# Patient Record
Sex: Female | Born: 1955
Health system: Southern US, Community
[De-identification: ages and names within clinical notes are randomized; demographics above are authoritative.]

## PROBLEM LIST (undated history)

## (undated) DIAGNOSIS — E785 Hyperlipidemia, unspecified: Secondary | ICD-10-CM

## (undated) DIAGNOSIS — F329 Major depressive disorder, single episode, unspecified: Secondary | ICD-10-CM

## (undated) DIAGNOSIS — E119 Type 2 diabetes mellitus without complications: Secondary | ICD-10-CM

## (undated) DIAGNOSIS — F32A Depression, unspecified: Secondary | ICD-10-CM

## (undated) DIAGNOSIS — C50911 Malignant neoplasm of unspecified site of right female breast: Secondary | ICD-10-CM

## (undated) DIAGNOSIS — I10 Essential (primary) hypertension: Secondary | ICD-10-CM

## (undated) DIAGNOSIS — E669 Obesity, unspecified: Secondary | ICD-10-CM

## (undated) DIAGNOSIS — T8859XA Other complications of anesthesia, initial encounter: Secondary | ICD-10-CM

## (undated) DIAGNOSIS — R569 Unspecified convulsions: Secondary | ICD-10-CM

## (undated) DIAGNOSIS — G43909 Migraine, unspecified, not intractable, without status migrainosus: Secondary | ICD-10-CM

## (undated) DIAGNOSIS — C50919 Malignant neoplasm of unspecified site of unspecified female breast: Secondary | ICD-10-CM

## (undated) DIAGNOSIS — K219 Gastro-esophageal reflux disease without esophagitis: Secondary | ICD-10-CM

## (undated) DIAGNOSIS — N189 Chronic kidney disease, unspecified: Secondary | ICD-10-CM

## (undated) HISTORY — DX: Essential (primary) hypertension: I10

## (undated) HISTORY — PX: ABDOMINAL HYSTERECTOMY: SHX81

## (undated) HISTORY — DX: Type 2 diabetes mellitus without complications: E11.9

## (undated) HISTORY — PX: BREAST LUMPECTOMY: SHX2

## (undated) HISTORY — DX: Obesity, unspecified: E66.9

## (undated) HISTORY — PX: FOOT SURGERY: SHX648

## (undated) HISTORY — PX: MASTECTOMY: SHX3

## (undated) HISTORY — DX: Malignant neoplasm of unspecified site of right female breast: C50.911

## (undated) HISTORY — DX: Hyperlipidemia, unspecified: E78.5

## (undated) HISTORY — PX: OTHER SURGICAL HISTORY: SHX169

## (undated) HISTORY — PX: CYST EXCISION: SHX5701

## (undated) HISTORY — DX: Chronic kidney disease, unspecified: N18.9

---

## 2007-11-30 ENCOUNTER — Encounter: Admission: RE | Admit: 2007-11-30 | Discharge: 2007-11-30 | Payer: Self-pay | Admitting: Internal Medicine

## 2007-12-11 ENCOUNTER — Encounter (INDEPENDENT_AMBULATORY_CARE_PROVIDER_SITE_OTHER): Payer: Self-pay | Admitting: Diagnostic Radiology

## 2007-12-11 ENCOUNTER — Encounter: Admission: RE | Admit: 2007-12-11 | Discharge: 2007-12-11 | Payer: Self-pay | Admitting: Internal Medicine

## 2007-12-18 DIAGNOSIS — Z853 Personal history of malignant neoplasm of breast: Secondary | ICD-10-CM | POA: Insufficient documentation

## 2007-12-18 DIAGNOSIS — Z170 Estrogen receptor positive status [ER+]: Secondary | ICD-10-CM | POA: Insufficient documentation

## 2007-12-18 DIAGNOSIS — C50511 Malignant neoplasm of lower-outer quadrant of right female breast: Secondary | ICD-10-CM | POA: Insufficient documentation

## 2007-12-18 DIAGNOSIS — C50911 Malignant neoplasm of unspecified site of right female breast: Secondary | ICD-10-CM

## 2007-12-18 HISTORY — DX: Malignant neoplasm of unspecified site of right female breast: C50.911

## 2007-12-21 ENCOUNTER — Encounter: Admission: RE | Admit: 2007-12-21 | Discharge: 2007-12-21 | Payer: Self-pay | Admitting: Internal Medicine

## 2008-01-08 ENCOUNTER — Encounter: Admission: RE | Admit: 2008-01-08 | Discharge: 2008-01-08 | Payer: Self-pay | Admitting: Surgery

## 2008-01-08 ENCOUNTER — Ambulatory Visit (HOSPITAL_COMMUNITY): Admission: RE | Admit: 2008-01-08 | Discharge: 2008-01-08 | Payer: Self-pay | Admitting: Surgery

## 2008-01-08 ENCOUNTER — Encounter (INDEPENDENT_AMBULATORY_CARE_PROVIDER_SITE_OTHER): Payer: Self-pay | Admitting: Surgery

## 2008-01-14 ENCOUNTER — Ambulatory Visit: Payer: Self-pay | Admitting: Oncology

## 2008-01-23 LAB — CBC WITH DIFFERENTIAL/PLATELET
BASO%: 0.8 % (ref 0.0–2.0)
Basophils Absolute: 0 10*3/uL (ref 0.0–0.1)
EOS%: 2.4 % (ref 0.0–7.0)
Eosinophils Absolute: 0.1 10*3/uL (ref 0.0–0.5)
HCT: 36.9 % (ref 34.8–46.6)
HGB: 13 g/dL (ref 11.6–15.9)
LYMPH%: 29.8 % (ref 14.0–48.0)
MCH: 29.9 pg (ref 26.0–34.0)
MCHC: 35.3 g/dL (ref 32.0–36.0)
MCV: 84.8 fL (ref 81.0–101.0)
MONO#: 0.2 10*3/uL (ref 0.1–0.9)
MONO%: 3.9 % (ref 0.0–13.0)
NEUT#: 2.7 10*3/uL (ref 1.5–6.5)
NEUT%: 63.1 % (ref 39.6–76.8)
Platelets: 266 10*3/uL (ref 145–400)
RBC: 4.36 10*6/uL (ref 3.70–5.32)
RDW: 12.9 % (ref 11.3–14.5)
WBC: 4.2 10*3/uL (ref 3.9–10.0)
lymph#: 1.3 10*3/uL (ref 0.9–3.3)

## 2008-01-24 LAB — COMPREHENSIVE METABOLIC PANEL
ALT: 13 U/L (ref 0–35)
AST: 11 U/L (ref 0–37)
Albumin: 4 g/dL (ref 3.5–5.2)
Alkaline Phosphatase: 67 U/L (ref 39–117)
BUN: 14 mg/dL (ref 6–23)
CO2: 23 mEq/L (ref 19–32)
Calcium: 8.9 mg/dL (ref 8.4–10.5)
Chloride: 108 mEq/L (ref 96–112)
Creatinine, Ser: 1 mg/dL (ref 0.40–1.20)
Glucose, Bld: 142 mg/dL — ABNORMAL HIGH (ref 70–99)
Potassium: 3.9 mEq/L (ref 3.5–5.3)
Sodium: 141 mEq/L (ref 135–145)
Total Bilirubin: 0.7 mg/dL (ref 0.3–1.2)
Total Protein: 7 g/dL (ref 6.0–8.3)

## 2008-01-24 LAB — CANCER ANTIGEN 27.29: CA 27.29: 30 U/mL (ref 0–39)

## 2008-01-24 LAB — LACTATE DEHYDROGENASE: LDH: 129 U/L (ref 94–250)

## 2008-01-24 LAB — VITAMIN D 25 HYDROXY (VIT D DEFICIENCY, FRACTURES): Vit D, 25-Hydroxy: 22 ng/mL — ABNORMAL LOW (ref 30–89)

## 2008-01-30 ENCOUNTER — Encounter: Admission: RE | Admit: 2008-01-30 | Discharge: 2008-01-30 | Payer: Self-pay | Admitting: Oncology

## 2008-02-05 ENCOUNTER — Ambulatory Visit (HOSPITAL_COMMUNITY): Admission: RE | Admit: 2008-02-05 | Discharge: 2008-02-05 | Payer: Self-pay | Admitting: Oncology

## 2008-02-12 ENCOUNTER — Ambulatory Visit: Admission: RE | Admit: 2008-02-12 | Discharge: 2008-04-29 | Payer: Self-pay | Admitting: Radiation Oncology

## 2008-04-15 ENCOUNTER — Ambulatory Visit: Payer: Self-pay | Admitting: Oncology

## 2008-04-18 LAB — CBC WITH DIFFERENTIAL/PLATELET
BASO%: 2.5 % — ABNORMAL HIGH (ref 0.0–2.0)
Basophils Absolute: 0.1 10*3/uL (ref 0.0–0.1)
EOS%: 3.9 % (ref 0.0–7.0)
Eosinophils Absolute: 0.1 10*3/uL (ref 0.0–0.5)
HCT: 38.7 % (ref 34.8–46.6)
HGB: 13.4 g/dL (ref 11.6–15.9)
LYMPH%: 33.3 % (ref 14.0–49.7)
MCH: 29.8 pg (ref 25.1–34.0)
MCHC: 34.5 g/dL (ref 31.5–36.0)
MCV: 86.1 fL (ref 79.5–101.0)
MONO#: 0.3 10*3/uL (ref 0.1–0.9)
MONO%: 8.2 % (ref 0.0–14.0)
NEUT#: 1.7 10*3/uL (ref 1.5–6.5)
NEUT%: 52.1 % (ref 38.4–76.8)
Platelets: 226 10*3/uL (ref 145–400)
RBC: 4.49 10*6/uL (ref 3.70–5.45)
RDW: 13.1 % (ref 11.2–14.5)
WBC: 3.2 10*3/uL — ABNORMAL LOW (ref 3.9–10.3)
lymph#: 1.1 10*3/uL (ref 0.9–3.3)

## 2008-04-18 LAB — COMPREHENSIVE METABOLIC PANEL
ALT: 16 U/L (ref 0–35)
AST: 15 U/L (ref 0–37)
Albumin: 4.1 g/dL (ref 3.5–5.2)
Alkaline Phosphatase: 67 U/L (ref 39–117)
BUN: 13 mg/dL (ref 6–23)
CO2: 27 mEq/L (ref 19–32)
Calcium: 9.4 mg/dL (ref 8.4–10.5)
Chloride: 104 mEq/L (ref 96–112)
Creatinine, Ser: 0.98 mg/dL (ref 0.40–1.20)
Glucose, Bld: 157 mg/dL — ABNORMAL HIGH (ref 70–99)
Potassium: 3.9 mEq/L (ref 3.5–5.3)
Sodium: 142 mEq/L (ref 135–145)
Total Bilirubin: 0.8 mg/dL (ref 0.3–1.2)
Total Protein: 7.2 g/dL (ref 6.0–8.3)

## 2008-05-22 ENCOUNTER — Encounter: Admission: RE | Admit: 2008-05-22 | Discharge: 2008-05-22 | Payer: Self-pay | Admitting: Radiation Oncology

## 2008-06-06 ENCOUNTER — Ambulatory Visit: Payer: Self-pay | Admitting: Oncology

## 2008-08-11 ENCOUNTER — Ambulatory Visit: Payer: Self-pay | Admitting: Oncology

## 2008-08-14 LAB — CBC WITH DIFFERENTIAL/PLATELET
BASO%: 0.5 % (ref 0.0–2.0)
Basophils Absolute: 0 10*3/uL (ref 0.0–0.1)
EOS%: 1.7 % (ref 0.0–7.0)
Eosinophils Absolute: 0 10*3/uL (ref 0.0–0.5)
HCT: 37.1 % (ref 34.8–46.6)
HGB: 13.1 g/dL (ref 11.6–15.9)
LYMPH%: 31.8 % (ref 14.0–49.7)
MCH: 30.1 pg (ref 25.1–34.0)
MCHC: 35.4 g/dL (ref 31.5–36.0)
MCV: 85 fL (ref 79.5–101.0)
MONO#: 0.2 10*3/uL (ref 0.1–0.9)
MONO%: 5.7 % (ref 0.0–14.0)
NEUT#: 1.6 10*3/uL (ref 1.5–6.5)
NEUT%: 60.3 % (ref 38.4–76.8)
Platelets: 236 10*3/uL (ref 145–400)
RBC: 4.36 10*6/uL (ref 3.70–5.45)
RDW: 12.5 % (ref 11.2–14.5)
WBC: 2.7 10*3/uL — ABNORMAL LOW (ref 3.9–10.3)
lymph#: 0.9 10*3/uL (ref 0.9–3.3)

## 2008-08-15 LAB — COMPREHENSIVE METABOLIC PANEL
ALT: 17 U/L (ref 0–35)
AST: 15 U/L (ref 0–37)
Albumin: 4.2 g/dL (ref 3.5–5.2)
Alkaline Phosphatase: 70 U/L (ref 39–117)
BUN: 10 mg/dL (ref 6–23)
CO2: 25 mEq/L (ref 19–32)
Calcium: 9.6 mg/dL (ref 8.4–10.5)
Chloride: 101 mEq/L (ref 96–112)
Creatinine, Ser: 1.02 mg/dL (ref 0.40–1.20)
Glucose, Bld: 197 mg/dL — ABNORMAL HIGH (ref 70–99)
Potassium: 3.8 mEq/L (ref 3.5–5.3)
Sodium: 140 mEq/L (ref 135–145)
Total Bilirubin: 1 mg/dL (ref 0.3–1.2)
Total Protein: 7 g/dL (ref 6.0–8.3)

## 2008-08-15 LAB — LACTATE DEHYDROGENASE: LDH: 160 U/L (ref 94–250)

## 2008-08-15 LAB — CANCER ANTIGEN 27.29: CA 27.29: 51 U/mL — ABNORMAL HIGH (ref 0–39)

## 2008-08-15 LAB — VITAMIN D 25 HYDROXY (VIT D DEFICIENCY, FRACTURES): Vit D, 25-Hydroxy: 30 ng/mL (ref 30–89)

## 2008-10-29 ENCOUNTER — Encounter: Admission: RE | Admit: 2008-10-29 | Discharge: 2008-10-29 | Payer: Self-pay | Admitting: Internal Medicine

## 2008-12-03 ENCOUNTER — Encounter: Admission: RE | Admit: 2008-12-03 | Discharge: 2008-12-03 | Payer: Self-pay | Admitting: Oncology

## 2008-12-16 ENCOUNTER — Ambulatory Visit: Payer: Self-pay | Admitting: Oncology

## 2008-12-17 ENCOUNTER — Emergency Department (HOSPITAL_COMMUNITY): Admission: EM | Admit: 2008-12-17 | Discharge: 2008-12-17 | Payer: Self-pay | Admitting: Family Medicine

## 2008-12-19 LAB — CBC WITH DIFFERENTIAL/PLATELET
BASO%: 0.6 % (ref 0.0–2.0)
Basophils Absolute: 0 10*3/uL (ref 0.0–0.1)
EOS%: 2.2 % (ref 0.0–7.0)
Eosinophils Absolute: 0.1 10*3/uL (ref 0.0–0.5)
HCT: 38.9 % (ref 34.8–46.6)
HGB: 13 g/dL (ref 11.6–15.9)
LYMPH%: 24.1 % (ref 14.0–49.7)
MCH: 29.2 pg (ref 25.1–34.0)
MCHC: 33.4 g/dL (ref 31.5–36.0)
MCV: 87.4 fL (ref 79.5–101.0)
MONO#: 0.3 10*3/uL (ref 0.1–0.9)
MONO%: 4.8 % (ref 0.0–14.0)
NEUT#: 3.7 10*3/uL (ref 1.5–6.5)
NEUT%: 68.3 % (ref 38.4–76.8)
Platelets: 216 10*3/uL (ref 145–400)
RBC: 4.45 10*6/uL (ref 3.70–5.45)
RDW: 13.2 % (ref 11.2–14.5)
WBC: 5.4 10*3/uL (ref 3.9–10.3)
lymph#: 1.3 10*3/uL (ref 0.9–3.3)

## 2008-12-20 LAB — LACTATE DEHYDROGENASE: LDH: 215 U/L (ref 94–250)

## 2008-12-20 LAB — COMPREHENSIVE METABOLIC PANEL
ALT: 16 U/L (ref 0–35)
AST: 19 U/L (ref 0–37)
Albumin: 4.4 g/dL (ref 3.5–5.2)
Alkaline Phosphatase: 69 U/L (ref 39–117)
BUN: 19 mg/dL (ref 6–23)
CO2: 28 mEq/L (ref 19–32)
Calcium: 9.7 mg/dL (ref 8.4–10.5)
Chloride: 101 mEq/L (ref 96–112)
Creatinine, Ser: 1.09 mg/dL (ref 0.40–1.20)
Glucose, Bld: 125 mg/dL — ABNORMAL HIGH (ref 70–99)
Potassium: 3.4 mEq/L — ABNORMAL LOW (ref 3.5–5.3)
Sodium: 140 mEq/L (ref 135–145)
Total Bilirubin: 0.8 mg/dL (ref 0.3–1.2)
Total Protein: 7.4 g/dL (ref 6.0–8.3)

## 2008-12-20 LAB — VITAMIN D 25 HYDROXY (VIT D DEFICIENCY, FRACTURES): Vit D, 25-Hydroxy: 31 ng/mL (ref 30–89)

## 2008-12-20 LAB — CANCER ANTIGEN 27.29: CA 27.29: 39 U/mL (ref 0–39)

## 2009-05-22 ENCOUNTER — Ambulatory Visit: Payer: Self-pay | Admitting: Oncology

## 2009-06-22 ENCOUNTER — Ambulatory Visit: Payer: Self-pay | Admitting: Oncology

## 2009-07-03 ENCOUNTER — Encounter: Admission: RE | Admit: 2009-07-03 | Discharge: 2009-07-03 | Payer: Self-pay | Admitting: Internal Medicine

## 2009-08-10 ENCOUNTER — Encounter: Admission: RE | Admit: 2009-08-10 | Discharge: 2009-08-10 | Payer: Self-pay | Admitting: Internal Medicine

## 2009-08-11 ENCOUNTER — Ambulatory Visit: Payer: Self-pay | Admitting: Oncology

## 2009-08-13 LAB — COMPREHENSIVE METABOLIC PANEL
ALT: 11 U/L (ref 0–35)
AST: 12 U/L (ref 0–37)
Albumin: 4.2 g/dL (ref 3.5–5.2)
Alkaline Phosphatase: 67 U/L (ref 39–117)
BUN: 20 mg/dL (ref 6–23)
CO2: 29 mEq/L (ref 19–32)
Calcium: 10.2 mg/dL (ref 8.4–10.5)
Chloride: 101 mEq/L (ref 96–112)
Creatinine, Ser: 1.11 mg/dL (ref 0.40–1.20)
Glucose, Bld: 108 mg/dL — ABNORMAL HIGH (ref 70–99)
Potassium: 4.1 mEq/L (ref 3.5–5.3)
Sodium: 141 mEq/L (ref 135–145)
Total Bilirubin: 0.8 mg/dL (ref 0.3–1.2)
Total Protein: 7.3 g/dL (ref 6.0–8.3)

## 2009-08-13 LAB — CBC WITH DIFFERENTIAL/PLATELET
BASO%: 0.9 % (ref 0.0–2.0)
Basophils Absolute: 0 10*3/uL (ref 0.0–0.1)
EOS%: 2.3 % (ref 0.0–7.0)
Eosinophils Absolute: 0.1 10*3/uL (ref 0.0–0.5)
HCT: 36.8 % (ref 34.8–46.6)
HGB: 12.8 g/dL (ref 11.6–15.9)
LYMPH%: 35.2 % (ref 14.0–49.7)
MCH: 30.2 pg (ref 25.1–34.0)
MCHC: 34.7 g/dL (ref 31.5–36.0)
MCV: 87 fL (ref 79.5–101.0)
MONO#: 0.2 10*3/uL (ref 0.1–0.9)
MONO%: 6 % (ref 0.0–14.0)
NEUT#: 2.3 10*3/uL (ref 1.5–6.5)
NEUT%: 55.6 % (ref 38.4–76.8)
Platelets: 271 10*3/uL (ref 145–400)
RBC: 4.24 10*6/uL (ref 3.70–5.45)
RDW: 13.8 % (ref 11.2–14.5)
WBC: 4.2 10*3/uL (ref 3.9–10.3)
lymph#: 1.5 10*3/uL (ref 0.9–3.3)

## 2009-08-13 LAB — LACTATE DEHYDROGENASE: LDH: 151 U/L (ref 94–250)

## 2009-08-13 LAB — CANCER ANTIGEN 27.29: CA 27.29: 38 U/mL (ref 0–39)

## 2009-08-13 LAB — VITAMIN D 25 HYDROXY (VIT D DEFICIENCY, FRACTURES): Vit D, 25-Hydroxy: 43 ng/mL (ref 30–89)

## 2009-08-18 ENCOUNTER — Ambulatory Visit (HOSPITAL_COMMUNITY): Admission: RE | Admit: 2009-08-18 | Discharge: 2009-08-18 | Payer: Self-pay | Admitting: Oncology

## 2009-08-18 ENCOUNTER — Encounter: Admission: RE | Admit: 2009-08-18 | Discharge: 2009-08-18 | Payer: Self-pay | Admitting: Oncology

## 2010-04-27 ENCOUNTER — Other Ambulatory Visit: Payer: Self-pay | Admitting: Internal Medicine

## 2010-04-27 DIAGNOSIS — Z853 Personal history of malignant neoplasm of breast: Secondary | ICD-10-CM

## 2010-04-27 DIAGNOSIS — N644 Mastodynia: Secondary | ICD-10-CM

## 2010-04-27 DIAGNOSIS — Z1231 Encounter for screening mammogram for malignant neoplasm of breast: Secondary | ICD-10-CM

## 2010-04-29 ENCOUNTER — Other Ambulatory Visit: Payer: Self-pay | Admitting: Internal Medicine

## 2010-04-29 DIAGNOSIS — N644 Mastodynia: Secondary | ICD-10-CM

## 2010-04-29 DIAGNOSIS — Z853 Personal history of malignant neoplasm of breast: Secondary | ICD-10-CM

## 2010-05-05 ENCOUNTER — Ambulatory Visit
Admission: RE | Admit: 2010-05-05 | Discharge: 2010-05-05 | Disposition: A | Discharge status: Home / Self Care | Payer: BC Managed Care – PPO | Source: Ambulatory Visit | Attending: Internal Medicine | Admitting: Internal Medicine

## 2010-05-05 ENCOUNTER — Other Ambulatory Visit: Payer: Self-pay | Admitting: Internal Medicine

## 2010-05-05 DIAGNOSIS — N644 Mastodynia: Secondary | ICD-10-CM

## 2010-05-05 DIAGNOSIS — Z853 Personal history of malignant neoplasm of breast: Secondary | ICD-10-CM

## 2010-06-01 NOTE — Op Note (Signed)
[name], [name]            ACCOUNT NO.:  [id number]   MEDICAL RECORD NO.:  [id number]          PATIENT TYPE:  AMB   LOCATION:  SDS                          FACILITY:  [facility]   PHYSICIAN:  Sandria Bales. Ezzard Standing, M.D.  DATE OF BIRTH:  07-30-1955   DATE OF PROCEDURE:  01/08/2008  DATE OF DISCHARGE:  01/08/2008                               OPERATIVE REPORT   Date of Surgery ??   PREOPERATIVE DIAGNOSIS:  Right breast carcinoma approximately 8-9  o'clock position at the edge of the areola.   POSTOPERATIVE DIAGNOSIS:  Right breast carcinoma, 8-9 o'clock position  at edge of the areola, negative sentinel lymph node.   PROCEDURE:  Injection of methylene blue, right axillary sentinel lymph  node biopsy, needle localization, right breast lumpectomy.   SURGEON:  Sandria Bales. Ezzard Standing, MD   FIRST ASSISTANT:  None.   ANESTHESIA:  General endotracheal.   ESTIMATED BLOOD LOSS:  100 mL.   DRAINS:  Drains left in were none.   INDICATIONS FOR PROCEDURE:  Carrie Singh is a 55 year old white female,  patient of Dr. Dorothyann Singh, who had a large core biopsy of a mass on  the right breast, which proved to show an infiltrating ductal carcinoma.  This was done by Dr. Cain Singh.   I discussed with the patient the options for treatment, which include  lumpectomy versus mastectomy.  I think she is a candidate for a  lumpectomy.  I discussed with her the indications and potential  complications.  Potential complications include, but not limited to,  bleeding, infection, recurrence of the tumor, and the need for wider  excision.   OPERATIVE NOTE:  The patient had a wire placed in her right breast for  wire localization.  This was done at the breast center.  Her wire came  out at about 6 o'clock position.   She underwent an injection of 1 mCi of technetium sulfur colloid in the  periareolar area.  She then presented to the operating room where breast  was prepped with chlorhexidine and sterilely  draped and then I injected  about 1 mL of 40% methylene blue in a subareolar fashion.   A time-out was held to identifying the patient and procedure.  I had  marked her right breast already.  Lower on the right axilla.  I made an  incision lower in the right axilla.  I got down, I found  a warm node,  counts were about 5 to 8 with a background of 0, and the node was not  blue.  I sent this off for a touch prep of the lymph node was negative.  She had no other palpable adenopathy or abnormality within her axilla.  I did take out a second piece of axillary fat that may have a lymph node  I sent it out as a separate specimen.   Then turned my attention to the right breast as particularly.  Again,  the wire came out at the right breast, but the mass was palpable off the  edge of the areola at about the 8-9 o'clock position.  So, I cut  down  and tried to excise a block of breast tissue around this mass.  I went  down to the chest wall.  I did mark the specimen with the ink kit to  orient the anterior and posterior and lateral wall margins.  The wire  emanated from the inferior aspect of this mass.  Dr. Anselmo Singh  called me and said that the specimen appeared to be within the breast  tissue mass for an adequate lumpectomy.   I then irrigated the wound with saline, closed the subcutaneous tissues  with 3-0 Vicryl suture, the skin with a 5-0 Monocryl suture.  I did the  same with the right axilla.  I did use 30 mL of 0.25% Marcaine as a  local anesthetic.   The patient will be discharged home today, return to see me in 2 weeks  for followup of her pathology.      Sandria Bales. Ezzard Standing, M.D.  Electronically Signed     DHN/MEDQ  D:  01/08/2008  T:  01/09/2008  Job:  [id number]   cc:   Carrie Singh, M.D.  Carrie Singh, M.D.

## 2010-10-22 LAB — COMPREHENSIVE METABOLIC PANEL
ALT: 17 U/L (ref 0–35)
AST: 20 U/L (ref 0–37)
Albumin: 3.9 g/dL (ref 3.5–5.2)
Alkaline Phosphatase: 73 U/L (ref 39–117)
BUN: 17 mg/dL (ref 6–23)
CO2: 34 mEq/L — ABNORMAL HIGH (ref 19–32)
Calcium: 9.5 mg/dL (ref 8.4–10.5)
Chloride: 100 mEq/L (ref 96–112)
Creatinine, Ser: 0.97 mg/dL (ref 0.4–1.2)
GFR calc Af Amer: >60 mL/min (ref >60)
GFR calc non Af Amer: 60 mL/min — ABNORMAL LOW (ref >60)
Glucose, Bld: 165 mg/dL — ABNORMAL HIGH (ref 70–99)
Potassium: 3.8 mEq/L (ref 3.5–5.1)
Sodium: 137 mEq/L (ref 135–145)
Total Bilirubin: 1.2 mg/dL (ref 0.3–1.2)
Total Protein: 7.4 g/dL (ref 6.0–8.3)

## 2010-10-22 LAB — DIFFERENTIAL
Basophils Absolute: 0 10*3/uL (ref 0.0–0.1)
Basophils Relative: 0 % (ref 0–1)
Eosinophils Absolute: 0.1 10*3/uL (ref 0.0–0.7)
Eosinophils Relative: 2 % (ref 0–5)
Lymphocytes Relative: 31 % (ref 12–46)
Lymphs Abs: 1.6 10*3/uL (ref 0.7–4.0)
Monocytes Absolute: 0.3 10*3/uL (ref 0.1–1.0)
Monocytes Relative: 5 % (ref 3–12)
Neutro Abs: 3.2 10*3/uL (ref 1.7–7.7)
Neutrophils Relative %: 62 % (ref 43–77)

## 2010-10-22 LAB — CBC
HCT: 39.5 % (ref 36.0–46.0)
Hemoglobin: 13.4 g/dL (ref 12.0–15.0)
MCHC: 33.9 g/dL (ref 30.0–36.0)
MCV: 86.8 fL (ref 78.0–100.0)
Platelets: 282 10*3/uL (ref 150–400)
RBC: 4.55 MIL/uL (ref 3.87–5.11)
RDW: 12.9 % (ref 11.5–15.5)
WBC: 5.2 10*3/uL (ref 4.0–10.5)

## 2010-10-22 LAB — LACTATE DEHYDROGENASE: LDH: 165 U/L (ref 94–250)

## 2010-11-01 ENCOUNTER — Other Ambulatory Visit: Payer: Self-pay | Admitting: Internal Medicine

## 2010-11-01 DIAGNOSIS — Z9889 Other specified postprocedural states: Secondary | ICD-10-CM

## 2010-11-01 DIAGNOSIS — C50919 Malignant neoplasm of unspecified site of unspecified female breast: Secondary | ICD-10-CM

## 2010-12-13 ENCOUNTER — Ambulatory Visit
Admission: RE | Admit: 2010-12-13 | Discharge: 2010-12-13 | Disposition: A | Discharge status: Home / Self Care | Payer: BC Managed Care – PPO | Source: Ambulatory Visit | Attending: Internal Medicine | Admitting: Internal Medicine

## 2010-12-13 DIAGNOSIS — C50919 Malignant neoplasm of unspecified site of unspecified female breast: Secondary | ICD-10-CM

## 2010-12-13 DIAGNOSIS — Z9889 Other specified postprocedural states: Secondary | ICD-10-CM

## 2010-12-23 ENCOUNTER — Other Ambulatory Visit: Payer: Self-pay | Admitting: Oncology

## 2010-12-23 DIAGNOSIS — C50419 Malignant neoplasm of upper-outer quadrant of unspecified female breast: Secondary | ICD-10-CM

## 2010-12-24 ENCOUNTER — Telehealth: Payer: Self-pay | Admitting: *Deleted

## 2010-12-24 ENCOUNTER — Other Ambulatory Visit: Payer: Self-pay | Admitting: Physician Assistant

## 2010-12-24 DIAGNOSIS — C50919 Malignant neoplasm of unspecified site of unspecified female breast: Secondary | ICD-10-CM

## 2010-12-24 NOTE — Telephone Encounter (Signed)
left voice message to inform the patient of the new date and time on 12-29-2010 starting at 3:00pm

## 2010-12-24 NOTE — Telephone Encounter (Signed)
patient called back and stated she called in about a month ago and requested a physician change patient does not want to come back and see dr.rubin

## 2010-12-29 ENCOUNTER — Other Ambulatory Visit: Payer: BC Managed Care – PPO | Admitting: Lab

## 2010-12-29 ENCOUNTER — Ambulatory Visit: Payer: BC Managed Care – PPO | Admitting: Oncology

## 2011-02-09 LAB — HM COLONOSCOPY

## 2011-03-17 ENCOUNTER — Other Ambulatory Visit: Payer: Self-pay | Admitting: Physician Assistant

## 2011-09-02 ENCOUNTER — Other Ambulatory Visit: Payer: Self-pay | Admitting: Internal Medicine

## 2011-09-12 ENCOUNTER — Ambulatory Visit
Admission: RE | Admit: 2011-09-12 | Discharge: 2011-09-12 | Disposition: A | Discharge status: Home / Self Care | Payer: BC Managed Care – PPO | Source: Ambulatory Visit | Attending: Internal Medicine | Admitting: Internal Medicine

## 2011-09-12 ENCOUNTER — Other Ambulatory Visit: Payer: Self-pay | Admitting: Internal Medicine

## 2011-09-12 DIAGNOSIS — N6310 Unspecified lump in the right breast, unspecified quadrant: Secondary | ICD-10-CM

## 2011-09-12 DIAGNOSIS — N644 Mastodynia: Secondary | ICD-10-CM

## 2011-09-14 ENCOUNTER — Other Ambulatory Visit: Payer: BC Managed Care – PPO

## 2011-12-09 ENCOUNTER — Other Ambulatory Visit: Payer: Self-pay | Admitting: Internal Medicine

## 2011-12-09 DIAGNOSIS — Z853 Personal history of malignant neoplasm of breast: Secondary | ICD-10-CM

## 2011-12-20 ENCOUNTER — Ambulatory Visit
Admission: RE | Admit: 2011-12-20 | Discharge: 2011-12-20 | Disposition: A | Discharge status: Home / Self Care | Payer: BC Managed Care – PPO | Source: Ambulatory Visit | Attending: Internal Medicine | Admitting: Internal Medicine

## 2011-12-20 DIAGNOSIS — Z853 Personal history of malignant neoplasm of breast: Secondary | ICD-10-CM

## 2012-06-27 ENCOUNTER — Other Ambulatory Visit: Payer: Self-pay | Admitting: Nurse Practitioner

## 2012-06-27 DIAGNOSIS — N2889 Other specified disorders of kidney and ureter: Secondary | ICD-10-CM

## 2012-07-06 ENCOUNTER — Ambulatory Visit
Admission: RE | Admit: 2012-07-06 | Discharge: 2012-07-06 | Disposition: A | Discharge status: Home / Self Care | Payer: BC Managed Care – PPO | Source: Ambulatory Visit | Attending: Nurse Practitioner | Admitting: Nurse Practitioner

## 2012-07-06 DIAGNOSIS — N2889 Other specified disorders of kidney and ureter: Secondary | ICD-10-CM

## 2012-07-06 MED ORDER — GADOBENATE DIMEGLUMINE 529 MG/ML IV SOLN
20.0000 mL | Freq: Once | INTRAVENOUS | Status: AC | PRN
  Administered 2012-07-06: 20 mL via INTRAVENOUS

## 2012-11-16 ENCOUNTER — Other Ambulatory Visit: Payer: Self-pay

## 2012-11-16 ENCOUNTER — Other Ambulatory Visit: Payer: Self-pay | Admitting: Internal Medicine

## 2012-11-16 DIAGNOSIS — Z853 Personal history of malignant neoplasm of breast: Secondary | ICD-10-CM

## 2012-12-06 ENCOUNTER — Ambulatory Visit (INDEPENDENT_AMBULATORY_CARE_PROVIDER_SITE_OTHER): Payer: BC Managed Care – PPO | Admitting: Podiatry

## 2012-12-06 ENCOUNTER — Encounter: Payer: Self-pay | Admitting: Podiatry

## 2012-12-06 ENCOUNTER — Ambulatory Visit (INDEPENDENT_AMBULATORY_CARE_PROVIDER_SITE_OTHER): Payer: BC Managed Care – PPO

## 2012-12-06 VITALS — BP 162/95 | HR 86 | Resp 16 | Ht 65.0 in

## 2012-12-06 DIAGNOSIS — M25579 Pain in unspecified ankle and joints of unspecified foot: Secondary | ICD-10-CM

## 2012-12-06 DIAGNOSIS — M79609 Pain in unspecified limb: Secondary | ICD-10-CM

## 2012-12-06 DIAGNOSIS — M79671 Pain in right foot: Secondary | ICD-10-CM

## 2012-12-06 DIAGNOSIS — M25572 Pain in left ankle and joints of left foot: Secondary | ICD-10-CM

## 2012-12-06 DIAGNOSIS — M79672 Pain in left foot: Secondary | ICD-10-CM

## 2012-12-06 DIAGNOSIS — M779 Enthesopathy, unspecified: Secondary | ICD-10-CM

## 2012-12-06 DIAGNOSIS — S93409A Sprain of unspecified ligament of unspecified ankle, initial encounter: Secondary | ICD-10-CM

## 2012-12-06 MED ORDER — TRIAMCINOLONE ACETONIDE 10 MG/ML IJ SUSP
5.0000 mg | Freq: Once | INTRAMUSCULAR | Status: AC
  Administered 2012-12-06: 5 mg via INTRA_ARTICULAR

## 2012-12-06 NOTE — Patient Instructions (Signed)
Diabetes and Foot Care Diabetes may cause you to have problems because of poor blood supply (circulation) to your feet and legs. This may cause the skin on your feet to become thinner, break easier, and heal more slowly. Your skin may become dry, and the skin may peel and crack. You may also have nerve damage in your legs and feet causing decreased feeling in them. You may not notice minor injuries to your feet that could lead to infections or more serious problems. Taking care of your feet is one of the most important things you can do for yourself.  HOME CARE INSTRUCTIONS  Wear shoes at all times, even in the house. Do not go barefoot. Bare feet are easily injured.  Check your feet daily for blisters, cuts, and redness. If you cannot see the bottom of your feet, use a mirror or ask someone for help.  Wash your feet with warm water (do not use hot water) and mild soap. Then pat your feet and the areas between your toes until they are completely dry. Do not soak your feet as this can dry your skin.  Apply a moisturizing lotion or petroleum jelly (that does not contain alcohol and is unscented) to the skin on your feet and to dry, brittle toenails. Do not apply lotion between your toes.  Trim your toenails straight across. Do not dig under them or around the cuticle. File the edges of your nails with an emery board or nail file.  Do not cut corns or calluses or try to remove them with medicine.  Wear clean socks or stockings every day. Make sure they are not too tight. Do not wear knee-high stockings since they may decrease blood flow to your legs.  Wear shoes that fit properly and have enough cushioning. To break in new shoes, wear them for just a few hours a day. This prevents you from injuring your feet. Always look in your shoes before you put them on to be sure there are no objects inside.  Do not cross your legs. This may decrease the blood flow to your feet.  If you find a minor scrape,  cut, or break in the skin on your feet, keep it and the skin around it clean and dry. These areas may be cleansed with mild soap and water. Do not cleanse the area with peroxide, alcohol, or iodine.  When you remove an adhesive bandage, be sure not to damage the skin around it.  If you have a wound, look at it several times a day to make sure it is healing.  Do not use heating pads or hot water bottles. They may burn your skin. If you have lost feeling in your feet or legs, you may not know it is happening until it is too late.  Make sure your health care provider performs a complete foot exam at least annually or more often if you have foot problems. Report any cuts, sores, or bruises to your health care provider immediately. SEEK MEDICAL CARE IF:   You have an injury that is not healing.  You have cuts or breaks in the skin.  You have an ingrown nail.  You notice redness on your legs or feet.  You feel burning or tingling in your legs or feet.  You have pain or cramps in your legs and feet.  Your legs or feet are numb.  Your feet always feel cold. SEEK IMMEDIATE MEDICAL CARE IF:   There is increasing redness,   swelling, or pain in or around a wound.  There is a red line that goes up your leg.  Pus is coming from a wound.  You develop a fever or as directed by your health care provider.  You notice a bad smell coming from an ulcer or wound. Document Released: 01/01/2000 Document Revised: 09/05/2012 Document Reviewed: 06/12/2012 [employer] Patient Information 2014 [employer].  

## 2012-12-06 NOTE — Progress Notes (Signed)
Subjective:     Patient ID: Carrie Singh, female   DOB: [birth year], 57 y.o.   MRN: [id number]  Foot Pain  Toe Pain    patient presents stating him having a lot of pain in my left ankle and my right foot has been somewhat sore not like the left at this time. Patient states she's tried exercise to keep her diabetes under good control and complaint her right big toe has been somewhat not   Review of Systems  All other systems reviewed and are negative.       Objective:   Physical Exam  Nursing note and vitals reviewed. Constitutional: She is oriented to person, place, and time.  Cardiovascular: Intact distal pulses.   Musculoskeletal: Normal range of motion.  Neurological: She is oriented to person, place, and time.  Skin: Skin is warm.   patient has pain in the sinus tarsi left and into the lateral ankle gutter. Neurovascular status intact with muscle strength adequate and no equinus condition noted did not note any pathology with the right big toe     Assessment:     Sinus tarsitis left with inflammation of the ankle and probable ankle sprain. Mild numbness right big toe with no organic cause    Plan:     H&P and x-rays reviewed of foot ankle left foot right. Injected the left sinus tarsi 3 minus Kenalog 5 mg Xylocaine Marcaine mixture and dispensed tri-lock ankle brace to support the ankle and reappoint as needed

## 2012-12-10 ENCOUNTER — Ambulatory Visit: Payer: Self-pay | Admitting: Podiatry

## 2012-12-20 ENCOUNTER — Encounter: Payer: Self-pay | Admitting: Podiatry

## 2012-12-20 ENCOUNTER — Ambulatory Visit (INDEPENDENT_AMBULATORY_CARE_PROVIDER_SITE_OTHER): Payer: BC Managed Care – PPO | Admitting: Podiatry

## 2012-12-20 ENCOUNTER — Ambulatory Visit
Admission: RE | Admit: 2012-12-20 | Discharge: 2012-12-20 | Disposition: A | Discharge status: Home / Self Care | Payer: BC Managed Care – PPO | Source: Ambulatory Visit | Attending: Internal Medicine | Admitting: Internal Medicine

## 2012-12-20 ENCOUNTER — Ambulatory Visit: Payer: Self-pay | Admitting: Podiatry

## 2012-12-20 VITALS — BP 157/99 | HR 85 | Resp 16

## 2012-12-20 DIAGNOSIS — Z853 Personal history of malignant neoplasm of breast: Secondary | ICD-10-CM

## 2012-12-20 DIAGNOSIS — M779 Enthesopathy, unspecified: Secondary | ICD-10-CM

## 2012-12-20 NOTE — Progress Notes (Signed)
Subjective:     Patient ID: Carrie Singh, female   DOB: September 05, 1955, 57 y.o.   MRN: [id number]  HPI patient states is doing much better my left foot and able to walk with the brace   Review of Systems     Objective:   Physical Exam    neurovascular status intact with no changes noted and discomfort of the left sinus tarsi which has improved quite a bit from previous visit Assessment:    has improved quite a bit from previous visit    improve sinus tarsitis left with brace being helpful Plan:     Advised him reduced brace and gradually increasing the strength of the ankle and continue ambulation as she has been doing. Reappoint as needed

## 2013-04-04 ENCOUNTER — Ambulatory Visit: Payer: BC Managed Care – PPO | Admitting: Family Medicine

## 2013-04-24 ENCOUNTER — Ambulatory Visit (INDEPENDENT_AMBULATORY_CARE_PROVIDER_SITE_OTHER): Payer: BC Managed Care – PPO | Admitting: Internal Medicine

## 2013-04-24 ENCOUNTER — Telehealth: Payer: Self-pay | Admitting: Internal Medicine

## 2013-04-24 ENCOUNTER — Encounter: Payer: Self-pay | Admitting: Internal Medicine

## 2013-04-24 VITALS — BP 136/88 | HR 72 | Temp 98.1°F | Ht 65.25 in | Wt 217.2 lb

## 2013-04-24 DIAGNOSIS — E049 Nontoxic goiter, unspecified: Secondary | ICD-10-CM

## 2013-04-24 DIAGNOSIS — E119 Type 2 diabetes mellitus without complications: Secondary | ICD-10-CM | POA: Insufficient documentation

## 2013-04-24 DIAGNOSIS — I10 Essential (primary) hypertension: Secondary | ICD-10-CM

## 2013-04-24 DIAGNOSIS — F329 Major depressive disorder, single episode, unspecified: Secondary | ICD-10-CM

## 2013-04-24 DIAGNOSIS — E669 Obesity, unspecified: Secondary | ICD-10-CM | POA: Insufficient documentation

## 2013-04-24 DIAGNOSIS — F3289 Other specified depressive episodes: Secondary | ICD-10-CM

## 2013-04-24 DIAGNOSIS — E010 Iodine-deficiency related diffuse (endemic) goiter: Secondary | ICD-10-CM

## 2013-04-24 DIAGNOSIS — E785 Hyperlipidemia, unspecified: Secondary | ICD-10-CM

## 2013-04-24 DIAGNOSIS — E1169 Type 2 diabetes mellitus with other specified complication: Secondary | ICD-10-CM | POA: Insufficient documentation

## 2013-04-24 DIAGNOSIS — F32A Depression, unspecified: Secondary | ICD-10-CM

## 2013-04-24 LAB — MICROALBUMIN / CREATININE URINE RATIO
Creatinine,U: 39.5 mg/dL
Microalb Creat Ratio: 0.5 mg/g (ref 0.0–30.0)
Microalb, Ur: 0.2 mg/dL (ref 0.0–1.9)

## 2013-04-24 LAB — COMPREHENSIVE METABOLIC PANEL
ALT: 19 U/L (ref 0–35)
AST: 16 U/L (ref 0–37)
Albumin: 3.9 g/dL (ref 3.5–5.2)
Alkaline Phosphatase: 70 U/L (ref 39–117)
BUN: 16 mg/dL (ref 6–23)
CO2: 30 mEq/L (ref 19–32)
Calcium: 9.2 mg/dL (ref 8.4–10.5)
Chloride: 100 mEq/L (ref 96–112)
Creatinine, Ser: 1 mg/dL (ref 0.4–1.2)
GFR: 76.25 mL/min (ref >60.00)
Glucose, Bld: 120 mg/dL — ABNORMAL HIGH (ref 70–99)
Potassium: 3.9 mEq/L (ref 3.5–5.1)
Sodium: 136 mEq/L (ref 135–145)
Total Bilirubin: 0.9 mg/dL (ref 0.3–1.2)
Total Protein: 7.2 g/dL (ref 6.0–8.3)

## 2013-04-24 LAB — LIPID PANEL
Cholesterol: 239 mg/dL — ABNORMAL HIGH (ref 0–200)
HDL: 52.5 mg/dL (ref >39.00)
LDL Cholesterol: 148 mg/dL — ABNORMAL HIGH (ref 0–99)
Total CHOL/HDL Ratio: 5
Triglycerides: 195 mg/dL — ABNORMAL HIGH (ref 0.0–149.0)
VLDL: 39 mg/dL (ref 0.0–40.0)

## 2013-04-24 LAB — CBC
HCT: 38.8 % (ref 36.0–46.0)
Hemoglobin: 13.2 g/dL (ref 12.0–15.0)
MCHC: 34 g/dL (ref 30.0–36.0)
MCV: 85.4 fl (ref 78.0–100.0)
Platelets: 273 10*3/uL (ref 150.0–400.0)
RBC: 4.54 Mil/uL (ref 3.87–5.11)
RDW: 13.8 % (ref 11.5–14.6)
WBC: 4.7 10*3/uL (ref 4.5–10.5)

## 2013-04-24 LAB — HEMOGLOBIN A1C: Hgb A1c MFr Bld: 7.8 % — ABNORMAL HIGH (ref 4.6–6.5)

## 2013-04-24 LAB — TSH: TSH: 1.67 u[IU]/mL (ref 0.35–5.50)

## 2013-04-24 NOTE — Assessment & Plan Note (Signed)
She has been working on weight loss Will recheck A1C and microalbumin today Foot exam today She would like to switch the [place] to another drug- will check A1C first

## 2013-04-24 NOTE — Telephone Encounter (Signed)
Relevant patient education assigned to patient using Emmi. ° °

## 2013-04-24 NOTE — Telephone Encounter (Signed)
Pt is calling back and wanted to tell you her other medications with dosage. Januvia 100 mg and Pravastatin 20 mg.

## 2013-04-24 NOTE — Assessment & Plan Note (Signed)
She has been walking and watching her diet She is losing about 0.5-1 lb per week

## 2013-04-24 NOTE — Assessment & Plan Note (Signed)
Situation Support offered She does not want treatment at this time.

## 2013-04-24 NOTE — Progress Notes (Signed)
Pre visit review using our clinic review tool, if applicable. No additional management support is needed unless otherwise documented below in the visit note. 

## 2013-04-24 NOTE — Assessment & Plan Note (Signed)
Will recheck lipid profile and CMET today

## 2013-04-24 NOTE — Assessment & Plan Note (Signed)
Will check CMET and CBC today Will refill medications once she calls back to let me know the doses

## 2013-04-24 NOTE — Patient Instructions (Addendum)

## 2013-04-24 NOTE — Progress Notes (Signed)
HPI  Carrie Singh presents to the clinic today to establish care. She is transferring care from [facility], Dr. Baird Cancer. She has not had a physical in the last year. She has no concern today.  Flu: never Tetanus: 2010 Pneumovax: never Prevnar: never Pap Smear: 2013 Mammogram: 2014 Colonoscopy: 2013 Bone Density: 2009 Eye Doctor: yearly Dentist: 2013  Past Medical History  Diagnosis Date  . Diabetes mellitus without complication   . Hypertension   . Hyperlipidemia   . Cancer     Breast    Current Outpatient Prescriptions  Medication Sig Dispense Refill  . hydrochlorothiazide (MICROZIDE) 12.5 MG capsule Take 12.5 mg by mouth daily.      Marland Kitchen LISINOPRIL PO Take 1 capsule by mouth daily.      Marland Kitchen NIFEdipine (NIFEDICAL XL PO) Take by mouth.      Marland Kitchen PRAVASTATIN SODIUM PO Take by mouth.      . SitaGLIPtin Phosphate (JANUVIA PO) Take by mouth.       No current facility-administered medications for this visit.    No Known Allergies  Family History  Problem Relation Age of Onset  . Stroke Mother   . Heart disease Father   . Asthma Father     History   Social History  . Marital Status: Married    Spouse Name: N/A    Number of Children: N/A  . Years of Education: N/A   Occupational History  . Not on file.   Social History Main Topics  . Smoking status: Never Smoker   . Smokeless tobacco: Not on file  . Alcohol Use: No  . Drug Use: Not on file  . Sexual Activity: Not on file   Other Topics Concern  . Not on file   Social History Narrative  . No narrative on file    ROS:  Constitutional: Denies fever, malaise, fatigue, headache or abrupt weight changes.  HEENT: Denies eye pain, eye redness, ear pain, ringing in the ears, wax buildup, runny nose, nasal congestion, bloody nose, or sore throat. Respiratory: Denies difficulty breathing, shortness of breath, cough or sputum production.   Cardiovascular: Denies chest pain, chest tightness, palpitations or swelling  in the hands or feet.  Skin: Denies redness, rashes, lesions or ulcercations.  Neurological: Denies dizziness, difficulty with memory, difficulty with speech or problems with balance and coordination.  Psych: Carrie Singh reports depression. Denies anxiety, SI/HI.  No other specific complaints in a complete review of systems (except as listed in HPI above).  PE:  BP 136/88  Pulse 72  Temp(Src) 98.1 F (36.7 C) (Oral)  Ht 5' 5.25" (1.657 m)  Wt 217 lb 4 oz (98.544 kg)  BMI 35.89 kg/m2  SpO2 98% Wt Readings from Last 3 Encounters:  04/24/13 217 lb 4 oz (98.544 kg)    General: Appears her stated age, obese but well developed, well nourished in NAD. HEENT: Head: normal shape and size; Eyes: sclera white, no icterus, conjunctiva pink, PERRLA and EOMs intact; Ears: Tm's gray and intact, normal light reflex; Nose: mucosa pink and moist, septum midline; Throat/Mouth: Teeth present, mucosa pink and moist, no lesions or ulcerations noted.  Neck: Normal range of motion. Neck supple, trachea midline. No massses, lumps or thyromegaly present.  Cardiovascular: Normal rate and rhythm. S1,S2 noted.  No murmur, rubs or gallops noted. No JVD or BLE edema. No carotid bruits noted. Pulmonary/Chest: Normal effort and positive vesicular breath sounds. No respiratory distress. No wheezes, rales or ronchi noted.  Neurological: Alert and oriented. Sensation  intact to 10 gm monofilament. Coordination normal. +DTRs bilaterally. Psychiatric: Mood and affect normal. Behavior is normal. Judgment and thought content normal.     BMET    Component Value Date/Time   NA 141 08/13/2009 0946   K 4.1 08/13/2009 0946   CL 101 08/13/2009 0946   CO2 29 08/13/2009 0946   GLUCOSE 108* 08/13/2009 0946   BUN 20 08/13/2009 0946   CREATININE 1.11 08/13/2009 0946   CALCIUM 10.2 08/13/2009 0946   GFRNONAA 60* 01/04/2008 1001   GFRAA  Value: >60        The eGFR has been calculated using the MDRD equation. This calculation has not been  validated in all clinical 01/04/2008 1001    Lipid Panel  No results found for this basename: chol, trig, hdl, cholhdl, vldl, ldlcalc    CBC    Component Value Date/Time   WBC 4.2 08/13/2009 0946   WBC 5.2 01/04/2008 1001   RBC 4.24 08/13/2009 0946   RBC 4.55 01/04/2008 1001   HGB 12.8 08/13/2009 0946   HGB 13.4 01/04/2008 1001   HCT 36.8 08/13/2009 0946   HCT 39.5 01/04/2008 1001   PLT 271 08/13/2009 0946   PLT 282 01/04/2008 1001   MCV 87.0 08/13/2009 0946   MCV 86.8 01/04/2008 1001   MCH 30.2 08/13/2009 0946   MCHC 34.7 08/13/2009 0946   MCHC 33.9 01/04/2008 1001   RDW 13.8 08/13/2009 0946   RDW 12.9 01/04/2008 1001   LYMPHSABS 1.5 08/13/2009 0946   LYMPHSABS 1.6 01/04/2008 1001   MONOABS 0.2 08/13/2009 0946   MONOABS 0.3 01/04/2008 1001   EOSABS 0.1 08/13/2009 0946   EOSABS 0.1 01/04/2008 1001   BASOSABS 0.0 08/13/2009 0946   BASOSABS 0.0 01/04/2008 1001    Hgb A1C No results found for this basename: HGBA1C     Assessment and Plan:

## 2013-05-02 ENCOUNTER — Telehealth: Payer: Self-pay

## 2013-05-02 NOTE — Telephone Encounter (Signed)
Left detailed msg on VM for pt to return call

## 2013-05-02 NOTE — Telephone Encounter (Signed)
A1C was elevated at 7.8. I know she wants to get off [place]. Does she know what other meds she has tried in the past? Metformin, glipizide?

## 2013-05-02 NOTE — Telephone Encounter (Signed)
Mr Bruster left v/m requesting recent lab results and wants to know about obtaining med for diabetes; Mr Oka request cb.

## 2013-05-03 MED ORDER — METFORMIN HCL 1000 MG PO TABS: 1000.0000 mg | ORAL_TABLET | Freq: Two times a day (BID) | ORAL | Status: DC

## 2013-05-03 MED ORDER — PRAVASTATIN SODIUM 20 MG PO TABS: 20.0000 mg | ORAL_TABLET | Freq: Every day | ORAL | Status: DC

## 2013-05-03 MED ORDER — LISINOPRIL 10 MG PO TABS: 10.0000 mg | ORAL_TABLET | Freq: Every day | ORAL | Status: DC

## 2013-05-03 NOTE — Telephone Encounter (Signed)
Pt has been tried Actoplus--there was one more but she is unsure

## 2013-05-06 NOTE — Telephone Encounter (Signed)
Metformin sent to pharmacy and pt is aware 

## 2013-05-07 ENCOUNTER — Telehealth: Payer: Self-pay

## 2013-05-07 NOTE — Telephone Encounter (Signed)
Relevant patient education assigned to patient using Emmi. ° °

## 2013-06-11 ENCOUNTER — Ambulatory Visit (INDEPENDENT_AMBULATORY_CARE_PROVIDER_SITE_OTHER): Payer: BC Managed Care – PPO | Admitting: Internal Medicine

## 2013-06-11 ENCOUNTER — Encounter: Payer: Self-pay | Admitting: Internal Medicine

## 2013-06-11 VITALS — BP 124/76 | HR 73 | Temp 98.3°F | Wt 218.0 lb

## 2013-06-11 DIAGNOSIS — IMO0002 Reserved for concepts with insufficient information to code with codable children: Secondary | ICD-10-CM

## 2013-06-11 DIAGNOSIS — L0201 Cutaneous abscess of face: Secondary | ICD-10-CM

## 2013-06-11 DIAGNOSIS — L03211 Cellulitis of face: Secondary | ICD-10-CM

## 2013-06-11 MED ORDER — SULFAMETHOXAZOLE-TMP DS 800-160 MG PO TABS: 1.0000 | ORAL_TABLET | Freq: Two times a day (BID) | ORAL | Status: DC

## 2013-06-11 NOTE — Patient Instructions (Addendum)
Abscess An abscess is an infected area that contains a collection of pus and debris.It can occur in almost any part of the body. An abscess is also known as a furuncle or boil. CAUSES  An abscess occurs when tissue gets infected. This can occur from blockage of oil or sweat glands, infection of hair follicles, or a minor injury to the skin. As the body tries to fight the infection, pus collects in the area and creates pressure under the skin. This pressure causes pain. People with weakened immune systems have difficulty fighting infections and get certain abscesses more often.  SYMPTOMS Usually an abscess develops on the skin and becomes a painful mass that is red, warm, and tender. If the abscess forms under the skin, you may feel a moveable soft area under the skin. Some abscesses break open (rupture) on their own, but most will continue to get worse without care. The infection can spread deeper into the body and eventually into the bloodstream, causing you to feel ill.  DIAGNOSIS  Your caregiver will take your medical history and perform a physical exam. A sample of fluid may also be taken from the abscess to determine what is causing your infection. TREATMENT  Your caregiver may prescribe antibiotic medicines to fight the infection. However, taking antibiotics alone usually does not cure an abscess. Your caregiver may need to make a small cut (incision) in the abscess to drain the pus. In some cases, gauze is packed into the abscess to reduce pain and to continue draining the area. HOME CARE INSTRUCTIONS   Only take over-the-counter or prescription medicines for pain, discomfort, or fever as directed by your caregiver.  If you were prescribed antibiotics, take them as directed. Finish them even if you start to feel better.  If gauze is used, follow your caregiver's directions for changing the gauze.  To avoid spreading the infection:  Keep your draining abscess covered with a  bandage.  Wash your hands well.  Do not share personal care items, towels, or whirlpools with others.  Avoid skin contact with others.  Keep your skin and clothes clean around the abscess.  Keep all follow-up appointments as directed by your caregiver. SEEK MEDICAL CARE IF:   You have increased pain, swelling, redness, fluid drainage, or bleeding.  You have muscle aches, chills, or a general ill feeling.  You have a fever. MAKE SURE YOU:   Understand these instructions.  Will watch your condition.  Will get help right away if you are not doing well or get worse. Document Released: 10/13/2004 Document Revised: 07/05/2011 Document Reviewed: 03/18/2011 [employer] Patient Information 2014 [employer].  

## 2013-06-11 NOTE — Progress Notes (Signed)
Subjective:    Patient ID: Carrie Singh, female    DOB: 1955-05-11, 58 y.o.   MRN: [id number]  HPI  Pt presents to the clinic today with c/o redness and swelling of her chin. She noticed this 1 week ago. She reports the area did drain some pus yesterday but she feels like it is swelling back up today. She denies fever, chills or body aches. She reports she has been cleaning her husbands abscess and did get some pus on her earlier this week.  Review of Systems      Past Medical History  Diagnosis Date  . Diabetes mellitus without complication   . Hypertension   . Hyperlipidemia   . Cancer     Breast    Current Outpatient Prescriptions  Medication Sig Dispense Refill  . hydrochlorothiazide (MICROZIDE) 12.5 MG capsule Take 12.5 mg by mouth daily.      Marland Kitchen lisinopril (PRINIVIL,ZESTRIL) 10 MG tablet Take 1 tablet (10 mg total) by mouth daily.  90 tablet  1  . metFORMIN (GLUCOPHAGE) 1000 MG tablet Take 1 tablet (1,000 mg total) by mouth 2 (two) times daily with a meal.  60 tablet  0  . NIFEdipine (NIFEDICAL XL PO) Take by mouth.      . pravastatin (PRAVACHOL) 20 MG tablet Take 1 tablet (20 mg total) by mouth daily.  90 tablet  1   No current facility-administered medications for this visit.    No Known Allergies  Family History  Problem Relation Age of Onset  . Stroke Mother   . Heart disease Father   . Asthma Father   . Cancer Father     colon  . Cancer Sister     lung  . Cancer Brother     colon  . Diabetes Neg Hx     History   Social History  . Marital Status: Married    Spouse Name: N/A    Number of Children: N/A  . Years of Education: N/A   Occupational History  . Not on file.   Social History Main Topics  . Smoking status: Never Smoker   . Smokeless tobacco: Not on file  . Alcohol Use: No  . Drug Use: No  . Sexual Activity: Yes   Other Topics Concern  . Not on file   Social History Narrative  . No narrative on file      Constitutional: Denies fever, malaise, fatigue, headache or abrupt weight changes.  Skin: Pt reports redness and swelling of chin. Denies rashes, lesions or ulcercations.     No other specific complaints in a complete review of systems (except as listed in HPI above).  Objective:   Physical Exam  BP 124/76  Pulse 73  Temp(Src) 98.3 F (36.8 C) (Oral)  Wt 218 lb (98.884 kg)  SpO2 99% Wt Readings from Last 3 Encounters:  06/11/13 218 lb (98.884 kg)  04/24/13 217 lb 4 oz (98.544 kg)    General: Appears her stated age, well developed, well nourished in NAD. Skin: Warm, dry and intact. Small nickel size abscess noted of lower chin, with surrounding cellulitis. Cardiovascular: Normal rate and rhythm. S1,S2 noted.  No murmur, rubs or gallops noted. No JVD or BLE edema. No carotid bruits noted. Pulmonary/Chest: Normal effort and positive vesicular breath sounds. No respiratory distress. No wheezes, rales or ronchi noted.    BMET    Component Value Date/Time   NA 136 04/24/2013 1410   K 3.9 04/24/2013 1410   CL  100 04/24/2013 1410   CO2 30 04/24/2013 1410   GLUCOSE 120* 04/24/2013 1410   BUN 16 04/24/2013 1410   CREATININE 1.0 04/24/2013 1410   CALCIUM 9.2 04/24/2013 1410   GFRNONAA 60* 01/04/2008 1001   GFRAA  Value: >60        The eGFR has been calculated using the MDRD equation. This calculation has not been validated in all clinical 01/04/2008 1001    Lipid Panel     Component Value Date/Time   CHOL 239* 04/24/2013 1410   TRIG 195.0* 04/24/2013 1410   HDL 52.50 04/24/2013 1410   CHOLHDL 5 04/24/2013 1410   VLDL 39.0 04/24/2013 1410   LDLCALC 148* 04/24/2013 1410    CBC    Component Value Date/Time   WBC 4.7 04/24/2013 1410   WBC 4.2 08/13/2009 0946   RBC 4.54 04/24/2013 1410   RBC 4.24 08/13/2009 0946   HGB 13.2 04/24/2013 1410   HGB 12.8 08/13/2009 0946   HCT 38.8 04/24/2013 1410   HCT 36.8 08/13/2009 0946   PLT 273.0 04/24/2013 1410   PLT 271 08/13/2009 0946   MCV 85.4 04/24/2013 1410   MCV  87.0 08/13/2009 0946   MCH 30.2 08/13/2009 0946   MCHC 34.0 04/24/2013 1410   MCHC 34.7 08/13/2009 0946   RDW 13.8 04/24/2013 1410   RDW 13.8 08/13/2009 0946   LYMPHSABS 1.5 08/13/2009 0946   LYMPHSABS 1.6 01/04/2008 1001   MONOABS 0.2 08/13/2009 0946   MONOABS 0.3 01/04/2008 1001   EOSABS 0.1 08/13/2009 0946   EOSABS 0.1 01/04/2008 1001   BASOSABS 0.0 08/13/2009 0946   BASOSABS 0.0 01/04/2008 1001    Hgb A1C Lab Results  Component Value Date   HGBA1C 7.8* 04/24/2013         Assessment & Plan:   Cellulitis and abscess of chin:  Unable to be drained today eRx for septra BID x 7 days Encouraged her to use warm compresses in order to allow the area to drain  RTC as needed or if symptoms persist or worsen

## 2013-06-11 NOTE — Progress Notes (Signed)
Pre visit review using our clinic review tool, if applicable. No additional management support is needed unless otherwise documented below in the visit note. 

## 2013-06-27 ENCOUNTER — Encounter: Payer: Self-pay | Admitting: Internal Medicine

## 2013-06-29 ENCOUNTER — Other Ambulatory Visit: Payer: Self-pay | Admitting: Internal Medicine

## 2013-07-07 ENCOUNTER — Other Ambulatory Visit: Payer: Self-pay | Admitting: Internal Medicine

## 2013-08-07 ENCOUNTER — Encounter: Payer: Self-pay | Admitting: Internal Medicine

## 2013-08-12 ENCOUNTER — Encounter: Payer: Self-pay | Admitting: Unknown Physician Specialty

## 2013-08-17 ENCOUNTER — Encounter: Payer: Self-pay | Admitting: Unknown Physician Specialty

## 2013-08-26 ENCOUNTER — Other Ambulatory Visit: Payer: Self-pay

## 2013-08-26 ENCOUNTER — Encounter: Payer: Self-pay | Admitting: Internal Medicine

## 2013-08-26 MED ORDER — NIFEDIPINE ER OSMOTIC RELEASE 60 MG PO TB24: 60.0000 mg | ORAL_TABLET | Freq: Every day | ORAL | Status: DC

## 2013-08-26 MED ORDER — HYDROCHLOROTHIAZIDE 25 MG PO TABS: 25.0000 mg | ORAL_TABLET | Freq: Every day | ORAL | Status: DC

## 2013-09-05 ENCOUNTER — Encounter: Payer: Self-pay | Admitting: Internal Medicine

## 2013-09-05 ENCOUNTER — Ambulatory Visit (INDEPENDENT_AMBULATORY_CARE_PROVIDER_SITE_OTHER): Payer: BC Managed Care – PPO | Admitting: Internal Medicine

## 2013-09-05 ENCOUNTER — Ambulatory Visit (INDEPENDENT_AMBULATORY_CARE_PROVIDER_SITE_OTHER)
Admission: RE | Admit: 2013-09-05 | Discharge: 2013-09-05 | Disposition: A | Discharge status: Home / Self Care | Payer: BC Managed Care – PPO | Source: Ambulatory Visit | Attending: Internal Medicine | Admitting: Internal Medicine

## 2013-09-05 VITALS — BP 104/64 | HR 93 | Temp 98.2°F | Wt 211.0 lb

## 2013-09-05 DIAGNOSIS — R7989 Other specified abnormal findings of blood chemistry: Secondary | ICD-10-CM

## 2013-09-05 DIAGNOSIS — R531 Weakness: Secondary | ICD-10-CM

## 2013-09-05 DIAGNOSIS — K121 Other forms of stomatitis: Secondary | ICD-10-CM

## 2013-09-05 DIAGNOSIS — R52 Pain, unspecified: Secondary | ICD-10-CM

## 2013-09-05 DIAGNOSIS — R5381 Other malaise: Secondary | ICD-10-CM

## 2013-09-05 DIAGNOSIS — R5383 Other fatigue: Secondary | ICD-10-CM

## 2013-09-05 DIAGNOSIS — K1370 Unspecified lesions of oral mucosa: Secondary | ICD-10-CM

## 2013-09-05 LAB — COMPREHENSIVE METABOLIC PANEL
ALT: 22 U/L (ref 0–35)
AST: 22 U/L (ref 0–37)
Albumin: 4.1 g/dL (ref 3.5–5.2)
Alkaline Phosphatase: 57 U/L (ref 39–117)
BUN: 18 mg/dL (ref 6–23)
CO2: 28 mEq/L (ref 19–32)
Calcium: 9.8 mg/dL (ref 8.4–10.5)
Chloride: 102 mEq/L (ref 96–112)
Creatinine, Ser: 1.6 mg/dL — ABNORMAL HIGH (ref 0.4–1.2)
GFR: 41.93 mL/min — ABNORMAL LOW (ref >60.00)
Glucose, Bld: 101 mg/dL — ABNORMAL HIGH (ref 70–99)
Potassium: 3.7 mEq/L (ref 3.5–5.1)
Sodium: 142 mEq/L (ref 135–145)
Total Bilirubin: 1.1 mg/dL (ref 0.2–1.2)
Total Protein: 7.3 g/dL (ref 6.0–8.3)

## 2013-09-05 LAB — LDL CHOLESTEROL, DIRECT: Direct LDL: 169.6 mg/dL

## 2013-09-05 LAB — CBC
HCT: 41 % (ref 36.0–46.0)
Hemoglobin: 13.7 g/dL (ref 12.0–15.0)
MCHC: 33.4 g/dL (ref 30.0–36.0)
MCV: 87.9 fl (ref 78.0–100.0)
Platelets: 280 10*3/uL (ref 150.0–400.0)
RBC: 4.67 Mil/uL (ref 3.87–5.11)
RDW: 13.8 % (ref 11.5–15.5)
WBC: 5.1 10*3/uL (ref 4.0–10.5)

## 2013-09-05 LAB — LIPID PANEL
Cholesterol: 247 mg/dL — ABNORMAL HIGH (ref 0–200)
HDL: 61.4 mg/dL (ref >39.00)
NonHDL: 185.6
Total CHOL/HDL Ratio: 4
Triglycerides: 204 mg/dL — ABNORMAL HIGH (ref 0.0–149.0)
VLDL: 40.8 mg/dL — ABNORMAL HIGH (ref 0.0–40.0)

## 2013-09-05 LAB — HEMOGLOBIN A1C: Hgb A1c MFr Bld: 7.8 % — ABNORMAL HIGH (ref 4.6–6.5)

## 2013-09-05 NOTE — Patient Instructions (Addendum)

## 2013-09-05 NOTE — Progress Notes (Signed)
Subjective:    Patient ID: Carrie Singh, female    DOB: [birth year], 58 y.o.   MRN: [id number]  HPI  Pt presents to the clinic today with c/o fatigue, chills and body aches. She reports this started 2-3 weeks ago. She has had some diarrhea and vomiting. This has resolved. She denies fevers. She denies headache, runny nose, sore throat. She has not been checking her sugars. Her blood pressure is low today. She is on lisinopril, HCTZ and Nifedapine. She has not had sick contacts that she is aware of.   Review of Systems      Past Medical History  Diagnosis Date  . Diabetes mellitus without complication   . Hypertension   . Hyperlipidemia   . Cancer     Breast    Current Outpatient Prescriptions  Medication Sig Dispense Refill  . hydrochlorothiazide (HYDRODIURIL) 25 MG tablet Take 1 tablet (25 mg total) by mouth daily.  30 tablet  2  . lisinopril (PRINIVIL,ZESTRIL) 10 MG tablet TAKE 1 TABLET (10 MG TOTAL) BY MOUTH DAILY.  90 tablet  0  . metFORMIN (GLUCOPHAGE) 1000 MG tablet Take 1 tablet (1,000 mg total) by mouth 2 (two) times daily with a meal.  60 tablet  3  . NIFEdipine (PROCARDIA XL/ADALAT-CC) 60 MG 24 hr tablet Take 1 tablet (60 mg total) by mouth daily.  30 tablet  2  . pravastatin (PRAVACHOL) 20 MG tablet TAKE 1 TABLET (20 MG TOTAL) BY MOUTH DAILY.  90 tablet  0   No current facility-administered medications for this visit.    No Known Allergies  Family History  Problem Relation Age of Onset  . Stroke Mother   . Heart disease Father   . Asthma Father   . Cancer Father     colon  . Cancer Sister     lung  . Cancer Brother     colon  . Diabetes Neg Hx     History   Social History  . Marital Status: Married    Spouse Name: N/A    Number of Children: N/A  . Years of Education: N/A   Occupational History  . Not on file.   Social History Main Topics  . Smoking status: Never Smoker   . Smokeless tobacco: Not on file  . Alcohol Use: No  . Drug Use: No  .  Sexual Activity: Yes   Other Topics Concern  . Not on file   Social History Narrative  . No narrative on file     Constitutional: Pt reports fatigue and chills. Denies fever, malaise,headache or abrupt weight changes.  HEENT: Denies eye pain, eye redness, ear pain, ringing in the ears, wax buildup, runny nose, nasal congestion, bloody nose, or sore throat. Respiratory: Denies difficulty breathing, shortness of breath, cough or sputum production.   Cardiovascular: Denies chest pain, chest tightness, palpitations or swelling in the hands or feet.  Gastrointestinal: Denies abdominal pain, bloating, constipation, diarrhea or blood in the stool.  GU: Denies urgency, frequency, pain with urination, burning sensation, blood in urine, odor or discharge. Musculoskeletal: Pt reports body aches. Denies decrease in range of motion, difficulty with gait,  or joint swelling.    No other specific complaints in a complete review of systems (except as listed in HPI above).  Objective:   Physical Exam  BP 104/64  Pulse 93  Temp(Src) 98.2 F (36.8 C) (Oral)  Wt 211 lb (95.709 kg)  SpO2 98% Wt Readings from Last 3 Encounters:  09/05/13  211 lb (95.709 kg)  06/11/13 218 lb (98.884 kg)  04/24/13 217 lb 4 oz (98.544 kg)    General: Appears her stated age, fatigued but in NAD. Skin: Warm, dry and intact. No rashes, lesions or ulcerations noted. HEENT: Head: normal shape and size;  Ears: Tm's gray and intact, normal light reflex; Throat/Mouth: Teeth present, mucosa pink and moist, no exudate, lesions noted. Ulcers noted on soft palate. Cardiovascular: Normal rate and rhythm. S1,S2 noted.  No murmur, rubs or gallops noted. No JVD or BLE edema. No carotid bruits noted. Pulmonary/Chest: Normal effort and diminished vesicular breath sounds. No respiratory distress. No wheezes, rales or ronchi noted.   BMET    Component Value Date/Time   NA 136 04/24/2013 1410   K 3.9 04/24/2013 1410   CL 100 04/24/2013  1410   CO2 30 04/24/2013 1410   GLUCOSE 120* 04/24/2013 1410   BUN 16 04/24/2013 1410   CREATININE 1.0 04/24/2013 1410   CALCIUM 9.2 04/24/2013 1410   GFRNONAA 60* 01/04/2008 1001   GFRAA  Value: >60        The eGFR has been calculated using the MDRD equation. This calculation has not been validated in all clinical 01/04/2008 1001    Lipid Panel     Component Value Date/Time   CHOL 239* 04/24/2013 1410   TRIG 195.0* 04/24/2013 1410   HDL 52.50 04/24/2013 1410   CHOLHDL 5 04/24/2013 1410   VLDL 39.0 04/24/2013 1410   LDLCALC 148* 04/24/2013 1410    CBC    Component Value Date/Time   WBC 4.7 04/24/2013 1410   WBC 4.2 08/13/2009 0946   RBC 4.54 04/24/2013 1410   RBC 4.24 08/13/2009 0946   HGB 13.2 04/24/2013 1410   HGB 12.8 08/13/2009 0946   HCT 38.8 04/24/2013 1410   HCT 36.8 08/13/2009 0946   PLT 273.0 04/24/2013 1410   PLT 271 08/13/2009 0946   MCV 85.4 04/24/2013 1410   MCV 87.0 08/13/2009 0946   MCH 30.2 08/13/2009 0946   MCHC 34.0 04/24/2013 1410   MCHC 34.7 08/13/2009 0946   RDW 13.8 04/24/2013 1410   RDW 13.8 08/13/2009 0946   LYMPHSABS 1.5 08/13/2009 0946   LYMPHSABS 1.6 01/04/2008 1001   MONOABS 0.2 08/13/2009 0946   MONOABS 0.3 01/04/2008 1001   EOSABS 0.1 08/13/2009 0946   EOSABS 0.1 01/04/2008 1001   BASOSABS 0.0 08/13/2009 0946   BASOSABS 0.0 01/04/2008 1001    Hgb A1C Lab Results  Component Value Date   HGBA1C 7.8* 04/24/2013         Assessment & Plan:  Fatigue,    Fatigue, weakness, body aches, chills, oral ulcers:  Will check labs today cbc, cmet, lipid, A1C, RPR, HIV and chest xray Encouraged her to drink plenty of fluids to avoid dehydration BP is low today, will stop procardia  Will call you with results, to ER if worse

## 2013-09-05 NOTE — Progress Notes (Signed)
Pre visit review using our clinic review tool, if applicable. No additional management support is needed unless otherwise documented below in the visit note. 

## 2013-09-06 LAB — RPR

## 2013-09-06 LAB — HIV ANTIBODY (ROUTINE TESTING W REFLEX): HIV 1&2 Ab, 4th Generation: NONREACTIVE

## 2013-09-17 ENCOUNTER — Encounter: Payer: Self-pay | Admitting: Unknown Physician Specialty

## 2013-10-07 ENCOUNTER — Ambulatory Visit (INDEPENDENT_AMBULATORY_CARE_PROVIDER_SITE_OTHER): Payer: BC Managed Care – PPO | Admitting: Internal Medicine

## 2013-10-07 ENCOUNTER — Encounter: Payer: Self-pay | Admitting: Internal Medicine

## 2013-10-07 VITALS — BP 156/104 | HR 72 | Temp 98.2°F | Wt 213.0 lb

## 2013-10-07 DIAGNOSIS — N289 Disorder of kidney and ureter, unspecified: Secondary | ICD-10-CM

## 2013-10-07 DIAGNOSIS — I10 Essential (primary) hypertension: Secondary | ICD-10-CM

## 2013-10-07 LAB — BASIC METABOLIC PANEL
BUN: 13 mg/dL (ref 6–23)
CO2: 28 mEq/L (ref 19–32)
Calcium: 9.6 mg/dL (ref 8.4–10.5)
Chloride: 107 mEq/L (ref 96–112)
Creatinine, Ser: 1 mg/dL (ref 0.4–1.2)
GFR: 72.63 mL/min (ref >60.00)
Glucose, Bld: 99 mg/dL (ref 70–99)
Potassium: 4.2 mEq/L (ref 3.5–5.1)
Sodium: 142 mEq/L (ref 135–145)

## 2013-10-07 MED ORDER — LISINOPRIL 20 MG PO TABS: 20.0000 mg | ORAL_TABLET | Freq: Every day | ORAL | Status: DC

## 2013-10-07 MED ORDER — PRAVASTATIN SODIUM 40 MG PO TABS: 40.0000 mg | ORAL_TABLET | Freq: Every day | ORAL | Status: DC

## 2013-10-07 NOTE — Patient Instructions (Addendum)

## 2013-10-07 NOTE — Progress Notes (Signed)
Subjective:    Patient ID: Carrie Singh, female    DOB: Jan 29, 1955, 58 y.o.   MRN: [id number]  HPI  Pt presents to the clinic today to follow up low blood pressure and kidney dysfunction. At her last visit, her BP was 104/64. At that time, she was on procardia, hctz, and lisinopril. I advised her to stop her procardia and we would recheck her BP in 1 month. Subsequently, upon doing labs at the same visit, her creatinine had take a signicant bump up to 1.6 from 1.0 5 months earlier. She was advised to stop her hctz at that time and we would reasses her kidney function when she came back to check her BP. She was to continue on the metformin at that time but advised if kidney function had not improved, that she would have to stop the metformin as well.  Review of Systems      Past Medical History  Diagnosis Date  . Diabetes mellitus without complication   . Hypertension   . Hyperlipidemia   . Cancer     Breast    Current Outpatient Prescriptions  Medication Sig Dispense Refill  . hydrochlorothiazide (HYDRODIURIL) 25 MG tablet Take 1 tablet (25 mg total) by mouth daily.  30 tablet  2  . lisinopril (PRINIVIL,ZESTRIL) 10 MG tablet TAKE 1 TABLET (10 MG TOTAL) BY MOUTH DAILY.  90 tablet  0  . metFORMIN (GLUCOPHAGE) 1000 MG tablet Take 1 tablet (1,000 mg total) by mouth 2 (two) times daily with a meal.  60 tablet  3  . NIFEdipine (PROCARDIA XL/ADALAT-CC) 60 MG 24 hr tablet Take 1 tablet (60 mg total) by mouth daily.  30 tablet  2  . pravastatin (PRAVACHOL) 20 MG tablet TAKE 1 TABLET (20 MG TOTAL) BY MOUTH DAILY.  90 tablet  0   No current facility-administered medications for this visit.    No Known Allergies  Family History  Problem Relation Age of Onset  . Stroke Mother   . Heart disease Father   . Asthma Father   . Cancer Father     colon  . Cancer Sister     lung  . Cancer Brother     colon  . Diabetes Neg Hx     History   Social History  . Marital Status: Married   Spouse Name: N/A    Number of Children: N/A  . Years of Education: N/A   Occupational History  . Not on file.   Social History Main Topics  . Smoking status: Never Smoker   . Smokeless tobacco: Not on file  . Alcohol Use: No  . Drug Use: No  . Sexual Activity: Yes   Other Topics Concern  . Not on file   Social History Narrative  . No narrative on file     Constitutional: Denies fever, malaise, fatigue, headache or abrupt weight changes.  Respiratory: Denies difficulty breathing, shortness of breath, cough or sputum production.   Cardiovascular: Denies chest pain, chest tightness, palpitations or swelling in the hands or feet.    No other specific complaints in a complete review of systems (except as listed in HPI above).  Objective:   Physical Exam   BP 156/104  Pulse 72  Temp(Src) 98.2 F (36.8 C) (Oral)  Wt 213 lb (96.616 kg)  SpO2 99% Wt Readings from Last 3 Encounters:  10/07/13 213 lb (96.616 kg)  09/05/13 211 lb (95.709 kg)  06/11/13 218 lb (98.884 kg)    General: Appears  her stated age, well developed, well nourished in NAD. Cardiovascular: Normal rate and rhythm. S1,S2 noted.  No murmur, rubs or gallops noted. No JVD or BLE edema. No carotid bruits noted. Pulmonary/Chest: Normal effort and positive vesicular breath sounds. No respiratory distress. No wheezes, rales or ronchi noted.      BMET    Component Value Date/Time   NA 142 09/05/2013 1337   K 3.7 09/05/2013 1337   CL 102 09/05/2013 1337   CO2 28 09/05/2013 1337   GLUCOSE 101* 09/05/2013 1337   BUN 18 09/05/2013 1337   CREATININE 1.6* 09/05/2013 1337   CALCIUM 9.8 09/05/2013 1337   GFRNONAA 60* 01/04/2008 1001   GFRAA  Value: >60        The eGFR has been calculated using the MDRD equation. This calculation has not been validated in all clinical 01/04/2008 1001    Lipid Panel     Component Value Date/Time   CHOL 247* 09/05/2013 1337   TRIG 204.0* 09/05/2013 1337   HDL 61.40 09/05/2013 1337    CHOLHDL 4 09/05/2013 1337   VLDL 40.8* 09/05/2013 1337   LDLCALC 148* 04/24/2013 1410    CBC    Component Value Date/Time   WBC 5.1 09/05/2013 1337   WBC 4.2 08/13/2009 0946   RBC 4.67 09/05/2013 1337   RBC 4.24 08/13/2009 0946   HGB 13.7 09/05/2013 1337   HGB 12.8 08/13/2009 0946   HCT 41.0 09/05/2013 1337   HCT 36.8 08/13/2009 0946   PLT 280.0 09/05/2013 1337   PLT 271 08/13/2009 0946   MCV 87.9 09/05/2013 1337   MCV 87.0 08/13/2009 0946   MCH 30.2 08/13/2009 0946   MCHC 33.4 09/05/2013 1337   MCHC 34.7 08/13/2009 0946   RDW 13.8 09/05/2013 1337   RDW 13.8 08/13/2009 0946   LYMPHSABS 1.5 08/13/2009 0946   LYMPHSABS 1.6 01/04/2008 1001   MONOABS 0.2 08/13/2009 0946   MONOABS 0.3 01/04/2008 1001   EOSABS 0.1 08/13/2009 0946   EOSABS 0.1 01/04/2008 1001   BASOSABS 0.0 08/13/2009 0946   BASOSABS 0.0 01/04/2008 1001    Hgb A1C Lab Results  Component Value Date   HGBA1C 7.8* 09/05/2013        Assessment & Plan:   Kidney dysfunction:  Will repeat BMET today If creatinine is increased, will stop metformin

## 2013-10-07 NOTE — Progress Notes (Signed)
Pre visit review using our clinic review tool, if applicable. No additional management support is needed unless otherwise documented below in the visit note. 

## 2013-10-07 NOTE — Assessment & Plan Note (Signed)
Elevated today d/t stopping 2 medications She does not want to restart the nifedapine due to cost Will increase lisinopril to 20 mg daily Consume a low sodium diet  RTC in 1 month to recheck BP

## 2013-10-11 ENCOUNTER — Encounter: Payer: Self-pay | Admitting: Internal Medicine

## 2013-10-12 ENCOUNTER — Emergency Department: Payer: Self-pay | Admitting: Emergency Medicine

## 2013-10-12 LAB — COMPREHENSIVE METABOLIC PANEL
Albumin: 3.9 g/dL (ref 3.4–5.0)
Alkaline Phosphatase: 67 U/L
Anion Gap: 5 — ABNORMAL LOW (ref 7–16)
BUN: 16 mg/dL (ref 7–18)
Bilirubin,Total: 0.9 mg/dL (ref 0.2–1.0)
Calcium, Total: 9.4 mg/dL (ref 8.5–10.1)
Chloride: 109 mmol/L — ABNORMAL HIGH (ref 98–107)
Co2: 29 mmol/L (ref 21–32)
Creatinine: 1.07 mg/dL (ref 0.60–1.30)
EGFR (African American): >60
EGFR (Non-African Amer.): 56 — ABNORMAL LOW
Glucose: 92 mg/dL (ref 65–99)
Osmolality: 286 (ref 275–301)
Potassium: 3.9 mmol/L (ref 3.5–5.1)
SGOT(AST): 22 U/L (ref 15–37)
SGPT (ALT): 22 U/L
Sodium: 143 mmol/L (ref 136–145)
Total Protein: 7.6 g/dL (ref 6.4–8.2)

## 2013-10-12 LAB — CBC
HCT: 40.5 % (ref 35.0–47.0)
HGB: 13.5 g/dL (ref 12.0–16.0)
MCH: 29.1 pg (ref 26.0–34.0)
MCHC: 33.3 g/dL (ref 32.0–36.0)
MCV: 87 fL (ref 80–100)
Platelet: 278 10*3/uL (ref 150–440)
RBC: 4.64 10*6/uL (ref 3.80–5.20)
RDW: 13.6 % (ref 11.5–14.5)
WBC: 4.9 10*3/uL (ref 3.6–11.0)

## 2013-10-12 LAB — SEDIMENTATION RATE: Erythrocyte Sed Rate: 15 mm/hr (ref 0–30)

## 2013-11-11 ENCOUNTER — Encounter: Payer: Self-pay | Admitting: Internal Medicine

## 2013-11-11 ENCOUNTER — Ambulatory Visit (INDEPENDENT_AMBULATORY_CARE_PROVIDER_SITE_OTHER): Payer: BC Managed Care – PPO | Admitting: Internal Medicine

## 2013-11-11 VITALS — BP 142/102 | HR 86 | Temp 98.8°F | Wt 206.0 lb

## 2013-11-11 DIAGNOSIS — F32A Depression, unspecified: Secondary | ICD-10-CM

## 2013-11-11 DIAGNOSIS — Z853 Personal history of malignant neoplasm of breast: Secondary | ICD-10-CM

## 2013-11-11 DIAGNOSIS — E1129 Type 2 diabetes mellitus with other diabetic kidney complication: Secondary | ICD-10-CM

## 2013-11-11 DIAGNOSIS — E669 Obesity, unspecified: Secondary | ICD-10-CM

## 2013-11-11 DIAGNOSIS — E785 Hyperlipidemia, unspecified: Secondary | ICD-10-CM

## 2013-11-11 DIAGNOSIS — F329 Major depressive disorder, single episode, unspecified: Secondary | ICD-10-CM

## 2013-11-11 DIAGNOSIS — I10 Essential (primary) hypertension: Secondary | ICD-10-CM

## 2013-11-11 LAB — COMPREHENSIVE METABOLIC PANEL
ALT: 16 U/L (ref 0–35)
AST: 20 U/L (ref 0–37)
Albumin: 3.8 g/dL (ref 3.5–5.2)
Alkaline Phosphatase: 58 U/L (ref 39–117)
BUN: 9 mg/dL (ref 6–23)
CO2: 31 mEq/L (ref 19–32)
Calcium: 9.5 mg/dL (ref 8.4–10.5)
Chloride: 101 mEq/L (ref 96–112)
Creatinine, Ser: 1.3 mg/dL — ABNORMAL HIGH (ref 0.4–1.2)
GFR: 56.12 mL/min — ABNORMAL LOW (ref >60.00)
Glucose, Bld: 105 mg/dL — ABNORMAL HIGH (ref 70–99)
Potassium: 4.4 mEq/L (ref 3.5–5.1)
Sodium: 140 mEq/L (ref 135–145)
Total Bilirubin: 1.1 mg/dL (ref 0.2–1.2)
Total Protein: 7.6 g/dL (ref 6.0–8.3)

## 2013-11-11 LAB — LIPID PANEL
Cholesterol: 176 mg/dL (ref 0–200)
HDL: 54.7 mg/dL (ref >39.00)
LDL Cholesterol: 89 mg/dL (ref 0–99)
NonHDL: 121.3
Total CHOL/HDL Ratio: 3
Triglycerides: 161 mg/dL — ABNORMAL HIGH (ref 0.0–149.0)
VLDL: 32.2 mg/dL (ref 0.0–40.0)

## 2013-11-11 LAB — HEMOGLOBIN A1C: Hgb A1c MFr Bld: 6.2 % (ref 4.6–6.5)

## 2013-11-11 MED ORDER — AMLODIPINE BESYLATE 5 MG PO TABS: 5.0000 mg | ORAL_TABLET | Freq: Every day | ORAL | Status: DC

## 2013-11-11 NOTE — Progress Notes (Signed)
Pre visit review using our clinic review tool, if applicable. No additional management support is needed unless otherwise documented below in the visit note. 

## 2013-11-11 NOTE — Patient Instructions (Addendum)
Fat and Cholesterol Control Diet Fat and cholesterol levels in your blood and organs are influenced by your diet. High levels of fat and cholesterol may lead to diseases of the heart, small and large blood vessels, gallbladder, liver, and pancreas. CONTROLLING FAT AND CHOLESTEROL WITH DIET Although exercise and lifestyle factors are important, your diet is key. That is because certain foods are known to raise cholesterol and others to lower it. The goal is to balance foods for their effect on cholesterol and more importantly, to replace saturated and trans fat with other types of fat, such as monounsaturated fat, polyunsaturated fat, and omega-3 fatty acids. On average, a person should consume no more than 15 to 17 g of saturated fat daily. Saturated and trans fats are considered "bad" fats, and they will raise LDL cholesterol. Saturated fats are primarily found in animal products such as meats, butter, and cream. However, that does not mean you need to give up all your favorite foods. Today, there are good tasting, low-fat, low-cholesterol substitutes for most of the things you like to eat. Choose low-fat or nonfat alternatives. Choose round or loin cuts of red meat. These types of cuts are lowest in fat and cholesterol. Chicken (without the skin), fish, veal, and ground turkey breast are great choices. Eliminate fatty meats, such as hot dogs and salami. Even shellfish have little or no saturated fat. Have a 3 oz (85 g) portion when you eat lean meat, poultry, or fish. Trans fats are also called "partially hydrogenated oils." They are oils that have been scientifically manipulated so that they are solid at room temperature resulting in a longer shelf life and improved taste and texture of foods in which they are added. Trans fats are found in stick margarine, some tub margarines, cookies, crackers, and baked goods.  When baking and cooking, oils are a great substitute for butter. The monounsaturated oils are  especially beneficial since it is believed they lower LDL and raise HDL. The oils you should avoid entirely are saturated tropical oils, such as coconut and palm.  Remember to eat a lot from food groups that are naturally free of saturated and trans fat, including fish, fruit, vegetables, beans, grains (barley, rice, couscous, bulgur wheat), and pasta (without cream sauces).  IDENTIFYING FOODS THAT LOWER FAT AND CHOLESTEROL  Soluble fiber may lower your cholesterol. This type of fiber is found in fruits such as apples, vegetables such as broccoli, potatoes, and carrots, legumes such as beans, peas, and lentils, and grains such as barley. Foods fortified with plant sterols (phytosterol) may also lower cholesterol. You should eat at least 2 g per day of these foods for a cholesterol lowering effect.  Read package labels to identify low-saturated fats, trans fat free, and low-fat foods at the supermarket. Select cheeses that have only 2 to 3 g saturated fat per ounce. Use a heart-healthy tub margarine that is free of trans fats or partially hydrogenated oil. When buying baked goods (cookies, crackers), avoid partially hydrogenated oils. Breads and muffins should be made from whole grains (whole-wheat or whole oat flour, instead of "flour" or "enriched flour"). Buy non-creamy canned soups with reduced salt and no added fats.  FOOD PREPARATION TECHNIQUES  Never deep-fry. If you must fry, either stir-fry, which uses very little fat, or use non-stick cooking sprays. When possible, broil, bake, or roast meats, and steam vegetables. Instead of putting butter or margarine on vegetables, use lemon and herbs, applesauce, and cinnamon (for squash and sweet potatoes). Use nonfat   yogurt, salsa, and low-fat dressings for salads.  LOW-SATURATED FAT / LOW-FAT FOOD SUBSTITUTES Meats / Saturated Fat (g)  Avoid: Steak, marbled (3 oz/85 g) / 11 g  Choose: Steak, lean (3 oz/85 g) / 4 g  Avoid: Hamburger (3 oz/85 g) / 7  g  Choose: Hamburger, lean (3 oz/85 g) / 5 g  Avoid: Ham (3 oz/85 g) / 6 g  Choose: Ham, lean cut (3 oz/85 g) / 2.4 g  Avoid: Chicken, with skin, dark meat (3 oz/85 g) / 4 g  Choose: Chicken, skin removed, dark meat (3 oz/85 g) / 2 g  Avoid: Chicken, with skin, light meat (3 oz/85 g) / 2.5 g  Choose: Chicken, skin removed, light meat (3 oz/85 g) / 1 g Dairy / Saturated Fat (g)  Avoid: Whole milk (1 cup) / 5 g  Choose: Low-fat milk, 2% (1 cup) / 3 g  Choose: Low-fat milk, 1% (1 cup) / 1.5 g  Choose: Skim milk (1 cup) / 0.3 g  Avoid: Hard cheese (1 oz/28 g) / 6 g  Choose: Skim milk cheese (1 oz/28 g) / 2 to 3 g  Avoid: Cottage cheese, 4% fat (1 cup) / 6.5 g  Choose: Low-fat cottage cheese, 1% fat (1 cup) / 1.5 g  Avoid: Ice cream (1 cup) / 9 g  Choose: Sherbet (1 cup) / 2.5 g  Choose: Nonfat frozen yogurt (1 cup) / 0.3 g  Choose: Frozen fruit bar / trace  Avoid: Whipped cream (1 tbs) / 3.5 g  Choose: Nondairy whipped topping (1 tbs) / 1 g Condiments / Saturated Fat (g)  Avoid: Mayonnaise (1 tbs) / 2 g  Choose: Low-fat mayonnaise (1 tbs) / 1 g  Avoid: Butter (1 tbs) / 7 g  Choose: Extra light margarine (1 tbs) / 1 g  Avoid: Coconut oil (1 tbs) / 11.8 g  Choose: Olive oil (1 tbs) / 1.8 g  Choose: Corn oil (1 tbs) / 1.7 g  Choose: Safflower oil (1 tbs) / 1.2 g  Choose: Sunflower oil (1 tbs) / 1.4 g  Choose: Soybean oil (1 tbs) / 2.4 g  Choose: Canola oil (1 tbs) / 1 g Document Released: 01/03/2005 Document Revised: 04/30/2012 Document Reviewed: 04/03/2013 [employer] Patient Information 2015 [employer]. This information is not intended to replace advice given to you by your health care provider. Make sure you discuss any questions you have with your health care provider.  

## 2013-11-12 ENCOUNTER — Encounter: Payer: Self-pay | Admitting: Internal Medicine

## 2013-11-12 NOTE — Assessment & Plan Note (Signed)
Recent 7 lb weight loss- congratulated her on this Advised her to continue to work on diet and exercise Discussed how obesity correlates with HTN and DM2

## 2013-11-12 NOTE — Progress Notes (Signed)
Subjective:    Patient ID: Carrie Singh, female    DOB: [birth year], 58 y.o.   MRN: [id number]  HPI  Pt presents to the clinic today for 6 month follow up of chronic conditions.   DM 2 with kidney complication: She reports that her sugars have been elevated more than normal. She has been taking her Metformin as prescribed. She does not adhere to a diabetic diet nor does she exercise. She has lost 7 lbs over the last month. She declines flu and pneumonia vaccine. She did have her eye exam 09/2013.  HTN: . At her last visit, HCTZ was stopped secondary to elevation in creatinine. Her lisinopril was increased and she asked to stop her nifedapine (she could not afford it). BP today 146/102. She does not monitor it at home.  HLD: Denies myalgias on pravastatin.  History of breast cancer: s/p lumpectomy. Mammogram 12/2012 reviewed- normal.  Review of Systems      Past Medical History  Diagnosis Date  . Diabetes mellitus without complication   . Hypertension   . Hyperlipidemia   . Cancer     Breast    Current Outpatient Prescriptions  Medication Sig Dispense Refill  . lisinopril (PRINIVIL,ZESTRIL) 20 MG tablet Take 1 tablet (20 mg total) by mouth daily.  30 tablet  2  . metFORMIN (GLUCOPHAGE) 1000 MG tablet Take 1 tablet (1,000 mg total) by mouth 2 (two) times daily with a meal.  60 tablet  3  . pravastatin (PRAVACHOL) 40 MG tablet Take 1 tablet (40 mg total) by mouth daily.  30 tablet  2  . amLODipine (NORVASC) 5 MG tablet Take 1 tablet (5 mg total) by mouth daily.  30 tablet  2   No current facility-administered medications for this visit.    No Known Allergies  Family History  Problem Relation Age of Onset  . Stroke Mother   . Heart disease Father   . Asthma Father   . Cancer Father     colon  . Cancer Sister     lung  . Cancer Brother     colon  . Diabetes Neg Hx     History   Social History  . Marital Status: Married    Spouse Name: N/A    Number of  Children: N/A  . Years of Education: N/A   Occupational History  . Not on file.   Social History Main Topics  . Smoking status: Never Smoker   . Smokeless tobacco: Not on file  . Alcohol Use: No  . Drug Use: No  . Sexual Activity: Yes   Other Topics Concern  . Not on file   Social History Narrative  . No narrative on file     Constitutional: Denies fever, malaise, fatigue, headache or abrupt weight changes.  Respiratory: Denies difficulty breathing, shortness of breath, cough or sputum production.   Cardiovascular: Denies chest pain, chest tightness, palpitations or swelling in the hands or feet.  Gastrointestinal: Denies abdominal pain, bloating, constipation, diarrhea or blood in the stool.  Skin: Denies redness, rashes, lesions or ulcercations.  Neurological: Denies dizziness, difficulty with memory, difficulty with speech or problems with balance and coordination.   No other specific complaints in a complete review of systems (except as listed in HPI above).  Objective:   Physical Exam   BP 142/102  Pulse 86  Temp(Src) 98.8 F (37.1 C) (Oral)  Wt 206 lb (93.441 kg)  SpO2 98% Wt Readings from Last 3 Encounters:  11/11/13  206 lb (93.441 kg)  10/07/13 213 lb (96.616 kg)  09/05/13 211 lb (95.709 kg)    General: Appears her stated age, obese but well developed, well nourished in NAD. Skin: Warm, dry and intact. No rashes, lesions or ulcerations noted. Cardiovascular: Normal rate and rhythm. S1,S2 noted.  No murmur, rubs or gallops noted. No JVD or BLE edema. No carotid bruits noted. Pulmonary/Chest: Normal effort and positive vesicular breath sounds. No respiratory distress. No wheezes, rales or ronchi noted.  Abdomen: Soft and nontender. Normal bowel sounds, no bruits noted. No distention or masses noted. Liver, spleen and kidneys non palpable. Neurological: Alert and oriented. Cranial nerves II-XII grossly intact.    BMET    Component Value Date/Time   NA 140  11/11/2013 1434   K 4.4 11/11/2013 1434   CL 101 11/11/2013 1434   CO2 31 11/11/2013 1434   GLUCOSE 105* 11/11/2013 1434   BUN 9 11/11/2013 1434   CREATININE 1.3* 11/11/2013 1434   CALCIUM 9.5 11/11/2013 1434   GFRNONAA 60* 01/04/2008 1001   GFRAA  Value: >60        The eGFR has been calculated using the MDRD equation. This calculation has not been validated in all clinical 01/04/2008 1001    Lipid Panel     Component Value Date/Time   CHOL 176 11/11/2013 1434   TRIG 161.0* 11/11/2013 1434   HDL 54.70 11/11/2013 1434   CHOLHDL 3 11/11/2013 1434   VLDL 32.2 11/11/2013 1434   LDLCALC 89 11/11/2013 1434    CBC    Component Value Date/Time   WBC 5.1 09/05/2013 1337   WBC 4.2 08/13/2009 0946   RBC 4.67 09/05/2013 1337   RBC 4.24 08/13/2009 0946   HGB 13.7 09/05/2013 1337   HGB 12.8 08/13/2009 0946   HCT 41.0 09/05/2013 1337   HCT 36.8 08/13/2009 0946   PLT 280.0 09/05/2013 1337   PLT 271 08/13/2009 0946   MCV 87.9 09/05/2013 1337   MCV 87.0 08/13/2009 0946   MCH 30.2 08/13/2009 0946   MCHC 33.4 09/05/2013 1337   MCHC 34.7 08/13/2009 0946   RDW 13.8 09/05/2013 1337   RDW 13.8 08/13/2009 0946   LYMPHSABS 1.5 08/13/2009 0946   LYMPHSABS 1.6 01/04/2008 1001   MONOABS 0.2 08/13/2009 0946   MONOABS 0.3 01/04/2008 1001   EOSABS 0.1 08/13/2009 0946   EOSABS 0.1 01/04/2008 1001   BASOSABS 0.0 08/13/2009 0946   BASOSABS 0.0 01/04/2008 1001    Hgb A1C Lab Results  Component Value Date   HGBA1C 6.2 11/11/2013         Assessment & Plan:

## 2013-11-12 NOTE — Assessment & Plan Note (Signed)
Mammogram reviewed Will get yearly mammograms

## 2013-11-12 NOTE — Assessment & Plan Note (Signed)
Last A1C 6.2% Will repeat A1C today If lower, can cut back on the Metformin Congratulated her on weight loss Advised her to continue to work on diet and exercise

## 2013-11-12 NOTE — Assessment & Plan Note (Signed)
No issues on pravastatin Will repeat lipid profile and CMET today Encouraged low fat diet

## 2013-11-12 NOTE — Assessment & Plan Note (Signed)
In remission No need for medication at this time

## 2013-11-12 NOTE — Assessment & Plan Note (Signed)
Elevated today Continue Lisinopril 20 mg daily Will add Norvasc 5 mg daily (replacing Nifedapine) Will check CBC and CMET today  RTC in 1 month to recheck BP

## 2013-11-28 ENCOUNTER — Other Ambulatory Visit: Payer: Self-pay

## 2013-11-28 ENCOUNTER — Other Ambulatory Visit: Payer: Self-pay | Admitting: Internal Medicine

## 2013-11-28 DIAGNOSIS — Z1231 Encounter for screening mammogram for malignant neoplasm of breast: Secondary | ICD-10-CM

## 2013-12-23 ENCOUNTER — Ambulatory Visit
Admission: RE | Admit: 2013-12-23 | Discharge: 2013-12-23 | Disposition: A | Discharge status: Home / Self Care | Payer: BC Managed Care – PPO | Source: Ambulatory Visit | Attending: Internal Medicine | Admitting: Internal Medicine

## 2013-12-23 DIAGNOSIS — Z1231 Encounter for screening mammogram for malignant neoplasm of breast: Secondary | ICD-10-CM

## 2014-01-30 ENCOUNTER — Other Ambulatory Visit: Payer: Self-pay | Admitting: Internal Medicine

## 2014-02-06 ENCOUNTER — Other Ambulatory Visit: Payer: Self-pay | Admitting: Internal Medicine

## 2014-02-09 ENCOUNTER — Other Ambulatory Visit: Payer: Self-pay | Admitting: Internal Medicine

## 2014-02-19 ENCOUNTER — Encounter: Payer: Self-pay | Admitting: Internal Medicine

## 2014-02-28 ENCOUNTER — Other Ambulatory Visit: Payer: Self-pay | Admitting: Internal Medicine

## 2014-04-01 ENCOUNTER — Encounter: Payer: Self-pay | Admitting: Internal Medicine

## 2014-04-01 MED ORDER — LISINOPRIL 20 MG PO TABS: 20.0000 mg | ORAL_TABLET | Freq: Every day | ORAL | Status: DC

## 2014-04-01 MED ORDER — PRAVASTATIN SODIUM 40 MG PO TABS: 40.0000 mg | ORAL_TABLET | Freq: Every day | ORAL | Status: DC

## 2014-04-01 MED ORDER — METFORMIN HCL 1000 MG PO TABS: 1000.0000 mg | ORAL_TABLET | Freq: Two times a day (BID) | ORAL | Status: DC

## 2014-04-01 NOTE — Telephone Encounter (Signed)
Will send in 1 more 30 day supply, pt will need to make a 6 mth f/u appt for anymore refills

## 2014-04-02 MED ORDER — METFORMIN HCL 1000 MG PO TABS: 1000.0000 mg | ORAL_TABLET | Freq: Two times a day (BID) | ORAL | Status: DC

## 2014-04-02 NOTE — Telephone Encounter (Signed)
Pt has made a 6 mth f/u appt

## 2014-04-03 ENCOUNTER — Ambulatory Visit (INDEPENDENT_AMBULATORY_CARE_PROVIDER_SITE_OTHER): Payer: BLUE CROSS/BLUE SHIELD

## 2014-04-03 ENCOUNTER — Ambulatory Visit (INDEPENDENT_AMBULATORY_CARE_PROVIDER_SITE_OTHER): Payer: BLUE CROSS/BLUE SHIELD | Admitting: Podiatry

## 2014-04-03 ENCOUNTER — Ambulatory Visit (INDEPENDENT_AMBULATORY_CARE_PROVIDER_SITE_OTHER): Payer: BLUE CROSS/BLUE SHIELD | Admitting: Internal Medicine

## 2014-04-03 ENCOUNTER — Encounter: Payer: Self-pay | Admitting: Podiatry

## 2014-04-03 ENCOUNTER — Encounter: Payer: Self-pay | Admitting: Internal Medicine

## 2014-04-03 VITALS — BP 132/84 | HR 76 | Temp 98.1°F | Wt 204.0 lb

## 2014-04-03 VITALS — BP 132/61 | HR 89 | Resp 16

## 2014-04-03 DIAGNOSIS — M205X1 Other deformities of toe(s) (acquired), right foot: Secondary | ICD-10-CM

## 2014-04-03 DIAGNOSIS — E119 Type 2 diabetes mellitus without complications: Secondary | ICD-10-CM

## 2014-04-03 DIAGNOSIS — F329 Major depressive disorder, single episode, unspecified: Secondary | ICD-10-CM

## 2014-04-03 DIAGNOSIS — E785 Hyperlipidemia, unspecified: Secondary | ICD-10-CM

## 2014-04-03 DIAGNOSIS — I10 Essential (primary) hypertension: Secondary | ICD-10-CM

## 2014-04-03 DIAGNOSIS — Z853 Personal history of malignant neoplasm of breast: Secondary | ICD-10-CM

## 2014-04-03 DIAGNOSIS — M779 Enthesopathy, unspecified: Secondary | ICD-10-CM | POA: Diagnosis not present

## 2014-04-03 DIAGNOSIS — E669 Obesity, unspecified: Secondary | ICD-10-CM

## 2014-04-03 DIAGNOSIS — M79671 Pain in right foot: Secondary | ICD-10-CM | POA: Diagnosis not present

## 2014-04-03 DIAGNOSIS — F32A Depression, unspecified: Secondary | ICD-10-CM

## 2014-04-03 LAB — COMPREHENSIVE METABOLIC PANEL
ALT: 16 U/L (ref 0–35)
AST: 16 U/L (ref 0–37)
Albumin: 4.3 g/dL (ref 3.5–5.2)
Alkaline Phosphatase: 76 U/L (ref 39–117)
BUN: 14 mg/dL (ref 6–23)
CO2: 31 mEq/L (ref 19–32)
Calcium: 9.4 mg/dL (ref 8.4–10.5)
Chloride: 105 mEq/L (ref 96–112)
Creatinine, Ser: 0.94 mg/dL (ref 0.40–1.20)
GFR: 77.88 mL/min (ref >60.00)
Glucose, Bld: 119 mg/dL — ABNORMAL HIGH (ref 70–99)
Potassium: 3.9 mEq/L (ref 3.5–5.1)
Sodium: 139 mEq/L (ref 135–145)
Total Bilirubin: 1.1 mg/dL (ref 0.2–1.2)
Total Protein: 7.1 g/dL (ref 6.0–8.3)

## 2014-04-03 LAB — CBC
HCT: 39.3 % (ref 36.0–46.0)
Hemoglobin: 13.4 g/dL (ref 12.0–15.0)
MCHC: 34 g/dL (ref 30.0–36.0)
MCV: 84.8 fl (ref 78.0–100.0)
Platelets: 272 10*3/uL (ref 150.0–400.0)
RBC: 4.63 Mil/uL (ref 3.87–5.11)
RDW: 13.5 % (ref 11.5–15.5)
WBC: 3.8 10*3/uL — ABNORMAL LOW (ref 4.0–10.5)

## 2014-04-03 LAB — LIPID PANEL
Cholesterol: 186 mg/dL (ref 0–200)
HDL: 59.4 mg/dL (ref >39.00)
LDL Cholesterol: 99 mg/dL (ref 0–99)
NonHDL: 126.6
Total CHOL/HDL Ratio: 3
Triglycerides: 140 mg/dL (ref 0.0–149.0)
VLDL: 28 mg/dL (ref 0.0–40.0)

## 2014-04-03 LAB — HEMOGLOBIN A1C: Hgb A1c MFr Bld: 6.4 % (ref 4.6–6.5)

## 2014-04-03 MED ORDER — TRIAMCINOLONE ACETONIDE 10 MG/ML IJ SUSP
10.0000 mg | Freq: Once | INTRAMUSCULAR | Status: AC
  Administered 2014-04-03: 10 mg

## 2014-04-03 NOTE — Progress Notes (Signed)
Pre visit review using our clinic review tool, if applicable. No additional management support is needed unless otherwise documented below in the visit note. 

## 2014-04-03 NOTE — Assessment & Plan Note (Signed)
Well controlled on Lisinopril and Amlodipine Mild swelling but HCTZ caused elevated creatinine Advised her to keep her legs elevated Will repeat CBC and CMET today

## 2014-04-03 NOTE — Progress Notes (Signed)
   Subjective:    Patient ID: Carrie Singh, female    DOB: 06-Mar-1955, 59 y.o.   MRN: [id number]  HPI Pt presents with right foot pain, achey and throbbing after prolonged walking. Tried NSAIDS and rest   Review of Systems  All other systems reviewed and are negative.      Objective:   Physical Exam        Assessment & Plan:

## 2014-04-03 NOTE — Assessment & Plan Note (Signed)
Not apparent No medications for now

## 2014-04-03 NOTE — Progress Notes (Signed)
Subjective:    Patient ID: Carrie Singh, female    DOB: [birth year], 59 y.o.   MRN: [id number]  HPI Carrie Singh is a 59 year old female who presents today for follow up of her chronic medical conditions.  She has a past medical history of hypertension, diabetes, hyperlipidemia and depression.    Diabetes: Has lost 2 pounds in last 6 months.  Tries to follow a low carb diet.  Reports eating more fruit lately.  Last eye exam was in December 2015. Does not take blood sugars at home.   HTN: Blood pressure is 132/84 today.  Reports mild swelling in her legs intermittently.  Amlodipine was added 6 months ago for better control.   HLD: Has been walking 2-3 days per week for exercise.  Denies myalgias. Increased pravastatin 6 months ago due to LDL being elevated.    Depression: denies symptoms.    She also complains of right foot pain.  Has had a bunionectomy on the right foot 10 years ago.  She has noticed swelling in the foot. She says the swelling worse when she walks and is painful.  She has an appointment with foot doctor today [facility].    Review of Systems  Constitutional: Negative for fever, chills and fatigue.  HENT: Negative.   Respiratory: Negative for cough, shortness of breath and wheezing.   Cardiovascular: Negative for chest pain, palpitations and leg swelling.  Gastrointestinal: Negative for nausea, diarrhea and constipation.  Genitourinary: Negative for dysuria, frequency and difficulty urinating.  Musculoskeletal: Negative for myalgias, back pain and gait problem.  Skin: Negative for color change, pallor, rash and wound.  Neurological: Negative for dizziness, light-headedness and headaches.  Psychiatric/Behavioral: Negative for behavioral problems and agitation. The patient is not nervous/anxious.    Past Medical History  Diagnosis Date  . Diabetes mellitus without complication   . Hypertension   . Hyperlipidemia   . Cancer     Breast   Family History  Problem  Relation Age of Onset  . Stroke Mother   . Heart disease Father   . Asthma Father   . Cancer Father     colon  . Cancer Sister     lung  . Cancer Brother     colon  . Diabetes Neg Hx    Current Outpatient Prescriptions on File Prior to Visit  Medication Sig Dispense Refill  . amLODipine (NORVASC) 5 MG tablet TAKE 1 TABLET BY MOUTH EVERY DAY 30 tablet 1  . lisinopril (PRINIVIL,ZESTRIL) 20 MG tablet Take 1 tablet (20 mg total) by mouth daily. NEEDS OFFICE VISIT FOR MORE REFILLS 30 tablet 0  . metFORMIN (GLUCOPHAGE) 1000 MG tablet Take 1 tablet (1,000 mg total) by mouth 2 (two) times daily with a meal. NEEDS OFFICE VISIT FOR MORE REFILLS 60 tablet 0  . pravastatin (PRAVACHOL) 40 MG tablet Take 1 tablet (40 mg total) by mouth daily. NEEDS OFFICE VISIT FOR MORE REFILLS 30 tablet 0   No current facility-administered medications on file prior to visit.       Objective:   Physical Exam  Constitutional: She is oriented to person, place, and time. She appears well-developed and well-nourished.  HENT:  Head: Normocephalic and atraumatic.  Neck: Normal range of motion. Neck supple.  Cardiovascular: Normal rate, regular rhythm and normal heart sounds.   Pulmonary/Chest: Effort normal and breath sounds normal.  Abdominal: Soft. Bowel sounds are normal. There is no tenderness. There is no rebound.  Musculoskeletal: Normal range of motion.  Neurological: She is alert and oriented to person, place, and time.  Skin: Skin is warm and dry.  Psychiatric: She has a normal mood and affect.          Assessment & Plan:  1. Hypertension  Currently maintained on lisinopril and amlodipine. Blood pressure today is within desirable range.  Will draw CMP today.   2. Hyperlipidemia  Currently maintained on pravastatin. Will draw lipid panel today.   3. Diabetes type 2 Currently maintained on metformin. Will draw A1C today and urine microalbumin.

## 2014-04-03 NOTE — Patient Instructions (Signed)
Diabetes and Foot Care Diabetes may cause you to have problems because of poor blood supply (circulation) to your feet and legs. This may cause the skin on your feet to become thinner, break easier, and heal more slowly. Your skin may become dry, and the skin may peel and crack. You may also have nerve damage in your legs and feet causing decreased feeling in them. You may not notice minor injuries to your feet that could lead to infections or more serious problems. Taking care of your feet is one of the most important things you can do for yourself.  HOME CARE INSTRUCTIONS  Wear shoes at all times, even in the house. Do not go barefoot. Bare feet are easily injured.  Check your feet daily for blisters, cuts, and redness. If you cannot see the bottom of your feet, use a mirror or ask someone for help.  Wash your feet with warm water (do not use hot water) and mild soap. Then pat your feet and the areas between your toes until they are completely dry. Do not soak your feet as this can dry your skin.  Apply a moisturizing lotion or petroleum jelly (that does not contain alcohol and is unscented) to the skin on your feet and to dry, brittle toenails. Do not apply lotion between your toes.  Trim your toenails straight across. Do not dig under them or around the cuticle. File the edges of your nails with an emery board or nail file.  Do not cut corns or calluses or try to remove them with medicine.  Wear clean socks or stockings every day. Make sure they are not too tight. Do not wear knee-high stockings since they may decrease blood flow to your legs.  Wear shoes that fit properly and have enough cushioning. To break in new shoes, wear them for just a few hours a day. This prevents you from injuring your feet. Always look in your shoes before you put them on to be sure there are no objects inside.  Do not cross your legs. This may decrease the blood flow to your feet.  If you find a minor scrape,  cut, or break in the skin on your feet, keep it and the skin around it clean and dry. These areas may be cleansed with mild soap and water. Do not cleanse the area with peroxide, alcohol, or iodine.  When you remove an adhesive bandage, be sure not to damage the skin around it.  If you have a wound, look at it several times a day to make sure it is healing.  Do not use heating pads or hot water bottles. They may burn your skin. If you have lost feeling in your feet or legs, you may not know it is happening until it is too late.  Make sure your health care provider performs a complete foot exam at least annually or more often if you have foot problems. Report any cuts, sores, or bruises to your health care provider immediately. SEEK MEDICAL CARE IF:   You have an injury that is not healing.  You have cuts or breaks in the skin.  You have an ingrown nail.  You notice redness on your legs or feet.  You feel burning or tingling in your legs or feet.  You have pain or cramps in your legs and feet.  Your legs or feet are numb.  Your feet always feel cold. SEEK IMMEDIATE MEDICAL CARE IF:   There is increasing redness,   swelling, or pain in or around a wound.  There is a red line that goes up your leg.  Pus is coming from a wound.  You develop a fever or as directed by your health care provider.  You notice a bad smell coming from an ulcer or wound. Document Released: 01/01/2000 Document Revised: 09/05/2012 Document Reviewed: 06/12/2012 Exit[employer] Patient Information 2015 [employer]. This information is not intended to replace advice given to you by your health care provider. Make sure you discuss any questions you have with your health care provider.  

## 2014-04-03 NOTE — Assessment & Plan Note (Signed)
Congratulated her on a 2 lb weight loss Encouraged her to continue to work on diet and exercise

## 2014-04-03 NOTE — Assessment & Plan Note (Signed)
Following yearly mammgorams

## 2014-04-03 NOTE — Assessment & Plan Note (Signed)
LDL not at goal Will repeat Lipid Profile and CMET Will adjust medication if needed Handout given on low fat, low cholesterol diet

## 2014-04-03 NOTE — Progress Notes (Signed)
Subjective:    Patient ID: Carrie Singh, female    DOB: 1955-04-21, 59 y.o.   MRN: [id number]  HPI  Pt presents to the clinic today for 6 month follow up of chronic medical conditions.  Depression: No reoccurrence at this time. Not currently medicated.  DM 2: She does not check her sugars at home. Her last A1C was 6.2%. Her microalbumin showed no proteinuria. Her last eye exam was 12/2013. She does not take flu shots. She does not take a pneumovax. She has an appt with [facility] today.  HLD: She denies myalgias on Pravastatin. Her last LDL was 167. Her Pravastatin was increased to 40 mg daily at her last visit. She does try to consume a low fat, low cholesterol diet. She is walking 2-3 days per week.  HTN: Her blood pressure was slightly elevated at her last visit. She was started on Amlodipine in addition to her Lisinopril. She never came back for her 1 month followup. She has been taking the medication as prescribed. She denies adverse effects. Her BP today is 132/84.  Obesity: She is down 2 lbs since her last visit. She is walking 2-3 days per week.  History of Breast Cancer: She had her last mammogram 12/2013. It was normal.  Review of Systems      Past Medical History  Diagnosis Date  . Diabetes mellitus without complication   . Hypertension   . Hyperlipidemia   . Cancer     Breast    Current Outpatient Prescriptions  Medication Sig Dispense Refill  . amLODipine (NORVASC) 5 MG tablet TAKE 1 TABLET BY MOUTH EVERY DAY 30 tablet 1  . lisinopril (PRINIVIL,ZESTRIL) 20 MG tablet Take 1 tablet (20 mg total) by mouth daily. NEEDS OFFICE VISIT FOR MORE REFILLS 30 tablet 0  . metFORMIN (GLUCOPHAGE) 1000 MG tablet Take 1 tablet (1,000 mg total) by mouth 2 (two) times daily with a meal. NEEDS OFFICE VISIT FOR MORE REFILLS 60 tablet 0  . pravastatin (PRAVACHOL) 40 MG tablet Take 1 tablet (40 mg total) by mouth daily. NEEDS OFFICE VISIT FOR MORE REFILLS 30 tablet 0   No  current facility-administered medications for this visit.    No Known Allergies  Family History  Problem Relation Age of Onset  . Stroke Mother   . Heart disease Father   . Asthma Father   . Cancer Father     colon  . Cancer Sister     lung  . Cancer Brother     colon  . Diabetes Neg Hx     History   Social History  . Marital Status: Married    Spouse Name: N/A  . Number of Children: N/A  . Years of Education: N/A   Occupational History  . Not on file.   Social History Main Topics  . Smoking status: Never Smoker   . Smokeless tobacco: Not on file  . Alcohol Use: No  . Drug Use: No  . Sexual Activity: Yes   Other Topics Concern  . Not on file   Social History Narrative     Constitutional: Denies fever, malaise, fatigue, headache or abrupt weight changes. Marland Kitchen Respiratory: Denies difficulty breathing, shortness of breath, cough or sputum production.   Cardiovascular: Pt reports swelling in her feet. Denies chest pain, chest tightness, palpitations or swelling in the hands.  Gastrointestinal: Denies abdominal pain, bloating, constipation, diarrhea or blood in the stool.  Skin: Denies redness, rashes, lesions or ulcercations.  Neurological: Denies  dizziness, difficulty with memory, difficulty with speech or problems with balance and coordination.  Psych: Denies anxiety, depression, SI/HI.  No other specific complaints in a complete review of systems (except as listed in HPI above).  Objective:   Physical Exam   BP 132/84 mmHg  Pulse 76  Temp(Src) 98.1 F (36.7 C) (Oral)  Wt 204 lb (92.534 kg)  SpO2 99% Wt Readings from Last 3 Encounters:  04/03/14 204 lb (92.534 kg)  11/11/13 206 lb (93.441 kg)  10/07/13 213 lb (96.616 kg)    General: Appears her  stated age, obese in NAD. Skin: Warm, dry and intact. No rashes, lesions or ulcerations noted. HEENT: Head: normal shape and size; Eyes: sclera white, no icterus, conjunctiva pink, PERRLA and EOMs intact;    Cardiovascular: Normal rate and rhythm. S1,S2 noted.  No murmur, rubs or gallops noted. Trace BLE edema. No carotid bruits noted. Pulmonary/Chest: Normal effort and positive vesicular breath sounds. No respiratory distress. No wheezes, rales or ronchi noted.  Abdomen: Soft and nontender. Normal bowel sounds, no bruits noted. No distention or masses noted.  Neurological: Alert and oriented. Sensation intact to bilateral feet. Psychiatric: Mood and affect normal.   BMET    Component Value Date/Time   NA 140 11/11/2013 1434   K 4.4 11/11/2013 1434   CL 101 11/11/2013 1434   CO2 31 11/11/2013 1434   GLUCOSE 105* 11/11/2013 1434   BUN 9 11/11/2013 1434   CREATININE 1.3* 11/11/2013 1434   CALCIUM 9.5 11/11/2013 1434   GFRNONAA 60* 01/04/2008 1001   GFRAA  01/04/2008 1001    >60        The eGFR has been calculated using the MDRD equation. This calculation has not been validated in all clinical    Lipid Panel     Component Value Date/Time   CHOL 176 11/11/2013 1434   TRIG 161.0* 11/11/2013 1434   HDL 54.70 11/11/2013 1434   CHOLHDL 3 11/11/2013 1434   VLDL 32.2 11/11/2013 1434   LDLCALC 89 11/11/2013 1434    CBC    Component Value Date/Time   WBC 5.1 09/05/2013 1337   WBC 4.2 08/13/2009 0946   RBC 4.67 09/05/2013 1337   RBC 4.24 08/13/2009 0946   HGB 13.7 09/05/2013 1337   HGB 12.8 08/13/2009 0946   HCT 41.0 09/05/2013 1337   HCT 36.8 08/13/2009 0946   PLT 280.0 09/05/2013 1337   PLT 271 08/13/2009 0946   MCV 87.9 09/05/2013 1337   MCV 87.0 08/13/2009 0946   MCH 30.2 08/13/2009 0946   MCHC 33.4 09/05/2013 1337   MCHC 34.7 08/13/2009 0946   RDW 13.8 09/05/2013 1337   RDW 13.8 08/13/2009 0946   LYMPHSABS 1.5 08/13/2009 0946   LYMPHSABS 1.6 01/04/2008 1001   MONOABS 0.2 08/13/2009 0946   MONOABS 0.3 01/04/2008 1001   EOSABS 0.1 08/13/2009 0946   EOSABS 0.1 01/04/2008 1001   BASOSABS 0.0 08/13/2009 0946   BASOSABS 0.0 01/04/2008 1001    Hgb A1C Lab Results   Component Value Date   HGBA1C 6.2 11/11/2013        Assessment & Plan:

## 2014-04-03 NOTE — Assessment & Plan Note (Signed)
Advised her to check her sugars a few times per week Will repeat A1C and Microalbumin Continue Metformin for now Continue to work on diet and exercise She declines flu and pneumonia vaccine

## 2014-04-04 LAB — MICROALBUMIN / CREATININE URINE RATIO
Creatinine,U: 447 mg/dL
Microalb Creat Ratio: 1.3 mg/g (ref 0.0–30.0)
Microalb, Ur: 6 mg/dL — ABNORMAL HIGH (ref 0.0–1.9)

## 2014-04-04 NOTE — Progress Notes (Signed)
Subjective:     Patient ID: Carrie Singh, female   DOB: Jun 16, 1955, 59 y.o.   MRN: [id number]  HPI patient presents stating she's having discomfort in her right big toe joint and she's concerned about long-term arthritis area states that it's been inflamed and hard to walk with   Review of Systems     Objective:   Physical Exam Neurovascular status found to be intact with muscle strength adequate range of motion within normal limits. Patient's right first MPJ is moderately fluid buildup with pain when the joint is moved but no indications of crepitus    Assessment:     Inflammatory capsulitis right first MPJ with discomfort and mild structural hallux limitus deformity    Plan:     H&P and x-ray reviewed. Today I did a careful injection around the first MPJ and then educated her on hallux limitus and different treatment options associated with it. Reappoint to recheck depending on symptoms

## 2014-04-17 ENCOUNTER — Encounter: Payer: Self-pay | Admitting: Podiatry

## 2014-04-17 ENCOUNTER — Ambulatory Visit (INDEPENDENT_AMBULATORY_CARE_PROVIDER_SITE_OTHER): Payer: BLUE CROSS/BLUE SHIELD | Admitting: Podiatry

## 2014-04-17 VITALS — BP 176/90 | HR 88 | Resp 13

## 2014-04-17 DIAGNOSIS — M79671 Pain in right foot: Secondary | ICD-10-CM | POA: Diagnosis not present

## 2014-04-17 DIAGNOSIS — M779 Enthesopathy, unspecified: Secondary | ICD-10-CM

## 2014-04-17 DIAGNOSIS — M205X1 Other deformities of toe(s) (acquired), right foot: Secondary | ICD-10-CM

## 2014-04-17 MED ORDER — DICLOFENAC SODIUM 75 MG PO TBEC: 75.0000 mg | DELAYED_RELEASE_TABLET | Freq: Two times a day (BID) | ORAL | Status: DC

## 2014-04-17 NOTE — Progress Notes (Signed)
Subjective:     Patient ID: Carrie Singh, female   DOB: 1955-03-30, 59 y.o.   MRN: [id number]  HPI patient states this right foot is giving me if it and making it hard for me to walk. Points to the first MPJ into the midfoot and now into the dorsal ankle area with no significant increased edema but pain upon palpation   Review of Systems     Objective:   Physical Exam Neurovascular status intact with no restriction of motion first MPJ right but pain around the joint pain in the mid foot with no specific area and into the ankle joint    Assessment:     Appears to be an inflammatory complex with the foot that's been overused and causing discomfort    Plan:     H&P and I explained condition. I applied an air fracture walker to take all pressure off the foot and instructed on ice therapy and patient will be seen back for [place] to recheck again in 4 weeks and also placed on diclofenac 75 mg twice a day

## 2014-04-27 ENCOUNTER — Other Ambulatory Visit: Payer: Self-pay | Admitting: Internal Medicine

## 2014-04-29 ENCOUNTER — Other Ambulatory Visit: Payer: Self-pay | Admitting: Internal Medicine

## 2014-05-12 ENCOUNTER — Other Ambulatory Visit: Payer: Self-pay | Admitting: Internal Medicine

## 2014-05-15 ENCOUNTER — Ambulatory Visit: Payer: BLUE CROSS/BLUE SHIELD | Admitting: Podiatry

## 2014-05-15 ENCOUNTER — Other Ambulatory Visit: Payer: BLUE CROSS/BLUE SHIELD

## 2014-05-19 ENCOUNTER — Encounter: Payer: Self-pay | Admitting: Internal Medicine

## 2014-05-20 ENCOUNTER — Other Ambulatory Visit: Payer: Self-pay | Admitting: Internal Medicine

## 2014-05-21 ENCOUNTER — Encounter: Payer: Self-pay | Admitting: Internal Medicine

## 2014-05-21 ENCOUNTER — Ambulatory Visit (INDEPENDENT_AMBULATORY_CARE_PROVIDER_SITE_OTHER): Payer: BLUE CROSS/BLUE SHIELD | Admitting: Internal Medicine

## 2014-05-21 VITALS — BP 150/100 | HR 83 | Temp 98.5°F | Ht 65.0 in | Wt 205.0 lb

## 2014-05-21 DIAGNOSIS — I10 Essential (primary) hypertension: Secondary | ICD-10-CM

## 2014-05-21 DIAGNOSIS — Z0000 Encounter for general adult medical examination without abnormal findings: Secondary | ICD-10-CM | POA: Diagnosis not present

## 2014-05-21 MED ORDER — LISINOPRIL 40 MG PO TABS: 40.0000 mg | ORAL_TABLET | Freq: Every day | ORAL | Status: DC

## 2014-05-21 NOTE — Progress Notes (Signed)
Subjective:    Patient ID: Carrie Singh, female    DOB: 03-05-1955, 59 y.o.   MRN: [id number]  HPI  Pt presents to the clinic today for her annual exam.  Flu: declines Tetanus: 2010 Pneumovax: declines Prevnar: declines Zostovax: not sure that she had chicken pox. Pap Smear: Total Hysterectomy Mammogram: 12/2013 Colon Screening: 2013 at [facility]: yearly at [facility] Dentist: Annually  Of note, her blood pressure is elevated today at 142/100. She reports it was also elevated when she went to the podiatrist in march. She is taking Norvasc and Lisinopril daily as prescribed. She denies chest pain, chest tightness or shortness of breath.  Review of Systems      Past Medical History  Diagnosis Date  . Diabetes mellitus without complication   . Hypertension   . Hyperlipidemia   . Cancer     Breast    Current Outpatient Prescriptions  Medication Sig Dispense Refill  . amLODipine (NORVASC) 5 MG tablet TAKE 1 TABLET BY MOUTH EVERY DAY 30 tablet 1  . diclofenac (VOLTAREN) 75 MG EC tablet Take 1 tablet (75 mg total) by mouth 2 (two) times daily. 50 tablet 2  . lisinopril (PRINIVIL,ZESTRIL) 20 MG tablet TAKE 1 TABLET BY MOUTH DAILY. 30 tablet 5  . metFORMIN (GLUCOPHAGE) 1000 MG tablet TAKE 1 TABLET BY MOUTH TWICE A DAY WITH A MEAL 60 tablet 5  . pravastatin (PRAVACHOL) 40 MG tablet TAKE 1 TABLET (40 MG TOTAL) BY MOUTH DAILY. 30 tablet 5   No current facility-administered medications for this visit.    No Known Allergies  Family History  Problem Relation Age of Onset  . Stroke Mother   . Heart disease Father   . Asthma Father   . Cancer Father     colon  . Cancer Sister     lung  . Cancer Brother     colon  . Diabetes Neg Hx     History   Social History  . Marital Status: Married    Spouse Name: N/A  . Number of Children: N/A  . Years of Education: N/A   Occupational History  . Not on file.   Social History Main Topics    . Smoking status: Never Smoker   . Smokeless tobacco: Not on file  . Alcohol Use: No  . Drug Use: No  . Sexual Activity: Yes   Other Topics Concern  . Not on file   Social History Narrative     Constitutional: Denies fever, malaise, fatigue, headache or abrupt weight changes.  HEENT: Denies eye pain, eye redness, ear pain, ringing in the ears, wax buildup, runny nose, nasal congestion, bloody nose, or sore throat. Respiratory: Denies difficulty breathing, shortness of breath, cough or sputum production.   Cardiovascular: Pt reports swelling in the feet. Denies chest pain, chest tightness, palpitations or swelling in the hands.  Gastrointestinal: Denies abdominal pain, bloating, constipation, diarrhea or blood in the stool.  GU: Denies urgency, frequency, pain with urination, burning sensation, blood in urine, odor or discharge. Musculoskeletal: Denies decrease in range of motion, difficulty with gait, muscle pain or joint pain and swelling.  Skin: Denies redness, rashes, lesions or ulcercations.  Neurological: Denies dizziness, difficulty with memory, difficulty with speech or problems with balance and coordination.  Psych: Denies anxiety, depression, SI/HI.  No other specific complaints in a complete review of systems (except as listed in HPI above).  Objective:   Physical Exam   BP 142/100 mmHg  Pulse 83  Temp(Src) 98.5 F (36.9 C) (Oral)  Ht _0  (1.651 m)  Wt 205 lb (92.987 kg)  BMI 34.11 kg/m2  SpO2 98% Wt Readings from Last 3 Encounters:  05/21/14 205 lb (92.987 kg)  04/03/14 204 lb (92.534 kg)  11/11/13 206 lb (93.441 kg)    General: Appears her stated age, obese in NAD. Skin: Warm, dry and intact. No rashes, lesions or ulcerations noted. HEENT: Head: normal shape and size; Eyes: sclera white, no icterus, conjunctiva pink, PERRLA and EOMs intact; Ears: Tm's gray and intact, normal light reflex; Nose: mucosa pink and moist, septum midline; Throat/Mouth: Teeth  present, mucosa pink and moist, no exudate, lesions or ulcerations noted.  Neck: Neck supple, trachea midline. No masses, lumps or thyromegaly present.  Cardiovascular: Normal rate and rhythm. S1,S2 noted.  No murmur, rubs or gallops noted. Trace BLE edema. No carotid bruits noted. Pulmonary/Chest: Normal effort and positive vesicular breath sounds. No respiratory distress. No wheezes, rales or ronchi noted.  Abdomen: Soft and nontender. Normal bowel sounds, no bruits noted. No distention or masses noted. Liver, spleen and kidneys non palpable. Musculoskeletal: Normal range of motion. Strength 5/5 BUE/BLE. No difficulty with gait.  Neurological: Alert and oriented. Cranial nerves II-XII grossly intact. Coordination normal.  Psychiatric: Mood and affect normal. Behavior is normal. Judgment and thought content normal.     BMET    Component Value Date/Time   NA 139 04/03/2014 1101   NA 143 10/12/2013 1510   K 3.9 04/03/2014 1101   K 3.9 10/12/2013 1510   CL 105 04/03/2014 1101   CL 109* 10/12/2013 1510   CO2 31 04/03/2014 1101   CO2 29 10/12/2013 1510   GLUCOSE 119* 04/03/2014 1101   GLUCOSE 92 10/12/2013 1510   BUN 14 04/03/2014 1101   BUN 16 10/12/2013 1510   CREATININE 0.94 04/03/2014 1101   CREATININE 1.07 10/12/2013 1510   CALCIUM 9.4 04/03/2014 1101   CALCIUM 9.4 10/12/2013 1510   GFRNONAA 60* 01/04/2008 1001   GFRAA  01/04/2008 1001    >60        The eGFR has been calculated using the MDRD equation. This calculation has not been validated in all clinical    Lipid Panel     Component Value Date/Time   CHOL 186 04/03/2014 1101   TRIG 140.0 04/03/2014 1101   HDL 59.40 04/03/2014 1101   CHOLHDL 3 04/03/2014 1101   VLDL 28.0 04/03/2014 1101   LDLCALC 99 04/03/2014 1101    CBC    Component Value Date/Time   WBC 3.8* 04/03/2014 1101   WBC 4.9 10/12/2013 1510   WBC 4.2 08/13/2009 0946   RBC 4.63 04/03/2014 1101   RBC 4.64 10/12/2013 1510   RBC 4.24 08/13/2009  0946   HGB 13.4 04/03/2014 1101   HGB 13.5 10/12/2013 1510   HGB 12.8 08/13/2009 0946   HCT 39.3 04/03/2014 1101   HCT 40.5 10/12/2013 1510   HCT 36.8 08/13/2009 0946   PLT 272.0 04/03/2014 1101   PLT 278 10/12/2013 1510   PLT 271 08/13/2009 0946   MCV 84.8 04/03/2014 1101   MCV 87 10/12/2013 1510   MCV 87.0 08/13/2009 0946   MCH 29.1 10/12/2013 1510   MCH 30.2 08/13/2009 0946   MCHC 34.0 04/03/2014 1101   MCHC 33.3 10/12/2013 1510   MCHC 34.7 08/13/2009 0946   RDW 13.5 04/03/2014 1101   RDW 13.6 10/12/2013 1510   RDW 13.8 08/13/2009 0946   LYMPHSABS 1.5 08/13/2009 0946  LYMPHSABS 1.6 01/04/2008 1001   MONOABS 0.2 08/13/2009 0946   MONOABS 0.3 01/04/2008 1001   EOSABS 0.1 08/13/2009 0946   EOSABS 0.1 01/04/2008 1001   BASOSABS 0.0 08/13/2009 0946   BASOSABS 0.0 01/04/2008 1001    Hgb A1C Lab Results  Component Value Date   HGBA1C 6.4 04/03/2014        Assessment & Plan:   Preventative Health Maintenance:  She declines flu, pneumovax and prevnar Labs from 03/2014 reviewed Encouraged her to work on diet and exercise Will hold off on Zostovax as she is not sure that she had chicken pox All other HM UTD  RTC in 6 months to follow up chronic condtions

## 2014-05-21 NOTE — Progress Notes (Signed)
Pre visit review using our clinic review tool, if applicable. No additional management support is needed unless otherwise documented below in the visit note. 

## 2014-05-21 NOTE — Patient Instructions (Signed)

## 2014-05-21 NOTE — Assessment & Plan Note (Signed)
Elevated today at 142/100. She was taken off HCTZ due to impaired kidney function and placed on Norvasc Mild swelling of the feet Blood pressure rechecked-150/100 Will stop Norvasc and Increase Lisinopril to 40 mg  RTC in 1 month to recheck BP

## 2014-06-19 ENCOUNTER — Telehealth: Payer: Self-pay | Admitting: *Deleted

## 2014-06-19 NOTE — Telephone Encounter (Addendum)
Pt states the area where she was injected has turned white, and asked if this was normal?  Dr. Paulla Dolly states this is a temporary reaction to the anesthetic spray and will resolve as the area is bathed and lotioned.  Left message with this infomration on pt's cellphone.

## 2014-06-23 ENCOUNTER — Ambulatory Visit (INDEPENDENT_AMBULATORY_CARE_PROVIDER_SITE_OTHER): Payer: BLUE CROSS/BLUE SHIELD | Admitting: Internal Medicine

## 2014-06-23 ENCOUNTER — Encounter: Payer: Self-pay | Admitting: Internal Medicine

## 2014-06-23 ENCOUNTER — Ambulatory Visit: Payer: BLUE CROSS/BLUE SHIELD | Admitting: Internal Medicine

## 2014-06-23 VITALS — BP 144/96 | HR 76 | Temp 98.3°F | Wt 204.0 lb

## 2014-06-23 DIAGNOSIS — I10 Essential (primary) hypertension: Secondary | ICD-10-CM

## 2014-06-23 MED ORDER — CLONIDINE HCL 0.1 MG PO TABS: 0.1000 mg | ORAL_TABLET | Freq: Two times a day (BID) | ORAL | Status: DC

## 2014-06-23 NOTE — Assessment & Plan Note (Signed)
BP still not at goal Continue Lisinopril 40 mg daily, medication refilled today Add Clonidine 0.1 mg BID  RTC in 3 weeks, if BP still not at goal, will refer to cardiology

## 2014-06-23 NOTE — Patient Instructions (Addendum)
Continue your Lisinopril 40 mg daily We will add Clonidine 0.1 mg twice a day Follow up in 3 weeks If we can not get your BP under control, you may have to go see an cardiologist Hypertension Hypertension, commonly called high blood pressure, is when the force of blood pumping through your arteries is too strong. Your arteries are the blood vessels that carry blood from your heart throughout your body. A blood pressure reading consists of a higher number over a lower number, such as 110/72. The higher number (systolic) is the pressure inside your arteries when your heart pumps. The lower number (diastolic) is the pressure inside your arteries when your heart relaxes. Ideally you want your blood pressure below 120/80. Hypertension forces your heart to work harder to pump blood. Your arteries may become narrow or stiff. Having hypertension puts you at risk for heart disease, stroke, and other problems.  RISK FACTORS Some risk factors for high blood pressure are controllable. Others are not.  Risk factors you cannot control include:   Race. You may be at higher risk if you are African American.  Age. Risk increases with age.  Gender. Men are at higher risk than women before age 4 years. After age 64, women are at higher risk than men. Risk factors you can control include:  Not getting enough exercise or physical activity.  Being overweight.  Getting too much fat, sugar, calories, or salt in your diet.  Drinking too much alcohol. SIGNS AND SYMPTOMS Hypertension does not usually cause signs or symptoms. Extremely high blood pressure (hypertensive crisis) may cause headache, anxiety, shortness of breath, and nosebleed. DIAGNOSIS  To check if you have hypertension, your health care provider will measure your blood pressure while you are seated, with your arm held at the level of your heart. It should be measured at least twice using the same arm. Certain conditions can cause a difference in  blood pressure between your right and left arms. A blood pressure reading that is higher than normal on one occasion does not mean that you need treatment. If one blood pressure reading is high, ask your health care provider about having it checked again. TREATMENT  Treating high blood pressure includes making lifestyle changes and possibly taking medicine. Living a healthy lifestyle can help lower high blood pressure. You may need to change some of your habits. Lifestyle changes may include:  Following the DASH diet. This diet is high in fruits, vegetables, and whole grains. It is low in salt, red meat, and added sugars.  Getting at least 2 hours of brisk physical activity every week.  Losing weight if necessary.  Not smoking.  Limiting alcoholic beverages.  Learning ways to reduce stress. If lifestyle changes are not enough to get your blood pressure under control, your health care provider may prescribe medicine. You may need to take more than one. Work closely with your health care provider to understand the risks and benefits. HOME CARE INSTRUCTIONS  Have your blood pressure rechecked as directed by your health care provider.   Take medicines only as directed by your health care provider. Follow the directions carefully. Blood pressure medicines must be taken as prescribed. The medicine does not work as well when you skip doses. Skipping doses also puts you at risk for problems.   Do not smoke.   Monitor your blood pressure at home as directed by your health care provider. SEEK MEDICAL CARE IF:   You think you are having a reaction to  medicines taken.  You have recurrent headaches or feel dizzy.  You have swelling in your ankles.  You have trouble with your vision. SEEK IMMEDIATE MEDICAL CARE IF:  You develop a severe headache or confusion.  You have unusual weakness, numbness, or feel faint.  You have severe chest or abdominal pain.  You vomit repeatedly.  You  have trouble breathing. MAKE SURE YOU:   Understand these instructions.  Will watch your condition.  Will get help right away if you are not doing well or get worse. Document Released: 01/03/2005 Document Revised: 05/20/2013 Document Reviewed: 10/26/2012 [facility] Patient Information 2015 [place], [place]. This information is not intended to replace advice given to you by your health care provider. Make sure you discuss any questions you have with your health care provider.

## 2014-06-23 NOTE — Progress Notes (Signed)
Pre visit review using our clinic review tool, if applicable. No additional management support is needed unless otherwise documented below in the visit note. 

## 2014-06-23 NOTE — Progress Notes (Signed)
Subjective:    Patient ID: Carrie Singh, female    DOB: Jan 31, 1955, 59 y.o.   MRN: [id number]  HPI  Pt presents to the clinic today for 1 month follow up of HTN. Her BP at her last visit was 150/100. Her Lisinopril was increased to 40 mg daily. She was taken off her Norvasc secondary to lower extremity edema. She was on HCTZ in the past but was taken off secondary to decreased kidney function. She has been taking her medications as directed. She denies chest pain or shortness of breath. Her BP today is 144/96.  Review of Systems      Past Medical History  Diagnosis Date  . Diabetes mellitus without complication   . Hypertension   . Hyperlipidemia   . Cancer     Breast    Current Outpatient Prescriptions  Medication Sig Dispense Refill  . diclofenac (VOLTAREN) 75 MG EC tablet Take 1 tablet (75 mg total) by mouth 2 (two) times daily. 50 tablet 2  . lisinopril (PRINIVIL,ZESTRIL) 40 MG tablet Take 1 tablet (40 mg total) by mouth daily. 30 tablet 1  . metFORMIN (GLUCOPHAGE) 1000 MG tablet TAKE 1 TABLET BY MOUTH TWICE A DAY WITH A MEAL 60 tablet 5  . pravastatin (PRAVACHOL) 40 MG tablet TAKE 1 TABLET (40 MG TOTAL) BY MOUTH DAILY. 30 tablet 5   No current facility-administered medications for this visit.    No Known Allergies  Family History  Problem Relation Age of Onset  . Stroke Mother   . Heart disease Father   . Asthma Father   . Cancer Father     colon  . Cancer Sister     lung  . Cancer Brother     colon  . Diabetes Neg Hx     History   Social History  . Marital Status: Married    Spouse Name: N/A  . Number of Children: N/A  . Years of Education: N/A   Occupational History  . Not on file.   Social History Main Topics  . Smoking status: Never Smoker   . Smokeless tobacco: Not on file  . Alcohol Use: No  . Drug Use: No  . Sexual Activity: Yes   Other Topics Concern  . Not on file   Social History Narrative     Constitutional: Denies fever,  malaise, fatigue, headache or abrupt weight changes.  Respiratory: Denies difficulty breathing, shortness of breath, cough or sputum production.   Cardiovascular: Denies chest pain, chest tightness, palpitations or swelling in the hands or feet.  Neurological: Denies dizziness, difficulty with memory, difficulty with speech or problems with balance and coordination.   No other specific complaints in a complete review of systems (except as listed in HPI above).  Objective:   Physical Exam   BP 144/96 mmHg  Pulse 76  Temp(Src) 98.3 F (36.8 C) (Oral)  Wt 204 lb (92.534 kg)  SpO2 98% Wt Readings from Last 3 Encounters:  06/23/14 204 lb (92.534 kg)  05/21/14 205 lb (92.987 kg)  04/03/14 204 lb (92.534 kg)    General: Appears her stated age, obese in NAD. HEENT: Head: normal shape and size; Eyes: sclera white, no icterus, conjunctiva pink, PERRLA and EOMs intact;  Cardiovascular: Normal rate and rhythm. S1,S2 noted.  No murmur, rubs or gallops noted. Trace BLE edema. Pulmonary/Chest: Normal effort and positive vesicular breath sounds. No respiratory distress. No wheezes, rales or ronchi noted.  Neurological: Alert and oriented.   BMET  Component Value Date/Time   NA 139 04/03/2014 1101   NA 143 10/12/2013 1510   K 3.9 04/03/2014 1101   K 3.9 10/12/2013 1510   CL 105 04/03/2014 1101   CL 109* 10/12/2013 1510   CO2 31 04/03/2014 1101   CO2 29 10/12/2013 1510   GLUCOSE 119* 04/03/2014 1101   GLUCOSE 92 10/12/2013 1510   BUN 14 04/03/2014 1101   BUN 16 10/12/2013 1510   CREATININE 0.94 04/03/2014 1101   CREATININE 1.07 10/12/2013 1510   CALCIUM 9.4 04/03/2014 1101   CALCIUM 9.4 10/12/2013 1510   GFRNONAA 60* 01/04/2008 1001   GFRAA  01/04/2008 1001    >60        The eGFR has been calculated using the MDRD equation. This calculation has not been validated in all clinical    Lipid Panel     Component Value Date/Time   CHOL 186 04/03/2014 1101   TRIG 140.0  04/03/2014 1101   HDL 59.40 04/03/2014 1101   CHOLHDL 3 04/03/2014 1101   VLDL 28.0 04/03/2014 1101   LDLCALC 99 04/03/2014 1101    CBC    Component Value Date/Time   WBC 3.8* 04/03/2014 1101   WBC 4.9 10/12/2013 1510   WBC 4.2 08/13/2009 0946   RBC 4.63 04/03/2014 1101   RBC 4.64 10/12/2013 1510   RBC 4.24 08/13/2009 0946   HGB 13.4 04/03/2014 1101   HGB 13.5 10/12/2013 1510   HGB 12.8 08/13/2009 0946   HCT 39.3 04/03/2014 1101   HCT 40.5 10/12/2013 1510   HCT 36.8 08/13/2009 0946   PLT 272.0 04/03/2014 1101   PLT 278 10/12/2013 1510   PLT 271 08/13/2009 0946   MCV 84.8 04/03/2014 1101   MCV 87 10/12/2013 1510   MCV 87.0 08/13/2009 0946   MCH 29.1 10/12/2013 1510   MCH 30.2 08/13/2009 0946   MCHC 34.0 04/03/2014 1101   MCHC 33.3 10/12/2013 1510   MCHC 34.7 08/13/2009 0946   RDW 13.5 04/03/2014 1101   RDW 13.6 10/12/2013 1510   RDW 13.8 08/13/2009 0946   LYMPHSABS 1.5 08/13/2009 0946   LYMPHSABS 1.6 01/04/2008 1001   MONOABS 0.2 08/13/2009 0946   MONOABS 0.3 01/04/2008 1001   EOSABS 0.1 08/13/2009 0946   EOSABS 0.1 01/04/2008 1001   BASOSABS 0.0 08/13/2009 0946   BASOSABS 0.0 01/04/2008 1001    Hgb A1C Lab Results  Component Value Date   HGBA1C 6.4 04/03/2014        Assessment & Plan:   

## 2014-07-11 ENCOUNTER — Encounter: Payer: Self-pay | Admitting: Internal Medicine

## 2014-07-29 ENCOUNTER — Other Ambulatory Visit: Payer: Self-pay | Admitting: Internal Medicine

## 2014-07-30 ENCOUNTER — Encounter: Payer: Self-pay | Admitting: Internal Medicine

## 2014-07-30 ENCOUNTER — Ambulatory Visit (INDEPENDENT_AMBULATORY_CARE_PROVIDER_SITE_OTHER): Payer: BLUE CROSS/BLUE SHIELD | Admitting: Internal Medicine

## 2014-07-30 VITALS — BP 142/96 | HR 80 | Temp 98.1°F | Wt 204.0 lb

## 2014-07-30 DIAGNOSIS — I10 Essential (primary) hypertension: Secondary | ICD-10-CM

## 2014-07-30 NOTE — Assessment & Plan Note (Signed)
Still mildly elevated She has failed HCTZ, Norvasc and Clonidine Will continue with Lisinopril at this time Will monitor BP for now If be gets higher, will refer to cardiology for further evaluation

## 2014-07-30 NOTE — Progress Notes (Signed)
Subjective:    Patient ID: Carrie Singh, female    DOB: 03-Aug-1955, 59 y.o.   MRN: [id number]  HPI  Pt presents to the clinic today for 3 week follow up of HTN. She had been taking Lisinopril and continued to have elevated blood pressures. Her last reading was 144/94. She has been asymptomatic. She was taken off HCTZ secondary to impaired renal function. She was taken off Norvasc in the past secondary to lower extremity edema. At her last visit, we added Clonidine 0.1 mg BID. She has been taking it as prescribed. She reports the Clonidine causes her to cough, feel woozy and have dry mouth. Her BP today is 142/96.  Review of Systems  Past Medical History  Diagnosis Date  . Diabetes mellitus without complication   . Hypertension   . Hyperlipidemia   . Cancer     Breast    Current Outpatient Prescriptions  Medication Sig Dispense Refill  . cloNIDine (CATAPRES) 0.1 MG tablet Take 1 tablet (0.1 mg total) by mouth 2 (two) times daily. 60 tablet 0  . diclofenac (VOLTAREN) 75 MG EC tablet Take 1 tablet (75 mg total) by mouth 2 (two) times daily. 50 tablet 2  . lisinopril (PRINIVIL,ZESTRIL) 40 MG tablet TAKE 1 TABLET BY MOUTH EVERY DAY 30 tablet 1  . metFORMIN (GLUCOPHAGE) 1000 MG tablet TAKE 1 TABLET BY MOUTH TWICE A DAY WITH A MEAL 60 tablet 5  . pravastatin (PRAVACHOL) 40 MG tablet TAKE 1 TABLET (40 MG TOTAL) BY MOUTH DAILY. 30 tablet 5   No current facility-administered medications for this visit.    No Known Allergies  Family History  Problem Relation Age of Onset  . Stroke Mother   . Heart disease Father   . Asthma Father   . Cancer Father     colon  . Cancer Sister     lung  . Cancer Brother     colon  . Diabetes Neg Hx     History   Social History  . Marital Status: Married    Spouse Name: N/A  . Number of Children: N/A  . Years of Education: N/A   Occupational History  . Not on file.   Social History Main Topics  . Smoking status: Never Smoker   .  Smokeless tobacco: Not on file  . Alcohol Use: No  . Drug Use: No  . Sexual Activity: Yes   Other Topics Concern  . Not on file   Social History Narrative     Constitutional: Denies fever, malaise, fatigue, headache or abrupt weight changes.  HEENT: Denies eye pain, eye redness, ear pain, ringing in the ears, wax buildup, runny nose, nasal congestion, bloody nose, or sore throat. Respiratory: Denies difficulty breathing, shortness of breath, cough or sputum production.   Cardiovascular: Denies chest pain, chest tightness, palpitations or swelling in the hands or feet.  Neurological: Denies dizziness, difficulty with memory, difficulty with speech or problems with balance and coordination.   No other specific complaints in a complete review of systems (except as listed in HPI above).     Objective:   Physical Exam  BP 142/96 mmHg  Pulse 80  Temp(Src) 98.1 F (36.7 C) (Oral)  Wt 204 lb (92.534 kg)  SpO2 98% Wt Readings from Last 3 Encounters:  07/30/14 204 lb (92.534 kg)  06/23/14 204 lb (92.534 kg)  05/21/14 205 lb (92.987 kg)    General: Appears her stated age, well developed, well nourished in NAD. HEENT: Head: normal  shape and size; Eyes: sclera white, no icterus, conjunctiva pink, PERRLA and EOMs intact;  Cardiovascular: Normal rate and rhythm. S1,S2 noted.  No murmur, rubs or gallops noted. No JVD or BLE edema.  Pulmonary/Chest: Normal effort and positive vesicular breath sounds. No respiratory distress. No wheezes, rales or ronchi noted.  Neurological: Alert and oriented.  BMET    Component Value Date/Time   NA 139 04/03/2014 1101   NA 143 10/12/2013 1510   K 3.9 04/03/2014 1101   K 3.9 10/12/2013 1510   CL 105 04/03/2014 1101   CL 109* 10/12/2013 1510   CO2 31 04/03/2014 1101   CO2 29 10/12/2013 1510   GLUCOSE 119* 04/03/2014 1101   GLUCOSE 92 10/12/2013 1510   BUN 14 04/03/2014 1101   BUN 16 10/12/2013 1510   CREATININE 0.94 04/03/2014 1101    CREATININE 1.07 10/12/2013 1510   CALCIUM 9.4 04/03/2014 1101   CALCIUM 9.4 10/12/2013 1510   GFRNONAA 60* 01/04/2008 1001   GFRAA  01/04/2008 1001    >60        The eGFR has been calculated using the MDRD equation. This calculation has not been validated in all clinical    Lipid Panel     Component Value Date/Time   CHOL 186 04/03/2014 1101   TRIG 140.0 04/03/2014 1101   HDL 59.40 04/03/2014 1101   CHOLHDL 3 04/03/2014 1101   VLDL 28.0 04/03/2014 1101   LDLCALC 99 04/03/2014 1101    CBC    Component Value Date/Time   WBC 3.8* 04/03/2014 1101   WBC 4.9 10/12/2013 1510   WBC 4.2 08/13/2009 0946   RBC 4.63 04/03/2014 1101   RBC 4.64 10/12/2013 1510   RBC 4.24 08/13/2009 0946   HGB 13.4 04/03/2014 1101   HGB 13.5 10/12/2013 1510   HGB 12.8 08/13/2009 0946   HCT 39.3 04/03/2014 1101   HCT 40.5 10/12/2013 1510   HCT 36.8 08/13/2009 0946   PLT 272.0 04/03/2014 1101   PLT 278 10/12/2013 1510   PLT 271 08/13/2009 0946   MCV 84.8 04/03/2014 1101   MCV 87 10/12/2013 1510   MCV 87.0 08/13/2009 0946   MCH 29.1 10/12/2013 1510   MCH 30.2 08/13/2009 0946   MCHC 34.0 04/03/2014 1101   MCHC 33.3 10/12/2013 1510   MCHC 34.7 08/13/2009 0946   RDW 13.5 04/03/2014 1101   RDW 13.6 10/12/2013 1510   RDW 13.8 08/13/2009 0946   LYMPHSABS 1.5 08/13/2009 0946   LYMPHSABS 1.6 01/04/2008 1001   MONOABS 0.2 08/13/2009 0946   MONOABS 0.3 01/04/2008 1001   EOSABS 0.1 08/13/2009 0946   EOSABS 0.1 01/04/2008 1001   BASOSABS 0.0 08/13/2009 0946   BASOSABS 0.0 01/04/2008 1001    Hgb A1C Lab Results  Component Value Date   HGBA1C 6.4 04/03/2014         Assessment & Plan:

## 2014-07-30 NOTE — Patient Instructions (Signed)

## 2014-07-30 NOTE — Progress Notes (Signed)
Pre visit review using our clinic review tool, if applicable. No additional management support is needed unless otherwise documented below in the visit note. 

## 2014-10-03 ENCOUNTER — Other Ambulatory Visit: Payer: Self-pay | Admitting: Internal Medicine

## 2014-10-11 ENCOUNTER — Encounter (HOSPITAL_BASED_OUTPATIENT_CLINIC_OR_DEPARTMENT_OTHER): Payer: Self-pay | Admitting: Emergency Medicine

## 2014-10-11 ENCOUNTER — Emergency Department (HOSPITAL_BASED_OUTPATIENT_CLINIC_OR_DEPARTMENT_OTHER)
Admission: EM | Admit: 2014-10-11 | Discharge: 2014-10-12 | Disposition: A | Discharge status: Home / Self Care | Payer: BLUE CROSS/BLUE SHIELD | Attending: Emergency Medicine | Admitting: Emergency Medicine

## 2014-10-11 ENCOUNTER — Emergency Department (HOSPITAL_BASED_OUTPATIENT_CLINIC_OR_DEPARTMENT_OTHER): Payer: BLUE CROSS/BLUE SHIELD

## 2014-10-11 DIAGNOSIS — I10 Essential (primary) hypertension: Secondary | ICD-10-CM | POA: Insufficient documentation

## 2014-10-11 DIAGNOSIS — E785 Hyperlipidemia, unspecified: Secondary | ICD-10-CM | POA: Insufficient documentation

## 2014-10-11 DIAGNOSIS — E119 Type 2 diabetes mellitus without complications: Secondary | ICD-10-CM | POA: Insufficient documentation

## 2014-10-11 DIAGNOSIS — Z791 Long term (current) use of non-steroidal anti-inflammatories (NSAID): Secondary | ICD-10-CM | POA: Diagnosis not present

## 2014-10-11 DIAGNOSIS — R200 Anesthesia of skin: Secondary | ICD-10-CM | POA: Diagnosis present

## 2014-10-11 DIAGNOSIS — Z853 Personal history of malignant neoplasm of breast: Secondary | ICD-10-CM | POA: Diagnosis not present

## 2014-10-11 DIAGNOSIS — Z79899 Other long term (current) drug therapy: Secondary | ICD-10-CM | POA: Insufficient documentation

## 2014-10-11 DIAGNOSIS — I160 Hypertensive urgency: Secondary | ICD-10-CM

## 2014-10-11 LAB — CBC WITH DIFFERENTIAL/PLATELET
Basophils Absolute: 0 10*3/uL (ref 0.0–0.1)
Basophils Relative: 1 %
Eosinophils Absolute: 0.1 10*3/uL (ref 0.0–0.7)
Eosinophils Relative: 2 %
HCT: 37.4 % (ref 36.0–46.0)
Hemoglobin: 12.7 g/dL (ref 12.0–15.0)
Lymphocytes Relative: 44 %
Lymphs Abs: 2.1 10*3/uL (ref 0.7–4.0)
MCH: 29 pg (ref 26.0–34.0)
MCHC: 34 g/dL (ref 30.0–36.0)
MCV: 85.4 fL (ref 78.0–100.0)
Monocytes Absolute: 0.3 10*3/uL (ref 0.1–1.0)
Monocytes Relative: 7 %
Neutro Abs: 2.2 10*3/uL (ref 1.7–7.7)
Neutrophils Relative %: 46 %
Platelets: 225 10*3/uL (ref 150–400)
RBC: 4.38 MIL/uL (ref 3.87–5.11)
RDW: 12.9 % (ref 11.5–15.5)
WBC: 4.8 10*3/uL (ref 4.0–10.5)

## 2014-10-11 LAB — CBG MONITORING, ED: Glucose-Capillary: 149 mg/dL — ABNORMAL HIGH (ref 65–99)

## 2014-10-11 NOTE — ED Notes (Addendum)
Dr. Florina Ou at [facility], family present at [facility]. Pt alert, NAD, calm, interactive.

## 2014-10-11 NOTE — ED Notes (Signed)
Pt smiling, alert, NAD, calm, speech clear, to CT via stretcher.

## 2014-10-11 NOTE — ED Provider Notes (Signed)
CSN: [id number]     Arrival date & time 10/11/14  2241 History  This chart was scribed for Carrie Rosser, MD by Meriel Pica, ED Scribe. This patient was seen in room MH10/MH10 and the patient's care was started 11:08 PM.   Chief Complaint  Patient presents with  . Numbness   The history is provided by the patient. No language interpreter was used.   HPI Comments: Carrie Singh is a 59 y.o. female, with a PMhx of DM, HTN, and HLD, who presents to the Emergency Department complaining of poorly controlled hypertension and associated symptoms onset for the past week, worse today about 3 PM. Her blood pressure on arrival was noted to be 204/115. She reports associated symptoms of an intermittent tingling sensation of the left face, fatigue, and an intermittent feeling of imbalance. Symptoms are vaguely described and not severe. She is followed by Webb Silversmith, NP at [facility]. Denies headache, vertigo, chest pain, SOB, nausea, vomiting or diarrhea.   Past Medical History  Diagnosis Date  . Diabetes mellitus without complication   . Hypertension   . Hyperlipidemia   . Cancer     Breast   Past Surgical History  Procedure Laterality Date  . Abdominal hysterectomy    . Vocal cord nodules    . Cyst excision    . Ovary tumor    . Breast lumpectomy    . Foot surgery     Family History  Problem Relation Age of Onset  . Stroke Mother   . Heart disease Father   . Asthma Father   . Cancer Father     colon  . Cancer Sister     lung  . Cancer Brother     colon  . Diabetes Neg Hx    Social History  Substance Use Topics  . Smoking status: Never Smoker   . Smokeless tobacco: None  . Alcohol Use: No   OB History    No data available     Review of Systems 10 Systems reviewed and all are negative for acute change except as noted in the HPI.  Allergies  Review of patient's allergies indicates no known allergies.  Home Medications   Prior to Admission medications    Medication Sig Start Date End Date Taking? Authorizing Provider  cloNIDine (CATAPRES) 0.1 MG tablet Take 1 tablet (0.1 mg total) by mouth 2 (two) times daily. 06/23/14   Jearld Fenton, NP  diclofenac (VOLTAREN) 75 MG EC tablet Take 1 tablet (75 mg total) by mouth 2 (two) times daily. 04/17/14   Wallene Huh, DPM  lisinopril (PRINIVIL,ZESTRIL) 40 MG tablet Take 1 tablet (40 mg total) by mouth daily. SCHEDULE FOLLOW UP OFFICE VISIT FOR December 10/03/14   Jearld Fenton, NP  metFORMIN (GLUCOPHAGE) 1000 MG tablet TAKE 1 TABLET BY MOUTH TWICE A DAY WITH A MEAL 07/30/14   Jearld Fenton, NP  pravastatin (PRAVACHOL) 40 MG tablet TAKE 1 TABLET (40 MG TOTAL) BY MOUTH DAILY. 04/28/14   Jearld Fenton, NP   BP 204/115 mmHg  Pulse 89  Temp(Src) 98.5 F (36.9 C) (Oral)  Resp 18  Ht 5\' 5"  (1.651 m)  Wt 205 lb (92.987 kg)  BMI 34.11 kg/m2  SpO2 100% Physical Exam General: Well-developed, well-nourished female in no acute distress; appearance consistent with age of record HENT: normocephalic; atraumatic Eyes: pupils equal, round and reactive to light; extraocular muscles intact; arcus senilis bilaterally  Neck: supple Heart: regular rate and rhythm; no murmurs,  rubs or gallops Lungs: clear to auscultation bilaterally Chest: tender spot on upper sternum  Abdomen: soft; nondistended; nontender; no masses or hepatosplenomegaly; bowel sounds present Extremities: No deformity; full range of motion; pulses normal Neurologic: Awake, alert and oriented; motor function intact in all extremities and symmetric; no facial droop; negative Romberg; normal finger-nose bilaterally; normal gait Skin: Warm and dry Psychiatric: Normal mood and affect   ED Course  Procedures  DIAGNOSTIC STUDIES: Oxygen Saturation is 100% on RA, normal by my interpretation.    COORDINATION OF CARE: 11:17 PM Discussed treatment plan with pt at bedside and pt agreed to plan.    MDM   Nursing notes and vitals signs, including  pulse oximetry, reviewed.  Summary of this visit's results, reviewed by myself:  Labs:  Results for orders placed or performed during the hospital encounter of 10/11/14 (from the past 24 hour(s))  CBG monitoring, ED     Status: Abnormal   Collection Time: 10/11/14 10:59 PM  Result Value Ref Range   Glucose-Capillary 149 (H) 65 - 99 mg/dL  CBC with Differential/Platelet     Status: None   Collection Time: 10/11/14 11:43 PM  Result Value Ref Range   WBC 4.8 4.0 - 10.5 K/uL   RBC 4.38 3.87 - 5.11 MIL/uL   Hemoglobin 12.7 12.0 - 15.0 g/dL   HCT 37.4 36.0 - 46.0 %   MCV 85.4 78.0 - 100.0 fL   MCH 29.0 26.0 - 34.0 pg   MCHC 34.0 30.0 - 36.0 g/dL   RDW 12.9 11.5 - 15.5 %   Platelets 225 150 - 400 K/uL   Neutrophils Relative % 46 %   Neutro Abs 2.2 1.7 - 7.7 K/uL   Lymphocytes Relative 44 %   Lymphs Abs 2.1 0.7 - 4.0 K/uL   Monocytes Relative 7 %   Monocytes Absolute 0.3 0.1 - 1.0 K/uL   Eosinophils Relative 2 %   Eosinophils Absolute 0.1 0.0 - 0.7 K/uL   Basophils Relative 1 %   Basophils Absolute 0.0 0.0 - 0.1 K/uL  Basic metabolic panel     Status: Abnormal   Collection Time: 10/11/14 11:43 PM  Result Value Ref Range   Sodium 141 135 - 145 mmol/L   Potassium 3.7 3.5 - 5.1 mmol/L   Chloride 106 101 - 111 mmol/L   CO2 29 22 - 32 mmol/L   Glucose, Bld 127 (H) 65 - 99 mg/dL   BUN 15 6 - 20 mg/dL   Creatinine, Ser 1.19 (H) 0.44 - 1.00 mg/dL   Calcium 9.3 8.9 - 10.3 mg/dL   GFR calc non Af Amer 48 (L) >60 mL/min   GFR calc Af Amer 56 (L) >60 mL/min   Anion gap 6 5 - 15  Troponin I     Status: None   Collection Time: 10/11/14 11:43 PM  Result Value Ref Range   Troponin I <0.03 <0.031 ng/mL    Imaging Studies: Ct Head Wo Contrast  10/12/2014   CLINICAL DATA:  Hypertension onset 8 hours ago. Tingling radiating down the left arm, fatigue, loss of balance, and left-sided facial numbness.  EXAM: CT HEAD WITHOUT CONTRAST  TECHNIQUE: Contiguous axial images were obtained from the  base of the skull through the vertex without intravenous contrast.  COMPARISON:  10/12/2013  FINDINGS: Ventricles and sulci are symmetrical. No mass effect or midline shift. No abnormal extra-axial fluid collections. Gray-white matter junctions are distinct. Basal cisterns are not effaced. No evidence of acute intracranial hemorrhage. No depressed  skull fractures. Visualized paranasal sinuses and mastoid air cells are not opacified.  IMPRESSION: No acute intracranial abnormalities.   Electronically Signed   By: Lucienne Capers M.D.   On: 10/12/2014 00:15   1:17 AM Patient's blood pressure significantly improved after IV labetalol. Patient denies any symptoms at this time. She was able to ambulate around the department without any sense of being off balance. She would like to go home and will contact her PCP tomorrow morning for further evaluation.  I personally performed the services described in this documentation, which was scribed in my presence. The recorded information has been reviewed and is accurate.   Carrie Rosser, MD 10/12/14 [phone]

## 2014-10-11 NOTE — ED Notes (Signed)
Patient reports that she is having a mild headache today, reports that she started to have left facial numbness starting at approximately at 3 pm

## 2014-10-12 ENCOUNTER — Encounter: Payer: Self-pay | Admitting: Internal Medicine

## 2014-10-12 LAB — BASIC METABOLIC PANEL
Anion gap: 6 (ref 5–15)
BUN: 15 mg/dL (ref 6–20)
CO2: 29 mmol/L (ref 22–32)
Calcium: 9.3 mg/dL (ref 8.9–10.3)
Chloride: 106 mmol/L (ref 101–111)
Creatinine, Ser: 1.19 mg/dL — ABNORMAL HIGH (ref 0.44–1.00)
GFR calc Af Amer: 56 mL/min — ABNORMAL LOW (ref >60)
GFR calc non Af Amer: 48 mL/min — ABNORMAL LOW (ref >60)
Glucose, Bld: 127 mg/dL — ABNORMAL HIGH (ref 65–99)
Potassium: 3.7 mmol/L (ref 3.5–5.1)
Sodium: 141 mmol/L (ref 135–145)

## 2014-10-12 LAB — TROPONIN I: Troponin I: <0.03 ng/mL (ref <0.031)

## 2014-10-12 MED ORDER — LABETALOL HCL 5 MG/ML IV SOLN
20.0000 mg | Freq: Once | INTRAVENOUS | Status: AC
  Administered 2014-10-12: 20 mg via INTRAVENOUS
  Filled 2014-10-12: qty 4

## 2014-10-12 NOTE — ED Notes (Signed)
Pt tolerated ambulating in h/w, steady gait, denies sob or dizziness, HA improved, "just feel tired". VSS, BP improved. Husband at [facility].

## 2014-10-13 ENCOUNTER — Telehealth: Payer: Self-pay | Admitting: *Deleted

## 2014-10-13 NOTE — Telephone Encounter (Signed)
I would like her to have a ER followup by Wednesday. Please schedule.

## 2014-10-13 NOTE — Telephone Encounter (Signed)
Message left on voicemail that patient was seen in the ED and needs to schedule a follow-up appointment with you. Mr. Carrie Singh left a voicemail that patient is dizzy today and wants to know how soon should she follow-up with you?

## 2014-10-13 NOTE — Telephone Encounter (Signed)
Patient scheduled on 10/14/14 at 1:30.

## 2014-10-14 ENCOUNTER — Ambulatory Visit (INDEPENDENT_AMBULATORY_CARE_PROVIDER_SITE_OTHER): Payer: BLUE CROSS/BLUE SHIELD | Admitting: Internal Medicine

## 2014-10-14 ENCOUNTER — Encounter: Payer: Self-pay | Admitting: Internal Medicine

## 2014-10-14 VITALS — BP 158/100 | HR 81 | Temp 98.6°F | Wt 204.0 lb

## 2014-10-14 DIAGNOSIS — I10 Essential (primary) hypertension: Secondary | ICD-10-CM | POA: Diagnosis not present

## 2014-10-14 MED ORDER — CLONIDINE HCL 0.1 MG PO TABS: 0.1000 mg | ORAL_TABLET | Freq: Three times a day (TID) | ORAL | Status: DC

## 2014-10-14 NOTE — Progress Notes (Signed)
Pre visit review using our clinic review tool, if applicable. No additional management support is needed unless otherwise documented below in the visit note. 

## 2014-10-14 NOTE — Progress Notes (Signed)
Subjective:    Patient ID: Carrie Singh, female    DOB: 04/03/1955, 59 y.o.   MRN: [id number]  HPI  Pt presents to the clinic today for ER follow up for HTN. She went to the ER with headache, dizziness, fatigue, left side facial numbness, chest tightness, and difficulty walking.  Her BP at the ER was 204/115. She had a head CT which was negative for stroke. ECG was not concerning for a STEMI. She was given IV Labetalol and advised to follow up with her PCP. She is taking Lisinopril 40 mg daily. She has Atenolol, Amlodipine, HCTZ, and Nifedapine in the past but reports she had adverse effects from each on of them. Norvasc caused increased LE edema. HCTZ caused elevated creatinine. Clonidine causes dry mouth and fatigue. Lisinopril is giving her a cough. She continues to have a headache and been intermittently dizzy. She denies chest pain or shortness of breath.  Review of Systems      Past Medical History  Diagnosis Date  . Diabetes mellitus without complication   . Hypertension   . Hyperlipidemia   . Cancer     Breast    Current Outpatient Prescriptions  Medication Sig Dispense Refill  . cloNIDine (CATAPRES) 0.1 MG tablet Take 1 tablet (0.1 mg total) by mouth 2 (two) times daily. 60 tablet 0  . diclofenac (VOLTAREN) 75 MG EC tablet Take 1 tablet (75 mg total) by mouth 2 (two) times daily. 50 tablet 2  . lisinopril (PRINIVIL,ZESTRIL) 40 MG tablet Take 1 tablet (40 mg total) by mouth daily. SCHEDULE FOLLOW UP OFFICE VISIT FOR December 30 tablet 3  . metFORMIN (GLUCOPHAGE) 1000 MG tablet TAKE 1 TABLET BY MOUTH TWICE A DAY WITH A MEAL 60 tablet 5  . pravastatin (PRAVACHOL) 40 MG tablet TAKE 1 TABLET (40 MG TOTAL) BY MOUTH DAILY. 30 tablet 5   No current facility-administered medications for this visit.    No Known Allergies  Family History  Problem Relation Age of Onset  . Stroke Mother   . Heart disease Father   . Asthma Father   . Cancer Father     colon  . Cancer Sister     lung  . Cancer Brother     colon  . Diabetes Neg Hx     Social History   Social History  . Marital Status: Married    Spouse Name: N/A  . Number of Children: N/A  . Years of Education: N/A   Occupational History  . Not on file.   Social History Main Topics  . Smoking status: Never Smoker   . Smokeless tobacco: Not on file  . Alcohol Use: No  . Drug Use: No  . Sexual Activity: Yes   Other Topics Concern  . Not on file   Social History Narrative     Constitutional: Pt reports fatigue, headache, Denies fever, malaise, or abrupt weight changes.  HEENT: Denies eye pain, eye redness, ear pain, ringing in the ears, wax buildup, runny nose, nasal congestion, bloody nose, or sore throat. Respiratory: Denies difficulty breathing, shortness of breath, cough or sputum production.   Cardiovascular: Pt reports LE edema. Denies chest pain, chest tightness, palpitations or swelling in the hands.  Neurological: Pt reports dizziness. Denies difficulty with memory, difficulty with speech or problems with balance and coordination.    No other specific complaints in a complete review of systems (except as listed in HPI above).  Objective:   Physical Exam  BP 158/100 mmHg  Pulse 81  Temp(Src) 98.6 F (37 C) (Oral)  Wt 204 lb (92.534 kg)  SpO2 98% Wt Readings from Last 3 Encounters:  10/14/14 204 lb (92.534 kg)  10/11/14 205 lb (92.987 kg)  07/30/14 204 lb (92.534 kg)    General: Appears her stated age, well developed, well nourished in NAD. HEENT: Head: normal shape and size; Eyes: sclera white, no icterus, conjunctiva pink, PERRLA and EOMs intact;  Cardiovascular: Normal rate and rhythm. S1,S2 noted.  No murmur, rubs or gallops noted.  Pulmonary/Chest: Normal effort and positive vesicular breath sounds. No respiratory distress. No wheezes, rales or ronchi noted.  Neurological: Alert and oriented. Coordination normal.    BMET    Component Value Date/Time   NA 141  10/11/2014 2343   NA 143 10/12/2013 1510   K 3.7 10/11/2014 2343   K 3.9 10/12/2013 1510   CL 106 10/11/2014 2343   CL 109* 10/12/2013 1510   CO2 29 10/11/2014 2343   CO2 29 10/12/2013 1510   GLUCOSE 127* 10/11/2014 2343   GLUCOSE 92 10/12/2013 1510   BUN 15 10/11/2014 2343   BUN 16 10/12/2013 1510   CREATININE 1.19* 10/11/2014 2343   CREATININE 1.07 10/12/2013 1510   CALCIUM 9.3 10/11/2014 2343   CALCIUM 9.4 10/12/2013 1510   GFRNONAA 48* 10/11/2014 2343   GFRNONAA 56* 10/12/2013 1510   GFRAA 56* 10/11/2014 2343   GFRAA >60 10/12/2013 1510    Lipid Panel     Component Value Date/Time   CHOL 186 04/03/2014 1101   TRIG 140.0 04/03/2014 1101   HDL 59.40 04/03/2014 1101   CHOLHDL 3 04/03/2014 1101   VLDL 28.0 04/03/2014 1101   LDLCALC 99 04/03/2014 1101    CBC    Component Value Date/Time   WBC 4.8 10/11/2014 2343   WBC 4.9 10/12/2013 1510   WBC 4.2 08/13/2009 0946   RBC 4.38 10/11/2014 2343   RBC 4.64 10/12/2013 1510   RBC 4.24 08/13/2009 0946   HGB 12.7 10/11/2014 2343   HGB 13.5 10/12/2013 1510   HGB 12.8 08/13/2009 0946   HCT 37.4 10/11/2014 2343   HCT 40.5 10/12/2013 1510   HCT 36.8 08/13/2009 0946   PLT 225 10/11/2014 2343   PLT 278 10/12/2013 1510   PLT 271 08/13/2009 0946   MCV 85.4 10/11/2014 2343   MCV 87 10/12/2013 1510   MCV 87.0 08/13/2009 0946   MCH 29.0 10/11/2014 2343   MCH 29.1 10/12/2013 1510   MCH 30.2 08/13/2009 0946   MCHC 34.0 10/11/2014 2343   MCHC 33.3 10/12/2013 1510   MCHC 34.7 08/13/2009 0946   RDW 12.9 10/11/2014 2343   RDW 13.6 10/12/2013 1510   RDW 13.8 08/13/2009 0946   LYMPHSABS 2.1 10/11/2014 2343   LYMPHSABS 1.5 08/13/2009 0946   MONOABS 0.3 10/11/2014 2343   MONOABS 0.2 08/13/2009 0946   EOSABS 0.1 10/11/2014 2343   EOSABS 0.1 08/13/2009 0946   BASOSABS 0.0 10/11/2014 2343   BASOSABS 0.0 08/13/2009 0946    Hgb A1C Lab Results  Component Value Date   HGBA1C 6.4 04/03/2014         Assessment & Plan:   ER  followup for Malignant HTN:  ER notes, imaging and labs reviewed Will continue Lisinopril Increase Clonidine to TID Will place referral to cardiology for further evaluation and treatment Start baby ASA daily  Will follow up after you see cardiology- see marion on the way out to schedule

## 2014-10-14 NOTE — Patient Instructions (Signed)

## 2014-10-17 ENCOUNTER — Encounter: Payer: Self-pay | Admitting: Physician Assistant

## 2014-10-17 ENCOUNTER — Ambulatory Visit (INDEPENDENT_AMBULATORY_CARE_PROVIDER_SITE_OTHER): Payer: BLUE CROSS/BLUE SHIELD | Admitting: Physician Assistant

## 2014-10-17 VITALS — BP 154/108 | HR 79 | Ht 66.5 in | Wt 208.8 lb

## 2014-10-17 DIAGNOSIS — C50911 Malignant neoplasm of unspecified site of right female breast: Secondary | ICD-10-CM

## 2014-10-17 DIAGNOSIS — R079 Chest pain, unspecified: Secondary | ICD-10-CM

## 2014-10-17 DIAGNOSIS — R0602 Shortness of breath: Secondary | ICD-10-CM

## 2014-10-17 DIAGNOSIS — I10 Essential (primary) hypertension: Secondary | ICD-10-CM

## 2014-10-17 DIAGNOSIS — R5382 Chronic fatigue, unspecified: Secondary | ICD-10-CM

## 2014-10-17 DIAGNOSIS — E785 Hyperlipidemia, unspecified: Secondary | ICD-10-CM

## 2014-10-17 MED ORDER — HYDRALAZINE HCL 50 MG PO TABS: 50.0000 mg | ORAL_TABLET | Freq: Three times a day (TID) | ORAL | Status: DC

## 2014-10-17 MED ORDER — ISOSORBIDE MONONITRATE ER 30 MG PO TB24: 30.0000 mg | ORAL_TABLET | Freq: Every day | ORAL | Status: DC

## 2014-10-17 NOTE — Patient Instructions (Addendum)
Medication Instructions:  Your physician has recommended you make the following change in your medication:  TAPER clonidine . Take twice a day for 2 days then discontinue on Sunday. STOP taking lisinopril START taking hydralazine 50mg  three times a day START taking Imdur 30mg  once per day    Labwork: Your physician recommends that you have labs today: BMET, lipid, A1C, cortisol, A&A   Testing/Procedures: Your physician has requested that you have an echocardiogram. Echocardiography is a painless test that uses sound waves to create images of your heart. It provides your doctor with information about the size and shape of your heart and how well your heart's chambers and valves are working. This procedure takes approximately one hour. There are no restrictions for this procedure.  Your physician has requested that you have a renal artery duplex. During this test, an ultrasound is used to evaluate blood flow to the kidneys. Allow one hour for this exam. Do not eat after midnight the day before and avoid carbonated beverages. Take your medications as you usually do.    Your physician has requested that you have a lexiscan myoview. For further information please visit [email or web address]. Please follow instruction sheet, as given.  [facility]  Your caregiver has ordered a Stress Test with nuclear imaging. The purpose of this test is to evaluate the blood supply to your heart muscle. This procedure is referred to as a "Non-Invasive Stress Test." This is because other than having an IV started in your vein, nothing is inserted or "invades" your body. Cardiac stress tests are done to find areas of poor blood flow to the heart by determining the extent of coronary artery disease (CAD). Some patients exercise on a treadmill, which naturally increases the blood flow to your heart, while others who are  unable to walk on a treadmill due to physical limitations have a pharmacologic/chemical stress  agent called Lexiscan . This medicine will mimic walking on a treadmill by temporarily increasing your coronary blood flow.   Please note: these test may take anywhere between 2-4 hours to complete  PLEASE REPORT TO [place] TO GO  Date of Procedure: Wed, Oct 5, 7:30am Arrival Time for Procedure: 7:00am  Instructions regarding medication:   __xx : Hold metformin 24 hours before and 48 hours after  procedure   PLEASE NOTIFY THE OFFICE AT LEAST 24 HOURS IN ADVANCE IF YOU ARE UNABLE TO KEEP YOUR APPOINTMENT.  [phone] AND  PLEASE NOTIFY NUCLEAR MEDICINE AT [facility] AT LEAST 24 HOURS IN ADVANCE IF YOU ARE UNABLE TO KEEP YOUR APPOINTMENT. [phone]  How to prepare for your Myoview test:   Do not eat or drink after midnight  No caffeine for 24 hours prior to test  No smoking 24 hours prior to test.  Your medication may be taken with water.  If your doctor stopped a medication because of this test, do not take that medication.  Ladies, please do not wear dresses.  Skirts or pants are appropriate. Please wear a short sleeve shirt.  No perfume, cologne or lotion.  Wear comfortable walking shoes. No heels!            Follow-Up: Your physician recommends that you schedule a follow-up appointment with Christell Faith, PA-C after all testing completed (approx one month)   Any Other Special Instructions Will Be Listed Below (If Applicable).   Echocardiogram An echocardiogram, or echocardiography, uses sound waves (ultrasound) to produce  an image of your heart. The echocardiogram is simple, painless, obtained within a short period of time, and offers valuable information to your health care provider. The images from an echocardiogram can provide information such as:  Evidence of coronary artery disease (CAD).  Heart size.  Heart muscle function.  Heart valve function.  Aneurysm detection.  Evidence  of a past heart attack.  Fluid buildup around the heart.  Heart muscle thickening.  Assess heart valve function. LET [facility] CARE PROVIDER KNOW ABOUT:  Any allergies you have.  All medicines you are taking, including vitamins, herbs, eye drops, creams, and over-the-counter medicines.  Previous problems you or members of your family have had with the use of anesthetics.  Any blood disorders you have.  Previous surgeries you have had.  Medical conditions you have.  Possibility of pregnancy, if this applies. BEFORE THE PROCEDURE  No special preparation is needed. Eat and drink normally.  PROCEDURE   In order to produce an image of your heart, gel will be applied to your chest and a wand-like tool (transducer) will be moved over your chest. The gel will help transmit the sound waves from the transducer. The sound waves will harmlessly bounce off your heart to allow the heart images to be captured in real-time motion. These images will then be recorded.  You may need an IV to receive a medicine that improves the quality of the pictures. AFTER THE PROCEDURE You may return to your normal schedule including diet, activities, and medicines, unless your health care provider tells you otherwise. Document Released: 01/01/2000 Document Revised: 05/20/2013 Document Reviewed: 09/10/2012 [facility] Patient Information 2015 [place], [place]. This information is not intended to replace advice given to you by your health care provider. Make sure you discuss any questions you have with your health care provider. Nuclear Medicine Exam A nuclear medicine exam is a safe and painless imaging test. It helps to detect and diagnose disease in the body as well as provide information about organ function and structure.  Nuclear scans are most often done of the:  Lungs.  Heart.  Thyroid gland.  Bones.  Abdomen. HOW A NUCLEAR MEDICINE EXAM WORKS A nuclear medicine exam works by using a radioactive  tracer. The material is given either by an IV (intravenous) injection or it may be swallowed. After the tracer is in the body, it is absorbed by your body's organs. A large scanning machine that uses a special camera detects the radioactivity in your body. A computerized image is then formed regarding the area of concern. The small amounts of radioactive material used in a nuclear medicine exam are found to be medically safe. However, because radioactive material is used, this test is not done if you are pregnant or nursing.  BEFORE THE PROCEDURE  If available, bring previous imaging studies such as x-rays, etc. with you to the exam.  Arrive early for your exam. PROCEDURE  An IV may be started before the exam begins.  Depending on the type of examination, will lie on a table or sit in a chair during the exam.  The nuclear medicine exam will take about 30 to 60 minutes to complete. AFTER THE PROCEDURE  After your scan is completed, the image(s) will be evaluated by a specialist. It is important that you follow up with your caregiver to find out your test results.  You may return to your regular activity as instructed by your caregiver. SEEK IMMEDIATE MEDICAL CARE IF: You have shortness of breath  or difficulty breathing. MAKE SURE YOU:   Understand these instructions.  Will watch your condition.  Will get help right away if you are not doing well or get worse. Document Released: 02/11/2004 Document Revised: 03/28/2011 Document Reviewed: 03/27/2008 [facility] Patient Information 2015 [place], [place]. This information is not intended to replace advice given to you by your health care provider. Make sure you discuss any questions you have with your health care provider.

## 2014-10-17 NOTE — Progress Notes (Signed)
Cardiology Office Note:  Date of Encounter: 10/17/2014  ID: Carrie Singh, DOB 07-01-1955, MRN [id number]  PCP:  Webb Silversmith, NP Primary Cardiologist:  Dr. Fletcher Anon, MD  Chief Complaint  Patient presents with  . other    Ref by Webb Silversmith, NP for elevated BP. Pt. c/o chest pain at times and can't seem to get BP under control. Meds reviewed by the patient verbally.     HPI:  59 year old female with history of DM2, HTN dating back to her 48's previously on atenolol, amlodipine, HCTZ, clonidine, and nifedapine though had to stop 2/2 intolerances described below, HLD, right breast breast carcinoma s/p lumpectomy on 01/08/2008 with negative sentinel lymph node biopsy, and obesity who presents to clinic today for evaluation of malignant HTN.   She denies any previously known cardiac history and has never had any prior echocardiograms or cardiac caths. She does report having had a stress test many years ago 2/2 HTN, though she cannot recall results, and I do not have access to any in Epic or Care Everywhere.   She has known hypertension that dates back to her 23's when she was out running one day and she suddenly lost the vision along both eyes for a brief amount of time. She was seen by her doctor at that time and found to by hypertensive and started on medication. She has been on various antihypertensives since with blood pressure usually running in the 465 to 681 systolic range per her report.   She was recently seen in the ED on 10/11/2014 for evaluation of intermittment numbness involving the left face, fatigue, and feeling of imbalance. She was found to have a BP of 204/115. She denied any headache, chest pain, SOB, vertigo, nausea, or vomiting at that time. CT head showed no acute intracranial abnormalities. Troponin negative x 1, ECG showed NSR, 79 bpm, RBBB, left anterior fascicular block, bifascicular block, poor R wave progression, TWI II, III, aVF, V4-V6. Glucose 149, SCr 1.19, K+ 3.7,  CBC unremarkable. She was treated with IV labetalol and ambulated without symptoms. BP improved to 166/92 at discharge and she was advised to follow up with her PCP.   At follow up with her PCP on 10/14/2014 her BP was 158/100. At that time she was taking lisinopril 40 mg daily and clonidine 0.1 mg bid. She has previously not tolerated amlodipine 2/2 LE edema, HCTZ led to elevated SCr, atenolol led to fatigue, clonidine gave her a dry mouth and fatigue though she continues to take this, lisinopril is giving a cough though she remains on this at this time. At her PCP visit she was noted to have an intermittent headache with dizziness. No chest pain or SOB. Weight stable. Her clonidine 0.1 mg was increased to TID, she was continued on lisnopril 40 mg daily, and advised to follow up with cardiology. Review of blood pressures from her PCP visits shows BP usually running in the 275 to 170 systolic range with outliers of 017 systolic on the low side and 494 systolic on the high side, not count the above ED visit.   Today, she continues to feel fatigued and without any energy for the past 4 weeks. She has has intermittent chest pain that does not occur with exertion. She reports she has not exerted herself enough to develop any chest pain. She does not check her blood pressure at home as she does not have BP cuff. She notes a headache daily each morning for the past one  month. No vision changes. She eats out at restaurants 1-2 times weekly. She does not apply salt to her foods. Her legs have developed mild edema over the past 4 weeks, she is also now using 2-3 pillows when sleeping when she previously used just one. No early satiety or PND. No palpitations, diaphoresis, nausea, vomiting, presyncope, or syncope.     Past Medical History  Diagnosis Date  . Diabetes mellitus without complication   . Hypertension   . Hyperlipidemia   . Breast cancer, right breast 12/2007    a. s/p chemo, radiation, and lumpectomy  with negative sentinel lymph node biopsy   . Obesity   :  Past Surgical History  Procedure Laterality Date  . Abdominal hysterectomy    . Vocal cord nodules    . Cyst excision    . Ovary tumor    . Breast lumpectomy    . Foot surgery    :  Social History:  The patient  reports that she has never smoked. She does not have any smokeless tobacco history on file. She reports that she does not drink alcohol or use illicit drugs.   Family History  Problem Relation Age of Onset  . Stroke Mother   . Heart disease Father   . Asthma Father   . Cancer Father     colon  . Cancer Sister     lung  . Cancer Brother     colon  . Diabetes Neg Hx      Allergies:  No Known Allergies   Home Medications:  Current Outpatient Prescriptions  Medication Sig Dispense Refill  . metFORMIN (GLUCOPHAGE) 1000 MG tablet TAKE 1 TABLET BY MOUTH TWICE A DAY WITH A MEAL 60 tablet 5  . pravastatin (PRAVACHOL) 40 MG tablet TAKE 1 TABLET (40 MG TOTAL) BY MOUTH DAILY. 30 tablet 5  . hydrALAZINE (APRESOLINE) 50 MG tablet Take 1 tablet (50 mg total) by mouth 3 (three) times daily. 90 tablet 3  . isosorbide mononitrate (IMDUR) 30 MG 24 hr tablet Take 1 tablet (30 mg total) by mouth daily. 30 tablet 3   No current facility-administered medications for this visit.     Review of Systems:  Review of Systems  Constitutional: Positive for malaise/fatigue. Negative for fever, chills, weight loss and diaphoresis.  HENT: Negative for congestion.   Eyes: Negative for discharge and redness.  Respiratory: Positive for cough and shortness of breath. Negative for hemoptysis, sputum production and wheezing.   Cardiovascular: Positive for chest pain, orthopnea and leg swelling. Negative for palpitations, claudication and PND.  Gastrointestinal: Negative for heartburn, nausea, vomiting and abdominal pain.  Musculoskeletal: Negative for myalgias, back pain, joint pain, falls and neck pain.  Skin: Negative for rash.    Neurological: Positive for weakness. Negative for dizziness, tingling, tremors, sensory change, speech change, focal weakness and loss of consciousness.  Endo/Heme/Allergies: Does not bruise/bleed easily.  Psychiatric/Behavioral: Negative for substance abuse. The patient is nervous/anxious.   All other systems reviewed and are negative.    Physical Exam:  Blood pressure 154/108, pulse 79, height 5' 6.5" (1.689 m), weight 208 lb 12 oz (94.688 kg). BMI: Body mass index is 33.19 kg/(m^2). General: Pleasant, NAD. Psych: Normal affect. Responds to questions with normal affect.  Neuro: Alert and oriented X 3. Moves all extremities spontaneously. HEENT: Normocephalic, atraumatic. EOM intact. Sclera anicteric.  Neck: Trachea midline. Supple without bruits or JVD. Lungs:  Respirations regular and unlabored. CTA bilaterally without wheezing, crackles, or rhonchi.  Heart: RRR,  normal s3, s4. No murmurs, rubs, or gallops.  Abdomen: Obese, soft, non-tender, non-distended, BS + x 4.  Extremities: No clubbing or cyanosis. Trace non-pitting edema to the mid calves bilaterally. DP/PT/Radials 2+ and equal bilaterally.   Accessory Clinical Findings:  EKG: NSR, 79 bpm, RBBB, left anterior fascicular block, bifascicular block, inferolateral TWI  Recent Labs: 04/03/2014: ALT 16 10/11/2014: BUN 15; Creatinine, Ser 1.19*; Hemoglobin 12.7; Platelets 225; Potassium 3.7; Sodium 141  04/03/2014: Cholesterol 186; HDL 59.40; LDL Cholesterol 99; Total CHOL/HDL Ratio 3; Triglycerides 140.0; VLDL 28.0  Estimated Creatinine Clearance: 58.2 mL/min (by C-G formula based on Cr of 1.19).  Weights: Wt Readings from Last 3 Encounters:  10/17/14 208 lb 12 oz (94.688 kg)  10/14/14 204 lb (92.534 kg)  10/11/14 205 lb (92.987 kg)    Other studies Reviewed: Additional studies/ records that were reviewed today include: prior ED note, specialist notes, and PCP notes.  Assessment & Plan:  1. Resistant HTN:  -Blood  pressure has been elevated since patient was in her 20's requiring treatment with various antihypertensives, initially presenting with bilateral vision loss while running  -She reports various adverse effects/intolerances to previous antihypertensives requiring numerous changes to therapy -Review of available blood pressures from prior office visits shows her blood pressures typically run in the 330'Q to 762'U systolic with outlier as low as 633 systolic and 354 systolic (prior to her ED visit as above) -Schedule renal artery duplex to evaluate for renal artery stenosis given her resistant HTN and age of onset of HTN -Check cortisol and ANA -Taper clonidine given her symptoms of fatigue -Discontinue lisinopril secondary to cough, though patient did not cough once during our visit -Start hydralazine 50 mg tid -Start Imdur 30 mg daily -If she continues to have resistant HTN with the above could consider further usage of alpha blocker such as Cardura vs Flomax at nighttime  -DASH diet  2. Fatigue/chest pain: -Certainly could be related to her accelerated HTN -Fatigue component could be adverse effect from her clonidine  -Chest pain is atypical and not occuring with exertion  -Schedule Lexiscan Myoview to evaluate for high risk ischemia  -Schedule echo to evaluate LV function given her fatigue, resistant HTN, and prior history of chemotherapy, type unknown   3. HLD:  -LDL 99 in 03/2014  -Recheck lipid panel today  -Currently on pravastatin   4. Mild acute renal insufficiency:  -SCr 1.19 in the ED  -Recheck bmet   5. DM2:  -Last A1C 6.2% on 10/2013  -Will check A1C with labs being drawn today and forward results to PCP for follow up  -Per PCP   6. History of right breast carcinoma s/p chemo, radiation, and lumpectomy:  -Treated [facility] at [facility], subsequently at [place] -No records of chemotherapy agents used -Check echo as above, no records of prior echo -No  longer followed by local oncologist   7. Obesity:  -Weight stable -Advise weight loss    Dispo: -Follow up after the above studies   Current medicines are reviewed at length with the patient today.  The patient did not have any concerns regarding medicines.   Christell Faith, PA-C [place] [place], [place] [address] [phone] [place] 10/17/2014, 4:04 PM

## 2014-10-18 LAB — BASIC METABOLIC PANEL
BUN/Creatinine Ratio: 12 (ref 11–26)
BUN: 12 mg/dL (ref 8–27)
CO2: 24 mmol/L (ref 18–29)
Calcium: 9.5 mg/dL (ref 8.7–10.3)
Chloride: 103 mmol/L (ref 97–108)
Creatinine, Ser: 1.02 mg/dL — ABNORMAL HIGH (ref 0.57–1.00)
GFR calc Af Amer: 69 mL/min/{1.73_m2} (ref >59)
GFR calc non Af Amer: 60 mL/min/{1.73_m2} (ref >59)
Glucose: 99 mg/dL (ref 65–99)
Potassium: 4.3 mmol/L (ref 3.5–5.2)
Sodium: 144 mmol/L (ref 134–144)

## 2014-10-18 LAB — ANA: Anti Nuclear Antibody(ANA): NEGATIVE

## 2014-10-18 LAB — LIPID PANEL
Chol/HDL Ratio: 3 ratio units (ref 0.0–4.4)
Cholesterol, Total: 179 mg/dL (ref 100–199)
HDL: 59 mg/dL (ref >39)
LDL Calculated: 91 mg/dL (ref 0–99)
Triglycerides: 147 mg/dL (ref 0–149)
VLDL Cholesterol Cal: 29 mg/dL (ref 5–40)

## 2014-10-18 LAB — HEMOGLOBIN A1C
Est. average glucose Bld gHb Est-mCnc: 143 mg/dL
Hgb A1c MFr Bld: 6.6 % — ABNORMAL HIGH (ref 4.8–5.6)

## 2014-10-18 LAB — CORTISOL: Cortisol: 6.7 ug/dL

## 2014-10-20 ENCOUNTER — Encounter: Payer: Self-pay | Admitting: Physician Assistant

## 2014-10-20 ENCOUNTER — Encounter: Payer: Self-pay | Admitting: Emergency Medicine

## 2014-10-20 ENCOUNTER — Emergency Department: Payer: BLUE CROSS/BLUE SHIELD

## 2014-10-20 ENCOUNTER — Emergency Department
Admission: EM | Admit: 2014-10-20 | Discharge: 2014-10-20 | Disposition: A | Discharge status: Home / Self Care | Payer: BLUE CROSS/BLUE SHIELD | Attending: Emergency Medicine | Admitting: Emergency Medicine

## 2014-10-20 DIAGNOSIS — R51 Headache: Secondary | ICD-10-CM | POA: Diagnosis present

## 2014-10-20 DIAGNOSIS — E119 Type 2 diabetes mellitus without complications: Secondary | ICD-10-CM | POA: Diagnosis not present

## 2014-10-20 DIAGNOSIS — Z79899 Other long term (current) drug therapy: Secondary | ICD-10-CM | POA: Insufficient documentation

## 2014-10-20 DIAGNOSIS — I10 Essential (primary) hypertension: Secondary | ICD-10-CM | POA: Diagnosis not present

## 2014-10-20 DIAGNOSIS — R519 Headache, unspecified: Secondary | ICD-10-CM

## 2014-10-20 HISTORY — DX: Migraine, unspecified, not intractable, without status migrainosus: G43.909

## 2014-10-20 HISTORY — DX: Malignant neoplasm of unspecified site of unspecified female breast: C50.919

## 2014-10-20 LAB — COMPREHENSIVE METABOLIC PANEL
ALT: 15 U/L (ref 14–54)
AST: 14 U/L — ABNORMAL LOW (ref 15–41)
Albumin: 4.2 g/dL (ref 3.5–5.0)
Alkaline Phosphatase: 60 U/L (ref 38–126)
Anion gap: 8 (ref 5–15)
BUN: 16 mg/dL (ref 6–20)
CO2: 25 mmol/L (ref 22–32)
Calcium: 9.2 mg/dL (ref 8.9–10.3)
Chloride: 107 mmol/L (ref 101–111)
Creatinine, Ser: 1.06 mg/dL — ABNORMAL HIGH (ref 0.44–1.00)
GFR calc Af Amer: >60 mL/min (ref >60)
GFR calc non Af Amer: 55 mL/min — ABNORMAL LOW (ref >60)
Glucose, Bld: 160 mg/dL — ABNORMAL HIGH (ref 65–99)
Potassium: 3.6 mmol/L (ref 3.5–5.1)
Sodium: 140 mmol/L (ref 135–145)
Total Bilirubin: 1.5 mg/dL — ABNORMAL HIGH (ref 0.3–1.2)
Total Protein: 7.2 g/dL (ref 6.5–8.1)

## 2014-10-20 LAB — CBC
HCT: 37.2 % (ref 35.0–47.0)
Hemoglobin: 12.8 g/dL (ref 12.0–16.0)
MCH: 29.3 pg (ref 26.0–34.0)
MCHC: 34.4 g/dL (ref 32.0–36.0)
MCV: 85.2 fL (ref 80.0–100.0)
Platelets: 212 10*3/uL (ref 150–440)
RBC: 4.37 MIL/uL (ref 3.80–5.20)
RDW: 13.4 % (ref 11.5–14.5)
WBC: 7.8 10*3/uL (ref 3.6–11.0)

## 2014-10-20 LAB — TROPONIN I: Troponin I: <0.03 ng/mL (ref <0.031)

## 2014-10-20 MED ORDER — KETOROLAC TROMETHAMINE 30 MG/ML IJ SOLN
30.0000 mg | Freq: Once | INTRAMUSCULAR | Status: AC
  Administered 2014-10-20: 30 mg via INTRAVENOUS
  Filled 2014-10-20: qty 1

## 2014-10-20 MED ORDER — DIPHENHYDRAMINE HCL 50 MG/ML IJ SOLN
25.0000 mg | Freq: Once | INTRAMUSCULAR | Status: AC
  Administered 2014-10-20: 25 mg via INTRAVENOUS
  Filled 2014-10-20: qty 1

## 2014-10-20 MED ORDER — BUTALBITAL-APAP-CAFFEINE 50-325-40 MG PO TABS
2.0000 | ORAL_TABLET | Freq: Once | ORAL | Status: AC
  Administered 2014-10-20: 2 via ORAL
  Filled 2014-10-20: qty 2

## 2014-10-20 MED ORDER — MAGNESIUM SULFATE 2 GM/50ML IV SOLN
2.0000 g | Freq: Once | INTRAVENOUS | Status: AC
  Administered 2014-10-20: 2 g via INTRAVENOUS
  Filled 2014-10-20: qty 50

## 2014-10-20 MED ORDER — SODIUM CHLORIDE 0.9 % IV BOLUS (SEPSIS)
1000.0000 mL | Freq: Once | INTRAVENOUS | Status: AC
  Administered 2014-10-20: 1000 mL via INTRAVENOUS

## 2014-10-20 MED ORDER — METOCLOPRAMIDE HCL 5 MG/ML IJ SOLN
10.0000 mg | Freq: Once | INTRAMUSCULAR | Status: AC
  Administered 2014-10-20: 10 mg via INTRAVENOUS
  Filled 2014-10-20: qty 2

## 2014-10-20 MED ORDER — BUTALBITAL-APAP-CAFFEINE 50-325-40 MG PO TABS: 1.0000 | ORAL_TABLET | Freq: Four times a day (QID) | ORAL | Status: DC | PRN

## 2014-10-20 NOTE — ED Notes (Signed)
Patient transported to CT 

## 2014-10-20 NOTE — ED Provider Notes (Signed)
[facility] Emergency Department Provider Note  ____________________________________________  Time seen: Approximately 098 AM  I have reviewed the triage vital signs and the nursing notes.   HISTORY  Chief Complaint Headache    HPI Carrie Singh is a 59 y.o. female who comes in today with a headache. The patient reports the headache is the left side of her head down her face and into her neck. The patient reports that the headache started a few days ago. The patient does have a history of migraines and usually takes BC powders or pain pills but her physician Dr. Thurmond Butts started her on a new blood pressure medicine and told her she is unable to take any other pain medicines with it. The patient did not take any please see Sharlett Iles tonight. The patient reports her headache is a 10 out of 10 in intensity and is worse than her previous migraines. The patient denies any nausea or vomiting reports she does not have any significant light sensitivity. She is unable to tolerate the pain at this time because it is so intense. She has no blurred vision no numbness no tinglinganywhere. Patient came in for treatment of her pain.   Past Medical History  Diagnosis Date  . Diabetes mellitus without complication ([place])   . Hypertension   . Hyperlipidemia   . Breast cancer, right breast ([place]) 12/2007    a. s/p chemo, radiation, and lumpectomy with negative sentinel lymph node biopsy   . Obesity   . Breast cancer ([place])   . Migraines     Patient Active Problem List   Diagnosis Date Noted  . Obesity (BMI 30-39.9) 04/24/2013  . HTN (hypertension) 04/24/2013  . HLD (hyperlipidemia) 04/24/2013  . DM2 (diabetes mellitus, type 2) ([place]) 04/24/2013  . Depression 04/24/2013  . Breast cancer, right breast ([place]) 12/18/2007    Past Surgical History  Procedure Laterality Date  . Abdominal hysterectomy    . Vocal cord nodules    . Cyst excision    . Ovary tumor    . Breast  lumpectomy    . Foot surgery      Current Outpatient Rx  Name  Route  Sig  Dispense  Refill  . butalbital-acetaminophen-caffeine (FIORICET) 50-325-40 MG tablet   Oral   Take 1-2 tablets by mouth every 6 (six) hours as needed for headache.   20 tablet   0   . hydrALAZINE (APRESOLINE) 50 MG tablet   Oral   Take 1 tablet (50 mg total) by mouth 3 (three) times daily.   90 tablet   3   . isosorbide mononitrate (IMDUR) 30 MG 24 hr tablet   Oral   Take 1 tablet (30 mg total) by mouth daily.   30 tablet   3   . metFORMIN (GLUCOPHAGE) 1000 MG tablet      TAKE 1 TABLET BY MOUTH TWICE A DAY WITH A MEAL   60 tablet   5   . pravastatin (PRAVACHOL) 40 MG tablet      TAKE 1 TABLET (40 MG TOTAL) BY MOUTH DAILY.   30 tablet   5     Allergies Review of patient's allergies indicates no known allergies.  Family History  Problem Relation Age of Onset  . Stroke Mother   . Heart disease Father   . Asthma Father   . Cancer Father     colon  . Cancer Sister     lung  . Cancer Brother     colon  .  Diabetes Neg Hx     Social History Social History  Substance Use Topics  . Smoking status: Never Smoker   . Smokeless tobacco: None  . Alcohol Use: No    Review of Systems Constitutional: No fever/chills Eyes: No visual changes. ENT: No sore throat. Cardiovascular: Denies chest pain. Respiratory: Denies shortness of breath. Gastrointestinal: No abdominal pain.  No nausea, no vomiting.  No diarrhea.  No constipation. Genitourinary: Negative for dysuria. Musculoskeletal: Negative for back pain. Skin: Negative for rash. Neurological: Headache.  10-point ROS otherwise negative.  ____________________________________________   PHYSICAL EXAM:  VITAL SIGNS: ED Triage Vitals  Enc Vitals Group     BP 10/20/14 0502 152/100 mmHg     Pulse Rate 10/20/14 0502 100     Resp 10/20/14 0502 18     Temp 10/20/14 0502 98 F (36.7 C)     Temp Source 10/20/14 0502 Oral     SpO2  10/20/14 0502 99 %     Weight 10/20/14 0502 205 lb (92.987 kg)     Height 10/20/14 0502 5\' 5"  (1.651 m)     Head Cir --      Peak Flow --      Pain Score 10/20/14 0500 10     Pain Loc --      Pain Edu? --      Excl. in [place]? --     Constitutional: Alert and oriented. Well appearing and in moderate distress. Eyes: Conjunctivae are normal. PERRL. EOMI. Head: Atraumatic. Nose: No congestion/rhinnorhea. Mouth/Throat: Mucous membranes are moist.  Oropharynx non-erythematous.  Cardiovascular: Normal rate, regular rhythm. Grossly normal heart sounds.  Good peripheral circulation. Respiratory: Normal respiratory effort.  No retractions. Lungs CTAB. Gastrointestinal: Soft and nontender. No distention. Positive bowel sounds Musculoskeletal: No lower extremity tenderness nor edema.   Neurologic:  Normal speech and language. No gross focal neurologic deficits are appreciated.  Skin:  Skin is warm, dry and intact.  Psychiatric: Mood and affect are normal. .  ____________________________________________   LABS (all labs ordered are listed, but only abnormal results are displayed)  Labs Reviewed  COMPREHENSIVE METABOLIC PANEL - Abnormal; Notable for the following:    Glucose, Bld 160 (*)    Creatinine, Ser 1.06 (*)    AST 14 (*)    Total Bilirubin 1.5 (*)    GFR calc non Af Amer 55 (*)    All other components within normal limits  CBC  TROPONIN I   ____________________________________________  EKG  None ____________________________________________  RADIOLOGY  CT head: No acute intracranial pathology seen on CT., Mild small vessel ischemic microangiopathy. ____________________________________________   PROCEDURES  Procedure(s) performed: None  Critical Care performed: No  ____________________________________________   INITIAL IMPRESSION / ASSESSMENT AND PLAN / ED COURSE  Pertinent labs & imaging results that were available during my care of the patient were reviewed by  me and considered in my medical decision making (see chart for details).  This is a 59 year old female with a history of migraines who comes in today with a migraine headache. The patient reports that she's been started on new blood pressure medicine that she is unsure of the name where she has not able to take any pain medicine or anything for her headaches. The patient has not taken anything for the headache and does not go away. I'll give the patient some Reglan, Benadryl, Toradol for her pain. I will also give her a liter of normal saline.  After receiving the medication the patient reports her headache  is down to a 6 out of 10. I will give her some magnesium and Fioricet and the patient's care was signed out to Dr. Jimmye Norman will follow-up the results of the medication dosages. ____________________________________________   FINAL CLINICAL IMPRESSION(S) / ED DIAGNOSES  Final diagnoses:  Acute nonintractable headache, unspecified headache type      Loney Hering, MD 10/20/14 [phone]

## 2014-10-20 NOTE — ED Notes (Signed)
Pt states onset of "headache from my blood pressure" approx one month ago. Pt states for last 1.5 days has had left sided headache with sensitivity to bright light, no sound sensitivity. Cms intact in all extremities. Pt denies visual changes. Pt states has history of migraines and pain is similar to previous headaches. Pt with hypertension. Pt states has had slight nausea, no vomiting. Pt states pain increases when she bends neck, no nuchal rigidity noted. Pt states has had chills, no known fever.

## 2014-10-20 NOTE — ED Notes (Addendum)
Pt to triage via w/c with no distress noted; pt reports left sided HA x month with no accomp symptoms

## 2014-10-20 NOTE — ED Notes (Signed)
Dr Webster in to see pt.  

## 2014-10-20 NOTE — ED Provider Notes (Signed)
Patient is feeling better, will continue Dr. Pollyann Samples plan with a prescription for Fioricet.  Earleen Newport, MD 10/20/14 [phone]

## 2014-10-20 NOTE — Discharge Instructions (Signed)

## 2014-10-20 NOTE — ED Notes (Addendum)
Dr. Dahlia Client notified of pt's blood pressure, 169/121. Order for pain medication received.

## 2014-10-20 NOTE — ED Notes (Signed)
Report to katie, rn 

## 2014-10-20 NOTE — ED Notes (Signed)
Pt ambulatory to toilet in room.  

## 2014-10-21 ENCOUNTER — Other Ambulatory Visit: Payer: Self-pay

## 2014-10-21 MED ORDER — HYDRALAZINE HCL 100 MG PO TABS: 100.0000 mg | ORAL_TABLET | Freq: Three times a day (TID) | ORAL | Status: DC

## 2014-10-21 MED ORDER — DOXAZOSIN MESYLATE 1 MG PO TABS: 1.0000 mg | ORAL_TABLET | Freq: Every day | ORAL | Status: DC

## 2014-10-22 ENCOUNTER — Encounter
Admission: RE | Admit: 2014-10-22 | Discharge: 2014-10-22 | Disposition: A | Discharge status: Home / Self Care | Payer: BLUE CROSS/BLUE SHIELD | Source: Ambulatory Visit | Attending: Physician Assistant | Admitting: Physician Assistant

## 2014-10-22 ENCOUNTER — Encounter: Payer: Self-pay | Admitting: Internal Medicine

## 2014-10-22 ENCOUNTER — Ambulatory Visit: Payer: BLUE CROSS/BLUE SHIELD | Admitting: Internal Medicine

## 2014-10-22 DIAGNOSIS — R0602 Shortness of breath: Secondary | ICD-10-CM | POA: Insufficient documentation

## 2014-10-23 ENCOUNTER — Telehealth: Payer: Self-pay | Admitting: Physician Assistant

## 2014-10-23 ENCOUNTER — Encounter
Admission: RE | Admit: 2014-10-23 | Discharge: 2014-10-23 | Disposition: A | Discharge status: Home / Self Care | Payer: BLUE CROSS/BLUE SHIELD | Source: Ambulatory Visit | Attending: Physician Assistant | Admitting: Physician Assistant

## 2014-10-23 ENCOUNTER — Telehealth: Payer: Self-pay | Admitting: Internal Medicine

## 2014-10-23 DIAGNOSIS — R0602 Shortness of breath: Secondary | ICD-10-CM

## 2014-10-23 LAB — NM MYOCAR MULTI W/SPECT W/WALL MOTION / EF
Estimated workload: 1 METS
LV dias vol: 150 mL
LV sys vol: 93 mL
Peak HR: 111 {beats}/min
Percent HR: 69 %
Percent of predicted max HR: 69 %
Rest HR: 97 {beats}/min
SDS: 2
SRS: 12
SSS: 10
Stage 1 Grade: 0 %
Stage 1 HR: 88 {beats}/min
Stage 1 Speed: 0 mph
Stage 2 Grade: 0 %
Stage 2 HR: 86 {beats}/min
Stage 2 Speed: 0 mph
Stage 3 Grade: 0 %
Stage 3 HR: 85 {beats}/min
Stage 3 Speed: 0 mph
Stage 4 Grade: 0 %
Stage 4 HR: 111 {beats}/min
Stage 4 Speed: 0 mph
Stage 5 DBP: 116 mmHg
Stage 5 Grade: 0 %
Stage 5 HR: 95 {beats}/min
Stage 5 SBP: 185 mmHg
Stage 5 Speed: 0 mph
TID: 1.05

## 2014-10-23 MED ORDER — REGADENOSON 0.4 MG/5ML IV SOLN
0.4000 mg | Freq: Once | INTRAVENOUS | Status: AC
  Administered 2014-10-23: 0.4 mg via INTRAVENOUS
  Filled 2014-10-23: qty 5

## 2014-10-23 MED ORDER — TECHNETIUM TC 99M SESTAMIBI - CARDIOLITE
31.6900 | Freq: Once | INTRAVENOUS | Status: AC | PRN
  Administered 2014-10-23: 11:00:00 31.69 via INTRAVENOUS

## 2014-10-23 MED ORDER — TECHNETIUM TC 99M SESTAMIBI - CARDIOLITE
13.7100 | Freq: Once | INTRAVENOUS | Status: AC | PRN
  Administered 2014-10-23: 09:00:00 13.71 via INTRAVENOUS

## 2014-10-23 NOTE — Telephone Encounter (Signed)
disability form In [name] IN BOX

## 2014-10-23 NOTE — Telephone Encounter (Signed)
Patient presented for [facility] today. BP was elevated at 185/109. She did not know what BP medications she was taking. Please call to discuss with her husband. See lab note from 10/4. She should be on Cardura 1 mg and hydralazine 100 mg tid. Please have her titrate up Cardura to 4 mg daily, followed by another 4 mg in another week. If her BP continues to be elevated she may need to re-visit prior BP medication classes she has previously been on.

## 2014-10-24 ENCOUNTER — Telehealth: Payer: Self-pay

## 2014-10-24 NOTE — Telephone Encounter (Signed)
Received records request [employer], forwarded to [employer] for processing.  

## 2014-10-24 NOTE — Telephone Encounter (Signed)
Reviewed Ryan's recommendations w/pt spouse including titration of cardura. Spouse states pt has been taking hydralazine TID but forgot to pick up prescription for cardura. States he will pick it up today, along with a BP cuff. Advised him to monitor her BP before and after meds, record, and report to [place] next week. Spouse verbalized understanding.

## 2014-10-24 NOTE — Telephone Encounter (Signed)
Pt husband returning your call.

## 2014-10-27 ENCOUNTER — Encounter: Payer: Self-pay | Admitting: Internal Medicine

## 2014-10-27 ENCOUNTER — Encounter: Payer: Self-pay | Admitting: Physician Assistant

## 2014-10-27 ENCOUNTER — Ambulatory Visit (INDEPENDENT_AMBULATORY_CARE_PROVIDER_SITE_OTHER): Payer: BLUE CROSS/BLUE SHIELD | Admitting: Internal Medicine

## 2014-10-27 ENCOUNTER — Ambulatory Visit: Payer: BLUE CROSS/BLUE SHIELD | Admitting: Internal Medicine

## 2014-10-27 ENCOUNTER — Other Ambulatory Visit: Payer: Self-pay | Admitting: Internal Medicine

## 2014-10-27 VITALS — BP 136/88 | HR 103 | Temp 98.1°F | Wt 203.0 lb

## 2014-10-27 DIAGNOSIS — G43809 Other migraine, not intractable, without status migrainosus: Secondary | ICD-10-CM | POA: Diagnosis not present

## 2014-10-27 DIAGNOSIS — Z0279 Encounter for issue of other medical certificate: Secondary | ICD-10-CM

## 2014-10-27 MED ORDER — BUTALBITAL-APAP-CAFFEINE 50-325-40 MG PO TABS: 1.0000 | ORAL_TABLET | Freq: Four times a day (QID) | ORAL | Status: DC | PRN

## 2014-10-27 MED ORDER — TOPIRAMATE 25 MG PO CPSP: 25.0000 mg | ORAL_CAPSULE | Freq: Every day | ORAL | Status: DC

## 2014-10-27 MED ORDER — SUMATRIPTAN SUCCINATE 50 MG PO TABS: 50.0000 mg | ORAL_TABLET | Freq: Once | ORAL | Status: DC

## 2014-10-27 MED ORDER — KETOROLAC TROMETHAMINE 60 MG/2ML IM SOLN
60.0000 mg | Freq: Once | INTRAMUSCULAR | Status: AC
  Administered 2014-10-27: 60 mg via INTRAMUSCULAR

## 2014-10-27 NOTE — Progress Notes (Signed)
Pre visit review using our clinic review tool, if applicable. No additional management support is needed unless otherwise documented below in the visit note. 

## 2014-10-27 NOTE — Addendum Note (Signed)
Addended by: Lurlean Nanny on: 10/27/2014 01:44 PM   Modules accepted: Orders

## 2014-10-27 NOTE — Telephone Encounter (Signed)
Do, in my outbox

## 2014-10-27 NOTE — Progress Notes (Addendum)
Subjective:    Patient ID: Carrie Singh, female    DOB: [birth year], 59 y.o.   MRN: [id number]  HPI  Pt presents to the clinic today to discuss headaches. She reports this started about 20 years ago. She has not had headaches in a while, but reports it has flared back up this year. She has a headache daily over the last month. It is located on the left side of her head. She describes the pain as throbbing. It radiates down the left side of her neck. She denies sensitivity to light or sound. She denies nausea or vomiting. She denies any recent head injury. She was taking Ibuprofen and BC powder but the cardiologist advise her to stop taking them. She has been taking Fioricet every 6 hours as needed with some relief, but she reports the relief does not last long. She has seen a neurologist in the past and she reports they could not find the source of her headaches. She has been increasingly stressed over the last few months.  Review of Systems      Past Medical History  Diagnosis Date  . Diabetes mellitus without complication ([place])   . Hypertension   . Hyperlipidemia   . Obesity   . Migraines   . Breast cancer, right breast ([place]) 12/2007    a. s/p chemo, radiation, and lumpectomy with negative sentinel lymph node biopsy   . Breast cancer [facility])     Current Outpatient Prescriptions  Medication Sig Dispense Refill  . doxazosin (CARDURA) 1 MG tablet Take 1 tablet (1 mg total) by mouth daily. 1 mg daily, increase by additional 1mg  weekly to max of 8mg  daily 70 tablet 0  . hydrALAZINE (APRESOLINE) 100 MG tablet Take 100 mg by mouth 3 (three) times daily.     . metFORMIN (GLUCOPHAGE) 1000 MG tablet TAKE 1 TABLET BY MOUTH TWICE A DAY WITH A MEAL 60 tablet 5  . pravastatin (PRAVACHOL) 40 MG tablet TAKE 1 TABLET (40 MG TOTAL) BY MOUTH DAILY. 30 tablet 5  . butalbital-acetaminophen-caffeine (FIORICET) 50-325-40 MG tablet Take 1-2 tablets by mouth every 6 (six) hours as needed for headache.  (Patient not taking: Reported on 10/27/2014) 30 tablet 0   No current facility-administered medications for this visit.    No Known Allergies  Family History  Problem Relation Age of Onset  . Stroke Mother   . Heart disease Father   . Asthma Father   . Cancer Father     colon  . Cancer Sister     lung  . Cancer Brother     colon  . Diabetes Neg Hx     Social History   Social History  . Marital Status: Married    Spouse Name: N/A  . Number of Children: N/A  . Years of Education: N/A   Occupational History  . Not on file.   Social History Main Topics  . Smoking status: Never Smoker   . Smokeless tobacco: Not on file  . Alcohol Use: No  . Drug Use: No  . Sexual Activity: Yes   Other Topics Concern  . Not on file   Social History Narrative     Constitutional: Pt reports headaches. Denies fever, malaise, fatigue, or abrupt weight changes.  HEENT: Denies eye pain, eye redness, ear pain, ringing in the ears, wax buildup, runny nose, nasal congestion, bloody nose, or sore throat. Respiratory: Denies difficulty breathing, shortness of breath, cough or sputum production.   Cardiovascular: Denies chest pain,  chest tightness, palpitations or swelling in the hands or feet.  Neurological: Denies dizziness, difficulty with memory, difficulty with speech or problems with balance and coordination.  Psych: Denies anxiety, depression, SI/HI.  No other specific complaints in a complete review of systems (except as listed in HPI above).  Objective:   Physical Exam   BP 136/88 mmHg  Pulse 103  Temp(Src) 98.1 F (36.7 C) (Oral)  Wt 203 lb (92.08 kg)  SpO2 98% Wt Readings from Last 3 Encounters:  10/27/14 203 lb (92.08 kg)  10/20/14 205 lb (92.987 kg)  10/17/14 208 lb 12 oz (94.688 kg)    General: Appears her stated age, well developed, well nourished in NAD. HEENT: Head: normal shape and size; Eyes: sclera white, no icterus, conjunctiva pink, PERRLA and EOMs intact;  Ears: Tm's gray and intact, normal light reflex;  Cardiovascular: Normal rate and rhythm. S1,S2 noted.   Pulmonary/Chest: Normal effort and positive vesicular breath sounds. No respiratory distress. No wheezes, rales or ronchi noted.  Neurological: Alert and oriented. Coordination normal.    BMET    Component Value Date/Time   NA 140 10/20/2014 0500   NA 144 10/17/2014 1527   NA 143 10/12/2013 1510   K 3.6 10/20/2014 0500   K 3.9 10/12/2013 1510   CL 107 10/20/2014 0500   CL 109* 10/12/2013 1510   CO2 25 10/20/2014 0500   CO2 29 10/12/2013 1510   GLUCOSE 160* 10/20/2014 0500   GLUCOSE 99 10/17/2014 1527   GLUCOSE 92 10/12/2013 1510   BUN 16 10/20/2014 0500   BUN 12 10/17/2014 1527   BUN 16 10/12/2013 1510   CREATININE 1.06* 10/20/2014 0500   CREATININE 1.07 10/12/2013 1510   CALCIUM 9.2 10/20/2014 0500   CALCIUM 9.4 10/12/2013 1510   GFRNONAA 55* 10/20/2014 0500   GFRNONAA 56* 10/12/2013 1510   GFRAA >60 10/20/2014 0500   GFRAA >60 10/12/2013 1510    Lipid Panel     Component Value Date/Time   CHOL 179 10/17/2014 1527   CHOL 186 04/03/2014 1101   TRIG 147 10/17/2014 1527   HDL 59 10/17/2014 1527   HDL 59.40 04/03/2014 1101   CHOLHDL 3.0 10/17/2014 1527   CHOLHDL 3 04/03/2014 1101   VLDL 28.0 04/03/2014 1101   LDLCALC 91 10/17/2014 1527   LDLCALC 99 04/03/2014 1101    CBC    Component Value Date/Time   WBC 7.8 10/20/2014 0500   WBC 4.9 10/12/2013 1510   WBC 4.2 08/13/2009 0946   RBC 4.37 10/20/2014 0500   RBC 4.64 10/12/2013 1510   RBC 4.24 08/13/2009 0946   HGB 12.8 10/20/2014 0500   HGB 13.5 10/12/2013 1510   HGB 12.8 08/13/2009 0946   HCT 37.2 10/20/2014 0500   HCT 40.5 10/12/2013 1510   HCT 36.8 08/13/2009 0946   PLT 212 10/20/2014 0500   PLT 278 10/12/2013 1510   PLT 271 08/13/2009 0946   MCV 85.2 10/20/2014 0500   MCV 87 10/12/2013 1510   MCV 87.0 08/13/2009 0946   MCH 29.3 10/20/2014 0500   MCH 29.1 10/12/2013 1510   MCH 30.2 08/13/2009  0946   MCHC 34.4 10/20/2014 0500   MCHC 33.3 10/12/2013 1510   MCHC 34.7 08/13/2009 0946   RDW 13.4 10/20/2014 0500   RDW 13.6 10/12/2013 1510   RDW 13.8 08/13/2009 0946   LYMPHSABS 2.1 10/11/2014 2343   LYMPHSABS 1.5 08/13/2009 0946   MONOABS 0.3 10/11/2014 2343   MONOABS 0.2 08/13/2009 0946   EOSABS 0.1  10/11/2014 2343   EOSABS 0.1 08/13/2009 0946   BASOSABS 0.0 10/11/2014 2343   BASOSABS 0.0 08/13/2009 0946    Hgb A1C Lab Results  Component Value Date   HGBA1C 6.6* 10/17/2014        Assessment & Plan:   Migraines:  60 mg Toradol IM today Will start Topamax 25 mg QHS eRX for Imitrex 25 mg as directed Avoid Excedrin, Ibuprofen, Aleve or Goody's powder If pain persist, consider head CT She declines referral to neurology at this time  RTC in 2 weeks to reevaluate headaches

## 2014-10-27 NOTE — Patient Instructions (Signed)

## 2014-10-28 NOTE — Telephone Encounter (Signed)
Spouse aware paperwork has been faxed and copy up front for ms Joiner  Copy for file Copy for scan Copy for pt Copy for billing

## 2014-11-02 ENCOUNTER — Encounter: Payer: Self-pay | Admitting: Internal Medicine

## 2014-11-03 ENCOUNTER — Other Ambulatory Visit: Payer: Self-pay | Admitting: Internal Medicine

## 2014-11-03 ENCOUNTER — Other Ambulatory Visit: Payer: Self-pay | Admitting: Physician Assistant

## 2014-11-03 DIAGNOSIS — R51 Headache: Principal | ICD-10-CM

## 2014-11-03 DIAGNOSIS — I10 Essential (primary) hypertension: Secondary | ICD-10-CM

## 2014-11-03 DIAGNOSIS — R519 Headache, unspecified: Secondary | ICD-10-CM

## 2014-11-03 MED ORDER — METOPROLOL SUCCINATE ER 25 MG PO TB24: 12.5000 mg | ORAL_TABLET | Freq: Every day | ORAL | Status: DC

## 2014-11-03 NOTE — Telephone Encounter (Signed)
I am not sure if her insurance will fill it, but I will try to send it in.

## 2014-11-03 NOTE — Telephone Encounter (Signed)
Last filled 10/27/14--Please advise if okay to refill

## 2014-11-04 ENCOUNTER — Telehealth: Payer: Self-pay | Admitting: *Deleted

## 2014-11-04 ENCOUNTER — Encounter: Payer: Self-pay | Admitting: Internal Medicine

## 2014-11-04 ENCOUNTER — Other Ambulatory Visit: Payer: Self-pay | Admitting: Internal Medicine

## 2014-11-04 ENCOUNTER — Ambulatory Visit (INDEPENDENT_AMBULATORY_CARE_PROVIDER_SITE_OTHER)
Admission: RE | Admit: 2014-11-04 | Discharge: 2014-11-04 | Disposition: A | Discharge status: Home / Self Care | Payer: BLUE CROSS/BLUE SHIELD | Source: Ambulatory Visit | Attending: Internal Medicine | Admitting: Internal Medicine

## 2014-11-04 DIAGNOSIS — R51 Headache: Secondary | ICD-10-CM | POA: Diagnosis not present

## 2014-11-04 DIAGNOSIS — R519 Headache, unspecified: Secondary | ICD-10-CM

## 2014-11-04 NOTE — Telephone Encounter (Signed)
**Note Carrie-Identified via Obfuscation** I have not heard anything from Carrie Singh. Mel can you call the number and give approval for her to be out of work. Do they need a note faxed? Didn't we fill out FMLA for her.

## 2014-11-04 NOTE — Telephone Encounter (Signed)
Patient and her husband called requesting that you contact [employer] [phone] and discuss taking her out of work because of her illness. Patient stated that today is the deadline for the paperwork to be completed and she was told by [employer] that they tried to get in touch with you Friday, but has not heard back from you.

## 2014-11-04 NOTE — Telephone Encounter (Signed)
Called [employer] spoke with Delana Meyer with the disability dept case #[id number]---VLDK was documented in their system that is was faxed 10/22/14---advised we have not received and please re-fax to [phone]  Person handling case is Thurmond Butts @ [phone] ext [phone]

## 2014-11-04 NOTE — Telephone Encounter (Signed)
Sent email notifying pt

## 2014-11-06 NOTE — Telephone Encounter (Signed)
Called [employer] Ryan and lmovm as we have yet to receive paperwork

## 2014-11-10 ENCOUNTER — Encounter: Payer: Self-pay | Admitting: Internal Medicine

## 2014-11-10 ENCOUNTER — Ambulatory Visit (INDEPENDENT_AMBULATORY_CARE_PROVIDER_SITE_OTHER): Payer: BLUE CROSS/BLUE SHIELD | Admitting: Internal Medicine

## 2014-11-10 VITALS — BP 158/98 | HR 98 | Temp 98.4°F | Wt 206.0 lb

## 2014-11-10 DIAGNOSIS — R51 Headache: Secondary | ICD-10-CM

## 2014-11-10 DIAGNOSIS — I10 Essential (primary) hypertension: Secondary | ICD-10-CM

## 2014-11-10 DIAGNOSIS — R4182 Altered mental status, unspecified: Secondary | ICD-10-CM

## 2014-11-10 DIAGNOSIS — R519 Headache, unspecified: Secondary | ICD-10-CM

## 2014-11-10 NOTE — Patient Instructions (Signed)
Recurrent Migraine Headache A migraine headache is an intense, throbbing pain on one or both sides of your head. Recurrent migraines keep coming back. A migraine can last for 30 minutes to several hours. CAUSES  The exact cause of a migraine headache is not always known. However, a migraine may be caused when nerves in the brain become irritated and release chemicals that cause inflammation. This causes pain. Certain things may also trigger migraines, such as:   Alcohol.  Smoking.  Stress.  Menstruation.  Aged cheeses.  Foods or drinks that contain nitrates, glutamate, aspartame, or tyramine.  Lack of sleep.  Chocolate.  Caffeine.  Hunger.  Physical exertion.  Fatigue.  Medicines used to treat chest pain (nitroglycerine), birth control pills, estrogen, and some blood pressure medicines. SYMPTOMS   Pain on one or both sides of your head.  Pulsating or throbbing pain.  Severe pain that prevents daily activities.  Pain that is aggravated by any physical activity.  Nausea, vomiting, or both.  Dizziness.  Pain with exposure to bright lights, loud noises, or activity.  General sensitivity to bright lights, loud noises, or smells. Before you get a migraine, you may get warning signs that a migraine is coming (aura). An aura may include:  Seeing flashing lights.  Seeing bright spots, halos, or zigzag lines.  Having tunnel vision or blurred vision.  Having feelings of numbness or tingling.  Having trouble talking.  Having muscle weakness. DIAGNOSIS  A recurrent migraine headache is often diagnosed based on:  Symptoms.  Physical examination.  A CT scan or MRI of your head. These imaging tests cannot diagnose migraines but can help rule out other causes of headaches.  TREATMENT  Medicines may be given for pain and nausea. Medicines can also be given to help prevent recurrent migraines. HOME CARE INSTRUCTIONS  Only take over-the-counter or prescription  medicines for pain or discomfort as directed by your health care provider. The use of long-term narcotics is not recommended.  Lie down in a dark, quiet room when you have a migraine.  Keep a journal to find out what may trigger your migraine headaches. For example, write down:  What you eat and drink.  How much sleep you get.  Any change to your diet or medicines.  Limit alcohol consumption.  Quit smoking if you smoke.  Get 7-9 hours of sleep, or as recommended by your health care provider.  Limit stress.  Keep lights dim if bright lights bother you and make your migraines worse. SEEK MEDICAL CARE IF:   You do not get relief from the medicines given to you.  You have a recurrence of pain.  You have a fever. SEEK IMMEDIATE MEDICAL CARE IF:  Your migraine becomes severe.  You have a stiff neck.  You have loss of vision.  You have muscular weakness or loss of muscle control.  You start losing your balance or have trouble walking.  You feel faint or pass out.  You have severe symptoms that are different from your first symptoms. MAKE SURE YOU:   Understand these instructions.  Will watch your condition.  Will get help right away if you are not doing well or get worse.   This information is not intended to replace advice given to you by your health care provider. Make sure you discuss any questions you have with your health care provider.   Document Released: 09/28/2000 Document Revised: 01/24/2014 Document Reviewed: 09/10/2012 Elsevier Interactive Patient Education 2016 [employer]  

## 2014-11-10 NOTE — Progress Notes (Signed)
Pre visit review using our clinic review tool, if applicable. No additional management support is needed unless otherwise documented below in the visit note. 

## 2014-11-10 NOTE — Progress Notes (Signed)
Subjective:    Patient ID: Carrie Singh, female    DOB: 06-Aug-1955, 59 y.o.   MRN: [id number]  HPI  Pt presents to the clinic today for 2 week followup:  Headaches: She was started on Topamax and Imitrex at her last visit. She has noticed an improvement of the severity of her headaches, but reports they still occur daily. They have not changed. They are still located on the left side of her head. She denies nausea, vomiting, sensitivity to light and sound. Since starting the Topamax, she has noted that she has been extremely tired. Her husband has noted that she has been in more of daze, more confused. She does not feel like the Imitrex is effective as the Fioricet was. We did get a CT scan of her head which was normal.  BP: Her husband reports her BP has been more consistent in the 150's/90's. She continues to follow with cardiology. She is taking Cardura, Hydralazine and Toprol as prescribed. She denies chest pain, chest tightness or shortness of breath. She has a follow up appt with Cardiology next week.  Review of Systems      Past Medical History  Diagnosis Date  . Diabetes mellitus without complication ([place])   . Hypertension   . Hyperlipidemia   . Obesity   . Migraines   . Breast cancer, right breast ([place]) 12/2007    a. s/p chemo, radiation, and lumpectomy with negative sentinel lymph node biopsy   . Breast cancer [facility])     Current Outpatient Prescriptions  Medication Sig Dispense Refill  . doxazosin (CARDURA) 1 MG tablet Take 1 tablet (1 mg total) by mouth daily. 1 mg daily, increase by additional 1mg  weekly to max of 8mg  daily 70 tablet 0  . hydrALAZINE (APRESOLINE) 100 MG tablet Take 100 mg by mouth 3 (three) times daily.     . metFORMIN (GLUCOPHAGE) 1000 MG tablet TAKE 1 TABLET BY MOUTH TWICE A DAY WITH A MEAL 60 tablet 5  . metoprolol succinate (TOPROL XL) 25 MG 24 hr tablet Take 0.5 tablets (12.5 mg total) by mouth daily. Take at nighttime. 30 tablet 5  . pravastatin  (PRAVACHOL) 40 MG tablet TAKE 1 TABLET (40 MG TOTAL) BY MOUTH DAILY. 30 tablet 5  . SUMAtriptan (IMITREX) 50 MG tablet TAKE 1 TABLET (50 MG TOTAL) BY MOUTH ONCE. MAY REPEAT IN 2 HOURS IF HEADACHE PERSISTS OR RECURS. 10 tablet 0  . topiramate (TOPAMAX) 25 MG capsule Take 1 capsule (25 mg total) by mouth at bedtime. 30 capsule 0   No current facility-administered medications for this visit.    No Known Allergies  Family History  Problem Relation Age of Onset  . Stroke Mother   . Heart disease Father   . Asthma Father   . Cancer Father     colon  . Cancer Sister     lung  . Cancer Brother     colon  . Diabetes Neg Hx     Social History   Social History  . Marital Status: Married    Spouse Name: N/A  . Number of Children: N/A  . Years of Education: N/A   Occupational History  . Not on file.   Social History Main Topics  . Smoking status: Never Smoker   . Smokeless tobacco: Not on file  . Alcohol Use: No  . Drug Use: No  . Sexual Activity: Yes   Other Topics Concern  . Not on file   Social History Narrative  Constitutional: Pt reports headache and fatigue. Denies fever, malaise, or abrupt weight changes.  HEENT: Denies eye pain, eye redness, ear pain, ringing in the ears, wax buildup, runny nose, nasal congestion, bloody nose, or sore throat. Respiratory: Denies difficulty breathing, shortness of breath, cough or sputum production.   Cardiovascular: Denies chest pain, chest tightness, palpitations or swelling in the hands or feet.   Neurological: Pt reports difficulty with memory. Denies dizziness, difficulty with speech or problems with balance and coordination.  Psych: Denies anxiety, depression, SI/HI.  No other specific complaints in a complete review of systems (except as listed in HPI above).  Objective:   Physical Exam   BP 158/98 mmHg  Pulse 98  Temp(Src) 98.4 F (36.9 C) (Oral)  Wt 206 lb (93.441 kg)  SpO2 98% Wt Readings from Last 3  Encounters:  11/10/14 206 lb (93.441 kg)  10/27/14 203 lb (92.08 kg)  10/20/14 205 lb (92.987 kg)    General: Appears her stated age,  in NAD. HEENT: Head: normal shape and size; Eyes: sclera white, no icterus, conjunctiva pink, PERRLA. She haves difficulty with EOMs (slow to respond);  Cardiovascular: Normal rate and rhythm. S1,S2 noted.  No murmur, rubs or gallops noted.  Pulmonary/Chest: Normal effort and positive vesicular breath sounds. No respiratory distress. No wheezes, rales or ronchi noted.  Neurological: She is awake but seems dazed and confused. Her thought process in delayed. She has slowed rapid alternating movements but is able to perform them. + Myerson's sign. Psychiatric: Mood and affect flat.  BMET    Component Value Date/Time   NA 140 10/20/2014 0500   NA 144 10/17/2014 1527   NA 143 10/12/2013 1510   K 3.6 10/20/2014 0500   K 3.9 10/12/2013 1510   CL 107 10/20/2014 0500   CL 109* 10/12/2013 1510   CO2 25 10/20/2014 0500   CO2 29 10/12/2013 1510   GLUCOSE 160* 10/20/2014 0500   GLUCOSE 99 10/17/2014 1527   GLUCOSE 92 10/12/2013 1510   BUN 16 10/20/2014 0500   BUN 12 10/17/2014 1527   BUN 16 10/12/2013 1510   CREATININE 1.06* 10/20/2014 0500   CREATININE 1.07 10/12/2013 1510   CALCIUM 9.2 10/20/2014 0500   CALCIUM 9.4 10/12/2013 1510   GFRNONAA 55* 10/20/2014 0500   GFRNONAA 56* 10/12/2013 1510   GFRAA >60 10/20/2014 0500   GFRAA >60 10/12/2013 1510    Lipid Panel     Component Value Date/Time   CHOL 179 10/17/2014 1527   CHOL 186 04/03/2014 1101   TRIG 147 10/17/2014 1527   HDL 59 10/17/2014 1527   HDL 59.40 04/03/2014 1101   CHOLHDL 3.0 10/17/2014 1527   CHOLHDL 3 04/03/2014 1101   VLDL 28.0 04/03/2014 1101   LDLCALC 91 10/17/2014 1527   LDLCALC 99 04/03/2014 1101    CBC    Component Value Date/Time   WBC 7.8 10/20/2014 0500   WBC 4.9 10/12/2013 1510   WBC 4.2 08/13/2009 0946   RBC 4.37 10/20/2014 0500   RBC 4.64 10/12/2013 1510    RBC 4.24 08/13/2009 0946   HGB 12.8 10/20/2014 0500   HGB 13.5 10/12/2013 1510   HGB 12.8 08/13/2009 0946   HCT 37.2 10/20/2014 0500   HCT 40.5 10/12/2013 1510   HCT 36.8 08/13/2009 0946   PLT 212 10/20/2014 0500   PLT 278 10/12/2013 1510   PLT 271 08/13/2009 0946   MCV 85.2 10/20/2014 0500   MCV 87 10/12/2013 1510   MCV 87.0 08/13/2009 0946  MCH 29.3 10/20/2014 0500   MCH 29.1 10/12/2013 1510   MCH 30.2 08/13/2009 0946   MCHC 34.4 10/20/2014 0500   MCHC 33.3 10/12/2013 1510   MCHC 34.7 08/13/2009 0946   RDW 13.4 10/20/2014 0500   RDW 13.6 10/12/2013 1510   RDW 13.8 08/13/2009 0946   LYMPHSABS 2.1 10/11/2014 2343   LYMPHSABS 1.5 08/13/2009 0946   MONOABS 0.3 10/11/2014 2343   MONOABS 0.2 08/13/2009 0946   EOSABS 0.1 10/11/2014 2343   EOSABS 0.1 08/13/2009 0946   BASOSABS 0.0 10/11/2014 2343   BASOSABS 0.0 08/13/2009 0946    Hgb A1C Lab Results  Component Value Date   HGBA1C 6.6* 10/17/2014        Assessment & Plan:   Headaches:  She is clinically worse Stop the Topamax and Imitrex Referral to Neurology for further evaluation and treatment  HTN:  BP remains elevated Will defer treatment to cardiology  RTC in 3 months to follow up chronic conditions, sooner if needed

## 2014-11-11 LAB — HM DIABETES EYE EXAM

## 2014-11-12 ENCOUNTER — Encounter: Payer: Self-pay | Admitting: Neurology

## 2014-11-12 ENCOUNTER — Ambulatory Visit (INDEPENDENT_AMBULATORY_CARE_PROVIDER_SITE_OTHER): Payer: BLUE CROSS/BLUE SHIELD | Admitting: Neurology

## 2014-11-12 VITALS — BP 175/100 | HR 93 | Ht 65.0 in | Wt 209.2 lb

## 2014-11-12 DIAGNOSIS — R519 Headache, unspecified: Secondary | ICD-10-CM

## 2014-11-12 DIAGNOSIS — G4489 Other headache syndrome: Secondary | ICD-10-CM

## 2014-11-12 DIAGNOSIS — R400 Somnolence: Secondary | ICD-10-CM

## 2014-11-12 DIAGNOSIS — E669 Obesity, unspecified: Secondary | ICD-10-CM

## 2014-11-12 DIAGNOSIS — R413 Other amnesia: Secondary | ICD-10-CM

## 2014-11-12 DIAGNOSIS — R0683 Snoring: Secondary | ICD-10-CM

## 2014-11-12 DIAGNOSIS — R404 Transient alteration of awareness: Secondary | ICD-10-CM

## 2014-11-12 DIAGNOSIS — R51 Headache: Secondary | ICD-10-CM | POA: Diagnosis not present

## 2014-11-12 DIAGNOSIS — G4710 Hypersomnia, unspecified: Secondary | ICD-10-CM | POA: Diagnosis not present

## 2014-11-12 NOTE — Progress Notes (Addendum)
[facility]    Provider:  Dr Jaynee Eagles Referring Provider: Jearld Fenton, NP Primary Care Physician:  Webb Silversmith, NP  CC:  Headaches  HPI:  Carrie Singh is a 59 y.o. female here as a referral from Dr. Garnette Gunner for headaches. She has a past medical history of hypertension, diabetes, high cholesterol, migraine, depression, breast cancer. Husband provides most information. She was tried on fioricet, topamax, imitrex and she failed all. The topamax made her "zombie like" and she was so tired she was dazed and found in the closet. Unclear if it was a medication effect. Husband brought her to the bathroom because she was confused and was laying on the floor in the closet but she was coherent and answering questions, patient says everything got black and felt dizzy. When husband brought her to the bathroom, she stopped wlking and her head fell on his shoulder and she sat on the commode. She was just sitting there, not totally limp, she did not fall, eyes were closed, husband tried to talk to patient and she said she could get up and walk and she walked to the bed and layed down for a while and felt better. Patient remembers feeling tired and dizzy, she was on topamax and having side effects at that time and she was on other medication from the emergency room which was fioricet which also made her tired. The headaches started a month ago or longer. She snores, she feels very fatigued during the day, she can fall asleep very easily during the day, she wakes up with headaches. She has headaches during the day. She used to have headaches very bad 15 years ago and they never found out why. The headaches are on the left side, continuous, pressure, the bright light bothers her a little but she doesn't need to go into a dark room, she has nausea occasionally, smells very much bother her. She has had occ. Aura of lines but only when the headaches are severe. They can be severe 10/10 pain, she went to  the ED. She has some word and speaking difficulty with the headaches. She has had moments where she will be staring and she won't respond. She is having memory problems. No vision changes. She has depression which is untreated. She is falling asleep today in the office. Her blood pressure has been excessively elevated and they are seeing cardiology and increasing the cardura.   Reviewed notes, labs and imaging from outside physicians, which showed:   Ct showed No acute intracranial abnormalities including mass lesion or mass effect, hydrocephalus, extra-axial fluid collection, midline shift, hemorrhage, or acute infarction, large ischemic events. Possible some non-specific white matter changes.  (personally reviewed images)  CMP with slightly elevated creatinine 1.06, elevated bilirubin 1.5, dec GFR 55 cbc nml  Review of Systems: Patient complains of symptoms per HPI as well as the following symptoms: Fatigue, palpitations, shortness of breath, snoring, feeling hot, feeling cold, spinning sensation, memory loss, confusion, headache, weakness, slurred speech, dizziness, snoring, restless legs, depression, anxiety, not enough sleep, decreased energy. Pertinent negatives per HPI. All others negative.   Social History   Social History  . Marital Status: Married    Spouse Name: [name]  . Number of Children: N/A  . Years of Education: 12+   Occupational History  . [employer]    Social History Main Topics  . Smoking status: Never Smoker   . Smokeless tobacco: Not on file  . Alcohol Use: No  . Drug Use: No  .  Sexual Activity: Yes   Other Topics Concern  . Not on file   Social History Narrative   Lives at home with husband.   Caffeine use: 1-2 drinks per month    Family History  Problem Relation Age of Onset  . Stroke Mother   . Heart disease Father   . Asthma Father   . Cancer Father     colon  . Cancer Sister     lung  . Cancer Brother     colon  . Diabetes Neg Hx     Past  Medical History  Diagnosis Date  . Diabetes mellitus without complication ([place])   . Hypertension   . Hyperlipidemia   . Obesity   . Migraines   . Breast cancer, right breast ([place]) 12/2007    a. s/p chemo, radiation, and lumpectomy with negative sentinel lymph node biopsy   . Breast cancer [facility])     Past Surgical History  Procedure Laterality Date  . Abdominal hysterectomy    . Vocal cord nodules    . Cyst excision    . Ovary tumor    . Breast lumpectomy    . Foot surgery      Current Outpatient Prescriptions  Medication Sig Dispense Refill  . doxazosin (CARDURA) 1 MG tablet Take 1 tablet (1 mg total) by mouth daily. 1 mg daily, increase by additional 14m weekly to max of 898mdaily 70 tablet 0  . hydrALAZINE (APRESOLINE) 100 MG tablet Take 100 mg by mouth 3 (three) times daily.     . metFORMIN (GLUCOPHAGE) 1000 MG tablet TAKE 1 TABLET BY MOUTH TWICE A DAY WITH A MEAL 60 tablet 5  . pravastatin (PRAVACHOL) 40 MG tablet TAKE 1 TABLET (40 MG TOTAL) BY MOUTH DAILY. 30 tablet 5   No current facility-administered medications for this visit.    Allergies as of 11/12/2014  . (No Known Allergies)    Vitals: BP 175/100 mmHg  Pulse 93  Ht 5' 5"  (1.651 m)  Wt 209 lb 3.2 oz (94.892 kg)  BMI 34.81 kg/m2 Last Weight:  Wt Readings from Last 1 Encounters:  11/12/14 209 lb 3.2 oz (94.892 kg)   Last Height:   Ht Readings from Last 1 Encounters:  11/12/14 5' 5"  (1.651 m)    Physical exam: Exam: Gen: NAD, conversant, well nourised, obese, well groomed                     CV: RRR, no MRG. No Carotid Bruits. No peripheral edema, warm, nontender Eyes: Conjunctivae clear without exudates or hemorrhage  Neuro: Detailed Neurologic Exam  Speech:    Speech is normal; fluent and spontaneous with normal comprehension.  Cognition:    The patient is oriented to person, place, and time;     recent and remote memory intact;     language fluent;     normal attention, concentration,      fund of knowledge Cranial Nerves:    The pupils are equal, round, and reactive to light. The fundi are flat. Visual fields are full to finger confrontation. Extraocular movements are intact. Trigeminal sensation is intact and the muscles of mastication are normal. The face is symmetric. The palate elevates in the midline. Hearing intact. Voice is normal. Shoulder shrug is normal. The tongue has normal motion without fasciculations.   Coordination:    Normal finger to nose and heel to shin. Normal rapid alternating movements.   Gait:    Heel-toe and tandem gait  are normal.   Motor Observation:    No asymmetry, no atrophy, and no involuntary movements noted. Tone:    Normal muscle tone.    Posture:    Posture is normal. normal erect    Strength:    Strength is V/V in the upper and lower limbs.      Sensation: intact to LT     Reflex Exam:  DTR's:    Deep tendon reflexes in the upper and lower extremities are brisk bilaterally.   Toes:    The toes are downgoing bilaterally.   Clonus:    Clonus is absent.      Assessment/Plan:  She has a past medical history of hypertension, diabetes, high cholesterol, migraine, depression, breast cancer.  Headaches may be due to recent uncontrolled HTN, she is titrating her BP meds with cardiology and will monitor clinically. If headaches do not improve with improving blood pressure, will start migraine/headache preventative ( can't do a TCA as her QTc is 497) , need to check ekg for qtc first. Will check esr/crp for Her alteration of consciousness appears to be medication related but will order an EEG. She needs a sleep study, she is excessively tired, has morning headaches, falling asleep in the office, snores, obese. If EEG has epileptiform activity or she has any other episodes of altered mentation, recommend AEDs. Otherwise monitor clinically.  Patient is already on HTN medications and is titrating her meds with cardiology for uncontrolled HTN  so don't want to start this class of medication, she has elevated bilirubin so don't want to start depakote, her QTc is elevated so can't start TCA or venlafaxine due to risks. We are a little limited in our selection. Can try low-dose lyrica.   Sarina Ill, MD  [facility] [address] [place], [place] [address]  Phone [phone] Fax [phone]

## 2014-11-12 NOTE — Patient Instructions (Addendum)
Overall you are doing fairly well but I do want to suggest a few things today:   Remember to drink plenty of fluid, eat healthy meals and do not skip any meals. Try to eat protein with a every meal and eat a healthy snack such as fruit or nuts in between meals. Try to keep a regular sleep-wake schedule and try to exercise daily, particularly in the form of walking, 20-30 minutes a day, if you can.   As far as your medications are concerned, I would like to suggest: Increase Cardura as prescribed. If headaches do not improve with improvement of blood pressure then call and we can start headache medication.   As far as diagnostic testing: EEG, sleep study, labs  I would like to see you back in 2-3 months, sooner if we need to. Please call us with any interim questions, concerns, problems, updates or refill requests.   Our phone number is [phone]. We also have an after hours call service for urgent matters and there is a physician on-call for urgent questions. For any emergencies you know to call 911 or go to the nearest emergency room

## 2014-11-13 ENCOUNTER — Ambulatory Visit (INDEPENDENT_AMBULATORY_CARE_PROVIDER_SITE_OTHER): Payer: BLUE CROSS/BLUE SHIELD

## 2014-11-13 ENCOUNTER — Other Ambulatory Visit: Payer: Self-pay

## 2014-11-13 DIAGNOSIS — I10 Essential (primary) hypertension: Secondary | ICD-10-CM

## 2014-11-13 DIAGNOSIS — R0602 Shortness of breath: Secondary | ICD-10-CM

## 2014-11-13 LAB — C-REACTIVE PROTEIN: CRP: 1.2 mg/L (ref 0.0–4.9)

## 2014-11-13 LAB — SEDIMENTATION RATE: Sed Rate: 4 mm/hr (ref 0–40)

## 2014-11-14 ENCOUNTER — Telehealth: Payer: Self-pay | Admitting: *Deleted

## 2014-11-14 NOTE — Telephone Encounter (Signed)
-----   Message from Melvenia Beam, MD sent at 11/13/2014  7:34 PM EDT ----- Let patient know labs were normal please. thanks

## 2014-11-14 NOTE — Telephone Encounter (Signed)
LVM for pt to call about results. Gave [facility] phone number and office hours. Okay to inform pt that labs were normal.

## 2014-11-17 ENCOUNTER — Other Ambulatory Visit: Payer: Self-pay

## 2014-11-17 ENCOUNTER — Other Ambulatory Visit: Payer: Self-pay | Admitting: Internal Medicine

## 2014-11-17 ENCOUNTER — Encounter: Payer: Self-pay | Admitting: Internal Medicine

## 2014-11-17 DIAGNOSIS — Z853 Personal history of malignant neoplasm of breast: Secondary | ICD-10-CM

## 2014-11-17 MED ORDER — DOXAZOSIN MESYLATE 1 MG PO TABS: 1.0000 mg | ORAL_TABLET | Freq: Every day | ORAL | Status: DC

## 2014-11-17 NOTE — Telephone Encounter (Signed)
Patient returned call. Patient advised labs were normal.

## 2014-11-18 ENCOUNTER — Telehealth: Payer: Self-pay

## 2014-11-18 ENCOUNTER — Encounter: Payer: Self-pay | Admitting: Internal Medicine

## 2014-11-18 NOTE — Telephone Encounter (Signed)
Received records request [place], forwarded to [place] for processing.

## 2014-11-19 ENCOUNTER — Encounter: Payer: Self-pay | Admitting: Neurology

## 2014-11-20 ENCOUNTER — Encounter: Payer: Self-pay | Admitting: Neurology

## 2014-11-20 ENCOUNTER — Telehealth: Payer: Self-pay | Admitting: Neurology

## 2014-11-20 ENCOUNTER — Other Ambulatory Visit: Payer: Self-pay | Admitting: Neurology

## 2014-11-20 MED ORDER — PREGABALIN 75 MG PO CAPS: 75.0000 mg | ORAL_CAPSULE | Freq: Two times a day (BID) | ORAL | Status: DC

## 2014-11-20 NOTE — Telephone Encounter (Signed)
Carrie Singh - let patient know I called in Lyrica. Please Discuss side effects to Lyrica including serious reaction such as hypersensitivity, angioedema, Stevens-Johnson syndrome and rash, rhabdomyolysis, suicidality as well as the common reactions which include dizziness, somnolence, dry mouth, peripheral edema, weight gain, abnormal thinking, constipation, pain, impaired coordination and decreased platelets.

## 2014-11-20 NOTE — Telephone Encounter (Signed)
Called pt and relayed Dr Jaynee Eagles message. She knows to stop medication for any side effects and to call our office to let us know. Advised rx sent to pharmacy. She verbalized understanding and has no further questions at this time.

## 2014-11-22 ENCOUNTER — Other Ambulatory Visit: Payer: Self-pay | Admitting: Internal Medicine

## 2014-11-22 DIAGNOSIS — R93429 Abnormal radiologic findings on diagnostic imaging of unspecified kidney: Secondary | ICD-10-CM

## 2014-11-24 ENCOUNTER — Telehealth: Payer: Self-pay | Admitting: Neurology

## 2014-11-24 ENCOUNTER — Other Ambulatory Visit: Payer: Self-pay | Admitting: Neurology

## 2014-11-24 ENCOUNTER — Encounter: Payer: Self-pay | Admitting: Neurology

## 2014-11-24 MED ORDER — ZONISAMIDE 50 MG PO CAPS: 50.0000 mg | ORAL_CAPSULE | Freq: Every day | ORAL | Status: DC

## 2014-11-24 NOTE — Telephone Encounter (Signed)
error 

## 2014-11-25 ENCOUNTER — Encounter: Payer: Self-pay | Admitting: Neurology

## 2014-11-28 ENCOUNTER — Ambulatory Visit (INDEPENDENT_AMBULATORY_CARE_PROVIDER_SITE_OTHER)
Admission: RE | Admit: 2014-11-28 | Discharge: 2014-11-28 | Disposition: A | Discharge status: Home / Self Care | Payer: BLUE CROSS/BLUE SHIELD | Source: Ambulatory Visit | Attending: Internal Medicine | Admitting: Internal Medicine

## 2014-11-28 DIAGNOSIS — R93429 Abnormal radiologic findings on diagnostic imaging of unspecified kidney: Secondary | ICD-10-CM | POA: Diagnosis not present

## 2014-11-28 MED ORDER — IOHEXOL 300 MG/ML  SOLN
100.0000 mL | Freq: Once | INTRAMUSCULAR | Status: AC | PRN
  Administered 2014-11-28: 100 mL via INTRAVENOUS

## 2014-12-02 ENCOUNTER — Encounter: Payer: Self-pay | Admitting: Cardiovascular Disease

## 2014-12-02 ENCOUNTER — Telehealth: Payer: Self-pay | Admitting: *Deleted

## 2014-12-02 ENCOUNTER — Ambulatory Visit (INDEPENDENT_AMBULATORY_CARE_PROVIDER_SITE_OTHER): Payer: BLUE CROSS/BLUE SHIELD | Admitting: Cardiovascular Disease

## 2014-12-02 ENCOUNTER — Encounter: Payer: Self-pay | Admitting: Internal Medicine

## 2014-12-02 VITALS — BP 158/104 | HR 89 | Ht 65.0 in | Wt 211.0 lb

## 2014-12-02 DIAGNOSIS — I10 Essential (primary) hypertension: Secondary | ICD-10-CM | POA: Diagnosis not present

## 2014-12-02 DIAGNOSIS — R002 Palpitations: Secondary | ICD-10-CM

## 2014-12-02 DIAGNOSIS — E785 Hyperlipidemia, unspecified: Secondary | ICD-10-CM

## 2014-12-02 DIAGNOSIS — R413 Other amnesia: Secondary | ICD-10-CM | POA: Diagnosis not present

## 2014-12-02 DIAGNOSIS — R0683 Snoring: Secondary | ICD-10-CM

## 2014-12-02 DIAGNOSIS — E669 Obesity, unspecified: Secondary | ICD-10-CM

## 2014-12-02 DIAGNOSIS — E1159 Type 2 diabetes mellitus with other circulatory complications: Secondary | ICD-10-CM

## 2014-12-02 MED ORDER — LOSARTAN POTASSIUM 100 MG PO TABS: 100.0000 mg | ORAL_TABLET | Freq: Every day | ORAL | Status: DC

## 2014-12-02 MED ORDER — DOXAZOSIN MESYLATE 8 MG PO TABS: 8.0000 mg | ORAL_TABLET | Freq: Every day | ORAL | Status: DC

## 2014-12-02 NOTE — Telephone Encounter (Signed)
lmov for we forgot to schedule a 59m fu with Dr Rockey Situ.

## 2014-12-02 NOTE — Progress Notes (Signed)
Patient ID: Carrie Singh, female    DOB: [birth year], 59 y.o.   MRN: [id number]  HPI Comments: 59 year old female with history of DM2, HTN dating back to her 20's previously on atenolol, amlodipine, HCTZ, clonidine, and nifedapine though had to stop 2/2 intolerances,  HLD, right breast breast carcinoma s/p lumpectomy on 01/08/2008 with negative sentinel lymph node biopsy, and obesity who presents to clinic today for evaluation of malignant HTN.  CT scan of the abdomen showing mild diffuse atherosclerosis of the descending aorta no significant CAD noted of the distal RCA and circumflex  Husband presents with her. He reports systolic pressure typically 150 or higher on a regular basis She has been having more headaches, migraines. She is scheduled to see neurology She has been taking Cardura 6 mg daily with hydralazine 3 times per day  Her biggest complaint is problems with memory, migraines, balance Disrupted sleep  EKG on today's visit shows normal sinus rhythm with rate 89 bpm, right bundle branch block, left anterior fascicular block  Other past medical history Seen in the  ED on 10/11/2014 for evaluation of intermittment numbness involving the left face, fatigue, and feeling of imbalance.  She was found to have a BP of 204/115.   CT head showed no acute intracranial abnormalities.    PCP on 10/14/2014 her BP was 158/100. At that time she was taking lisinopril 40 mg daily and clonidine 0.1 mg bid. She has previously not tolerated amlodipine 2/2 LE edema, HCTZ led to elevated SCr, atenolol led to fatigue, clonidine gave her a dry mouth and fatigue   No Known Allergies  Current Outpatient Prescriptions on File Prior to Visit  Medication Sig Dispense Refill  . hydrALAZINE (APRESOLINE) 100 MG tablet Take 100 mg by mouth 3 (three) times daily.     . metFORMIN (GLUCOPHAGE) 1000 MG tablet TAKE 1 TABLET BY MOUTH TWICE A DAY WITH A MEAL 60 tablet 5  . pravastatin (PRAVACHOL) 40 MG tablet TAKE 1  TABLET (40 MG TOTAL) BY MOUTH DAILY. 30 tablet 5  . zonisamide (ZONEGRAN) 50 MG capsule Take 1 capsule (50 mg total) by mouth daily. 30 capsule 6   No current facility-administered medications on file prior to visit.    Past Medical History  Diagnosis Date  . Diabetes mellitus without complication ([place])   . Hypertension   . Hyperlipidemia   . Obesity   . Migraines   . Breast cancer, right breast ([place]) 12/2007    a. s/p chemo, radiation, and lumpectomy with negative sentinel lymph node biopsy   . Breast cancer [facility])     Past Surgical History  Procedure Laterality Date  . Abdominal hysterectomy    . Vocal cord nodules    . Cyst excision    . Ovary tumor    . Breast lumpectomy    . Foot surgery      Social History  reports that she has never smoked. She does not have any smokeless tobacco history on file. She reports that she does not drink alcohol or use illicit drugs.  Family History family history includes Asthma in her father; Cancer in her brother, father, and sister; Heart disease in her father; Stroke in her mother. There is no history of Diabetes.   Review of Systems  Constitutional: Positive for fatigue.  Respiratory: Negative.   Cardiovascular: Negative.   Gastrointestinal: Negative.   Musculoskeletal: Negative.   Allergic/Immunologic: Negative.   Neurological: Positive for headaches.  Hematological: Negative.   Psychiatric/Behavioral: Positive  for dysphoric mood.  All other systems reviewed and are negative.   BP 158/104 mmHg  Pulse 89  Ht 5\' 5"  (1.651 m)  Wt 211 lb (95.709 kg)  BMI 35.11 kg/m2  Physical Exam  Constitutional: She is oriented to person, place, and time. She appears well-developed and well-nourished.  HENT:  Head: Normocephalic.  Nose: Nose normal.  Mouth/Throat: Oropharynx is clear and moist.  Eyes: Conjunctivae are normal. Pupils are equal, round, and reactive to light.  Neck: Normal range of motion. Neck supple. No JVD present.   Cardiovascular: Normal rate, regular rhythm, normal heart sounds and intact distal pulses.  Exam reveals no gallop and no friction rub.   No murmur heard. Pulmonary/Chest: Effort normal and breath sounds normal. No respiratory distress. She has no wheezes. She has no rales. She exhibits no tenderness.  Abdominal: Soft. Bowel sounds are normal. She exhibits no distension. There is no tenderness.  Musculoskeletal: Normal range of motion. She exhibits no edema or tenderness.  Lymphadenopathy:    She has no cervical adenopathy.  Neurological: She is alert and oriented to person, place, and time. Coordination normal.  Skin: Skin is warm and dry. No rash noted. No erythema.  Psychiatric: She has a normal mood and affect. Her behavior is normal. Judgment and thought content normal.

## 2014-12-02 NOTE — Assessment & Plan Note (Addendum)
Etiology of her memory issues is unclear, she has follow-up with  neurology

## 2014-12-02 NOTE — Patient Instructions (Addendum)
You are doing well.  Please take doxazosin 8 mg once a day at night  Please start losartan one a day in the morning  Please restart hydralazine 1/2 pill 2 to 3 times a day as needed for blood pressure >150  Please call with blood pressure numbers in the next few weeks  Please call us if you have new issues that need to be addressed before your next appt.  Your physician wants you to follow-up in: 3 months.  You will receive a reminder letter in the mail two months in advance. If you don't receive a letter, please call our office to schedule the follow-up appointment.

## 2014-12-02 NOTE — Assessment & Plan Note (Signed)
Encouraged her to stay on the pravastatin

## 2014-12-02 NOTE — Assessment & Plan Note (Signed)
Discussed symptoms with her husband, does not seem to have apnea

## 2014-12-02 NOTE — Assessment & Plan Note (Signed)
Dietary guide provided Recommended weight loss, low carbohydrate foods

## 2014-12-02 NOTE — Assessment & Plan Note (Signed)
We have encouraged continued exercise, careful diet management in an effort to lose weight. 

## 2014-12-02 NOTE — Assessment & Plan Note (Signed)
She reports having worsening headaches. Unclear if this is exacerbated by hydralazine Recommended that she take losartan 100 mg in the morning, increase her Cardura up to 8 mg in the evening She is only taking 6 mg of Cardura If blood pressure continues to run high, for now would use hydralazine 50 mg pills (100 mg cut in half)

## 2014-12-04 ENCOUNTER — Ambulatory Visit (INDEPENDENT_AMBULATORY_CARE_PROVIDER_SITE_OTHER): Payer: BLUE CROSS/BLUE SHIELD | Admitting: Neurology

## 2014-12-04 ENCOUNTER — Encounter: Payer: Self-pay | Admitting: Neurology

## 2014-12-04 VITALS — BP 160/120 | HR 82 | Resp 20 | Ht 65.5 in | Wt 211.0 lb

## 2014-12-04 DIAGNOSIS — G4730 Sleep apnea, unspecified: Secondary | ICD-10-CM

## 2014-12-04 DIAGNOSIS — G4710 Hypersomnia, unspecified: Secondary | ICD-10-CM | POA: Diagnosis not present

## 2014-12-04 DIAGNOSIS — R0683 Snoring: Secondary | ICD-10-CM

## 2014-12-04 DIAGNOSIS — G43111 Migraine with aura, intractable, with status migrainosus: Secondary | ICD-10-CM | POA: Diagnosis not present

## 2014-12-04 DIAGNOSIS — F329 Major depressive disorder, single episode, unspecified: Secondary | ICD-10-CM

## 2014-12-04 DIAGNOSIS — F450 Somatization disorder: Secondary | ICD-10-CM

## 2014-12-04 DIAGNOSIS — G44011 Episodic cluster headache, intractable: Secondary | ICD-10-CM

## 2014-12-04 DIAGNOSIS — G8929 Other chronic pain: Secondary | ICD-10-CM | POA: Diagnosis not present

## 2014-12-04 DIAGNOSIS — G4701 Insomnia due to medical condition: Secondary | ICD-10-CM

## 2014-12-04 DIAGNOSIS — F32A Depression, unspecified: Secondary | ICD-10-CM

## 2014-12-04 NOTE — Patient Instructions (Signed)
Cluster Headache Cluster headaches are deeply painful. They normally occur on one side of your head, but they may switch sides. Often, cluster headaches:  Are severe.  Happen often for a few weeks or months and then go away for a while.  Last from 15 minutes to 3 hours.  Happen at the same time each day.  Happen at night.  Happen many times a day. HOME CARE  During times when you have cluster headaches:  Get the same amount of sleep every night, at the same time each night.  Avoid alcohol.  Stop smoking if you smoke. GET HELP IF:  There are changes in how bad or how often your headaches happen.  Your medicines are not helping. GET HELP RIGHT AWAY IF:  You pass out (faint).  You become weak or lose feeling (have numbness) on one side of your body or face.  You see two of everything (double vision).  You feel sick to your stomach (nauseous) or throw up (vomit) and do not stop after several hours.  You are off balance or have trouble talking or walking.  You have neck pain or stiffness.  You have a fever. MAKE SURE YOU:  Understand these instructions.  Will watch your condition.  Will get help right away if you are not doing well or get worse.   This information is not intended to replace advice given to you by your health care provider. Make sure you discuss any questions you have with your health care provider.   Document Released: 02/11/2004 Document Revised: 01/24/2014 Document Reviewed: 07/26/2012 Elsevier Interactive Patient Education 2016 Elsevier Inc. Hypersomnia Hypersomnia is when you feel extremely tired during the day even though you're getting plenty of sleep at night. You may need to take naps during the day, and you may also be extremely difficult to wake up when you are sleeping.  CAUSES  The cause of your hypersomnia may not be known. Hypersomnia may be caused by:   Medicines.  Sleep disorders, such as narcolepsy.  Trauma or injury to your  head or nervous system.  Using drugs or alcohol.  Tumors.  Medical conditions, such as depression or hypothyroidism.  Genetics. SIGNS AND SYMPTOMS  The main symptoms of hypersomnia include:   Feeling extremely tired throughout the day.  Being very difficult to wake up.  Sleeping for longer and longer periods.  Taking naps throughout the day. Other symptoms may include:   Feeling:  Restless.  Annoyed.  Anxious.  Low energy.  Having difficulty:  Remembering.  Speaking.  Thinking.  Losing your appetite.  Experiencing hallucinations. DIAGNOSIS  Hypersomnia may be diagnosed by:  Medical history and physical exam. This will include a sleep history.  Completing sleep logs.  Tests may also be done, such as:  Polysomnography.  Multiple sleep latency test (MSLT). TREATMENT  There is no cure for hypersomnia, but treatment can be very effective in helping manage the condition. Treatment may include:  Lifestyle and sleeping strategies to help cope with the condition.  Stimulant medicines.  Treating any underlying causes of hypersomnia. HOME CARE INSTRUCTIONS  Take medicines only as directed by your health care provider.  Schedule short naps for when you feel sleepiest during the day. Tell your employer or teachers that you have hypersomnia. You may be able to adjust your schedule to include time for naps.  Avoid drinking alcohol or caffeinated beverages.  Do not eat a heavy meal before bedtime. Eat at about the same times every day.  Do not drive or operate heavy machinery if you are sleepy.  Do not swim or go out on the water without a life jacket.  If possible, adjust your schedule so that you do not have to work or be active at night.  Keep all follow-up visits as directed by your health care provider. This is important. SEEK MEDICAL CARE IF:   You have new symptoms.  Your symptoms get worse. SEEK IMMEDIATE MEDICAL CARE IF:  You have serious  thoughts of hurting yourself or someone else.   This information is not intended to replace advice given to you by your health care provider. Make sure you discuss any questions you have with your health care provider.   Document Released: 12/24/2001 Document Revised: 01/24/2014 Document Reviewed: 08/08/2013 Elsevier Interactive Patient Education 2016 Elsevier Inc. Major Depressive Disorder Major depressive disorder is a mental illness. It also may be called clinical depression or unipolar depression. Major depressive disorder usually causes feelings of sadness, hopelessness, or helplessness. Some people with this disorder do not feel particularly sad but lose interest in doing things they used to enjoy (anhedonia). Major depressive disorder also can cause physical symptoms. It can interfere with work, school, relationships, and other normal everyday activities. The disorder varies in severity but is longer lasting and more serious than the sadness we all feel from time to time in our lives. Major depressive disorder often is triggered by stressful life events or major life changes. Examples of these triggers include divorce, loss of your job or home, a move, and the death of a family member or close friend. Sometimes this disorder occurs for no obvious reason at all. People who have family members with major depressive disorder or bipolar disorder are at higher risk for developing this disorder, with or without life stressors. Major depressive disorder can occur at any age. It may occur just once in your life (single episode major depressive disorder). It may occur multiple times (recurrent major depressive disorder). SYMPTOMS People with major depressive disorder have either anhedonia or depressed mood on nearly a daily basis for at least 2 weeks or longer. Symptoms of depressed mood include:  Feelings of sadness (blue or down in the dumps) or emptiness.  Feelings of hopelessness or  helplessness.  Tearfulness or episodes of crying (may be observed by others).  Irritability (children and adolescents). In addition to depressed mood or anhedonia or both, people with this disorder have at least four of the following symptoms:  Difficulty sleeping or sleeping too much.   Significant change (increase or decrease) in appetite or weight.   Lack of energy or motivation.  Feelings of guilt and worthlessness.   Difficulty concentrating, remembering, or making decisions.  Unusually slow movement (psychomotor retardation) or restlessness (as observed by others).   Recurrent wishes for death, recurrent thoughts of self-harm (suicide), or a suicide attempt. People with major depressive disorder commonly have persistent negative thoughts about themselves, other people, and the world. People with severe major depressive disorder may experiencedistorted beliefs or perceptions about the world (psychotic delusions). They also may see or hear things that are not real (psychotic hallucinations). DIAGNOSIS Major depressive disorder is diagnosed through an assessment by your health care provider. Your health care provider will ask aboutaspects of your daily life, such as mood,sleep, and appetite, to see if you have the diagnostic symptoms of major depressive disorder. Your health care provider may ask about your medical history and use of alcohol or drugs, including prescription medicines. Your health  care provider also may do a physical exam and blood work. This is because certain medical conditions and the use of certain substances can cause major depressive disorder-like symptoms (secondary depression). Your health care provider also may refer you to a mental health specialist for further evaluation and treatment. TREATMENT It is important to recognize the symptoms of major depressive disorder and seek treatment. The following treatments can be prescribed for this disorder:    Medicine. Antidepressant medicines usually are prescribed. Antidepressant medicines are thought to correct chemical imbalances in the brain that are commonly associated with major depressive disorder. Other types of medicine may be added if the symptoms do not respond to antidepressant medicines alone or if psychotic delusions or hallucinations occur.  Talk therapy. Talk therapy can be helpful in treating major depressive disorder by providing support, education, and guidance. Certain types of talk therapy also can help with negative thinking (cognitive behavioral therapy) and with relationship issues that trigger this disorder (interpersonal therapy). A mental health specialist can help determine which treatment is best for you. Most people with major depressive disorder do well with a combination of medicine and talk therapy. Treatments involving electrical stimulation of the brain can be used in situations with extremely severe symptoms or when medicine and talk therapy do not work over time. These treatments include electroconvulsive therapy, transcranial magnetic stimulation, and vagal nerve stimulation.   This information is not intended to replace advice given to you by your health care provider. Make sure you discuss any questions you have with your health care provider.   Document Released: 04/30/2012 Document Revised: 01/24/2014 Document Reviewed: 04/30/2012 Elsevier Interactive Patient Education [employer].

## 2014-12-04 NOTE — Progress Notes (Signed)
SLEEP MEDICINE CLINIC   Provider:  Larey Seat, M D  Referring Provider: Jearld Fenton, NP Primary Care Physician:  Webb Silversmith, NP  Chief Complaint  Patient presents with  . New Patient (Initial Visit)    snores, tired during day, rm 10, with husband    HPI:  Carrie Singh is a 59 y.o. afro -[place] female , right handed and seen here upon request of Dr. Jaynee Eagles for a sleep consultation.    Chief complaint according to patient : ' I don't know if I have a sleep problem or not "   The patient was originally referred by her NP [facility]  for headache evaluation. She has a medical history of depression, is a breast cancer survivor, has migraines and sleep related headaches, diabetes, high cholesterol and hypertension. She was tried for headache treatment on Fioricet, topiramate, Imitrex which she all failed. On topiramate she felt like a zombie she stated she was dazed confused disoriented. The Topamax was discontinued. Originally her headaches were supposed to have started by late September or early October. She has reported that she snores and her husband has witnessed this. She feels excessively daytime sleepy and fatigued and during the day can easily fall asleep when not physically active or stimulated. Often she wakes up with a headache from sleep. The morning headache is described as dull, throbbing more of an achiness or pressure sensation. The headache affects the left temple and eye, are  throbbing and continue as  a pressure sensation. She does report photophobia.  She often gets nauseated with headaches and smells bother her. She had had visual auras of blurring lines and lights flashing. She had some word finding difficulties when she suffered a migrainous headache.  Independent , she has reported moments when she stares off and won't respond. Dr. Jaynee Eagles has reported that the patient is not treated for depression although she appeared depressed. The patient reported another type  of headache a sudden sharp and stabbing sensation that wakes her out of sleep at times. These headaches can occur several nights in a row they have occurred sometimes more than once per night. They also occur occasionally in daytime when she is at rest for example sitting in a recliner watching TV. They do not last very long but are perceived as severe 10-10 .    Sleep habits are as follows: The patient is currently not working outside the home. She has not worked in September 30 due to her headaches. She reports that she nods off during the day when she's not physically active or stimulated. Mostly in front of the TV in a recliner. Between 10 and 11 PM she usually retreats to her bedroom. It will take between 15 and 45 minutes to fall asleep depending on the day. She describes that her sleep is very fragmented she will wake up between 1 and 2 in the morning and then again about 4 AM and at 6 AM. She reports 3-4 bathroom breaks at night. If she has a headache at the time she wakes up from sleep she has more trouble going back to sleep. Some nights she has vivid dreams,but  less since being on "so many medications" . She wakes up between 7 and 8:00, spontaneously. This used to be the time she goes to work. She estimates her nocturnal sleep time in total treatment-6 hours nightly. But she naps frequently in daytime and some of her naps are as long as 60 minutes. She may even  fall asleep in the morning hours before known but sitting in a chair and she frequently falls asleep watching TV in the afternoon and evening. Her headaches are present 3-4 times per week. The intensity is lower now, 2 out of 10.    Sleep medical history and family sleep history: Insomnia, the patient's older brother has been tested for sleep apnea and uses CPAP, he also has a seizure disorder. The patient has no history of sleepwalking or nightmares.  Social history: Married, since late September she has not been able to return to  work.  Review of Systems: Out of a complete 14 system review, the patient complains of only the following symptoms, and all other reviewed systems are negative. Headaches,  episodic cluster and hypercapnia type. Migraines.  Legs giving out, arms giving out, sore, no spasms. Epworth score 14 , Fatigue severity score 57  , depression score 12 out of 15 points- significant clinical depression.  She has trouble to raise her arms over shoulder level. Trouble climbing stairs. Achiness in thighs. ?? Statin myopathy?    Social History   Social History  . Marital Status: Married    Spouse Name: [name]  . Number of Children: N/A  . Years of Education: 12+   Occupational History  . [employer]    Social History Main Topics  . Smoking status: Never Smoker   . Smokeless tobacco: Not on file  . Alcohol Use: No  . Drug Use: No  . Sexual Activity: Yes   Other Topics Concern  . Not on file   Social History Narrative   Lives at home with husband.   Caffeine use: 1-2 drinks per month    Family History  Problem Relation Age of Onset  . Stroke Mother   . Heart disease Father   . Asthma Father   . Cancer Father     colon  . Cancer Sister     lung  . Cancer Brother     colon  . Diabetes Neg Hx     Past Medical History  Diagnosis Date  . Diabetes mellitus without complication ([place])   . Hypertension   . Hyperlipidemia   . Obesity   . Migraines   . Breast cancer, right breast ([place]) 12/2007    a. s/p chemo, radiation, and lumpectomy with negative sentinel lymph node biopsy   . Breast cancer [facility])     Past Surgical History  Procedure Laterality Date  . Abdominal hysterectomy    . Vocal cord nodules    . Cyst excision    . Ovary tumor    . Breast lumpectomy    . Foot surgery      Current Outpatient Prescriptions  Medication Sig Dispense Refill  . doxazosin (CARDURA) 8 MG tablet Take 1 tablet (8 mg total) by mouth daily. 30 tablet 11  . hydrALAZINE (APRESOLINE) 100 MG tablet  Take 100 mg by mouth 3 (three) times daily.     Marland Kitchen losartan (COZAAR) 100 MG tablet Take 1 tablet (100 mg total) by mouth daily. 30 tablet 11  . metFORMIN (GLUCOPHAGE) 1000 MG tablet TAKE 1 TABLET BY MOUTH TWICE A DAY WITH A MEAL 60 tablet 5  . pravastatin (PRAVACHOL) 40 MG tablet TAKE 1 TABLET (40 MG TOTAL) BY MOUTH DAILY. 30 tablet 5  . zonisamide (ZONEGRAN) 50 MG capsule Take 1 capsule (50 mg total) by mouth daily. 30 capsule 6   No current facility-administered medications for this visit.    Allergies as of  12/04/2014  . (No Known Allergies)    Vitals: BP 160/120 mmHg  Pulse 82  Resp 20  Ht 5' 5.5" (1.664 m)  Wt 211 lb (95.709 kg)  BMI 34.57 kg/m2 Last Weight:  Wt Readings from Last 1 Encounters:  12/04/14 211 lb (95.709 kg)   [id number] mass index is 34.57 kg/(m^2).     Last Height:   Ht Readings from Last 1 Encounters:  12/04/14 5' 5.5" (1.664 m)    Physical exam:  General: The patient is awake, alert and appears not in acute distress. The patient is well groomed. Head: Normocephalic, atraumatic. Neck is supple. Mallampati 3-4  neck circumference:15. Nasal airflow  Restricted  TMJ is not evident . Retrognathia grade 3. Neck pain, tension.  Cardiovascular:  Regular rate and rhythm , without  murmurs or carotid bruit, and without distended neck veins. Respiratory: Lungs : no wheezing with  auscultation. Skin:  Ankle edema, or rash Trunk: BMI is elevated . The patient's posture is hunched.    Neurologic exam : The patient is awake and alert, oriented to place and time.   Memory subjective described as Impaired, she speaks slow, almost deliberately ,    Attention span & concentration ability appears impaired, tangential thought process. Speech is delayed, slowed, without prosody- without dysarthria, but dysphonia .  Mood and affect are appropriate.  Cranial nerves: Pupils are equal and briskly reactive to light. Funduscopic exam without evidence of pallor or edema.  Extraocular movements in vertical and horizontal planes with nystagmus. Bilateral ptosis,  exagerrated blink reflex, constant  eye lid flutter during the eye exam. Visual fields by finger perimetry are intact. Hearing to finger rub intact.  Facial sensation intact to fine touch.Facial motor strength is symmetric and tongue and uvula move midline. Shoulder shrug was symmetrical.  Motor exam:  Normal tone, muscle bulk and symmetric strength in all extremities. Sensory:  Fine touch, pinprick and vibration were tested in all extremities. Proprioception tested in the upper extremities was normal. Coordination: Rapid alternating movements in the fingers/hands was normal. Finger-to-nose maneuver  normal without evidence of ataxia, dysmetria or tremor. Gait and station: Patient walks without assistive device and is able unassisted to climb up to the exam table. Strength within normal limits.  Stance is stable and normal. .Romberg testing is negative.  Deep tendon reflexes: in the  upper and lower extremities are symmetric and intact. Babinski maneuver response is downgoing.  The patient was advised of the nature of the diagnosed sleep related headache disorder , the treatment options and risks for general a health and wellness arising from not treating the condition.  I spent more than 50 minutes of face to face time with the patient. Greater than 50% of time was spent in counseling and coordination of care. We have discussed the diagnosis and differential and I answered the patient's questions.  I strongly urge the patient to undergo depression treatment.   Review of laboratory results from nurse practitioner Garnette Gunner shows a comprehensive metabolic panel with elevated bilirubin at 1.5, glomerular filtration rate 55 and elevated creatinine at 1.06. The patient is status post radiation and had 5 years of oral chemotherapy in the treatment of breast cancer.  Assessment:  After physical and neurologic examination,  review of laboratory studies,  Personal review of imaging studies, reports of other /same  Imaging studies ,  Results of polysomnography/ neurophysiology testing and pre-existing records as far as provided in visit., my assessment is ;  0) hypersomnia in a patient with  interrupted, fragmented sleep difficulties to initiate sleep before midnight and also taking naps during the daytime. I would like to obtain a sleep study for this patient because she reports excessive daytime sleepiness with an Epworth score 14 as well as very high degrees of fatigue. Depression may play a underlying role in her fatigue and sleepiness but I do think there are organic reasons as well. Her husband has noted her to snore. She has gained weight, she falls asleep very easily when the waiting room etc. We will evaluate her for obstructive sleep apnea in a split-night polysomnography. 1) depression; the patient has a prolonged QT time on her last EKG so a tricyclic antidepressant  would pose some risk of making her bradycardic. I would recommend Zoloft or Pristiq at a low dose to treat depression and hopefully also reduce the frequency of headaches at the same time. This may help the patient to return to work. Start melatonin. Start to keep busy in daytime and leave the house.    2) 2 types of headache - both sleep related. One in the form of morning headaches with migrainous character as the day progresses, the other one as episodic cluster headache. To evaluate for sleep related headache capnography will be added to the patient's polysomnography and we will she attention to her oxygen saturation.   3)She is treated for hypertension and hypotension likely has exacerbated because of underlying pain. Antihypertensive medication also has a potential to cause drowsiness and daytime fatigue and in some patients exacerbates underlying depression.  4) cognitive dysfunction to be evaluated by primary neurologist. Pseudodementia of major  depression? Sleep deprivation .   5) lack of sleep hygiene. Her sleep is fragmented sometimes by headaches but sometimes due to spontaneous arousals. I would like for her to take melatonin 30 minutes before she intends to go to bed. I explained sleep hygiene basics by reducing nap times keeping nap times 2:30 minutes or less eliminating screen light from the bedroom and keeping the bedroom cool, quiet and dark. I would recommend not to use any caffeine-containing beverages after lunchtime. The intended bedtime would be before midnight I would like for Mrs. Lashley to watch her TV programs in a separate room and retreat to the bedroom to sleep but not to have any other distractions there. She may find deeper and more restorative sleep if she takes a hot shower or bath before going to bed.   Dear Vivien Rota,  Here is my Assessment and Plan in short:   Plan:  Treatment plan and additional workup : A polysomnography was capnography, start over-the-counter melatonin in a dose between 1 and 5 mg and take it about half an hour before intended bedtime. Advanced your bedtime to before midnight, reduce or eliminate daytime naps. She is to Expose herself to daytime light at a higher degree , improving her alertness Eliminate screen lights and artificial lights from the bedroom.     Asencion Partridge Venkat Ankney MD  12/04/2014   CC: Jearld Fenton, [address] [place], [place] [address]

## 2014-12-05 ENCOUNTER — Encounter: Payer: Self-pay | Admitting: Cardiovascular Disease

## 2014-12-08 ENCOUNTER — Other Ambulatory Visit: Payer: Self-pay

## 2014-12-08 ENCOUNTER — Encounter: Payer: Self-pay | Admitting: Internal Medicine

## 2014-12-08 MED ORDER — DOXAZOSIN MESYLATE 8 MG PO TABS: 8.0000 mg | ORAL_TABLET | Freq: Two times a day (BID) | ORAL | Status: DC

## 2014-12-09 ENCOUNTER — Telehealth: Payer: Self-pay

## 2014-12-09 NOTE — Telephone Encounter (Signed)
Called Sena Slate at [facility] at [phone] ext [phone]---lmovm to verify if they need a formal letter with Dx and limitations or need a new form

## 2014-12-09 NOTE — Telephone Encounter (Signed)
Received records request [employer], forwarded to [employer] for processing.  

## 2014-12-10 ENCOUNTER — Encounter: Payer: Self-pay | Admitting: Internal Medicine

## 2014-12-17 ENCOUNTER — Telehealth: Payer: Self-pay | Admitting: *Deleted

## 2014-12-17 ENCOUNTER — Other Ambulatory Visit: Payer: Self-pay | Admitting: Internal Medicine

## 2014-12-17 ENCOUNTER — Encounter: Payer: Self-pay | Admitting: Internal Medicine

## 2014-12-17 DIAGNOSIS — I10 Essential (primary) hypertension: Secondary | ICD-10-CM

## 2014-12-17 NOTE — Telephone Encounter (Signed)
Spoke w/ pt.  She states that her BP has been elevated, 168/97 last night, reports that "[place]". She states that her PCP would like to proceed w/ renal angiogram, but is waiting for Dr. Rockey Situ "to suggest it". Pt states this is very important to her and she would like to proceed. Apologized for delay, advised her to call w/ urgent messages instead of sending MyChart, that the message had limited information, and that PCP has not requested Dr. Donivan Scull assistance. She would like for Dr. Rockey Situ to send Webb Silversmith, NP his suggestion on whether or not she can order a renal angiogram and if he has any more suggestions regarding her BP. She states that she would like a call back by the end of the day.

## 2014-12-17 NOTE — Telephone Encounter (Signed)
Would proceed with CT scan of the kidney, no problem  Need to clarify her medications for blood pressure From the notes, it would appear she is taking losartan 100 mg daily, Cardura 8 mg twice a day Hydralazine 50 mg 3 times a day? If blood pressure continues to run high, could increase hydralazine up to 100 mg 3 times a day  Kidney issue is unrelated to her blood pressure

## 2014-12-17 NOTE — Telephone Encounter (Signed)
Pt calling asking about a mychart message she sent and is asking if we can call her about this.  She said if she did not hear from [place] in 30 min she would call back.  Stated to her not sure if we will have answer we are waiting on what doctor to respond.  She understood but still wanted to get the answer by him.  Please advise

## 2014-12-18 NOTE — Telephone Encounter (Signed)
Spoke w/ pt.  Advised her of Dr. Donivan Scull recommendation and that I am forwarding to Webb Silversmith, NP so her test can be set up. She reports that she is currently taking losartan 100 mg daily, Cardura 8 mg BID and has already increased her hydralazine to 100 mg TID. She would like to know if she has any wiggle room on the hydralazine and can increase this further? Please advise.  Thank you.

## 2014-12-19 MED ORDER — ISOSORBIDE MONONITRATE ER 30 MG PO TB24: 30.0000 mg | ORAL_TABLET | Freq: Every day | ORAL | Status: DC

## 2014-12-19 NOTE — Addendum Note (Signed)
Addended by: Dede Query R on: 12/19/2014 02:39 PM   Modules accepted: Orders

## 2014-12-19 NOTE — Telephone Encounter (Signed)
If blood pressure continues to run high, would start isosorbide mononitrate 30 mg daily Possibility for a mild headache for a few days then will resolve  continue to monitor blood pressure

## 2014-12-19 NOTE — Telephone Encounter (Signed)
Spoke w/ pt. Advised her of Dr. Donivan Scull recommendation.  She is hesitant, as she states that she does not want any additional meds until the cause of her elevated BP is found. She has not heard back from PCP regarding date & time for renal angiogram, assured that they are aware and working on it.  She is agreeable to trying isosorbide, but requests a 30 day supply, as she "doesn't plan on being on it long." She is appreciative of the call and will let us know if we can be of further assistance.

## 2014-12-22 ENCOUNTER — Other Ambulatory Visit: Payer: Self-pay | Admitting: Physician Assistant

## 2014-12-23 ENCOUNTER — Encounter: Payer: Self-pay | Admitting: Internal Medicine

## 2014-12-23 ENCOUNTER — Ambulatory Visit (INDEPENDENT_AMBULATORY_CARE_PROVIDER_SITE_OTHER): Payer: BLUE CROSS/BLUE SHIELD

## 2014-12-23 ENCOUNTER — Other Ambulatory Visit: Payer: Self-pay | Admitting: Internal Medicine

## 2014-12-23 DIAGNOSIS — R404 Transient alteration of awareness: Secondary | ICD-10-CM

## 2014-12-23 DIAGNOSIS — I10 Essential (primary) hypertension: Secondary | ICD-10-CM

## 2014-12-23 DIAGNOSIS — R4182 Altered mental status, unspecified: Secondary | ICD-10-CM | POA: Diagnosis not present

## 2014-12-23 NOTE — Procedures (Signed)
    History:  Carrie Singh is a 59 year old patient with a history of headaches. The patient had a recent episode of loss of consciousness, going limp. The patient is being evaluated for this episode.  This is a routine EEG. No skull defects are noted. Medications include Cardura, hydralazine, Imdur, Cozaar, metformin, Pravachol, and Zonegran.   EEG classification: Normal awake and drowsy  Description of the recording: The background rhythms of this recording consists of a fairly well modulated medium amplitude alpha rhythm of 9 Hz that is reactive to eye opening and closure. As the record progresses, the patient appears to remain in the waking state throughout the recording. Photic stimulation was not performed. Hyperventilation was performed, resulting in a minimal buildup of the background rhythm activities without significant slowing seen. Toward the end of the recording, the patient enters the drowsy state with slight symmetric slowing seen. The patient never enters stage II sleep. At no time during the recording does there appear to be evidence of spike or spike wave discharges or evidence of focal slowing. EKG monitor shows no evidence of cardiac rhythm abnormalities with a heart rate of 60.  Impression: This is a normal EEG recording in the waking and drowsy state. No evidence of ictal or interictal discharges are seen.

## 2014-12-24 ENCOUNTER — Telehealth: Payer: Self-pay | Admitting: *Deleted

## 2014-12-24 NOTE — Telephone Encounter (Signed)
-----   Message from Melvenia Beam, MD sent at 12/23/2014  7:38 PM EST ----- Let patient know eeg was normal thanks

## 2014-12-24 NOTE — Telephone Encounter (Signed)
Spoke to pt about normal EEG per Dr Jaynee Eagles. Pt verbalized understanding.

## 2014-12-25 ENCOUNTER — Ambulatory Visit
Admission: RE | Admit: 2014-12-25 | Discharge: 2014-12-25 | Disposition: A | Discharge status: Home / Self Care | Payer: BLUE CROSS/BLUE SHIELD | Source: Ambulatory Visit | Attending: Internal Medicine | Admitting: Internal Medicine

## 2014-12-25 ENCOUNTER — Encounter: Payer: Self-pay | Admitting: Neurology

## 2014-12-25 DIAGNOSIS — Z853 Personal history of malignant neoplasm of breast: Secondary | ICD-10-CM

## 2014-12-26 ENCOUNTER — Telehealth: Payer: Self-pay | Admitting: Neurology

## 2014-12-26 ENCOUNTER — Other Ambulatory Visit: Payer: Self-pay | Admitting: Neurology

## 2014-12-26 ENCOUNTER — Encounter: Payer: Self-pay | Admitting: Cardiovascular Disease

## 2014-12-26 DIAGNOSIS — G43019 Migraine without aura, intractable, without status migrainosus: Secondary | ICD-10-CM

## 2014-12-26 MED ORDER — ZONISAMIDE 100 MG PO CAPS: 100.0000 mg | ORAL_CAPSULE | Freq: Every day | ORAL | Status: DC

## 2014-12-26 NOTE — Telephone Encounter (Signed)
Carrie Singh, This patient has not been called to schedule her sleep study yet. She was seen by Dr. Brett Fairy almost a month ago. Can you please look into it? She has very bad headaches and keeps contacting me but I think it is posisbky due to OSA and if so I can't help her, she needs a sleep syudy. Can you follo wup please asap?

## 2014-12-26 NOTE — Telephone Encounter (Signed)
Lossie Faes, Vaughan Basta; is this lady scheduled for a study? CD

## 2014-12-29 NOTE — Telephone Encounter (Signed)
I have called this patient and left message to get her scheduled

## 2014-12-29 NOTE — Telephone Encounter (Signed)
Noted, thank you for calling to get her scheduled.

## 2014-12-30 ENCOUNTER — Encounter: Payer: Self-pay | Admitting: Neurology

## 2014-12-30 ENCOUNTER — Telehealth: Payer: Self-pay | Admitting: Neurology

## 2014-12-30 NOTE — Telephone Encounter (Signed)
Called and spoke to both pt and husband. Advised per Dr Jaynee Eagles that she cannot fill out disability paperwork. They verified this is what they needed. They verbalized understanding. Advised they can request records and get her office note if they need that. They can come into office and sign release form to get these.

## 2014-12-30 NOTE — Telephone Encounter (Signed)
Carrie Singh, can you look into the following? And call patient? I am not filling out disability for her so not sure what this is. thanks   Sent: 12/30/2014  9:27 AM       To: [facility] Clinical Pool    Subject: Non-Urgent Medical Question                 Good morning Carrie Singh: I have been out of work since October 3rd battling with high blood pressure & migraines. My body & mind is very unstable to work. I have seen several doctors in your practice as well as the Cardiologist ?& my physical doctor who referred me to you. The reason I am sendinding this notice this morning is that I received a call from [employer] from my job that they sent your office paperwork to find out the stays of my conditions & was informed your fax was down so [employer] has suspended my claim & only giving your office till thurs 12/15 to report my medical history to continue my disability claim. I informed them I have a sleep study -12/23 as well as a renal artery test 12/15. I would appreciate your help with getting information to [employer]. My caseworker is Thurmond Butts & his fax [phone] thanks!

## 2014-12-30 NOTE — Telephone Encounter (Signed)
Thanks, I do not feel she is disabled by her headaches. Thanks

## 2014-12-31 ENCOUNTER — Telehealth: Payer: Self-pay | Admitting: Neurology

## 2014-12-31 NOTE — Telephone Encounter (Signed)
error 

## 2015-01-01 ENCOUNTER — Encounter: Payer: Self-pay | Admitting: Cardiovascular Disease

## 2015-01-01 ENCOUNTER — Ambulatory Visit (INDEPENDENT_AMBULATORY_CARE_PROVIDER_SITE_OTHER): Payer: BLUE CROSS/BLUE SHIELD | Admitting: Cardiovascular Disease

## 2015-01-01 ENCOUNTER — Encounter: Payer: Self-pay | Admitting: Internal Medicine

## 2015-01-01 ENCOUNTER — Ambulatory Visit (HOSPITAL_COMMUNITY): Admission: RE | Admit: 2015-01-01 | Payer: BLUE CROSS/BLUE SHIELD | Source: Ambulatory Visit

## 2015-01-01 ENCOUNTER — Other Ambulatory Visit: Payer: Self-pay | Admitting: Internal Medicine

## 2015-01-01 VITALS — BP 172/106 | HR 90 | Ht 65.0 in | Wt 212.2 lb

## 2015-01-01 DIAGNOSIS — I10 Essential (primary) hypertension: Secondary | ICD-10-CM | POA: Diagnosis not present

## 2015-01-01 MED ORDER — NEBIVOLOL HCL 5 MG PO TABS: 5.0000 mg | ORAL_TABLET | Freq: Every day | ORAL | Status: DC

## 2015-01-01 NOTE — Assessment & Plan Note (Signed)
Carrie Singh  was referred to me for evaluation of persistent hypertension. She is a patient of Dr. Donivan Scull. She says that the blood pressure began to be more difficult to control 4-6 months ago. She has had renal Dopplers performed 11/13/14 which were normal and a CT angiogram 11/28/14 which was normal as well. She is on doxazosin, hydralazine and losartan. I'm going to begin her on diastolic 5 mg a day and have her see Carrie Singh back in 4 weeks for further evaluation. This can be titrated up to 10 mg a day and a calcium channel blocker such as amlodipine can be added as well.Marland Kitchen

## 2015-01-01 NOTE — Progress Notes (Signed)
01/01/2015 Carrie Singh   [birth year]  [id number]  Primary Physician Carrie Silversmith, NP Primary Cardiologist: Carrie Harp MD Carrie Singh   HPI:  Carrie Singh is a 59 year old moderately overweight married African-American female no children is accompanied today by her husband Carrie Singh. She is a patient of Carrie Singh in [place]. She has a history of treated hypertension, diabetes and hyperlipidemia. She has never had a heart attack or stroke. She has occasional chest pain but has had a negative Myoview stress test in the last several months. She is has had difficult to control hypertension for the last 4-6 months and isn't on multiple antihypertensive medications. Renal Dopplers performed 11/13/14 were unrevealing as was a CT angiogram performed 11/28/14.   Current Outpatient Prescriptions  Medication Sig Dispense Refill  . doxazosin (CARDURA) 8 MG tablet Take 1 tablet (8 mg total) by mouth 2 (two) times daily. 60 tablet 11  . hydrALAZINE (APRESOLINE) 100 MG tablet Take 100 mg by mouth 3 (three) times daily.     . isosorbide mononitrate (IMDUR) 30 MG 24 hr tablet Take 1 tablet (30 mg total) by mouth daily. 30 tablet 6  . losartan (COZAAR) 100 MG tablet Take 1 tablet (100 mg total) by mouth daily. 30 tablet 11  . metFORMIN (GLUCOPHAGE) 1000 MG tablet TAKE 1 TABLET BY MOUTH TWICE A DAY WITH A MEAL 60 tablet 5  . pravastatin (PRAVACHOL) 40 MG tablet TAKE 1 TABLET (40 MG TOTAL) BY MOUTH DAILY. 30 tablet 5  . zonisamide (ZONEGRAN) 100 MG capsule Take 1 capsule (100 mg total) by mouth daily. 30 capsule 11  . nebivolol (BYSTOLIC) 5 MG tablet Take 1 tablet (5 mg total) by mouth daily. 30 tablet 11   No current facility-administered medications for this visit.    No Known Allergies  Social History   Social History  . Marital Status: Married    Spouse Name: Carrie Singh  . Number of Children: N/A  . Years of Education: 12+   Occupational History  . [employer]    Social  History Main Topics  . Smoking status: Never Smoker   . Smokeless tobacco: Never Used  . Alcohol Use: No  . Drug Use: No  . Sexual Activity: Yes   Other Topics Concern  . Not on file   Social History Narrative   Lives at home with husband.   Caffeine use: 1-2 drinks per month     Review of Systems: General: negative for chills, fever, night sweats or weight changes.  Cardiovascular: negative for chest pain, dyspnea on exertion, edema, orthopnea, palpitations, paroxysmal nocturnal dyspnea or shortness of breath Dermatological: negative for rash Respiratory: negative for cough or wheezing Urologic: negative for hematuria Abdominal: negative for nausea, vomiting, diarrhea, bright red blood per rectum, melena, or hematemesis Neurologic: negative for visual changes, syncope, or dizziness All other systems reviewed and are otherwise negative except as noted above.    Blood pressure 172/106, pulse 90, height 5\' 5"  (1.651 m), weight 212 lb 3.2 oz (96.253 kg).  General appearance: alert and no distress Neck: no adenopathy, no carotid bruit, no JVD, supple, symmetrical, trachea midline and thyroid not enlarged, symmetric, no tenderness/mass/nodules Lungs: clear to auscultation bilaterally Heart: regular rate and rhythm, S1, S2 normal, no murmur, click, rub or gallop Extremities: extremities normal, atraumatic, no cyanosis or edema  EKG normal sinus rhythm at 90 with right bundle-branch block and left axis deviation. I personally reviewed this EKG  ASSESSMENT AND PLAN:   HTN (  hypertension) Ms Singh  was referred to me for evaluation of persistent hypertension. She is a patient of Carrie Singh. She says that the blood pressure began to be more difficult to control 4-6 months ago. She has had renal Dopplers performed 11/13/14 which were normal and a CT angiogram 11/28/14 which was normal as well. She is on doxazosin, hydralazine and losartan. I'm going to begin her on diastolic 5 mg a day  and have her see Carrie Singh back in 4 weeks for further evaluation. This can be titrated up to 10 mg a day and a calcium channel blocker such as amlodipine can be added as well.Carrie Harp MD FACP,FACC,[facility], [facility] 01/01/2015 4:13 PM

## 2015-01-01 NOTE — Patient Instructions (Signed)
Medication Instructions:  START Bystolic 5 mg - take 1 tablet by mouth daily  >>A new prescription has been sent to the pharmacy electronically.  Labwork: NONE  Testing/Procedures: NONE  Follow-Up: Dr Gwenlyn Found recommends that you schedule a follow-up appointment in 1 month with Tommy Medal, PharmD, for a blood pressure check. Dr Gwenlyn Found recommends that you schedule a follow-up appointment in 3 months. You will receive a reminder letter in the mail two months in advance. If you don't receive a letter, please call our office to schedule the follow-up appointment.  If you need a refill on your cardiac medications before your next appointment, please call your pharmacy.   **If you are able to check your blood pressure on a daily basis, please keep a record of these readings and bring them with you to your blood pressure check with [name].

## 2015-01-02 ENCOUNTER — Telehealth: Payer: Self-pay | Admitting: Internal Medicine

## 2015-01-02 ENCOUNTER — Ambulatory Visit: Payer: BLUE CROSS/BLUE SHIELD | Admitting: Cardiovascular Disease

## 2015-01-02 ENCOUNTER — Encounter: Payer: Self-pay | Admitting: Cardiovascular Disease

## 2015-01-02 NOTE — Telephone Encounter (Signed)
Left message asking pt to call office Received disability paperwork for [employer] Need pt to come in and sign medical release form so i can send records to [facility]

## 2015-01-06 NOTE — Telephone Encounter (Signed)
Pt came in 12/19 to sign medical release form Paper work faxed 12/20

## 2015-01-07 ENCOUNTER — Telehealth: Payer: Self-pay | Admitting: *Deleted

## 2015-01-07 NOTE — Telephone Encounter (Signed)
Records faxed to [facility] on 01/07/15.

## 2015-01-09 ENCOUNTER — Ambulatory Visit (INDEPENDENT_AMBULATORY_CARE_PROVIDER_SITE_OTHER): Payer: BLUE CROSS/BLUE SHIELD | Admitting: Neurology

## 2015-01-09 DIAGNOSIS — F32A Depression, unspecified: Secondary | ICD-10-CM

## 2015-01-09 DIAGNOSIS — G4710 Hypersomnia, unspecified: Secondary | ICD-10-CM

## 2015-01-09 DIAGNOSIS — G44011 Episodic cluster headache, intractable: Secondary | ICD-10-CM

## 2015-01-09 DIAGNOSIS — F450 Somatization disorder: Secondary | ICD-10-CM

## 2015-01-09 DIAGNOSIS — G4730 Sleep apnea, unspecified: Secondary | ICD-10-CM

## 2015-01-09 DIAGNOSIS — G43111 Migraine with aura, intractable, with status migrainosus: Secondary | ICD-10-CM

## 2015-01-09 DIAGNOSIS — G8929 Other chronic pain: Secondary | ICD-10-CM

## 2015-01-09 DIAGNOSIS — R0683 Snoring: Secondary | ICD-10-CM

## 2015-01-09 DIAGNOSIS — F329 Major depressive disorder, single episode, unspecified: Secondary | ICD-10-CM

## 2015-01-09 DIAGNOSIS — G4701 Insomnia due to medical condition: Secondary | ICD-10-CM

## 2015-01-10 NOTE — Sleep Study (Signed)
Please see the scanned sleep study interpretation located in the procedure tab within the chart review section.   

## 2015-01-14 ENCOUNTER — Telehealth: Payer: Self-pay

## 2015-01-14 NOTE — Telephone Encounter (Signed)
Spoke to pt and advised her that her sleep study revealed no significant sleep apnea or significant PLMs results in significant sleep disruption. I advised her that if snoring is a concern, an ENT examination or a dental device can be used. Pt refused both of these at this time. I advised pt to sleep on her side if possible, and pt stated that she does sleep on her side. I advised her to lose weight, diet, and exercise if not contraindicated by her other physicians. I advised her to avoid driving or operating hazardous machinery while sleepy and to avoid caffeine containing beverages and chocolate. Pt verbalized understanding. Pt declined a follow up appt with Dr. Brett Fairy at this time. She knows to continue following up with Dr. Jaynee Eagles.

## 2015-01-16 ENCOUNTER — Ambulatory Visit (INDEPENDENT_AMBULATORY_CARE_PROVIDER_SITE_OTHER): Payer: BLUE CROSS/BLUE SHIELD | Admitting: Podiatry

## 2015-01-16 ENCOUNTER — Encounter: Payer: Self-pay | Admitting: Podiatry

## 2015-01-16 VITALS — BP 187/115 | HR 74 | Resp 12

## 2015-01-16 DIAGNOSIS — M779 Enthesopathy, unspecified: Secondary | ICD-10-CM

## 2015-01-16 MED ORDER — TRIAMCINOLONE ACETONIDE 10 MG/ML IJ SUSP
10.0000 mg | Freq: Once | INTRAMUSCULAR | Status: AC
  Administered 2015-01-16: 10 mg

## 2015-01-16 NOTE — Progress Notes (Signed)
Subjective:     Patient ID: Carrie Singh, female   DOB: October 24, 1955, 59 y.o.   MRN: [id number]  HPI patient presents with pain in the first metatarsal phalangeal joint lateral side that she states is been present for several months   Review of Systems     Objective:   Physical Exam  neurovascular status intact muscle strength adequate and inflammation on the lateral side of the first MPJ right with fluid buildup noted of short-term duration    Assessment:      inflammatory capsulitis right foot lateral side first MPJ    Plan:      carefully injected the joint surface 3 mg Kenalog 5 mg Xylocaine and instructed on physical therapy

## 2015-01-20 ENCOUNTER — Telehealth: Payer: Self-pay | Admitting: Cardiovascular Disease

## 2015-01-20 ENCOUNTER — Ambulatory Visit (INDEPENDENT_AMBULATORY_CARE_PROVIDER_SITE_OTHER): Payer: BLUE CROSS/BLUE SHIELD | Admitting: Neurology

## 2015-01-20 ENCOUNTER — Encounter: Payer: Self-pay | Admitting: Neurology

## 2015-01-20 VITALS — BP 138/88 | HR 69 | Ht 65.0 in | Wt 210.0 lb

## 2015-01-20 DIAGNOSIS — R413 Other amnesia: Secondary | ICD-10-CM

## 2015-01-20 DIAGNOSIS — G43009 Migraine without aura, not intractable, without status migrainosus: Secondary | ICD-10-CM

## 2015-01-20 MED ORDER — CITALOPRAM HYDROBROMIDE 20 MG PO TABS: 20.0000 mg | ORAL_TABLET | Freq: Every day | ORAL | Status: DC

## 2015-01-20 NOTE — Telephone Encounter (Signed)
[facility] is calling to find out what her symptoms were and the severity of the symptoms on her last visit w/ Dr. Gwenlyn Found . Please call . (Anyone who answer the phone can take the information )   Thanks

## 2015-01-20 NOTE — Patient Instructions (Signed)
Remember to drink plenty of fluid, eat healthy meals and do not skip any meals. Try to eat protein with a every meal and eat a healthy snack such as fruit or nuts in between meals. Try to keep a regular sleep-wake schedule and try to exercise daily, particularly in the form of walking, 20-30 minutes a day, if you can.   As far as your medications are concerned, I would like to suggest: citalopram 20mg  in the morning. Continue Zonisamide.  I would like to see you back in 6 months, sooner if we need to. Please call us with any interim questions, concerns, problems, updates or refill requests.   Our phone number is [phone]. We also have an after hours call service for urgent matters and there is a physician on-call for urgent questions. For any emergencies you know to call 911 or go to the nearest emergency room

## 2015-01-20 NOTE — Telephone Encounter (Signed)
Attempted to call, wait time is 5 min or greater.

## 2015-01-20 NOTE — Progress Notes (Signed)
[facility]    Provider:  Dr Jaynee Eagles Referring Provider: Jearld Fenton, NP Primary Care Physician:  Webb Silversmith, NP  CC: Headaches  Interval History: Blood pressure is under control. She feels very depressed. She is sad most days. She has thoughts she is going to die. She denies thoughts of hurting herself. Zonegran has helped. She can't get her thoughts and can't get her words together. That has been going on a while. Concentration has been poor. She has loss of memory. She cries in the office. She endorses significant mood problems. Husband here as well> she is not working, lot of stress in her life, the job is stressful. She is not performing well in her job. She can't her bills. Needs to follow up with primary care and psychiatry. Headaches are improved. Not as frequent. They can still be severe. She has them a few times a week.   HPI: Carrie Singh is a 60 y.o. female here as a referral from Dr. Garnette Gunner for headaches. She has a past medical history of hypertension, diabetes, high cholesterol, migraine, depression, breast cancer. Husband provides most information. She was tried on fioricet, topamax, imitrex and she failed all. The topamax made her "zombie like" and she was so tired she was dazed and found in the closet. Unclear if it was a medication effect. Husband brought her to the bathroom because she was confused and was laying on the floor in the closet but she was coherent and answering questions, patient says everything got black and felt dizzy. When husband brought her to the bathroom, she stopped wlking and her head fell on his shoulder and she sat on the commode. She was just sitting there, not totally limp, she did not fall, eyes were closed, husband tried to talk to patient and she said she could get up and walk and she walked to the bed and layed down for a while and felt better. Patient remembers feeling tired and dizzy, she was on topamax and having side effects  at that time and she was on other medication from the emergency room which was fioricet which also made her tired. The headaches started a month ago or longer. She snores, she feels very fatigued during the day, she can fall asleep very easily during the day, she wakes up with headaches. She has headaches during the day. She used to have headaches very bad 15 years ago and they never found out why. The headaches are on the left side, continuous, pressure, the bright light bothers her a little but she doesn't need to go into a dark room, she has nausea occasionally, smells very much bother her. She has had occ. Aura of lines but only when the headaches are severe. They can be severe 10/10 pain, she went to the ED. She has some word and speaking difficulty with the headaches. She has had moments where she will be staring and she won't respond. She is having memory problems. No vision changes. She has depression which is untreated. She is falling asleep today in the office. Her blood pressure has been excessively elevated and they are seeing cardiology and increasing the cardura.   Reviewed notes, labs and imaging from outside physicians, which showed:   Ct showed No acute intracranial abnormalities including mass lesion or mass effect, hydrocephalus, extra-axial fluid collection, midline shift, hemorrhage, or acute infarction, large ischemic events. Possible some non-specific white matter changes. (personally reviewed images)  CMP with slightly elevated creatinine 1.06, elevated bilirubin 1.5,  dec GFR 55 cbc nml  Review of Systems: Patient complains of symptoms per HPI as well as the following symptoms: Fatigue, palpitations, shortness of breath, snoring, feeling hot, feeling cold, spinning sensation, memory loss, confusion, headache, weakness, slurred speech, dizziness, snoring, restless legs, depression, anxiety, not enough sleep, decreased energy. Pertinent negatives per HPI. All others  negative.   Social History   Social History  . Marital Status: Married    Spouse Name: [name]  . Number of Children: N/A  . Years of Education: 12+   Occupational History  . [employer]    Social History Main Topics  . Smoking status: Never Smoker   . Smokeless tobacco: Never Used  . Alcohol Use: No  . Drug Use: No  . Sexual Activity: Yes   Other Topics Concern  . Not on file   Social History Narrative   Lives at home with husband.   Caffeine use: 1-2 drinks per month         Epworth Sleepiness Scale = 15 (as of 01/01/15)    Family History  Problem Relation Age of Onset  . Stroke Mother   . Heart disease Father   . Asthma Father   . Cancer Father     colon  . Cancer Sister     lung  . Cancer Brother     colon  . Diabetes Neg Hx     Past Medical History  Diagnosis Date  . Diabetes mellitus without complication ([place])   . Hypertension     resistant, negative renal Dopplers and negative CT angiogram  . Hyperlipidemia   . Obesity   . Migraines   . Breast cancer, right breast ([place]) 12/2007    a. s/p chemo, radiation, and lumpectomy with negative sentinel lymph node biopsy   . Breast cancer [facility])     Past Surgical History  Procedure Laterality Date  . Abdominal hysterectomy    . Vocal cord nodules    . Cyst excision    . Ovary tumor    . Breast lumpectomy    . Foot surgery      Current Outpatient Prescriptions  Medication Sig Dispense Refill  . doxazosin (CARDURA) 8 MG tablet Take 1 tablet (8 mg total) by mouth 2 (two) times daily. 60 tablet 11  . hydrALAZINE (APRESOLINE) 100 MG tablet Take 100 mg by mouth 3 (three) times daily.     . isosorbide mononitrate (IMDUR) 30 MG 24 hr tablet Take 1 tablet (30 mg total) by mouth daily. 30 tablet 6  . losartan (COZAAR) 100 MG tablet Take 1 tablet (100 mg total) by mouth daily. 30 tablet 11  . metFORMIN (GLUCOPHAGE) 1000 MG tablet TAKE 1 TABLET BY MOUTH TWICE A DAY WITH A MEAL 60 tablet 5  . nebivolol (BYSTOLIC) 5  MG tablet Take 1 tablet (5 mg total) by mouth daily. 30 tablet 11  . pravastatin (PRAVACHOL) 40 MG tablet TAKE 1 TABLET (40 MG TOTAL) BY MOUTH DAILY. 30 tablet 5  . zonisamide (ZONEGRAN) 100 MG capsule Take 1 capsule (100 mg total) by mouth daily. 30 capsule 11   No current facility-administered medications for this visit.    Allergies as of 01/20/2015  . (No Known Allergies)    Vitals: BP 131/83 mmHg  Pulse 78  Ht 5\' 5"  (1.651 m)  Wt 210 lb (95.255 kg)  BMI 34.95 kg/m2 Last Weight:  Wt Readings from Last 1 Encounters:  01/20/15 210 lb (95.255 kg)   Last Height:   Ht  Readings from Last 1 Encounters:  01/20/15 5\' 5"  (1.651 m)   Focused neurologic exam:  Gen: Teary, crying in the office  Cranial Nerves:  The pupils are equal, round, and reactive to light. The fundi are flat. Visual fields are full to finger confrontation. Extraocular movements are intact. Trigeminal sensation is intact and the muscles of mastication are normal. The face is symmetric. The palate elevates in the midline. Hearing intact. Voice is normal. Shoulder shrug is normal. The tongue has normal motion without fasciculations.   Assessment/Plan: She has a past medical history of hypertension, diabetes, high cholesterol, migraine, depression, breast cancer.headaches have improved with blood pressure control and with Zonisamide. She complains of concentrations problems, sadness, stress. Mood disorder is also likely exacerbating her headaches and causing her concentration and memory problems complaints.   Continue Zonisamide (Patient is already on HTN medications and is titrating her meds with cardiology for uncontrolled HTN so don't want to start this class of medication, she has elevated bilirubin so don't want to start depakote, her QTc is elevated so can't start TCA or venlafaxine due to risks. We are a little limited in our selection). Check zonisamide level.   Will start Citalopram for depression and  headaches. She is to follow up with primary care for titration. Discussed common side effects, stop for any suicidal ideation. Will check TSH.  Memory changes: Check b12, TSH, MMA  Sarina Ill, MD  [facility] [address] [place], [place] [address]  Phone [phone] Fax [phone]  A total of 40 minutes was spent face-to-face with this patient. Over half this time was spent on counseling patient on the migraine and depresson diagnosis and different diagnostic and therapeutic options available.

## 2015-01-21 ENCOUNTER — Telehealth: Payer: Self-pay | Admitting: *Deleted

## 2015-01-21 NOTE — Telephone Encounter (Signed)
LVM for pt to call about results. Gave [facility] phone number.  

## 2015-01-21 NOTE — Telephone Encounter (Signed)
Pt returned Emma's call °

## 2015-01-21 NOTE — Telephone Encounter (Signed)
Pt called and said Thurmond Butts from Disability need the information today. If not,they will case her case tomorrow.Please call Ryan at [phone] number is [phone]. Her claim number is [id number].

## 2015-01-21 NOTE — Telephone Encounter (Signed)
Called pt and relayed results per Dr Jaynee Eagles note below. Pt verbalized understanding.

## 2015-01-21 NOTE — Telephone Encounter (Addendum)
**Note De-Identified Mannix Kroeker Obfuscation** Jobe Gibbon at [facility] is asking questions regarding the pts disability due to memory changes and confusion. She is advised that Dr Gwenlyn Found did see the pt on 01/01/15 for HTN only as he is a cardiologist. Jobe Gibbon checked the pts information and then stated that she was going to send all of the pts paperwork back to Thurmond Butts (case worker) as cardiology cannot answer the questions needed. She thanked me for calling them back.

## 2015-01-21 NOTE — Telephone Encounter (Signed)
-----   Message from Melvenia Beam, MD sent at 01/21/2015 12:27 PM EST ----- Her CMP shows elevated creatinine, decreased GFR which is chronic and stable. Her potassium is slightly elevated but not concerning, she should just regularly follow with primary care. b12 looks good.

## 2015-01-22 LAB — THYROID PANEL WITH TSH
Free Thyroxine Index: 1.7 (ref 1.2–4.9)
T3 Uptake Ratio: 28 % (ref 24–39)
T4, Total: 6.2 ug/dL (ref 4.5–12.0)
TSH: 3.85 u[IU]/mL (ref 0.450–4.500)

## 2015-01-22 LAB — COMPREHENSIVE METABOLIC PANEL
ALT: 15 IU/L (ref 0–32)
AST: 16 IU/L (ref 0–40)
Albumin/Globulin Ratio: 1.5 (ref 1.1–2.5)
Albumin: 4.1 g/dL (ref 3.6–4.8)
Alkaline Phosphatase: 70 IU/L (ref 39–117)
BUN/Creatinine Ratio: 15 (ref 11–26)
BUN: 17 mg/dL (ref 8–27)
Bilirubin Total: 0.7 mg/dL (ref 0.0–1.2)
CO2: 24 mmol/L (ref 18–29)
Calcium: 9.9 mg/dL (ref 8.7–10.3)
Chloride: 109 mmol/L — ABNORMAL HIGH (ref 96–106)
Creatinine, Ser: 1.1 mg/dL — ABNORMAL HIGH (ref 0.57–1.00)
GFR calc Af Amer: 63 mL/min/{1.73_m2} (ref >59)
GFR calc non Af Amer: 54 mL/min/{1.73_m2} — ABNORMAL LOW (ref >59)
Globulin, Total: 2.7 g/dL (ref 1.5–4.5)
Glucose: 98 mg/dL (ref 65–99)
Potassium: 5.5 mmol/L — ABNORMAL HIGH (ref 3.5–5.2)
Sodium: 146 mmol/L — ABNORMAL HIGH (ref 134–144)
Total Protein: 6.8 g/dL (ref 6.0–8.5)

## 2015-01-22 LAB — ZONISAMIDE LEVEL: Zonisamide: 3 ug/mL — ABNORMAL LOW (ref 10.0–40.0)

## 2015-01-22 LAB — B12 AND FOLATE PANEL
Folate: >20 ng/mL (ref >3.0)
Vitamin B-12: 401 pg/mL (ref 211–946)

## 2015-01-22 LAB — METHYLMALONIC ACID, SERUM: Methylmalonic Acid: 127 nmol/L (ref 0–378)

## 2015-01-26 ENCOUNTER — Encounter: Payer: Self-pay | Admitting: Internal Medicine

## 2015-01-26 ENCOUNTER — Ambulatory Visit: Payer: BLUE CROSS/BLUE SHIELD | Admitting: Internal Medicine

## 2015-01-26 ENCOUNTER — Ambulatory Visit (INDEPENDENT_AMBULATORY_CARE_PROVIDER_SITE_OTHER): Payer: BLUE CROSS/BLUE SHIELD | Admitting: Internal Medicine

## 2015-01-26 VITALS — BP 160/90 | HR 72 | Temp 98.2°F | Wt 211.0 lb

## 2015-01-26 DIAGNOSIS — F450 Somatization disorder: Secondary | ICD-10-CM

## 2015-01-26 DIAGNOSIS — I10 Essential (primary) hypertension: Secondary | ICD-10-CM

## 2015-01-26 DIAGNOSIS — R404 Transient alteration of awareness: Secondary | ICD-10-CM | POA: Diagnosis not present

## 2015-01-26 DIAGNOSIS — F329 Major depressive disorder, single episode, unspecified: Secondary | ICD-10-CM

## 2015-01-26 DIAGNOSIS — R413 Other amnesia: Secondary | ICD-10-CM

## 2015-01-26 DIAGNOSIS — E1159 Type 2 diabetes mellitus with other circulatory complications: Secondary | ICD-10-CM

## 2015-01-26 DIAGNOSIS — G44011 Episodic cluster headache, intractable: Secondary | ICD-10-CM

## 2015-01-26 DIAGNOSIS — E785 Hyperlipidemia, unspecified: Secondary | ICD-10-CM

## 2015-01-26 NOTE — Progress Notes (Signed)
Pre visit review using our clinic review tool, if applicable. No additional management support is needed unless otherwise documented below in the visit note. 

## 2015-01-26 NOTE — Assessment & Plan Note (Signed)
A1C at goal No microalbumin secondary to ARB therapy She declines vaccines Foot exam today Continue Metformin Encouraged her to continue yearly eye exams

## 2015-01-26 NOTE — Assessment & Plan Note (Signed)
At goal on Pravastatin Encouraged her to consume a low fat diet

## 2015-01-26 NOTE — Assessment & Plan Note (Signed)
?   R/t Depression Dr. Jaynee Eagles recently started pt on Celexa Will continue to monitor

## 2015-01-26 NOTE — Patient Instructions (Signed)
Cluster Headache  Cluster headaches are recognized by their pattern of deep, intense head pain. They normally occur on one side of your head, but they may "switch sides" in subsequent episodes. Typically, cluster headaches:   · Are severe in nature.    · Occur repeatedly over weeks to months and are followed by periods of no headaches.    · Can last from 15 minutes to 3 hours.    · Occur at the same time each day, often at night.    · Occur several times a day.  CAUSES  The exact cause of cluster headaches is not known. Alcohol use may be associated with cluster headaches.  SIGNS AND SYMPTOMS   · Severe pain that begins in or around your eye or temple.    · One-sided head pain.    · Feeling sick to your stomach (nauseous).    · Sensitivity to light.    · Runny nose.    · Eye redness, tearing, and nasal stuffiness on the side of your head where you are experiencing pain.    · Sweaty, pale skin of the face.    · Droopy or swollen eyelid.    · Restlessness.  DIAGNOSIS   Cluster headaches are diagnosed based on symptoms and a physical exam. Your health care provider may order a CT scan or an MRI of your head or lab tests to see if your headaches are caused by other medical conditions.   TREATMENT   · Medicines for pain relief and to prevent recurrent attacks. Some people may need a combination of medicines.  · Oxygen for pain relief.    · Biofeedback programs to help reduce headache pain.    It may be helpful to keep a headache diary. This may help you find a trend for what is triggering your headaches. Your health care provider can develop a treatment plan.   HOME CARE INSTRUCTIONS   During cluster periods:   · Follow a regular sleep schedule. Do not vary the amount and time that you sleep from day to day. It is important to stay on the same schedule during a cluster period to help prevent headaches.    · Avoid alcohol.    · Stop smoking if you smoke.    SEEK MEDICAL CARE IF:  · You have any changes from your previous  cluster headaches either in intensity or frequency.    · You are not getting relief from medicines you are taking.    SEEK IMMEDIATE MEDICAL CARE IF:   · You faint.    · You have weakness or numbness, especially on one side of your body or face.    · You have double vision.    · You have nausea or vomiting that is not relieved within several hours.    · You cannot keep your balance or have difficulty talking or walking.    · You have neck pain or stiffness.    · You have a fever.  MAKE SURE YOU:  · Understand these instructions.    · Will watch your condition.    · Will get help right away if you are not doing well or get worse.     This information is not intended to replace advice given to you by your health care provider. Make sure you discuss any questions you have with your health care provider.     Document Released: 01/03/2005 Document Revised: 10/24/2012 Document Reviewed: 07/26/2012  Elsevier Interactive Patient Education ©2016 Elsevier Inc.

## 2015-01-26 NOTE — Progress Notes (Signed)
Subjective:    Patient ID: Carrie Singh, female    DOB: October 06, 1955, 60 y.o.   MRN: [id number]  HPI  Pt presents to the clinic today followup ongoing medical condtions:  HTN: She is following with Dr. Rockey Situ for management of her resistant HTN. Last note from 01/01/15 reviewed. She has had a normal renal artery ultrasound, normal CT angiogram and normal stress test. She is on Losartan, Hydralazine and Doxazosin. Dr. Gwenlyn Found added Bystolic to her regimen at the last visit. Her BP today is 160/90. She follows up with Dr. Rockey Situ,  Frequent Headaches and Altered Consciousness: She reports she is still having headaches 3-4 days per week. They are located on the left side of her head. She denies blurred vision or double vision. She does have mild dizziness. She has senstivity to light but not sound. She denies nausea or vomiting. She does get some relief with BC. She is on Zonegran but has not noticed that it has made a difference. She has seen Dr. Jaynee Eagles at [facility] for further evaluation. Note from 01/20/15 reviewed.  Depression: She was recently started on Celexa by Dr. Jaynee Eagles at [facility]. She has not noticed any side effects. She reports her depression is triggered by her health status, she is out of work and having trouble paying her bills. She denies SI/HI.  DM 2: Her last A1C was 6.6% 10/17/14. She is taking Metformin as prescribed. She does not check her sugars. She denies hypoglycemic events. She does not take flu or pneumonia vaccines.   HLD: Her last LDL was 91. She is taking Pravastatin as prescribed. She denies muscle aches. She does try to consume a low fat diet. She is not taking a baby ASA daily.  Review of Systems      Past Medical History  Diagnosis Date  . Diabetes mellitus without complication ([place])   . Hypertension     resistant, negative renal Dopplers and negative CT angiogram  . Hyperlipidemia   . Obesity   . Migraines   . Breast cancer, right breast ([place]) 12/2007    a. s/p chemo,  radiation, and lumpectomy with negative sentinel lymph node biopsy   . Breast cancer [facility])     Current Outpatient Prescriptions  Medication Sig Dispense Refill  . citalopram (CELEXA) 20 MG tablet Take 1 tablet (20 mg total) by mouth daily. 30 tablet 6  . doxazosin (CARDURA) 8 MG tablet Take 1 tablet (8 mg total) by mouth 2 (two) times daily. 60 tablet 11  . hydrALAZINE (APRESOLINE) 100 MG tablet Take 100 mg by mouth 3 (three) times daily.     . isosorbide mononitrate (IMDUR) 30 MG 24 hr tablet Take 1 tablet (30 mg total) by mouth daily. 30 tablet 6  . losartan (COZAAR) 100 MG tablet Take 1 tablet (100 mg total) by mouth daily. 30 tablet 11  . metFORMIN (GLUCOPHAGE) 1000 MG tablet TAKE 1 TABLET BY MOUTH TWICE A DAY WITH A MEAL 60 tablet 5  . nebivolol (BYSTOLIC) 5 MG tablet Take 1 tablet (5 mg total) by mouth daily. 30 tablet 11  . pravastatin (PRAVACHOL) 40 MG tablet TAKE 1 TABLET (40 MG TOTAL) BY MOUTH DAILY. 30 tablet 5  . zonisamide (ZONEGRAN) 100 MG capsule Take 1 capsule (100 mg total) by mouth daily. 30 capsule 11   No current facility-administered medications for this visit.    No Known Allergies  Family History  Problem Relation Age of Onset  . Stroke Mother   . Heart disease  Father   . Asthma Father   . Cancer Father     colon  . Cancer Sister     lung  . Cancer Brother     colon  . Diabetes Neg Hx     Social History   Social History  . Marital Status: Married    Spouse Name: [name]  . Number of Children: N/A  . Years of Education: 12+   Occupational History  . [employer]    Social History Main Topics  . Smoking status: Never Smoker   . Smokeless tobacco: Never Used  . Alcohol Use: No  . Drug Use: No  . Sexual Activity: Yes   Other Topics Concern  . Not on file   Social History Narrative   Lives at home with husband.   Caffeine use: 1-2 drinks per month         Epworth Sleepiness Scale = 15 (as of 01/01/15)     Constitutional: Pt reports fatigue and  headache. Denies fever, malaise, or abrupt weight changes.  HEENT: Denies eye pain, eye redness, ear pain, ringing in the ears, wax buildup, runny nose, nasal congestion, bloody nose, or sore throat. Respiratory: Denies difficulty breathing, shortness of breath, cough or sputum production.   Cardiovascular: Denies chest pain, chest tightness, palpitations or swelling in the hands or feet.  Skin: Denies redness, rashes, lesions or ulcercations.  Neurological: Pt reports difficulty with memory and difficulty with speech. Denies dizziness, or problems with balance and coordination.  Psych: Pt reports anxiety and depression. Denies SI/HI.  No other specific complaints in a complete review of systems (except as listed in HPI above).  Objective:   Physical Exam   BP 160/90 mmHg  Pulse 72  Temp(Src) 98.2 F (36.8 C) (Oral)  Wt 211 lb (95.709 kg)  SpO2 98% Wt Readings from Last 3 Encounters:  01/26/15 211 lb (95.709 kg)  01/20/15 210 lb (95.255 kg)  01/01/15 212 lb 3.2 oz (96.253 kg)    General: Appears her stated age, well developed, well nourished in NAD. HEENT: Head: normal shape and size; Eyes: sclera white, no icterus, conjunctiva pink, PERRLA and EOMs intact;  Cardiovascular: Normal rate and rhythm. S1,S2 noted.  No murmur, rubs or gallops noted. No JVD or BLE edema. No carotid bruits noted. Pulmonary/Chest: Normal effort and positive vesicular breath sounds. No respiratory distress. No wheezes, rales or ronchi noted.  Neurological: Alert and oriented. Speech is slowed and jittery. Psychiatric: Mood and affect flat.  BMET    Component Value Date/Time   NA 146* 01/20/2015 1457   NA 140 10/20/2014 0500   NA 143 10/12/2013 1510   K 5.5* 01/20/2015 1457   K 3.9 10/12/2013 1510   CL 109* 01/20/2015 1457   CL 109* 10/12/2013 1510   CO2 24 01/20/2015 1457   CO2 29 10/12/2013 1510   GLUCOSE 98 01/20/2015 1457   GLUCOSE 160* 10/20/2014 0500   GLUCOSE 92 10/12/2013 1510   BUN 17  01/20/2015 1457   BUN 16 10/20/2014 0500   BUN 16 10/12/2013 1510   CREATININE 1.10* 01/20/2015 1457   CREATININE 1.07 10/12/2013 1510   CALCIUM 9.9 01/20/2015 1457   CALCIUM 9.4 10/12/2013 1510   GFRNONAA 54* 01/20/2015 1457   GFRNONAA 56* 10/12/2013 1510   GFRAA 63 01/20/2015 1457   GFRAA >60 10/12/2013 1510    Lipid Panel     Component Value Date/Time   CHOL 179 10/17/2014 1527   CHOL 186 04/03/2014 1101   TRIG 147  10/17/2014 1527   HDL 59 10/17/2014 1527   HDL 59.40 04/03/2014 1101   CHOLHDL 3.0 10/17/2014 1527   CHOLHDL 3 04/03/2014 1101   VLDL 28.0 04/03/2014 1101   LDLCALC 91 10/17/2014 1527   LDLCALC 99 04/03/2014 1101    CBC    Component Value Date/Time   WBC 7.8 10/20/2014 0500   WBC 4.9 10/12/2013 1510   WBC 4.2 08/13/2009 0946   RBC 4.37 10/20/2014 0500   RBC 4.64 10/12/2013 1510   RBC 4.24 08/13/2009 0946   HGB 12.8 10/20/2014 0500   HGB 13.5 10/12/2013 1510   HGB 12.8 08/13/2009 0946   HCT 37.2 10/20/2014 0500   HCT 40.5 10/12/2013 1510   HCT 36.8 08/13/2009 0946   PLT 212 10/20/2014 0500   PLT 278 10/12/2013 1510   PLT 271 08/13/2009 0946   MCV 85.2 10/20/2014 0500   MCV 87 10/12/2013 1510   MCV 87.0 08/13/2009 0946   MCH 29.3 10/20/2014 0500   MCH 29.1 10/12/2013 1510   MCH 30.2 08/13/2009 0946   MCHC 34.4 10/20/2014 0500   MCHC 33.3 10/12/2013 1510   MCHC 34.7 08/13/2009 0946   RDW 13.4 10/20/2014 0500   RDW 13.6 10/12/2013 1510   RDW 13.8 08/13/2009 0946   LYMPHSABS 2.1 10/11/2014 2343   LYMPHSABS 1.5 08/13/2009 0946   MONOABS 0.3 10/11/2014 2343   MONOABS 0.2 08/13/2009 0946   EOSABS 0.1 10/11/2014 2343   EOSABS 0.1 08/13/2009 0946   BASOSABS 0.0 10/11/2014 2343   BASOSABS 0.0 08/13/2009 0946    Hgb A1C Lab Results  Component Value Date   HGBA1C 6.6* 10/17/2014        Assessment & Plan:

## 2015-01-26 NOTE — Assessment & Plan Note (Signed)
Recently started on Celexa by Dr. Jaynee Eagles They have not noticed any effect from the medication yet Advised them to give it 4-5 more weeks then call me with an update

## 2015-01-26 NOTE — Assessment & Plan Note (Signed)
?   R/t depression Recently started on Celexa by Dr. Jaynee Eagles

## 2015-01-26 NOTE — Assessment & Plan Note (Signed)
Chronic She will continue to follow with neurology She will continue Zonegran

## 2015-01-26 NOTE — Assessment & Plan Note (Signed)
Still uncontrolled Labs and imaging studies reviewed Cardiology notes reviewed Continue current medications She follows up with Cardiology in January 2017

## 2015-01-28 ENCOUNTER — Telehealth: Payer: Self-pay | Admitting: Internal Medicine

## 2015-01-28 NOTE — Telephone Encounter (Signed)
The letter was faxed this morning

## 2015-01-28 NOTE — Telephone Encounter (Signed)
Pt called checking to see if the paperwork was faxed [employer] She stated they said they have not received any It needs to have case number on it #[id number] Please advise  Fax# [phone]

## 2015-02-04 ENCOUNTER — Encounter: Payer: Self-pay | Admitting: Internal Medicine

## 2015-02-04 ENCOUNTER — Telehealth: Payer: Self-pay | Admitting: Neurology

## 2015-02-04 NOTE — Telephone Encounter (Signed)
LFt vm for patient to call back disability form for patient at [facility] disability.

## 2015-02-04 NOTE — Telephone Encounter (Signed)
Irene/[employer] [phone] called regarding disability claim, requests to speak with nurse.

## 2015-02-04 NOTE — Telephone Encounter (Signed)
[name] with [employer] called back to speak with RN

## 2015-02-05 ENCOUNTER — Ambulatory Visit (INDEPENDENT_AMBULATORY_CARE_PROVIDER_SITE_OTHER): Payer: BLUE CROSS/BLUE SHIELD | Admitting: Pharmacist Clinician (PhC)/ Clinical Pharmacy Specialist

## 2015-02-05 VITALS — BP 170/110 | HR 72 | Ht 65.0 in | Wt 210.5 lb

## 2015-02-05 DIAGNOSIS — I10 Essential (primary) hypertension: Secondary | ICD-10-CM | POA: Diagnosis not present

## 2015-02-05 MED ORDER — CHLORTHALIDONE 25 MG PO TABS: 25.0000 mg | ORAL_TABLET | Freq: Every day | ORAL | Status: DC

## 2015-02-05 NOTE — Patient Instructions (Addendum)
Return for a a follow up appointment in 3 weeks  Your blood pressure today is 170/110  Check your blood pressure at home daily and keep record of the readings.  Take your BP meds as follows: start chlorthalidone 25 mg once daily in the mornings; switch Bystolic 5 mg dose to evenings; leave all other medications the same  Bring all of your meds, your BP cuff and your record of home blood pressures to your next appointment.  Exercise as you're able, try to walk approximately 30 minutes per day.  Keep salt intake to a minimum, especially watch canned and prepared boxed foods.  Eat more fresh fruits and vegetables and fewer canned items.  Avoid eating in fast food restaurants.    HOW TO TAKE YOUR BLOOD PRESSURE: . Rest 5 minutes before taking your blood pressure. .  Don't smoke or drink caffeinated beverages for at least 30 minutes before. . Take your blood pressure before (not after) you eat. . Sit comfortably with your back supported and both feet on the floor (don't cross your legs). . Elevate your arm to heart level on a table or a desk. . Use the proper sized cuff. It should fit smoothly and snugly around your bare upper arm. There should be enough room to slip a fingertip under the cuff. The bottom edge of the cuff should be 1 inch above the crease of the elbow. . Ideally, take 3 measurements at one sitting and record the average.

## 2015-02-05 NOTE — Telephone Encounter (Signed)
I have spoken with Carrie Singh this morning, and advised that we have been contacted by [facility] with a request for information.   I am not sure that I can speak with [employer]--Dr. Jaynee Singh has not completed disability paperwork for her.  Pt. sts. her pcp has taken her out of work due to memory loss, migraines.  She sts. [employer] is contacting us to confirm her dx. She has given verbal permission for me to share all med. info with [employer].  I have asked that she email me giving [place] permission to share med. info. with [employer] and she sts. she will do this now./fim

## 2015-02-05 NOTE — Telephone Encounter (Signed)
LMOM letting Carrie Singh know I did receive her email allowing me to speak with [employer].  I have spoken with [name] and confirmed that pt. was seen on 01-20-15, that she has had a sleep study that showed no sig. osa, and that she has a f/u appt. with Dr. Jaynee Eagles on 07-27-15.  [name] asked for a copy of Dr. Cathren Laine ov notes, and sleep study results.  I have advised that I will provide these once a signed med. rec. release is obtained, and asked that this be faxed to [phone]

## 2015-02-05 NOTE — Telephone Encounter (Signed)
I received faxed med. rec. release from [employer], and have faxed them a copy of pt's sleep study and Dr. Cathren Laine last ov note, fax # [phone]

## 2015-02-05 NOTE — Telephone Encounter (Signed)
The patient is calling back. She states she did email our office giving permission for [place] to share medical info with [place]. She wants to know if the fax was received. Please call and advise.

## 2015-02-06 ENCOUNTER — Ambulatory Visit: Payer: BLUE CROSS/BLUE SHIELD | Admitting: Cardiovascular Disease

## 2015-02-06 ENCOUNTER — Encounter: Payer: Self-pay | Admitting: Pharmacist Clinician (PhC)/ Clinical Pharmacy Specialist

## 2015-02-06 NOTE — Assessment & Plan Note (Addendum)
Much to her dismay, I am going to add another medication to her today.  I explained that with her pressure at 170/100, I can't safely take any medication away, but if I can get it down with a better combination of medications, we can probably eliminate one or two in the coming months.  Today she will start chlorthalidone 25 mg qam and switch the Bystolic to evenings.  She will need to get a BMET in about 7-10 days, as her last potassium was elevated at 5.5.  Based on her next appointment, I may cancel the one with Dr. Rockey Situ, as it was also to manage her BP.  I would like to get her off the isosorbide mono at the next visit, because with her history of migranes, it could be making things worse.  Would like to maybe re-challenge her with amlodipine, but will ask her about the edema before making that decision.  I will see her back in 3 weeks

## 2015-02-06 NOTE — Progress Notes (Signed)
02/06/2015 Carrie Singh March 30, 1955 [id number]   HPI:  Carrie Singh is a 60 y.o. female patient of Dr Carrie Singh, with a PMH below who presents today for hypertension clinic evaluation.  She has been working with several providers in the past year to get her blood pressure under better control.   She states she has been on several different medications, yet her BP still remains "all over the place".   She has dealt with hypertension since she was in her 50's.  After seeing patient I went back and reviewed her office visits for 2016, trying to piece together a history of what medications she has tried and any reported reactions.  She was initially on hydrochlorothiazide but then discontinued due to poor kidney function.  Later amlodipine was stopped stating lower extremity edema, then lisinopril for cough.  She was started on isosorbide mono in September, but it appears and disappears from her medication profile at different OV and I can't determine how much of it she took.  But she did develop worsening headaches shortly after starting and is currently being followed by neurology.  Today she states she takes it once daily.  She is quite frustrated by the lack of BP control and all the different medications.    Cardiac [id number] CP, negative Myoview  Family Hx: father died from HF at 92,   Social Hx: no tobacco or alcohol, does not drink caffeine  Diet: mostly eats at home, chicken and [place], frozen not canned vegetables; does not add salt to foods at the table and only occasionally when cooking  Exercise: walks 20-30 minutes around mall in [place] daily  Home BP readings: per patient range is 134/84 to 184/120, did not bring her cuff with her  Current antihypertensive medications: doxazosin 8 mg bid, hydralazine 100 mg tid, losartan 100 mg qam, nebivolol 5 mg qam,   Intolerances: amlodipine (edema); clonidine (dry mouth), lisinopril (cough)  Wt Readings from Last 3 Encounters:    02/06/15 210 lb 8 oz (95.482 kg)  01/26/15 211 lb (95.709 kg)  01/20/15 210 lb (95.255 kg)   BP Readings from Last 3 Encounters:  02/06/15 170/110  01/26/15 160/90  01/20/15 138/88   Pulse Readings from Last 3 Encounters:  02/06/15 72  01/26/15 72  01/20/15 69    Current Outpatient Prescriptions  Medication Sig Dispense Refill  . chlorthalidone (HYGROTON) 25 MG tablet Take 1 tablet (25 mg total) by mouth daily. 30 tablet 1  . citalopram (CELEXA) 20 MG tablet Take 1 tablet (20 mg total) by mouth daily. 30 tablet 6  . doxazosin (CARDURA) 8 MG tablet Take 1 tablet (8 mg total) by mouth 2 (two) times daily. 60 tablet 11  . hydrALAZINE (APRESOLINE) 100 MG tablet Take 100 mg by mouth 3 (three) times daily.     . isosorbide mononitrate (IMDUR) 30 MG 24 hr tablet Take 1 tablet (30 mg total) by mouth daily. 30 tablet 6  . losartan (COZAAR) 100 MG tablet Take 1 tablet (100 mg total) by mouth daily. 30 tablet 11  . metFORMIN (GLUCOPHAGE) 1000 MG tablet TAKE 1 TABLET BY MOUTH TWICE A DAY WITH A MEAL 60 tablet 5  . nebivolol (BYSTOLIC) 5 MG tablet Take 1 tablet (5 mg total) by mouth daily. 30 tablet 11  . pravastatin (PRAVACHOL) 40 MG tablet TAKE 1 TABLET (40 MG TOTAL) BY MOUTH DAILY. 30 tablet 5  . zonisamide (ZONEGRAN) 100 MG capsule Take 1 capsule (100 mg total) by mouth daily.  30 capsule 11   No current facility-administered medications for this visit.    No Known Allergies  Past Medical History  Diagnosis Date  . Diabetes mellitus without complication ([place])   . Hypertension     resistant, negative renal Dopplers and negative CT angiogram  . Hyperlipidemia   . Obesity   . Migraines   . Breast cancer, right breast ([place]) 12/2007    a. s/p chemo, radiation, and lumpectomy with negative sentinel lymph node biopsy   . Breast cancer ([facility])     Blood pressure 170/110, pulse 72, height 5\' 5"  (1.651 m), weight 210 lb 8 oz (95.482 kg).    Tommy Medal PharmD CPP [facility]

## 2015-02-10 ENCOUNTER — Encounter: Payer: Self-pay | Admitting: Internal Medicine

## 2015-02-11 ENCOUNTER — Encounter: Payer: Self-pay | Admitting: Internal Medicine

## 2015-02-18 ENCOUNTER — Encounter: Payer: Self-pay | Admitting: Internal Medicine

## 2015-02-19 ENCOUNTER — Other Ambulatory Visit: Payer: Self-pay | Admitting: Internal Medicine

## 2015-02-19 DIAGNOSIS — E875 Hyperkalemia: Secondary | ICD-10-CM

## 2015-02-20 ENCOUNTER — Other Ambulatory Visit (INDEPENDENT_AMBULATORY_CARE_PROVIDER_SITE_OTHER): Payer: BLUE CROSS/BLUE SHIELD

## 2015-02-20 DIAGNOSIS — I10 Essential (primary) hypertension: Secondary | ICD-10-CM | POA: Diagnosis not present

## 2015-02-20 LAB — COMPREHENSIVE METABOLIC PANEL
ALT: 14 U/L (ref 0–35)
AST: 14 U/L (ref 0–37)
Albumin: 4 g/dL (ref 3.5–5.2)
Alkaline Phosphatase: 60 U/L (ref 39–117)
BUN: 24 mg/dL — ABNORMAL HIGH (ref 6–23)
CO2: 31 mEq/L (ref 19–32)
Calcium: 9.2 mg/dL (ref 8.4–10.5)
Chloride: 101 mEq/L (ref 96–112)
Creatinine, Ser: 1.13 mg/dL (ref 0.40–1.20)
GFR: 62.79 mL/min (ref >60.00)
Glucose, Bld: 138 mg/dL — ABNORMAL HIGH (ref 70–99)
Potassium: 4 mEq/L (ref 3.5–5.1)
Sodium: 138 mEq/L (ref 135–145)
Total Bilirubin: 1 mg/dL (ref 0.2–1.2)
Total Protein: 7 g/dL (ref 6.0–8.3)

## 2015-02-26 ENCOUNTER — Ambulatory Visit (INDEPENDENT_AMBULATORY_CARE_PROVIDER_SITE_OTHER): Payer: BLUE CROSS/BLUE SHIELD | Admitting: Pharmacist Clinician (PhC)/ Clinical Pharmacy Specialist

## 2015-02-26 VITALS — BP 142/82 | HR 76 | Ht 65.0 in | Wt 207.7 lb

## 2015-02-26 DIAGNOSIS — I10 Essential (primary) hypertension: Secondary | ICD-10-CM | POA: Diagnosis not present

## 2015-02-26 MED ORDER — IRBESARTAN 300 MG PO TABS: 300.0000 mg | ORAL_TABLET | Freq: Every day | ORAL | Status: DC

## 2015-02-26 NOTE — Patient Instructions (Signed)
Call me if the irbesartan is too expensive and we'll try valsartan instead.  [name] at [phone]   Your blood pressure today is 142/82  (goal is to be <150/90 and closer to 140/90)  Check your blood pressure at home 3-5 times per week and keep record of the readings.  Take your BP meds as follows:   1.  Stop Bystolic when your current supply runs out  2.  Stop losartan when your current supply runs out.  Then start the irbesartan 300 mg once daily in place of it.    3.  Stop isosorbide now.  Put the bottle away for now.  4.  Continue with all of your other medications.  Bring all of your meds, your BP cuff and your record of home blood pressures to your next appointment.  Exercise as you're able, try to walk approximately 30 minutes per day.  Keep salt intake to a minimum, especially watch canned and prepared boxed foods.  Eat more fresh fruits and vegetables and fewer canned items.  Avoid eating in fast food restaurants.    HOW TO TAKE YOUR BLOOD PRESSURE: . Rest 5 minutes before taking your blood pressure. .  Don't smoke or drink caffeinated beverages for at least 30 minutes before. . Take your blood pressure before (not after) you eat. . Sit comfortably with your back supported and both feet on the floor (don't cross your legs). . Elevate your arm to heart level on a table or a desk. . Use the proper sized cuff. It should fit smoothly and snugly around your bare upper arm. There should be enough room to slip a fingertip under the cuff. The bottom edge of the cuff should be 1 inch above the crease of the elbow. . Ideally, take 3 measurements at one sitting and record the average.

## 2015-02-27 ENCOUNTER — Encounter: Payer: Self-pay | Admitting: Pharmacist Clinician (PhC)/ Clinical Pharmacy Specialist

## 2015-02-27 NOTE — Progress Notes (Signed)
02/27/2015 Carrie Singh December 24, 1955 [id number]   HPI:  Carrie Singh is a 60 y.o. female patient of Dr Gwenlyn Found, with a PMH below who presents today for hypertension clinic follow up.  When I saw her last month, she had worked with multiple providers trying to get her blood pressure under control.  Unfortunately many of these appointments were overlapping, thus different people making changes to what others had started.  Looking thru those reports it was hard to determine how long she had been on any given medication, and some meds would appear in one person's note, but not the next, then reappear later.   After seeing her initially, I went back and reviewed her office visits for 2016, trying to piece together a history of what medications she has tried and any reported reactions.  She was initially on hydrochlorothiazide but then discontinued due to poor kidney function.  Later amlodipine was stopped stating lower extremity edema, then lisinopril for cough.  She was started on isosorbide mono in September, but it appears and disappears from her medication profile at different OV and I can't determine how much of it she took.  But she did develop worsening headaches shortly after starting and is currently being followed by neurology.  Today she states she takes it once daily.  She is quite frustrated by the lack of BP control and all the different medications.  She has dealt with hypertension since she was in her 50's.  Today she complains of feeling tired when doing household chores, stating after sweeping one day she felt like she would "pass out" and had to sit for awhile.  She also notes occasional positional orthostatic changes.  Cardiac [id number] CP, negative Myoview  Family Hx: father died from HF at 74,   Social Hx: no tobacco or alcohol, does not drink caffeine  Diet: mostly eats at home, chicken and [place], frozen not canned vegetables; does not add salt to foods at the table and only  occasionally when cooking.  Has been working to decrease portion sizes and her blood sugar levels are improving  Exercise: walks 20-30 minutes around mall in [place] or at home, most days  Home BP readings: today she brings in list of twice daily readings.  With systolic readings, in the mornings she had 7/21 > 150 and in the evenings 3/19 > 150.  This is a big improvement for her.  Morning average was 139/91, evening 131/78  Current antihypertensive medications: doxazosin 8 mg bid, hydralazine 100 mg tid, losartan 100 mg qam, nebivolol 5 mg qpm, chlorthalidone 25 mg qam, isosorbide mono 30 mg qd  Intolerances: amlodipine (edema); clonidine (dry mouth), lisinopril (cough)  Wt Readings from Last 3 Encounters:  02/27/15 207 lb 11.2 oz (94.212 kg)  02/06/15 210 lb 8 oz (95.482 kg)  01/26/15 211 lb (95.709 kg)   BP Readings from Last 3 Encounters:  02/27/15 142/82  02/06/15 170/110  01/26/15 160/90   Pulse Readings from Last 3 Encounters:  02/27/15 76  02/06/15 72  01/26/15 72    Current Outpatient Prescriptions  Medication Sig Dispense Refill  . chlorthalidone (HYGROTON) 25 MG tablet Take 1 tablet (25 mg total) by mouth daily. 30 tablet 1  . citalopram (CELEXA) 20 MG tablet Take 1 tablet (20 mg total) by mouth daily. 30 tablet 6  . doxazosin (CARDURA) 8 MG tablet Take 1 tablet (8 mg total) by mouth 2 (two) times daily. 60 tablet 11  . hydrALAZINE (APRESOLINE) 100 MG tablet  Take 100 mg by mouth 3 (three) times daily.     . irbesartan (AVAPRO) 300 MG tablet Take 1 tablet (300 mg total) by mouth daily. 30 tablet 3  . isosorbide mononitrate (IMDUR) 30 MG 24 hr tablet Take 1 tablet (30 mg total) by mouth daily. 30 tablet 6  . losartan (COZAAR) 100 MG tablet Take 1 tablet (100 mg total) by mouth daily. 30 tablet 11  . metFORMIN (GLUCOPHAGE) 1000 MG tablet TAKE 1 TABLET BY MOUTH TWICE A DAY WITH A MEAL 60 tablet 5  . nebivolol (BYSTOLIC) 5 MG tablet Take 1 tablet (5 mg total) by mouth  daily. 30 tablet 11  . pravastatin (PRAVACHOL) 40 MG tablet TAKE 1 TABLET (40 MG TOTAL) BY MOUTH DAILY. 30 tablet 5  . zonisamide (ZONEGRAN) 100 MG capsule Take 1 capsule (100 mg total) by mouth daily. 30 capsule 11   No current facility-administered medications for this visit.    No Known Allergies  Past Medical History  Diagnosis Date  . Diabetes mellitus without complication ([place])   . Hypertension     resistant, negative renal Dopplers and negative CT angiogram  . Hyperlipidemia   . Obesity   . Migraines   . Breast cancer, right breast ([place]) 12/2007    a. s/p chemo, radiation, and lumpectomy with negative sentinel lymph node biopsy   . Breast cancer ([place])     Blood pressure 142/82, pulse 76, height 5\' 5"  (1.651 m), weight 207 lb 11.2 oz (94.212 kg).    Tommy Medal PharmD CPP [facility]

## 2015-02-28 NOTE — Assessment & Plan Note (Addendum)
Today her BP is much improved, from 170/110 at the last visit to 142/82 today.  She is also down about 3 pounds.  She is a little weepy in the office, frustrated with feeling poorly and regular headaches.  She has been working with neurology and trying different therapies to get them controlled.  Praised the improvement in her BP.  She is concerned about medication costs, as the Bystolic is [id number] and chlorthalidone $25.  This eats significantly into her budget.  I have asked her to stop the Bystolic when her current supply runs out.  At such a low dose, I don't think it has much impact on her BP.  However, to compensate, I am going to switch her losartan 100 mg for irbesartan 300 mg.  She is also to stop the isosorbide.  I believe it was prescribed with hydralazine as a generic BiDil, but with her continuing headaches, I would prefer that we avoid nitrates.  She is to call if the irbesartan is cost prohibitive and we can try another ARB. I think some of the positional orthostatic problems could be accentuated by her rather large dose of doxazosin (8 mg bid), so that is something I may adjust downward in the future as well. She is due to see Dr. Gwenlyn Found for follow up in March, and I will talk to her at that time.

## 2015-03-02 ENCOUNTER — Other Ambulatory Visit: Payer: Self-pay | Admitting: Cardiovascular Disease

## 2015-03-03 NOTE — Telephone Encounter (Signed)
Rx(s) sent to pharmacy electronically.  

## 2015-03-03 NOTE — Telephone Encounter (Signed)
Please review for refill, Thank you. 

## 2015-03-05 ENCOUNTER — Ambulatory Visit: Payer: BLUE CROSS/BLUE SHIELD | Admitting: Cardiovascular Disease

## 2015-03-18 ENCOUNTER — Encounter: Payer: Self-pay | Admitting: Internal Medicine

## 2015-03-18 ENCOUNTER — Ambulatory Visit (INDEPENDENT_AMBULATORY_CARE_PROVIDER_SITE_OTHER): Payer: BLUE CROSS/BLUE SHIELD | Admitting: Internal Medicine

## 2015-03-18 VITALS — BP 144/66 | HR 83 | Temp 98.5°F | Wt 207.0 lb

## 2015-03-18 DIAGNOSIS — R404 Transient alteration of awareness: Secondary | ICD-10-CM | POA: Diagnosis not present

## 2015-03-18 DIAGNOSIS — I10 Essential (primary) hypertension: Secondary | ICD-10-CM

## 2015-03-18 DIAGNOSIS — E785 Hyperlipidemia, unspecified: Secondary | ICD-10-CM

## 2015-03-18 DIAGNOSIS — G44011 Episodic cluster headache, intractable: Secondary | ICD-10-CM

## 2015-03-18 DIAGNOSIS — F450 Somatization disorder: Secondary | ICD-10-CM

## 2015-03-18 DIAGNOSIS — F329 Major depressive disorder, single episode, unspecified: Secondary | ICD-10-CM

## 2015-03-18 DIAGNOSIS — E1159 Type 2 diabetes mellitus with other circulatory complications: Secondary | ICD-10-CM

## 2015-03-18 DIAGNOSIS — R413 Other amnesia: Secondary | ICD-10-CM

## 2015-03-18 NOTE — Assessment & Plan Note (Signed)
LDL at goal on Pravachol Consume a low fat diet Baby ASA daily

## 2015-03-18 NOTE — Assessment & Plan Note (Signed)
No clear etiology Continue to follow with neurology

## 2015-03-18 NOTE — Progress Notes (Signed)
Pre visit review using our clinic review tool, if applicable. No additional management support is needed unless otherwise documented below in the visit note. 

## 2015-03-18 NOTE — Progress Notes (Signed)
Subjective:    Patient ID: Carrie Singh, female    DOB: 04-Sep-1955, 60 y.o.   MRN: [id number]  HPI  Pt presents to the clinic today followup ongoing medical condtions:  HTN: She is following with Dr. Rockey Situ for management of her resistant HTN. Last note from 01/01/15 reviewed. She has had a normal renal artery ultrasound, normal CT angiogram and normal stress test. She is on Chlorthalidone, Doxazosin, Hydralazine and Irbesartan. Her BP today is 144/66. She follows up with Dr. Rockey Situ, later this month.  Frequent Headaches and Altered Consciousness: She reports she is still having headaches 3-4 days per week. They are located on the left side of her head. She denies blurred vision or double vision. She does have mild dizziness. She has senstivity to light but not sound. She denies nausea or vomiting. She does get some relief with BC. She is on Zonegran but has not noticed that it has made a difference. She has seen Dr. Jaynee Eagles at [facility] for further evaluation. Note from 01/20/15 reviewed. Because things are not getting better and she is not able to perform her job duties, she will be filing for SSD.  Depression: She was recently started on Celexa by Dr. Jaynee Eagles at [facility]. She has not noticed any side effects, nor has she noticed any improvement in her depression. She reports her depression is triggered by her health status, she is out of work and having trouble paying her bills. She denies SI/HI.  DM 2: Her last A1C was 6.6% 10/17/14. She is taking Metformin as prescribed. She does not check her sugars. She denies hypoglycemic events. She does not take flu or pneumonia vaccines.   HLD: Her last LDL was 91. She is taking Pravastatin as prescribed. She denies muscle aches. She does try to consume a low fat diet. She is not taking a baby ASA daily.  Review of Systems      Past Medical History  Diagnosis Date  . Diabetes mellitus without complication ([place])   . Hypertension     resistant, negative renal  Dopplers and negative CT angiogram  . Hyperlipidemia   . Obesity   . Migraines   . Breast cancer, right breast ([place]) 12/2007    a. s/p chemo, radiation, and lumpectomy with negative sentinel lymph node biopsy   . Breast cancer [facility])     Current Outpatient Prescriptions  Medication Sig Dispense Refill  . chlorthalidone (HYGROTON) 25 MG tablet Take 1 tablet (25 mg total) by mouth daily. 30 tablet 1  . citalopram (CELEXA) 20 MG tablet Take 1 tablet (20 mg total) by mouth daily. 30 tablet 6  . doxazosin (CARDURA) 8 MG tablet Take 1 tablet (8 mg total) by mouth 2 (two) times daily. 60 tablet 11  . hydrALAZINE (APRESOLINE) 100 MG tablet Take 1 tablet (100 mg total) by mouth 3 (three) times daily. 270 tablet 2  . irbesartan (AVAPRO) 300 MG tablet Take 1 tablet (300 mg total) by mouth daily. 30 tablet 3  . metFORMIN (GLUCOPHAGE) 1000 MG tablet TAKE 1 TABLET BY MOUTH TWICE A DAY WITH A MEAL 60 tablet 5  . pravastatin (PRAVACHOL) 40 MG tablet TAKE 1 TABLET (40 MG TOTAL) BY MOUTH DAILY. 30 tablet 5  . zonisamide (ZONEGRAN) 100 MG capsule Take 1 capsule (100 mg total) by mouth daily. 30 capsule 11   No current facility-administered medications for this visit.    No Known Allergies  Family History  Problem Relation Age of Onset  . Stroke Mother   .  Heart disease Father   . Asthma Father   . Cancer Father     colon  . Cancer Sister     lung  . Cancer Brother     colon  . Diabetes Neg Hx     Social History   Social History  . Marital Status: Married    Spouse Name: [name]  . Number of Children: N/A  . Years of Education: 12+   Occupational History  . [employer]    Social History Main Topics  . Smoking status: Never Smoker   . Smokeless tobacco: Never Used  . Alcohol Use: No  . Drug Use: No  . Sexual Activity: Yes   Other Topics Concern  . Not on file   Social History Narrative   Lives at home with husband.   Caffeine use: 1-2 drinks per month         Epworth Sleepiness  Scale = 15 (as of 01/01/15)     Constitutional: Pt reports fatigue and headache. Denies fever, malaise, or abrupt weight changes.  HEENT: Denies eye pain, eye redness, ear pain, ringing in the ears, wax buildup, runny nose, nasal congestion, bloody nose, or sore throat. Respiratory: Denies difficulty breathing, shortness of breath, cough or sputum production.   Cardiovascular: Denies chest pain, chest tightness, palpitations or swelling in the hands or feet.  Skin: Denies redness, rashes, lesions or ulcercations.  Neurological: Pt reports difficulty with memory and difficulty with speech. Denies dizziness, or problems with balance and coordination.  Psych: Pt reports anxiety and depression. Denies SI/HI.  No other specific complaints in a complete review of systems (except as listed in HPI above).  Objective:   Physical Exam   BP 144/66 mmHg  Pulse 83  Temp(Src) 98.5 F (36.9 C) (Oral)  Wt 207 lb (93.895 kg)  SpO2 98% Wt Readings from Last 3 Encounters:  03/18/15 207 lb (93.895 kg)  02/27/15 207 lb 11.2 oz (94.212 kg)  02/06/15 210 lb 8 oz (95.482 kg)    General: Appears her stated age, well developed, well nourished in NAD. HEENT: Head: normal shape and size; Eyes: sclera white, no icterus, conjunctiva pink, PERRLA and EOMs intact;  Cardiovascular: Normal rate and rhythm. S1,S2 noted.  No murmur, rubs or gallops noted. No JVD or BLE edema. No carotid bruits noted. Pulmonary/Chest: Normal effort and positive vesicular breath sounds. No respiratory distress. No wheezes, rales or ronchi noted.  Neurological: Alert and oriented. Speech is slowed and jittery. Psychiatric: Mood and affect flat.  BMET    Component Value Date/Time   NA 138 02/20/2015 1105   NA 146* 01/20/2015 1457   NA 143 10/12/2013 1510   K 4.0 02/20/2015 1105   K 3.9 10/12/2013 1510   CL 101 02/20/2015 1105   CL 109* 10/12/2013 1510   CO2 31 02/20/2015 1105   CO2 29 10/12/2013 1510   GLUCOSE 138*  02/20/2015 1105   GLUCOSE 98 01/20/2015 1457   GLUCOSE 92 10/12/2013 1510   BUN 24* 02/20/2015 1105   BUN 17 01/20/2015 1457   BUN 16 10/12/2013 1510   CREATININE 1.13 02/20/2015 1105   CREATININE 1.07 10/12/2013 1510   CALCIUM 9.2 02/20/2015 1105   CALCIUM 9.4 10/12/2013 1510   GFRNONAA 54* 01/20/2015 1457   GFRNONAA 56* 10/12/2013 1510   GFRAA 63 01/20/2015 1457   GFRAA >60 10/12/2013 1510    Lipid Panel     Component Value Date/Time   CHOL 179 10/17/2014 1527   CHOL 186 04/03/2014 1101  TRIG 147 10/17/2014 1527   HDL 59 10/17/2014 1527   HDL 59.40 04/03/2014 1101   CHOLHDL 3.0 10/17/2014 1527   CHOLHDL 3 04/03/2014 1101   VLDL 28.0 04/03/2014 1101   LDLCALC 91 10/17/2014 1527   LDLCALC 99 04/03/2014 1101    CBC    Component Value Date/Time   WBC 7.8 10/20/2014 0500   WBC 4.9 10/12/2013 1510   WBC 4.2 08/13/2009 0946   RBC 4.37 10/20/2014 0500   RBC 4.64 10/12/2013 1510   RBC 4.24 08/13/2009 0946   HGB 12.8 10/20/2014 0500   HGB 13.5 10/12/2013 1510   HGB 12.8 08/13/2009 0946   HCT 37.2 10/20/2014 0500   HCT 40.5 10/12/2013 1510   HCT 36.8 08/13/2009 0946   PLT 212 10/20/2014 0500   PLT 278 10/12/2013 1510   PLT 271 08/13/2009 0946   MCV 85.2 10/20/2014 0500   MCV 87 10/12/2013 1510   MCV 87.0 08/13/2009 0946   MCH 29.3 10/20/2014 0500   MCH 29.1 10/12/2013 1510   MCH 30.2 08/13/2009 0946   MCHC 34.4 10/20/2014 0500   MCHC 33.3 10/12/2013 1510   MCHC 34.7 08/13/2009 0946   RDW 13.4 10/20/2014 0500   RDW 13.6 10/12/2013 1510   RDW 13.8 08/13/2009 0946   LYMPHSABS 2.1 10/11/2014 2343   LYMPHSABS 1.5 08/13/2009 0946   MONOABS 0.3 10/11/2014 2343   MONOABS 0.2 08/13/2009 0946   EOSABS 0.1 10/11/2014 2343   EOSABS 0.1 08/13/2009 0946   BASOSABS 0.0 10/11/2014 2343   BASOSABS 0.0 08/13/2009 0946    Hgb A1C Lab Results  Component Value Date   HGBA1C 6.6* 10/17/2014        Assessment & Plan:

## 2015-03-18 NOTE — Patient Instructions (Signed)
Mild Neurocognitive Disorder Mild neurocognitive disorder (formerly known as mild cognitive impairment) is a mental disorder. It is a slight abnormal decrease in mental function. The areas of mental function affected may include memory, thought, communication, behavior, and completion of tasks. The decrease is noticeable and measurable but for the most part does not interfere with your daily activities. Mild neurocognitive disorder typically occurs in people older than 60 years but can occur earlier. It is not as serious as major neurocognitive disorder (formerly known as dementia) but may lead to a more serious neurocognitive disorder. However, in some cases the condition does not get worse. A few people with this disorder even improve. CAUSES  There are a number of different causes of mild neurocognitive disorder:   Brain disorders associated with abnormal protein deposits, such as Alzheimer's disease, Pick's disease, and Lewy body disease.  Brain disorders associated with abnormal movement, such as Parkinson's disease and Huntington's disease.  Diseases affecting blood vessels in the brain and resulting in mini-strokes.  Certain infections, such as human immunodeficiency virus (HIV) infection.  Traumatic brain injury.  Other medical conditions such as brain tumors, underactive thyroid (hypothyroidism), and vitamin B12 deficiency.  Use of certain prescription medicine and "recreational" drugs. SYMPTOMS  Symptoms of mild neurocognitive disorder include:  Difficulty remembering. You may forget details of recent events, names, or phone numbers. You may forget important social events and appointments or repeatedly forget where you put your car keys.  Difficulty thinking and solving problems. You may have trouble with complex tasks such as paying bills or driving in unfamiliar locations.  Difficulty communicating. You may have trouble finding the right word, naming an object, forming a  sentence that makes sense, or understanding what you read or hear.  Changes in your behavior or personality. You may lose interest in the things that you used to enjoy or withdraw from social situations. You may get angry more easily than usual. You may act before thinking. You may do things in public that you would not usually do. You may hear or see things that are not real (hallucinations). You may believe falsely that others are trying to hurt you (paranoia). DIAGNOSIS Mild neurocognitive disorder is diagnosed through an assessment by your health care provider. Your health care provider will ask you and your family, friends, or coworkers questions about your symptoms. He or she will ask how often the symptoms occur, how long they have been occurring, whether they are getting worse, and the effect they are having on your life. Your health care provider may refer you to a neurologist or mental health specialist for a detailed evaluation of your mental functions (neuropsychological testing).  To identify the cause of your mild neurocognitive disorder, your health care provider may:  Obtain a detailed medical history.  Ask about alcohol and drug use, including prescription medicine.  Perform a physical exam.  Order blood tests and brain imaging exams. TREATMENT  Mild neurocognitive disorder caused by infections, use of certain medicines or "recreational" drugs, and certain medical conditions may improve with treatment of the condition that is causing the disorder. Mild neurocognitive disorder resulting from other causes generally does not improve and may worsen. In these cases, the goal of treatment is to slow progression of the disorder and help you cope with the loss of mental function. Treatments in these cases include:   Medicine. Medicine helps mainly with memory loss and behavioral symptoms.   Talk therapy. Talk therapy provides education, emotional support, memory aids, and other   ways of  making up for decreases in mental function.   Lifestyle changes. These include regular exercise, a healthy diet (including essential omega-3 fatty acids), intellectual stimulation, and increased social interaction.   This information is not intended to replace advice given to you by your health care provider. Make sure you discuss any questions you have with your health care provider.   Document Released: 09/05/2012 Document Revised: 01/24/2014 Document Reviewed: 09/05/2012 Elsevier Interactive Patient Education 2016 [employer]  

## 2015-03-18 NOTE — Assessment & Plan Note (Signed)
Continue Metformin Encouraged low fat, low carb diet

## 2015-03-18 NOTE — Assessment & Plan Note (Signed)
Will continue with Celexa for now She is not interested in going up on the dose at this time

## 2015-03-18 NOTE — Assessment & Plan Note (Signed)
Better controlled now Continue current regimen She will follow up with Dr. Rockey Situ this month

## 2015-03-18 NOTE — Assessment & Plan Note (Signed)
No real improvement She will continue to follow with neurology

## 2015-03-25 ENCOUNTER — Encounter: Payer: Self-pay | Admitting: Cardiovascular Disease

## 2015-03-28 ENCOUNTER — Emergency Department (HOSPITAL_COMMUNITY)
Admission: EM | Admit: 2015-03-28 | Discharge: 2015-03-28 | Disposition: A | Discharge status: Home / Self Care | Payer: BLUE CROSS/BLUE SHIELD | Attending: Emergency Medicine | Admitting: Emergency Medicine

## 2015-03-28 ENCOUNTER — Emergency Department (HOSPITAL_COMMUNITY): Payer: BLUE CROSS/BLUE SHIELD

## 2015-03-28 ENCOUNTER — Encounter (HOSPITAL_COMMUNITY): Payer: Self-pay

## 2015-03-28 DIAGNOSIS — Z7982 Long term (current) use of aspirin: Secondary | ICD-10-CM | POA: Diagnosis not present

## 2015-03-28 DIAGNOSIS — R5383 Other fatigue: Secondary | ICD-10-CM

## 2015-03-28 DIAGNOSIS — R55 Syncope and collapse: Secondary | ICD-10-CM | POA: Diagnosis not present

## 2015-03-28 DIAGNOSIS — E785 Hyperlipidemia, unspecified: Secondary | ICD-10-CM | POA: Diagnosis not present

## 2015-03-28 DIAGNOSIS — I10 Essential (primary) hypertension: Secondary | ICD-10-CM | POA: Insufficient documentation

## 2015-03-28 DIAGNOSIS — Z7984 Long term (current) use of oral hypoglycemic drugs: Secondary | ICD-10-CM | POA: Insufficient documentation

## 2015-03-28 DIAGNOSIS — R531 Weakness: Secondary | ICD-10-CM | POA: Insufficient documentation

## 2015-03-28 DIAGNOSIS — Z79899 Other long term (current) drug therapy: Secondary | ICD-10-CM | POA: Diagnosis not present

## 2015-03-28 DIAGNOSIS — G43909 Migraine, unspecified, not intractable, without status migrainosus: Secondary | ICD-10-CM | POA: Insufficient documentation

## 2015-03-28 DIAGNOSIS — Z853 Personal history of malignant neoplasm of breast: Secondary | ICD-10-CM | POA: Diagnosis not present

## 2015-03-28 DIAGNOSIS — E669 Obesity, unspecified: Secondary | ICD-10-CM | POA: Insufficient documentation

## 2015-03-28 DIAGNOSIS — E119 Type 2 diabetes mellitus without complications: Secondary | ICD-10-CM | POA: Diagnosis not present

## 2015-03-28 DIAGNOSIS — R42 Dizziness and giddiness: Secondary | ICD-10-CM | POA: Diagnosis not present

## 2015-03-28 LAB — BASIC METABOLIC PANEL
Anion gap: 16 — ABNORMAL HIGH (ref 5–15)
BUN: 18 mg/dL (ref 6–20)
CO2: 23 mmol/L (ref 22–32)
Calcium: 9 mg/dL (ref 8.9–10.3)
Chloride: 99 mmol/L — ABNORMAL LOW (ref 101–111)
Creatinine, Ser: 1.36 mg/dL — ABNORMAL HIGH (ref 0.44–1.00)
GFR calc Af Amer: 48 mL/min — ABNORMAL LOW (ref >60)
GFR calc non Af Amer: 41 mL/min — ABNORMAL LOW (ref >60)
Glucose, Bld: 135 mg/dL — ABNORMAL HIGH (ref 65–99)
Potassium: 3.2 mmol/L — ABNORMAL LOW (ref 3.5–5.1)
Sodium: 138 mmol/L (ref 135–145)

## 2015-03-28 LAB — CBC
HCT: 35 % — ABNORMAL LOW (ref 36.0–46.0)
Hemoglobin: 11.7 g/dL — ABNORMAL LOW (ref 12.0–15.0)
MCH: 28.6 pg (ref 26.0–34.0)
MCHC: 33.4 g/dL (ref 30.0–36.0)
MCV: 85.6 fL (ref 78.0–100.0)
Platelets: 213 10*3/uL (ref 150–400)
RBC: 4.09 MIL/uL (ref 3.87–5.11)
RDW: 13.3 % (ref 11.5–15.5)
WBC: 4.5 10*3/uL (ref 4.0–10.5)

## 2015-03-28 LAB — DIFFERENTIAL
Basophils Absolute: 0 10*3/uL (ref 0.0–0.1)
Basophils Relative: 1 %
Eosinophils Absolute: 0.1 10*3/uL (ref 0.0–0.7)
Eosinophils Relative: 1 %
Lymphocytes Relative: 28 %
Lymphs Abs: 1.6 10*3/uL (ref 0.7–4.0)
Monocytes Absolute: 0.4 10*3/uL (ref 0.1–1.0)
Monocytes Relative: 7 %
Neutro Abs: 3.8 10*3/uL (ref 1.7–7.7)
Neutrophils Relative %: 63 %

## 2015-03-28 LAB — URINALYSIS, ROUTINE W REFLEX MICROSCOPIC
Bilirubin Urine: NEGATIVE
Glucose, UA: NEGATIVE mg/dL
Hgb urine dipstick: NEGATIVE
Ketones, ur: NEGATIVE mg/dL
Leukocytes, UA: NEGATIVE
Nitrite: NEGATIVE
Protein, ur: NEGATIVE mg/dL
Specific Gravity, Urine: 1.006 (ref 1.005–1.030)
pH: 6 (ref 5.0–8.0)

## 2015-03-28 LAB — HEPATIC FUNCTION PANEL
ALT: 18 U/L (ref 14–54)
AST: 21 U/L (ref 15–41)
Albumin: 4 g/dL (ref 3.5–5.0)
Alkaline Phosphatase: 59 U/L (ref 38–126)
Bilirubin, Direct: 0.2 mg/dL (ref 0.1–0.5)
Indirect Bilirubin: 0.6 mg/dL (ref 0.3–0.9)
Total Bilirubin: 0.8 mg/dL (ref 0.3–1.2)
Total Protein: 6.7 g/dL (ref 6.5–8.1)

## 2015-03-28 LAB — I-STAT TROPONIN, ED: Troponin i, poc: 0.03 ng/mL (ref 0.00–0.08)

## 2015-03-28 LAB — TSH: TSH: 2.065 u[IU]/mL (ref 0.350–4.500)

## 2015-03-28 LAB — T4, FREE: Free T4: 0.89 ng/dL (ref 0.61–1.12)

## 2015-03-28 MED ORDER — SODIUM CHLORIDE 0.9 % IV BOLUS (SEPSIS)
1000.0000 mL | Freq: Once | INTRAVENOUS | Status: AC
  Administered 2015-03-28: 1000 mL via INTRAVENOUS

## 2015-03-28 MED ORDER — POTASSIUM CHLORIDE CRYS ER 20 MEQ PO TBCR
30.0000 meq | EXTENDED_RELEASE_TABLET | Freq: Two times a day (BID) | ORAL | Status: DC
  Administered 2015-03-28: 30 meq via ORAL
  Filled 2015-03-28: qty 2

## 2015-03-28 NOTE — ED Provider Notes (Signed)
CSN: [id number]     Arrival date & time 03/28/15  1501 History   First MD Initiated Contact with Patient 03/28/15 1504     Chief Complaint  Patient presents with  . Near Syncope     (Consider location/radiation/quality/duration/timing/severity/associated sxs/prior Treatment) HPI 60 year old Female who presents with generalized weakness and episode of near-syncope today. History of hypertension, hyperlipidemia, and diabetes. States that she has had a 5 month history of progressive generalized weakness and fatigue. This has been in the setting of elevated blood pressures, which she states her doctors have finally had control of recently. States that she has been worked up by cardiology including having a negative stress test within this period of time. States that her doctor has continued to be working her up for this, but they have not find a cause of this. Denies any significant weight loss or weight gain, decreased appetite, melena or hematochezia, abdominal pains, nausea vomiting or diarrhea, recent infections, fevers, night sweats, chest pain or difficulty breathing. Today while showering states that she felt very weak and lightheaded. States that she leaned against the wall of her bathroom until the feeling passed. Did not have syncope, palpitations, N/V, chest pain, SOB. No edema, orthopnea, PND.   Past Medical History  Diagnosis Date  . Diabetes mellitus without complication ([place])   . Hypertension     resistant, negative renal Dopplers and negative CT angiogram  . Hyperlipidemia   . Obesity   . Migraines   . Breast cancer, right breast ([place]) 12/2007    a. s/p chemo, radiation, and lumpectomy with negative sentinel lymph node biopsy   . Breast cancer [facility])    Past Surgical History  Procedure Laterality Date  . Abdominal hysterectomy    . Vocal cord nodules    . Cyst excision    . Ovary tumor    . Breast lumpectomy    . Foot surgery     Family History  Problem Relation Age of  Onset  . Stroke Mother   . Heart disease Father   . Asthma Father   . Cancer Father     colon  . Cancer Sister     lung  . Cancer Brother     colon  . Diabetes Neg Hx    Social History  Substance Use Topics  . Smoking status: Never Smoker   . Smokeless tobacco: Never Used  . Alcohol Use: No   OB History    No data available     Review of Systems 10/14 systems reviewed and are negative other than those stated in the HPI    Allergies  Review of patient's allergies indicates no known allergies.  Home Medications   Prior to Admission medications   Medication Sig Start Date End Date Taking? Authorizing Provider  Aspirin-Salicylamide-Caffeine (BC HEADACHE POWDER PO) Take 1 packet by mouth daily as needed (for headache).   Yes Historical Provider, MD  chlorthalidone (HYGROTON) 25 MG tablet Take 1 tablet (25 mg total) by mouth daily. 02/05/15  Yes Lorretta Harp, MD  citalopram (CELEXA) 20 MG tablet Take 1 tablet (20 mg total) by mouth daily. 01/20/15  Yes Melvenia Beam, MD  doxazosin (CARDURA) 8 MG tablet Take 1 tablet (8 mg total) by mouth 2 (two) times daily. 12/08/14  Yes Minna Merritts, MD  hydrALAZINE (APRESOLINE) 100 MG tablet Take 1 tablet (100 mg total) by mouth 3 (three) times daily. 03/03/15  Yes Lorretta Harp, MD  irbesartan (AVAPRO) 300  MG tablet Take 1 tablet (300 mg total) by mouth daily. 02/26/15  Yes Lorretta Harp, MD  metFORMIN (GLUCOPHAGE) 1000 MG tablet TAKE 1 TABLET BY MOUTH TWICE A DAY WITH A MEAL 07/30/14  Yes Jearld Fenton, NP  pravastatin (PRAVACHOL) 40 MG tablet TAKE 1 TABLET (40 MG TOTAL) BY MOUTH DAILY. 11/04/14  Yes Jearld Fenton, NP  zonisamide (ZONEGRAN) 100 MG capsule Take 1 capsule (100 mg total) by mouth daily. 12/26/14  Yes Melvenia Beam, MD   BP 164/100 mmHg  Pulse 64  Temp(Src) 98.7 F (37.1 C) (Oral)  Resp 20  Ht 5\' 5"  (1.651 m)  Wt 190 lb (86.183 kg)  BMI 31.62 kg/m2  SpO2 100% Physical Exam Physical Exam  Nursing note and  vitals reviewed. Constitutional: Well developed, well nourished, non-toxic, and in no acute distress Head: Normocephalic and atraumatic.  Mouth/Throat: Oropharynx is clear and moist.  Neck: Normal range of motion. Neck supple.  Cardiovascular: Normal rate and regular rhythm.   Pulmonary/Chest: Effort normal and breath sounds normal.  Abdominal: Soft. There is no tenderness. There is no rebound and no guarding.  Musculoskeletal: Normal range of motion.  Neurological: Alert, no facial droop, fluent speech, moves all extremities symmetrically Skin: Skin is warm and dry.  Psychiatric: Cooperative  ED Course  Procedures (including critical care time) Labs Review Labs Reviewed  BASIC METABOLIC PANEL - Abnormal; Notable for the following:    Potassium 3.2 (*)    Chloride 99 (*)    Glucose, Bld 135 (*)    Creatinine, Ser 1.36 (*)    GFR calc non Af Amer 41 (*)    GFR calc Af Amer 48 (*)    Anion gap 16 (*)    All other components within normal limits  CBC - Abnormal; Notable for the following:    Hemoglobin 11.7 (*)    HCT 35.0 (*)    All other components within normal limits  URINALYSIS, ROUTINE W REFLEX MICROSCOPIC (NOT AT [facility]) - Abnormal; Notable for the following:    Color, Urine AMBER (*)    All other components within normal limits  DIFFERENTIAL  TSH  T4, FREE  HEPATIC FUNCTION PANEL  I-STAT TROPOININ, ED    Imaging Review Dg Chest 2 View  03/28/2015  CLINICAL DATA:  60 year old female with sudden onset of weakness and fatigue this morning. EXAM: CHEST  2 VIEW COMPARISON:  Chest x-ray 09/05/2013. FINDINGS: Lung volumes are normal. No consolidative airspace disease. No pleural effusions. No pneumothorax. No pulmonary nodule or mass noted. Pulmonary vasculature and the cardiomediastinal silhouette are within normal limits. Atherosclerosis in the thoracic aorta. IMPRESSION: No radiographic evidence of acute cardiopulmonary disease. Electronically Signed   By: Vinnie Langton  M.D.   On: 03/28/2015 16:34   I have personally reviewed and evaluated these images and lab results as part of my medical decision-making.   EKG Interpretation   Date/Time:  Saturday March 28 2015 15:24:36 EST Ventricular Rate:  69 PR Interval:  179 QRS Duration: 160 QT Interval:  453 QTC Calculation: 485 R Axis:   -41 Text Interpretation:  Sinus rhythm RBBB and LAFB Probable left ventricular  hypertrophy No significant change since last tracing Confirmed by Vrishank Moster MD,  Melody Cirrincione [id number]) on 03/28/2015 5:23:38 PM      MDM   Final diagnoses:  Lightheadedness  Weakness  Other fatigue   60 year old female who presents with weakness and lightheadedness today. She is nontoxic and in no acute distress with normal  vital signs. Her EKG shows no acute ischemic changes and no stigmata of arrhythmia. Her exam is overall unremarkable. On chart review, she has had ongoing extensive work-up with her PCP for weakness and orthostatic lightheadedness. She has also had cardiac work-up with ECHO and stress test during this time. No evidence of emergent or life-threatening process identified today. Blood work overall unremarkable and no evidence of underlying infection. Discussed with patient results, and at this time I feel that she is safe for discharge home with continued outpatient workup of her symptoms. Strict return and follow-up instructions are reviewed. She expressed understanding of all discharge instructions, and felt comfortable with the plan of care.   Forde Dandy, MD 03/29/15 0111

## 2015-03-28 NOTE — Discharge Instructions (Signed)
Please continue to have your symptoms worked up by your primary care physician. We did not find anything serious today. Return for worsening symptoms, including fevers, chest pain, difficulty breathing, passing out, or any other symptoms concerning to you.  Fatigue Fatigue is feeling tired all of the time, a lack of energy, or a lack of motivation. Occasional or mild fatigue is often a normal response to activity or life in general. However, long-lasting (chronic) or extreme fatigue may indicate an underlying medical condition. HOME CARE INSTRUCTIONS  Watch your fatigue for any changes. The following actions may help to lessen any discomfort you are feeling:  Talk to your health care provider about how much sleep you need each night. Try to get the required amount every night.  Take medicines only as directed by your health care provider.  Eat a healthy and nutritious diet. Ask your health care provider if you need help changing your diet.  Drink enough fluid to keep your urine clear or pale yellow.  Practice ways of relaxing, such as yoga, meditation, massage therapy, or acupuncture.  Exercise regularly.   Change situations that cause you stress. Try to keep your work and personal routine reasonable.  Do not abuse illegal drugs.  Limit alcohol intake to no more than 1 drink per day for nonpregnant women and 2 drinks per day for men. One drink equals 12 ounces of beer, 5 ounces of wine, or 1 ounces of hard liquor.  Take a multivitamin, if directed by your health care provider. SEEK MEDICAL CARE IF:   Your fatigue does not get better.  You have a fever.   You have unintentional weight loss or gain.  You have headaches.   You have difficulty:   Falling asleep.  Sleeping throughout the night.  You feel angry, guilty, anxious, or sad.   You are unable to have a bowel movement (constipation).   You skin is dry.   Your legs or another part of your body is swollen.   SEEK IMMEDIATE MEDICAL CARE IF:   You feel confused.   Your vision is blurry.  You feel faint or pass out.   You have a severe headache.   You have severe abdominal, pelvic, or back pain.   You have chest pain, shortness of breath, or an irregular or fast heartbeat.   You are unable to urinate or you urinate less than normal.   You develop abnormal bleeding, such as bleeding from the rectum, vagina, nose, lungs, or nipples.  You vomit blood.   You have thoughts about harming yourself or committing suicide.   You are worried that you might harm someone else.    This information is not intended to replace advice given to you by your health care provider. Make sure you discuss any questions you have with your health care provider.   Document Released: 10/31/2006 Document Revised: 01/24/2014 Document Reviewed: 05/07/2013 Elsevier Interactive Patient Education [employer].

## 2015-03-28 NOTE — ED Notes (Signed)
Pt was at home in the shower when she had a sudden onset of fatigue and weakness.  Pt reports she felt as if she was going to pass out so she called EMS due to being home alone.  Pt reports she has had multiple episodes of the same over the past five months and has been seen for it multiple times.  No diagnosis has been made.

## 2015-04-07 ENCOUNTER — Encounter: Payer: Self-pay | Admitting: Internal Medicine

## 2015-04-07 ENCOUNTER — Ambulatory Visit: Payer: BLUE CROSS/BLUE SHIELD | Admitting: Cardiovascular Disease

## 2015-04-20 ENCOUNTER — Other Ambulatory Visit: Payer: Self-pay | Admitting: Cardiovascular Disease

## 2015-04-22 ENCOUNTER — Ambulatory Visit: Payer: BLUE CROSS/BLUE SHIELD | Admitting: Cardiovascular Disease

## 2015-04-22 NOTE — Telephone Encounter (Signed)
Rx(s) sent to pharmacy electronically.  

## 2015-05-06 ENCOUNTER — Ambulatory Visit (INDEPENDENT_AMBULATORY_CARE_PROVIDER_SITE_OTHER): Payer: BLUE CROSS/BLUE SHIELD | Admitting: Cardiovascular Disease

## 2015-05-06 ENCOUNTER — Encounter: Payer: Self-pay | Admitting: Cardiovascular Disease

## 2015-05-06 VITALS — BP 133/84 | HR 87 | Ht 65.0 in | Wt 214.2 lb

## 2015-05-06 DIAGNOSIS — I10 Essential (primary) hypertension: Secondary | ICD-10-CM

## 2015-05-06 MED ORDER — IRBESARTAN 150 MG PO TABS: 150.0000 mg | ORAL_TABLET | Freq: Every day | ORAL | Status: DC

## 2015-05-06 NOTE — Assessment & Plan Note (Signed)
Carrie Singh returns today for follow-up of her blood pressure. It's much better compared to what it was several months ago after titration of multiple medications although she does complain of some dizziness and excessive fatigue.her blood pressure today is 144/66. I am going to decrease her Avapro from 300 150 mg a day. Asked her to keep a blood pressure log over the next 30 days after which time she will see Carrie Singh in the office for follow-up.

## 2015-05-06 NOTE — Progress Notes (Signed)
05/06/2015 Carrie Singh   December 29, 1955  [id number]  Primary Physician Carrie Silversmith, NP Primary Cardiologist: Carrie Harp MD Carrie Singh   HPI:  Carrie Singh is a 60 year old moderately overweight married African-American female no children is accompanied today by her husband Carrie Singh. Unless her in the office 01/01/15. She has a history of treated hypertension, diabetes and hyperlipidemia. She has never had a heart attack or stroke. She has occasional chest pain but has had a negative Myoview stress test in the last several months. She is has had difficult to control hypertension for the last 4-6 months and isn't on multiple antihypertensive medications. Renal Dopplers performed 11/13/14 were unrevealing as was a CT angiogram performed 11/28/14.over the last several months we have been titrating her blood pressure medicines she is seen Carrie Singh as an outpatient. Her blood pressure is much better controlled recently and she does feel clinically improved although on occasion she complains of presyncope.   Current Outpatient Prescriptions  Medication Sig Dispense Refill  . Aspirin-Salicylamide-Caffeine (BC HEADACHE POWDER PO) Take 1 packet by mouth daily as needed (for headache). Reported on 05/06/2015    . chlorthalidone (HYGROTON) 25 MG tablet TAKE 1 TABLET (25 MG TOTAL) BY MOUTH DAILY. 30 tablet 8  . citalopram (CELEXA) 20 MG tablet Take 1 tablet (20 mg total) by mouth daily. 30 tablet 6  . doxazosin (CARDURA) 8 MG tablet Take 1 tablet (8 mg total) by mouth 2 (two) times daily. 60 tablet 11  . hydrALAZINE (APRESOLINE) 100 MG tablet Take 1 tablet (100 mg total) by mouth 3 (three) times daily. 270 tablet 2  . irbesartan (AVAPRO) 150 MG tablet Take 1 tablet (150 mg total) by mouth daily. 90 tablet 3  . metFORMIN (GLUCOPHAGE) 1000 MG tablet TAKE 1 TABLET BY MOUTH TWICE A DAY WITH A MEAL 60 tablet 5  . pravastatin (PRAVACHOL) 40 MG tablet TAKE 1 TABLET (40 MG TOTAL) BY MOUTH DAILY. 30  tablet 5  . zonisamide (ZONEGRAN) 100 MG capsule Take 1 capsule (100 mg total) by mouth daily. 30 capsule 11   No current facility-administered medications for this visit.    No Known Allergies  Social History   Social History  . Marital Status: Married    Spouse Name: Carrie Singh  . Number of Children: N/A  . Years of Education: 12+   Occupational History  . [employer]    Social History Main Topics  . Smoking status: Never Smoker   . Smokeless tobacco: Never Used  . Alcohol Use: No  . Drug Use: No  . Sexual Activity: Yes   Other Topics Concern  . Not on file   Social History Narrative   Lives at home with husband.   Caffeine use: 1-2 drinks per month         Epworth Sleepiness Scale = 15 (as of 01/01/15)     Review of Systems: General: negative for chills, fever, night sweats or weight changes.  Cardiovascular: negative for chest pain, dyspnea on exertion, edema, orthopnea, palpitations, paroxysmal nocturnal dyspnea or shortness of breath Dermatological: negative for rash Respiratory: negative for cough or wheezing Urologic: negative for hematuria Abdominal: negative for nausea, vomiting, diarrhea, bright red blood per rectum, melena, or hematemesis Neurologic: negative for visual changes, syncope, or dizziness All other systems reviewed and are otherwise negative except as noted above.    Blood pressure 133/84, pulse 87, height 5\' 5"  (1.651 m), weight 214 lb 3.2 oz (97.16 kg).  General appearance: alert and no distress  Neck: no adenopathy, no carotid bruit, no JVD, supple, symmetrical, trachea midline and thyroid not enlarged, symmetric, no tenderness/mass/nodules Lungs: clear to auscultation bilaterally Heart: regular rate and rhythm, S1, S2 normal, no murmur, click, rub or gallop Extremities: extremities normal, atraumatic, no cyanosis or edema  EKG not performed today  ASSESSMENT AND PLAN:   HTN (hypertension) Carrie Singh returns today for follow-up of her blood  pressure. It's much better compared to what it was several months ago after titration of multiple medications although she does complain of some dizziness and excessive fatigue.her blood pressure today is 144/66. I am going to decrease her Avapro from 300 150 mg a day. Asked her to keep a blood pressure log over the next 30 days after which time she will see Carrie Singh in the office for follow-up.      Carrie Harp MD FACP,FACC,FAHA, [facility] 05/06/2015 10:44 AM

## 2015-05-06 NOTE — Patient Instructions (Addendum)
Medication Instructions:  Your physician has recommended you make the following change in your medication:  1) DECREASE Avapro to 150 mg by mouth ONCE daily   Labwork: none  Testing/Procedures: none  Follow-Up: Your physician recommends that you schedule a follow-up appointment in: 4 weeks with [name]  We request that you follow-up in: 6 months with an extender and in 12 months with Dr Andria Rhein will receive a reminder letter in the mail two months in advance. If you don't receive a letter, please call our office to schedule the follow-up appointment.    Any Other Special Instructions Will Be Listed Below (If Applicable).  Dr. Gwenlyn Found would like you to check your blood pressure DAILY for the next 4 weeks.  Keep a journal of these daily BP and heart rate reading and call our office with the results.     If you need a refill on your cardiac medications before your next appointment, please call your pharmacy.

## 2015-05-29 ENCOUNTER — Telehealth: Payer: Self-pay | Admitting: Internal Medicine

## 2015-05-29 NOTE — Telephone Encounter (Signed)
Done, given back to robin 

## 2015-05-29 NOTE — Telephone Encounter (Signed)
[employer] forms  In [name] IN box for review and signature

## 2015-06-01 ENCOUNTER — Telehealth: Payer: Self-pay | Admitting: Cardiovascular Disease

## 2015-06-01 NOTE — Telephone Encounter (Signed)
Received [employer] Attending Physician Statement -Disability from [facility] office for Dr Gwenlyn Found to review, complete and sign.  No AUTH/Pmt to [employer].  Sent to [employer] @ [facility] to sent letter packet.  Sent via [facility] on 06/01/15. lp

## 2015-06-01 NOTE — Telephone Encounter (Signed)
Paperwork faxed Pt aware Per pt copy mailed to her  Copy for pt Copy for scan Copy for file

## 2015-06-02 ENCOUNTER — Encounter: Payer: Self-pay | Admitting: Internal Medicine

## 2015-06-03 ENCOUNTER — Encounter: Payer: Self-pay | Admitting: Internal Medicine

## 2015-06-04 ENCOUNTER — Ambulatory Visit (INDEPENDENT_AMBULATORY_CARE_PROVIDER_SITE_OTHER): Payer: BLUE CROSS/BLUE SHIELD | Admitting: Pharmacist

## 2015-06-04 ENCOUNTER — Encounter: Payer: Self-pay | Admitting: Pharmacist

## 2015-06-04 ENCOUNTER — Telehealth: Payer: Self-pay

## 2015-06-04 VITALS — BP 124/88 | Wt 213.6 lb

## 2015-06-04 DIAGNOSIS — I10 Essential (primary) hypertension: Secondary | ICD-10-CM

## 2015-06-04 NOTE — Patient Instructions (Signed)
Return for a a follow up appointment in 1 month. Please call [phone] in 2 weeks to report your blood pressure readings.   Your blood pressure today is 124/88  Check your blood pressure at home daily (if able) and keep record of the readings.  Take your BP meds as follows: Start taking doxazosin 8mg  just once a day in the evening Start taking the hydralazine 100mg  twice daily consistently  Bring all of your meds, your BP cuff and your record of home blood pressures to your next appointment.  Exercise as you're able, try to walk approximately 30 minutes per day.  Keep salt intake to a minimum, especially watch canned and prepared boxed foods.  Eat more fresh fruits and vegetables and fewer canned items.  Avoid eating in fast food restaurants.    HOW TO TAKE YOUR BLOOD PRESSURE: . Rest 5 minutes before taking your blood pressure. .  Don't smoke or drink caffeinated beverages for at least 30 minutes before. . Take your blood pressure before (not after) you eat. . Sit comfortably with your back supported and both feet on the floor (don't cross your legs). . Elevate your arm to heart level on a table or a desk. . Use the proper sized cuff. It should fit smoothly and snugly around your bare upper arm. There should be enough room to slip a fingertip under the cuff. The bottom edge of the cuff should be 1 inch above the crease of the elbow. . Ideally, take 3 measurements at one sitting and record the average.

## 2015-06-04 NOTE — Assessment & Plan Note (Signed)
BP is at goal today in office, but home readings reveal periods of elevation likely secondary to skipped doses of blood pressure medications though she does not report any association. Since she has been feeling so poorly will decrease doxazosin to 8mg  once a day in the evening and have her take hydralazine 100mg  twice daily consistently. Call to report BP measurements in 2 weeks. Follow-up in BP clinic in 1 month.

## 2015-06-04 NOTE — Progress Notes (Signed)
Patient ID: Carrie Singh                 DOB: [birth year]                      MRN: [id number]     HPI: Carrie Singh is a 60 y.o. female referred by Dr. Gwenlyn Found to HTN clinic for follow-up. PMH includes hypertension, DM, hyperlipidemia, occasional chest pain with negative myoview. Renal dopplers in 2016 were unrevealing.   She states she feels extremely fatigued over the last few months. She also reports "shaking" and she rocked her legs continuously throughout our visit.   She reports delaying her medications if she has to go anywhere. On these days she frequently only takes one dose of the hydralazine and doxazosin. This occurs approximately 3-4 times a week. She does this because when she takes her medications she is too weak and tired to do anything except sleep. She has also done a trial without chlorthalidone and irbesartan and says she still feels the same.   Her BP today is 124/88 and she reports not taking her medications yet this morning.   Current HTN meds:  chlorthalidone 25mg  daily - morning Irbesartan 150mg  daily - morning Doxazosin 8mg  BID - reports that she frequently only takes one dose a day (about 3-4x/week) Hydralazine 100mg  TID - also frequently taking only one dose Previously tried:  Irbesartan 300mg  bystlic - stopped due to cost Losartan 100mg   BP goal: <140/90  Family History: father died from HF at 51.  Social History: no tobacco or alcohol, denies caffeine  Home BP readings:  This week have mostly been in 120s/80s with 1 [id number] systolic Last week readings were mostly elevated with 4 >150/100    Wt Readings from Last 3 Encounters:  05/06/15 214 lb 3.2 oz (97.16 kg)  03/28/15 190 lb (86.183 kg)  03/18/15 207 lb (93.895 kg)   BP Readings from Last 3 Encounters:  05/06/15 133/84  03/28/15 164/100  03/18/15 144/66   Pulse Readings from Last 3 Encounters:  05/06/15 87  03/28/15 64  03/18/15 83    Renal function: CrCl cannot be calculated (Unknown  ideal weight.).  Past Medical History  Diagnosis Date  . Diabetes mellitus without complication ([place])   . Hypertension     resistant, negative renal Dopplers and negative CT angiogram  . Hyperlipidemia   . Obesity   . Migraines   . Breast cancer, right breast ([place]) 12/2007    a. s/p chemo, radiation, and lumpectomy with negative sentinel lymph node biopsy   . Breast cancer [facility])     Current Outpatient Prescriptions on File Prior to Visit  Medication Sig Dispense Refill  . Aspirin-Salicylamide-Caffeine (BC HEADACHE POWDER PO) Take 1 packet by mouth daily as needed (for headache). Reported on 05/06/2015    . chlorthalidone (HYGROTON) 25 MG tablet TAKE 1 TABLET (25 MG TOTAL) BY MOUTH DAILY. 30 tablet 8  . citalopram (CELEXA) 20 MG tablet Take 1 tablet (20 mg total) by mouth daily. 30 tablet 6  . doxazosin (CARDURA) 8 MG tablet Take 1 tablet (8 mg total) by mouth 2 (two) times daily. 60 tablet 11  . hydrALAZINE (APRESOLINE) 100 MG tablet Take 1 tablet (100 mg total) by mouth 3 (three) times daily. 270 tablet 2  . irbesartan (AVAPRO) 150 MG tablet Take 1 tablet (150 mg total) by mouth daily. 90 tablet 3  . metFORMIN (GLUCOPHAGE) 1000 MG tablet TAKE 1 TABLET BY MOUTH TWICE A  DAY WITH A MEAL 60 tablet 5  . pravastatin (PRAVACHOL) 40 MG tablet TAKE 1 TABLET (40 MG TOTAL) BY MOUTH DAILY. 30 tablet 5  . zonisamide (ZONEGRAN) 100 MG capsule Take 1 capsule (100 mg total) by mouth daily. 30 capsule 11   No current facility-administered medications on file prior to visit.    No Known Allergies   Assessment/Plan: Hypertension: BP is at goal today in office, but home readings reveal periods of elevation likely secondary to skipped doses of blood pressure medications though she does not report any association. Since she has been feeling so poorly will decrease doxazosin to 8mg  once a day in the evening and have her take hydralazine 100mg  twice daily consistently. Call to report BP measurements in 2  weeks. Follow-up in BP clinic in 1 month.    Thank you, Lelan Pons. Patterson Hammersmith, [facility]  06/04/2015 9:40 AM

## 2015-06-04 NOTE — Telephone Encounter (Signed)
Pt in today seeing PharmD and mentioned she was waiting to hear about her disability paperwork from Dr. Gwenlyn Found.  File reviewed, seems as if paperwork was originally sent to [facility] office and was forwarded to Dr. Gwenlyn Found and is now at [facility].  Contacted pt aware that we have received it as of 06/01/2015 and we will contact her once it is all completed or if we have any qustions.  Pt verbalized understanding, no additional questions at this time.

## 2015-06-17 ENCOUNTER — Encounter: Payer: Self-pay | Admitting: Internal Medicine

## 2015-06-17 ENCOUNTER — Telehealth: Payer: Self-pay | Admitting: Cardiovascular Disease

## 2015-06-17 NOTE — Telephone Encounter (Signed)
Received [employer] Attending Physician Statement back from [facility] for Dr Gwenlyn Found to review, complete and sign.  Forms given to [facility] for Dr Gwenlyn Found to sign. lp

## 2015-06-18 ENCOUNTER — Encounter: Payer: Self-pay | Admitting: Internal Medicine

## 2015-06-18 ENCOUNTER — Telehealth: Payer: Self-pay | Admitting: Pharmacist

## 2015-06-18 NOTE — Telephone Encounter (Signed)
Returned call to patient about blood pressure.  Readings are as follows: 5/19 144/101  71 131/76  83 5/20 138/87  82 5/21 133/77 75 5/31 166/98  73  6/1 140/92  75  She states she has not been feeling any better since cutting back on doxazosin. Her pressures are essentially unchanged since my visit a few weeks ago despite decrease in dose. She does not necessarily think the hydralazine is what is causing her to feel so poorly, but she does state that she feels this way even if she stops all the other medications and remains on hydralazine. Given that her pressures have not increased with change in medication will do a trial with lower dose of hydralazine. Will have her take 50mg  twice a day and keep follow up with [place] in 2 weeks to see if this improves her symptoms.

## 2015-06-19 ENCOUNTER — Telehealth: Payer: Self-pay | Admitting: Internal Medicine

## 2015-06-19 NOTE — Telephone Encounter (Signed)
Paperwork faxed Pt aware Copy to file Copy to scan Copy mailed to pt

## 2015-06-19 NOTE — Telephone Encounter (Signed)
Done, given to [name] 

## 2015-06-19 NOTE — Telephone Encounter (Signed)
[employer] refaxed paperwork In [facility] in box for review and signature

## 2015-06-24 ENCOUNTER — Encounter: Payer: Self-pay | Admitting: Internal Medicine

## 2015-06-25 ENCOUNTER — Telehealth: Payer: Self-pay | Admitting: Cardiovascular Disease

## 2015-06-25 NOTE — Telephone Encounter (Signed)
Received signed FMLA/Disability Forms back from Dr Gwenlyn Found.  Patient asked forms faxed to [facility] and copy for her to pickup.  Faxed to [facility] 06/25/15 and prepared copy for pick up

## 2015-06-25 NOTE — Telephone Encounter (Signed)
Called patient to let her know the status of her Group Disability income claim forms from [place]. She said her PCP Dr Garnette Gunner had already "written" her out of work but she needed Dr Kennon Holter medical diagnosis. This is reflected in the paperwork. I let her know Dr Gwenlyn Found had said she had "no restrictions" from a cardiac standpoint. She was fine with that. She asked [place] to fax the paperwork to [facility] as well as to leave her a copy at the front desk for her to pick up. Papers returned to [place] in medical records.

## 2015-07-02 ENCOUNTER — Ambulatory Visit: Payer: BLUE CROSS/BLUE SHIELD

## 2015-07-07 ENCOUNTER — Encounter: Payer: Self-pay | Admitting: Pharmacist

## 2015-07-07 ENCOUNTER — Ambulatory Visit (INDEPENDENT_AMBULATORY_CARE_PROVIDER_SITE_OTHER): Payer: BLUE CROSS/BLUE SHIELD | Admitting: Pharmacist

## 2015-07-07 VITALS — BP 98/62 | Wt 212.2 lb

## 2015-07-07 DIAGNOSIS — I10 Essential (primary) hypertension: Secondary | ICD-10-CM | POA: Diagnosis not present

## 2015-07-07 MED ORDER — IRBESARTAN 300 MG PO TABS: 300.0000 mg | ORAL_TABLET | Freq: Every day | ORAL | Status: DC

## 2015-07-07 NOTE — Patient Instructions (Addendum)
Return for a a follow up appointment in   Your blood pressure today is 98/62   Check your blood pressure at home daily (if able) and keep record of the readings.  Take your BP meds as follows: Stop Doxazosin completely Increase Irbesartan to 150mg  daily in the evening   Bring all of your meds, your BP cuff and your record of home blood pressures to your next appointment.  Exercise as you're able, try to walk approximately 30 minutes per day.  Keep salt intake to a minimum, especially watch canned and prepared boxed foods.  Eat more fresh fruits and vegetables and fewer canned items.  Avoid eating in fast food restaurants.    HOW TO TAKE YOUR BLOOD PRESSURE: . Rest 5 minutes before taking your blood pressure. .  Don't smoke or drink caffeinated beverages for at least 30 minutes before. . Take your blood pressure before (not after) you eat. . Sit comfortably with your back supported and both feet on the floor (don't cross your legs). . Elevate your arm to heart level on a table or a desk. . Use the proper sized cuff. It should fit smoothly and snugly around your bare upper arm. There should be enough room to slip a fingertip under the cuff. The bottom edge of the cuff should be 1 inch above the crease of the elbow. . Ideally, take 3 measurements at one sitting and record the average.   DASH Eating Plan DASH stands for "Dietary Approaches to Stop Hypertension." The DASH eating plan is a healthy eating plan that has been shown to reduce high blood pressure (hypertension). Additional health benefits may include reducing the risk of type 2 diabetes mellitus, heart disease, and stroke. The DASH eating plan may also help with weight loss. WHAT DO I NEED TO KNOW ABOUT THE DASH EATING PLAN? For the DASH eating plan, you will follow these general guidelines:  Choose foods with a percent daily value for sodium of less than 5% (as listed on the food label).  Use salt-free seasonings or herbs instead  of table salt or sea salt.  Check with your health care provider or pharmacist before using salt substitutes.  Eat lower-sodium products, often labeled as "lower sodium" or "no salt added."  Eat fresh foods.  Eat more vegetables, fruits, and low-fat dairy products.  Choose whole grains. Look for the word "whole" as the first word in the ingredient list.  Choose fish and skinless chicken or [place] more often than red meat. Limit fish, poultry, and meat to 6 oz (170 g) each day.  Limit sweets, desserts, sugars, and sugary drinks.  Choose heart-healthy fats.  Limit cheese to 1 oz (28 g) per day.  Eat more home-cooked food and less restaurant, buffet, and fast food.  Limit fried foods.  Cook foods using methods other than frying.  Limit canned vegetables. If you do use them, rinse them well to decrease the sodium.  When eating at a restaurant, ask that your food be prepared with less salt, or no salt if possible. WHAT FOODS CAN I EAT? Seek help from a dietitian for individual calorie needs. Grains Whole grain or whole wheat bread. Brown rice. Whole grain or whole wheat pasta. Quinoa, bulgur, and whole grain cereals. Low-sodium cereals. Corn or whole wheat flour tortillas. Whole grain cornbread. Whole grain crackers. Low-sodium crackers. Vegetables Fresh or frozen vegetables (raw, steamed, roasted, or grilled). Low-sodium or reduced-sodium tomato and vegetable juices. Low-sodium or reduced-sodium tomato sauce and paste. Low-sodium  or reduced-sodium canned vegetables.  Fruits All fresh, canned (in natural juice), or frozen fruits. Meat and Other Protein Products Ground beef (85% or leaner), grass-fed beef, or beef trimmed of fat. Skinless chicken or [place]. Ground chicken or [place]. Pork trimmed of fat. All fish and seafood. Eggs. Dried beans, peas, or lentils. Unsalted nuts and seeds. Unsalted canned beans. Dairy Low-fat dairy products, such as skim or 1% milk, 2% or reduced-fat  cheeses, low-fat ricotta or cottage cheese, or plain low-fat yogurt. Low-sodium or reduced-sodium cheeses. Fats and Oils Tub margarines without trans fats. Light or reduced-fat mayonnaise and salad dressings (reduced sodium). Avocado. Safflower, olive, or canola oils. Natural peanut or almond butter. Other Unsalted popcorn and pretzels. The items listed above may not be a complete list of recommended foods or beverages. Contact your dietitian for more options. WHAT FOODS ARE NOT RECOMMENDED? Grains White bread. White pasta. White rice. Refined cornbread. Bagels and croissants. Crackers that contain trans fat. Vegetables Creamed or fried vegetables. Vegetables in a cheese sauce. Regular canned vegetables. Regular canned tomato sauce and paste. Regular tomato and vegetable juices. Fruits Dried fruits. Canned fruit in light or heavy syrup. Fruit juice. Meat and Other Protein Products Fatty cuts of meat. Ribs, chicken wings, bacon, sausage, bologna, salami, chitterlings, fatback, hot dogs, bratwurst, and packaged luncheon meats. Salted nuts and seeds. Canned beans with salt. Dairy Whole or 2% milk, cream, half-and-half, and cream cheese. Whole-fat or sweetened yogurt. Full-fat cheeses or blue cheese. Nondairy creamers and whipped toppings. Processed cheese, cheese spreads, or cheese curds. Condiments Onion and garlic salt, seasoned salt, table salt, and sea salt. Canned and packaged gravies. Worcestershire sauce. Tartar sauce. Barbecue sauce. Teriyaki sauce. Soy sauce, including reduced sodium. Steak sauce. Fish sauce. Oyster sauce. Cocktail sauce. Horseradish. Ketchup and mustard. Meat flavorings and tenderizers. Bouillon cubes. Hot sauce. Tabasco sauce. Marinades. Taco seasonings. Relishes. Fats and Oils Butter, stick margarine, lard, shortening, ghee, and bacon fat. Coconut, palm kernel, or palm oils. Regular salad dressings. Other Pickles and olives. Salted popcorn and pretzels. The items  listed above may not be a complete list of foods and beverages to avoid. Contact your dietitian for more information. WHERE CAN I FIND MORE INFORMATION? National Heart, Lung, and Blood Institute: travelstabloid.com   This information is not intended to replace advice given to you by your health care provider. Make sure you discuss any questions you have with your health care provider.   Document Released: 12/23/2010 Document Revised: 01/24/2014 Document Reviewed: 11/07/2012 Elsevier Interactive Patient Education [employer].

## 2015-07-07 NOTE — Progress Notes (Signed)
Patient ID: Carrie Singh                 DOB: [birth year]                      MRN: [id number]     Tamarra Mallett is a 60 y.o. female referred by Dr. Gwenlyn Found to HTN clinic for follow-up. PMH includes hypertension, DM, hyperlipidemia, occasional chest pain with negative myoview. Renal dopplers in 2016 were unrevealing.   She states she feels extremely fatigued over the last few months. She also reports "shaking" and she rocked her legs continuously throughout our visit.   She reports delaying her medications if she has to go anywhere. On these days she frequently only takes one dose of the hydralazine and doxazosin. This occurs approximately 3-4 times a week. She does this because when she takes her medications she is too weak and tired to do anything except sleep. She has also done a trial without chlorthalidone and irbesartan and says she still feels the same.   Today she reports that she has not seen much improvement in her fatigue. Her blood pressures remain essentially unchanged even after discontinuing medications. She reports that 90% of the readings from home are prior to taking her blood pressure medications. She does not usually check again after taking the medications.   Current HTN meds:  chlorthalidone 25mg  daily - morning Irbesartan 150mg  daily - morning Doxazosin 8mg  in the morning  Hydralazine 50mg  BID  Previously tried:  Irbesartan 300mg  - stopped due to low BP bystlic - stopped due to cost Losartan 100mg   BP goal: <140/90  Family History: father died from HF at 14.  Social History: no tobacco or alcohol, denies caffeine  Home BP readings:  5/16 were [id number] systolic Most of her readings were 140s/80-90s  Wt Readings from Last 3 Encounters:  07/07/15 212 lb 3.2 oz (96.253 kg)  06/04/15 213 lb 9.6 oz (96.888 kg)  05/06/15 214 lb 3.2 oz (97.16 kg)   BP Readings from Last 3 Encounters:  07/07/15 98/62  06/04/15 124/88  05/06/15 133/84   Pulse Readings from Last  3 Encounters:  05/06/15 87  03/28/15 64  03/18/15 83    Renal function: CrCl cannot be calculated (Patient has no serum creatinine result on file.).  Past Medical History  Diagnosis Date  . Diabetes mellitus without complication ([place])   . Hypertension     resistant, negative renal Dopplers and negative CT angiogram  . Hyperlipidemia   . Obesity   . Migraines   . Breast cancer, right breast ([place]) 12/2007    a. s/p chemo, radiation, and lumpectomy with negative sentinel lymph node biopsy   . Breast cancer [facility])     Current Outpatient Prescriptions on File Prior to Visit  Medication Sig Dispense Refill  . Aspirin-Salicylamide-Caffeine (BC HEADACHE POWDER PO) Take 1 packet by mouth daily as needed (for headache). Reported on 05/06/2015    . chlorthalidone (HYGROTON) 25 MG tablet TAKE 1 TABLET (25 MG TOTAL) BY MOUTH DAILY. 30 tablet 8  . citalopram (CELEXA) 20 MG tablet Take 1 tablet (20 mg total) by mouth daily. 30 tablet 6  . doxazosin (CARDURA) 8 MG tablet Take 1 tablet (8 mg total) by mouth 2 (two) times daily. (Patient taking differently: Take 8 mg by mouth daily. ) 60 tablet 11  . hydrALAZINE (APRESOLINE) 100 MG tablet Take 1 tablet (100 mg total) by mouth 3 (three) times daily. (Patient taking differently: Take 50  mg by mouth 2 (two) times daily. ) 270 tablet 2  . metFORMIN (GLUCOPHAGE) 1000 MG tablet TAKE 1 TABLET BY MOUTH TWICE A DAY WITH A MEAL 60 tablet 5  . pravastatin (PRAVACHOL) 40 MG tablet TAKE 1 TABLET (40 MG TOTAL) BY MOUTH DAILY. 30 tablet 5  . zonisamide (ZONEGRAN) 100 MG capsule Take 1 capsule (100 mg total) by mouth daily. (Patient not taking: Reported on 06/04/2015) 30 capsule 11   No current facility-administered medications on file prior to visit.    No Known Allergies   Assessment/Plan: Hypertension: BP is low today. She remains fatigued and feeling poorly. At this point I am uncertain if this is from the blood pressure medications or there may need to be  another cause that needs to be investigated. Doxazosin can be associated with lethargy so will do a trial off this medication to see if there is any improvement. Given her BP is low today and home readings were prior to medication will not increase or add another agent at this time. Have instructed her to check her blood pressure about 69minutes to an hour after take medications. Follow-up in blood pressure clinic in 1 month.  Thank you, Lelan Pons. [place], [place]

## 2015-07-14 ENCOUNTER — Telehealth: Payer: Self-pay | Admitting: *Deleted

## 2015-07-14 NOTE — Telephone Encounter (Signed)
Pt [employer] form on [employer].

## 2015-07-15 NOTE — Telephone Encounter (Signed)
LVM for pt to call office. Please inform pt that Dr Jaynee Eagles did receive disability paperwork but she cannot fill it out for her. She will have to go to her PCP or psychiatry for this. We are happy to provide office notes or results if needed. Gave [employer] phone number.

## 2015-07-17 NOTE — Telephone Encounter (Signed)
Rn call patient about [employer] disability only needed last office notes. PT was last seen 01/2015 with Dr.Ahern. Pt stated her PCP put her out of work and on disability. Rn stated per last note her PCP would have to fill out the disability form. Rn stated the release form attach and last office notes will be fax to [facility]. Pt verbalized understanding.

## 2015-07-17 NOTE — Telephone Encounter (Signed)
Pt called and says that disability just wants updated notes.

## 2015-07-17 NOTE — Telephone Encounter (Signed)
Last office notes from January 2017 fax to [employer] per patients request. Fax to [phone]. Fax receive and confirm.

## 2015-07-27 ENCOUNTER — Ambulatory Visit (INDEPENDENT_AMBULATORY_CARE_PROVIDER_SITE_OTHER): Payer: BLUE CROSS/BLUE SHIELD | Admitting: Neurology

## 2015-07-27 ENCOUNTER — Encounter: Payer: Self-pay | Admitting: Neurology

## 2015-07-27 VITALS — BP 149/89 | HR 72 | Ht 65.0 in | Wt 211.8 lb

## 2015-07-27 DIAGNOSIS — R413 Other amnesia: Secondary | ICD-10-CM

## 2015-07-27 DIAGNOSIS — R251 Tremor, unspecified: Secondary | ICD-10-CM

## 2015-07-27 DIAGNOSIS — F05 Delirium due to known physiological condition: Secondary | ICD-10-CM | POA: Diagnosis not present

## 2015-07-27 DIAGNOSIS — R410 Disorientation, unspecified: Secondary | ICD-10-CM

## 2015-07-27 NOTE — Progress Notes (Signed)
[facility]    Provider:  Dr Jaynee Eagles Referring Provider: Jearld Fenton, NP Primary Care Physician:  Webb Silversmith, NP   Interval history 07/27/2015: Here for follow up on headaches.  Headaches not as severe. Blood pressure is under control. Headaches are much better. She gets 3-4 headaches a month that's it and uses BC powder. Only taking it 3-4x a month the [facility] powder and it seems to help. I would prefer if she used advil or tylenol instead. The headaches are well controlled per patient and husband. Sheis having memory problems. Headaches improved, but the memory problems are not worsening but still significant. She gets the "shakes" . Mother with dementia, Alzheimers. Patient with a very strange affect, rubbing hher arm, rocking back and forth, but they deny any psychiatric problems. She sees a psychiatrist tomorrow but they are unsure why. Sleep evaluation has been negative. Patient blankly stares at me. She says she can;t remember names, numbers, phone number. She can;t remember even older memories as well. She recently had alteration of consciousness. She was taking a shower and she called 911 and she had confusion. Alteration of consciousness and went to the ED. Felt confused, going faint, fatigued. No falls. Discussed MRi of the brain and formal neurocognitive testing. Patient forgets the words to songs. Mother was about 81 when she started having memory problems.    Thyroid normal, B12 401.   CT head : 11/05/2014  FINDINGS: No evidence for acute infarction, hemorrhage, mass lesion, hydrocephalus, or extra-axial fluid. Normal cerebral volume. Mild cerebellar atrophy is suspected. No cortical atrophy. Slight hypoattenuation of white matter, suspected small vessel disease.  Calvarium intact. No sinus or mastoid disease. Negative visualized orbits.  IMPRESSION: Stable exam. No acute intracranial findings.  Interval History: Blood pressure is under control. She  feels very depressed. She is sad most days. She has thoughts she is going to die. She denies thoughts of hurting herself. Zonegran has helped. She can't get her thoughts and can't get her words together. That has been going on a while. Concentration has been poor. She has loss of memory. She cries in the office. She endorses significant mood problems. Husband here as well> she is not working, lot of stress in her life, the job is stressful. She is not performing well in her job. She can't her bills. Needs to follow up with primary care and psychiatry. Headaches are improved. Not as frequent. They can still be severe. She has them a few times a week.     HPI: Carrie Singh is a 60 y.o. female here as a referral from Dr. Garnette Gunner for headaches. She has a past medical history of hypertension, diabetes, high cholesterol, migraine, depression, breast cancer. Husband provides most information. She was tried on fioricet, topamax, imitrex and she failed all. The topamax made her "zombie like" and she was so tired she was dazed and found in the closet. Unclear if it was a medication effect. Husband brought her to the bathroom because she was confused and was laying on the floor in the closet but she was coherent and answering questions, patient says everything got black and felt dizzy. When husband brought her to the bathroom, she stopped wlking and her head fell on his shoulder and she sat on the commode. She was just sitting there, not totally limp, she did not fall, eyes were closed, husband tried to talk to patient and she said she could get up and walk and she walked to the bed and  layed down for a while and felt better. Patient remembers feeling tired and dizzy, she was on topamax and having side effects at that time and she was on other medication from the emergency room which was fioricet which also made her tired. The headaches started a month ago or longer. She snores, she feels very fatigued during the day, she  can fall asleep very easily during the day, she wakes up with headaches. She has headaches during the day. She used to have headaches very bad 15 years ago and they never found out why. The headaches are on the left side, continuous, pressure, the bright light bothers her a little but she doesn't need to go into a dark room, she has nausea occasionally, smells very much bother her. She has had occ. Aura of lines but only when the headaches are severe. They can be severe 10/10 pain, she went to the ED. She has some word and speaking difficulty with the headaches. She has had moments where she will be staring and she won't respond. She is having memory problems. No vision changes. She has depression which is untreated. She is falling asleep today in the office. Her blood pressure has been excessively elevated and they are seeing cardiology and increasing the cardura.   Reviewed notes, labs and imaging from outside physicians, which showed:   Ct showed No acute intracranial abnormalities including mass lesion or mass effect, hydrocephalus, extra-axial fluid collection, midline shift, hemorrhage, or acute infarction, large ischemic events. Possible some non-specific white matter changes. (personally reviewed images)  CMP with slightly elevated creatinine 1.06, elevated bilirubin 1.5, dec GFR 55 cbc nml  Review of Systems: Patient complains of symptoms per HPI as well as the following symptoms: Activity change, fatigue, shortness of breath, memory loss, dizziness, headache, speech difficulty, weakness, daytime sleepiness, walking difficulty, confusion and depression. Pertinent negatives per HPI. All others negative.   Social History   Social History  . Marital Status: Married    Spouse Name: [name]  . Number of Children: N/A  . Years of Education: 12+   Occupational History  . [employer]    Social History Main Topics  . Smoking status: Never Smoker   . Smokeless tobacco: Never Used  . Alcohol Use:  No  . Drug Use: No  . Sexual Activity: Yes   Other Topics Concern  . Not on file   Social History Narrative   Lives at home with husband.   Caffeine use: 1-2 drinks per month         Epworth Sleepiness Scale = 15 (as of 01/01/15)    Family History  Problem Relation Age of Onset  . Stroke Mother   . Heart disease Father   . Asthma Father   . Cancer Father     colon  . Cancer Sister     lung  . Cancer Brother     colon  . Diabetes Neg Hx     Past Medical History  Diagnosis Date  . Diabetes mellitus without complication ([place])   . Hypertension     resistant, negative renal Dopplers and negative CT angiogram  . Hyperlipidemia   . Obesity   . Migraines   . Breast cancer, right breast ([place]) 12/2007    a. s/p chemo, radiation, and lumpectomy with negative sentinel lymph node biopsy   . Breast cancer [facility])     Past Surgical History  Procedure Laterality Date  . Abdominal hysterectomy    . Vocal cord nodules    .  Cyst excision    . Ovary tumor    . Breast lumpectomy    . Foot surgery      Current Outpatient Prescriptions  Medication Sig Dispense Refill  . Aspirin-Salicylamide-Caffeine (BC HEADACHE POWDER PO) Take 1 packet by mouth daily as needed (for headache). Reported on 05/06/2015    . chlorthalidone (HYGROTON) 25 MG tablet TAKE 1 TABLET (25 MG TOTAL) BY MOUTH DAILY. 30 tablet 8  . citalopram (CELEXA) 20 MG tablet Take 1 tablet (20 mg total) by mouth daily. 30 tablet 6  . doxazosin (CARDURA) 8 MG tablet Take 1 tablet (8 mg total) by mouth 2 (two) times daily. (Patient taking differently: Take 8 mg by mouth daily. ) 60 tablet 11  . hydrALAZINE (APRESOLINE) 100 MG tablet Take 1 tablet (100 mg total) by mouth 3 (three) times daily. (Patient taking differently: Take 50 mg by mouth 2 (two) times daily. ) 270 tablet 2  . irbesartan (AVAPRO) 150 MG tablet Take 1 tablet (150 mg total) by mouth daily. 90 tablet 3  . metFORMIN (GLUCOPHAGE) 1000 MG tablet TAKE 1 TABLET BY  MOUTH TWICE A DAY WITH A MEAL 60 tablet 5  . pravastatin (PRAVACHOL) 40 MG tablet TAKE 1 TABLET (40 MG TOTAL) BY MOUTH DAILY. 30 tablet 5   No current facility-administered medications for this visit.    Allergies as of 07/27/2015  . (No Known Allergies)    Vitals: BP 149/89 mmHg  Pulse 72  Ht 5\' 5"  (1.651 m)  Wt 211 lb 12.8 oz (96.072 kg)  BMI 35.25 kg/m2 Last Weight:  Wt Readings from Last 1 Encounters:  07/27/15 211 lb 12.8 oz (96.072 kg)   Last Height:   Ht Readings from Last 1 Encounters:  07/27/15 5\' 5"  (1.651 m)     Physical exam: Exam: Gen: Flat affect, rocking back and forth, akathisia  Neuro: Detailed Neurologic Exam  Speech:    Speech is normal; fluent and spontaneous with normal comprehension.  Cognition:    The patient is oriented to person, place, and time;  Cranial Nerves:    The pupils are equal, round, and reactive to light.  Visual fields are full to finger confrontation. Extraocular movements are intact. Trigeminal sensation is intact and the muscles of mastication are normal. The face is symmetric. The palate elevates in the midline. Hearing intact. Voice is normal. Shoulder shrug is normal. The tongue has normal motion without fasciculations.     Motor Observation:    No asymmetry, no atrophy, and no involuntary movements noted. Tone:    Normal muscle tone.    Posture:    Posture is normal. normal erect    Strength:    Strength is V/V in the upper and lower limbs.        Assessment/Plan: 60 year old female here for follow-up of headaches which have improved. She has a past medical history of hypertension, diabetes, high cholesterol, migraine, depression, breast cancer. Patient's headaches are improved with blood pressure management. She has memory changes. They deny any psychiatric issues but patient's behavior and affect is unusual, she is rubbing her arm, rocking back and forth in her chair, often with a blank stare on her face during the  appointment but responds appropriately.Tested her for sleep apnea last year which was negative. Husband feels like it was a big ordeal the last year with medical problems and blood pressure and maybe this is th cause of her perceived memory problems.    Suggest MRI of the brain w/wo contrast  for memory changes, subacute confusion B12 and TSh nml Suggest formal neurocognitive testing for her memory.- she says she is getting a 3 hour test tomorrow but she does not know if it is neurocognitive testing or not. Asked husband to call and let us know and we can order it if it has not been ordered Patient denies psychiatric problems but then said she has depression. They deny any psychiatric issues but patient's behavior and affect is unusual, she is rubbing her arm, rocking back and forth in her chair, often with a blank stare on her face during the appointment but responds appropriately.  I wonder if complaints are of psychological etiology however need to be evaluated for degenerative neurocognitive disorder with mri of the brain, formal neurocognitive testing and then possibly FDG brain PET scan if warranted after this evaluation.   I have asked husband to return after he has the report from [facility], or to call us if  this is not neurocognitive testing so that I can order Sarina Ill, MD  [facility] [address] [place], [place] [address]  Phone [phone] Fax [phone]  A total of 30 minutes was spent face-to-face with this patient. Over half this time was spent on counseling patient on the memory changes and headache diagnosis and different diagnostic and therapeutic options available.

## 2015-07-27 NOTE — Patient Instructions (Signed)
Remember to drink plenty of fluid, eat healthy meals and do not skip any meals. Try to eat protein with a every meal and eat a healthy snack such as fruit or nuts in between meals. Try to keep a regular sleep-wake schedule and try to exercise daily, particularly in the form of walking, 20-30 minutes a day, if you can.   As far as diagnostic testing: MRI brain w/wo ocntrast  I would like to see you back after neurocognitive testing please bring report with you, sooner if we need to. Please call us with any interim questions, concerns, problems, updates or refill requests.   Our phone number is [phone]. We also have an after hours call service for urgent matters and there is a physician on-call for urgent questions. For any emergencies you know to call 911 or go to the nearest emergency room

## 2015-07-29 ENCOUNTER — Other Ambulatory Visit: Payer: Self-pay

## 2015-07-29 MED ORDER — PRAVASTATIN SODIUM 40 MG PO TABS: 40.0000 mg | ORAL_TABLET | Freq: Every day | ORAL | Status: DC

## 2015-07-30 ENCOUNTER — Telehealth: Payer: Self-pay | Admitting: Neurology

## 2015-07-30 NOTE — Telephone Encounter (Signed)
Hilda Blades- I sent back to MR today. Can you look out for this and make sure records are requested? Thank you!

## 2015-07-30 NOTE — Telephone Encounter (Signed)
Dr Jaynee Eagles has signed release form from pt on 07/27/15. We were waiting to hear back from pt/husband as to who to request records from.   Gave signed release form to MR to request office note.

## 2015-07-30 NOTE — Telephone Encounter (Signed)
Pt's husband called sts pt saws Jake E Ricketson/[facility]  (p) [phone] this week. He called per Dr Jaynee Eagles request so OV could be obtained.

## 2015-07-31 NOTE — Telephone Encounter (Signed)
I receive pt release. I will faxed to Jacinto Halim today office open at 11:00.

## 2015-08-05 ENCOUNTER — Telehealth: Payer: Self-pay | Admitting: *Deleted

## 2015-08-05 NOTE — Telephone Encounter (Signed)
Left message unable to reach pt. Pt need to sign a release form for disability determination services for records. Per Dr. Pershing Proud this is a contract patient, office not allowed to send any records out.

## 2015-08-12 ENCOUNTER — Telehealth: Payer: Self-pay | Admitting: *Deleted

## 2015-08-12 ENCOUNTER — Telehealth: Payer: Self-pay | Admitting: Neurology

## 2015-08-12 NOTE — Telephone Encounter (Signed)
Pt's husband called back about MRI scheduling, please call

## 2015-08-12 NOTE — Telephone Encounter (Signed)
Patient and husband came into office and we spoke. They requested to have the MRI sent to [facility]. I told them I would send it.

## 2015-08-12 NOTE — Telephone Encounter (Signed)
Andee Poles- can you call husband back? Looks like you tried calling twice in the past to schedule. Thank you

## 2015-08-12 NOTE — Telephone Encounter (Signed)
Pt signed a release form for  [facility] to release her  records to  [facility].

## 2015-08-18 ENCOUNTER — Encounter: Payer: Self-pay | Admitting: Pharmacist

## 2015-08-18 ENCOUNTER — Ambulatory Visit (INDEPENDENT_AMBULATORY_CARE_PROVIDER_SITE_OTHER): Payer: BLUE CROSS/BLUE SHIELD | Admitting: Pharmacist

## 2015-08-18 VITALS — BP 138/84 | HR 85 | Wt 213.6 lb

## 2015-08-18 DIAGNOSIS — I10 Essential (primary) hypertension: Secondary | ICD-10-CM | POA: Diagnosis not present

## 2015-08-18 DIAGNOSIS — R413 Other amnesia: Secondary | ICD-10-CM | POA: Diagnosis not present

## 2015-08-18 DIAGNOSIS — R251 Tremor, unspecified: Secondary | ICD-10-CM | POA: Diagnosis not present

## 2015-08-18 DIAGNOSIS — F05 Delirium due to known physiological condition: Secondary | ICD-10-CM | POA: Diagnosis not present

## 2015-08-18 NOTE — Patient Instructions (Addendum)
Return for a a follow up appointment in 3 weeks  Check your blood pressure at home daily (if able) and keep record of the readings.  Take your BP meds as follows: Increase Irbesartan back to 300mg  once daily in the evening (you can take 2 of the 150mg  tablets until you run out)  Bring all of your meds, your BP cuff and your record of home blood pressures to your next appointment.  Exercise as you're able, try to walk approximately 30 minutes per day.  Keep salt intake to a minimum, especially watch canned and prepared boxed foods.  Eat more fresh fruits and vegetables and fewer canned items.  Avoid eating in fast food restaurants.    HOW TO TAKE YOUR BLOOD PRESSURE: . Rest 5 minutes before taking your blood pressure. .  Don't smoke or drink caffeinated beverages for at least 30 minutes before. . Take your blood pressure before (not after) you eat. . Sit comfortably with your back supported and both feet on the floor (don't cross your legs). . Elevate your arm to heart level on a table or a desk. . Use the proper sized cuff. It should fit smoothly and snugly around your bare upper arm. There should be enough room to slip a fingertip under the cuff. The bottom edge of the cuff should be 1 inch above the crease of the elbow. . Ideally, take 3 measurements at one sitting and record the average.

## 2015-08-18 NOTE — Progress Notes (Signed)
Patient ID: Carrie Singh                 DOB: 1955-03-29                      MRN: [id number]     HPI: Carrie Singh is a 59 y.o. female patient of Dr. Gwenlyn Singh with [facility] who presents today for hypertension folow-up. She has been seen several times in hypertension clinic to titrate/taper her blood pressure medications. She reports that she is feeling much better than our last visit after stopping the doxazosin. Her energy level has improved though she still feels she is not back at baseline.   She still occasionally delays her medications if she has somewhere to go but she is better able to tolerate her medications. She does report having a few more headaches recently, requiring her to use more BC powder.   She reports that she believes she is having mini seizures relatively frequently. Her sister who works with patients that have seizure disorders has witnessed her periods of shaking and told her she believed they were seizures. She was evaluated by neurology but did not mention this to them. They are doing an MRI today for her memory loss issues. I instructed her to tell them about her shaking and that she believed they may be seizures. She stated she would follow up with them about this issue.   Cardiac Hx: hypertension, DM, hyperlipidemia, occasional chest pain with negative myoview. Renal dopplers in 2016 were unrevealing.   Current HTN meds:  chlorthalidone 25mg  daily - morning Irbesartan 150mg  daily - evening Hydralazine 50mg  BID  Previously tried:  Irbesartan 300mg  - stopped due to low BP bystlic - stopped due to cost Losartan 100mg   BP goal: <140/90  Family History: father died from HF at 66.  Social History: no tobacco or alcohol, denies caffeine  Home BP readings: since 6/21 she has had 5 readings [id number] systolic and 8 [id number] diastolic. Overall her pressures typically run in the 140s/80s. She has had no systolic pressures less than 100.   Her cuff was previously evaluated  and Singh to be within acceptable limits per her report.   Wt Readings from Last 3 Encounters:  08/18/15 213 lb 9.6 oz (96.9 kg)  07/27/15 211 lb 12.8 oz (96.1 kg)  07/07/15 212 lb 3.2 oz (96.3 kg)   BP Readings from Last 3 Encounters:  08/18/15 138/84  07/27/15 (!) 149/89  07/07/15 98/62   Pulse Readings from Last 3 Encounters:  08/18/15 85  07/27/15 72  05/06/15 87    Renal function: CrCl cannot be calculated (Patient's most recent lab result is older than the maximum 21 days allowed.).  Past Medical History:  Diagnosis Date  . Breast cancer ([place])   . Breast cancer, right breast ([place]) 12/2007   a. s/p chemo, radiation, and lumpectomy with negative sentinel lymph node biopsy   . Diabetes mellitus without complication ([place])   . Hyperlipidemia   . Hypertension    resistant, negative renal Dopplers and negative CT angiogram  . Migraines   . Obesity     Current Outpatient Prescriptions on File Prior to Visit  Medication Sig Dispense Refill  . Aspirin-Salicylamide-Caffeine (BC HEADACHE POWDER PO) Take 1 packet by mouth daily as needed (for headache). Reported on 05/06/2015    . chlorthalidone (HYGROTON) 25 MG tablet TAKE 1 TABLET (25 MG TOTAL) BY MOUTH DAILY. 30 tablet 8  . citalopram (CELEXA) 20 MG tablet Take  1 tablet (20 mg total) by mouth daily. 30 tablet 6  . hydrALAZINE (APRESOLINE) 100 MG tablet Take 1 tablet (100 mg total) by mouth 3 (three) times daily. (Patient taking differently: Take 50 mg by mouth 2 (two) times daily. ) 270 tablet 2  . irbesartan (AVAPRO) 150 MG tablet Take 1 tablet (150 mg total) by mouth daily. 90 tablet 3  . metFORMIN (GLUCOPHAGE) 1000 MG tablet TAKE 1 TABLET BY MOUTH TWICE A DAY WITH A MEAL 60 tablet 5  . pravastatin (PRAVACHOL) 40 MG tablet Take 1 tablet (40 mg total) by mouth daily. 30 tablet 5   No current facility-administered medications on file prior to visit.     No Known Allergies  Blood pressure 138/84, pulse 85, weight 213 lb 9.6  oz (96.9 kg).   Assessment/Plan: Hypertension: BP is at goal in office today; however her home monitoring reveals elevated blood pressure readings. Will have her increase her irbesartan back to 300mg  rather than restarting doxazosin to hopefully avoid the drowsiness. Will see her back in 3 weeks to recheck blood pressure and get a BMET after increasing her irbesartan dose.    Thank you, Carrie Singh, [facility]  08/18/2015 10:29 AM

## 2015-08-24 ENCOUNTER — Encounter: Payer: Self-pay | Admitting: Internal Medicine

## 2015-09-08 ENCOUNTER — Ambulatory Visit (INDEPENDENT_AMBULATORY_CARE_PROVIDER_SITE_OTHER): Payer: BLUE CROSS/BLUE SHIELD | Admitting: Internal Medicine

## 2015-09-08 ENCOUNTER — Ambulatory Visit: Payer: BLUE CROSS/BLUE SHIELD

## 2015-09-08 ENCOUNTER — Encounter: Payer: Self-pay | Admitting: Internal Medicine

## 2015-09-08 VITALS — BP 128/82 | HR 81 | Temp 98.1°F | Ht 65.33 in | Wt 214.0 lb

## 2015-09-08 DIAGNOSIS — Z0001 Encounter for general adult medical examination with abnormal findings: Secondary | ICD-10-CM

## 2015-09-08 DIAGNOSIS — Z1159 Encounter for screening for other viral diseases: Secondary | ICD-10-CM

## 2015-09-08 DIAGNOSIS — Z0000 Encounter for general adult medical examination without abnormal findings: Secondary | ICD-10-CM

## 2015-09-08 LAB — COMPREHENSIVE METABOLIC PANEL
ALT: 13 U/L (ref 0–35)
AST: 14 U/L (ref 0–37)
Albumin: 4.1 g/dL (ref 3.5–5.2)
Alkaline Phosphatase: 56 U/L (ref 39–117)
BUN: 18 mg/dL (ref 6–23)
CO2: 27 mEq/L (ref 19–32)
Calcium: 9 mg/dL (ref 8.4–10.5)
Chloride: 105 mEq/L (ref 96–112)
Creatinine, Ser: 1.09 mg/dL (ref 0.40–1.20)
GFR: 65.34 mL/min (ref >60.00)
Glucose, Bld: 116 mg/dL — ABNORMAL HIGH (ref 70–99)
Potassium: 3.7 mEq/L (ref 3.5–5.1)
Sodium: 140 mEq/L (ref 135–145)
Total Bilirubin: 0.9 mg/dL (ref 0.2–1.2)
Total Protein: 7 g/dL (ref 6.0–8.3)

## 2015-09-08 LAB — CBC
HCT: 36 % (ref 36.0–46.0)
Hemoglobin: 12.4 g/dL (ref 12.0–15.0)
MCHC: 34.3 g/dL (ref 30.0–36.0)
MCV: 86.2 fl (ref 78.0–100.0)
Platelets: 263 10*3/uL (ref 150.0–400.0)
RBC: 4.18 Mil/uL (ref 3.87–5.11)
RDW: 13.3 % (ref 11.5–15.5)
WBC: 6 10*3/uL (ref 4.0–10.5)

## 2015-09-08 LAB — LIPID PANEL
Cholesterol: 170 mg/dL (ref 0–200)
HDL: 62.4 mg/dL (ref >39.00)
LDL Cholesterol: 85 mg/dL (ref 0–99)
NonHDL: 107.16
Total CHOL/HDL Ratio: 3
Triglycerides: 111 mg/dL (ref 0.0–149.0)
VLDL: 22.2 mg/dL (ref 0.0–40.0)

## 2015-09-08 LAB — HEMOGLOBIN A1C: Hgb A1c MFr Bld: 6.4 % (ref 4.6–6.5)

## 2015-09-08 NOTE — Progress Notes (Addendum)
Subjective:    Patient ID: Carrie Singh, female    DOB: 03-09-1955, 60 y.o.   MRN: [id number]  HPI  Pt presents to the clinic today for her annual exam.  Flu: declines Tetanus: 12/2008 Pneumovax: declines Zostovax: not sure that she had chicken pox. Pap Smear: Total Hysterectomy Mammogram: 12/2014 Colon Screening: 2013 at [place]: yearly at [facility] Dentist: annually  Diet: She does eat meat. She consumes fruits and veggies daily. She does eat some fried foods. She drinks mostly water. Exercise: None  Review of Systems      Past Medical History:  Diagnosis Date  . Breast cancer ([place])   . Breast cancer, right breast ([place]) 12/2007   a. s/p chemo, radiation, and lumpectomy with negative sentinel lymph node biopsy   . Diabetes mellitus without complication ([place])   . Hyperlipidemia   . Hypertension    resistant, negative renal Dopplers and negative CT angiogram  . Migraines   . Obesity     Current Outpatient Prescriptions  Medication Sig Dispense Refill  . Aspirin-Salicylamide-Caffeine (BC HEADACHE POWDER PO) Take 1 packet by mouth daily as needed (for headache). Reported on 05/06/2015    . chlorthalidone (HYGROTON) 25 MG tablet TAKE 1 TABLET (25 MG TOTAL) BY MOUTH DAILY. 30 tablet 8  . citalopram (CELEXA) 20 MG tablet Take 1 tablet (20 mg total) by mouth daily. 30 tablet 6  . hydrALAZINE (APRESOLINE) 100 MG tablet Take 1 tablet (100 mg total) by mouth 3 (three) times daily. (Patient taking differently: Take 50 mg by mouth 2 (two) times daily. ) 270 tablet 2  . irbesartan (AVAPRO) 300 MG tablet Take 0.5 tablets (150 mg total) by mouth daily.    . metFORMIN (GLUCOPHAGE) 1000 MG tablet TAKE 1 TABLET BY MOUTH TWICE A DAY WITH A MEAL 60 tablet 5  . pravastatin (PRAVACHOL) 40 MG tablet Take 1 tablet (40 mg total) by mouth daily. 30 tablet 5   No current facility-administered medications for this visit.     No Known Allergies  Family  History  Problem Relation Age of Onset  . Stroke Mother   . Heart disease Father   . Asthma Father   . Cancer Father     colon  . Cancer Sister     lung  . Cancer Brother     colon  . Diabetes Neg Hx     Social History   Social History  . Marital status: Married    Spouse name: [name]  . Number of children: N/A  . Years of education: 12+   Occupational History  . [employer]    Social History Main Topics  . Smoking status: Never Smoker  . Smokeless tobacco: Never Used  . Alcohol use No  . Drug use: No  . Sexual activity: Yes   Other Topics Concern  . Not on file   Social History Narrative   Lives at home with husband.   Caffeine use: 1-2 drinks per month         Epworth Sleepiness Scale = 15 (as of 01/01/15)     Constitutional: Denies fever, malaise, fatigue, headache or abrupt weight changes.  HEENT: Denies eye pain, eye redness, ear pain, ringing in the ears, wax buildup, runny nose, nasal congestion, bloody nose, or sore throat. Respiratory: Denies difficulty breathing, shortness of breath, cough or sputum production.   Cardiovascular: Pt reports swelling in the feet. Denies chest pain, chest tightness, palpitations or swelling in the hands.  Gastrointestinal: Denies abdominal pain, bloating, constipation, diarrhea or blood in the stool.  GU: Denies urgency, frequency, pain with urination, burning sensation, blood in urine, odor or discharge. Musculoskeletal: Denies decrease in range of motion, difficulty with gait, muscle pain or joint pain and swelling.  Skin: Denies redness, rashes, lesions or ulcercations.  Neurological: Pt reports difficulty with memory. Denies dizziness, difficulty with speech or problems with balance and coordination.  Psych: Denies anxiety, depression, SI/HI.  No other specific complaints in a complete review of systems (except as listed in HPI above).  Objective:   Physical Exam  BP 128/82   Pulse 81   Temp 98.1 F (36.7 C) (Oral)    Ht 5' 5.33" (1.659 m)   Wt 214 lb (97.1 kg)   SpO2 98%   BMI 35.25 kg/m   Wt Readings from Last 3 Encounters:  08/18/15 213 lb 9.6 oz (96.9 kg)  07/27/15 211 lb 12.8 oz (96.1 kg)  07/07/15 212 lb 3.2 oz (96.3 kg)    General: Appears her stated age, obese in NAD. Skin: Warm, dry and intact. No rashes, lesions or ulcerations noted. HEENT: Head: normal shape and size; Eyes: sclera white, no icterus, conjunctiva pink, PERRLA and EOMs intact; Ears: Tm's gray and intact, normal light reflex; Throat/Mouth: Teeth present, mucosa pink and moist, no exudate, lesions or ulcerations noted.  Neck: Neck supple, trachea midline. No masses, lumps or thyromegaly present.  Cardiovascular: Normal rate and rhythm. S1,S2 noted.  No murmur, rubs or gallops noted. Trace BLE edema. No carotid bruits noted. Pulmonary/Chest: Normal effort and positive vesicular breath sounds. No respiratory distress. No wheezes, rales or ronchi noted.  Abdomen: Soft and nontender. Normal bowel sounds. No distention or masses noted. Liver, spleen and kidneys non palpable. Musculoskeletal: Normal range of motion. Strength 5/5 BUE/BLE. No difficulty with gait.  Neurological: Alert and oriented. Cranial nerves II-XII grossly intact. Coordination normal.  Psychiatric: Mood and affect normal. Behavior is normal. Judgment and thought content normal.     BMET    Component Value Date/Time   NA 138 03/28/2015 1534   NA 146 (H) 01/20/2015 1457   NA 143 10/12/2013 1510   K 3.2 (L) 03/28/2015 1534   K 3.9 10/12/2013 1510   CL 99 (L) 03/28/2015 1534   CL 109 (H) 10/12/2013 1510   CO2 23 03/28/2015 1534   CO2 29 10/12/2013 1510   GLUCOSE 135 (H) 03/28/2015 1534   GLUCOSE 92 10/12/2013 1510   BUN 18 03/28/2015 1534   BUN 17 01/20/2015 1457   BUN 16 10/12/2013 1510   CREATININE 1.36 (H) 03/28/2015 1534   CREATININE 1.07 10/12/2013 1510   CALCIUM 9.0 03/28/2015 1534   CALCIUM 9.4 10/12/2013 1510   GFRNONAA 41 (L) 03/28/2015 1534    GFRNONAA 56 (L) 10/12/2013 1510   GFRAA 48 (L) 03/28/2015 1534   GFRAA >60 10/12/2013 1510    Lipid Panel     Component Value Date/Time   CHOL 179 10/17/2014 1527   TRIG 147 10/17/2014 1527   HDL 59 10/17/2014 1527   CHOLHDL 3.0 10/17/2014 1527   CHOLHDL 3 04/03/2014 1101   VLDL 28.0 04/03/2014 1101   LDLCALC 91 10/17/2014 1527    CBC    Component Value Date/Time   WBC 4.5 03/28/2015 1534   RBC 4.09 03/28/2015 1534   HGB 11.7 (L) 03/28/2015 1534   HGB 13.5 10/12/2013 1510   HGB 12.8 08/13/2009 0946   HCT 35.0 (L) 03/28/2015 1534   HCT 40.5 10/12/2013 1510  HCT 36.8 08/13/2009 0946   PLT 213 03/28/2015 1534   PLT 278 10/12/2013 1510   PLT 271 08/13/2009 0946   MCV 85.6 03/28/2015 1534   MCV 87 10/12/2013 1510   MCV 87.0 08/13/2009 0946   MCH 28.6 03/28/2015 1534   MCHC 33.4 03/28/2015 1534   RDW 13.3 03/28/2015 1534   RDW 13.6 10/12/2013 1510   RDW 13.8 08/13/2009 0946   LYMPHSABS 1.6 03/28/2015 1700   LYMPHSABS 1.5 08/13/2009 0946   MONOABS 0.4 03/28/2015 1700   MONOABS 0.2 08/13/2009 0946   EOSABS 0.1 03/28/2015 1700   EOSABS 0.1 08/13/2009 0946   BASOSABS 0.0 03/28/2015 1700   BASOSABS 0.0 08/13/2009 0946    Hgb A1C Lab Results  Component Value Date   HGBA1C 6.6 (H) 10/17/2014        Assessment & Plan:   Preventative Health Maintenance:  She declines flu, pneumovax and zostovax Tetanus UTD She declines pap smears Mammogram due 12/2015, she will call to schedule Colonoscopy UTD Encouraged her to work on diet and exercise Encouraged her to get see her eye doctor and dentist annually Will check CBC, CMET, Lipid, A1C and Hep C  Today  RTC in 1 year for annual exam/follow up chronic conditions, sooner if needed Webb Silversmith, NP

## 2015-09-08 NOTE — Patient Instructions (Signed)

## 2015-09-09 LAB — HEPATITIS C ANTIBODY: HCV Ab: NEGATIVE

## 2015-09-11 ENCOUNTER — Ambulatory Visit (INDEPENDENT_AMBULATORY_CARE_PROVIDER_SITE_OTHER): Payer: Self-pay

## 2015-09-11 DIAGNOSIS — Z0289 Encounter for other administrative examinations: Secondary | ICD-10-CM

## 2015-09-11 DIAGNOSIS — R251 Tremor, unspecified: Secondary | ICD-10-CM

## 2015-09-11 DIAGNOSIS — F05 Delirium due to known physiological condition: Principal | ICD-10-CM

## 2015-09-11 DIAGNOSIS — R413 Other amnesia: Secondary | ICD-10-CM

## 2015-09-11 DIAGNOSIS — R410 Disorientation, unspecified: Secondary | ICD-10-CM

## 2015-09-15 ENCOUNTER — Telehealth: Payer: Self-pay | Admitting: *Deleted

## 2015-09-15 NOTE — Telephone Encounter (Signed)
-----   Message from Melvenia Beam, MD sent at 09/13/2015  6:20 PM EDT ----- MRi o fthe brain normal for age. She has some mild white matter changes, non specific and likely due to aging but should watch vascular risk factors such as HTN, glucose, cholesterol thanks

## 2015-09-15 NOTE — Telephone Encounter (Signed)
LVM for pt about MRI results per Dr Jaynee Eagles note. Gave [facility] phone number if she has further questions/concerns.

## 2015-09-28 ENCOUNTER — Other Ambulatory Visit: Payer: Self-pay | Admitting: Neurology

## 2015-09-28 NOTE — Telephone Encounter (Signed)
Dr Jaynee Eagles- in your office note, you mentioned following up with PCP for titration of medication. Not sure if you wanted her to get further refills from PCP? Please advise

## 2015-10-02 ENCOUNTER — Other Ambulatory Visit: Payer: Self-pay | Admitting: Internal Medicine

## 2015-10-05 ENCOUNTER — Telehealth: Payer: Self-pay | Admitting: *Deleted

## 2015-10-05 DIAGNOSIS — Z79899 Other long term (current) drug therapy: Secondary | ICD-10-CM

## 2015-10-05 MED ORDER — IRBESARTAN 300 MG PO TABS: 300.0000 mg | ORAL_TABLET | Freq: Every day | ORAL | 3 refills | Status: DC

## 2015-10-05 NOTE — Telephone Encounter (Signed)
Assessment/Plan: Hypertension: BP is at goal in office today; however her home monitoring reveals elevated blood pressure readings. Will have her increase her irbesartan back to 300mg  rather than restarting doxazosin to hopefully avoid the drowsiness. Will see her back in 3 weeks to recheck blood pressure and get a BMET after increasing her irbesartan dose.    Thank you, Lelan Pons. Patterson Hammersmith, [facility]  08/18/2015 10:29 AM  AVS Reports   Date/Time Report Action User  08/18/2015 10:07 AM After Visit Summary Printed Erskine Emery, [facility]  Patient Instructions   Return for a a follow up appointment in 3 weeks  Check your blood pressure at home daily (if able) and keep record of the readings.  Take your BP meds as follows: Increase Irbesartan back to 300mg  once daily in the evening (you can take 2 of the 150mg  tablets until you run out)

## 2015-10-05 NOTE — Telephone Encounter (Signed)
Spoke with patient-verified patient is taking 300mg  irbesartan daily per OV note on 8/1 with clinical pharmacist.  New rx sent to pharmacy electronically.  Also advised she needs BMET since increased dose (per OV note)-order placed and patient verbalized she will complete prior to f/u with [facility] on 9/26.

## 2015-10-13 ENCOUNTER — Ambulatory Visit (INDEPENDENT_AMBULATORY_CARE_PROVIDER_SITE_OTHER): Payer: BLUE CROSS/BLUE SHIELD | Admitting: Pharmacist

## 2015-10-13 VITALS — BP 108/62 | HR 98

## 2015-10-13 DIAGNOSIS — I10 Essential (primary) hypertension: Secondary | ICD-10-CM | POA: Diagnosis not present

## 2015-10-13 LAB — BASIC METABOLIC PANEL
BUN: 24 mg/dL (ref 7–25)
CO2: 29 mmol/L (ref 20–31)
Calcium: 9.6 mg/dL (ref 8.6–10.4)
Chloride: 107 mmol/L (ref 98–110)
Creat: 1.15 mg/dL — ABNORMAL HIGH (ref 0.50–0.99)
Glucose, Bld: 135 mg/dL — ABNORMAL HIGH (ref 65–99)
Potassium: 4.2 mmol/L (ref 3.5–5.3)
Sodium: 143 mmol/L (ref 135–146)

## 2015-10-13 MED ORDER — HYDRALAZINE HCL 100 MG PO TABS: 100.0000 mg | ORAL_TABLET | Freq: Two times a day (BID) | ORAL | Status: DC

## 2015-10-13 MED ORDER — IRBESARTAN 150 MG PO TABS: 150.0000 mg | ORAL_TABLET | Freq: Two times a day (BID) | ORAL | 2 refills | Status: DC

## 2015-10-13 NOTE — Patient Instructions (Addendum)
Return for a follow up appointment with Physicians Assistant as recommended.   Check your blood pressure at home daily (if able) and keep record of the readings.  Take your BP meds as follows: Continue irbesartan 150mg  (1/2 tablet) twice daily Hydralazine 50mg  twice daily  Move chlorthalidone 25mg  daily to the morning to help with going to the restroom at night.    Bring all of your meds, your BP cuff and your record of home blood pressures to your next appointment.  Exercise as you're able, try to walk approximately 30 minutes per day.  Keep salt intake to a minimum, especially watch canned and prepared boxed foods.  Eat more fresh fruits and vegetables and fewer canned items.  Avoid eating in fast food restaurants.    HOW TO TAKE YOUR BLOOD PRESSURE: . Rest 5 minutes before taking your blood pressure. .  Don't smoke or drink caffeinated beverages for at least 30 minutes before. . Take your blood pressure before (not after) you eat. . Sit comfortably with your back supported and both feet on the floor (don't cross your legs). . Elevate your arm to heart level on a table or a desk. . Use the proper sized cuff. It should fit smoothly and snugly around your bare upper arm. There should be enough room to slip a fingertip under the cuff. The bottom edge of the cuff should be 1 inch above the crease of the elbow. . Ideally, take 3 measurements at one sitting and record the average.

## 2015-10-13 NOTE — Progress Notes (Signed)
Patient ID: Carrie Singh                 DOB: [birth year]                      MRN: [id number]     HPI: Carrie Singh is a 60 y.o. female patient of Dr. Gwenlyn Singh with [place] [facility] who presents today for hypertension follow up. She has been seen several times to titrate/taper blood pressure medications due to side effects and uncontrolled hypertension.   She reports that her lethargy is about the same. She has been taking irbesartan 150mg  twice daily rather than taking 300mg  at once because this seems to help with her symptoms. She also feels that her pressures have been better controlled on her current regimen.   She states she has had more headaches recently though she is not necessarily sure this is because her pressure is high.   Will be without prescription coverage until November 1st.   Patient called back to report that she is taking Hydralazine 100mg  twice daily and NOT 50mg .   Cardiac Hx: hypertension, DM, hyperlipidemia, occasional chest pain with negative myoview. Renal dopplers in 2016 were unrevealing.   Current HTN meds:  chlorthalidone 25mg  daily - evening - 6-7pm Irbesartan 150mg  BID - am dose 8am and 6-7pm Hydralazine 100mg  BID - am dose 8am and 6-7pm  Previously tried:  Irbesartan 300mg  - stopped due to low BP bystolic - stopped due to cost Doxazosin - lethargy  BP goal: <140/90  Family History: father died from HF at 82.  Social History: no tobacco or alcohol, denies caffeine  Home BP readings:  She states her cuff was previously evaluated and Singh to be within acceptable limits.  Home readings avg mid 130s/70s with no measurements >150 and 4 measurements 0000000 systolic. All diastolics WNL.    Wt Readings from Last 3 Encounters:  09/08/15 214 lb (97.1 kg)  08/18/15 213 lb 9.6 oz (96.9 kg)  07/27/15 211 lb 12.8 oz (96.1 kg)   BP Readings from Last 3 Encounters:  10/13/15 108/62  09/08/15 128/82  08/18/15 138/84   Pulse Readings from Last 3 Encounters:   10/13/15 98  09/08/15 81  08/18/15 85    Renal function: CrCl cannot be calculated (Unknown ideal weight.).  Past Medical History:  Diagnosis Date  . Breast cancer ([place])   . Breast cancer, right breast ([place]) 12/2007   a. s/p chemo, radiation, and lumpectomy with negative sentinel lymph node biopsy   . Diabetes mellitus without complication ([place])   . Hyperlipidemia   . Hypertension    resistant, negative renal Dopplers and negative CT angiogram  . Migraines   . Obesity     Current Outpatient Prescriptions on File Prior to Visit  Medication Sig Dispense Refill  . Aspirin-Salicylamide-Caffeine (BC HEADACHE POWDER PO) Take 1 packet by mouth daily as needed (for headache). Reported on 05/06/2015    . chlorthalidone (HYGROTON) 25 MG tablet TAKE 1 TABLET (25 MG TOTAL) BY MOUTH DAILY. 30 tablet 8  . citalopram (CELEXA) 20 MG tablet TAKE 1 TABLET (20 MG TOTAL) BY MOUTH DAILY. 30 tablet 6  . hydrALAZINE (APRESOLINE) 100 MG tablet Take 1 tablet (100 mg total) by mouth 3 (three) times daily. (Patient taking differently: Take 50 mg by mouth 2 (two) times daily. ) 270 tablet 2  . metFORMIN (GLUCOPHAGE) 1000 MG tablet TAKE 1 TABLET BY MOUTH TWICE A DAY WITH A MEAL 60 tablet 3  . pravastatin (  PRAVACHOL) 40 MG tablet Take 1 tablet (40 mg total) by mouth daily. 30 tablet 5  . zonisamide (ZONEGRAN) 50 MG capsule Take 50 mg by mouth daily.     No current facility-administered medications on file prior to visit.     No Known Allergies  Blood pressure 108/62, pulse 98.   Assessment/Plan: Hypertension: BP is controlled in office today and seems to be improved at home with fewer spikes. Will have her continue her current therapies and monitoring. Have recommended taking chlorthalidone in the morning to decrease nocturia. Will defer any other med changes until prescription coverage is renewed in November. Patient is in agreement and feels this regimen is actually the best regimen for balancing side  effects and pressure control. Follow up in 2-3 months.    Thank you, Lelan Pons. Patterson Hammersmith, [facility]  10/13/2015 2:49 PM

## 2015-10-14 ENCOUNTER — Encounter: Payer: Self-pay | Admitting: Pharmacist

## 2015-10-20 ENCOUNTER — Encounter: Payer: Self-pay | Admitting: Internal Medicine

## 2015-10-28 ENCOUNTER — Ambulatory Visit (INDEPENDENT_AMBULATORY_CARE_PROVIDER_SITE_OTHER): Payer: BLUE CROSS/BLUE SHIELD | Admitting: Podiatry

## 2015-10-28 ENCOUNTER — Ambulatory Visit (INDEPENDENT_AMBULATORY_CARE_PROVIDER_SITE_OTHER): Payer: Self-pay

## 2015-10-28 ENCOUNTER — Encounter: Payer: Self-pay | Admitting: Podiatry

## 2015-10-28 VITALS — BP 144/90 | HR 85

## 2015-10-28 DIAGNOSIS — M79672 Pain in left foot: Secondary | ICD-10-CM

## 2015-10-28 DIAGNOSIS — M779 Enthesopathy, unspecified: Secondary | ICD-10-CM

## 2015-10-28 DIAGNOSIS — M21619 Bunion of unspecified foot: Secondary | ICD-10-CM

## 2015-10-28 DIAGNOSIS — D169 Benign neoplasm of bone and articular cartilage, unspecified: Secondary | ICD-10-CM

## 2015-10-28 NOTE — Progress Notes (Signed)
Subjective:     Patient ID: Carrie Singh, female   DOB: [birth year], 60 y.o.   MRN: [id number]  HPI patient states this left bunion has really started to bother her like the right one which I corrected years ago and she has pain in the outside of the left ankle that becomes difficult to walk with and she states that she's tried wider shoes she's tried padding and she's tried soaks without relief of symptoms   Review of Systems  All other systems reviewed and are negative.      Objective:   Physical Exam  Constitutional: She is oriented to person, place, and time.  Cardiovascular: Intact distal pulses.   Musculoskeletal: Normal range of motion.  Neurological: She is oriented to person, place, and time.  Skin: Skin is warm.  Nursing note and vitals reviewed.  neurovascular status intact muscle strength adequate range of motion within normal limits with patient found to have discomfort in the left first MPJ with fluid buildup around the joint surface slightly in the outside of the left fifth MPJ and is found to have quite a bit of discomfort in the sinus tarsi left and the lateral ankle gutter. Patient has redness and swelling around the first MPJ which probably causes gait change and was found to have good digital perfusion and is well oriented 3. 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 Ultimately I do think this will require structural correction of bunion like the right foot due to the significant changes on x-ray but today I'm focusing on the ankle and injected the left ankle gutter 3 mg Kenalog 5 mg Xylocaine and we'll have back in November to discuss surgical intervention  X-ray report indicate structural bunion deformity left with elevation  of the intermetatarsal angle between the first and second metatarsals of 16 with dorsal spurring of the navicular and also mild digital deformity noted good correction with pin in place and good alignment

## 2015-11-23 ENCOUNTER — Encounter: Payer: Self-pay | Admitting: Internal Medicine

## 2015-11-25 ENCOUNTER — Ambulatory Visit: Payer: Self-pay | Admitting: Podiatry

## 2015-11-26 ENCOUNTER — Encounter: Payer: Self-pay | Admitting: Internal Medicine

## 2015-11-27 ENCOUNTER — Telehealth: Payer: Self-pay | Admitting: Internal Medicine

## 2015-11-27 NOTE — Telephone Encounter (Signed)
[employer]  Disability claims faxed paperwork I sent records release to [employer] to be copied and mailed to [employer] this will be send to [employer] 12/01/15   Paperwork in regina's IN BOX For review and signature

## 2015-11-30 NOTE — Telephone Encounter (Signed)
Left message on voicemail [employer] Glad to return my call to get an idea of all is needed to complete claim---Regina showed me paperwork received, not sure if more is needed

## 2015-12-01 NOTE — Telephone Encounter (Signed)
Done, given back to [name] 

## 2015-12-01 NOTE — Telephone Encounter (Signed)
Paperwork faxed °Copy for pt  °Copy for file °Copy for scan °Pt aware  °

## 2015-12-18 ENCOUNTER — Other Ambulatory Visit: Payer: Self-pay | Admitting: Internal Medicine

## 2015-12-18 DIAGNOSIS — Z1231 Encounter for screening mammogram for malignant neoplasm of breast: Secondary | ICD-10-CM

## 2015-12-24 ENCOUNTER — Encounter: Payer: Self-pay | Admitting: Internal Medicine

## 2016-01-20 ENCOUNTER — Ambulatory Visit (INDEPENDENT_AMBULATORY_CARE_PROVIDER_SITE_OTHER): Payer: BLUE CROSS/BLUE SHIELD | Admitting: Podiatry

## 2016-01-20 ENCOUNTER — Encounter: Payer: Self-pay | Admitting: Podiatry

## 2016-01-20 DIAGNOSIS — D169 Benign neoplasm of bone and articular cartilage, unspecified: Secondary | ICD-10-CM

## 2016-01-20 DIAGNOSIS — M21619 Bunion of unspecified foot: Secondary | ICD-10-CM

## 2016-01-20 DIAGNOSIS — T148XXA Other injury of unspecified body region, initial encounter: Secondary | ICD-10-CM | POA: Diagnosis not present

## 2016-01-21 NOTE — Progress Notes (Signed)
Subjective:     Patient ID: Carrie Singh, female   DOB: 03-10-1955, 61 y.o.   MRN: [id number]  HPI patient states she continues to experience discomfort and pain in the outside of the left ankle with fluid buildup noted and what does not appear to be loss of function of the tendon   Review of Systems     Objective:   Physical Exam Neurovascular status intact muscle strength adequate with patient's left peroneal tendon continued exhibiting swelling and pain when palpated    Assessment:     Cannot rule out interstitial tear of the left peroneal tendon due to continued pain and failure to respond conservatively up to this point    Plan:     I'm sending for MRI to try to rule out a tear of the peroneal tendon and also advised on heat ice therapy and we will reevaluate once we get results

## 2016-01-21 NOTE — Progress Notes (Signed)
mri

## 2016-01-22 ENCOUNTER — Ambulatory Visit
Admission: RE | Admit: 2016-01-22 | Discharge: 2016-01-22 | Disposition: A | Discharge status: Home / Self Care | Payer: BLUE CROSS/BLUE SHIELD | Source: Ambulatory Visit | Attending: Internal Medicine | Admitting: Internal Medicine

## 2016-01-22 DIAGNOSIS — Z1231 Encounter for screening mammogram for malignant neoplasm of breast: Secondary | ICD-10-CM

## 2016-01-25 ENCOUNTER — Other Ambulatory Visit: Payer: Self-pay | Admitting: Internal Medicine

## 2016-01-25 DIAGNOSIS — R928 Other abnormal and inconclusive findings on diagnostic imaging of breast: Secondary | ICD-10-CM

## 2016-01-28 ENCOUNTER — Ambulatory Visit
Admission: RE | Admit: 2016-01-28 | Discharge: 2016-01-28 | Disposition: A | Discharge status: Home / Self Care | Payer: BLUE CROSS/BLUE SHIELD | Source: Ambulatory Visit | Attending: Podiatry | Admitting: Podiatry

## 2016-02-01 ENCOUNTER — Ambulatory Visit
Admission: RE | Admit: 2016-02-01 | Discharge: 2016-02-01 | Disposition: A | Discharge status: Home / Self Care | Payer: BLUE CROSS/BLUE SHIELD | Source: Ambulatory Visit | Attending: Internal Medicine | Admitting: Internal Medicine

## 2016-02-01 ENCOUNTER — Other Ambulatory Visit: Payer: Self-pay | Admitting: Internal Medicine

## 2016-02-01 DIAGNOSIS — R928 Other abnormal and inconclusive findings on diagnostic imaging of breast: Secondary | ICD-10-CM

## 2016-02-01 DIAGNOSIS — N6310 Unspecified lump in the right breast, unspecified quadrant: Secondary | ICD-10-CM

## 2016-02-02 ENCOUNTER — Ambulatory Visit
Admission: RE | Admit: 2016-02-02 | Discharge: 2016-02-02 | Disposition: A | Discharge status: Home / Self Care | Payer: BLUE CROSS/BLUE SHIELD | Source: Ambulatory Visit | Attending: Internal Medicine | Admitting: Internal Medicine

## 2016-02-02 ENCOUNTER — Other Ambulatory Visit: Payer: Self-pay | Admitting: Internal Medicine

## 2016-02-02 DIAGNOSIS — N6310 Unspecified lump in the right breast, unspecified quadrant: Secondary | ICD-10-CM

## 2016-02-05 ENCOUNTER — Telehealth: Payer: Self-pay | Admitting: Internal Medicine

## 2016-02-05 NOTE — Telephone Encounter (Signed)
[facility] faxed paperwork to be filled out In Carrie Singh's in box For review and signature

## 2016-02-05 NOTE — Telephone Encounter (Signed)
Paperwork faxed Tried calling pt no answer Copy for pt Copy for scan Copy for file

## 2016-02-05 NOTE — Telephone Encounter (Signed)
Done, in my outbox

## 2016-02-06 ENCOUNTER — Other Ambulatory Visit: Payer: Self-pay | Admitting: Cardiovascular Disease

## 2016-02-08 ENCOUNTER — Telehealth: Payer: Self-pay | Admitting: Internal Medicine

## 2016-02-08 NOTE — Telephone Encounter (Signed)
Pt aware.

## 2016-02-10 ENCOUNTER — Encounter: Payer: Self-pay | Admitting: Genetic Counselor

## 2016-02-15 ENCOUNTER — Encounter: Payer: Self-pay | Admitting: Internal Medicine

## 2016-02-16 MED ORDER — PRAVASTATIN SODIUM 40 MG PO TABS: 40.0000 mg | ORAL_TABLET | Freq: Every day | ORAL | 5 refills | Status: DC

## 2016-02-23 ENCOUNTER — Other Ambulatory Visit: Payer: Self-pay | Admitting: Surgery

## 2016-02-23 DIAGNOSIS — C50911 Malignant neoplasm of unspecified site of right female breast: Secondary | ICD-10-CM

## 2016-02-23 DIAGNOSIS — Z170 Estrogen receptor positive status [ER+]: Principal | ICD-10-CM

## 2016-02-24 ENCOUNTER — Encounter: Payer: Self-pay | Admitting: Internal Medicine

## 2016-02-24 ENCOUNTER — Ambulatory Visit (INDEPENDENT_AMBULATORY_CARE_PROVIDER_SITE_OTHER): Payer: BLUE CROSS/BLUE SHIELD | Admitting: Internal Medicine

## 2016-02-24 VITALS — BP 124/78 | HR 82 | Temp 98.5°F | Wt 218.0 lb

## 2016-02-24 DIAGNOSIS — M25551 Pain in right hip: Secondary | ICD-10-CM | POA: Diagnosis not present

## 2016-02-24 MED ORDER — PREDNISONE 10 MG PO TABS: ORAL_TABLET | ORAL | 0 refills | Status: DC

## 2016-02-24 NOTE — Progress Notes (Signed)
Subjective:    Patient ID: Carrie Singh, female    DOB: 1955-10-19, 61 y.o.   MRN: [id number]  HPI  Pt presents to the clinic today with c/o right hip pain. This started 1 month ago. She describes the pain as achy and throbbing. The pain radiates down to her foot. She denies numbness or tingling. She feels like she has trouble moving her hip and bearing weight. Sitting for long periods of time makes the pain worse. Walking seems to make the pain better. She denies any recent falls. She has taken Goody's powders as needed with minimal relief.  Review of Systems      Past Medical History:  Diagnosis Date  . Breast cancer ([place])   . Breast cancer, right breast ([place]) 12/2007   a. s/p chemo, radiation, and lumpectomy with negative sentinel lymph node biopsy   . Diabetes mellitus without complication ([place])   . Hyperlipidemia   . Hypertension    resistant, negative renal Dopplers and negative CT angiogram  . Migraines   . Obesity     Current Outpatient Prescriptions  Medication Sig Dispense Refill  . Aspirin-Salicylamide-Caffeine (BC HEADACHE POWDER PO) Take 1 packet by mouth daily as needed (for headache). Reported on 05/06/2015    . chlorthalidone (HYGROTON) 25 MG tablet TAKE 1 TABLET (25 MG TOTAL) BY MOUTH DAILY. 30 tablet 4  . citalopram (CELEXA) 20 MG tablet TAKE 1 TABLET (20 MG TOTAL) BY MOUTH DAILY. 30 tablet 6  . hydrALAZINE (APRESOLINE) 100 MG tablet Take 1 tablet (100 mg total) by mouth 2 (two) times daily.    . irbesartan (AVAPRO) 300 MG tablet     . metFORMIN (GLUCOPHAGE) 1000 MG tablet TAKE 1 TABLET BY MOUTH TWICE A DAY WITH A MEAL 60 tablet 3  . pravastatin (PRAVACHOL) 40 MG tablet Take 1 tablet (40 mg total) by mouth daily. 30 tablet 5  . zonisamide (ZONEGRAN) 50 MG capsule Take 50 mg by mouth daily.     No current facility-administered medications for this visit.     No Known Allergies  Family History  Problem Relation Age of Onset  . Stroke Mother   . Heart  disease Father   . Asthma Father   . Cancer Father     colon  . Cancer Sister     lung  . Cancer Brother     colon  . Breast cancer Other 48  . Diabetes Neg Hx     Social History   Social History  . Marital status: Married    Spouse name: [name]  . Number of children: N/A  . Years of education: 12+   Occupational History  . [employer]    Social History Main Topics  . Smoking status: Never Smoker  . Smokeless tobacco: Never Used  . Alcohol use No  . Drug use: No  . Sexual activity: Yes   Other Topics Concern  . Not on file   Social History Narrative   Lives at home with husband.   Caffeine use: 1-2 drinks per month         Epworth Sleepiness Scale = 15 (as of 01/01/15)     Constitutional: Denies fever, malaise, fatigue, headache or abrupt weight changes.  Musculoskeletal: Pt reports right hip pain. Denies joint swelling.   No other specific complaints in a complete review of systems (except as listed in HPI above).  Objective:   Physical Exam   BP 124/78   Pulse 82   Temp 98.5 F (  36.9 C) (Oral)   Wt 218 lb (98.9 kg)   SpO2 98%   BMI 35.91 kg/m  Wt Readings from Last 3 Encounters:  02/24/16 218 lb (98.9 kg)  09/08/15 214 lb (97.1 kg)  08/18/15 213 lb 9.6 oz (96.9 kg)    General: Appears her stated age, obese in NAD. Musculoskeletal: Normal flexion, extension, rotation and lateral bending of the spine. Normal flexion and extension of the right hip. Pain with adduction, abduction, internal and external rotation of the right hip. No pain with palpation of the lumbar spine, right SI joint or iliac crest. Unable to palpate the trochanter due to size. She is able to tandem walk, walk on toes and on heels.   BMET    Component Value Date/Time   NA 143 10/12/2015 1603   NA 146 (H) 01/20/2015 1457   NA 143 10/12/2013 1510   K 4.2 10/12/2015 1603   K 3.9 10/12/2013 1510   CL 107 10/12/2015 1603   CL 109 (H) 10/12/2013 1510   CO2 29 10/12/2015 1603   CO2  29 10/12/2013 1510   GLUCOSE 135 (H) 10/12/2015 1603   GLUCOSE 92 10/12/2013 1510   BUN 24 10/12/2015 1603   BUN 17 01/20/2015 1457   BUN 16 10/12/2013 1510   CREATININE 1.15 (H) 10/12/2015 1603   CALCIUM 9.6 10/12/2015 1603   CALCIUM 9.4 10/12/2013 1510   GFRNONAA 41 (L) 03/28/2015 1534   GFRNONAA 56 (L) 10/12/2013 1510   GFRAA 48 (L) 03/28/2015 1534   GFRAA >60 10/12/2013 1510    Lipid Panel     Component Value Date/Time   CHOL 170 09/08/2015 0940   CHOL 179 10/17/2014 1527   TRIG 111.0 09/08/2015 0940   HDL 62.40 09/08/2015 0940   HDL 59 10/17/2014 1527   CHOLHDL 3 09/08/2015 0940   VLDL 22.2 09/08/2015 0940   LDLCALC 85 09/08/2015 0940   LDLCALC 91 10/17/2014 1527    CBC    Component Value Date/Time   WBC 6.0 09/08/2015 0940   RBC 4.18 09/08/2015 0940   HGB 12.4 09/08/2015 0940   HGB 13.5 10/12/2013 1510   HGB 12.8 08/13/2009 0946   HCT 36.0 09/08/2015 0940   HCT 40.5 10/12/2013 1510   HCT 36.8 08/13/2009 0946   PLT 263.0 09/08/2015 0940   PLT 278 10/12/2013 1510   PLT 271 08/13/2009 0946   MCV 86.2 09/08/2015 0940   MCV 87 10/12/2013 1510   MCV 87.0 08/13/2009 0946   MCH 28.6 03/28/2015 1534   MCHC 34.3 09/08/2015 0940   RDW 13.3 09/08/2015 0940   RDW 13.6 10/12/2013 1510   RDW 13.8 08/13/2009 0946   LYMPHSABS 1.6 03/28/2015 1700   LYMPHSABS 1.5 08/13/2009 0946   MONOABS 0.4 03/28/2015 1700   MONOABS 0.2 08/13/2009 0946   EOSABS 0.1 03/28/2015 1700   EOSABS 0.1 08/13/2009 0946   BASOSABS 0.0 03/28/2015 1700   BASOSABS 0.0 08/13/2009 0946    Hgb A1C Lab Results  Component Value Date   HGBA1C 6.4 09/08/2015           Assessment & Plan:   Right hip pain:  Advised her to stop Goody's Powders for now eRx for Pred Taper x 9 days Hip exercises given If persist or worsens, will pursue xray of right hip  RTC as needed or if sympotms persist or worsen BAITY, REGINA, NP

## 2016-02-24 NOTE — Patient Instructions (Addendum)
Hip Exercises Ask your health care provider which exercises are safe for you. Do exercises exactly as told by your health care provider and adjust them as directed. It is normal to feel mild stretching, pulling, tightness, or discomfort as you do these exercises, but you should stop right away if you feel sudden pain or your pain gets worse.Do not begin these exercises until told by your health care provider. STRETCHING AND RANGE OF MOTION EXERCISES  These exercises warm up your muscles and joints and improve the movement and flexibility of your hip. These exercises also help to relieve pain, numbness, and tingling. Exercise A: Hamstrings, Supine   1. Lie on your back. 2. Loop a belt or towel over the ball of your left / rightfoot. The ball of your foot is on the walking surface, right under your toes. 3. Straighten your left / rightknee and slowly pull on the belt to raise your leg.  Do not let your left / right knee bend while you do this.  Keep your other leg flat on the floor.  Raise the left / right leg until you feel a gentle stretch behind your left / right knee or thigh. 4. Hold this position for __________ seconds. 5. Slowly return your leg to the starting position. Repeat __________ times. Complete this stretch __________ times a day. Exercise B: Hip Rotators   1. Lie on your back on a firm surface. 2. Hold your left / right knee with your left / right hand. Hold your ankle with your other hand. 3. Gently pull your left / right knee and rotate your lower leg toward your other shoulder.  Pull until you feel a stretch in your buttocks.  Keep your hips and shoulders firmly planted while you do this stretch. 4. Hold this position for __________ seconds. Repeat __________ times. Complete this stretch __________ times a day. Exercise C: V-Sit (Hamstrings and Adductors)   1. Sit on the floor with your legs extended in a large "V" shape. Keep your knees straight during this  exercise. 2. Start with your head and chest upright, then bend at your waist to reach for your left foot (position A). You should feel a stretch in your right inner thigh. 3. Hold this position for __________ seconds. Then slowly return to the upright position. 4. Bend at your waist to reach forward (position B). You should feel a stretch behind both of your thighs and knees. 5. Hold this position for __________ seconds. Then slowly return to the upright position. 6. Bend at your waist to reach for your right foot (position C). You should feel a stretch in your left inner thigh. 7. Hold this position for __________ seconds. Then slowly return to the upright position. Repeat __________ times. Complete this stretch __________ times a day. Exercise D: Lunge (Hip Flexors)   1. Place your left / right knee on the floor and bend your other knee so that is directly over your ankle. You should be half-kneeling. 2. Keep good posture with your head over your shoulders. 3. Tighten your buttocks to point your tailbone downward. This helps your back to keep from arching too much. 4. You should feel a gentle stretch in the front of your left / right thigh and hip. If you do not feel any resistance, slightly slide your other foot forward and then slowly lunge forward so your knee once again lines up over your ankle. 5. Make sure your tailbone continues to point downward. 6. Hold this   position for __________ seconds. Repeat __________ times. Complete this stretch __________ times a day. STRENGTHENING EXERCISES  These exercises build strength and endurance in your hip. Endurance is the ability to use your muscles for a long time, even after they get tired. Exercise E: Bridge (Hip Extensors)   1. Lie on your back on a firm surface with your knees bent and your feet flat on the floor. 2. Tighten your buttocks muscles and lift your bottom off the floor until the trunk of your body is level with your thighs.  Do  not arch your back.  You should feel the muscles working in your buttocks and the back of your thighs. If you do not feel these muscles, slide your feet 1-2 inches (2.5-5 cm) farther away from your buttocks. 3. Hold this position for __________ seconds. 4. Slowly lower your hips to the starting position. 5. Let your muscles relax completely between repetitions. 6. If this exercise is too easy, try doing it with your arms crossed over your chest. Repeat __________ times. Complete this exercise __________ times a day. Exercise F: Straight Leg Raises - Hip Abductors   1. Lie on your side with your left / right leg in the top position. Lie so your head, shoulder, knee, and hip line up with each other. You may bend your bottom knee to help you balance. 2. Roll your hips slightly forward, so your hips are stacked directly over each other and your left / right knee is facing forward. 3. Leading with your heel, lift your top leg 4-6 inches (10-15 cm). You should feel the muscles in your outer hip lifting.  Do not let your foot drift forward.  Do not let your knee roll toward the ceiling. 4. Hold this position for __________ seconds. 5. Slowly return to the starting position. 6. Let your muscles relax completely between repetitions. Repeat __________ times. Complete this exercise __________ times a day. Exercise G: Straight Leg Raises - Hip Adductors   1. Lie on your side with your left / right leg in the bottom position. Lie so your head, shoulder, knee, and hip line up. You may place your upper foot in front to help you balance. 2. Roll your hips slightly forward, so your hips are stacked directly over each other and your left / right knee is facing forward. 3. Tense the muscles in your inner thigh and lift your bottom leg 4-6 inches (10-15 cm). 4. Hold this position for __________ seconds. 5. Slowly return to the starting position. 6. Let your muscles relax completely between  repetitions. Repeat __________ times. Complete this exercise __________ times a day. Exercise H: Straight Leg Raises - Quadriceps   1. Lie on your back with your left / right leg extended and your other knee bent. 2. Tense the muscles in the front of your left / right thigh. When you do this, you should see your kneecap slide up or see increased dimpling just above your knee. 3. Tighten these muscles even more and raise your leg 4-6 inches (10-15 cm) off the floor. 4. Hold this position for __________ seconds. 5. Keep these muscles tense as you lower your leg. 6. Relax the muscles slowly and completely between repetitions. Repeat __________ times. Complete this exercise __________ times a day. Exercise I: Hip Abductors, Standing  1. Tie one end of a rubber exercise band or tubing to a secure surface, such as a table or pole. 2. Loop the other end of the band or tubing   around your left / right ankle. 3. Keeping your ankle with the band or tubing directly opposite of the secured end, step away until there is tension in the tubing or band. Hold onto a chair as needed for balance. 4. Lift your left / right leg out to your side. While you do this:  Keep your back upright.  Keep your shoulders over your hips.  Keep your toes pointing forward.  Make sure to use your hip muscles to lift your leg. Do not "throw" your leg or tip your body to lift your leg. 5. Hold this position for __________ seconds. 6. Slowly return to the starting position. Repeat __________ times. Complete this exercise __________ times a day. Exercise J: Squats (Quadriceps)  1. Stand in a door frame so your feet and knees are in line with the frame. You may place your hands on the frame for balance. 2. Slowly bend your knees and lower your hips like you are going to sit in a chair.  Keep your lower legs in a straight-up-and-down position.  Do not let your hips go lower than your knees.  Do not bend your knees lower than  told by your health care provider.  If your hip pain increases, do not bend as low. 3. Hold this position for ___________ seconds. 4. Slowly push with your legs to return to standing. Do not use your hands to pull yourself to standing. Repeat __________ times. Complete this exercise __________ times a day. This information is not intended to replace advice given to you by your health care provider. Make sure you discuss any questions you have with your health care provider. Document Released: 01/21/2005 Document Revised: 09/28/2015 Document Reviewed: 12/29/2014 Elsevier Interactive Patient Education  2017 [employer]  

## 2016-02-25 ENCOUNTER — Telehealth: Payer: Self-pay | Admitting: Hematology and Oncology

## 2016-02-25 NOTE — Telephone Encounter (Signed)
Appt scheduled with Dr. Lindi Adie on 2/12 at 345pm. Pt agreed to the appt date and time. Demographics verified. Location given.

## 2016-02-28 ENCOUNTER — Other Ambulatory Visit: Payer: Self-pay | Admitting: Cardiovascular Disease

## 2016-02-29 ENCOUNTER — Ambulatory Visit: Payer: BLUE CROSS/BLUE SHIELD | Admitting: Hematology and Oncology

## 2016-03-01 ENCOUNTER — Encounter: Payer: Self-pay | Admitting: Hematology and Oncology

## 2016-03-01 ENCOUNTER — Telehealth: Payer: Self-pay | Admitting: Hematology and Oncology

## 2016-03-01 NOTE — Telephone Encounter (Signed)
Need to reschedule her appointments from 2/12 " she thought the appointment was 2/13"  It was a new patient appointments  [phone]

## 2016-03-02 ENCOUNTER — Encounter: Payer: Self-pay | Admitting: *Deleted

## 2016-03-02 ENCOUNTER — Ambulatory Visit (HOSPITAL_BASED_OUTPATIENT_CLINIC_OR_DEPARTMENT_OTHER): Payer: BLUE CROSS/BLUE SHIELD | Admitting: Hematology and Oncology

## 2016-03-02 ENCOUNTER — Encounter: Payer: Self-pay | Admitting: Hematology and Oncology

## 2016-03-02 DIAGNOSIS — Z853 Personal history of malignant neoplasm of breast: Secondary | ICD-10-CM

## 2016-03-02 DIAGNOSIS — Z170 Estrogen receptor positive status [ER+]: Secondary | ICD-10-CM

## 2016-03-02 DIAGNOSIS — C50511 Malignant neoplasm of lower-outer quadrant of right female breast: Secondary | ICD-10-CM | POA: Diagnosis not present

## 2016-03-02 NOTE — Assessment & Plan Note (Signed)
Recurrent right breast cancer: Original diagnosis December 2009: Right breast cancer treated with lumpectomy followed by radiation and 5 years of antiestrogen therapy (Dr. Audelia Hives and [facility] [place]) Right breast biopsy 6:00 position: IDC with DCIS grade 2, ER 100%, PR 90%, Ki-67 10%, HER-2 negative ratio 1.28, screening detected right breast lumps 0.5 cm and 0.3 cm, T1a N0 stage IA clinical stage  Pathology and radiology counseling: Discussed with the patient, the details of pathology including the type of breast cancer,the clinical staging, the significance of ER, PR and HER-2/neu receptors and the implications for treatment. After reviewing the pathology in detail, we proceeded to discuss the different treatment options between surgery, radiation, chemotherapy, antiestrogen therapies.  Recommendation: 1. Mastectomy 2.  followed by adjuvant antiestrogen therapy with aromatase inhibitor for 5 years. Patient will find out what aromatase inhibitor she tolerated the best previously. From our records it appears that she had received anastrozole. But it is unclear if it was anastrozole that she could not tolerate.   Patient was inquiring if she should go to [facility] [place] for second opinion. I discussed with her that there would not be any other treatments that would benefit her. However if she felt that she needs to go there, she is welcome to pursue the second opinion.  Return to clinic after mastectomy to discuss the final pathology.

## 2016-03-02 NOTE — Progress Notes (Signed)
[facility] NOTE  Patient Care Team: Jearld Fenton, NP as PCP - General (Internal Medicine) Minna Merritts, MD as Consulting Physician (Cardiology)  CHIEF COMPLAINTS/PURPOSE OF CONSULTATION:  Newly diagnosed breast cancer  HISTORY OF PRESENTING ILLNESS:  Carrie Singh 61 y.o. female is here because of recent diagnosis of right breast cancer. Patient had a prior history of breast cancer in 2009 that was treated with lumpectomy followed by radiation and 5 years of antiestrogen therapy. Initially she was on an aromatase inhibitor but she developed diffuse muscle aches and pains. She subsequently went to cancer treatment centers of [place] and they changed the antiestrogen therapy and she felt better on that medication and she finished 5 years of treatment. She had been getting annual mammograms and the most recent mammogram detected an abnormality once again in the right breast. There were 2 lesions one measuring 5 mm other measuring 3 mm. This was biopsy-proven to be recurrent breast cancer that was ER/PR positive HER-2 negative. She was sent was for discussion regarding adjuvant treatment options. Patient does complain of upper back pain as well as intermittent right lower quadrant abdominal pain.  I reviewed her records extensively and collaborated the history with the patient.  SUMMARY OF ONCOLOGIC HISTORY:   Malignant neoplasm of lower-outer quadrant of right breast of female, estrogen receptor positive ([place])   12/18/2007 Initial Diagnosis    Right breast cancer treated with lumpectomy followed by radiation and 5 years of antiestrogen therapy with aromatase inhibitor      02/02/2016 Relapse/Recurrence    Right breast biopsy 6:00 position: IDC with DCIS grade 2, ER 100%, PR 90%, Ki-67 10%, HER-2 negative ratio 1.28, screening detected right breast lumps 0.5 cm and 0.3 cm, T1a N0 stage IA clinical stage      MEDICAL HISTORY:  Past Medical History:  Diagnosis Date   . Breast cancer ([place])   . Breast cancer, right breast ([place]) 12/2007   a. s/p chemo, radiation, and lumpectomy with negative sentinel lymph node biopsy   . Diabetes mellitus without complication ([place])   . Hyperlipidemia   . Hypertension    resistant, negative renal Dopplers and negative CT angiogram  . Migraines   . Obesity     SURGICAL HISTORY: Past Surgical History:  Procedure Laterality Date  . ABDOMINAL HYSTERECTOMY    . BREAST LUMPECTOMY    . CYST EXCISION    . FOOT SURGERY    . ovary tumor    . vocal cord nodules      SOCIAL HISTORY: Social History   Social History  . Marital status: Married    Spouse name: [name]  . Number of children: N/A  . Years of education: 12+   Occupational History  . [employer]    Social History Main Topics  . Smoking status: Never Smoker  . Smokeless tobacco: Never Used  . Alcohol use No  . Drug use: No  . Sexual activity: Yes   Other Topics Concern  . Not on file   Social History Narrative   Lives at home with husband.   Caffeine use: 1-2 drinks per month         Epworth Sleepiness Scale = 15 (as of 01/01/15)    FAMILY HISTORY: Family History  Problem Relation Age of Onset  . Stroke Mother   . Heart disease Father   . Asthma Father   . Cancer Father     colon  . Cancer Sister     lung  .  Cancer Brother     colon  . Breast cancer Other 48  . Diabetes Neg Hx     ALLERGIES:  has No Known Allergies.  MEDICATIONS:  Current Outpatient Prescriptions  Medication Sig Dispense Refill  . Aspirin-Salicylamide-Caffeine (BC HEADACHE POWDER PO) Take 1 packet by mouth daily as needed (for headache). Reported on 05/06/2015    . chlorthalidone (HYGROTON) 25 MG tablet TAKE 1 TABLET (25 MG TOTAL) BY MOUTH DAILY. 30 tablet 4  . citalopram (CELEXA) 20 MG tablet TAKE 1 TABLET (20 MG TOTAL) BY MOUTH DAILY. 30 tablet 6  . hydrALAZINE (APRESOLINE) 100 MG tablet Take 1 tablet (100 mg total) by mouth 2 (two) times daily.    . irbesartan  (AVAPRO) 300 MG tablet Take by mouth daily.     . metFORMIN (GLUCOPHAGE) 1000 MG tablet TAKE 1 TABLET BY MOUTH TWICE A DAY WITH A MEAL 60 tablet 3  . pravastatin (PRAVACHOL) 40 MG tablet Take 1 tablet (40 mg total) by mouth daily. 30 tablet 5  . predniSONE (DELTASONE) 10 MG tablet Take 3 tabs on days 1-3, take 2 tabs on days 4-6, take 1 tab on days 7-9 (Patient taking differently: Take 10 mg by mouth 3 (three) times daily. ) 18 tablet 0  . zonisamide (ZONEGRAN) 50 MG capsule Take 50 mg by mouth daily as needed.      No current facility-administered medications for this visit.     REVIEW OF SYSTEMS:   Constitutional: Denies fevers, chills or abnormal night sweats Eyes: Denies blurriness of vision, double vision or watery eyes Ears, nose, mouth, throat, and face: Denies mucositis or sore throat Respiratory: Denies cough, dyspnea or wheezes Cardiovascular: Denies palpitation, chest discomfort or lower extremity swelling Gastrointestinal:  Abdominal pain and upper back pain Skin: Denies abnormal skin rashes Lymphatics: Denies new lymphadenopathy or easy bruising Neurological:Denies numbness, tingling or new weaknesses Behavioral/Psych: Mood is stable, no new changes  Breast:  Denies any palpable lumps or discharge All other systems were reviewed with the patient and are negative.  PHYSICAL EXAMINATION: ECOG PERFORMANCE STATUS: 0 - Asymptomatic  Vitals:   03/02/16 1449  BP: 136/67  Pulse: (!) 104  Resp: 18  Temp: 98 F (36.7 C)   Filed Weights   03/02/16 1449  Weight: 214 lb 3.2 oz (97.2 kg)    GENERAL:alert, no distress and comfortable SKIN: skin color, texture, turgor are normal, no rashes or significant lesions EYES: normal, conjunctiva are pink and non-injected, sclera clear OROPHARYNX:no exudate, no erythema and lips, buccal mucosa, and tongue normal  NECK: supple, thyroid normal size, non-tender, without nodularity LYMPH:  no palpable lymphadenopathy in the cervical,  axillary or inguinal LUNGS: clear to auscultation and percussion with normal breathing effort HEART: regular rate & rhythm and no murmurs and no lower extremity edema ABDOMEN:abdomen soft, non-tender and normal bowel sounds Musculoskeletal:no cyanosis of digits and no clubbing  PSYCH: alert & oriented x 3 with fluent speech NEURO: no focal motor/sensory deficits BREAST: No palpable nodules in breast. No palpable axillary or supraclavicular lymphadenopathy (exam performed in the presence of a chaperone)   LABORATORY DATA:  I have reviewed the data as listed Lab Results  Component Value Date   WBC 6.0 09/08/2015   HGB 12.4 09/08/2015   HCT 36.0 09/08/2015   MCV 86.2 09/08/2015   PLT 263.0 09/08/2015   Lab Results  Component Value Date   NA 143 10/12/2015   K 4.2 10/12/2015   CL 107 10/12/2015   CO2 29 10/12/2015  RADIOGRAPHIC STUDIES: I have personally reviewed the radiological reports and agreed with the findings in the report.  ASSESSMENT AND PLAN:  Malignant neoplasm of lower-outer quadrant of right breast of female, estrogen receptor positive ([place]) Recurrent right breast cancer: Original diagnosis December 2009: Right breast cancer treated with lumpectomy followed by radiation and 5 years of antiestrogen therapy (Dr. Audelia Hives and cancer treatment centers of [place]) Right breast biopsy 6:00 position: IDC with DCIS grade 2, ER 100%, PR 90%, Ki-67 10%, HER-2 negative ratio 1.28, screening detected right breast lumps 0.5 cm and 0.3 cm, T1a N0 stage IA clinical stage  Pathology and radiology counseling: Discussed with the patient, the details of pathology including the type of breast cancer,the clinical staging, the significance of ER, PR and HER-2/neu receptors and the implications for treatment. After reviewing the pathology in detail, we proceeded to discuss the different treatment options between surgery, radiation, chemotherapy, antiestrogen therapies.  Recommendation: 1.  Mastectomy 2.  followed by adjuvant antiestrogen therapy with aromatase inhibitor for 5 years. Patient will find out what aromatase inhibitor she tolerated the best previously. From our records it appears that she had received anastrozole. But it is unclear if it was anastrozole that she could not tolerate.   Patient was inquiring if she should go to cancer treatment centers of [place] for second opinion. I discussed with her that there would not be any other treatments that would benefit her. However if she felt that she needs to go there, she is welcome to pursue the second opinion. At this point I'm not planning to obtain CT scans. But if her symptoms continue to persist with the pains, he may obtain a CT chest abdomen pelvis and bone scan  Return to clinic after mastectomy to discuss the final pathology.   All questions were answered. The patient knows to call the clinic with any problems, questions or concerns.    Rulon Eisenmenger, MD 03/02/16

## 2016-03-03 ENCOUNTER — Telehealth: Payer: Self-pay | Admitting: *Deleted

## 2016-03-03 NOTE — Telephone Encounter (Signed)
Pt called to relate she has decided to have treatment at [facility] and that she is ready to schedule surgery. Informed pt that I would let Dr. Pollie Friar office know her decision in order for them to work on getting it scheduled. Received verbal understanding. Denies further needs at this time. Called [facility] and relayed msg on pt treatment decision.

## 2016-03-07 ENCOUNTER — Telehealth: Payer: Self-pay | Admitting: Hematology and Oncology

## 2016-03-07 ENCOUNTER — Encounter: Payer: Self-pay | Admitting: Internal Medicine

## 2016-03-07 ENCOUNTER — Telehealth: Payer: Self-pay | Admitting: *Deleted

## 2016-03-07 NOTE — Telephone Encounter (Signed)
Patient called just wanting to inform MD Gudena on her sudden sharp pain in the right breast yesterday. Patient has been experiencing dull right sided pain, but it was worse yesterday during church. Patient is scheduled for surgery next month.

## 2016-03-07 NOTE — Telephone Encounter (Signed)
lvm to inform pt of 3/9 appt at 1015 am per LOS

## 2016-03-08 ENCOUNTER — Telehealth: Payer: Self-pay

## 2016-03-08 NOTE — Telephone Encounter (Signed)
Called pt lvm regarding her r sided chest pain. Call back number provided.

## 2016-03-08 NOTE — Telephone Encounter (Signed)
Called pt and lvm

## 2016-03-09 ENCOUNTER — Telehealth: Payer: Self-pay

## 2016-03-09 NOTE — Telephone Encounter (Signed)
Received call from pt regarding her cp episode on Sunday.Pt is going for breast surgery on 3/2. Pt states that she had a sharp pain on her R breast that radiates to her collarbone, underarm and upper back. Pt states that she was taking some bc powder and since then, her pain has improved. Pt states that now "it just comes and goes around my right breast." Pt states its about a 4/10 on/off and has not been taking any pain medication. Pt states that she has had surgery in the past on her R breast before. Denies any sob, fever, lightheadedness, diziness at this time. Advised pt to continue to monitor pain, and if becomes severe and won't go away with pain medication, to call and pt will need to be evaluated. Also, advised pt to contact her surgeon's office to notify them of her symptoms so they are aware of intermittent pain prior to surgery. Told pt to keep [place] updated on her condition and we would be glad to have her come in to see Dr.Gudena. Pt is not too concern about being seen right away since her pain improved since Sunday. Pt verbalized understanding and will call for any further concerns.

## 2016-03-14 NOTE — Pre-Procedure Instructions (Cosign Needed)
    Carrie Singh  03/14/2016      [id number] - [name], [address] [name] [place] [address] Phone: [phone] Fax: [phone]  [place], [place] [address] [place] [address] Phone: [phone] Fax: [phone]    Your procedure is scheduled on Fri. March 2 @ 0900.  Report to [facility] Admitting at 0700 A.M.  Call this number if you have problems the morning of surgery:  [phone]   Remember:  Do not eat food or drink liquids after midnight.  Take these medicines the morning of surgery with A SIP OF WATER Citalopram (celexa), zonisamide (zonegran)- if needed.  Stop taking any vitamins, or Herbal Medications. No Goody's, Bc's, Aleve, Advil, Ibuprofen, Motrin or Fish oil.   Do not wear jewelry, make-up or nail polish.  Do not wear lotions, powders, or perfumes, or deoderant.  Do not shave 48 hours prior to surgery.    Do not bring valuables to the hospital.  [facility] is not responsible for any belongings or valuables.  Contacts, dentures or bridgework may not be worn into surgery.  Leave your suitcase in the car.  After surgery it may be brought to your room.  For patients admitted to the hospital, discharge time will be determined by your treatment team.  Patients discharged the day of surgery will not be allowed to drive home.   Special instructions:  See attached  Please read over the following fact sheets that you were given. Pain Booklet, Coughing and Deep Breathing, MRSA Information and Surgical Site Infection Prevention

## 2016-03-15 ENCOUNTER — Encounter (HOSPITAL_COMMUNITY): Payer: Self-pay

## 2016-03-15 ENCOUNTER — Encounter (HOSPITAL_COMMUNITY)
Admission: RE | Admit: 2016-03-15 | Discharge: 2016-03-15 | Disposition: A | Discharge status: Home / Self Care | Payer: BLUE CROSS/BLUE SHIELD | Source: Ambulatory Visit | Attending: Surgery | Admitting: Surgery

## 2016-03-15 DIAGNOSIS — E669 Obesity, unspecified: Secondary | ICD-10-CM | POA: Diagnosis not present

## 2016-03-15 DIAGNOSIS — Z853 Personal history of malignant neoplasm of breast: Secondary | ICD-10-CM | POA: Diagnosis not present

## 2016-03-15 DIAGNOSIS — Z9889 Other specified postprocedural states: Secondary | ICD-10-CM | POA: Diagnosis not present

## 2016-03-15 DIAGNOSIS — Z8709 Personal history of other diseases of the respiratory system: Secondary | ICD-10-CM | POA: Diagnosis not present

## 2016-03-15 DIAGNOSIS — Z170 Estrogen receptor positive status [ER+]: Secondary | ICD-10-CM | POA: Diagnosis not present

## 2016-03-15 DIAGNOSIS — Z90710 Acquired absence of both cervix and uterus: Secondary | ICD-10-CM | POA: Diagnosis not present

## 2016-03-15 DIAGNOSIS — G4710 Hypersomnia, unspecified: Secondary | ICD-10-CM | POA: Diagnosis not present

## 2016-03-15 DIAGNOSIS — Z923 Personal history of irradiation: Secondary | ICD-10-CM | POA: Diagnosis not present

## 2016-03-15 DIAGNOSIS — F329 Major depressive disorder, single episode, unspecified: Secondary | ICD-10-CM | POA: Diagnosis not present

## 2016-03-15 DIAGNOSIS — M1990 Unspecified osteoarthritis, unspecified site: Secondary | ICD-10-CM | POA: Diagnosis not present

## 2016-03-15 DIAGNOSIS — Z6835 Body mass index (BMI) 35.0-35.9, adult: Secondary | ICD-10-CM | POA: Diagnosis not present

## 2016-03-15 DIAGNOSIS — K219 Gastro-esophageal reflux disease without esophagitis: Secondary | ICD-10-CM | POA: Diagnosis not present

## 2016-03-15 DIAGNOSIS — Z79899 Other long term (current) drug therapy: Secondary | ICD-10-CM | POA: Diagnosis not present

## 2016-03-15 DIAGNOSIS — E7800 Pure hypercholesterolemia, unspecified: Secondary | ICD-10-CM | POA: Diagnosis not present

## 2016-03-15 DIAGNOSIS — D0511 Intraductal carcinoma in situ of right breast: Secondary | ICD-10-CM | POA: Diagnosis present

## 2016-03-15 DIAGNOSIS — M549 Dorsalgia, unspecified: Secondary | ICD-10-CM | POA: Diagnosis not present

## 2016-03-15 DIAGNOSIS — I10 Essential (primary) hypertension: Secondary | ICD-10-CM | POA: Diagnosis not present

## 2016-03-15 DIAGNOSIS — E785 Hyperlipidemia, unspecified: Secondary | ICD-10-CM | POA: Diagnosis not present

## 2016-03-15 DIAGNOSIS — Z86010 Personal history of colonic polyps: Secondary | ICD-10-CM | POA: Diagnosis not present

## 2016-03-15 DIAGNOSIS — M25551 Pain in right hip: Secondary | ICD-10-CM | POA: Diagnosis not present

## 2016-03-15 DIAGNOSIS — G4730 Sleep apnea, unspecified: Secondary | ICD-10-CM | POA: Diagnosis not present

## 2016-03-15 DIAGNOSIS — G43909 Migraine, unspecified, not intractable, without status migrainosus: Secondary | ICD-10-CM | POA: Diagnosis not present

## 2016-03-15 DIAGNOSIS — E119 Type 2 diabetes mellitus without complications: Secondary | ICD-10-CM | POA: Diagnosis not present

## 2016-03-15 DIAGNOSIS — Z7984 Long term (current) use of oral hypoglycemic drugs: Secondary | ICD-10-CM | POA: Diagnosis not present

## 2016-03-15 HISTORY — DX: Major depressive disorder, single episode, unspecified: F32.9

## 2016-03-15 HISTORY — DX: Gastro-esophageal reflux disease without esophagitis: K21.9

## 2016-03-15 HISTORY — DX: Unspecified convulsions: R56.9

## 2016-03-15 HISTORY — DX: Depression, unspecified: F32.A

## 2016-03-15 LAB — BASIC METABOLIC PANEL
Anion gap: 11 (ref 5–15)
BUN: 18 mg/dL (ref 6–20)
CO2: 27 mmol/L (ref 22–32)
Calcium: 9.8 mg/dL (ref 8.9–10.3)
Chloride: 101 mmol/L (ref 101–111)
Creatinine, Ser: 1.24 mg/dL — ABNORMAL HIGH (ref 0.44–1.00)
GFR calc Af Amer: 53 mL/min — ABNORMAL LOW (ref >60)
GFR calc non Af Amer: 46 mL/min — ABNORMAL LOW (ref >60)
Glucose, Bld: 176 mg/dL — ABNORMAL HIGH (ref 65–99)
Potassium: 3.5 mmol/L (ref 3.5–5.1)
Sodium: 139 mmol/L (ref 135–145)

## 2016-03-15 LAB — CBC
HCT: 39.8 % (ref 36.0–46.0)
Hemoglobin: 13 g/dL (ref 12.0–15.0)
MCH: 28.5 pg (ref 26.0–34.0)
MCHC: 32.7 g/dL (ref 30.0–36.0)
MCV: 87.3 fL (ref 78.0–100.0)
Platelets: 255 10*3/uL (ref 150–400)
RBC: 4.56 MIL/uL (ref 3.87–5.11)
RDW: 13.4 % (ref 11.5–15.5)
WBC: 4.8 10*3/uL (ref 4.0–10.5)

## 2016-03-15 LAB — GLUCOSE, CAPILLARY: Glucose-Capillary: 190 mg/dL — ABNORMAL HIGH (ref 65–99)

## 2016-03-15 NOTE — Pre-Procedure Instructions (Addendum)
Carrie Singh  03/15/2016      [id number] - [place], [place] [address] Phone: [phone] Fax: [phone]  [place], [place] [address] [place] Phone: [phone] Fax: [phone]    Your procedure is scheduled on Fri. March 2 @ 0900.  Report to [facility] Admitting at 0700 A.M.  Call this number if you have problems the morning of surgery:  [phone]   Remember:  Do not eat food or drink liquids after midnight.              THE DAY OF SURGERY DRINK 8 oz. BOTTLE WATER AT 5:00 A.M.    Take these medicines the morning of surgery with A SIP OF WATER Citalopram (celexa), zonisamide (zonegran)- if needed.  Stop taking any vitamins, or Herbal Medications. No Goody's, Bc's, Aleve, Advil, Ibuprofen, Motrin or Fish oil.      How to Manage Your Diabetes Before and After Surgery  Why is it important to control my blood sugar before and after surgery? . Improving blood sugar levels before and after surgery helps healing and can limit problems. . A way of improving blood sugar control is eating a healthy diet by: o  Eating less sugar and carbohydrates o  Increasing activity/exercise o  Talking with your doctor about reaching your blood sugar goals . High blood sugars (greater than 180 mg/dL) can raise your risk of infections and slow your recovery, so you will need to focus on controlling your diabetes during the weeks before surgery. . Make sure that the doctor who takes care of your diabetes knows about your planned surgery including the date and location.  How do I manage my blood sugar before surgery? . Check your blood sugar at least 4 times a day, starting 2 days before surgery, to make sure that the level is not too high or low. o Check your blood sugar the morning of your surgery when you wake up and every 2 hours until  you get to the Short Stay unit. . If your blood sugar is less than 70 mg/dL, you will need to treat for low blood sugar: o Do not take insulin. o Treat a low blood sugar (less than 70 mg/dL) with  cup of clear juice (cranberry or apple), 4 glucose tablets, OR glucose gel. o Recheck blood sugar in 15 minutes after treatment (to make sure it is greater than 70 mg/dL). If your blood sugar is not greater than 70 mg/dL on recheck, call [phone] for further instructions. . Report your blood sugar to the short stay nurse when you get to Short Stay.  . If you are admitted to the hospital after surgery: o Your blood sugar will be checked by the staff and you will probably be given insulin after surgery (instead of oral diabetes medicines) to make sure you have good blood sugar levels. o The goal for blood sugar control after surgery is 80-180 mg/dL.      WHAT DO I DO ABOUT MY DIABETES MEDICATION?   Marland Kitchen Do not take oral diabetes medicines (pills) the morning of surgery.    Other Instructions:         Do not wear jewelry, make-up or nail polish.  Do not wear lotions, powders, or perfumes, or deoderant.  Do not shave 48 hours prior to surgery.    Do not bring  valuables to the hospital.  [facility] is not responsible for any belongings or valuables.  Contacts, dentures or bridgework may not be worn into surgery.  Leave your suitcase in the car.  After surgery it may be brought to your room.  For patients admitted to the hospital, discharge time will be determined by your treatment team.  Patients discharged the day of surgery will not be allowed to drive home.   Special instructions:  See attached  Please read over the following fact sheets that you were given. Pain Booklet, Coughing and Deep Breathing, MRSA Information and Surgical Site Infection Prevention

## 2016-03-15 NOTE — Progress Notes (Addendum)
Anesthesia PAT Evaluation: Patient is a 61 year old female scheduled for right mastectomy with right axillary SN biopsy on 03/18/16 by Dr. Alphonsa Overall. Dx: Recurrent right breast cancer.  History includes never smoker, hypertension, diabetes mellitus type 2, hyperlipidemia, right breast cancer s/p right breast lumpectomy and radiation/anti-estrogen therapy '09, migraines, depression, GERD, vocal cord nodules s/p laser therapy, hysterectomy. She told her PAT RN that she questioned if she had had seizures in the past, but denied a formal diagnosis (described as a single body jerk without altered LOC). BMI is consistent with obesity.   - PCP is Webb Silversmith, NP [facility]), last visit 02/24/16. - Cardiologist is Dr. Quay Burow, last visit 01/01/15 for HTN follow-up. He noted that patient had a "negative" Myoview stress test that year. He referred her to their [facility], last saw Tana Coast, PharmD on 08/18/15. Previously she saw Dr. Ida Rogue. (She changed cardiologists per her request.)  - Neurologist is Dr. Sarina Ill, last visit 07/27/15 for headache follow-up. She also reported getting the "shakes" and a single episode of altered consciousness/confusion and was evaluated in the ED 03/28/15 and was discharged home.  He suggested brain MRI (see below) and formal neurocognitive testing for her memory. He felt her affect was unusual, although she denied psychiatric problems other than depression. She also saw Dr. Asencion Partridge Dohmeier for sleep evaluation, but sleep study did not reveal OSA.  - HEM-ONC is Dr. Nicholas Lose, last visit 03/02/16.  Meds include Goody headache powders, chlorthalidone, Celexa, hydralazine, Avapro, metformin, pravastatin, Zonegran PRN headaches.  BP 127/75   Pulse 92   Temp 36.8 C   Resp 18   Ht 5' 5.5" (1.664 m)   Wt 213 lb 4.8 oz (96.8 kg)   SpO2 100%   BMI 34.96 kg/m   Patient evaluated due to vague history of "seizures" or jerking. Her  husband was with her. She stated that within the past 1-2 years she would have a single full body jerk. Often while lying in bed, but not while falling asleep. Jerks have also occurred while sitting. Her husband has witnessed some. Patient does not exhibit altered level of consciousness, confusion, loss of bowel or bladder function during or after the episodes. No known history of CVA/TIA. At their worst, approximately six months ago, they were happening daily. Sometime forceful enough to make her come off the bed. Episodes are now less often and jerk is not as forceful (now "barely a jump"). She doesn't think that she ever told Dr. Jaynee Eagles about these even though she was experiencing them sporadically when she last saw him. Since her blood pressure has gotten better controlled, her headaches are much better. Her confusion episodes have also improved--which she attributed more to some of the migraine prevention medications that she was on. She feels "100% better" that what she did six months ago. She reported a short right breast pain a few weeks ago that went away after Goody's Powder, but otherwise denied chest pain, SOB at rest, heart racing, edema. She does light house work and can climb up 14 stairs in her house without chest pain. She has stable dyspnea on exertions. No known CAD/MI history.   EKG 03/28/15: SR, right BBB, LAFB, probable LVH. No significant change when compared to last tracing 01/01/15.   Echo 11/13/14: Study Conclusions - Left ventricle: The cavity size was normal. Systolic function was   normal. The estimated ejection fraction was in the range of 50%   to  55%. Mild to moderate Inferior and posterior wall hypokinesis.   Doppler parameters are consistent with abnormal left ventricular   relaxation (grade 1 diastolic dysfunction). - Left atrium: The atrium was mildly dilated. - Right ventricle: Systolic function was normal. - Pulmonary arteries: Systolic pressure was within the normal    range. (Echo reviewed be Christell Faith, PA-C. Due to wall motion abnormality, he questioned whether further cardiac testing would be warranted--deferred to Dr. Rockey Situ to address at her 12/02/14 follow-up. Dr. Rockey Situ did not order any additional testing, but also did not mention the echo or stress test results in his note. Patient later changed to Dr. Gwenlyn Found, whom she saw 01/01/2015. He did not order any additional testing, but did not specifically mention echo results either. He did note that patient had a "negative" stress test.)         Nuclear stress test 10/23/14:  There was no ST segment deviation noted during stress.  Defect 1: There is a small defect of moderate severity present in the apex location. This is likely due to breast attenuation artifact.  This is a low risk study.  The left ventricular ejection fraction is mildly decreased (45-54%).  calculated ejection fraction was 33%. However, visually the ejection fraction appears to be better and close to normal. Recommend an echocardiogram.  Renal U/S 11/13/14: Impressions: Normal caliber abdominal aorta. Normal and symmetrical kidney size. Normal renal arteries, bilaterally. Abnormal structure the left kidney, consider CT imaging. The IVC and renal veins are patent. (CT abd 11/28/14 showed no renal mass.)  Split night study 01/09/15: Impression:  1. This PSG reveals no clinically significant degree of sleep apnea. The overall AHI was 4.7 and supine AHI of 8 per hour. The lowest oxygen saturation was 90%. Snoring was noted but was not associated with arousal primarily. 2. There is no evidence of hypercapnia. 3. There was no clinically significant arousals through periodic limb movements of sleep noted. Essentially fragmented sleep architecture-spontaneous arousals. 4. Frequent pseudo-spindles were seen during the sleep study. This may be the result of medication such as benzodiazepines and barbiturates.  Brain MRI 09/11/15: IMPRESSION:   This MRI of the brain with and without contrast shows the following: 1.    Scattered T2/FLAIR hyperintense foci in the subcortical and deep white matter consistent with mild chronic microvascular ischemic change, a little more than expected for age. 2.    Brain volume is normal for age. 3.    There is a "partially empty sella". As the sella turcica is normal in size, this is likely an incidental finding.  4.    There are no acute findings.  EEG 12/23/14: Impression:  This is a normal EEG recording in the waking and drowsy state. No evidence of ictal or interictal discharges are seen.  Preoperative labs noted. Non-fasting glucose 176. CBC WNL. Cr 1.24. A1c is pending. She reports morning CBGs ~ 120-140.  Reviewed above with anesthesiologist Dr. Therisa Doyne. Unclear etiology of her intermittent body jerk, but there is no associated altered LOC or post-ictal state. EEG was normal and brain MRI showed mild chronic microvascular ischemic change. Symptoms have overall improved (less often, less noticeable), as has her BP. She can continue out-patient follow-up with neurology, but otherwise he did not think she would require preoperative neurology evaluation. In regards to her cardiac testing, Dr. Ola Spurr did recommend following up with Dr. Gwenlyn Found to ensure no further cardiac testing was planned based on her 10/2014 echo report and comments by Christell Faith, PA-C. I  have sent Dr. Gwenlyn Found a staff message to review.  [facility]/Anesthesiology Phone [phone] 03/15/2016 2:14 PM  Addendum: A1c 6.8. I heard back from Dr. Gwenlyn Found. He wrote, "I reviewed the Myoview results 2016 personally and the study was low risk and nonischemic. Given this I think that she has lowest undergo her mastectomy. She does have normal LV function. I'm not concerned about the wall motion abnormality given her normal Myoview to my eye and overall preserved LV function."  If no acute changes then I  anticipate that she can proceed as planned.  [facility] Short Stay Center/Anesthesiology Phone [phone] 03/16/2016 3:19 PM

## 2016-03-15 NOTE — Progress Notes (Signed)
PCP:Dr. Webb Silversmith @ [place] Cardiologist: Dr. Gwenlyn Found Neurologist: Dr. Jaynee Eagles  Fasting sugars 129-139  Pt. Reports she has "light" seizure  At times. She was not very exact when questioning her. Last one was last week, states she "has a jerk, that last a second then goes away." States she use to have them a lot but hardly notices them now. States Dr. Jaynee Eagles isn't aware of them,but she thought Dr.  Garnette Gunner was aware.

## 2016-03-16 ENCOUNTER — Telehealth: Payer: Self-pay | Admitting: Hematology and Oncology

## 2016-03-16 ENCOUNTER — Other Ambulatory Visit: Payer: Self-pay | Admitting: Cardiovascular Disease

## 2016-03-16 LAB — HEMOGLOBIN A1C
Hgb A1c MFr Bld: 6.8 % — ABNORMAL HIGH (ref 4.8–5.6)
Mean Plasma Glucose: 148 mg/dL

## 2016-03-16 NOTE — Telephone Encounter (Signed)
Received disability paperwork to be completed  °

## 2016-03-17 NOTE — H&P (Signed)
Carrie Singh  Location: [facility] Patient #: [id number] DOB: 1955/03/22 Married / Language: English / Race: Black or African American Female  History of Present Illness   Patient words: breast cancer.   The patient is a 61 year old female who presents with a complaint of breast cancer.   The PCP is Webb Silversmith, NP  The patient was referred by Dr. Curlene Dolphin.  She comes with her husband, Carrie Singh.  I last saw Carrie Singh as Carrie Singh. She said that she was married at the time, but now has taken her husband's last name. I last saw her on 06/26/2008. She had right breast lumpectomy and sentinel lymph node biopsy on 08 January 2008 by Dr. Keturah Barre. Hagen Tidd. Her final pathology revealed a 1.9 cm invasive ductal carcinoma, 3 lymph nodes negative, ER/PR receptor positive, HER-2nue negative, Ki67 - 15%. She had radiation therapy supervised by Dr. Pablo Ledger. She was placed on Femera by Dr. Truddie Coco. But she did not like Dr. Audelia Hives nor the pill she was given. She went to [facility] in [place] and was placed on another pill which she completed for 5 years. She is unsure that medication. She has no name of the physicians she saw at [facility].  She has been followed with annual mammograms. She underwent mammograms at [facility] on 02/01/2016 which showed two small hypoechoic nodules in the inferior right breast, 6 o'clock region, for which malignancy cannot be excluded. We went round and round about her having a breast MRI, but she had an ankle MRI on 01/28/2016. She had a right breast biopsy 02/02/2016 ([id number]) - IDC, grade 2, ER - 100%, PR - 90%, Ki67 - 10%, Her2Neu - neg. This has a very similar pathology to her right breast cancer in 2009.  She has a lot of medical issues and has some difficulty giving a coherant history. She has a strange affect and rocks back and forward while talking to her.   I discussed  the options for breast cancer treatment with the patient. I discussed a multidisciplinary approach to the treatment of breast cancer, which includes medical oncology and radiation oncology. I discussed the surgical options of lumpectomy vs. mastectomy. If mastectomy, there is the possibility of reconstruction. I discussed the options of lymph node biopsy. The treatment plan depends on the pathologic staging of the tumor and the patient's personal wishes.  The risks of surgery include, but are not limited to, bleeding, infection, the need for further surgery, and nerve injury.  The patient has been given literature on the treatment of breast cancer.  Plan: 1) Right mastectomy with right axillary SLNBx (she is not interested in reconstruction - I raised this several times), 2) Oncology consult, 3) Will wait on genetics for now  Past Medical History: 1. Migraines Has seen Dr. Jaynee Eagles - the last note from Dr. Jaynee Eagles - 07/27/2015 Takes Goody powder for the pain 2. Depression She is not seeing anyone for depression.  3. Memory issues 4. "minor" seizures - she has these several times a week But I think that the term seizures is a self diagnosis 5. HTN sees Dr. Quay Burow 6. DM x 5 years On glucophage 7. On chronic disability as of Nov 2017 8. remote history of asthma 9. TAH about 1997 in [place] 10. Right hip and back pain This has been going on for about one menth 11. Left foot issues - she did not mentiont his today But she  had a left foot/ankle MRI on 01/28/2016 - sinus tarsi syndrome?? She has seen Dr. Ila Mcgill 12. Colonoscopy - 2013 [facility]   Social History: She comes with her husband, Carrie Singh. No children. On Social Security disability   Past Surgical History Aleatha Borer, LPN; 02/19/3005 6:22 PM) Breast Mass; Local Excision  Right. Colon Polyp Removal - Colonoscopy  Foot  Surgery  Right. Hysterectomy (not due to cancer) - Complete  Sentinel Lymph Node Biopsy   Diagnostic Studies History (Ammie Eversole, LPN; 06/19/3352 5:62 PM) Colonoscopy  1-5 years ago Mammogram  within last year Pap Smear  >5 years ago  Allergies (Ammie Eversole, LPN; 05/23/3891 7:34 PM) No Known Drug Allergies 02/23/2016  Medication History (Ammie Eversole, LPN; 02/25/7679 1:57 PM) Hygroton (25MG Tablet, Oral) Active. CeleXA (20MG Tablet, Oral) Active. Apresoline (100MG Tablet, Oral) Active. Avapro (150MG Tablet, Oral) Active. Glucophage (1000MG Tablet, Oral) Active. Pravachol (40MG Tablet, Oral) Active. Medications Reconciled  Social History (Ammie Eversole, LPN; 02/22/2033 5:97 PM) Caffeine use  Coffee, Tea. Illicit drug use  Remotely quit drug use. No alcohol use  Tobacco use  Former smoker.  Family History (Ammie Eversole, LPN; 04/18/6382 5:36 PM) Breast Cancer  Family Members In General. Cancer  Sister. Cerebrovascular Accident  Sister. Colon Cancer  Brother, Father. Depression  Sister. Diabetes Mellitus  Brother. Heart Disease  Brother, Father. Hypertension  Brother. Seizure disorder  Sister.  Pregnancy / Birth History Aleatha Borer, LPN; 04/23/8030 1:22 PM) Age at menarche  66 years. Age of menopause  24-50 Contraceptive History  Oral contraceptives. Gravida  1 Maternal age  48-35 Para  0  Other Problems (Ammie Eversole, LPN; 04/24/2498 3:70 PM) Arthritis  Asthma  Breast Cancer  Chest pain  Depression  Diabetes Mellitus  Gastroesophageal Reflux Disease  High blood pressure  Hypercholesterolemia  Migraine Headache  Seizure Disorder     Review of Systems (Ammie Eversole LPN; 04/24/8889 6:94 PM) General Not Present- Appetite Loss, Chills, Fatigue, Fever, Night Sweats, Weight Gain and Weight Loss. HEENT Present- Wears glasses/contact lenses. Not Present- Earache, Hearing Loss, Hoarseness, Nose Bleed, Oral Ulcers,  Ringing in the Ears, Seasonal Allergies, Sinus Pain, Sore Throat, Visual Disturbances and Yellow Eyes. Respiratory Present- Snoring. Not Present- Bloody sputum, Chronic Cough, Difficulty Breathing and Wheezing. Breast Present- Breast Mass and Breast Pain. Not Present- Nipple Discharge and Skin Changes. Cardiovascular Present- Rapid Heart Rate. Not Present- Chest Pain, Difficulty Breathing Lying Down, Leg Cramps, Palpitations, Shortness of Breath and Swelling of Extremities. Gastrointestinal Not Present- Abdominal Pain, Bloating, Bloody Stool, Change in Bowel Habits, Chronic diarrhea, Constipation, Difficulty Swallowing, Excessive gas, Gets full quickly at meals, Hemorrhoids, Indigestion, Nausea, Rectal Pain and Vomiting. Female Genitourinary Present- Frequency. Not Present- Nocturia, Painful Urination, Pelvic Pain and Urgency. Musculoskeletal Present- Joint Pain and Joint Stiffness. Not Present- Back Pain, Muscle Pain, Muscle Weakness and Swelling of Extremities. Neurological Present- Decreased Memory, Headaches, Seizures and Trouble walking. Not Present- Fainting, Numbness, Tingling, Tremor and Weakness. Psychiatric Present- Depression. Not Present- Anxiety, Bipolar, Change in Sleep Pattern, Fearful and Frequent crying. Endocrine Present- Hair Changes. Not Present- Cold Intolerance, Excessive Hunger, Heat Intolerance, Hot flashes and New Diabetes.  Vitals (Ammie Eversole LPN; 5/0/3888 2:80 PM) 02/23/2016 4:53 PM Weight: 217.8 lb Height: 66in Body Surface Area: 2.07 m Body Mass Index: 35.15 kg/m  Temp.: 98.44F(Oral)  Pulse: 91 (Regular)  BP: 142/78 (Sitting, Left Arm, Standard)   Physical Exam  General: WN AA F alert and generally healthy appearing. She has an odd affect. She rocks a little  while I talked to her. HEENT: Normal. Pupils equal.  Neck: Supple. No mass. No thyroid mass. Lymph Nodes: No supraclavicular or cervical nodes.  Lungs: Clear to auscultation and symmetric  breath sounds. Heart: RRR. No murmur or rub.  Breasts: Right - inframammary scar. I do not feel a mass in the biopsy area. The right breast is somewhat smaller and firmer than the left.  Left - No mass or nodule.  Abdomen: Soft. No mass. No tenderness. No hernia. Normal bowel sounds.  Extremities: Good strength and ROM in upper and lower extremities.  Neurologic: Grossly intact to motor and sensory function. Psychiatric: Has normal mood and affect. Behavior is normal.   Assessment & Plan  1.  MALIGNANT NEOPLASM OF RIGHT BREAST, STAGE 1, ESTROGEN RECEPTOR POSITIVE (C50.911)  Story: Right breast biopsy 02/02/2016 ([id number]) - IDC, grade 2, ER - 100%, PR - 90%, Ki67 - 10%, Her2Neu - neg  Addendum Note(David H. Lucia Gaskins MD; 02/23/2016 7:38 PM)  Plan for 2nd right breast cancer:   1) Right mastectomy with right axillary SLNBx (she is not interested in reconstruction - I raised this several times),  2)  She saw Dr. Lindi Adie on 03/02/2016.  She raised the question of going to the [facility] ... But that is in the air  3) Will wait on genetics for now  2.  HISTORY OF RIGHT BREAST CANCER (Z85.3)  Story: Right lumpectomy and right axillary SLNBs - 01/08/2008 - 1.9 cm IDC, ER/PR positive, Her2Neu - neg, Ki67 - 15%.  Oncology - [facility]  Went to [facility] in [place] - took antiestrogen for 5 years, but does not know the name.  3. Migraines  Has seen Dr. Jaynee Eagles - the last note from Dr. Jaynee Eagles - 07/27/2015  Takes Goody powder for the pain 4. Depression  She is not seeing anyone for depression.  5. Memory issues 6. "minor" seizures - she has these several times a week  But I think that the term seizures is a self diagnosis 7. HTN  sees Dr. Quay Burow 8. DM x 5 years  On glucophage 9. On chronic disability as of Nov 2017 10. Right hip and back pain  This has been going on for about one menth 11. Left  foot issues - she did not mentiont his today  But she had a left foot/ankle MRI on 01/28/2016 - sinus tarsi syndrome?? She has seen Dr. Liana Crocker, MD, [facility] Pager: [phone] Office phone:  [phone]

## 2016-03-18 ENCOUNTER — Observation Stay (HOSPITAL_COMMUNITY)
Admission: RE | Admit: 2016-03-18 | Discharge: 2016-03-19 | Disposition: A | Discharge status: Home / Self Care | Payer: BLUE CROSS/BLUE SHIELD | Source: Ambulatory Visit | Attending: Surgery | Admitting: Surgery

## 2016-03-18 ENCOUNTER — Encounter (HOSPITAL_COMMUNITY)
Admission: RE | Admit: 2016-03-18 | Discharge: 2016-03-18 | Disposition: A | Discharge status: Home / Self Care | Payer: BLUE CROSS/BLUE SHIELD | Source: Ambulatory Visit | Attending: Surgery | Admitting: Surgery

## 2016-03-18 ENCOUNTER — Telehealth: Payer: Self-pay | Admitting: Hematology and Oncology

## 2016-03-18 ENCOUNTER — Encounter (HOSPITAL_COMMUNITY): Payer: Self-pay | Admitting: *Deleted

## 2016-03-18 ENCOUNTER — Ambulatory Visit (HOSPITAL_COMMUNITY): Payer: BLUE CROSS/BLUE SHIELD | Admitting: Anesthesiology

## 2016-03-18 ENCOUNTER — Encounter (HOSPITAL_COMMUNITY)
Admission: RE | Disposition: A | Discharge status: Home / Self Care | Payer: Self-pay | Source: Ambulatory Visit | Attending: Surgery

## 2016-03-18 ENCOUNTER — Ambulatory Visit (HOSPITAL_COMMUNITY): Payer: BLUE CROSS/BLUE SHIELD | Admitting: Vascular Surgery

## 2016-03-18 DIAGNOSIS — Z853 Personal history of malignant neoplasm of breast: Secondary | ICD-10-CM

## 2016-03-18 DIAGNOSIS — Z923 Personal history of irradiation: Secondary | ICD-10-CM | POA: Insufficient documentation

## 2016-03-18 DIAGNOSIS — M25551 Pain in right hip: Secondary | ICD-10-CM | POA: Insufficient documentation

## 2016-03-18 DIAGNOSIS — Z9889 Other specified postprocedural states: Secondary | ICD-10-CM | POA: Insufficient documentation

## 2016-03-18 DIAGNOSIS — Z6835 Body mass index (BMI) 35.0-35.9, adult: Secondary | ICD-10-CM | POA: Insufficient documentation

## 2016-03-18 DIAGNOSIS — G4710 Hypersomnia, unspecified: Secondary | ICD-10-CM | POA: Insufficient documentation

## 2016-03-18 DIAGNOSIS — C50511 Malignant neoplasm of lower-outer quadrant of right female breast: Secondary | ICD-10-CM

## 2016-03-18 DIAGNOSIS — Z7984 Long term (current) use of oral hypoglycemic drugs: Secondary | ICD-10-CM | POA: Insufficient documentation

## 2016-03-18 DIAGNOSIS — E669 Obesity, unspecified: Secondary | ICD-10-CM | POA: Insufficient documentation

## 2016-03-18 DIAGNOSIS — Z170 Estrogen receptor positive status [ER+]: Secondary | ICD-10-CM | POA: Insufficient documentation

## 2016-03-18 DIAGNOSIS — K219 Gastro-esophageal reflux disease without esophagitis: Secondary | ICD-10-CM | POA: Insufficient documentation

## 2016-03-18 DIAGNOSIS — Z86010 Personal history of colonic polyps: Secondary | ICD-10-CM | POA: Insufficient documentation

## 2016-03-18 DIAGNOSIS — E785 Hyperlipidemia, unspecified: Secondary | ICD-10-CM | POA: Insufficient documentation

## 2016-03-18 DIAGNOSIS — G43909 Migraine, unspecified, not intractable, without status migrainosus: Secondary | ICD-10-CM | POA: Insufficient documentation

## 2016-03-18 DIAGNOSIS — Z79899 Other long term (current) drug therapy: Secondary | ICD-10-CM | POA: Insufficient documentation

## 2016-03-18 DIAGNOSIS — Z87891 Personal history of nicotine dependence: Secondary | ICD-10-CM | POA: Insufficient documentation

## 2016-03-18 DIAGNOSIS — Z8709 Personal history of other diseases of the respiratory system: Secondary | ICD-10-CM | POA: Insufficient documentation

## 2016-03-18 DIAGNOSIS — D0511 Intraductal carcinoma in situ of right breast: Principal | ICD-10-CM | POA: Insufficient documentation

## 2016-03-18 DIAGNOSIS — Z803 Family history of malignant neoplasm of breast: Secondary | ICD-10-CM | POA: Insufficient documentation

## 2016-03-18 DIAGNOSIS — F329 Major depressive disorder, single episode, unspecified: Secondary | ICD-10-CM | POA: Insufficient documentation

## 2016-03-18 DIAGNOSIS — M549 Dorsalgia, unspecified: Secondary | ICD-10-CM | POA: Insufficient documentation

## 2016-03-18 DIAGNOSIS — Z8249 Family history of ischemic heart disease and other diseases of the circulatory system: Secondary | ICD-10-CM | POA: Insufficient documentation

## 2016-03-18 DIAGNOSIS — Z800 Family history of malignant neoplasm of digestive organs: Secondary | ICD-10-CM | POA: Insufficient documentation

## 2016-03-18 DIAGNOSIS — Z820 Family history of epilepsy and other diseases of the nervous system: Secondary | ICD-10-CM | POA: Insufficient documentation

## 2016-03-18 DIAGNOSIS — E7800 Pure hypercholesterolemia, unspecified: Secondary | ICD-10-CM | POA: Insufficient documentation

## 2016-03-18 DIAGNOSIS — Z818 Family history of other mental and behavioral disorders: Secondary | ICD-10-CM | POA: Insufficient documentation

## 2016-03-18 DIAGNOSIS — I10 Essential (primary) hypertension: Secondary | ICD-10-CM | POA: Insufficient documentation

## 2016-03-18 DIAGNOSIS — Z823 Family history of stroke: Secondary | ICD-10-CM | POA: Insufficient documentation

## 2016-03-18 DIAGNOSIS — M1990 Unspecified osteoarthritis, unspecified site: Secondary | ICD-10-CM | POA: Insufficient documentation

## 2016-03-18 DIAGNOSIS — E119 Type 2 diabetes mellitus without complications: Secondary | ICD-10-CM | POA: Insufficient documentation

## 2016-03-18 DIAGNOSIS — G4730 Sleep apnea, unspecified: Secondary | ICD-10-CM | POA: Insufficient documentation

## 2016-03-18 DIAGNOSIS — Z90710 Acquired absence of both cervix and uterus: Secondary | ICD-10-CM | POA: Insufficient documentation

## 2016-03-18 DIAGNOSIS — C50911 Malignant neoplasm of unspecified site of right female breast: Secondary | ICD-10-CM

## 2016-03-18 HISTORY — PX: MASTECTOMY W/ SENTINEL NODE BIOPSY: SHX2001

## 2016-03-18 LAB — GLUCOSE, CAPILLARY
Glucose-Capillary: 119 mg/dL — ABNORMAL HIGH (ref 65–99)
Glucose-Capillary: 139 mg/dL — ABNORMAL HIGH (ref 65–99)
Glucose-Capillary: 125 mg/dL — ABNORMAL HIGH (ref 65–99)
Glucose-Capillary: 150 mg/dL — ABNORMAL HIGH (ref 65–99)

## 2016-03-18 SURGERY — MASTECTOMY WITH SENTINEL LYMPH NODE BIOPSY
Anesthesia: General | Site: Breast | Laterality: Right

## 2016-03-18 MED ORDER — PROMETHAZINE HCL 25 MG/ML IJ SOLN: 6.2500 mg | INTRAMUSCULAR | Status: DC | PRN

## 2016-03-18 MED ORDER — BUPIVACAINE-EPINEPHRINE (PF) 0.5% -1:200000 IJ SOLN
INTRAMUSCULAR | Status: DC | PRN
  Administered 2016-03-18: 30 mL via PERINEURAL

## 2016-03-18 MED ORDER — MIDAZOLAM HCL 2 MG/2ML IJ SOLN
INTRAMUSCULAR | Status: AC
  Filled 2016-03-18: qty 2

## 2016-03-18 MED ORDER — MORPHINE SULFATE (PF) 2 MG/ML IV SOLN: 1.0000 mg | INTRAVENOUS | Status: DC | PRN

## 2016-03-18 MED ORDER — POTASSIUM CHLORIDE IN NACL 20-0.45 MEQ/L-% IV SOLN
INTRAVENOUS | Status: DC
  Administered 2016-03-18: 19:00:00 via INTRAVENOUS
  Filled 2016-03-18 (×2): qty 1000

## 2016-03-18 MED ORDER — TECHNETIUM TC 99M SULFUR COLLOID FILTERED
1.0000 | Freq: Once | INTRAVENOUS | Status: AC | PRN
  Administered 2016-03-18: 1 via INTRADERMAL

## 2016-03-18 MED ORDER — CHLORHEXIDINE GLUCONATE CLOTH 2 % EX PADS: 6.0000 | MEDICATED_PAD | Freq: Once | CUTANEOUS | Status: DC

## 2016-03-18 MED ORDER — LABETALOL HCL 5 MG/ML IV SOLN
5.0000 mg | Freq: Once | INTRAVENOUS | Status: AC
  Administered 2016-03-18: 5 mg via INTRAVENOUS

## 2016-03-18 MED ORDER — ACETAMINOPHEN 500 MG PO TABS
1000.0000 mg | ORAL_TABLET | ORAL | Status: AC
  Administered 2016-03-18: 1000 mg via ORAL

## 2016-03-18 MED ORDER — ACETAMINOPHEN 500 MG PO TABS
ORAL_TABLET | ORAL | Status: AC
  Filled 2016-03-18: qty 2

## 2016-03-18 MED ORDER — INSULIN ASPART 100 UNIT/ML ~~LOC~~ SOLN
0.0000 [IU] | Freq: Three times a day (TID) | SUBCUTANEOUS | Status: DC
  Administered 2016-03-19: 3 [IU] via SUBCUTANEOUS

## 2016-03-18 MED ORDER — ONDANSETRON HCL 4 MG/2ML IJ SOLN
INTRAMUSCULAR | Status: AC
  Filled 2016-03-18: qty 4

## 2016-03-18 MED ORDER — LACTATED RINGERS IV SOLN
INTRAVENOUS | Status: DC | PRN
  Administered 2016-03-18 (×2): via INTRAVENOUS

## 2016-03-18 MED ORDER — ONDANSETRON HCL 4 MG/2ML IJ SOLN: 4.0000 mg | Freq: Four times a day (QID) | INTRAMUSCULAR | Status: DC | PRN

## 2016-03-18 MED ORDER — ONDANSETRON 4 MG PO TBDP: 4.0000 mg | ORAL_TABLET | Freq: Four times a day (QID) | ORAL | Status: DC | PRN

## 2016-03-18 MED ORDER — LACTATED RINGERS IV SOLN
INTRAVENOUS | Status: DC
  Administered 2016-03-18: 08:00:00 via INTRAVENOUS

## 2016-03-18 MED ORDER — CITALOPRAM HYDROBROMIDE 20 MG PO TABS
20.0000 mg | ORAL_TABLET | Freq: Every day | ORAL | Status: DC
  Administered 2016-03-19: 20 mg via ORAL
  Filled 2016-03-18: qty 1

## 2016-03-18 MED ORDER — METFORMIN HCL 500 MG PO TABS
1000.0000 mg | ORAL_TABLET | Freq: Two times a day (BID) | ORAL | Status: DC
  Administered 2016-03-18 – 2016-03-19 (×2): 1000 mg via ORAL
  Filled 2016-03-18 (×2): qty 2

## 2016-03-18 MED ORDER — FENTANYL CITRATE (PF) 100 MCG/2ML IJ SOLN
INTRAMUSCULAR | Status: AC
  Filled 2016-03-18: qty 2

## 2016-03-18 MED ORDER — FENTANYL CITRATE (PF) 100 MCG/2ML IJ SOLN
50.0000 ug | Freq: Once | INTRAMUSCULAR | Status: AC
  Administered 2016-03-18: 50 ug via INTRAVENOUS

## 2016-03-18 MED ORDER — CEFAZOLIN SODIUM-DEXTROSE 2-4 GM/100ML-% IV SOLN
INTRAVENOUS | Status: AC
  Filled 2016-03-18: qty 100

## 2016-03-18 MED ORDER — ONDANSETRON HCL 4 MG/2ML IJ SOLN
INTRAMUSCULAR | Status: DC | PRN
  Administered 2016-03-18: 4 mg via INTRAVENOUS

## 2016-03-18 MED ORDER — PHENYLEPHRINE HCL 10 MG/ML IJ SOLN
INTRAMUSCULAR | Status: DC | PRN
  Administered 2016-03-18: 20 ug/min via INTRAVENOUS

## 2016-03-18 MED ORDER — IBUPROFEN 600 MG PO TABS: 600.0000 mg | ORAL_TABLET | Freq: Four times a day (QID) | ORAL | Status: DC | PRN

## 2016-03-18 MED ORDER — PROPOFOL 10 MG/ML IV BOLUS
INTRAVENOUS | Status: AC
  Filled 2016-03-18: qty 20

## 2016-03-18 MED ORDER — PHENYLEPHRINE 40 MCG/ML (10ML) SYRINGE FOR IV PUSH (FOR BLOOD PRESSURE SUPPORT)
PREFILLED_SYRINGE | INTRAVENOUS | Status: AC
  Filled 2016-03-18: qty 20

## 2016-03-18 MED ORDER — ZONISAMIDE 25 MG PO CAPS
50.0000 mg | ORAL_CAPSULE | Freq: Every day | ORAL | Status: DC | PRN
  Filled 2016-03-18: qty 2

## 2016-03-18 MED ORDER — PROPOFOL 10 MG/ML IV BOLUS
INTRAVENOUS | Status: DC | PRN
Start: 2016-03-18 — End: 2016-03-18
  Administered 2016-03-18: 200 mg via INTRAVENOUS

## 2016-03-18 MED ORDER — HYDROCODONE-ACETAMINOPHEN 5-325 MG PO TABS
1.0000 | ORAL_TABLET | ORAL | Status: DC | PRN
  Administered 2016-03-18 – 2016-03-19 (×3): 2 via ORAL
  Filled 2016-03-18 (×3): qty 2

## 2016-03-18 MED ORDER — PHENYLEPHRINE HCL 10 MG/ML IJ SOLN
INTRAMUSCULAR | Status: DC | PRN
  Administered 2016-03-18: 120 ug via INTRAVENOUS
  Administered 2016-03-18: 80 ug via INTRAVENOUS

## 2016-03-18 MED ORDER — GABAPENTIN 300 MG PO CAPS
ORAL_CAPSULE | ORAL | Status: AC
  Filled 2016-03-18: qty 1

## 2016-03-18 MED ORDER — SODIUM CHLORIDE 0.9 % IJ SOLN
INTRAMUSCULAR | Status: AC
  Filled 2016-03-18: qty 10

## 2016-03-18 MED ORDER — SODIUM CHLORIDE 0.9 % IJ SOLN
INTRAVENOUS | Status: DC | PRN
  Administered 2016-03-18: 1 mL

## 2016-03-18 MED ORDER — IRBESARTAN 300 MG PO TABS
300.0000 mg | ORAL_TABLET | Freq: Every day | ORAL | Status: DC
  Administered 2016-03-19: 300 mg via ORAL
  Filled 2016-03-18: qty 1

## 2016-03-18 MED ORDER — GABAPENTIN 300 MG PO CAPS
300.0000 mg | ORAL_CAPSULE | ORAL | Status: AC
  Administered 2016-03-18: 300 mg via ORAL

## 2016-03-18 MED ORDER — METHYLENE BLUE 0.5 % INJ SOLN
INTRAVENOUS | Status: AC
  Filled 2016-03-18: qty 10

## 2016-03-18 MED ORDER — 0.9 % SODIUM CHLORIDE (POUR BTL) OPTIME
TOPICAL | Status: DC | PRN
  Administered 2016-03-18: 1000 mL

## 2016-03-18 MED ORDER — HEPARIN SODIUM (PORCINE) 5000 UNIT/ML IJ SOLN
5000.0000 [IU] | Freq: Three times a day (TID) | INTRAMUSCULAR | Status: DC
  Administered 2016-03-18 – 2016-03-19 (×2): 5000 [IU] via SUBCUTANEOUS
  Filled 2016-03-18 (×2): qty 1

## 2016-03-18 MED ORDER — CEFAZOLIN SODIUM-DEXTROSE 2-4 GM/100ML-% IV SOLN
2.0000 g | INTRAVENOUS | Status: AC
  Administered 2016-03-18: 2 g via INTRAVENOUS

## 2016-03-18 MED ORDER — CHLORTHALIDONE 25 MG PO TABS
25.0000 mg | ORAL_TABLET | Freq: Every day | ORAL | Status: DC
  Administered 2016-03-19: 25 mg via ORAL
  Filled 2016-03-18: qty 1

## 2016-03-18 MED ORDER — HYDROMORPHONE HCL 1 MG/ML IJ SOLN: 0.2500 mg | INTRAMUSCULAR | Status: DC | PRN

## 2016-03-18 MED ORDER — LACTATED RINGERS IV SOLN: INTRAVENOUS | Status: DC

## 2016-03-18 MED ORDER — DEXAMETHASONE SODIUM PHOSPHATE 10 MG/ML IJ SOLN
INTRAMUSCULAR | Status: AC
  Filled 2016-03-18: qty 1

## 2016-03-18 MED ORDER — MIDAZOLAM HCL 2 MG/2ML IJ SOLN
2.0000 mg | Freq: Once | INTRAMUSCULAR | Status: AC
  Administered 2016-03-18: 2 mg via INTRAVENOUS

## 2016-03-18 MED ORDER — LABETALOL HCL 5 MG/ML IV SOLN
INTRAVENOUS | Status: AC
  Filled 2016-03-18: qty 4

## 2016-03-18 MED ORDER — ARTIFICIAL TEARS OP OINT
TOPICAL_OINTMENT | OPHTHALMIC | Status: AC
  Filled 2016-03-18: qty 3.5

## 2016-03-18 MED ORDER — MEPERIDINE HCL 25 MG/ML IJ SOLN: 6.2500 mg | INTRAMUSCULAR | Status: DC | PRN

## 2016-03-18 MED ORDER — LIDOCAINE HCL (CARDIAC) 20 MG/ML IV SOLN
INTRAVENOUS | Status: DC | PRN
  Administered 2016-03-18: 60 mg via INTRAVENOUS

## 2016-03-18 MED ORDER — HYDRALAZINE HCL 50 MG PO TABS
100.0000 mg | ORAL_TABLET | Freq: Three times a day (TID) | ORAL | Status: DC
  Administered 2016-03-18 – 2016-03-19 (×3): 100 mg via ORAL
  Filled 2016-03-18 (×3): qty 2

## 2016-03-18 MED ORDER — FENTANYL CITRATE (PF) 100 MCG/2ML IJ SOLN
INTRAMUSCULAR | Status: DC | PRN
  Administered 2016-03-18 (×2): 50 ug via INTRAVENOUS
  Administered 2016-03-18 (×2): 25 ug via INTRAVENOUS

## 2016-03-18 MED ORDER — LIDOCAINE 2% (20 MG/ML) 5 ML SYRINGE
INTRAMUSCULAR | Status: AC
  Filled 2016-03-18: qty 15

## 2016-03-18 MED ORDER — FENTANYL CITRATE (PF) 100 MCG/2ML IJ SOLN
INTRAMUSCULAR | Status: AC
  Filled 2016-03-18: qty 4

## 2016-03-18 SURGICAL SUPPLY — 52 items
APPLIER CLIP 9.375 MED OPEN (MISCELLANEOUS) ×2
ATCH SMKEVC FLXB CAUT HNDSWH (FILTER) ×1 IMPLANT
BINDER BREAST LRG (GAUZE/BANDAGES/DRESSINGS) IMPLANT
BINDER BREAST XLRG (GAUZE/BANDAGES/DRESSINGS) ×2 IMPLANT
CANISTER SUCT 3000ML PPV (MISCELLANEOUS) ×4 IMPLANT
CHLORAPREP W/TINT 26ML (MISCELLANEOUS) ×2 IMPLANT
CLIP APPLIE 9.375 MED OPEN (MISCELLANEOUS) ×1 IMPLANT
CONT SPEC 4OZ CLIKSEAL STRL BL (MISCELLANEOUS) ×2 IMPLANT
COVER PROBE W GEL 5X96 (DRAPES) ×2 IMPLANT
COVER SURGICAL LIGHT HANDLE (MISCELLANEOUS) ×2 IMPLANT
DERMABOND ADVANCED (GAUZE/BANDAGES/DRESSINGS) ×1
DERMABOND ADVANCED .7 DNX12 (GAUZE/BANDAGES/DRESSINGS) ×1 IMPLANT
DEVICE DISSECT PLASMABLAD 3.0S (MISCELLANEOUS) IMPLANT
DRAIN CHANNEL 19F RND (DRAIN) ×2 IMPLANT
DRAPE CHEST BREAST 15X10 FENES (DRAPES) ×2 IMPLANT
DRAPE PROXIMA HALF (DRAPES) ×2 IMPLANT
ELECT CAUTERY BLADE 6.4 (BLADE) ×2 IMPLANT
ELECT REM PT RETURN 9FT ADLT (ELECTROSURGICAL) ×4
ELECTRODE REM PT RTRN 9FT ADLT (ELECTROSURGICAL) ×2 IMPLANT
EVACUATOR SILICONE 100CC (DRAIN) ×2 IMPLANT
EVACUATOR SMOKE ACCUVAC VALLEY (FILTER) ×1
GAUZE SPONGE 4X4 12PLY STRL (GAUZE/BANDAGES/DRESSINGS) ×2 IMPLANT
GLOVE SURG SIGNA 7.5 PF LTX (GLOVE) ×2 IMPLANT
GOWN STRL REUS W/ TWL LRG LVL3 (GOWN DISPOSABLE) ×1 IMPLANT
GOWN STRL REUS W/ TWL XL LVL3 (GOWN DISPOSABLE) ×1 IMPLANT
GOWN STRL REUS W/TWL LRG LVL3 (GOWN DISPOSABLE) ×1
GOWN STRL REUS W/TWL XL LVL3 (GOWN DISPOSABLE) ×1
ILLUMINATOR WAVEGUIDE N/F (MISCELLANEOUS) IMPLANT
KIT BASIN OR (CUSTOM PROCEDURE TRAY) ×2 IMPLANT
KIT ROOM TURNOVER OR (KITS) ×2 IMPLANT
LIGHT WAVEGUIDE WIDE FLAT (MISCELLANEOUS) IMPLANT
MARKER SKIN DUAL TIP RULER LAB (MISCELLANEOUS) ×2 IMPLANT
NEEDLE 18GX1X1/2 (RX/OR ONLY) (NEEDLE) IMPLANT
NEEDLE FILTER BLUNT 18X 1/2SAF (NEEDLE)
NEEDLE FILTER BLUNT 18X1 1/2 (NEEDLE) IMPLANT
NEEDLE HYPO 25GX1X1/2 BEV (NEEDLE) IMPLANT
NS IRRIG 1000ML POUR BTL (IV SOLUTION) ×2 IMPLANT
PACK GENERAL/GYN (CUSTOM PROCEDURE TRAY) ×2 IMPLANT
PAD ABD 8X10 STRL (GAUZE/BANDAGES/DRESSINGS) ×4 IMPLANT
PAD ARMBOARD 7.5X6 YLW CONV (MISCELLANEOUS) ×2 IMPLANT
PLASMABLADE 3.0S (MISCELLANEOUS)
SPECIMEN JAR X LARGE (MISCELLANEOUS) ×2 IMPLANT
SPONGE GAUZE 4X4 12PLY STER LF (GAUZE/BANDAGES/DRESSINGS) ×2 IMPLANT
STAPLER VISISTAT 35W (STAPLE) IMPLANT
SUT ETHILON 2 0 FS 18 (SUTURE) ×2 IMPLANT
SUT MNCRL AB 4-0 PS2 18 (SUTURE) ×2 IMPLANT
SUT SILK 2 0 FS (SUTURE) ×2 IMPLANT
SUT VIC AB 3-0 SH 18 (SUTURE) ×2 IMPLANT
SYR CONTROL 10ML LL (SYRINGE) IMPLANT
TOWEL OR 17X24 6PK STRL BLUE (TOWEL DISPOSABLE) ×2 IMPLANT
TOWEL OR 17X26 10 PK STRL BLUE (TOWEL DISPOSABLE) ×2 IMPLANT
TUBE CONNECTING 12X1/4 (SUCTIONS) ×2 IMPLANT

## 2016-03-18 NOTE — Transfer of Care (Signed)
Immediate Anesthesia Transfer of Care Note  Patient: Carrie Singh  Procedure(s) Performed: Procedure(s): RIGHT MASTECTOMY WITH RIGHT AXILLARY SENTINEL LYMPH NODE BIOPSY (Right)  Patient Location: PACU  Anesthesia Type:General  Level of Consciousness: awake, alert  and oriented  Airway & Oxygen Therapy: Patient Spontanous Breathing and Patient connected to nasal cannula oxygen  Post-op Assessment: Report given to RN, Post -op Vital signs reviewed and stable and Patient moving all extremities X 4  Post vital signs: Reviewed and stable  Last Vitals:  Vitals:   03/18/16 0845 03/18/16 0850  BP: (!) 153/79   Pulse: 84 89  Resp: (!) 23 15  Temp:      Last Pain:  Vitals:   03/18/16 0744  TempSrc: Oral         Complications: No apparent anesthesia complications

## 2016-03-18 NOTE — Anesthesia Procedure Notes (Signed)
Anesthesia Regional Block: Pectoralis block   Pre-Anesthetic Checklist: ,, timeout performed, Correct Patient, Correct Site, Correct Laterality, Correct Procedure, Correct Position, site marked, Risks and benefits discussed,  Surgical consent,  Pre-op evaluation,  At surgeon's request and post-op pain management  Laterality: Right  Prep: chloraprep       Needles:  Injection technique: Single-shot  Needle Type: Echogenic Needle     Needle Length: 9cm  Needle Gauge: 21     Additional Needles:   Procedures: ultrasound guided,,,,,,,,  Narrative:  Start time: 03/18/2016 8:35 AM End time: 03/18/2016 8:40 AM Injection made incrementally with aspirations every 5 mL.  Performed by: Personally  Anesthesiologist: Suella Broad D  Additional Notes: Pt tolerated well.

## 2016-03-18 NOTE — Anesthesia Preprocedure Evaluation (Addendum)
Anesthesia Evaluation  Patient identified by MRN, date of birth, ID band Patient awake    Reviewed: Allergy & Precautions, NPO status , Patient's Chart, lab work & pertinent test results  Airway Mallampati: I  TM Distance: >3 FB Neck ROM: full    Dental  (+) Teeth Intact, Dental Advidsory Given   Pulmonary    breath sounds clear to auscultation       Cardiovascular hypertension, Pt. on medications  Rhythm:regular Rate:Normal     Neuro/Psych  Headaches, Seizures -,  PSYCHIATRIC DISORDERS Depression    GI/Hepatic Neg liver ROS, GERD  Medicated and Controlled,  Endo/Other  diabetes, Type 2, Oral Hypoglycemic Agents  Renal/GU negative Renal ROS  negative genitourinary   Musculoskeletal negative musculoskeletal ROS (+)   Abdominal   Peds negative pediatric ROS (+)  Hematology negative hematology ROS (+)   Anesthesia Other Findings   Reproductive/Obstetrics negative OB ROS                           Lab Results  Component Value Date   WBC 4.8 03/15/2016   HGB 13.0 03/15/2016   HCT 39.8 03/15/2016   MCV 87.3 03/15/2016   PLT 255 03/15/2016   Lab Results  Component Value Date   CREATININE 1.24 (H) 03/15/2016   BUN 18 03/15/2016   NA 139 03/15/2016   K 3.5 03/15/2016   CL 101 03/15/2016   CO2 27 03/15/2016   No results found for: INR, PROTIME  EKG: normal sinus rhythm, RBBB.  Echo: - Left ventricle: The cavity size was normal. Systolic function was   normal. The estimated ejection fraction was in the range of 50%   to 55%. Mild to moderate Inferior and posterior wall hypokinesis.   Doppler parameters are consistent with abnormal left ventricular   relaxation (grade 1 diastolic dysfunction). - Left atrium: The atrium was mildly dilated. - Right ventricle: Systolic function was normal. - Pulmonary arteries: Systolic pressure was within the normal   range.  Anesthesia  Physical Anesthesia Plan  ASA: II  Anesthesia Plan: General   Post-op Pain Management: GA combined w/ Regional for post-op pain   Induction: Intravenous  Airway Management Planned: LMA  Additional Equipment:   Intra-op Plan:   Post-operative Plan: Extubation in OR  Informed Consent: I have reviewed the patients History and Physical, chart, labs and discussed the procedure including the risks, benefits and alternatives for the proposed anesthesia with the patient or authorized representative who has indicated his/her understanding and acceptance.   Dental advisory given and Dental Advisory Given  Plan Discussed with: CRNA, Anesthesiologist and Surgeon  Anesthesia Plan Comments:        Anesthesia Quick Evaluation

## 2016-03-18 NOTE — Op Note (Signed)
03/18/2016  10:58 AM  PATIENT:  Carrie Singh, 61 y.o., female, MRN: [id number]  PREOP DIAGNOSIS:  RIGHT BREAST CANCER  POSTOP DIAGNOSIS:   Right breast cancer, 6 o'clock position (T1, N0)  PROCEDURE:   Procedure(s): RIGHT MASTECTOMY WITH RIGHT AXILLARY LYMPH NODE BIOPSY , deep sentinel lymph node biopsy  SURGEON:   Alphonsa Overall, M.D.  ASSISTANT:   Arlana Lindau, PA student  ANESTHESIA:   general  Anesthesiologist: Effie Berkshire, MD CRNA: Neldon Newport, CRNA; Oletta Lamas, CRNA  General  ASA:  2  EBL:  75  ml  BLOOD ADMINISTERED: none  DRAINS: none   LOCAL MEDICATIONS USED:   Right pectoral block by anesthesia  SPECIMEN:   Right breast (suture lateral), superior skin flap (short suture caudad, long suture lateral), right axillary lymph node biopsy  COUNTS CORRECT:  YES  INDICATIONS FOR PROCEDURE:  Carrie Singh is a 61 y.o. (DOB: 1955/02/07) AA female whose primary care physician is Webb Silversmith, NP and comes for right mastectomy with right axillary sentinel lymph node biopsy.      Ms. Phair had a prior right breast cancer treated in 2009. She had a right lumpectomy, right axillary SLNBx (3 nodes), radiation tx, and antihormone therapy for 5 years.  She now has a new right breast cancer at the 6 o'clock position of the right breast.  We discussed mastectomy.  She is not interested in reconstruction.  We will try to find a SLNBx,but with her prior history, this may be difficult.   The indications and risks of the surgery were explained to the patient.  The risks include, but are not limited to, infection, bleeding, and nerve injury.  OPERATIVE NOTE;  The patient was taken to room # 2 at [facility] where she underwent a general anesthesia  supervised by Anesthesiologist: Effie Berkshire, MD CRNA: Neldon Newport, CRNA; Oletta Lamas, CRNA. Her right breast and axilla were prepped with ChloraPrep and sterilely draped.    A time-out and the surgical check list  was reviewed.    I injected about 1.0 mL of 40% methylene blue around her right areola.   I made an elliptical incision including the areola in the right breast.  I developed skin flaps medially to the lateral edge of the sternum, inferiorly to the investing fascia of the rectus abdominus muscle, laterally to the anterior edge of the latissimus dorsi muscle, and superiorly to about 2 finger breaths below the clavicle.  The breast was reflected off the pectoralis muscle from medial to lateral.  The lateral attachments in the right axilla were divided and the breast removed.  A long suture was placed on the lateral aspect of the breast.    I dissected into the right axilla.  I could find no blue, nor detect any counts in the right axilla.  I removed the lower fat pad of the axilla which I think contains at least one or two nodes.  I could feel nothing else suspicious in her axilla.   I brought out 2 17 F Blake drains below the inferior flaps.  These were sewn in place with 2-0 Nylons.  I irrigated the wound with 2,000 cc of fluid.   The skin was closed with interrupted 3-0 Vicryl sutures and the skin was closed with a 4-0 Monocryl.  The wound was painted with Dermabond.    A pressure dressing was placed on the wound and the chest wrapped with a breast binder.  Her needle  and sponge count were correct at the end of the case.   She was transferred to the recovery room in good condition.  Alphonsa Overall, MD, [facility] Pager: [phone] Office phone:  [phone]

## 2016-03-18 NOTE — Telephone Encounter (Signed)
Faxed disability paperwork to [facility] fax [phone]

## 2016-03-18 NOTE — Interval H&P Note (Signed)
History and Physical Interval Note:  03/18/2016 8:42 AM  Carrie Singh  has presented today for surgery, with the diagnosis of RIGHT BREAST CANCER  The various methods of treatment have been discussed with the patient and family.  Husband at bedside.  After consideration of risks, benefits and other options for treatment, the patient has consented to  Procedure(s): RIGHT MASTECTOMY WITH RIGHT AXILLARY SENTINEL LYMPH NODE BIOPSY (Right) as a surgical intervention .  The patient's history has been reviewed, patient examined, no change in status, stable for surgery.  I have reviewed the patient's chart and labs.  Questions were answered to the patient's satisfaction.     Jasper Hanf H

## 2016-03-18 NOTE — Anesthesia Procedure Notes (Signed)
Procedure Name: LMA Insertion Date/Time: 03/18/2016 3:14 AM Performed by: Neldon Newport Pre-anesthesia Checklist: Timeout performed, Patient being monitored, Suction available, Emergency Drugs available and Patient identified Patient Re-evaluated:Patient Re-evaluated prior to inductionOxygen Delivery Method: Circle system utilized Preoxygenation: Pre-oxygenation with 100% oxygen Intubation Type: IV induction Ventilation: Mask ventilation without difficulty LMA: LMA inserted LMA Size: 4.0 Number of attempts: 1 Placement Confirmation: positive ETCO2 Tube secured with: Tape Dental Injury: Teeth and Oropharynx as per pre-operative assessment

## 2016-03-18 NOTE — Anesthesia Postprocedure Evaluation (Addendum)
Anesthesia Post Note  Patient: Sheneice Brockschmidt  Procedure(s) Performed: Procedure(s) (LRB): RIGHT MASTECTOMY WITH RIGHT AXILLARY SENTINEL LYMPH NODE BIOPSY (Right)  Patient location during evaluation: PACU Anesthesia Type: General Level of consciousness: awake and alert Pain management: pain level controlled Vital Signs Assessment: post-procedure vital signs reviewed and stable Respiratory status: spontaneous breathing, nonlabored ventilation, respiratory function stable and patient connected to nasal cannula oxygen Cardiovascular status: blood pressure returned to baseline and stable Postop Assessment: no signs of nausea or vomiting Anesthetic complications: no       Last Vitals:  Vitals:   03/18/16 1245 03/18/16 1336  BP: (!) 159/96 (!) 159/94  Pulse:  89  Resp:  20  Temp:  36.8 C    Last Pain:  Vitals:   03/18/16 1336  TempSrc: Oral  PainSc:                  Effie Berkshire

## 2016-03-19 ENCOUNTER — Encounter (HOSPITAL_COMMUNITY): Payer: Self-pay | Admitting: Surgery

## 2016-03-19 DIAGNOSIS — D0511 Intraductal carcinoma in situ of right breast: Secondary | ICD-10-CM | POA: Diagnosis not present

## 2016-03-19 LAB — GLUCOSE, CAPILLARY
Glucose-Capillary: 170 mg/dL — ABNORMAL HIGH (ref 65–99)
Glucose-Capillary: 154 mg/dL — ABNORMAL HIGH (ref 65–99)

## 2016-03-19 MED ORDER — HYDROCODONE-ACETAMINOPHEN 5-325 MG PO TABS: 1.0000 | ORAL_TABLET | ORAL | 0 refills | Status: DC | PRN

## 2016-03-19 MED ORDER — IBUPROFEN 600 MG PO TABS: 600.0000 mg | ORAL_TABLET | Freq: Four times a day (QID) | ORAL | 1 refills | Status: DC | PRN

## 2016-03-19 NOTE — Discharge Instructions (Signed)
[facility] - DISCHARGE INSTRUCTIONS TO PATIENT  Activity:  Driving - May drive in 3 or 4 days, if doing well   Lifting - No lifting more than 15 pounds for 1 weeks, then no limit  Wound Care:   Empty drains twice a day and record the amount.        You may get in the shower on Monday.  Redress wound after shower.        Wear breast binder  Diet:  As tolerated  Follow up appointment:  Call Dr. Pollie Friar office [facility]) at [phone] for an appointment in next week.  Medications and dosages:  Resume your home medications.  You have a prescription for:  Vicodin  Call Dr. Lucia Gaskins or his office  [phone]) if you have:  Temperature greater than 100.4,  Persistent nausea and vomiting,  Severe uncontrolled pain,  Redness, tenderness, or signs of infection (pain, swelling, redness, odor or green/yellow discharge around the site),  Any other questions or concerns you may have after discharge.  In an emergency, call 911 or go to an Emergency Department at a nearby hospital.

## 2016-03-19 NOTE — Discharge Planning (Signed)
Patient discharged home in stable condition. Pt and husband verbalize understanding of all discharge instructions, including home medications, JP drain care and follow up appointments.

## 2016-03-19 NOTE — Discharge Summary (Signed)
Physician Discharge Summary  Patient ID: Carrie Singh MRN: [id number] DOB/AGE: 08/08/1955 61 y.o.  Admit date: 03/18/2016 Discharge date: 03/19/2016  Admission Diagnoses: Patient Active Problem List   Diagnosis Date Noted  . Intractable episodic cluster headache 12/04/2014  . Depression with somatization 12/04/2014  . Hypersomnia with sleep apnea 12/04/2014  . Obesity (BMI 30-39.9) 04/24/2013  . HTN (hypertension) 04/24/2013  . HLD (hyperlipidemia) 04/24/2013  . DM2 (diabetes mellitus, type 2) ([place]) 04/24/2013  . Malignant neoplasm of lower-outer quadrant of right breast of female, estrogen receptor positive ([place]) 12/18/2007    Discharge Diagnoses:  Active Problems:   Malignant neoplasm of lower-outer quadrant of right breast of female, estrogen receptor positive ([place]) same  Discharged Condition: stable  Hospital Course: Pt was admitted to the floor following a right mastectomy with sentinel lymph node biopsy.  She was able to ambulate.  She had reasonable PO intake.  Pain control was OK with oral meds.  No evidence of any sizable hematoma on POD 1.  Hemodynamically stable.  Pt discharged to home after drain teaching.    Consults: None  Significant Diagnostic Studies: n/a  Treatments: surgery: see above  Discharge Exam: Blood pressure (!) 111/58, pulse 76, temperature 98.2 F (36.8 C), temperature source Oral, resp. rate 16, height 5' 5.5" (1.664 m), weight 96.6 kg (213 lb), SpO2 98 %. General appearance: alert, cooperative and mild distress Resp: breathing comfortably Chest wall: right sided chest wall tenderness, as expected.  small hematoma Extremities: extremities normal, atraumatic, no cyanosis or edema  Disposition: 01-Home or Self Care  Discharge Instructions    Call MD for:  persistant dizziness or light-headedness    Complete by:  As directed    Call MD for:  persistant nausea and vomiting    Complete by:  As directed    Call MD for:  redness, tenderness, or  signs of infection (pain, swelling, redness, odor or green/yellow discharge around incision site)    Complete by:  As directed    Call MD for:  severe uncontrolled pain    Complete by:  As directed    Call MD for:  temperature >100.4    Complete by:  As directed    Diet - low sodium heart healthy    Complete by:  As directed    Increase activity slowly    Complete by:  As directed      Allergies as of 03/19/2016      Reactions   No Known Allergies       Medication List    STOP taking these medications   predniSONE 10 MG tablet Commonly known as:  DELTASONE     TAKE these medications   chlorthalidone 25 MG tablet Commonly known as:  HYGROTON TAKE 1 TABLET (25 MG TOTAL) BY MOUTH DAILY.   citalopram 20 MG tablet Commonly known as:  CELEXA TAKE 1 TABLET (20 MG TOTAL) BY MOUTH DAILY.   GOODY HEADACHE PO Take 1 Package by mouth 3 (three) times daily as needed (migraine).   hydrALAZINE 100 MG tablet Commonly known as:  APRESOLINE TAKE 1 TABLET (100 MG TOTAL) BY MOUTH 3 (THREE) TIMES DAILY.   HYDROcodone-acetaminophen 5-325 MG tablet Commonly known as:  NORCO/VICODIN Take 1-2 tablets by mouth every 4 (four) hours as needed for moderate pain.   ibuprofen 600 MG tablet Commonly known as:  ADVIL,MOTRIN Take 1 tablet (600 mg total) by mouth every 6 (six) hours as needed for mild pain.   irbesartan 300 MG tablet Commonly known  as:  AVAPRO Take 300 mg by mouth daily.   metFORMIN 1000 MG tablet Commonly known as:  GLUCOPHAGE TAKE 1 TABLET BY MOUTH TWICE A DAY WITH A MEAL   pravastatin 40 MG tablet Commonly known as:  PRAVACHOL Take 1 tablet (40 mg total) by mouth daily.   zonisamide 50 MG capsule Commonly known as:  ZONEGRAN Take 50 mg by mouth daily as needed (migraine).      Follow-up Information    NEWMAN,DAVID H, MD Follow up in 2 week(s).   Specialty:  General Surgery Why:  1-2 weeks.   Contact information: [address] [place]  [address] [phone]           Signed: Stark Klein 03/19/2016, 10:17 AM

## 2016-03-21 ENCOUNTER — Telehealth: Payer: Self-pay | Admitting: Hematology and Oncology

## 2016-03-21 NOTE — Telephone Encounter (Signed)
Patient has an appointment with Dr Lucia Gaskins at 9:45 and needs to move her appointment with Dr Lindi Adie after that   [phone]

## 2016-03-25 ENCOUNTER — Encounter: Payer: Self-pay | Admitting: Hematology and Oncology

## 2016-03-25 ENCOUNTER — Ambulatory Visit (HOSPITAL_BASED_OUTPATIENT_CLINIC_OR_DEPARTMENT_OTHER): Payer: BLUE CROSS/BLUE SHIELD | Admitting: Hematology and Oncology

## 2016-03-25 ENCOUNTER — Ambulatory Visit: Payer: BLUE CROSS/BLUE SHIELD | Admitting: Hematology and Oncology

## 2016-03-25 DIAGNOSIS — C50511 Malignant neoplasm of lower-outer quadrant of right female breast: Secondary | ICD-10-CM

## 2016-03-25 DIAGNOSIS — Z170 Estrogen receptor positive status [ER+]: Secondary | ICD-10-CM

## 2016-03-25 NOTE — Assessment & Plan Note (Signed)
Right simple mastectomy: Grade 2 IDC with DCIS 1.5 cm, margins -0/3 lymph nodes, T1b N0 stage IA, ER 100%, PR 90%, Ki-67 10%, HER-2 negative ratio 1.28  Pathology counseling: I discussed the final pathology report of the patient provided  a copy of this report. I discussed the margins as well as lymph node surgeries. We also discussed the final staging along with previously performed ER/PR and HER-2/neu testing.  Recommendation: 1. Oncotype DX testing to determine if she would benefit from chemotherapy 2. followed by adjuvant antiestrogen therapy  Return to clinic based upon Oncotype DX score result

## 2016-03-25 NOTE — Progress Notes (Signed)
Patient Care Team: Jearld Fenton, NP as PCP - General (Internal Medicine) Minna Merritts, MD as Consulting Physician (Cardiology) Nicholas Lose, MD as Consulting Physician (Hematology and Oncology)  DIAGNOSIS:  Encounter Diagnosis  Name Primary?  . Malignant neoplasm of lower-outer quadrant of right breast of female, estrogen receptor positive ([place])     SUMMARY OF ONCOLOGIC HISTORY:   Malignant neoplasm of lower-outer quadrant of right breast of female, estrogen receptor positive ([place])   12/18/2007 Initial Diagnosis    Right breast cancer treated with lumpectomy followed by radiation and 5 years of antiestrogen therapy with aromatase inhibitor      02/02/2016 Relapse/Recurrence    Right breast biopsy 6:00 position: IDC with DCIS grade 2, ER 100%, PR 90%, Ki-67 10%, HER-2 negative ratio 1.28, screening detected right breast lumps 0.5 cm and 0.3 cm, T1a N0 stage IA clinical stage      03/18/2016 Surgery    Right simple mastectomy: Grade 2 IDC with DCIS 1.5 cm, margins -0/3 lymph nodes, T1b N0 stage IA, ER 100%, PR 90%, Ki-67 10%, HER-2 negative ratio 1.28       CHIEF COMPLIANT: Follow-up after recent mastectomy  INTERVAL HISTORY: Carrie Singh is a 62 year old with above-mentioned history of right breast cancer treated with mastectomy and is here for a hollow after surgery. She is very sore from the surgery. But she is managing. Denies any fevers or chills. Takes pain medication for pain relief. She is accompanied by her husband.  REVIEW OF SYSTEMS:   Constitutional: Denies fevers, chills or abnormal weight loss Eyes: Denies blurriness of vision Ears, nose, mouth, throat, and face: Denies mucositis or sore throat Respiratory: Denies cough, dyspnea or wheezes Cardiovascular: Denies palpitation, chest discomfort Gastrointestinal:  Denies nausea, heartburn or change in bowel habits Skin: Denies abnormal skin rashes Lymphatics: Denies new lymphadenopathy or easy  bruising Neurological:Denies numbness, tingling or new weaknesses Behavioral/Psych: Mood is stable, no new changes  Extremities: No lower extremity edema Breast: Recent right mastectomy with drainage tubes All other systems were reviewed with the patient and are negative.  I have reviewed the past medical history, past surgical history, social history and family history with the patient and they are unchanged from previous note.  ALLERGIES:  is allergic to no known allergies.  MEDICATIONS:  Current Outpatient Prescriptions  Medication Sig Dispense Refill  . Aspirin-Acetaminophen-Caffeine (GOODY HEADACHE PO) Take 1 Package by mouth 3 (three) times daily as needed (migraine).    . chlorthalidone (HYGROTON) 25 MG tablet TAKE 1 TABLET (25 MG TOTAL) BY MOUTH DAILY. 30 tablet 4  . citalopram (CELEXA) 20 MG tablet TAKE 1 TABLET (20 MG TOTAL) BY MOUTH DAILY. 30 tablet 6  . hydrALAZINE (APRESOLINE) 100 MG tablet TAKE 1 TABLET (100 MG TOTAL) BY MOUTH 3 (THREE) TIMES DAILY. 270 tablet 0  . HYDROcodone-acetaminophen (NORCO/VICODIN) 5-325 MG tablet Take 1-2 tablets by mouth every 4 (four) hours as needed for moderate pain. 30 tablet 0  . ibuprofen (ADVIL,MOTRIN) 600 MG tablet Take 1 tablet (600 mg total) by mouth every 6 (six) hours as needed for mild pain. 30 tablet 1  . irbesartan (AVAPRO) 300 MG tablet Take 300 mg by mouth daily.     . metFORMIN (GLUCOPHAGE) 1000 MG tablet TAKE 1 TABLET BY MOUTH TWICE A DAY WITH A MEAL 60 tablet 3  . pravastatin (PRAVACHOL) 40 MG tablet Take 1 tablet (40 mg total) by mouth daily. 30 tablet 5  . zonisamide (ZONEGRAN) 50 MG capsule Take 50 mg by  mouth daily as needed (migraine).      No current facility-administered medications for this visit.     PHYSICAL EXAMINATION: ECOG PERFORMANCE STATUS: 2 - Symptomatic, <50% confined to bed  Vitals:   03/25/16 0838  BP: 134/64  Pulse: 100  Resp: 18  Temp: 98.1 F (36.7 C)   Filed Weights   03/25/16 0838  Weight:  219 lb 3.2 oz (99.4 kg)    GENERAL:alert, no distress and comfortable SKIN: skin color, texture, turgor are normal, no rashes or significant lesions EYES: normal, Conjunctiva are pink and non-injected, sclera clear OROPHARYNX:no exudate, no erythema and lips, buccal mucosa, and tongue normal  NECK: supple, thyroid normal size, non-tender, without nodularity LYMPH:  no palpable lymphadenopathy in the cervical, axillary or inguinal LUNGS: clear to auscultation and percussion with normal breathing effort HEART: regular rate & rhythm and no murmurs and no lower extremity edema ABDOMEN:abdomen soft, non-tender and normal bowel sounds MUSCULOSKELETAL:no cyanosis of digits and no clubbing  NEURO: alert & oriented x 3 with fluent speech, no focal motor/sensory deficits EXTREMITIES: No lower extremity edema  LABORATORY DATA:  I have reviewed the data as listed   Chemistry      Component Value Date/Time   NA 139 03/15/2016 0915   NA 146 (H) 01/20/2015 1457   NA 143 10/12/2013 1510   K 3.5 03/15/2016 0915   K 3.9 10/12/2013 1510   CL 101 03/15/2016 0915   CL 109 (H) 10/12/2013 1510   CO2 27 03/15/2016 0915   CO2 29 10/12/2013 1510   BUN 18 03/15/2016 0915   BUN 17 01/20/2015 1457   BUN 16 10/12/2013 1510   CREATININE 1.24 (H) 03/15/2016 0915   CREATININE 1.15 (H) 10/12/2015 1603      Component Value Date/Time   CALCIUM 9.8 03/15/2016 0915   CALCIUM 9.4 10/12/2013 1510   ALKPHOS 56 09/08/2015 0940   ALKPHOS 67 10/12/2013 1510   AST 14 09/08/2015 0940   AST 22 10/12/2013 1510   ALT 13 09/08/2015 0940   ALT 22 10/12/2013 1510   BILITOT 0.9 09/08/2015 0940   BILITOT 0.7 01/20/2015 1457   BILITOT 0.9 10/12/2013 1510       Lab Results  Component Value Date   WBC 4.8 03/15/2016   HGB 13.0 03/15/2016   HCT 39.8 03/15/2016   MCV 87.3 03/15/2016   PLT 255 03/15/2016   NEUTROABS 3.8 03/28/2015    ASSESSMENT & PLAN:  Malignant neoplasm of lower-outer quadrant of right breast  of female, estrogen receptor positive ([facility]) Right simple mastectomy: Grade 2 IDC with DCIS 1.5 cm, margins -0/3 lymph nodes, T1b N0 stage IA, ER 100%, PR 90%, Ki-67 10%, HER-2 negative ratio 1.28  Pathology counseling: I discussed the final pathology report of the patient provided  a copy of this report. I discussed the margins as well as lymph node surgeries. We also discussed the final staging along with previously performed ER/PR and HER-2/neu testing.  Recommendation: 1. Oncotype DX testing to determine if she would benefit from chemotherapy 2. followed by adjuvant antiestrogen therapy  Oncotype counseling: I discussed Oncotype DX test. I explained to the patient that this is a 21 gene panel to evaluate patient tumors DNA to calculate recurrence score. This would help determine whether patient has high risk or intermediate risk or low risk breast cancer. She understands that if her tumor was found to be high risk, she would benefit from systemic chemotherapy. If low risk, no need of chemotherapy. If she was  found to be intermediate risk, we would need to evaluate the score as well as other risk factors and determine if an abbreviated chemotherapy may be of benefit.  Return to clinic based upon Oncotype DX score result  I spent 25 minutes talking to the patient of which more than half was spent in counseling and coordination of care.  No orders of the defined types were placed in this encounter.  The patient has a good understanding of the overall plan. she agrees with it. she will call with any problems that may develop before the next visit here.   Rulon Eisenmenger, MD 03/25/16

## 2016-03-28 ENCOUNTER — Telehealth: Payer: Self-pay | Admitting: *Deleted

## 2016-03-28 NOTE — Telephone Encounter (Signed)
Ordered oncotype per Dr. Gudena.  Faxed requisition to pathology and confirmed receipt.  Faxed PA to [employer].  

## 2016-04-07 ENCOUNTER — Telehealth: Payer: Self-pay

## 2016-04-07 ENCOUNTER — Telehealth: Payer: Self-pay | Admitting: *Deleted

## 2016-04-07 NOTE — Telephone Encounter (Signed)
Pt calling back to return a call about pt test results of oncotype dx. Told pt that will forward her message to breast navigator nurses and will update her with more information tomorrow. Pt verbalized understanding and will wait for call tomorrow.

## 2016-04-07 NOTE — Telephone Encounter (Signed)
Received Oncotype Score of 11/7%. Physician team notified.

## 2016-04-08 ENCOUNTER — Telehealth: Payer: Self-pay | Admitting: *Deleted

## 2016-04-08 NOTE — Telephone Encounter (Signed)
Spoke with patient to inform her of the oncotype results.  She is so happy there is no need for chemo.  Informed her we would verify what anti- estrogen Dr. Lindi Adie wanted her to start on and would call into her pharmacy.  Patient verbalized understanding.

## 2016-04-11 ENCOUNTER — Other Ambulatory Visit: Payer: Self-pay | Admitting: *Deleted

## 2016-04-11 DIAGNOSIS — C50511 Malignant neoplasm of lower-outer quadrant of right female breast: Secondary | ICD-10-CM

## 2016-04-11 DIAGNOSIS — Z170 Estrogen receptor positive status [ER+]: Secondary | ICD-10-CM

## 2016-04-11 MED ORDER — LETROZOLE 2.5 MG PO TABS: 2.5000 mg | ORAL_TABLET | Freq: Every day | ORAL | 3 refills | Status: DC

## 2016-04-12 ENCOUNTER — Ambulatory Visit: Payer: BLUE CROSS/BLUE SHIELD | Admitting: Cardiovascular Disease

## 2016-04-12 ENCOUNTER — Telehealth: Payer: Self-pay | Admitting: Hematology and Oncology

## 2016-04-12 NOTE — Telephone Encounter (Signed)
sw pt to confirm [facility] appt in May per LOS

## 2016-04-18 ENCOUNTER — Encounter: Payer: Self-pay | Admitting: Primary Care

## 2016-04-18 ENCOUNTER — Encounter: Payer: Self-pay | Admitting: Internal Medicine

## 2016-04-18 ENCOUNTER — Ambulatory Visit (INDEPENDENT_AMBULATORY_CARE_PROVIDER_SITE_OTHER): Payer: BLUE CROSS/BLUE SHIELD | Admitting: Primary Care

## 2016-04-18 VITALS — BP 130/74 | HR 111 | Temp 99.6°F | Ht 65.5 in | Wt 215.8 lb

## 2016-04-18 DIAGNOSIS — J069 Acute upper respiratory infection, unspecified: Secondary | ICD-10-CM

## 2016-04-18 MED ORDER — BENZONATATE 200 MG PO CAPS: 200.0000 mg | ORAL_CAPSULE | Freq: Three times a day (TID) | ORAL | 0 refills | Status: DC | PRN

## 2016-04-18 NOTE — Patient Instructions (Signed)
Your symptoms are representative of a viral illness which will resolve on its own over time. Our goal is to treat your symptoms in order to aid your body in the healing process and to make you more comfortable.   You may take Benzonatate capsules for cough. Take 1 capsule by mouth three times daily as needed for cough.  Cough/Congestion: Try taking plain Mucinex. This will help loosen up the mucous in your chest. Ensure you take this medication with a full glass of water.  Continue Ibuprofen or tylenol for fevers, body aches.  Call your surgeon regarding the right breast tissue as discussed.  Please notify me if you develop persistent fevers of 101, start coughing up green mucous, notice increased fatigue or weakness, or feel worse after 1 week of onset of symptoms.   It was a pleasure meeting you!   Upper Respiratory Infection, Adult Most upper respiratory infections (URIs) are a viral infection of the air passages leading to the lungs. A URI affects the nose, throat, and upper air passages. The most common type of URI is nasopharyngitis and is typically referred to as "the common cold." URIs run their course and usually go away on their own. Most of the time, a URI does not require medical attention, but sometimes a bacterial infection in the upper airways can follow a viral infection. This is called a secondary infection. Sinus and middle ear infections are common types of secondary upper respiratory infections. Bacterial pneumonia can also complicate a URI. A URI can worsen asthma and chronic obstructive pulmonary disease (COPD). Sometimes, these complications can require emergency medical care and may be life threatening. What are the causes? Almost all URIs are caused by viruses. A virus is a type of germ and can spread from one person to another. What increases the risk? You may be at risk for a URI if:  You smoke.  You have chronic heart or lung disease.  You have a weakened  defense (immune) system.  You are very young or very old.  You have nasal allergies or asthma.  You work in crowded or poorly ventilated areas.  You work in health care facilities or schools. What are the signs or symptoms? Symptoms typically develop 2-3 days after you come in contact with a cold virus. Most viral URIs last 7-10 days. However, viral URIs from the influenza virus (flu virus) can last 14-18 days and are typically more severe. Symptoms may include:  Runny or stuffy (congested) nose.  Sneezing.  Cough.  Sore throat.  Headache.  Fatigue.  Fever.  Loss of appetite.  Pain in your forehead, behind your eyes, and over your cheekbones (sinus pain).  Muscle aches. How is this diagnosed? Your health care provider may diagnose a URI by:  Physical exam.  Tests to check that your symptoms are not due to another condition such as:  Strep throat.  Sinusitis.  Pneumonia.  Asthma. How is this treated? A URI goes away on its own with time. It cannot be cured with medicines, but medicines may be prescribed or recommended to relieve symptoms. Medicines may help:  Reduce your fever.  Reduce your cough.  Relieve nasal congestion. Follow these instructions at home:  Take medicines only as directed by your health care provider.  Gargle warm saltwater or take cough drops to comfort your throat as directed by your health care provider.  Use a warm mist humidifier or inhale steam from a shower to increase air moisture. This may make it  easier to breathe.  Drink enough fluid to keep your urine clear or pale yellow.  Eat soups and other clear broths and maintain good nutrition.  Rest as needed.  Return to work when your temperature has returned to normal or as your health care provider advises. You may need to stay home longer to avoid infecting others. You can also use a face mask and careful hand washing to prevent spread of the virus.  Increase the usage of  your inhaler if you have asthma.  Do not use any tobacco products, including cigarettes, chewing tobacco, or electronic cigarettes. If you need help quitting, ask your health care provider. How is this prevented? The best way to protect yourself from getting a cold is to practice good hygiene.  Avoid oral or hand contact with people with cold symptoms.  Wash your hands often if contact occurs. There is no clear evidence that vitamin C, vitamin E, echinacea, or exercise reduces the chance of developing a cold. However, it is always recommended to get plenty of rest, exercise, and practice good nutrition. Contact a health care provider if:  You are getting worse rather than better.  Your symptoms are not controlled by medicine.  You have chills.  You have worsening shortness of breath.  You have brown or red mucus.  You have yellow or brown nasal discharge.  You have pain in your face, especially when you bend forward.  You have a fever.  You have swollen neck glands.  You have pain while swallowing.  You have white areas in the back of your throat. Get help right away if:  You have severe or persistent:  Headache.  Ear pain.  Sinus pain.  Chest pain.  You have chronic lung disease and any of the following:  Wheezing.  Prolonged cough.  Coughing up blood.  A change in your usual mucus.  You have a stiff neck.  You have changes in your:  Vision.  Hearing.  Thinking.  Mood. This information is not intended to replace advice given to you by your health care provider. Make sure you discuss any questions you have with your health care provider. Document Released: 06/29/2000 Document Revised: 09/06/2015 Document Reviewed: 04/10/2013 Elsevier Interactive Patient Education  2017 [employer].

## 2016-04-18 NOTE — Progress Notes (Signed)
Subjective:    Patient ID: Mellanie Bejarano, female    DOB: October 04, 1955, 61 y.o.   MRN: [id number]  HPI  Ms. Trefz is a 61 year old female who presents today with a chief complaint of cough. She also reports low grade fevers, chills, fatigue, body aches. Her symptoms began three days ago. She's taken Ibuprofen for fevers with temporary improvement. Her cough is productive with whitish sputum. She underwent mastectomy in early March 2018. She has noticed increased redness to the mastectomy site since yesterday.   Review of Systems  Constitutional: Positive for chills, fatigue and fever.  HENT: Positive for congestion. Negative for ear pain, sinus pressure and sore throat.   Respiratory: Positive for cough. Negative for shortness of breath and wheezing.   Skin: Positive for color change.       Past Medical History:  Diagnosis Date  . Breast cancer ([place])   . Breast cancer, right breast ([place]) 12/2007   a. s/p chemo, radiation, and lumpectomy with negative sentinel lymph node biopsy   . Depression   . Diabetes mellitus without complication ([place])   . GERD (gastroesophageal reflux disease)   . Hyperlipidemia   . Hypertension    resistant, negative renal Dopplers and negative CT angiogram  . Migraines   . Obesity   . Seizures ([place])    last seizure  1 week ago: states she jumps for a second and it's gone     Social History   Social History  . Marital status: Married    Spouse name: [name]  . Number of children: N/A  . Years of education: 12+   Occupational History  . [employer]    Social History Main Topics  . Smoking status: Never Smoker  . Smokeless tobacco: Never Used  . Alcohol use No  . Drug use: No  . Sexual activity: Yes   Other Topics Concern  . Not on file   Social History Narrative   Lives at home with husband.   Caffeine use: 1-2 drinks per month         Epworth Sleepiness Scale = 15 (as of 01/01/15)    Past Surgical History:  Procedure Laterality Date  .  ABDOMINAL HYSTERECTOMY    . BREAST LUMPECTOMY    . CYST EXCISION    . FOOT SURGERY    . MASTECTOMY W/ SENTINEL NODE BIOPSY Right 03/18/2016   Procedure: RIGHT MASTECTOMY WITH RIGHT AXILLARY SENTINEL LYMPH NODE BIOPSY;  Surgeon: Alphonsa Overall, MD;  Location: [facility];  Service: General;  Laterality: Right;  . ovary tumor    . vocal cord nodules      Family History  Problem Relation Age of Onset  . Stroke Mother   . Heart disease Father   . Asthma Father   . Cancer Father     colon  . Cancer Sister     lung  . Cancer Brother     colon  . Breast cancer Other 48  . Diabetes Neg Hx     Allergies  Allergen Reactions  . No Known Allergies     Current Outpatient Prescriptions on File Prior to Visit  Medication Sig Dispense Refill  . Aspirin-Acetaminophen-Caffeine (GOODY HEADACHE PO) Take 1 Package by mouth 3 (three) times daily as needed (migraine).    . chlorthalidone (HYGROTON) 25 MG tablet TAKE 1 TABLET (25 MG TOTAL) BY MOUTH DAILY. 30 tablet 4  . citalopram (CELEXA) 20 MG tablet TAKE 1 TABLET (20 MG TOTAL) BY MOUTH DAILY. [place]  tablet 6  . hydrALAZINE (APRESOLINE) 100 MG tablet TAKE 1 TABLET (100 MG TOTAL) BY MOUTH 3 (THREE) TIMES DAILY. 270 tablet 0  . ibuprofen (ADVIL,MOTRIN) 600 MG tablet Take 1 tablet (600 mg total) by mouth every 6 (six) hours as needed for mild pain. 30 tablet 1  . irbesartan (AVAPRO) 300 MG tablet Take 300 mg by mouth daily.     Marland Kitchen letrozole (FEMARA) 2.5 MG tablet Take 1 tablet (2.5 mg total) by mouth daily. 90 tablet 3  . metFORMIN (GLUCOPHAGE) 1000 MG tablet TAKE 1 TABLET BY MOUTH TWICE A DAY WITH A MEAL 60 tablet 3  . pravastatin (PRAVACHOL) 40 MG tablet Take 1 tablet (40 mg total) by mouth daily. 30 tablet 5  . zonisamide (ZONEGRAN) 50 MG capsule Take 50 mg by mouth daily as needed (migraine).      No current facility-administered medications on file prior to visit.     BP 130/74   Pulse (!) 111   Temp 99.6 F (37.6 C) (Oral)   Ht 5' 5.5" (1.664 m)    Wt 215 lb 12.8 oz (97.9 kg)   SpO2 97%   BMI 35.36 kg/m    Objective:   Physical Exam  Constitutional: She appears well-nourished.  HENT:  Right Ear: Tympanic membrane and ear canal normal.  Left Ear: Tympanic membrane and ear canal normal.  Nose: Right sinus exhibits no maxillary sinus tenderness and no frontal sinus tenderness. Left sinus exhibits no maxillary sinus tenderness and no frontal sinus tenderness.  Mouth/Throat: Oropharynx is clear and moist.  Eyes: Conjunctivae are normal.  Neck: Neck supple.  Cardiovascular: Normal rate and regular rhythm.   Sinus tachycardia at 105  Pulmonary/Chest: Effort normal and breath sounds normal. She has no wheezes. She has no rales.  Dry cough noted 1-2 times during exam.  Lymphadenopathy:    She has no cervical adenopathy.  Skin: Skin is warm and dry.  Mild erythema to proximal surgical site. Mild inflammation that is baseline since procedure. No drainage. Non tender.          Assessment & Plan:  URI:  Cough, congestion, body aches, fevers, chills x 3 days. Temporary improvement with Ibuprofen. Exam today with clear lungs which is reassuring. Surgical site with minor erythema as noted above.  Suspect Viral URI but do worry about mastesctomy site. She will call her surgeon tomorrow. As of today does not appear infectious.  Will have her start Mucinex. Rx for Tessalon Pearls sent to pharmacy. Fluids, rest, return precautions provided.  Sheral Flow, NP

## 2016-04-18 NOTE — Progress Notes (Signed)
Pre visit review using our clinic review tool, if applicable. No additional management support is needed unless otherwise documented below in the visit note. 

## 2016-05-05 ENCOUNTER — Telehealth: Payer: Self-pay

## 2016-05-05 NOTE — Telephone Encounter (Signed)
noted 

## 2016-05-05 NOTE — Telephone Encounter (Signed)
Mickel Baas nurse case mgr [employer] left v/m;courtesy call if Avie Echevaria NP has any case mgt or utilization needs contact Mickel Baas. No cb needed unless can assist with this pt.

## 2016-05-17 ENCOUNTER — Encounter: Payer: Self-pay | Admitting: Internal Medicine

## 2016-05-18 ENCOUNTER — Ambulatory Visit (INDEPENDENT_AMBULATORY_CARE_PROVIDER_SITE_OTHER): Payer: BLUE CROSS/BLUE SHIELD | Admitting: Cardiovascular Disease

## 2016-05-18 ENCOUNTER — Encounter: Payer: Self-pay | Admitting: Cardiovascular Disease

## 2016-05-18 DIAGNOSIS — I10 Essential (primary) hypertension: Secondary | ICD-10-CM

## 2016-05-18 DIAGNOSIS — E7800 Pure hypercholesterolemia, unspecified: Secondary | ICD-10-CM | POA: Diagnosis not present

## 2016-05-18 NOTE — Addendum Note (Signed)
Addended by: Zebedee Iba on: 05/18/2016 09:44 AM   Modules accepted: Orders

## 2016-05-18 NOTE — Progress Notes (Signed)
05/18/2016 Carrie Singh   1955-02-10  [id number]  Primary Physician Webb Silversmith, NP Primary Cardiologist: Lorretta Harp MD Renae Gloss  HPI:  Carrie Singh is a 61 year old moderately overweight married African-American female no children is accompanied today by her husband [name]. Unless her in the office 05/06/15. She has a history of treated hypertension, diabetes and hyperlipidemia. She has never had a heart attack or stroke. She has occasional chest pain but has had a negative Myoview stress test in the last several months. She is has had difficult to control hypertension for the last 4-6 months and isn't on multiple antihypertensive medications. Renal Dopplers performed 11/13/14 were unrevealing as was a CT angiogram performed 11/28/14.over the last several months we have been titrating her blood pressure medicines she is seen [name] as an outpatient. Her blood pressure is much better controlled recently and she does feel clinically improved . She was diagnosed with breast cancer and had a mastectomy 03/18/16 and was doing well.  Current Outpatient Prescriptions  Medication Sig Dispense Refill  . Aspirin-Acetaminophen-Caffeine (GOODY HEADACHE PO) Take 1 Package by mouth 3 (three) times daily as needed (migraine).    . benzonatate (TESSALON) 200 MG capsule Take 1 capsule (200 mg total) by mouth 3 (three) times daily as needed for cough. 30 capsule 0  . chlorthalidone (HYGROTON) 25 MG tablet TAKE 1 TABLET (25 MG TOTAL) BY MOUTH DAILY. 30 tablet 4  . citalopram (CELEXA) 20 MG tablet TAKE 1 TABLET (20 MG TOTAL) BY MOUTH DAILY. 30 tablet 6  . hydrALAZINE (APRESOLINE) 100 MG tablet TAKE 1 TABLET (100 MG TOTAL) BY MOUTH 3 (THREE) TIMES DAILY. 270 tablet 0  . ibuprofen (ADVIL,MOTRIN) 600 MG tablet Take 1 tablet (600 mg total) by mouth every 6 (six) hours as needed for mild pain. 30 tablet 1  . irbesartan (AVAPRO) 300 MG tablet Take 300 mg by mouth daily.     Marland Kitchen letrozole (FEMARA) 2.5  MG tablet Take 1 tablet (2.5 mg total) by mouth daily. 90 tablet 3  . metFORMIN (GLUCOPHAGE) 1000 MG tablet TAKE 1 TABLET BY MOUTH TWICE A DAY WITH A MEAL 60 tablet 3  . pravastatin (PRAVACHOL) 40 MG tablet Take 1 tablet (40 mg total) by mouth daily. 30 tablet 5  . zonisamide (ZONEGRAN) 50 MG capsule Take 50 mg by mouth daily as needed (migraine).      No current facility-administered medications for this visit.     Allergies  Allergen Reactions  . No Known Allergies     Social History   Social History  . Marital status: Married    Spouse name: [name]  . Number of children: N/A  . Years of education: 12+   Occupational History  . [employer]    Social History Main Topics  . Smoking status: Never Smoker  . Smokeless tobacco: Never Used  . Alcohol use No  . Drug use: No  . Sexual activity: Yes   Other Topics Concern  . Not on file   Social History Narrative   Lives at home with husband.   Caffeine use: 1-2 drinks per month         Epworth Sleepiness Scale = 15 (as of 01/01/15)     Review of Systems: General: negative for chills, fever, night sweats or weight changes.  Cardiovascular: negative for chest pain, dyspnea on exertion, edema, orthopnea, palpitations, paroxysmal nocturnal dyspnea or shortness of breath Dermatological: negative for rash Respiratory: negative for cough or wheezing Urologic: negative  for hematuria Abdominal: negative for nausea, vomiting, diarrhea, bright red blood per rectum, melena, or hematemesis Neurologic: negative for visual changes, syncope, or dizziness All other systems reviewed and are otherwise negative except as noted above.    Blood pressure 116/80, pulse 100, height 5' 5.5" (1.664 m), weight 211 lb 12.8 oz (96.1 kg).  General appearance: alert and no distress Neck: no adenopathy, no carotid bruit, no JVD, supple, symmetrical, trachea midline and thyroid not enlarged, symmetric, no tenderness/mass/nodules Lungs: clear to auscultation  bilaterally Heart: regular rate and rhythm, S1, S2 normal, no murmur, click, rub or gallop Extremities: extremities normal, atraumatic, no cyanosis or edema  EKG sinus tachycardia 100 right bundle-branch block and left anterior fascicular block (bifascicular block) unchanged from prior EKGs. I personally reviewed this EKG.  ASSESSMENT AND PLAN:   HTN (hypertension) History of essential hypertension blood pressure measured 116/80. She is on chlorthalidone, hydralazine and Avapro. Continue current meds, doesn't  HLD (hyperlipidemia) History of hyperlipidemia on statin therapy followed by her PCP      Lorretta Harp MD [facility], [facility] [place] 05/18/2016 9:41 AM

## 2016-05-18 NOTE — Assessment & Plan Note (Signed)
History of essential hypertension blood pressure measured 116/80. She is on chlorthalidone, hydralazine and Avapro. Continue current meds, doesn't

## 2016-05-18 NOTE — Patient Instructions (Signed)
Your physician recommends that you schedule a follow-up appointment as needed with Dr. Berry   

## 2016-05-18 NOTE — Assessment & Plan Note (Signed)
History of hyperlipidemia on statin therapy followed by her PCP. 

## 2016-05-24 ENCOUNTER — Other Ambulatory Visit: Payer: Self-pay | Admitting: Internal Medicine

## 2016-05-25 NOTE — Progress Notes (Signed)
CLINIC:  Survivorship   REASON FOR VISIT:  Routine follow-up post-treatment for a recent history of breast cancer.  BRIEF ONCOLOGIC HISTORY:    Malignant neoplasm of lower-outer quadrant of right breast of female, estrogen receptor positive ([place])   12/18/2007 Initial Diagnosis    Right breast cancer treated with lumpectomy followed by radiation and 5 years of antiestrogen therapy with aromatase inhibitor      02/02/2016 Relapse/Recurrence    Right breast biopsy 6:00 position: IDC with DCIS grade 2, ER 100%, PR 90%, Ki-67 10%, HER-2 negative ratio 1.28, screening detected right breast lumps 0.5 cm and 0.3 cm, T1a N0 stage IA clinical stage      03/18/2016 Surgery    Right simple mastectomy: Grade 2 IDC with DCIS 1.5 cm, margins -0/3 lymph nodes, T1b N0 stage IA, ER 100%, PR 90%, Ki-67 10%, HER-2 negative ratio 1.28      03/18/2016 Oncotype testing    11/7%      04/2016 -  Anti-estrogen oral therapy    Letrozole 2.45m daily       INTERVAL HISTORY:  [facility] to review her survivorship care plan detailing her treatment course for breast cancer, as well as monitoring long-term side effects of that treatment, education regarding health maintenance, screening, and overall wellness and health promotion.     Overall, Carrie Singh Is doing well.  She started on Letrozole in April, 2018.  She does have some joint stiffness and is unsure if it is caused by the letrozole or not.  She says the pain is so significant she cannot get out of bed or walk sometimes. She denies vaginal dryness or hot flashes.     REVIEW OF SYSTEMS:  Review of Systems  Constitutional: Negative for appetite change, chills, diaphoresis, fatigue, fever and unexpected weight change.  Eyes: Negative for eye problems and icterus.  Respiratory: Negative for chest tightness, cough and shortness of breath.   Cardiovascular: Negative for chest pain, leg swelling  and palpitations.  Gastrointestinal: Negative for abdominal distention, abdominal pain, constipation and diarrhea.  Endocrine: Negative for hot flashes.  Musculoskeletal: Positive for arthralgias.  Neurological: Negative for dizziness and light-headedness.  Hematological: Negative for adenopathy. Does not bruise/bleed easily.  Psychiatric/Behavioral: Negative for depression. The patient is not nervous/anxious.   Breast: Denies any new nodularity, masses, tenderness, nipple changes, or nipple discharge.      ONCOLOGY TREATMENT TEAM:  1. Surgeon[facility]  [facility] 2. Medical Oncologist: Dr. GLindi Adie    PAST MEDICAL/SURGICAL HISTORY:  Past Medical History:  Diagnosis Date  . Breast cancer ([facility]   . Breast cancer, right breast ([facility] 12/2007   a. s/p chemo, radiation, and lumpectomy with negative sentinel lymph node biopsy   . Depression   . Diabetes mellitus without complication ([facility]   . GERD (gastroesophageal reflux disease)   . Hyperlipidemia   . Hypertension    resistant, negative renal Dopplers and negative CT angiogram  . Migraines   . Obesity   . Seizures ([facility]    last seizure  1 week ago: states she jumps for a second and it's gone   Past Surgical History:  Procedure Laterality Date  . ABDOMINAL HYSTERECTOMY    . BREAST LUMPECTOMY    . CYST EXCISION    . FOOT SURGERY    . MASTECTOMY W/ SENTINEL NODE BIOPSY Right 03/18/2016   Procedure: RIGHT MASTECTOMY WITH RIGHT AXILLARY SENTINEL LYMPH  NODE BIOPSY;  Surgeon: Alphonsa Overall, MD;  Location: [facility];  Service: General;  Laterality: Right;  . ovary tumor    . vocal cord nodules       ALLERGIES:  Allergies  Allergen Reactions  . No Known Allergies      CURRENT MEDICATIONS:  Outpatient Encounter Prescriptions as of 05/26/2016  Medication Sig  . Aspirin-Acetaminophen-Caffeine (GOODY HEADACHE PO) Take 1 Package by mouth 3 (three) times daily as needed (migraine).  . benzonatate (TESSALON) 200 MG  capsule Take 1 capsule (200 mg total) by mouth 3 (three) times daily as needed for cough.  . chlorthalidone (HYGROTON) 25 MG tablet TAKE 1 TABLET (25 MG TOTAL) BY MOUTH DAILY.  . citalopram (CELEXA) 20 MG tablet TAKE 1 TABLET (20 MG TOTAL) BY MOUTH DAILY.  . hydrALAZINE (APRESOLINE) 100 MG tablet TAKE 1 TABLET (100 MG TOTAL) BY MOUTH 3 (THREE) TIMES DAILY.  Marland Kitchen ibuprofen (ADVIL,MOTRIN) 600 MG tablet Take 1 tablet (600 mg total) by mouth every 6 (six) hours as needed for mild pain.  Marland Kitchen irbesartan (AVAPRO) 300 MG tablet Take 300 mg by mouth daily.   Marland Kitchen letrozole (FEMARA) 2.5 MG tablet Take 1 tablet (2.5 mg total) by mouth daily.  . metFORMIN (GLUCOPHAGE) 1000 MG tablet TAKE 1 TABLET BY MOUTH TWICE A DAY WITH A MEAL  . pravastatin (PRAVACHOL) 40 MG tablet Take 1 tablet (40 mg total) by mouth daily.  Marland Kitchen zonisamide (ZONEGRAN) 50 MG capsule Take 50 mg by mouth daily as needed (migraine).    No facility-administered encounter medications on file as of 05/26/2016.      ONCOLOGIC FAMILY HISTORY:  Family History  Problem Relation Age of Onset  . Stroke Mother   . Heart disease Father   . Asthma Father   . Cancer Father        colon  . Cancer Sister        lung  . Cancer Brother        colon  . Breast cancer Other 48  . Diabetes Neg Hx      GENETIC COUNSELING/TESTING: Not done  SOCIAL HISTORY:  Carrie Singh is married and lives in [place], [place].  She has 3 step children and they live in [place], [place], and [place].  Carrie Singh is currently on social security disability due to hypertension/migraines/memory loss.  She denies any current or history of tobacco, alcohol, or illicit drug use.     PHYSICAL EXAMINATION:  Vital Signs:   Vitals:   05/26/16 1354  BP: 122/70  Pulse: 90  Temp: 98.6 F (37 C)   Filed Weights   05/26/16 1354  Weight: 213 lb 6.4 oz (96.8 kg)   General: Well-nourished, well-appearing female in no acute distress.  She is accompanied in  clinic by her husband Carrie Singh today.   HEENT: Head is normocephalic.  Pupils equal and reactive to light. Conjunctivae clear without exudate.  Sclerae anicteric. Oral mucosa is pink, moist.  Oropharynx is pink without lesions or erythema.  Lymph: No cervical, supraclavicular, or infraclavicular lymphadenopathy noted on palpation.  Cardiovascular: Regular rate and rhythm.Marland Kitchen Respiratory: Clear to auscultation bilaterally. Chest expansion symmetric; breathing non-labored.  Breast: S/p right mastectomy, viewed wound, it is still very slightly open, but appears to be healing very well, it is dressed with a wet to dry dressing, with a very small amount of saline soaked gauze covered by an ABD pad.  The left bresat is without nodules, masses, skin or nipple changes.   GI: Abdomen  soft and round; non-tender, non-distended. Bowel sounds normoactive.  GU: Deferred.  Neuro: No focal deficits. Steady gait.  Psych: Mood and affect normal and appropriate for situation.  Extremities: No edema. Skin: Warm and dry.  LABORATORY DATA:  None for this visit.  DIAGNOSTIC IMAGING:  None for this visit.      ASSESSMENT AND PLAN:  Ms.. Singh is a pleasant 61 y.o. female with Stage IA right breast invasive ductal carcinoma, ER+/PR+/HER2-, diagnosed in 2009 then again in 2018, most recently treated with mastectomy, and anti-estrogen therapy with Letrozole beginning in 04/2016.  She presents to the Survivorship Clinic for our initial meeting and routine follow-up post-completion of treatment for breast cancer.    1. Stage IA right breast cancer:  Carrie Singh is continuing to recover from definitive treatment for breast cancer.  She is having a difficult time with arthralgias that worsened once starting Letrozole.  She and I reviewed that it could certainly be the letrozole making this worse and that we should give her a two week holiday and then switch her to Exemestane.  I reviewed this with her in detail.  I reviewed it  with Dr. Lindi Adie.  She will follow-up with her medical oncologist, Dr. Lindi Adie in 06/2016 to see how she is tolerating Exemestane.  She knows that if her pain has resolved, that she can cancel the appointment and follow up with him in 6 months.  If not, she will keep the appointment and may need to switch to Tamoxifen.  Today, a comprehensive survivorship care plan and treatment summary was reviewed with the patient today detailing her breast cancer diagnosis, treatment course, potential late/long-term effects of treatment, appropriate follow-up care with recommendations for the future, and patient education resources.  A copy of this summary, along with a letter will be sent to the patient's primary care provider via mail/fax/In Basket message after today's visit.    2. Bone health:  Given Carrie Singh's age/history of breast cancer and her current treatment regimen including anti-estrogen therapy with Letrozole, she is at risk for bone demineralization.  Her last DEXA scan was in 01/30/2008, which was normal.  For convenience, we will repeat this in December, 2018 when her mammogram is due.  In the meantime, she was encouraged to increase her consumption of foods rich in calcium, as well as increase her weight-bearing activities.  She was given education on specific activities to promote bone health.  3. Cancer screening:  Due to Carrie Singh's history and her age, she should receive screening for skin cancers, colon cancer, and gynecologic cancers.  The information and recommendations are listed on the patient's comprehensive care plan/treatment summary and were reviewed in detail with the patient.    4. Health maintenance and wellness promotion: Carrie Singh was encouraged to consume 5-7 servings of fruits and vegetables per day. We reviewed the "Nutrition Rainbow" handout, as well as the handout "Take Control of Your Health and Reduce Your Cancer Risk" from the [facility].  She was also encouraged to  engage in moderate to vigorous exercise for 30 minutes per day most days of the week. We discussed the [employer] fitness program, which is designed for cancer survivors to help them become more physically fit after cancer treatments.  She was instructed to limit her alcohol consumption and continue to abstain from tobacco use.     5. Support services/counseling: It is not uncommon for this period of the patient's cancer care trajectory to be one of many emotions  and stressors.  We discussed an opportunity for her to participate in the next session of [facility] ("Finding Your New Normal") support group series designed for patients after they have completed treatment.   Carrie Singh was encouraged to take advantage of our many other support services programs, support groups, and/or counseling in coping with her new life as a cancer survivor after completing anti-cancer treatment.  She was offered support today through active listening and expressive supportive counseling.  She was also scheduled to see our financial counselors today at the end of the appointment.  She was given information regarding our available services and encouraged to contact me with any questions or for help enrolling in any of our support group/programs.    Dispo:   -Return to cancer center tentatively in June for follow up with Dr. Lindi Adie -Follow up with Dr. Lucia Gaskins in one month as scheduled -She is welcome to return back to the Survivorship Clinic at any time; no additional follow-up needed at this time.  -Consider referral back to survivorship as a long-term survivor for continued surveillance  A total of (30) minutes of face-to-face time was spent with this patient with greater than 50% of that time in counseling and care-coordination.   Gardenia Phlegm, NP Survivorship Program [facility] [phone]   Note: PRIMARY CARE PROVIDER Jearld Fenton, [place] [phone]

## 2016-05-26 ENCOUNTER — Ambulatory Visit (HOSPITAL_BASED_OUTPATIENT_CLINIC_OR_DEPARTMENT_OTHER): Payer: BLUE CROSS/BLUE SHIELD | Admitting: Adult Health

## 2016-05-26 ENCOUNTER — Encounter: Payer: Self-pay | Admitting: Hematology and Oncology

## 2016-05-26 VITALS — BP 122/70 | HR 90 | Temp 98.6°F | Ht 65.5 in | Wt 213.4 lb

## 2016-05-26 DIAGNOSIS — Z170 Estrogen receptor positive status [ER+]: Secondary | ICD-10-CM | POA: Diagnosis not present

## 2016-05-26 DIAGNOSIS — C50511 Malignant neoplasm of lower-outer quadrant of right female breast: Secondary | ICD-10-CM

## 2016-05-26 DIAGNOSIS — Z79811 Long term (current) use of aromatase inhibitors: Secondary | ICD-10-CM | POA: Diagnosis not present

## 2016-05-26 DIAGNOSIS — E2839 Other primary ovarian failure: Secondary | ICD-10-CM

## 2016-05-26 DIAGNOSIS — Z1239 Encounter for other screening for malignant neoplasm of breast: Secondary | ICD-10-CM

## 2016-05-26 MED ORDER — EXEMESTANE 25 MG PO TABS: 25.0000 mg | ORAL_TABLET | Freq: Every day | ORAL | 5 refills | Status: DC

## 2016-05-26 NOTE — Progress Notes (Signed)
Rcvd call from [place] that pt wanted to talk w/ me about financial concerns.  I called pt and left a message to find out what her concerns are and to schedule an appt w/ me if needed.  Requested she return my call at her earliest convenience.

## 2016-05-27 ENCOUNTER — Encounter: Payer: Self-pay | Admitting: Adult Health

## 2016-05-27 ENCOUNTER — Encounter: Payer: Self-pay | Admitting: Internal Medicine

## 2016-05-27 ENCOUNTER — Encounter: Payer: Self-pay | Admitting: Hematology and Oncology

## 2016-05-27 NOTE — Progress Notes (Signed)
Spoke w/ pt, she was inquiring about the [employer].  I informed her that the grant is available for pt's in active treatment so she wouldn't qualify at this time because she's not in active treatment.  She verbalized understanding.  She was also asking for assistance for her hospital bills.  I advised that she call customer service on the hospital side so that they can give her options that's available for assistance and gave her the number.  She was very Patent attorney.

## 2016-05-31 ENCOUNTER — Ambulatory Visit: Payer: BLUE CROSS/BLUE SHIELD

## 2016-06-17 NOTE — Addendum Note (Signed)
Addendum  created 06/17/16 1056 by Effie Berkshire, MD   Sign clinical note

## 2016-06-24 ENCOUNTER — Encounter: Payer: Self-pay | Admitting: Hematology and Oncology

## 2016-06-24 ENCOUNTER — Ambulatory Visit (HOSPITAL_BASED_OUTPATIENT_CLINIC_OR_DEPARTMENT_OTHER): Payer: BLUE CROSS/BLUE SHIELD | Admitting: Hematology and Oncology

## 2016-06-24 DIAGNOSIS — M2550 Pain in unspecified joint: Secondary | ICD-10-CM | POA: Diagnosis not present

## 2016-06-24 DIAGNOSIS — R530 Neoplastic (malignant) related fatigue: Secondary | ICD-10-CM | POA: Diagnosis not present

## 2016-06-24 DIAGNOSIS — Z170 Estrogen receptor positive status [ER+]: Secondary | ICD-10-CM | POA: Diagnosis not present

## 2016-06-24 DIAGNOSIS — C50511 Malignant neoplasm of lower-outer quadrant of right female breast: Secondary | ICD-10-CM

## 2016-06-24 NOTE — Progress Notes (Signed)
Patient Care Team: Jearld Fenton, NP as PCP - General (Internal Medicine) Minna Merritts, MD as Consulting Physician (Cardiology) Nicholas Lose, MD as Consulting Physician (Hematology and Oncology) Alphonsa Overall, MD as Consulting Physician (General Surgery) Delice Bison Charlestine Massed, NP as Nurse Practitioner (Hematology and Oncology)  DIAGNOSIS:  Encounter Diagnosis  Name Primary?  . Malignant neoplasm of lower-outer quadrant of right breast of female, estrogen receptor positive ([place])     SUMMARY OF ONCOLOGIC HISTORY:   Malignant neoplasm of lower-outer quadrant of right breast of female, estrogen receptor positive ([place])   12/18/2007 Initial Diagnosis    Right breast cancer treated with lumpectomy followed by radiation and 5 years of antiestrogen therapy with aromatase inhibitor      02/02/2016 Relapse/Recurrence    Right breast biopsy 6:00 position: IDC with DCIS grade 2, ER 100%, PR 90%, Ki-67 10%, HER-2 negative ratio 1.28, screening detected right breast lumps 0.5 cm and 0.3 cm, T1a N0 stage IA clinical stage      03/18/2016 Surgery    Right simple mastectomy: Grade 2 IDC with DCIS 1.5 cm, margins -0/3 lymph nodes, T1b N0 stage IA, ER 100%, PR 90%, Ki-67 10%, HER-2 negative ratio 1.28      03/18/2016 Oncotype testing    11/7%      04/2016 -  Anti-estrogen oral therapy    Letrozole 2.65m daily switch to exemestane 25 mg daily May 2018       CHIEF COMPLIANT: Follow-up on exemestane  INTERVAL HISTORY: Carrie Kolinskiis a 61year old with above-mentioned history of recurrent breast cancer who was treated with mastectomy and is currently on antiestrogen therapy. She was on letrozole but had profound myalgias and arthralgias and she will switch to exemestane. She appears to be tolerating it much better than letrozole however she still has muscle aches and pains as well as fatigue issues. Because of this she has not been very active. The surgical scars have healed very  well.  REVIEW OF SYSTEMS:   Constitutional: Denies fevers, chills or abnormal weight loss, complains of severe fatigue and muscle aches Eyes: Denies blurriness of vision Ears, nose, mouth, throat, and face: Denies mucositis or sore throat Respiratory: Denies cough, dyspnea or wheezes Cardiovascular: Denies palpitation, chest discomfort Gastrointestinal:  Denies nausea, heartburn or change in bowel habits Skin: Denies abnormal skin rashes Lymphatics: Denies new lymphadenopathy or easy bruising Neurological:Denies numbness, tingling or new weaknesses Behavioral/Psych: Mood is stable, no new changes  Extremities: No lower extremity edema Breast: Right mastectomy All other systems were reviewed with the patient and are negative.  I have reviewed the past medical history, past surgical history, social history and family history with the patient and they are unchanged from previous note.  ALLERGIES:  is allergic to no known allergies.  MEDICATIONS:  Current Outpatient Prescriptions  Medication Sig Dispense Refill  . Aspirin-Acetaminophen-Caffeine (GOODY HEADACHE PO) Take 1 Package by mouth 3 (three) times daily as needed (migraine).    . chlorthalidone (HYGROTON) 25 MG tablet TAKE 1 TABLET (25 MG TOTAL) BY MOUTH DAILY. 30 tablet 4  . citalopram (CELEXA) 20 MG tablet TAKE 1 TABLET (20 MG TOTAL) BY MOUTH DAILY. 30 tablet 6  . exemestane (AROMASIN) 25 MG tablet Take 1 tablet (25 mg total) by mouth daily after breakfast. 30 tablet 5  . hydrALAZINE (APRESOLINE) 100 MG tablet TAKE 1 TABLET (100 MG TOTAL) BY MOUTH 3 (THREE) TIMES DAILY. 270 tablet 0  . ibuprofen (ADVIL,MOTRIN) 600 MG tablet Take 1 tablet (600 mg total)  by mouth every 6 (six) hours as needed for mild pain. 30 tablet 1  . irbesartan (AVAPRO) 300 MG tablet Take 300 mg by mouth daily.     . metFORMIN (GLUCOPHAGE) 1000 MG tablet TAKE 1 TABLET BY MOUTH TWICE A DAY WITH A MEAL 60 tablet 3  . pravastatin (PRAVACHOL) 40 MG tablet Take 1  tablet (40 mg total) by mouth daily. 30 tablet 5  . zonisamide (ZONEGRAN) 50 MG capsule Take 50 mg by mouth daily as needed (migraine).      No current facility-administered medications for this visit.     PHYSICAL EXAMINATION: ECOG PERFORMANCE STATUS: 1 - Symptomatic but completely ambulatory  Vitals:   06/24/16 0840  BP: (!) 146/79  Pulse: 87  Resp: 18  Temp: 98.2 F (36.8 C)   Filed Weights   06/24/16 0840  Weight: 213 lb 14.4 oz (97 kg)    GENERAL:alert, no distress and comfortable SKIN: skin color, texture, turgor are normal, no rashes or significant lesions EYES: normal, Conjunctiva are pink and non-injected, sclera clear OROPHARYNX:no exudate, no erythema and lips, buccal mucosa, and tongue normal  NECK: supple, thyroid normal size, non-tender, without nodularity LYMPH:  no palpable lymphadenopathy in the cervical, axillary or inguinal LUNGS: clear to auscultation and percussion with normal breathing effort HEART: regular rate & rhythm and no murmurs and no lower extremity edema ABDOMEN:abdomen soft, non-tender and normal bowel sounds MUSCULOSKELETAL:no cyanosis of digits and no clubbing  NEURO: alert & oriented x 3 with fluent speech, no focal motor/sensory deficits EXTREMITIES: No lower extremity edema  LABORATORY DATA:  I have reviewed the data as listed   Chemistry      Component Value Date/Time   NA 139 03/15/2016 0915   NA 146 (H) 01/20/2015 1457   NA 143 10/12/2013 1510   K 3.5 03/15/2016 0915   K 3.9 10/12/2013 1510   CL 101 03/15/2016 0915   CL 109 (H) 10/12/2013 1510   CO2 27 03/15/2016 0915   CO2 29 10/12/2013 1510   BUN 18 03/15/2016 0915   BUN 17 01/20/2015 1457   BUN 16 10/12/2013 1510   CREATININE 1.24 (H) 03/15/2016 0915   CREATININE 1.15 (H) 10/12/2015 1603      Component Value Date/Time   CALCIUM 9.8 03/15/2016 0915   CALCIUM 9.4 10/12/2013 1510   ALKPHOS 56 09/08/2015 0940   ALKPHOS 67 10/12/2013 1510   AST 14 09/08/2015 0940    AST 22 10/12/2013 1510   ALT 13 09/08/2015 0940   ALT 22 10/12/2013 1510   BILITOT 0.9 09/08/2015 0940   BILITOT 0.7 01/20/2015 1457   BILITOT 0.9 10/12/2013 1510       Lab Results  Component Value Date   WBC 4.8 03/15/2016   HGB 13.0 03/15/2016   HCT 39.8 03/15/2016   MCV 87.3 03/15/2016   PLT 255 03/15/2016   NEUTROABS 3.8 03/28/2015    ASSESSMENT & PLAN:  Malignant neoplasm of lower-outer quadrant of right breast of female, estrogen receptor positive ([facility]) Right simple mastectomy: Grade 2 IDC with DCIS 1.5 cm, margins -0/3 lymph nodes, T1b N0 stage IA, ER 100%, PR 90%, Ki-67 10%, HER-2 negative ratio 1.28 Oncotype DX score 11:7% risk of recurrence  Current treatment: Letrozole 2.5 mg daily started 04/2016 switched to exemestane 05/26/2016  Exemestane toxicities: Muscle and joint pains I instructed her to take turmeric supplement to decrease the myalgias. Fatigue: I gave her instructions to join the [employer] live strong program. I encouraged her to see her  primary care physician  Return to clinic in 6 months for follow-up  I spent 25 minutes talking to the patient of which more than half was spent in counseling and coordination of care.  No orders of the defined types were placed in this encounter.  The patient has a good understanding of the overall plan. she agrees with it. she will call with any problems that may develop before the next visit here.   Rulon Eisenmenger, MD 06/24/16

## 2016-06-24 NOTE — Assessment & Plan Note (Signed)
Right simple mastectomy: Grade 2 IDC with DCIS 1.5 cm, margins -0/3 lymph nodes, T1b N0 stage IA, ER 100%, PR 90%, Ki-67 10%, HER-2 negative ratio 1.28 Oncotype DX score 11:7% risk of recurrence  Current treatment: Letrozole 2.5 mg daily started 04/2016 switched to exemestane 05/26/2016  Exemestane toxicities: Muscle and joint pains  Return to clinic in 6 months for follow-up

## 2016-08-10 ENCOUNTER — Telehealth: Payer: Self-pay | Admitting: Internal Medicine

## 2016-08-10 NOTE — Telephone Encounter (Signed)
[employer] faxed paperwork In regina's in box To review and sign

## 2016-08-11 NOTE — Telephone Encounter (Signed)
Signed, given back to [name] 

## 2016-08-12 NOTE — Telephone Encounter (Signed)
Paperwork faxed °

## 2016-08-12 NOTE — Telephone Encounter (Signed)
Spouse aware paperwork has been faxed in Copy for file Copy for scan Copy for pt

## 2016-08-23 ENCOUNTER — Other Ambulatory Visit: Payer: Self-pay | Admitting: Internal Medicine

## 2016-09-22 ENCOUNTER — Telehealth: Payer: Self-pay

## 2016-09-22 NOTE — Telephone Encounter (Signed)
Rn call [employer] at [phone]. Pt was last seen 07/27/2015 by Dr.Ahern. Dr Jaynee Eagles stated below pt is not disable by her headaches. Form was sent by her disability company. PEr epic pts PCP is filing out pts disability forms. Pt has no more follow up appts at our office.     Melvenia Beam, MD      12/30/14 6:40 PM  Note    Thanks, I do not feel she is disabled by her headaches. Thanks

## 2016-09-22 NOTE — Telephone Encounter (Signed)
Rn tried to call [employer] but was on hold for 8 min. Rn had to hang up did not speak with anybody. Please give message if [employer] calls back.

## 2016-09-29 ENCOUNTER — Encounter: Payer: Self-pay | Admitting: Internal Medicine

## 2016-09-29 ENCOUNTER — Ambulatory Visit (INDEPENDENT_AMBULATORY_CARE_PROVIDER_SITE_OTHER): Payer: BLUE CROSS/BLUE SHIELD | Admitting: Internal Medicine

## 2016-09-29 VITALS — BP 136/84 | HR 75 | Temp 98.3°F | Ht 65.25 in | Wt 213.5 lb

## 2016-09-29 DIAGNOSIS — E119 Type 2 diabetes mellitus without complications: Secondary | ICD-10-CM | POA: Diagnosis not present

## 2016-09-29 DIAGNOSIS — F329 Major depressive disorder, single episode, unspecified: Secondary | ICD-10-CM

## 2016-09-29 DIAGNOSIS — E7800 Pure hypercholesterolemia, unspecified: Secondary | ICD-10-CM

## 2016-09-29 DIAGNOSIS — G44011 Episodic cluster headache, intractable: Secondary | ICD-10-CM

## 2016-09-29 DIAGNOSIS — G4710 Hypersomnia, unspecified: Secondary | ICD-10-CM

## 2016-09-29 DIAGNOSIS — F450 Somatization disorder: Secondary | ICD-10-CM

## 2016-09-29 DIAGNOSIS — G4730 Sleep apnea, unspecified: Secondary | ICD-10-CM | POA: Diagnosis not present

## 2016-09-29 DIAGNOSIS — Z170 Estrogen receptor positive status [ER+]: Secondary | ICD-10-CM | POA: Diagnosis not present

## 2016-09-29 DIAGNOSIS — C50511 Malignant neoplasm of lower-outer quadrant of right female breast: Secondary | ICD-10-CM

## 2016-09-29 DIAGNOSIS — Z0001 Encounter for general adult medical examination with abnormal findings: Secondary | ICD-10-CM | POA: Diagnosis not present

## 2016-09-29 DIAGNOSIS — I158 Other secondary hypertension: Secondary | ICD-10-CM | POA: Diagnosis not present

## 2016-09-29 LAB — COMPREHENSIVE METABOLIC PANEL
ALT: 22 U/L (ref 0–35)
AST: 28 U/L (ref 0–37)
Albumin: 4.2 g/dL (ref 3.5–5.2)
Alkaline Phosphatase: 57 U/L (ref 39–117)
BUN: 20 mg/dL (ref 6–23)
CO2: 29 mEq/L (ref 19–32)
Calcium: 9.7 mg/dL (ref 8.4–10.5)
Chloride: 102 mEq/L (ref 96–112)
Creatinine, Ser: 1.12 mg/dL (ref 0.40–1.20)
GFR: 63.11 mL/min (ref >60.00)
Glucose, Bld: 109 mg/dL — ABNORMAL HIGH (ref 70–99)
Potassium: 4 mEq/L (ref 3.5–5.1)
Sodium: 138 mEq/L (ref 135–145)
Total Bilirubin: 1 mg/dL (ref 0.2–1.2)
Total Protein: 7.1 g/dL (ref 6.0–8.3)

## 2016-09-29 LAB — CBC
HCT: 36.5 % (ref 36.0–46.0)
Hemoglobin: 12.1 g/dL (ref 12.0–15.0)
MCHC: 33.1 g/dL (ref 30.0–36.0)
MCV: 86.9 fl (ref 78.0–100.0)
Platelets: 293 10*3/uL (ref 150.0–400.0)
RBC: 4.2 Mil/uL (ref 3.87–5.11)
RDW: 14.3 % (ref 11.5–15.5)
WBC: 5.3 10*3/uL (ref 4.0–10.5)

## 2016-09-29 LAB — HEMOGLOBIN A1C: Hgb A1c MFr Bld: 6.8 % — ABNORMAL HIGH (ref 4.6–6.5)

## 2016-09-29 LAB — LIPID PANEL
Cholesterol: 150 mg/dL (ref 0–200)
HDL: 63.6 mg/dL (ref >39.00)
LDL Cholesterol: 65 mg/dL (ref 0–99)
NonHDL: 86.14
Total CHOL/HDL Ratio: 2
Triglycerides: 105 mg/dL (ref 0.0–149.0)
VLDL: 21 mg/dL (ref 0.0–40.0)

## 2016-09-29 NOTE — Assessment & Plan Note (Addendum)
Stable off Celexa, she does not want to restart Will monitor

## 2016-09-29 NOTE — Assessment & Plan Note (Signed)
Controlled on Chlortahlidone, Hydralazine and Irbesartan CMET today Will monitor

## 2016-09-29 NOTE — Progress Notes (Signed)
Subjective:    Patient ID: Carrie Singh, female    DOB: 1956/01/07, 61 y.o.   MRN: [id number]  HPI  Pt presents to the clinic today for her annual exam. She is also due for follow up of chronic conditions.  Breast Cancer: s/p right mastectomy. She is taking Exemestane as prescribed. She follows with Dr. Lindi Adie, note from 06/2016 reviewed.  HTN: Her BP today is 136/84. She is taking Chlorthalidone, Hydralazine and Irbesartan as prescribed. ECG from 05/2016 reviewed. She follows with Dr. Gwenlyn Found.  HLD: Her last LDL was 85, 08/2015. She is taking Pravastatin as prescribed. She denies myalgias. She tries to consume a low fat diet.  Depression: Triggered by her deteriorating health and financial strain. She is not taking Celexa with reasonable control. She denies SI/HI.  OSA: She does not wear a CPAP machine at night. She reports she sleeps pretty well.  Frequent Headaches: She is not sure what her triggers are. She has been seen by neurology in the past for the same. She takes Zonisamide and Ibuprofen as needed with good relief.  DM 2: Her last A1C was 6.8%, 02/2016. She is taking Metformin as prescribed. Her sugars range 125-167. She does not check her feet. Her last eye exam was.  Flu: never Tetanus: 12/2008 Pneomovax: never Zostovax: never Shingrix: never Mammogram: 01/2016 Pap Smear: 2010, hysterectomy Bone Density: 01/2008 Colon Screening: 01/2011 Vision Screening: annually at [facility] Dentist: as needed  Diet: She does eat meat. She consumes fruits and veggies daily. She tries to avoid fried foods. She drinks mostly water. Exercise: Stretches and walking.   Review of Systems      Past Medical History:  Diagnosis Date  . Breast cancer ([place])   . Breast cancer, right breast ([place]) 12/2007   a. s/p chemo, radiation, and lumpectomy with negative sentinel lymph node biopsy   . Depression   . Diabetes mellitus without complication ([place])   . GERD (gastroesophageal reflux  disease)   . Hyperlipidemia   . Hypertension    resistant, negative renal Dopplers and negative CT angiogram  . Migraines   . Obesity   . Seizures ([place])    last seizure  1 week ago: states she jumps for a second and it's gone    Current Outpatient Prescriptions  Medication Sig Dispense Refill  . Aspirin-Acetaminophen-Caffeine (GOODY HEADACHE PO) Take 1 Package by mouth 3 (three) times daily as needed (migraine).    . chlorthalidone (HYGROTON) 25 MG tablet TAKE 1 TABLET (25 MG TOTAL) BY MOUTH DAILY. 30 tablet 4  . citalopram (CELEXA) 20 MG tablet TAKE 1 TABLET (20 MG TOTAL) BY MOUTH DAILY. 30 tablet 6  . exemestane (AROMASIN) 25 MG tablet Take 1 tablet (25 mg total) by mouth daily after breakfast. 30 tablet 5  . hydrALAZINE (APRESOLINE) 100 MG tablet TAKE 1 TABLET (100 MG TOTAL) BY MOUTH 3 (THREE) TIMES DAILY. 270 tablet 0  . ibuprofen (ADVIL,MOTRIN) 600 MG tablet Take 1 tablet (600 mg total) by mouth every 6 (six) hours as needed for mild pain. 30 tablet 1  . irbesartan (AVAPRO) 300 MG tablet Take 300 mg by mouth daily.     . metFORMIN (GLUCOPHAGE) 1000 MG tablet TAKE 1 TABLET BY MOUTH TWICE A DAY WITH A MEAL 60 tablet 3  . pravastatin (PRAVACHOL) 40 MG tablet Take 1 tablet (40 mg total) by mouth daily. MUST SCHEDULE ANNUAL PHYSICAL 30 tablet 1  . zonisamide (ZONEGRAN) 50 MG capsule Take 50 mg by mouth daily as  needed (migraine).      No current facility-administered medications for this visit.     Allergies  Allergen Reactions  . No Known Allergies     Family History  Problem Relation Age of Onset  . Stroke Mother   . Heart disease Father   . Asthma Father   . Cancer Father        colon  . Cancer Sister        lung  . Cancer Brother        colon  . Breast cancer Other 48  . Diabetes Neg Hx     Social History   Social History  . Marital status: Married    Spouse name: [name]  . Number of children: N/A  . Years of education: 12+   Occupational History  . [employer]     Social History Main Topics  . Smoking status: Never Smoker  . Smokeless tobacco: Never Used  . Alcohol use No  . Drug use: No  . Sexual activity: Yes   Other Topics Concern  . Not on file   Social History Narrative   Lives at home with husband.   Caffeine use: 1-2 drinks per month         Epworth Sleepiness Scale = 15 (as of 01/01/15)     Constitutional: Pt reports intermittent headaches. Denies fever, malaise, fatigue, or abrupt weight changes.  HEENT: Denies eye pain, eye redness, ear pain, ringing in the ears, wax buildup, runny nose, nasal congestion, bloody nose, or sore throat. Respiratory: Denies difficulty breathing, shortness of breath, cough or sputum production.   Cardiovascular: Denies chest pain, chest tightness, palpitations or swelling in the hands or feet.  Gastrointestinal: Pt reports intermittent reflux. Denies abdominal pain, bloating, constipation, diarrhea or blood in the stool.  GU: Denies urgency, frequency, pain with urination, burning sensation, blood in urine, odor or discharge. Musculoskeletal: Denies decrease in range of motion, difficulty with gait, muscle pain or joint pain and swelling.  Skin: Denies redness, rashes, lesions or ulcercations.  Neurological: Denies dizziness, difficulty with memory, difficulty with speech or problems with balance and coordination.  Psych: Pt has history of depression. Denies anxiety, SI/HI.  No other specific complaints in a complete review of systems (except as listed in HPI above).  Objective:   Physical Exam  BP 136/84   Pulse 75   Temp 98.3 F (36.8 C) (Oral)   Ht 5' 5.25" (1.657 m)   Wt 213 lb 8 oz (96.8 kg)   SpO2 98%   BMI 35.26 kg/m  Wt Readings from Last 3 Encounters:  09/29/16 213 lb 8 oz (96.8 kg)  06/24/16 213 lb 14.4 oz (97 kg)  05/26/16 213 lb 6.4 oz (96.8 kg)    General: Appears her stated age, obese in NAD. Skin: Warm, dry and intact.  HEENT: Head: normal shape and size; Eyes: sclera  white, no icterus, conjunctiva pink, PERRLA and EOMs intact; Ears: Tm's gray and intact, normal light reflex; Throat/Mouth: Teeth present, mucosa pink and moist, no exudate, lesions or ulcerations noted.  Neck:  Neck supple, trachea midline. No masses, lumps or thyromegaly present.  Cardiovascular: Normal rate and rhythm. S1,S2 noted.  No murmur, rubs or gallops noted. No JVD or BLE edema. No carotid bruits noted. Pulmonary/Chest: Normal effort and positive vesicular breath sounds. No respiratory distress. No wheezes, rales or ronchi noted.  Abdomen: Soft and nontender. Normal bowel sounds. No distention or masses noted. Liver, spleen and kidneys non palpable. Musculoskeletal: Strength  5/5 BUE/BLE. No difficulty with gait.  Neurological: Alert and oriented. Cranial nerves II-XII grossly intact. Coordination normal.  Psychiatric: Mood and affect normal. Behavior is normal. Judgment and thought content normal.     BMET    Component Value Date/Time   NA 139 03/15/2016 0915   NA 146 (H) 01/20/2015 1457   NA 143 10/12/2013 1510   K 3.5 03/15/2016 0915   K 3.9 10/12/2013 1510   CL 101 03/15/2016 0915   CL 109 (H) 10/12/2013 1510   CO2 27 03/15/2016 0915   CO2 29 10/12/2013 1510   GLUCOSE 176 (H) 03/15/2016 0915   GLUCOSE 92 10/12/2013 1510   BUN 18 03/15/2016 0915   BUN 17 01/20/2015 1457   BUN 16 10/12/2013 1510   CREATININE 1.24 (H) 03/15/2016 0915   CREATININE 1.15 (H) 10/12/2015 1603   CALCIUM 9.8 03/15/2016 0915   CALCIUM 9.4 10/12/2013 1510   GFRNONAA 46 (L) 03/15/2016 0915   GFRNONAA 56 (L) 10/12/2013 1510   GFRAA 53 (L) 03/15/2016 0915   GFRAA >60 10/12/2013 1510    Lipid Panel     Component Value Date/Time   CHOL 170 09/08/2015 0940   CHOL 179 10/17/2014 1527   TRIG 111.0 09/08/2015 0940   HDL 62.40 09/08/2015 0940   HDL 59 10/17/2014 1527   CHOLHDL 3 09/08/2015 0940   VLDL 22.2 09/08/2015 0940   LDLCALC 85 09/08/2015 0940   LDLCALC 91 10/17/2014 1527    CBC      Component Value Date/Time   WBC 4.8 03/15/2016 0915   RBC 4.56 03/15/2016 0915   HGB 13.0 03/15/2016 0915   HGB 13.5 10/12/2013 1510   HGB 12.8 08/13/2009 0946   HCT 39.8 03/15/2016 0915   HCT 40.5 10/12/2013 1510   HCT 36.8 08/13/2009 0946   PLT 255 03/15/2016 0915   PLT 278 10/12/2013 1510   PLT 271 08/13/2009 0946   MCV 87.3 03/15/2016 0915   MCV 87 10/12/2013 1510   MCV 87.0 08/13/2009 0946   MCH 28.5 03/15/2016 0915   MCHC 32.7 03/15/2016 0915   RDW 13.4 03/15/2016 0915   RDW 13.6 10/12/2013 1510   RDW 13.8 08/13/2009 0946   LYMPHSABS 1.6 03/28/2015 1700   LYMPHSABS 1.5 08/13/2009 0946   MONOABS 0.4 03/28/2015 1700   MONOABS 0.2 08/13/2009 0946   EOSABS 0.1 03/28/2015 1700   EOSABS 0.1 08/13/2009 0946   BASOSABS 0.0 03/28/2015 1700   BASOSABS 0.0 08/13/2009 0946    Hgb A1C Lab Results  Component Value Date   HGBA1C 6.8 (H) 03/15/2016            Assessment & Plan:   Preventative Health Maintenance:  Encouraged her to get a flu shot in the fall Tetanus UTD She declines pneumovax, prevnar, zostavax and shingrix She does not need pap smears Mammogram UTD She will get bone density with her next mammogram Colon Screening UTD Encouraged her to consume a balanced diet and exercise regimen Advised her to see an eye doctor and dentist annually Will check CBC, CMET, Lipid, A1C and Vit D today  RTC in 1 year, sooner if needed Webb Silversmith, NP

## 2016-09-29 NOTE — Assessment & Plan Note (Signed)
Improved No CPAP  Will monitor

## 2016-09-29 NOTE — Assessment & Plan Note (Signed)
CMET and Lipid profile today Encouraged her to consume a low fat diet. Continue Pravastatin for now 

## 2016-09-29 NOTE — Assessment & Plan Note (Signed)
A1C today No microalbumin secondary to ARB therapy Encouraged her to consume a low carb diet and exercise for weight loss Foot exam today Encouraged yearly eye exams She declines vaccines Continue Metformin for now

## 2016-09-29 NOTE — Assessment & Plan Note (Signed)
Continue Zonisamide and Ibuprofen prn

## 2016-09-29 NOTE — Assessment & Plan Note (Signed)
She will continue to follow with oncology. 

## 2016-09-29 NOTE — Patient Instructions (Signed)
Health Maintenance for Postmenopausal Women Menopause is a normal process in which your reproductive ability comes to an end. This process happens gradually over a span of months to years, usually between the ages of 22 and 9. Menopause is complete when you have missed 12 consecutive menstrual periods. It is important to talk with your health care provider about some of the most common conditions that affect postmenopausal women, such as heart disease, cancer, and bone loss (osteoporosis). Adopting a healthy lifestyle and getting preventive care can help to promote your health and wellness. Those actions can also lower your chances of developing some of these common conditions. What should I know about menopause? During menopause, you may experience a number of symptoms, such as:  Moderate-to-severe hot flashes.  Night sweats.  Decrease in sex drive.  Mood swings.  Headaches.  Tiredness.  Irritability.  Memory problems.  Insomnia.  Choosing to treat or not to treat menopausal changes is an individual decision that you make with your health care provider. What should I know about hormone replacement therapy and supplements? Hormone therapy products are effective for treating symptoms that are associated with menopause, such as hot flashes and night sweats. Hormone replacement carries certain risks, especially as you become older. If you are thinking about using estrogen or estrogen with progestin treatments, discuss the benefits and risks with your health care provider. What should I know about heart disease and stroke? Heart disease, heart attack, and stroke become more likely as you age. This may be due, in part, to the hormonal changes that your body experiences during menopause. These can affect how your body processes dietary fats, triglycerides, and cholesterol. Heart attack and stroke are both medical emergencies. There are many things that you can do to help prevent heart disease  and stroke:  Have your blood pressure checked at least every 1-2 years. High blood pressure causes heart disease and increases the risk of stroke.  If you are 53-22 years old, ask your health care provider if you should take aspirin to prevent a heart attack or a stroke.  Do not use any tobacco products, including cigarettes, chewing tobacco, or electronic cigarettes. If you need help quitting, ask your health care provider.  It is important to eat a healthy diet and maintain a healthy weight. ? Be sure to include plenty of vegetables, fruits, low-fat dairy products, and lean protein. ? Avoid eating foods that are high in solid fats, added sugars, or salt (sodium).  Get regular exercise. This is one of the most important things that you can do for your health. ? Try to exercise for at least 150 minutes each week. The type of exercise that you do should increase your heart rate and make you sweat. This is known as moderate-intensity exercise. ? Try to do strengthening exercises at least twice each week. Do these in addition to the moderate-intensity exercise.  Know your numbers.Ask your health care provider to check your cholesterol and your blood glucose. Continue to have your blood tested as directed by your health care provider.  What should I know about cancer screening? There are several types of cancer. Take the following steps to reduce your risk and to catch any cancer development as early as possible. Breast Cancer  Practice breast self-awareness. ? This means understanding how your breasts normally appear and feel. ? It also means doing regular breast self-exams. Let your health care provider know about any changes, no matter how small.  If you are 40  or older, have a clinician do a breast exam (clinical breast exam or CBE) every year. Depending on your age, family history, and medical history, it may be recommended that you also have a yearly breast X-ray (mammogram).  If you  have a family history of breast cancer, talk with your health care provider about genetic screening.  If you are at high risk for breast cancer, talk with your health care provider about having an MRI and a mammogram every year.  Breast cancer (BRCA) gene test is recommended for women who have family members with BRCA-related cancers. Results of the assessment will determine the need for genetic counseling and BRCA1 and for BRCA2 testing. BRCA-related cancers include these types: ? Breast. This occurs in males or females. ? Ovarian. ? Tubal. This may also be called fallopian tube cancer. ? Cancer of the abdominal or pelvic lining (peritoneal cancer). ? Prostate. ? Pancreatic.  Cervical, Uterine, and Ovarian Cancer Your health care provider may recommend that you be screened regularly for cancer of the pelvic organs. These include your ovaries, uterus, and vagina. This screening involves a pelvic exam, which includes checking for microscopic changes to the surface of your cervix (Pap test).  For women ages 21-65, health care providers may recommend a pelvic exam and a Pap test every three years. For women ages 79-65, they may recommend the Pap test and pelvic exam, combined with testing for human papilloma virus (HPV), every five years. Some types of HPV increase your risk of cervical cancer. Testing for HPV may also be done on women of any age who have unclear Pap test results.  Other health care providers may not recommend any screening for nonpregnant women who are considered low risk for pelvic cancer and have no symptoms. Ask your health care provider if a screening pelvic exam is right for you.  If you have had past treatment for cervical cancer or a condition that could lead to cancer, you need Pap tests and screening for cancer for at least 20 years after your treatment. If Pap tests have been discontinued for you, your risk factors (such as having a new sexual partner) need to be  reassessed to determine if you should start having screenings again. Some women have medical problems that increase the chance of getting cervical cancer. In these cases, your health care provider may recommend that you have screening and Pap tests more often.  If you have a family history of uterine cancer or ovarian cancer, talk with your health care provider about genetic screening.  If you have vaginal bleeding after reaching menopause, tell your health care provider.  There are currently no reliable tests available to screen for ovarian cancer.  Lung Cancer Lung cancer screening is recommended for adults 69-62 years old who are at high risk for lung cancer because of a history of smoking. A yearly low-dose CT scan of the lungs is recommended if you:  Currently smoke.  Have a history of at least 30 pack-years of smoking and you currently smoke or have quit within the past 15 years. A pack-year is smoking an average of one pack of cigarettes per day for one year.  Yearly screening should:  Continue until it has been 15 years since you quit.  Stop if you develop a health problem that would prevent you from having lung cancer treatment.  Colorectal Cancer  This type of cancer can be detected and can often be prevented.  Routine colorectal cancer screening usually begins at  age 42 and continues through age 45.  If you have risk factors for colon cancer, your health care provider may recommend that you be screened at an earlier age.  If you have a family history of colorectal cancer, talk with your health care provider about genetic screening.  Your health care provider may also recommend using home test kits to check for hidden blood in your stool.  A small camera at the end of a tube can be used to examine your colon directly (sigmoidoscopy or colonoscopy). This is done to check for the earliest forms of colorectal cancer.  Direct examination of the colon should be repeated every  5-10 years until age 71. However, if early forms of precancerous polyps or small growths are found or if you have a family history or genetic risk for colorectal cancer, you may need to be screened more often.  Skin Cancer  Check your skin from head to toe regularly.  Monitor any moles. Be sure to tell your health care provider: ? About any new moles or changes in moles, especially if there is a change in a mole's shape or color. ? If you have a mole that is larger than the size of a pencil eraser.  If any of your family members has a history of skin cancer, especially at a young age, talk with your health care provider about genetic screening.  Always use sunscreen. Apply sunscreen liberally and repeatedly throughout the day.  Whenever you are outside, protect yourself by wearing long sleeves, pants, a wide-brimmed hat, and sunglasses.  What should I know about osteoporosis? Osteoporosis is a condition in which bone destruction happens more quickly than new bone creation. After menopause, you may be at an increased risk for osteoporosis. To help prevent osteoporosis or the bone fractures that can happen because of osteoporosis, the following is recommended:  If you are 46-71 years old, get at least 1,000 mg of calcium and at least 600 mg of vitamin D per day.  If you are older than age 55 but younger than age 65, get at least 1,200 mg of calcium and at least 600 mg of vitamin D per day.  If you are older than age 54, get at least 1,200 mg of calcium and at least 800 mg of vitamin D per day.  Smoking and excessive alcohol intake increase the risk of osteoporosis. Eat foods that are rich in calcium and vitamin D, and do weight-bearing exercises several times each week as directed by your health care provider. What should I know about how menopause affects my mental health? Depression may occur at any age, but it is more common as you become older. Common symptoms of depression  include:  Low or sad mood.  Changes in sleep patterns.  Changes in appetite or eating patterns.  Feeling an overall lack of motivation or enjoyment of activities that you previously enjoyed.  Frequent crying spells.  Talk with your health care provider if you think that you are experiencing depression. What should I know about immunizations? It is important that you get and maintain your immunizations. These include:  Tetanus, diphtheria, and pertussis (Tdap) booster vaccine.  Influenza every year before the flu season begins.  Pneumonia vaccine.  Shingles vaccine.  Your health care provider may also recommend other immunizations. This information is not intended to replace advice given to you by your health care provider. Make sure you discuss any questions you have with your health care provider. Document Released: 02/25/2005  Document Revised: 07/24/2015 Document Reviewed: 10/07/2014 Elsevier Interactive Patient Education  2018 [employer]  

## 2016-09-30 LAB — VITAMIN D 25 HYDROXY (VIT D DEFICIENCY, FRACTURES): VITD: 26.68 ng/mL — ABNORMAL LOW (ref 30.00–100.00)

## 2016-10-05 ENCOUNTER — Encounter: Payer: Self-pay | Admitting: Internal Medicine

## 2016-10-06 ENCOUNTER — Encounter: Payer: Self-pay | Admitting: Internal Medicine

## 2016-10-06 ENCOUNTER — Ambulatory Visit (INDEPENDENT_AMBULATORY_CARE_PROVIDER_SITE_OTHER): Payer: BLUE CROSS/BLUE SHIELD | Admitting: Internal Medicine

## 2016-10-06 VITALS — BP 134/92 | HR 71 | Temp 98.1°F | Wt 215.0 lb

## 2016-10-06 DIAGNOSIS — M79604 Pain in right leg: Secondary | ICD-10-CM

## 2016-10-06 DIAGNOSIS — M545 Low back pain, unspecified: Secondary | ICD-10-CM

## 2016-10-06 DIAGNOSIS — M79605 Pain in left leg: Secondary | ICD-10-CM | POA: Diagnosis not present

## 2016-10-06 LAB — HM DIABETES EYE EXAM

## 2016-10-06 NOTE — Patient Instructions (Signed)
Leg Cramps Leg cramps occur when a muscle or muscles tighten and you have no control over this tightening (involuntary muscle contraction). Muscle cramps can develop in any muscle, but the most common place is in the calf muscles of the leg. Those cramps can occur during exercise or when you are at rest. Leg cramps are painful, and they may last for a few seconds to a few minutes. Cramps may return several times before they finally stop. Usually, leg cramps are not caused by a serious medical problem. In many cases, the cause is not known. Some common causes include:  Overexertion.  Overuse from repetitive motions, or doing the same thing over and over.  Remaining in a certain position for a long period of time.  Improper preparation, form, or technique while performing a sport or an activity.  Dehydration.  Injury.  Side effects of some medicines.  Abnormally low levels of the salts and ions in your blood (electrolytes), especially potassium and calcium. These levels could be low if you are taking water pills (diuretics) or if you are pregnant.  Follow these instructions at home: Watch your condition for any changes. Taking the following actions may help to lessen any discomfort that you are feeling:  Stay well-hydrated. Drink enough fluid to keep your urine clear or pale yellow.  Try massaging, stretching, and relaxing the affected muscle. Do this for several minutes at a time.  For tight or tense muscles, use a warm towel, heating pad, or hot shower water directed to the affected area.  If you are sore or have pain after a cramp, applying ice to the affected area may relieve discomfort. ? Put ice in a plastic bag. ? Place a towel between your skin and the bag. ? Leave the ice on for 20 minutes, 2-3 times per day.  Avoid strenuous exercise for several days if you have been having frequent leg cramps.  Make sure that your diet includes the essential minerals for your muscles to  work normally.  Take medicines only as directed by your health care provider.  Contact a health care provider if:  Your leg cramps get more severe or more frequent, or they do not improve over time.  Your foot becomes cold, numb, or blue. This information is not intended to replace advice given to you by your health care provider. Make sure you discuss any questions you have with your health care provider. Document Released: 02/11/2004 Document Revised: 06/11/2015 Document Reviewed: 12/11/2013 Elsevier Interactive Patient Education  2018 [employer]  
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
## 2016-10-07 ENCOUNTER — Encounter: Payer: Self-pay | Admitting: Internal Medicine

## 2016-10-17 ENCOUNTER — Other Ambulatory Visit: Payer: Self-pay | Admitting: Cardiovascular Disease

## 2016-10-25 ENCOUNTER — Other Ambulatory Visit: Payer: Self-pay | Admitting: Internal Medicine

## 2016-10-26 ENCOUNTER — Other Ambulatory Visit: Payer: Self-pay | Admitting: Cardiovascular Disease

## 2016-10-27 ENCOUNTER — Telehealth: Payer: Self-pay | Admitting: Internal Medicine

## 2016-10-27 NOTE — Telephone Encounter (Signed)
[employer] faxed paperwork In Carrie Singh's in box for review and signature

## 2016-10-31 NOTE — Telephone Encounter (Signed)
Signed, given back to [facility]

## 2016-11-03 NOTE — Telephone Encounter (Signed)
Paperwork faxed 11/01/16 Copy for pt Copy for file Copy for scan Pt aware

## 2016-11-23 ENCOUNTER — Other Ambulatory Visit: Payer: Self-pay | Admitting: Internal Medicine

## 2016-12-12 ENCOUNTER — Other Ambulatory Visit: Payer: Self-pay

## 2016-12-12 DIAGNOSIS — Z170 Estrogen receptor positive status [ER+]: Secondary | ICD-10-CM

## 2016-12-12 DIAGNOSIS — C50511 Malignant neoplasm of lower-outer quadrant of right female breast: Secondary | ICD-10-CM

## 2016-12-12 MED ORDER — EXEMESTANE 25 MG PO TABS: 25.0000 mg | ORAL_TABLET | Freq: Every day | ORAL | 5 refills | Status: DC

## 2016-12-23 ENCOUNTER — Telehealth: Payer: Self-pay | Admitting: Hematology and Oncology

## 2016-12-23 ENCOUNTER — Ambulatory Visit (HOSPITAL_BASED_OUTPATIENT_CLINIC_OR_DEPARTMENT_OTHER): Payer: BLUE CROSS/BLUE SHIELD | Admitting: Hematology and Oncology

## 2016-12-23 DIAGNOSIS — M2550 Pain in unspecified joint: Secondary | ICD-10-CM | POA: Diagnosis not present

## 2016-12-23 DIAGNOSIS — Z170 Estrogen receptor positive status [ER+]: Secondary | ICD-10-CM | POA: Diagnosis not present

## 2016-12-23 DIAGNOSIS — C50511 Malignant neoplasm of lower-outer quadrant of right female breast: Secondary | ICD-10-CM

## 2016-12-23 NOTE — Assessment & Plan Note (Signed)
Right simple mastectomy: Grade 2 IDC with DCIS 1.5 cm, margins -0/3 lymph nodes, T1b N0 stage IA, ER 100%, PR 90%, Ki-67 10%, HER-2 negative ratio 1.28 Oncotype DX score 11:7% risk of recurrence  Current treatment: Letrozole 2.5 mg daily started 04/2016 switched to exemestane 05/26/2016 Breast Cancer Surveillance: 1. Breast exam 12/23/2016: Normal 2. Mammogram will be done in January 2019  Exemestane toxicities: Muscle and joint pains  Return to clinic in 1 year for follow-up

## 2016-12-23 NOTE — Progress Notes (Signed)
Patient Care Team: Jearld Fenton, NP as PCP - General (Internal Medicine) Minna Merritts, MD as Consulting Physician (Cardiology) Nicholas Lose, MD as Consulting Physician (Hematology and Oncology) Alphonsa Overall, MD as Consulting Physician (General Surgery) Delice Bison Charlestine Massed, NP as Nurse Practitioner (Hematology and Oncology)  DIAGNOSIS:  Encounter Diagnosis  Name Primary?  . Malignant neoplasm of lower-outer quadrant of right breast of female, estrogen receptor positive ([place])     SUMMARY OF ONCOLOGIC HISTORY:   Malignant neoplasm of lower-outer quadrant of right breast of female, estrogen receptor positive ([place])   12/18/2007 Initial Diagnosis    Right breast cancer treated with lumpectomy followed by radiation and 5 years of antiestrogen therapy with aromatase inhibitor      02/02/2016 Relapse/Recurrence    Right breast biopsy 6:00 position: IDC with DCIS grade 2, ER 100%, PR 90%, Ki-67 10%, HER-2 negative ratio 1.28, screening detected right breast lumps 0.5 cm and 0.3 cm, T1a N0 stage IA clinical stage      03/18/2016 Surgery    Right simple mastectomy: Grade 2 IDC with DCIS 1.5 cm, margins -0/3 lymph nodes, T1b N0 stage IA, ER 100%, PR 90%, Ki-67 10%, HER-2 negative ratio 1.28      03/18/2016 Oncotype testing    11/7%      04/2016 -  Anti-estrogen oral therapy    Letrozole 2.58m daily switch to exemestane 25 mg daily May 2018       CHIEF COMPLIANT: Follow-up on exemestane  INTERVAL HISTORY: Carrie Singh a 61year old with above-mentioned history of right breast cancer treated with mastectomy and is currently on exemestane.  She is tolerating it fairly well.  She is complaining of lots of muscle aches and pains in her lower extremities.  She denies any lumps or nodules in the breast.  REVIEW OF SYSTEMS:   Constitutional: Denies fevers, chills or abnormal weight loss Eyes: Denies blurriness of vision Ears, nose, mouth, throat, and face: Denies mucositis or  sore throat Respiratory: Denies cough, dyspnea or wheezes Cardiovascular: Denies palpitation, chest discomfort Gastrointestinal:  Denies nausea, heartburn or change in bowel habits Skin: Denies abnormal skin rashes Lymphatics: Denies new lymphadenopathy or easy bruising Neurological:Denies numbness, tingling or new weaknesses Behavioral/Psych: Mood is stable, no new changes  Extremities: No lower extremity edema Breast:  denies any pain or lumps or nodules in either breasts All other systems were reviewed with the patient and are negative.  I have reviewed the past medical history, past surgical history, social history and family history with the patient and they are unchanged from previous note.  ALLERGIES:  is allergic to no known allergies.  MEDICATIONS:  Current Outpatient Medications  Medication Sig Dispense Refill  . Aspirin-Acetaminophen-Caffeine (GOODY HEADACHE PO) Take 1 Package by mouth 3 (three) times daily as needed (migraine).    . chlorthalidone (HYGROTON) 25 MG tablet TAKE 1 TABLET (25 MG TOTAL) BY MOUTH DAILY. 30 tablet 4  . chlorthalidone (HYGROTON) 25 MG tablet Take 1 tablet (25 mg total) by mouth daily. 90 tablet 2  . citalopram (CELEXA) 20 MG tablet TAKE 1 TABLET (20 MG TOTAL) BY MOUTH DAILY. 30 tablet 6  . exemestane (AROMASIN) 25 MG tablet Take 1 tablet (25 mg total) by mouth daily after breakfast. 30 tablet 5  . hydrALAZINE (APRESOLINE) 100 MG tablet TAKE 1 TABLET (100 MG TOTAL) BY MOUTH 3 (THREE) TIMES DAILY. 270 tablet 0  . ibuprofen (ADVIL,MOTRIN) 600 MG tablet Take 1 tablet (600 mg total) by mouth every 6 (six)  hours as needed for mild pain. 30 tablet 1  . irbesartan (AVAPRO) 300 MG tablet Take 300 mg by mouth daily.     . irbesartan (AVAPRO) 300 MG tablet TAKE 1 TABLET (300 MG TOTAL) BY MOUTH DAILY. 90 tablet 2  . metFORMIN (GLUCOPHAGE) 1000 MG tablet TAKE 1 TABLET BY MOUTH TWICE A DAY WITH A MEAL 60 tablet 3  . pravastatin (PRAVACHOL) 40 MG tablet TAKE 1  TABLET (40 MG TOTAL) BY MOUTH DAILY. MUST SCHEDULE ANNUAL PHYSICAL 90 tablet 1  . zonisamide (ZONEGRAN) 50 MG capsule Take 50 mg by mouth daily as needed (migraine).      No current facility-administered medications for this visit.     PHYSICAL EXAMINATION: ECOG PERFORMANCE STATUS: 1 - Symptomatic but completely ambulatory  Vitals:   12/23/16 0807  BP: (!) 163/95  Pulse: 82  Resp: 19  Temp: 98.5 F (36.9 C)  SpO2: 100%   Filed Weights   12/23/16 0807  Weight: 218 lb 6.4 oz (99.1 kg)    GENERAL:alert, no distress and comfortable SKIN: skin color, texture, turgor are normal, no rashes or significant lesions EYES: normal, Conjunctiva are pink and non-injected, sclera clear OROPHARYNX:no exudate, no erythema and lips, buccal mucosa, and tongue normal  NECK: supple, thyroid normal size, non-tender, without nodularity LYMPH:  no palpable lymphadenopathy in the cervical, axillary or inguinal LUNGS: clear to auscultation and percussion with normal breathing effort HEART: regular rate & rhythm and no murmurs and no lower extremity edema ABDOMEN:abdomen soft, non-tender and normal bowel sounds MUSCULOSKELETAL:no cyanosis of digits and no clubbing  NEURO: alert & oriented x 3 with fluent speech, no focal motor/sensory deficits EXTREMITIES: No lower extremity edema BREAST: No palpable masses or nodules in either right or left breasts. No palpable axillary supraclavicular or infraclavicular adenopathy no breast tenderness or nipple discharge. (exam performed in the presence of a chaperone)  LABORATORY DATA:  I have reviewed the data as listed   Chemistry      Component Value Date/Time   NA 138 09/29/2016 1246   NA 146 (H) 01/20/2015 1457   NA 143 10/12/2013 1510   K 4.0 09/29/2016 1246   K 3.9 10/12/2013 1510   CL 102 09/29/2016 1246   CL 109 (H) 10/12/2013 1510   CO2 29 09/29/2016 1246   CO2 29 10/12/2013 1510   BUN 20 09/29/2016 1246   BUN 17 01/20/2015 1457   BUN 16  10/12/2013 1510   CREATININE 1.12 09/29/2016 1246   CREATININE 1.15 (H) 10/12/2015 1603      Component Value Date/Time   CALCIUM 9.7 09/29/2016 1246   CALCIUM 9.4 10/12/2013 1510   ALKPHOS 57 09/29/2016 1246   ALKPHOS 67 10/12/2013 1510   AST 28 09/29/2016 1246   AST 22 10/12/2013 1510   ALT 22 09/29/2016 1246   ALT 22 10/12/2013 1510   BILITOT 1.0 09/29/2016 1246   BILITOT 0.7 01/20/2015 1457   BILITOT 0.9 10/12/2013 1510       Lab Results  Component Value Date   WBC 5.3 09/29/2016   HGB 12.1 09/29/2016   HCT 36.5 09/29/2016   MCV 86.9 09/29/2016   PLT 293.0 09/29/2016   NEUTROABS 3.8 03/28/2015    ASSESSMENT & PLAN:  Malignant neoplasm of lower-outer quadrant of right breast of female, estrogen receptor positive ([facility]) Right simple mastectomy: Grade 2 IDC with DCIS 1.5 cm, margins -0/3 lymph nodes, T1b N0 stage IA, ER 100%, PR 90%, Ki-67 10%, HER-2 negative ratio 1.28 Oncotype DX score  11:7% risk of recurrence  Current treatment: Letrozole 2.5 mg daily started 04/2016 switched to exemestane 05/26/2016 Breast Cancer Surveillance: 1. Breast exam 12/23/2016: Normal 2. Mammogram and bone density will be done in January 2019  Exemestane toxicities: Muscle and joint pains I instructed her to take exemestane at bedtime.  I also instructed her to take vitamin D. If these measures still continue to have trouble for her muscles and joints, we may have to switch her to another antiestrogen therapy. Since she tried letrozole and exemestane, we can try anastrozole. If she cannot tolerate anastrozole either, then we will have to switch her to tamoxifen.  She will call us in a couple of weeks to tell us if these changes have helped and whether or not she wants to switch to a different antiestrogen pill. Return to clinic in 6 months for follow-up  I spent 25 minutes talking to the patient of which more than half was spent in counseling and coordination of care.  No orders of the  defined types were placed in this encounter.  The patient has a good understanding of the overall plan. she agrees with it. she will call with any problems that may develop before the next visit here.   Rulon Eisenmenger, MD 12/23/16

## 2016-12-23 NOTE — Telephone Encounter (Signed)
Gave patient AVs and calendar of upcoming June appointments. °

## 2017-01-12 ENCOUNTER — Telehealth: Payer: Self-pay | Admitting: Internal Medicine

## 2017-01-12 NOTE — Telephone Encounter (Signed)
[facility] faxed paperwork In [facility]ina's in box  For review and signature

## 2017-01-12 NOTE — Telephone Encounter (Signed)
Done, given back to [name] 

## 2017-01-12 NOTE — Telephone Encounter (Signed)
Paperwork faxed °

## 2017-01-24 ENCOUNTER — Telehealth: Payer: Self-pay

## 2017-01-24 ENCOUNTER — Other Ambulatory Visit: Payer: Self-pay

## 2017-01-24 MED ORDER — ANASTROZOLE 1 MG PO TABS: 1.0000 mg | ORAL_TABLET | Freq: Every day | ORAL | 0 refills | Status: DC

## 2017-01-24 NOTE — Telephone Encounter (Signed)
Dr Stephanie Coup from [facility] disability called requesting additional information as the forms have stated pt suffers from headaches and memory loss etc and there has been no mention of theses Sx in recent OV with you. I explained that pt sees specialists for those separate conditions... She is requesting call back from you with further questions

## 2017-01-24 NOTE — Telephone Encounter (Signed)
[phone] 

## 2017-01-24 NOTE — Telephone Encounter (Signed)
Do you have a contact phone number?

## 2017-01-24 NOTE — Progress Notes (Signed)
Pt requesting to get back on anastrozole since exemestane is very expensive for pt. She changed insurance this year and unable to afford the medication. Per Dr.Gudena's last progress note, okay to switch to anastrozole if needed. Will escribe anastrozole for pt today. Pt requesting a 90 day supply so she can get her medicine at a cheaper price. Pt appreciative of time and has no further needs at this time. Told pt to call and update with any side effects with anastrozole.

## 2017-01-27 NOTE — Telephone Encounter (Signed)
Tried to call back, was on hold for 10 minutes.

## 2017-01-31 ENCOUNTER — Telehealth: Payer: Self-pay | Admitting: Internal Medicine

## 2017-01-31 NOTE — Telephone Encounter (Signed)
Disability was declined. Paperwork given back to [employer]

## 2017-01-31 NOTE — Telephone Encounter (Signed)
[employer]  Faxed paperwork  They are reviewing pt long term disability  They want you to review there findings Paperwork in [name] in box

## 2017-02-01 ENCOUNTER — Ambulatory Visit (INDEPENDENT_AMBULATORY_CARE_PROVIDER_SITE_OTHER): Payer: BLUE CROSS/BLUE SHIELD | Admitting: Internal Medicine

## 2017-02-01 ENCOUNTER — Encounter: Payer: Self-pay | Admitting: Internal Medicine

## 2017-02-01 VITALS — BP 130/80 | HR 104 | Temp 99.4°F | Wt 212.0 lb

## 2017-02-01 DIAGNOSIS — J069 Acute upper respiratory infection, unspecified: Secondary | ICD-10-CM | POA: Diagnosis not present

## 2017-02-01 DIAGNOSIS — J029 Acute pharyngitis, unspecified: Secondary | ICD-10-CM | POA: Diagnosis not present

## 2017-02-01 MED ORDER — AZITHROMYCIN 250 MG PO TABS: ORAL_TABLET | ORAL | 0 refills | Status: DC

## 2017-02-01 MED ORDER — METHYLPREDNISOLONE ACETATE 80 MG/ML IJ SUSP
80.0000 mg | Freq: Once | INTRAMUSCULAR | Status: AC
  Administered 2017-02-01: 80 mg via INTRAMUSCULAR

## 2017-02-01 MED ORDER — METFORMIN HCL 1000 MG PO TABS: 1000.0000 mg | ORAL_TABLET | Freq: Two times a day (BID) | ORAL | 0 refills | Status: DC

## 2017-02-01 NOTE — Patient Instructions (Signed)
Upper Respiratory Infection, Adult Most upper respiratory infections (URIs) are caused by a virus. A URI affects the nose, throat, and upper air passages. The most common type of URI is often called "the common cold." Follow these instructions at home:  Take medicines only as told by your doctor.  Gargle warm saltwater or take cough drops to comfort your throat as told by your doctor.  Use a warm mist humidifier or inhale steam from a shower to increase air moisture. This may make it easier to breathe.  Drink enough fluid to keep your pee (urine) clear or pale yellow.  Eat soups and other clear broths.  Have a healthy diet.  Rest as needed.  Go back to work when your fever is gone or your doctor says it is okay. ? You may need to stay home longer to avoid giving your URI to others. ? You can also wear a face mask and wash your hands often to prevent spread of the virus.  Use your inhaler more if you have asthma.  Do not use any tobacco products, including cigarettes, chewing tobacco, or electronic cigarettes. If you need help quitting, ask your doctor. Contact a doctor if:  You are getting worse, not better.  Your symptoms are not helped by medicine.  You have chills.  You are getting more short of breath.  You have brown or red mucus.  You have yellow or brown discharge from your nose.  You have pain in your face, especially when you bend forward.  You have a fever.  You have puffy (swollen) neck glands.  You have pain while swallowing.  You have white areas in the back of your throat. Get help right away if:  You have very bad or constant: ? Headache. ? Ear pain. ? Pain in your forehead, behind your eyes, and over your cheekbones (sinus pain). ? Chest pain.  You have long-lasting (chronic) lung disease and any of the following: ? Wheezing. ? Long-lasting cough. ? Coughing up blood. ? A change in your usual mucus.  You have a stiff neck.  You have  changes in your: ? Vision. ? Hearing. ? Thinking. ? Mood. This information is not intended to replace advice given to you by your health care provider. Make sure you discuss any questions you have with your health care provider. Document Released: 06/22/2007 Document Revised: 09/06/2015 Document Reviewed: 04/10/2013 Elsevier Interactive Patient Education  2018 Elsevier Inc.  

## 2017-02-01 NOTE — Telephone Encounter (Signed)
Carrie Singh needs to review findings. Mark yes if she agrees Doctor, general practice no if she doesn't agree  Explain why  Paperwork in [facility] in box

## 2017-02-01 NOTE — Addendum Note (Signed)
Addended by: Lurlean Nanny on: 02/01/2017 02:32 PM   Modules accepted: Orders

## 2017-02-01 NOTE — Progress Notes (Signed)
HPI  Pt presents to the clinic today with c/o nasal congestion, sore throat and cough. This started 5 days ago. She is blowing yellow/green mucous out of her nose. She denies difficulty swallowing. The cough is productive of yellow/green mucous. She denies fever, but has had chills and body aches. She has tried Robitussin with minimal relief. She has not had sick contacts that she is aware of.  Review of Systems      Past Medical History:  Diagnosis Date  . Breast cancer ([place])   . Breast cancer, right breast ([place]) 12/2007   a. s/p chemo, radiation, and lumpectomy with negative sentinel lymph node biopsy   . Depression   . Diabetes mellitus without complication ([place])   . GERD (gastroesophageal reflux disease)   . Hyperlipidemia   . Hypertension    resistant, negative renal Dopplers and negative CT angiogram  . Migraines   . Obesity   . Seizures ([place])    last seizure  1 week ago: states she jumps for a second and it's gone    Family History  Problem Relation Age of Onset  . Stroke Mother   . Heart disease Father   . Asthma Father   . Cancer Father        colon  . Cancer Sister        lung  . Cancer Brother        colon  . Breast cancer Other 48  . Diabetes Neg Hx     Social History   Socioeconomic History  . Marital status: Married    Spouse name: [name]  . Number of children: Not on file  . Years of education: 12+  . Highest education level: Not on file  Social Needs  . Financial resource strain: Not on file  . Food insecurity - worry: Not on file  . Food insecurity - inability: Not on file  . Transportation needs - medical: Not on file  . Transportation needs - non-medical: Not on file  Occupational History  . Occupation: [employer]  Tobacco Use  . Smoking status: Never Smoker  . Smokeless tobacco: Never Used  Substance and Sexual Activity  . Alcohol use: No    Alcohol/week: 0.0 oz  . Drug use: No  . Sexual activity: Yes  Other Topics Concern  . Not on file   Social History Narrative   Lives at home with husband.   Caffeine use: 1-2 drinks per month         Epworth Sleepiness Scale = 15 (as of 01/01/15)    Allergies  Allergen Reactions  . No Known Allergies      Constitutional:  Denies headache, fatigue, fever or abrupt weight changes.  HEENT:  Positive nasal congestion, sore throat. Denies eye redness, eye pain, pressure behind the eyes, facial pain, ear pain, ringing in the ears, wax buildup, runny nose or bloody nose. Respiratory: Positive cough. Denies difficulty breathing or shortness of breath.  Cardiovascular: Denies chest pain, chest tightness, palpitations or swelling in the hands or feet.   No other specific complaints in a complete review of systems (except as listed in HPI above).  Objective:   BP 130/80   Pulse (!) 104   Temp 99.4 F (37.4 C) (Oral)   Wt 212 lb (96.2 kg)   SpO2 98%   BMI 35.01 kg/m   Wt Readings from Last 3 Encounters:  02/01/17 212 lb (96.2 kg)  12/23/16 218 lb 6.4 oz (99.1 kg)  10/06/16 215 lb (  97.5 kg)     General: Appears her stated age, ill appearing, in NAD. HEENT: Head: normal shape and size, no sinus tenderness noted; Ears: Tm's gray and intact, normal light reflex; Nose: mucosa pink and moist, septum midline; Throat/Mouth: + PND. Teeth present, mucosa erythematous and moist, no exudate noted, no lesions or ulcerations noted.  Neck: No cervical lymphadenopathy.  Cardiovascular: Tachycardic with normal rhythm. S1,S2 noted.  No murmur, rubs or gallops noted.  Pulmonary/Chest: Normal effort and positive vesicular breath sounds. No respiratory distress. No wheezes, rales or ronchi noted.       Assessment & Plan:   Upper Respiratory Infection:  Get some rest and drink plenty of water Do salt water gargles for the sore throat eRx for Azithromax x 5 days 80 mg Depo IM today  RTC as needed or if symptoms persist.   Webb Silversmith, NP

## 2017-02-02 ENCOUNTER — Encounter: Payer: Self-pay | Admitting: Internal Medicine

## 2017-02-16 ENCOUNTER — Encounter: Payer: Self-pay | Admitting: Internal Medicine

## 2017-02-17 ENCOUNTER — Encounter: Payer: Self-pay | Admitting: Internal Medicine

## 2017-02-17 NOTE — Telephone Encounter (Signed)
Letter done, please fax ASAP

## 2017-02-21 ENCOUNTER — Other Ambulatory Visit: Payer: Self-pay | Admitting: Hematology and Oncology

## 2017-02-21 NOTE — Telephone Encounter (Signed)
Paperwork faxed °

## 2017-02-27 ENCOUNTER — Encounter: Payer: Self-pay | Admitting: Internal Medicine

## 2017-02-28 MED ORDER — PREDNISONE 10 MG PO TABS
ORAL_TABLET | ORAL | 0 refills | Status: DC
Start: 2017-02-28 — End: 2017-05-15

## 2017-03-10 ENCOUNTER — Other Ambulatory Visit: Payer: Self-pay

## 2017-03-10 MED ORDER — HYDRALAZINE HCL 100 MG PO TABS: 100.0000 mg | ORAL_TABLET | Freq: Three times a day (TID) | ORAL | 3 refills | Status: DC

## 2017-04-19 ENCOUNTER — Telehealth: Payer: Self-pay | Admitting: Cardiovascular Disease

## 2017-04-19 NOTE — Telephone Encounter (Signed)
[facility] called stating that the HCTZ would be cheaper than the chlorthalidone. The patient and [facility] would like to know if she can be switched to hydrochlorothiazide. Message routed to the provider.

## 2017-04-19 NOTE — Telephone Encounter (Signed)
New Message:    Pt is taking Chlorthalidone now,pharmacist wants to know if pt can take HCTZ,? Pt insurance will pay for the HCTZ.and it is less expensive.

## 2017-04-20 NOTE — Telephone Encounter (Signed)
That is fine with me.

## 2017-04-28 ENCOUNTER — Other Ambulatory Visit: Payer: Self-pay | Admitting: Internal Medicine

## 2017-05-03 NOTE — Telephone Encounter (Signed)
25 mg is fine

## 2017-05-03 NOTE — Telephone Encounter (Signed)
What dose of HCTZ will pt need?

## 2017-05-04 MED ORDER — HYDROCHLOROTHIAZIDE 25 MG PO TABS: 25.0000 mg | ORAL_TABLET | Freq: Every day | ORAL | 1 refills | Status: DC

## 2017-05-04 NOTE — Telephone Encounter (Signed)
HCTZ 25 mg QD sent to pt preferred pharmacy.

## 2017-05-04 NOTE — Telephone Encounter (Signed)
lmtcb to inform pt.

## 2017-05-05 ENCOUNTER — Encounter: Payer: Self-pay | Admitting: Cardiovascular Disease

## 2017-05-05 NOTE — Telephone Encounter (Signed)
Follow Up: ° ° ° °Returning your call from yesterday. °

## 2017-05-10 ENCOUNTER — Other Ambulatory Visit: Payer: Self-pay

## 2017-05-10 MED ORDER — PRAVASTATIN SODIUM 40 MG PO TABS: 40.0000 mg | ORAL_TABLET | Freq: Every day | ORAL | 1 refills | Status: DC

## 2017-05-10 MED ORDER — METFORMIN HCL 1000 MG PO TABS: 1000.0000 mg | ORAL_TABLET | Freq: Two times a day (BID) | ORAL | 0 refills | Status: DC

## 2017-05-10 NOTE — Telephone Encounter (Signed)
[facility] mail order requests 90 day refills on the following meds:  Arimidex - filled by oncology  Irbesartan - filled by Dr. Gwenlyn Found cardiology Metformin - We can fill  Hydralazine - filled by Dr. Gwenlyn Found Cardiology Pravastatin - we can fill  Chlorthalidone - has been discontinued per Cardiology.  Patient last seen for general exam in 09/2016.  I did call and make her aware that we can only fill the Metformin and the Pravastatin as other providers manage the other medications.  She will communicate this with [facility].    The metformin was just sent in for #180, 1 refill to [facility] and patient picked up on 4/21.  I did cancel the additional refill and will just send in #180, 0 additional to the mail order to carry her through the fall.  Also, will send in Pravastatin #90,1 refill to carry her until her fall annual exam.

## 2017-05-11 ENCOUNTER — Encounter: Payer: Self-pay | Admitting: Hematology and Oncology

## 2017-05-11 ENCOUNTER — Encounter: Payer: Self-pay | Admitting: Cardiovascular Disease

## 2017-05-11 ENCOUNTER — Other Ambulatory Visit: Payer: Self-pay

## 2017-05-11 MED ORDER — ANASTROZOLE 1 MG PO TABS: 1.0000 mg | ORAL_TABLET | Freq: Every day | ORAL | 0 refills | Status: DC

## 2017-05-12 ENCOUNTER — Other Ambulatory Visit: Payer: Self-pay

## 2017-05-12 MED ORDER — HYDRALAZINE HCL 100 MG PO TABS: 100.0000 mg | ORAL_TABLET | Freq: Three times a day (TID) | ORAL | 3 refills | Status: DC

## 2017-05-12 MED ORDER — IRBESARTAN 300 MG PO TABS: 300.0000 mg | ORAL_TABLET | Freq: Every day | ORAL | 2 refills | Status: DC

## 2017-05-15 ENCOUNTER — Ambulatory Visit: Payer: Medicare Other | Admitting: Internal Medicine

## 2017-05-15 ENCOUNTER — Encounter: Payer: Self-pay | Admitting: Internal Medicine

## 2017-05-15 NOTE — Progress Notes (Signed)
   Subjective:    Patient ID: Carrie Singh, female    DOB: 1955-03-13, 62 y.o.   MRN: [id number]  HPI Erroneous encounter  Objective:   Physical Exam        Assessment & Plan:

## 2017-05-17 ENCOUNTER — Other Ambulatory Visit: Payer: Self-pay | Admitting: Internal Medicine

## 2017-05-30 NOTE — Telephone Encounter (Signed)
This encounter was created in error - please disregard.

## 2017-05-31 ENCOUNTER — Emergency Department (HOSPITAL_COMMUNITY): Payer: Medicare Other

## 2017-05-31 ENCOUNTER — Emergency Department (HOSPITAL_COMMUNITY)
Admission: EM | Admit: 2017-05-31 | Discharge: 2017-05-31 | Disposition: A | Discharge status: Home / Self Care | Payer: Medicare Other | Attending: Emergency Medicine | Admitting: Emergency Medicine

## 2017-05-31 ENCOUNTER — Other Ambulatory Visit: Payer: Self-pay

## 2017-05-31 ENCOUNTER — Encounter (HOSPITAL_COMMUNITY): Payer: Self-pay | Admitting: *Deleted

## 2017-05-31 DIAGNOSIS — R42 Dizziness and giddiness: Secondary | ICD-10-CM | POA: Diagnosis present

## 2017-05-31 DIAGNOSIS — I10 Essential (primary) hypertension: Secondary | ICD-10-CM | POA: Diagnosis not present

## 2017-05-31 DIAGNOSIS — E119 Type 2 diabetes mellitus without complications: Secondary | ICD-10-CM | POA: Insufficient documentation

## 2017-05-31 DIAGNOSIS — Z7984 Long term (current) use of oral hypoglycemic drugs: Secondary | ICD-10-CM | POA: Insufficient documentation

## 2017-05-31 DIAGNOSIS — Z853 Personal history of malignant neoplasm of breast: Secondary | ICD-10-CM | POA: Diagnosis not present

## 2017-05-31 DIAGNOSIS — Z79899 Other long term (current) drug therapy: Secondary | ICD-10-CM | POA: Diagnosis not present

## 2017-05-31 LAB — I-STAT CHEM 8, ED
BUN: 16 mg/dL (ref 6–20)
Calcium, Ion: 1.2 mmol/L (ref 1.15–1.40)
Chloride: 102 mmol/L (ref 101–111)
Creatinine, Ser: 1 mg/dL (ref 0.44–1.00)
Glucose, Bld: 132 mg/dL — ABNORMAL HIGH (ref 65–99)
HCT: 36 % (ref 36.0–46.0)
Hemoglobin: 12.2 g/dL (ref 12.0–15.0)
Potassium: 3.5 mmol/L (ref 3.5–5.1)
Sodium: 141 mmol/L (ref 135–145)
TCO2: 27 mmol/L (ref 22–32)

## 2017-05-31 LAB — CBC WITH DIFFERENTIAL/PLATELET
Abs Immature Granulocytes: 0 10*3/uL (ref 0.0–0.1)
Basophils Absolute: 0 10*3/uL (ref 0.0–0.1)
Basophils Relative: 1 %
Eosinophils Absolute: 0 10*3/uL (ref 0.0–0.7)
Eosinophils Relative: 1 %
HCT: 38.2 % (ref 36.0–46.0)
Hemoglobin: 12.7 g/dL (ref 12.0–15.0)
Immature Granulocytes: 0 %
Lymphocytes Relative: 21 %
Lymphs Abs: 1.1 10*3/uL (ref 0.7–4.0)
MCH: 29 pg (ref 26.0–34.0)
MCHC: 33.2 g/dL (ref 30.0–36.0)
MCV: 87.2 fL (ref 78.0–100.0)
Monocytes Absolute: 0.3 10*3/uL (ref 0.1–1.0)
Monocytes Relative: 5 %
Neutro Abs: 3.8 10*3/uL (ref 1.7–7.7)
Neutrophils Relative %: 72 %
Platelets: 268 10*3/uL (ref 150–400)
RBC: 4.38 MIL/uL (ref 3.87–5.11)
RDW: 12.7 % (ref 11.5–15.5)
WBC: 5.3 10*3/uL (ref 4.0–10.5)

## 2017-05-31 LAB — I-STAT TROPONIN, ED: Troponin i, poc: 0 ng/mL (ref 0.00–0.08)

## 2017-05-31 MED ORDER — LORAZEPAM 2 MG/ML IJ SOLN: 1.0000 mg | Freq: Once | INTRAMUSCULAR | Status: DC

## 2017-05-31 MED ORDER — HYDRALAZINE HCL 10 MG PO TABS
10.0000 mg | ORAL_TABLET | Freq: Once | ORAL | Status: AC
  Administered 2017-05-31: 10 mg via ORAL
  Filled 2017-05-31: qty 1

## 2017-05-31 MED ORDER — ONDANSETRON HCL 4 MG/2ML IJ SOLN
4.0000 mg | Freq: Once | INTRAMUSCULAR | Status: AC
  Administered 2017-05-31: 4 mg via INTRAVENOUS
  Filled 2017-05-31: qty 2

## 2017-05-31 MED ORDER — SODIUM CHLORIDE 0.9 % IV BOLUS
1000.0000 mL | Freq: Once | INTRAVENOUS | Status: AC
  Administered 2017-05-31: 1000 mL via INTRAVENOUS

## 2017-05-31 MED ORDER — ONDANSETRON 4 MG PO TBDP: 4.0000 mg | ORAL_TABLET | Freq: Three times a day (TID) | ORAL | 0 refills | Status: DC | PRN

## 2017-05-31 MED ORDER — MECLIZINE HCL 25 MG PO TABS
25.0000 mg | ORAL_TABLET | Freq: Once | ORAL | Status: AC
  Administered 2017-05-31: 25 mg via ORAL
  Filled 2017-05-31: qty 1

## 2017-05-31 MED ORDER — MECLIZINE HCL 25 MG PO TABS: 25.0000 mg | ORAL_TABLET | Freq: Three times a day (TID) | ORAL | 0 refills | Status: DC | PRN

## 2017-05-31 NOTE — ED Provider Notes (Signed)
[facility] EMERGENCY DEPARTMENT Provider Note   CSN: [id number] Arrival date & time: 05/31/17  1100     History   Chief Complaint No chief complaint on file.   HPI Carrie Singh is a 62 y.o. female.  HPI   62 year old female with history of diabetes, hypertension, hyperlipidemia, seizures, breast cancer status post chemoradiation and lumpectomy brought here via EMS for evaluation of weakness.  Patient report  this morning, while she was on a computer, she developed acute onset of dizziness in which she feels very wobbly as if she was about to fall off her chair.  She then felt nauseous, vomited several times, as well as having some loose stools.  She report global weakness.  Last night she developed left-sided headache and left arm pain lasting for several hours and resolved after she went to sleep.  Headache and arm pain is not new she has had that in the past.  Her dizziness is still persistence and she has never expecting this before.  She denies any recent sickness or recent medication changes.  Denies alcohol or drug use.  She currently denies having headache, neck pain, chest pain, trouble breathing, abdominal pain, back pain, focal numbness.  She received chemoradiation lumpectomy last year for her breast cancer.  She has never had a stroke in the past.  Past Medical History:  Diagnosis Date  . Breast cancer ([place])   . Breast cancer, right breast ([place]) 12/2007   a. s/p chemo, radiation, and lumpectomy with negative sentinel lymph node biopsy   . Depression   . Diabetes mellitus without complication ([place])   . GERD (gastroesophageal reflux disease)   . Hyperlipidemia   . Hypertension    resistant, negative renal Dopplers and negative CT angiogram  . Migraines   . Obesity   . Seizures ([place])    last seizure  1 week ago: states she jumps for a second and it's gone    Patient Active Problem List   Diagnosis Date Noted  . Intractable episodic cluster headache  12/04/2014  . Depression with somatization 12/04/2014  . Hypersomnia with sleep apnea 12/04/2014  . Obesity (BMI 30-39.9) 04/24/2013  . HTN (hypertension) 04/24/2013  . HLD (hyperlipidemia) 04/24/2013  . DM2 (diabetes mellitus, type 2) ([place]) 04/24/2013  . Malignant neoplasm of lower-outer quadrant of right breast of female, estrogen receptor positive ([place]) 12/18/2007    Past Surgical History:  Procedure Laterality Date  . ABDOMINAL HYSTERECTOMY    . BREAST LUMPECTOMY    . CYST EXCISION    . FOOT SURGERY    . MASTECTOMY W/ SENTINEL NODE BIOPSY Right 03/18/2016   Procedure: RIGHT MASTECTOMY WITH RIGHT AXILLARY SENTINEL LYMPH NODE BIOPSY;  Surgeon: Alphonsa Overall, MD;  Location: [facility];  Service: General;  Laterality: Right;  . ovary tumor    . vocal cord nodules       OB History   None      Home Medications    Prior to Admission medications   Medication Sig Start Date End Date Taking? Authorizing Provider  anastrozole (ARIMIDEX) 1 MG tablet Take 1 tablet (1 mg total) by mouth daily. 05/11/17   Nicholas Lose, MD  Aspirin-Acetaminophen-Caffeine (GOODY HEADACHE PO) Take 1 Package by mouth 3 (three) times daily as needed (migraine).    [provider]  citalopram (CELEXA) 20 MG tablet TAKE 1 TABLET (20 MG TOTAL) BY MOUTH DAILY. 09/28/15   Melvenia Beam, MD  hydrALAZINE (APRESOLINE) 100 MG tablet Take 1 tablet (  100 mg total) by mouth 3 (three) times daily. 05/12/17   Lorretta Harp, MD  hydrochlorothiazide (HYDRODIURIL) 25 MG tablet Take 1 tablet (25 mg total) by mouth daily. 05/04/17 08/02/17  Lorretta Harp, MD  ibuprofen (ADVIL,MOTRIN) 600 MG tablet Take 1 tablet (600 mg total) by mouth every 6 (six) hours as needed for mild pain. 03/19/16   Stark Klein, MD  irbesartan (AVAPRO) 300 MG tablet Take 1 tablet (300 mg total) by mouth daily. 05/12/17   Lorretta Harp, MD  metFORMIN (GLUCOPHAGE) 1000 MG tablet Take 1 tablet (1,000 mg total) by mouth 2 (two) times daily with a  meal. 05/10/17   Baity, Coralie Keens, NP  pravastatin (PRAVACHOL) 40 MG tablet Take 1 tablet (40 mg total) by mouth daily. 05/10/17   Jearld Fenton, NP  pravastatin (PRAVACHOL) 40 MG tablet Take 1 tablet (40 mg total) by mouth daily. 05/17/17   Jearld Fenton, NP  zonisamide (ZONEGRAN) 50 MG capsule Take 50 mg by mouth daily as needed (migraine).     [provider]    Family History Family History  Problem Relation Age of Onset  . Stroke Mother   . Heart disease Father   . Asthma Father   . Cancer Father        colon  . Cancer Sister        lung  . Cancer Brother        colon  . Breast cancer Other 48  . Diabetes Neg Hx     Social History Social History   Tobacco Use  . Smoking status: Never Smoker  . Smokeless tobacco: Never Used  Substance Use Topics  . Alcohol use: No    Alcohol/week: 0.0 oz  . Drug use: No     Allergies   No known allergies   Review of Systems Review of Systems  All other systems reviewed and are negative.    Physical Exam Updated Vital Signs BP (!) 184/114 (BP Location: Left Arm)   Pulse 84   Temp 98.4 F (36.9 C) (Oral)   Resp 20   SpO2 100%   Physical Exam  Constitutional: She is oriented to person, place, and time. She appears well-developed and well-nourished. No distress.  Speech is slow.  Nontoxic in appearance  HENT:  Head: Atraumatic.  Eyes: Pupils are equal, round, and reactive to light. Conjunctivae and EOM are normal.  Neck: Normal range of motion. Neck supple.  Cardiovascular: Normal rate and regular rhythm.  Pulmonary/Chest: Effort normal and breath sounds normal.  Abdominal: Soft. She exhibits no distension. There is no tenderness.  Neurological: She is alert and oriented to person, place, and time.  Neurologic exam:  Speech clear, pupils equal round reactive to light, extraocular movements intact  Normal peripheral visual fields Cranial nerves III through XII normal including no facial droop Follows  commands, moves all extremities x4, global decrease in strength to bilateral upper and lower extremities at all major muscle groups including grip Sensation normal to light touch  Coordination intact, no limb ataxia, finger-nose-finger normal Rapid alternating movements normal No pronator drift Gait not tested   Skin: No rash noted.  Psychiatric: She has a normal mood and affect.  Nursing note and vitals reviewed.    ED Treatments / Results  Labs (all labs ordered are listed, but only abnormal results are displayed) Labs Reviewed  I-STAT CHEM 8, ED - Abnormal; Notable for the following components:      Result Value  Glucose, Bld 132 (*)    All other components within normal limits  CBC WITH DIFFERENTIAL/PLATELET  URINALYSIS, ROUTINE W REFLEX MICROSCOPIC  I-STAT TROPONIN, ED    EKG None  ED ECG REPORT   Date: 05/31/2017  Rate: 121  Rhythm: sinus tachycardia  QRS Axis: left  Intervals: QT prolonged  ST/T Wave abnormalities: nonspecific ST/T changes  Conduction Disutrbances:right bundle branch block  Narrative Interpretation:   Old EKG Reviewed: unchanged  I have personally reviewed the EKG tracing and agree with the computerized printout as noted.   Radiology Dg Chest 2 View  Result Date: 05/31/2017 CLINICAL DATA:  Weakness and shortness of breath EXAM: CHEST - 2 VIEW COMPARISON:  03/28/2015 FINDINGS: There is a 2 cm rounded opacity projecting just posterior to the heart in the lateral view, not seen frontally. This is not seen previously. There is no edema, consolidation, effusion, or pneumothorax. Normal heart size and mediastinal contours. IMPRESSION: 1. 2 cm nodular density only seen on the lateral view, not noted in 2017. Recommend chest CT without contrast to exclude nodule. 2. No evidence of consolidation. Electronically Signed   By: Monte Fantasia M.D.   On: 05/31/2017 14:02   Ct Head Wo Contrast  Result Date: 05/31/2017 CLINICAL DATA:  Dizziness with  standing.  Headache and nausea EXAM: CT HEAD WITHOUT CONTRAST TECHNIQUE: Contiguous axial images were obtained from the base of the skull through the vertex without intravenous contrast. COMPARISON:  November 04, 2014 FINDINGS: Brain: The ventricles are normal in size and configuration. There is stable invagination of CSF into the sella. There is no intracranial mass, hemorrhage, extra-axial fluid collection, or midline shift. The gray-white compartments appear normal. No evident acute infarct. Vascular: There is no appreciable hyperdense vessel. There is calcification in each carotid siphon. Skull: Bony calvarium appears intact. Sinuses/Orbits: There is mucosal thickening in several ethmoid air cells. Other visualized paranasal sinuses are clear. Orbits appear symmetric bilaterally. Other: Mastoid air cells are clear. IMPRESSION: No intracranial mass or hemorrhage. Gray-white compartments appear normal. Stable mild invagination of CSF into the sella. Foci of arterial vascular calcification noted. Mucosal thickening noted in several ethmoid air cells. Electronically Signed   By: Lowella Grip III M.D.   On: 05/31/2017 12:34    Procedures Procedures (including critical care time)  Medications Ordered in ED Medications  LORazepam (ATIVAN) injection 1 mg (has no administration in time range)  sodium chloride 0.9 % bolus 1,000 mL (1,000 mLs Intravenous New Bag/Given 05/31/17 1212)  ondansetron (ZOFRAN) injection 4 mg (4 mg Intravenous Given 05/31/17 1212)  meclizine (ANTIVERT) tablet 25 mg (25 mg Oral Given 05/31/17 1213)     Initial Impression / Assessment and Plan / ED Course  I have reviewed the triage vital signs and the nursing notes.  Pertinent labs & imaging results that were available during my care of the patient were reviewed by me and considered in my medical decision making (see chart for details).     BP (!) 184/114 (BP Location: Left Arm)   Pulse 84   Temp 98.4 F (36.9 C) (Oral)    Resp 20   SpO2 100%    Final Clinical Impressions(s) / ED Diagnoses   Final diagnoses:  Vertigo    ED Discharge Orders    None     11:40 AM Patient here with complaints of dizziness along with nausea vomiting diarrhea.  She also endorsed some generalized body aches.  She does not have any other flulike symptoms.  No focal neuro  deficit on exam however given acute onset of dizziness and history of cancer, will obtain head CT scan.  Work-up initiated.  Patient found to be hypertensive with blood pressure 184/114.  Will check orthostatic vital sign.  3:22 PM Normal troponin, labs are reassuring, chest x-ray shows 2 cm nodules density cellulitis on the lateral view and radiologist recommended cc chest without contrast to exclude nodule.  Due to history of breast cancer, will obtain chest CT without contrast.  Treated with the head CT scan was obtained without acute finding.  We attempt to have patient ambulate but she is unable to ambulate due to her dizziness.  Will obtain a brain MRI to rule out posterior circulation stroke.  Care discussed with Dr. Venora Maples.  3:51 PM Pt sign out to DR. Ralene Bathe.  Pt awaits chest CT to assess for potential pulmonary nodule due to hx of breast cancer.  She will also need brain MRI. She is found to be hypertensive with BP 184/114.  Will give hydralazine 10mg .     Domenic Moras, PA-C 05/31/17 [place], MD 06/01/17 [phone]

## 2017-05-31 NOTE — ED Notes (Signed)
Patient transported to MRI 

## 2017-05-31 NOTE — ED Triage Notes (Signed)
Patient presents to ed via [facility] c/o generalized weakness  With nausea vomited x 1 at home and x 1 with EMS  States she was sitting at her computer and started getting getting nauseated. C/o left sided headache yest. C/o dizziness with standing.

## 2017-05-31 NOTE — ED Notes (Signed)
Pt aware of the need of a urine sample. Pt will notify staff when ready to provide sample.

## 2017-05-31 NOTE — ED Provider Notes (Signed)
Patient visit shared. Patient here for evaluation of dizziness/vertiginous type symptoms as well as vomiting. Her symptoms have improved following meclizine and Antivert in the department. MRI is negative for acute CVA. She did have recurrent episodes of emesis following eating a sandwich. She was feeling well until eating a sandwich. Presentation is not consistent with bowel obstruction. No evidence of dehydration. Plan to discharge home for vertigo, emesis. Will treat with meclizine and Zofran. Discussed close outpatient follow-up as well as return precautions.   Quintella Reichert, MD 06/01/17 Laureen Abrahams

## 2017-05-31 NOTE — ED Notes (Signed)
Attempted to sit patient up on the side of the bed unable to sit states she is to dizzy. Attempting to get urine  Unable to get urine at this time. Transported to xray.

## 2017-06-01 ENCOUNTER — Encounter: Payer: Self-pay | Admitting: Internal Medicine

## 2017-06-02 ENCOUNTER — Encounter: Payer: Self-pay | Admitting: Internal Medicine

## 2017-06-02 ENCOUNTER — Ambulatory Visit (INDEPENDENT_AMBULATORY_CARE_PROVIDER_SITE_OTHER): Payer: Medicare Other | Admitting: Internal Medicine

## 2017-06-02 VITALS — BP 146/90 | HR 73 | Temp 98.3°F

## 2017-06-02 DIAGNOSIS — R531 Weakness: Secondary | ICD-10-CM

## 2017-06-02 DIAGNOSIS — I10 Essential (primary) hypertension: Secondary | ICD-10-CM | POA: Diagnosis not present

## 2017-06-02 DIAGNOSIS — R42 Dizziness and giddiness: Secondary | ICD-10-CM | POA: Diagnosis not present

## 2017-06-02 MED ORDER — MECLIZINE HCL 25 MG PO TABS: 25.0000 mg | ORAL_TABLET | Freq: Three times a day (TID) | ORAL | 0 refills | Status: DC | PRN

## 2017-06-02 MED ORDER — ONDANSETRON 4 MG PO TBDP: 4.0000 mg | ORAL_TABLET | Freq: Three times a day (TID) | ORAL | 0 refills | Status: DC | PRN

## 2017-06-02 NOTE — Progress Notes (Signed)
Subjective:    Patient ID: Carrie Singh, female    DOB: 1955-10-29, 62 y.o.   MRN: [id number]  HPI  Patient presents to the clinic today for ER follow-up.  She presented to the ER on 5/15 with complaint of weakness and dizziness.  She did have some associated nausea, vomiting and loose stools.  She then suddenly developed left-sided headache, and left arm pain.  EKG, labs, CT head, chest x-ray, CT chest, and MRI of the brain were negative for acute findings.  She was treated with Ativan, meclizine for dizziness and hydralazine for hypertension.  She was discharged and advised to follow-up with her PCP.  Since discharge, she has noticed some improvement.  She will still get dizzy with position changes.  She denies nausea or vomiting.  She denies headache or weakness.  Her blood pressure remains elevated today at 146/90  Review of Systems      Past Medical History:  Diagnosis Date  . Breast cancer ([place])   . Breast cancer, right breast ([place]) 12/2007   a. s/p chemo, radiation, and lumpectomy with negative sentinel lymph node biopsy   . Depression   . Diabetes mellitus without complication ([place])   . GERD (gastroesophageal reflux disease)   . Hyperlipidemia   . Hypertension    resistant, negative renal Dopplers and negative CT angiogram  . Migraines   . Obesity   . Seizures ([place])    last seizure  1 week ago: states she jumps for a second and it's gone    Current Outpatient Medications  Medication Sig Dispense Refill  . anastrozole (ARIMIDEX) 1 MG tablet Take 1 tablet (1 mg total) by mouth daily. 90 tablet 0  . Aspirin-Acetaminophen-Caffeine (GOODY HEADACHE PO) Take 1 Package by mouth 3 (three) times daily as needed (migraine).    . citalopram (CELEXA) 20 MG tablet TAKE 1 TABLET (20 MG TOTAL) BY MOUTH DAILY. 30 tablet 6  . hydrALAZINE (APRESOLINE) 100 MG tablet Take 1 tablet (100 mg total) by mouth 3 (three) times daily. 270 tablet 3  . hydrochlorothiazide (HYDRODIURIL) 25 MG tablet  Take 1 tablet (25 mg total) by mouth daily. 90 tablet 1  . ibuprofen (ADVIL,MOTRIN) 600 MG tablet Take 1 tablet (600 mg total) by mouth every 6 (six) hours as needed for mild pain. 30 tablet 1  . irbesartan (AVAPRO) 300 MG tablet Take 1 tablet (300 mg total) by mouth daily. 90 tablet 2  . meclizine (ANTIVERT) 25 MG tablet Take 1 tablet (25 mg total) by mouth 3 (three) times daily as needed for dizziness. 30 tablet 0  . metFORMIN (GLUCOPHAGE) 1000 MG tablet Take 1 tablet (1,000 mg total) by mouth 2 (two) times daily with a meal. 180 tablet 0  . ondansetron (ZOFRAN ODT) 4 MG disintegrating tablet Take 1 tablet (4 mg total) by mouth every 8 (eight) hours as needed for nausea or vomiting. 30 tablet 0  . pravastatin (PRAVACHOL) 40 MG tablet Take 1 tablet (40 mg total) by mouth daily. 90 tablet 1  . pravastatin (PRAVACHOL) 40 MG tablet Take 1 tablet (40 mg total) by mouth daily. 90 tablet 1  . zonisamide (ZONEGRAN) 50 MG capsule Take 50 mg by mouth daily as needed (migraine).      No current facility-administered medications for this visit.     Allergies  Allergen Reactions  . No Known Allergies     Family History  Problem Relation Age of Onset  . Stroke Mother   . Heart disease Father   .  Asthma Father   . Cancer Father        colon  . Cancer Sister        lung  . Cancer Brother        colon  . Breast cancer Other 48  . Diabetes Neg Hx     Social History   Socioeconomic History  . Marital status: Married    Spouse name: [name]  . Number of children: Not on file  . Years of education: 12+  . Highest education level: Not on file  Occupational History  . Occupation: [employer]  Social Needs  . Financial resource strain: Not on file  . Food insecurity:    Worry: Not on file    Inability: Not on file  . Transportation needs:    Medical: Not on file    Non-medical: Not on file  Tobacco Use  . Smoking status: Never Smoker  . Smokeless tobacco: Never Used  Substance and Sexual  Activity  . Alcohol use: No    Alcohol/week: 0.0 oz  . Drug use: No  . Sexual activity: Yes  Lifestyle  . Physical activity:    Days per week: Not on file    Minutes per session: Not on file  . Stress: Not on file  Relationships  . Social connections:    Talks on phone: Not on file    Gets together: Not on file    Attends religious service: Not on file    Active member of club or organization: Not on file    Attends meetings of clubs or organizations: Not on file    Relationship status: Not on file  . Intimate partner violence:    Fear of current or ex partner: Not on file    Emotionally abused: Not on file    Physically abused: Not on file    Forced sexual activity: Not on file  Other Topics Concern  . Not on file  Social History Narrative   Lives at home with husband.   Caffeine use: 1-2 drinks per month         Epworth Sleepiness Scale = 15 (as of 01/01/15)     Constitutional: Pt reports fatigue. Denies fever, malaise, headache or abrupt weight changes.  Respiratory: Denies difficulty breathing, shortness of breath, cough or sputum production.   Cardiovascular: Denies chest pain, chest tightness, palpitations or swelling in the hands or feet.  Gastrointestinal: Denies abdominal pain, bloating, constipation, diarrhea or blood in the stool.  Musculoskeletal: Pt reports weakness that seems to be improving. Denies decrease in range of motion,  muscle pain or joint pain and swelling.  Neurological: Pt reports intermittent dizziness. Denies difficulty with memory, difficulty with speech or problems with balance and coordination.    No other specific complaints in a complete review of systems (except as listed in HPI above).  Objective:   Physical Exam  BP (!) 146/90   Pulse 73   Temp 98.3 F (36.8 C) (Oral)   SpO2 99%  Wt Readings from Last 3 Encounters:  05/15/17 211 lb (95.7 kg)  02/01/17 212 lb (96.2 kg)  12/23/16 218 lb 6.4 oz (99.1 kg)    General: Appears  her stated age, well developed, well nourished in NAD. Cardiovascular: Normal rate and rhythm. S1,S2 noted.  No murmur, rubs or gallops noted. Pulmonary/Chest: Normal effort and positive vesicular breath sounds. No respiratory distress. No wheezes, rales or ronchi noted.  Abdomen: Soft and nontender. Normal bowel sounds.  Musculoskeletal: Gait slow but  steady. Neurological: Alert and oriented. Coordination normal.    BMET    Component Value Date/Time   NA 141 05/31/2017 1155   NA 146 (H) 01/20/2015 1457   NA 143 10/12/2013 1510   K 3.5 05/31/2017 1155   K 3.9 10/12/2013 1510   CL 102 05/31/2017 1155   CL 109 (H) 10/12/2013 1510   CO2 29 09/29/2016 1246   CO2 29 10/12/2013 1510   GLUCOSE 132 (H) 05/31/2017 1155   GLUCOSE 92 10/12/2013 1510   BUN 16 05/31/2017 1155   BUN 17 01/20/2015 1457   BUN 16 10/12/2013 1510   CREATININE 1.00 05/31/2017 1155   CREATININE 1.15 (H) 10/12/2015 1603   CALCIUM 9.7 09/29/2016 1246   CALCIUM 9.4 10/12/2013 1510   GFRNONAA 46 (L) 03/15/2016 0915   GFRNONAA 56 (L) 10/12/2013 1510   GFRAA 53 (L) 03/15/2016 0915   GFRAA >60 10/12/2013 1510    Lipid Panel     Component Value Date/Time   CHOL 150 09/29/2016 1246   CHOL 179 10/17/2014 1527   TRIG 105.0 09/29/2016 1246   HDL 63.60 09/29/2016 1246   HDL 59 10/17/2014 1527   CHOLHDL 2 09/29/2016 1246   VLDL 21.0 09/29/2016 1246   LDLCALC 65 09/29/2016 1246   LDLCALC 91 10/17/2014 1527    CBC    Component Value Date/Time   WBC 5.3 05/31/2017 1145   RBC 4.38 05/31/2017 1145   HGB 12.2 05/31/2017 1155   HGB 13.5 10/12/2013 1510   HGB 12.8 08/13/2009 0946   HCT 36.0 05/31/2017 1155   HCT 40.5 10/12/2013 1510   HCT 36.8 08/13/2009 0946   PLT 268 05/31/2017 1145   PLT 278 10/12/2013 1510   PLT 271 08/13/2009 0946   MCV 87.2 05/31/2017 1145   MCV 87 10/12/2013 1510   MCV 87.0 08/13/2009 0946   MCH 29.0 05/31/2017 1145   MCHC 33.2 05/31/2017 1145   RDW 12.7 05/31/2017 1145   RDW 13.6  10/12/2013 1510   RDW 13.8 08/13/2009 0946   LYMPHSABS 1.1 05/31/2017 1145   LYMPHSABS 1.5 08/13/2009 0946   MONOABS 0.3 05/31/2017 1145   MONOABS 0.2 08/13/2009 0946   EOSABS 0.0 05/31/2017 1145   EOSABS 0.1 08/13/2009 0946   BASOSABS 0.0 05/31/2017 1145   BASOSABS 0.0 08/13/2009 0946    Hgb A1C Lab Results  Component Value Date   HGBA1C 6.8 (H) 09/29/2016            Assessment & Plan:   ER follow up for Weakness, Dizziness and HTN:  ER notes, labs and imaging reviewed Encouraged adequate water intake She declines referral for PT today Will refill Meclizine and Zofran today BP improved, still not at goal. Hesitant to add anything to her regimen at this time due to dizziness and mild weakness  Return/ER precautions discussed Webb Silversmith, NP

## 2017-06-02 NOTE — Patient Instructions (Signed)

## 2017-06-11 ENCOUNTER — Encounter: Payer: Self-pay | Admitting: Internal Medicine

## 2017-06-23 ENCOUNTER — Telehealth: Payer: Self-pay | Admitting: Hematology and Oncology

## 2017-06-23 ENCOUNTER — Inpatient Hospital Stay: Payer: Medicare Other | Attending: Hematology and Oncology | Admitting: Hematology and Oncology

## 2017-06-23 VITALS — BP 149/81 | HR 95 | Temp 98.5°F | Resp 18 | Ht 65.25 in | Wt 216.5 lb

## 2017-06-23 DIAGNOSIS — Z780 Asymptomatic menopausal state: Secondary | ICD-10-CM | POA: Diagnosis not present

## 2017-06-23 DIAGNOSIS — Z7982 Long term (current) use of aspirin: Secondary | ICD-10-CM | POA: Diagnosis not present

## 2017-06-23 DIAGNOSIS — E119 Type 2 diabetes mellitus without complications: Secondary | ICD-10-CM | POA: Diagnosis not present

## 2017-06-23 DIAGNOSIS — Z79899 Other long term (current) drug therapy: Secondary | ICD-10-CM | POA: Diagnosis not present

## 2017-06-23 DIAGNOSIS — Z170 Estrogen receptor positive status [ER+]: Secondary | ICD-10-CM

## 2017-06-23 DIAGNOSIS — C50511 Malignant neoplasm of lower-outer quadrant of right female breast: Secondary | ICD-10-CM | POA: Insufficient documentation

## 2017-06-23 DIAGNOSIS — Z7984 Long term (current) use of oral hypoglycemic drugs: Secondary | ICD-10-CM | POA: Diagnosis not present

## 2017-06-23 DIAGNOSIS — Z79811 Long term (current) use of aromatase inhibitors: Secondary | ICD-10-CM | POA: Diagnosis not present

## 2017-06-23 MED ORDER — ANASTROZOLE 1 MG PO TABS: 1.0000 mg | ORAL_TABLET | Freq: Every day | ORAL | 3 refills | Status: DC

## 2017-06-23 NOTE — Telephone Encounter (Signed)
Per 6/7 Patient declined avs and calendar

## 2017-06-23 NOTE — Assessment & Plan Note (Signed)
Right simple mastectomy: Grade 2 IDC with DCIS 1.5 cm, margins -0/3 lymph nodes, T1b N0 stage IA, ER 100%, PR 90%, Ki-67 10%, HER-2 negative ratio 1.28 Oncotype DX score 11:7% risk of recurrence  Current treatment: Letrozole 2.5 mg daily started 04/2016 switched to exemestane 05/26/2016  Breast Cancer Surveillance: 1. Breast exam  06/23/2017: Normal 2. Mammogram and bone density January 2019 CT chest and MRI brain 05/31/2017: Negative for metastatic disease  Exemestane toxicities: Muscle and joint pains  Return to clinic in 1 year for follow-up

## 2017-06-23 NOTE — Progress Notes (Signed)
 Patient Care Team: Baity, Regina W, NP as PCP - General (Internal Medicine) Gollan, Timothy J, MD as Consulting Physician (Cardiology) Gudena, Vinay, MD as Consulting Physician (Hematology and Oncology) Newman, David, MD as Consulting Physician (General Surgery) Causey, Lindsey Cornetto, NP as Nurse Practitioner (Hematology and Oncology)  DIAGNOSIS:  Encounter Diagnoses  Name Primary?  . Malignant neoplasm of lower-outer quadrant of right breast of female, estrogen receptor positive (HCC)   . Post-menopausal Yes    SUMMARY OF ONCOLOGIC HISTORY:   Malignant neoplasm of lower-outer quadrant of right breast of female, estrogen receptor positive (HCC)   12/18/2007 Initial Diagnosis    Right breast cancer treated with lumpectomy followed by radiation and 5 years of antiestrogen therapy with aromatase inhibitor      02/02/2016 Relapse/Recurrence    Right breast biopsy 6:00 position: IDC with DCIS grade 2, ER 100%, PR 90%, Ki-67 10%, HER-2 negative ratio 1.28, screening detected right breast lumps 0.5 cm and 0.3 cm, T1a N0 stage IA clinical stage      03/18/2016 Surgery    Right simple mastectomy: Grade 2 IDC with DCIS 1.5 cm, margins -0/3 lymph nodes, T1b N0 stage IA, ER 100%, PR 90%, Ki-67 10%, HER-2 negative ratio 1.28      03/18/2016 Oncotype testing    11/7%      04/2016 -  Anti-estrogen oral therapy    Letrozole 2.5mg daily switch to exemestane 25 mg daily May 2018 switched to anastrozole 1 mg daily due to cost       CHIEF COMPLIANT: Follow-up on anastrozole therapy  INTERVAL HISTORY: Carrie Singh is a 62-year-old with above-mentioned history of right breast cancer treated with mastectomy followed by antiestrogen therapy.  She is currently on anastrozole.  She was having lots of muscle and joint pains from exemestane.  She had profound vertigo for over a week and underwent extensive evaluation.  She underwent a CT of the head and a chest CT scan and a brain MRI recently for  complaints of dizziness as well as shortness of breath and they did not show any evidence of metastatic disease.  REVIEW OF SYSTEMS:   Constitutional: Denies fevers, chills or abnormal weight loss Eyes: Denies blurriness of vision Ears, nose, mouth, throat, and face: Denies mucositis or sore throat Respiratory: Denies cough, dyspnea or wheezes Cardiovascular: Denies palpitation, chest discomfort Gastrointestinal:  Denies nausea, heartburn or change in bowel habits Skin: Denies abnormal skin rashes Lymphatics: Denies new lymphadenopathy or easy bruising Neurological:Denies numbness, tingling or new weaknesses Behavioral/Psych: Mood is stable, no new changes  Extremities: No lower extremity edema Breast: Right mastectomy All other systems were reviewed with the patient and are negative.  I have reviewed the past medical history, past surgical history, social history and family history with the patient and they are unchanged from previous note.  ALLERGIES:  is allergic to no known allergies.  MEDICATIONS:  Current Outpatient Medications  Medication Sig Dispense Refill  . anastrozole (ARIMIDEX) 1 MG tablet Take 1 tablet (1 mg total) by mouth daily. 90 tablet 0  . Aspirin-Acetaminophen-Caffeine (GOODY HEADACHE PO) Take 1 Package by mouth 3 (three) times daily as needed (migraine).    . citalopram (CELEXA) 20 MG tablet TAKE 1 TABLET (20 MG TOTAL) BY MOUTH DAILY. 30 tablet 6  . hydrALAZINE (APRESOLINE) 100 MG tablet Take 1 tablet (100 mg total) by mouth 3 (three) times daily. 270 tablet 3  . hydrochlorothiazide (HYDRODIURIL) 25 MG tablet Take 1 tablet (25 mg total) by mouth daily. 90   tablet 1  . ibuprofen (ADVIL,MOTRIN) 600 MG tablet Take 1 tablet (600 mg total) by mouth every 6 (six) hours as needed for mild pain. 30 tablet 1  . irbesartan (AVAPRO) 300 MG tablet Take 1 tablet (300 mg total) by mouth daily. 90 tablet 2  . meclizine (ANTIVERT) 25 MG tablet Take 1 tablet (25 mg total) by mouth  3 (three) times daily as needed for dizziness. 30 tablet 0  . metFORMIN (GLUCOPHAGE) 1000 MG tablet Take 1 tablet (1,000 mg total) by mouth 2 (two) times daily with a meal. 180 tablet 0  . ondansetron (ZOFRAN ODT) 4 MG disintegrating tablet Take 1 tablet (4 mg total) by mouth every 8 (eight) hours as needed for nausea or vomiting. 30 tablet 0  . pravastatin (PRAVACHOL) 40 MG tablet Take 1 tablet (40 mg total) by mouth daily. 90 tablet 1  . pravastatin (PRAVACHOL) 40 MG tablet Take 1 tablet (40 mg total) by mouth daily. 90 tablet 1  . zonisamide (ZONEGRAN) 50 MG capsule Take 50 mg by mouth daily as needed (migraine).      No current facility-administered medications for this visit.     PHYSICAL EXAMINATION: ECOG PERFORMANCE STATUS: 1 - Symptomatic but completely ambulatory  Vitals:   06/23/17 0841  BP: (!) 149/81  Pulse: 95  Resp: 18  Temp: 98.5 F (36.9 C)  SpO2: 99%   Filed Weights   06/23/17 0841  Weight: 216 lb 8 oz (98.2 kg)    GENERAL:alert, no distress and comfortable SKIN: skin color, texture, turgor are normal, no rashes or significant lesions EYES: normal, Conjunctiva are pink and non-injected, sclera clear OROPHARYNX:no exudate, no erythema and lips, buccal mucosa, and tongue normal  NECK: supple, thyroid normal size, non-tender, without nodularity LYMPH:  no palpable lymphadenopathy in the cervical, axillary or inguinal LUNGS: clear to auscultation and percussion with normal breathing effort HEART: regular rate & rhythm and no murmurs and no lower extremity edema ABDOMEN:abdomen soft, non-tender and normal bowel sounds MUSCULOSKELETAL:no cyanosis of digits and no clubbing  NEURO: alert & oriented x 3 with fluent speech, no focal motor/sensory deficits EXTREMITIES: No lower extremity edema  LABORATORY DATA:  I have reviewed the data as listed CMP Latest Ref Rng & Units 05/31/2017 09/29/2016 03/15/2016  Glucose 65 - 99 mg/dL 132(H) 109(H) 176(H)  BUN 6 - 20 mg/dL 16  20 18  Creatinine 0.44 - 1.00 mg/dL 1.00 1.12 1.24(H)  Sodium 135 - 145 mmol/L 141 138 139  Potassium 3.5 - 5.1 mmol/L 3.5 4.0 3.5  Chloride 101 - 111 mmol/L 102 102 101  CO2 19 - 32 mEq/L - 29 27  Calcium 8.4 - 10.5 mg/dL - 9.7 9.8  Total Protein 6.0 - 8.3 g/dL - 7.1 -  Total Bilirubin 0.2 - 1.2 mg/dL - 1.0 -  Alkaline Phos 39 - 117 U/L - 57 -  AST 0 - 37 U/L - 28 -  ALT 0 - 35 U/L - 22 -    Lab Results  Component Value Date   WBC 5.3 05/31/2017   HGB 12.2 05/31/2017   HCT 36.0 05/31/2017   MCV 87.2 05/31/2017   PLT 268 05/31/2017   NEUTROABS 3.8 05/31/2017    ASSESSMENT & PLAN:  Malignant neoplasm of lower-outer quadrant of right breast of female, estrogen receptor positive (HCC) Right simple mastectomy: Grade 2 IDC with DCIS 1.5 cm, margins -0/3 lymph nodes, T1b N0 stage IA, ER 100%, PR 90%, Ki-67 10%, HER-2 negative ratio 1.28 Oncotype DX score   11:7% risk of recurrence  Current treatment: Letrozole 2.5 mg daily started 04/2016 switched to exemestane 05/26/2016 switched to anastrozole  Breast Cancer Surveillance: 1. Breast exam  06/23/2017: No palpable lumps or nodules of concern 2. Mammogram and bone density  have not been performed.  We will order them to be done within the next week at the breast center. CT chest and MRI brain 05/31/2017: Negative for metastatic disease  Anastrozole toxicities: Muscle and joint pains are tolerable  Return to clinic in 1 year for follow-up    Orders Placed This Encounter  Procedures  . DG Bone Density    Standing Status:   Future    Standing Expiration Date:   07/28/2018    Order Specific Question:   Reason for exam:    Answer:   Post menopausal on aromatase inh therapy    Order Specific Question:   Preferred imaging location?    Answer:   [facility]   The patient has a good understanding of the overall plan. she agrees with it. she will call with any problems that may develop before the next visit here.   Harriette Ohara,  MD 06/23/17

## 2017-07-12 ENCOUNTER — Other Ambulatory Visit: Payer: Self-pay | Admitting: Hematology and Oncology

## 2017-07-12 ENCOUNTER — Other Ambulatory Visit: Payer: Self-pay | Admitting: Adult Health

## 2017-07-12 DIAGNOSIS — Z1239 Encounter for other screening for malignant neoplasm of breast: Secondary | ICD-10-CM

## 2017-07-12 DIAGNOSIS — Z780 Asymptomatic menopausal state: Secondary | ICD-10-CM

## 2017-07-12 DIAGNOSIS — E2839 Other primary ovarian failure: Secondary | ICD-10-CM

## 2017-07-28 ENCOUNTER — Telehealth: Payer: Self-pay | Admitting: Cardiovascular Disease

## 2017-07-28 NOTE — Telephone Encounter (Signed)
LMTCB  Per chart review, patient was last seen 05/2016 and advised to f/up as needed

## 2017-07-28 NOTE — Telephone Encounter (Signed)
Patient returned call. She is requesting refill of chlorthalidone. Explained this is not on our med list. She will need to contact prescribing provider.

## 2017-07-28 NOTE — Telephone Encounter (Signed)
New Message:      Pt is calling and wanting a refill on a medication that is not on her medication list.

## 2017-08-23 ENCOUNTER — Ambulatory Visit
Admission: RE | Admit: 2017-08-23 | Discharge: 2017-08-23 | Disposition: A | Discharge status: Home / Self Care | Payer: Medicare Other | Source: Ambulatory Visit | Attending: Hematology and Oncology | Admitting: Hematology and Oncology

## 2017-08-23 ENCOUNTER — Ambulatory Visit
Admission: RE | Admit: 2017-08-23 | Discharge: 2017-08-23 | Disposition: A | Discharge status: Home / Self Care | Payer: Medicare Other | Source: Ambulatory Visit | Attending: Adult Health | Admitting: Adult Health

## 2017-08-23 DIAGNOSIS — E2839 Other primary ovarian failure: Secondary | ICD-10-CM

## 2017-08-23 DIAGNOSIS — Z1239 Encounter for other screening for malignant neoplasm of breast: Secondary | ICD-10-CM

## 2017-08-23 DIAGNOSIS — Z780 Asymptomatic menopausal state: Secondary | ICD-10-CM

## 2017-10-02 ENCOUNTER — Other Ambulatory Visit: Payer: Self-pay | Admitting: Internal Medicine

## 2017-10-06 ENCOUNTER — Encounter: Payer: Self-pay | Admitting: Internal Medicine

## 2017-10-06 NOTE — Telephone Encounter (Signed)
She is due for her CPE now. Do you need her to have extra time to address her issue?

## 2017-11-17 LAB — HM DIABETES EYE EXAM

## 2017-11-21 ENCOUNTER — Encounter: Payer: Self-pay | Admitting: Internal Medicine

## 2017-11-21 LAB — HM DIABETES EYE EXAM

## 2017-11-27 ENCOUNTER — Ambulatory Visit (INDEPENDENT_AMBULATORY_CARE_PROVIDER_SITE_OTHER): Payer: Medicare Other | Admitting: Internal Medicine

## 2017-11-27 ENCOUNTER — Encounter: Payer: Self-pay | Admitting: Internal Medicine

## 2017-11-27 VITALS — BP 138/86 | HR 102 | Temp 98.4°F | Ht 62.5 in | Wt 219.0 lb

## 2017-11-27 DIAGNOSIS — M79602 Pain in left arm: Secondary | ICD-10-CM | POA: Diagnosis not present

## 2017-11-27 DIAGNOSIS — K219 Gastro-esophageal reflux disease without esophagitis: Secondary | ICD-10-CM

## 2017-11-27 DIAGNOSIS — E7800 Pure hypercholesterolemia, unspecified: Secondary | ICD-10-CM

## 2017-11-27 DIAGNOSIS — Z0000 Encounter for general adult medical examination without abnormal findings: Secondary | ICD-10-CM

## 2017-11-27 DIAGNOSIS — F329 Major depressive disorder, single episode, unspecified: Secondary | ICD-10-CM

## 2017-11-27 DIAGNOSIS — I10 Essential (primary) hypertension: Secondary | ICD-10-CM

## 2017-11-27 DIAGNOSIS — C50511 Malignant neoplasm of lower-outer quadrant of right female breast: Secondary | ICD-10-CM

## 2017-11-27 DIAGNOSIS — G44011 Episodic cluster headache, intractable: Secondary | ICD-10-CM

## 2017-11-27 DIAGNOSIS — R202 Paresthesia of skin: Secondary | ICD-10-CM

## 2017-11-27 DIAGNOSIS — E119 Type 2 diabetes mellitus without complications: Secondary | ICD-10-CM

## 2017-11-27 DIAGNOSIS — Z170 Estrogen receptor positive status [ER+]: Secondary | ICD-10-CM

## 2017-11-27 DIAGNOSIS — F450 Somatization disorder: Secondary | ICD-10-CM

## 2017-11-27 DIAGNOSIS — M79601 Pain in right arm: Secondary | ICD-10-CM | POA: Diagnosis not present

## 2017-11-27 NOTE — Progress Notes (Signed)
HPI:  Pt presents to the clinic today for her Medicare Wellness Exam.  Hx of Breast Can: s/p chemo, radiation and mastectomy. She is taking Arimadex as prescribed. She follows with Dr. Lindi Adie.  Depression: Triggered by chronic health issues. She is not taking any antidepressant medication at this time. She denies anxiety, SI/HI.  DM 2: Her last A1C was 6.8%, 09/2016. She is not monitoring her sugars. She is taking Metformin as prescribed. She checks her feet routinely. Her last eye exam was within the last year.  GERD:Intermittent. She is not sure what is triggering this. She take Tums or Rolaids OTC. There is no upper GI on file.  HLD: Her last LDL was 65, 09/2016. She denies myalgias on Pravastatin. She does eat some fried foods.   HTN: Her BP today is 138/86. She is taking Irbesartan and Hydralazine as prescribed. ECG from 05/2017 reviewed.  Migraines: She reports she has frequent headaches but not a migraine. She takes [employer] as needed with good relief.  Seizures: Questionable diagnosis. No seizure off seizure medications. She no longer follows with neurology.  Past Medical History:  Diagnosis Date  . Breast cancer ([place])   . Breast cancer, right breast ([place]) 12/2007   a. s/p chemo, radiation, and lumpectomy with negative sentinel lymph node biopsy   . Depression   . Diabetes mellitus without complication ([place])   . GERD (gastroesophageal reflux disease)   . Hyperlipidemia   . Hypertension    resistant, negative renal Dopplers and negative CT angiogram  . Migraines   . Obesity   . Seizures ([place])    last seizure  1 week ago: states she jumps for a second and it's gone    Current Outpatient Medications  Medication Sig Dispense Refill  . anastrozole (ARIMIDEX) 1 MG tablet Take 1 tablet (1 mg total) by mouth daily. 90 tablet 3  . Aspirin-Acetaminophen-Caffeine (GOODY HEADACHE PO) Take 1 Package by mouth 3 (three) times daily as needed (migraine).    . Biotin w/ Vitamins C &  E (HAIR SKIN & NAILS GUMMIES PO) Take by mouth.    . calcium carbonate (OS-CAL) 1250 (500 Ca) MG chewable tablet Chew 1 tablet by mouth daily.    . hydrALAZINE (APRESOLINE) 100 MG tablet Take 1 tablet (100 mg total) by mouth 3 (three) times daily. 270 tablet 3  . irbesartan (AVAPRO) 300 MG tablet Take 1 tablet (300 mg total) by mouth daily. 90 tablet 2  . metFORMIN (GLUCOPHAGE) 1000 MG tablet Take 1 tablet (1,000 mg total) by mouth 2 (two) times daily with a meal. MUST SCHEDULE ANNUAL EXAM 180 tablet 0  . Multiple Vitamins-Minerals (ONE-A-DAY WOMENS VITACRAVES) CHEW Chew by mouth.    . pravastatin (PRAVACHOL) 40 MG tablet Take 1 tablet (40 mg total) by mouth daily. 90 tablet 1   No current facility-administered medications for this visit.     Allergies  Allergen Reactions  . No Known Allergies     Family History  Problem Relation Age of Onset  . Stroke Mother   . Heart disease Father   . Asthma Father   . Cancer Father        colon  . Cancer Sister        lung  . Cancer Brother        colon  . Breast cancer Other 48  . Diabetes Neg Hx     Social History   Socioeconomic History  . Marital status: Married    Spouse name: [name]  .  Number of children: Not on file  . Years of education: 12+  . Highest education level: Not on file  Occupational History  . Occupation: [employer]  Social Needs  . Financial resource strain: Not on file  . Food insecurity:    Worry: Not on file    Inability: Not on file  . Transportation needs:    Medical: Not on file    Non-medical: Not on file  Tobacco Use  . Smoking status: Never Smoker  . Smokeless tobacco: Never Used  Substance and Sexual Activity  . Alcohol use: No    Alcohol/week: 0.0 standard drinks  . Drug use: No  . Sexual activity: Yes  Lifestyle  . Physical activity:    Days per week: Not on file    Minutes per session: Not on file  . Stress: Not on file  Relationships  . Social connections:    Talks on phone: Not on file     Gets together: Not on file    Attends religious service: Not on file    Active member of club or organization: Not on file    Attends meetings of clubs or organizations: Not on file    Relationship status: Not on file  . Intimate partner violence:    Fear of current or ex partner: Not on file    Emotionally abused: Not on file    Physically abused: Not on file    Forced sexual activity: Not on file  Other Topics Concern  . Not on file  Social History Narrative   Lives at home with husband.   Caffeine use: 1-2 drinks per month         Epworth Sleepiness Scale = 15 (as of 01/01/15)    Hospitiliaztions: None  Health Maintenance:    Flu: never  Tetanus: 12/2008  Pneumovax: never  Shingrix: never  Mammogram: 08/2017  Pap Smear: hysterectomy  Bone Density: 08/2017  Colon Screening: 01/2011  Eye Doctor: 11/2017, [facility]  Dental Exam: as needed   Providers:   PCP: Webb Silversmith, NP-C  Oncologist: Dr. Lindi Adie  Cardiologist: Dr. Gwenlyn Found     I have personally reviewed and have noted:  1. The patient's medical and social history 2. Their use of alcohol, tobacco or illicit drugs 3. Their current medications and supplements 4. The patient's functional ability including ADL's, fall risks, home safety risks and hearing or visual impairment. 5. Diet and physical activities 6. Evidence for depression or mood disorder  Subjective:   Review of Systems:   Constitutional: Pt reports intermittent headaches. Denies fever, malaise, fatigue,  or abrupt weight changes.  HEENT: Denies eye pain, eye redness, ear pain, ringing in the ears, wax buildup, runny nose, nasal congestion, bloody nose, or sore throat. Respiratory: Denies difficulty breathing, shortness of breath, cough or sputum production.   Cardiovascular: Denies chest pain, chest tightness, palpitations or swelling in the hands or feet.  Gastrointestinal: Pt reports intermittent reflux. Denies abdominal pain, bloating,  constipation, diarrhea or blood in the stool.  GU: Denies urgency, frequency, pain with urination, burning sensation, blood in urine, odor or discharge. Musculoskeletal: Denies decrease in range of motion, difficulty with gait, muscle pain or joint pain and swelling.  Skin: Denies redness, rashes, lesions or ulcercations.  Neurological: Pt reports tingling of BUE. Denies dizziness, difficulty with memory, difficulty with speech or problems with balance and coordination.  Psych: Pt reports history of depression. Denies anxiety, SI/HI.  No other specific complaints in a complete review of systems (except  as listed in HPI above).  Objective:  PE:   BP 138/86   Pulse (!) 102   Temp 98.4 F (36.9 C) (Oral)   Ht 5' 2.5" (1.588 m)   Wt 219 lb (99.3 kg)   SpO2 98%   BMI 39.42 kg/m   Wt Readings from Last 3 Encounters:  11/27/17 219 lb (99.3 kg)  06/23/17 216 lb 8 oz (98.2 kg)  05/15/17 211 lb (95.7 kg)    General: Appears her stated age, obese, in NAD. Skin: Warm, dry and intact. No ulcerations noted. HEENT: Head: normal shape and size; Eyes: sclera white, no icterus, conjunctiva pink, PERRLA and EOMs intact; Ears: Tm's gray and intact, normal light reflex; Throat/Mouth: Teeth present, mucosa pink and moist, no exudate, lesions or ulcerations noted.  Neck: Neck supple, trachea midline. No masses, lumps or thyromegaly present.  Cardiovascular: Normal rate and rhythm. S1,S2 noted.  No murmur, rubs or gallops noted. No JVD or BLE edema. No carotid bruits noted. Pulmonary/Chest: Normal effort and positive vesicular breath sounds. No respiratory distress. No wheezes, rales or ronchi noted.  Abdomen: Soft and nontender. Normal bowel sounds. No distention or masses noted. Liver, spleen and kidneys non palpable. Musculoskeletal: Strength 5/5 BUE/BLE. No difficulty with gait.  Neurological: Alert and oriented. Cranial nerves II-XII grossly intact. Coordination normal.  Psychiatric: Mood and  affect normal. Behavior is normal. Judgment and thought content normal.     BMET    Component Value Date/Time   NA 141 05/31/2017 1155   NA 146 (H) 01/20/2015 1457   NA 143 10/12/2013 1510   K 3.5 05/31/2017 1155   K 3.9 10/12/2013 1510   CL 102 05/31/2017 1155   CL 109 (H) 10/12/2013 1510   CO2 29 09/29/2016 1246   CO2 29 10/12/2013 1510   GLUCOSE 132 (H) 05/31/2017 1155   GLUCOSE 92 10/12/2013 1510   BUN 16 05/31/2017 1155   BUN 17 01/20/2015 1457   BUN 16 10/12/2013 1510   CREATININE 1.00 05/31/2017 1155   CREATININE 1.15 (H) 10/12/2015 1603   CALCIUM 9.7 09/29/2016 1246   CALCIUM 9.4 10/12/2013 1510   GFRNONAA 46 (L) 03/15/2016 0915   GFRNONAA 56 (L) 10/12/2013 1510   GFRAA 53 (L) 03/15/2016 0915   GFRAA >60 10/12/2013 1510    Lipid Panel     Component Value Date/Time   CHOL 150 09/29/2016 1246   CHOL 179 10/17/2014 1527   TRIG 105.0 09/29/2016 1246   HDL 63.60 09/29/2016 1246   HDL 59 10/17/2014 1527   CHOLHDL 2 09/29/2016 1246   VLDL 21.0 09/29/2016 1246   LDLCALC 65 09/29/2016 1246   LDLCALC 91 10/17/2014 1527    CBC    Component Value Date/Time   WBC 5.3 05/31/2017 1145   RBC 4.38 05/31/2017 1145   HGB 12.2 05/31/2017 1155   HGB 13.5 10/12/2013 1510   HGB 12.8 08/13/2009 0946   HCT 36.0 05/31/2017 1155   HCT 40.5 10/12/2013 1510   HCT 36.8 08/13/2009 0946   PLT 268 05/31/2017 1145   PLT 278 10/12/2013 1510   PLT 271 08/13/2009 0946   MCV 87.2 05/31/2017 1145   MCV 87 10/12/2013 1510   MCV 87.0 08/13/2009 0946   MCH 29.0 05/31/2017 1145   MCHC 33.2 05/31/2017 1145   RDW 12.7 05/31/2017 1145   RDW 13.6 10/12/2013 1510   RDW 13.8 08/13/2009 0946   LYMPHSABS 1.1 05/31/2017 1145   LYMPHSABS 1.5 08/13/2009 0946   MONOABS 0.3 05/31/2017 1145  MONOABS 0.2 08/13/2009 0946   EOSABS 0.0 05/31/2017 1145   EOSABS 0.1 08/13/2009 0946   BASOSABS 0.0 05/31/2017 1145   BASOSABS 0.0 08/13/2009 0946    Hgb A1C Lab Results  Component Value Date    HGBA1C 6.8 (H) 09/29/2016      Assessment and Plan:   Medicare Annual Wellness Visit:  Diet: She does eat meat. She consumes fruits and veggies daily. She does eat some fried foods. She drinks mostly coffee, water. Physical activity: None Depression/mood screen: Chronic but stable off meds. Hearing: Intact to whispered voice Visual acuity: Grossly normal, performs annual eye exam  ADLs: Capable Fall risk: None Home safety: Good Cognitive evaluation: Intact to orientation, naming, recall and repetition EOL planning: No adv directives, full code/ I agree  Preventative Medicine: She declines flu, pneumovax or shingrix. Tetanus UTD. She no longer needs pap smears. Mammogram, bone density and colon screening UTD. Encouraged her to consume a balanced diet and exercise regimen. Advised her to see an eye doctor and dentist annually. Will check CBC, CMET, Lipid, A1C, Vit D and B12.  Paresthesia of BUE:  Vit D and B12 today  Next appointment: 1 year, Medicare Wellness Exam   Webb Silversmith, NP

## 2017-11-27 NOTE — Patient Instructions (Signed)
Health Maintenance for Postmenopausal Women Menopause is a normal process in which your reproductive ability comes to an end. This process happens gradually over a span of months to years, usually between the ages of 22 and 9. Menopause is complete when you have missed 12 consecutive menstrual periods. It is important to talk with your health care provider about some of the most common conditions that affect postmenopausal women, such as heart disease, cancer, and bone loss (osteoporosis). Adopting a healthy lifestyle and getting preventive care can help to promote your health and wellness. Those actions can also lower your chances of developing some of these common conditions. What should I know about menopause? During menopause, you may experience a number of symptoms, such as:  Moderate-to-severe hot flashes.  Night sweats.  Decrease in sex drive.  Mood swings.  Headaches.  Tiredness.  Irritability.  Memory problems.  Insomnia.  Choosing to treat or not to treat menopausal changes is an individual decision that you make with your health care provider. What should I know about hormone replacement therapy and supplements? Hormone therapy products are effective for treating symptoms that are associated with menopause, such as hot flashes and night sweats. Hormone replacement carries certain risks, especially as you become older. If you are thinking about using estrogen or estrogen with progestin treatments, discuss the benefits and risks with your health care provider. What should I know about heart disease and stroke? Heart disease, heart attack, and stroke become more likely as you age. This may be due, in part, to the hormonal changes that your body experiences during menopause. These can affect how your body processes dietary fats, triglycerides, and cholesterol. Heart attack and stroke are both medical emergencies. There are many things that you can do to help prevent heart disease  and stroke:  Have your blood pressure checked at least every 1-2 years. High blood pressure causes heart disease and increases the risk of stroke.  If you are 53-22 years old, ask your health care provider if you should take aspirin to prevent a heart attack or a stroke.  Do not use any tobacco products, including cigarettes, chewing tobacco, or electronic cigarettes. If you need help quitting, ask your health care provider.  It is important to eat a healthy diet and maintain a healthy weight. ? Be sure to include plenty of vegetables, fruits, low-fat dairy products, and lean protein. ? Avoid eating foods that are high in solid fats, added sugars, or salt (sodium).  Get regular exercise. This is one of the most important things that you can do for your health. ? Try to exercise for at least 150 minutes each week. The type of exercise that you do should increase your heart rate and make you sweat. This is known as moderate-intensity exercise. ? Try to do strengthening exercises at least twice each week. Do these in addition to the moderate-intensity exercise.  Know your numbers.Ask your health care provider to check your cholesterol and your blood glucose. Continue to have your blood tested as directed by your health care provider.  What should I know about cancer screening? There are several types of cancer. Take the following steps to reduce your risk and to catch any cancer development as early as possible. Breast Cancer  Practice breast self-awareness. ? This means understanding how your breasts normally appear and feel. ? It also means doing regular breast self-exams. Let your health care provider know about any changes, no matter how small.  If you are 40  or older, have a clinician do a breast exam (clinical breast exam or CBE) every year. Depending on your age, family history, and medical history, it may be recommended that you also have a yearly breast X-ray (mammogram).  If you  have a family history of breast cancer, talk with your health care provider about genetic screening.  If you are at high risk for breast cancer, talk with your health care provider about having an MRI and a mammogram every year.  Breast cancer (BRCA) gene test is recommended for women who have family members with BRCA-related cancers. Results of the assessment will determine the need for genetic counseling and BRCA1 and for BRCA2 testing. BRCA-related cancers include these types: ? Breast. This occurs in males or females. ? Ovarian. ? Tubal. This may also be called fallopian tube cancer. ? Cancer of the abdominal or pelvic lining (peritoneal cancer). ? Prostate. ? Pancreatic.  Cervical, Uterine, and Ovarian Cancer Your health care provider may recommend that you be screened regularly for cancer of the pelvic organs. These include your ovaries, uterus, and vagina. This screening involves a pelvic exam, which includes checking for microscopic changes to the surface of your cervix (Pap test).  For women ages 21-65, health care providers may recommend a pelvic exam and a Pap test every three years. For women ages 79-65, they may recommend the Pap test and pelvic exam, combined with testing for human papilloma virus (HPV), every five years. Some types of HPV increase your risk of cervical cancer. Testing for HPV may also be done on women of any age who have unclear Pap test results.  Other health care providers may not recommend any screening for nonpregnant women who are considered low risk for pelvic cancer and have no symptoms. Ask your health care provider if a screening pelvic exam is right for you.  If you have had past treatment for cervical cancer or a condition that could lead to cancer, you need Pap tests and screening for cancer for at least 20 years after your treatment. If Pap tests have been discontinued for you, your risk factors (such as having a new sexual partner) need to be  reassessed to determine if you should start having screenings again. Some women have medical problems that increase the chance of getting cervical cancer. In these cases, your health care provider may recommend that you have screening and Pap tests more often.  If you have a family history of uterine cancer or ovarian cancer, talk with your health care provider about genetic screening.  If you have vaginal bleeding after reaching menopause, tell your health care provider.  There are currently no reliable tests available to screen for ovarian cancer.  Lung Cancer Lung cancer screening is recommended for adults 69-62 years old who are at high risk for lung cancer because of a history of smoking. A yearly low-dose CT scan of the lungs is recommended if you:  Currently smoke.  Have a history of at least 30 pack-years of smoking and you currently smoke or have quit within the past 15 years. A pack-year is smoking an average of one pack of cigarettes per day for one year.  Yearly screening should:  Continue until it has been 15 years since you quit.  Stop if you develop a health problem that would prevent you from having lung cancer treatment.  Colorectal Cancer  This type of cancer can be detected and can often be prevented.  Routine colorectal cancer screening usually begins at  age 42 and continues through age 45.  If you have risk factors for colon cancer, your health care provider may recommend that you be screened at an earlier age.  If you have a family history of colorectal cancer, talk with your health care provider about genetic screening.  Your health care provider may also recommend using home test kits to check for hidden blood in your stool.  A small camera at the end of a tube can be used to examine your colon directly (sigmoidoscopy or colonoscopy). This is done to check for the earliest forms of colorectal cancer.  Direct examination of the colon should be repeated every  5-10 years until age 71. However, if early forms of precancerous polyps or small growths are found or if you have a family history or genetic risk for colorectal cancer, you may need to be screened more often.  Skin Cancer  Check your skin from head to toe regularly.  Monitor any moles. Be sure to tell your health care provider: ? About any new moles or changes in moles, especially if there is a change in a mole's shape or color. ? If you have a mole that is larger than the size of a pencil eraser.  If any of your family members has a history of skin cancer, especially at a young age, talk with your health care provider about genetic screening.  Always use sunscreen. Apply sunscreen liberally and repeatedly throughout the day.  Whenever you are outside, protect yourself by wearing long sleeves, pants, a wide-brimmed hat, and sunglasses.  What should I know about osteoporosis? Osteoporosis is a condition in which bone destruction happens more quickly than new bone creation. After menopause, you may be at an increased risk for osteoporosis. To help prevent osteoporosis or the bone fractures that can happen because of osteoporosis, the following is recommended:  If you are 46-71 years old, get at least 1,000 mg of calcium and at least 600 mg of vitamin D per day.  If you are older than age 55 but younger than age 65, get at least 1,200 mg of calcium and at least 600 mg of vitamin D per day.  If you are older than age 54, get at least 1,200 mg of calcium and at least 800 mg of vitamin D per day.  Smoking and excessive alcohol intake increase the risk of osteoporosis. Eat foods that are rich in calcium and vitamin D, and do weight-bearing exercises several times each week as directed by your health care provider. What should I know about how menopause affects my mental health? Depression may occur at any age, but it is more common as you become older. Common symptoms of depression  include:  Low or sad mood.  Changes in sleep patterns.  Changes in appetite or eating patterns.  Feeling an overall lack of motivation or enjoyment of activities that you previously enjoyed.  Frequent crying spells.  Talk with your health care provider if you think that you are experiencing depression. What should I know about immunizations? It is important that you get and maintain your immunizations. These include:  Tetanus, diphtheria, and pertussis (Tdap) booster vaccine.  Influenza every year before the flu season begins.  Pneumonia vaccine.  Shingles vaccine.  Your health care provider may also recommend other immunizations. This information is not intended to replace advice given to you by your health care provider. Make sure you discuss any questions you have with your health care provider. Document Released: 02/25/2005  Document Revised: 07/24/2015 Document Reviewed: 10/07/2014 Elsevier Interactive Patient Education  2018 [employer]  

## 2017-11-28 LAB — VITAMIN B12: Vitamin B-12: 166 pg/mL — ABNORMAL LOW (ref 211–911)

## 2017-11-28 LAB — COMPREHENSIVE METABOLIC PANEL
ALT: 14 U/L (ref 0–35)
AST: 16 U/L (ref 0–37)
Albumin: 4.5 g/dL (ref 3.5–5.2)
Alkaline Phosphatase: 68 U/L (ref 39–117)
BUN: 25 mg/dL — ABNORMAL HIGH (ref 6–23)
CO2: 26 mEq/L (ref 19–32)
Calcium: 10.2 mg/dL (ref 8.4–10.5)
Chloride: 106 mEq/L (ref 96–112)
Creatinine, Ser: 1.25 mg/dL — ABNORMAL HIGH (ref 0.40–1.20)
GFR: 55.39 mL/min — ABNORMAL LOW (ref >60.00)
Glucose, Bld: 91 mg/dL (ref 70–99)
Potassium: 5.1 mEq/L (ref 3.5–5.1)
Sodium: 142 mEq/L (ref 135–145)
Total Bilirubin: 0.8 mg/dL (ref 0.2–1.2)
Total Protein: 7.5 g/dL (ref 6.0–8.3)

## 2017-11-28 LAB — LIPID PANEL
Cholesterol: 173 mg/dL (ref 0–200)
HDL: 66.6 mg/dL (ref >39.00)
LDL Cholesterol: 90 mg/dL (ref 0–99)
NonHDL: 106.2
Total CHOL/HDL Ratio: 3
Triglycerides: 79 mg/dL (ref 0.0–149.0)
VLDL: 15.8 mg/dL (ref 0.0–40.0)

## 2017-11-28 LAB — HEMOGLOBIN A1C: Hgb A1c MFr Bld: 6.7 % — ABNORMAL HIGH (ref 4.6–6.5)

## 2017-11-28 LAB — CBC
HCT: 38.6 % (ref 36.0–46.0)
Hemoglobin: 12.9 g/dL (ref 12.0–15.0)
MCHC: 33.5 g/dL (ref 30.0–36.0)
MCV: 87.2 fl (ref 78.0–100.0)
Platelets: 289 10*3/uL (ref 150.0–400.0)
RBC: 4.43 Mil/uL (ref 3.87–5.11)
RDW: 13.6 % (ref 11.5–15.5)
WBC: 6 10*3/uL (ref 4.0–10.5)

## 2017-11-28 LAB — VITAMIN D 25 HYDROXY (VIT D DEFICIENCY, FRACTURES): VITD: 32.17 ng/mL (ref 30.00–100.00)

## 2017-11-29 ENCOUNTER — Encounter: Payer: Self-pay | Admitting: Internal Medicine

## 2017-11-29 DIAGNOSIS — E119 Type 2 diabetes mellitus without complications: Secondary | ICD-10-CM

## 2017-11-30 DIAGNOSIS — K219 Gastro-esophageal reflux disease without esophagitis: Secondary | ICD-10-CM | POA: Insufficient documentation

## 2017-11-30 NOTE — Assessment & Plan Note (Signed)
Discussed how weight loss could help improve reflux Continue TUMS prn for now

## 2017-11-30 NOTE — Assessment & Plan Note (Signed)
Reasonable control on Irbesartan and Hydralazine Reinforced DASH diet She will continue to follow with cardiology

## 2017-11-30 NOTE — Assessment & Plan Note (Signed)
CMET and Lipid profile today Encouraged her to consume a low fat diet. Continue Pravastatin for now 

## 2017-11-30 NOTE — Assessment & Plan Note (Signed)
Discussed medication overuse Ok to continue [employer] prn

## 2017-11-30 NOTE — Assessment & Plan Note (Signed)
Stable off meds °Will monitor °

## 2017-11-30 NOTE — Assessment & Plan Note (Signed)
A1C today No microalbumin secondary to ARB therapy Encouraged her to consume a low carb diet and exercise for weight loss Continue Metformin Foot exam today Encouraged yearly eye exams She declines flu or pneumovax

## 2017-11-30 NOTE — Assessment & Plan Note (Signed)
In remission Continue Arimadex She will continue to follow with oncology

## 2017-12-01 MED ORDER — GLIPIZIDE 5 MG PO TABS: 5.0000 mg | ORAL_TABLET | Freq: Every day | ORAL | 0 refills | Status: DC

## 2017-12-04 ENCOUNTER — Other Ambulatory Visit: Payer: Self-pay | Admitting: Internal Medicine

## 2017-12-12 ENCOUNTER — Other Ambulatory Visit: Payer: Self-pay | Admitting: Internal Medicine

## 2018-01-01 ENCOUNTER — Other Ambulatory Visit (INDEPENDENT_AMBULATORY_CARE_PROVIDER_SITE_OTHER): Payer: Medicare Other

## 2018-01-01 DIAGNOSIS — E119 Type 2 diabetes mellitus without complications: Secondary | ICD-10-CM | POA: Diagnosis not present

## 2018-01-01 LAB — HEMOGLOBIN A1C: Hgb A1c MFr Bld: 6.6 % — ABNORMAL HIGH (ref 4.6–6.5)

## 2018-01-22 ENCOUNTER — Emergency Department (HOSPITAL_COMMUNITY): Payer: Medicare Other

## 2018-01-22 ENCOUNTER — Emergency Department (HOSPITAL_COMMUNITY)
Admission: EM | Admit: 2018-01-22 | Discharge: 2018-01-23 | Disposition: A | Discharge status: Home / Self Care | Payer: Medicare Other | Attending: Emergency Medicine | Admitting: Emergency Medicine

## 2018-01-22 ENCOUNTER — Encounter (HOSPITAL_COMMUNITY): Payer: Self-pay | Admitting: *Deleted

## 2018-01-22 DIAGNOSIS — E119 Type 2 diabetes mellitus without complications: Secondary | ICD-10-CM | POA: Insufficient documentation

## 2018-01-22 DIAGNOSIS — I10 Essential (primary) hypertension: Secondary | ICD-10-CM

## 2018-01-22 DIAGNOSIS — Z79899 Other long term (current) drug therapy: Secondary | ICD-10-CM | POA: Insufficient documentation

## 2018-01-22 DIAGNOSIS — R51 Headache: Secondary | ICD-10-CM | POA: Insufficient documentation

## 2018-01-22 DIAGNOSIS — Z7982 Long term (current) use of aspirin: Secondary | ICD-10-CM | POA: Insufficient documentation

## 2018-01-22 DIAGNOSIS — R519 Headache, unspecified: Secondary | ICD-10-CM

## 2018-01-22 LAB — CBC
HCT: 38.7 % (ref 36.0–46.0)
Hemoglobin: 12.1 g/dL (ref 12.0–15.0)
MCH: 27.9 pg (ref 26.0–34.0)
MCHC: 31.3 g/dL (ref 30.0–36.0)
MCV: 89.4 fL (ref 80.0–100.0)
Platelets: 245 10*3/uL (ref 150–400)
RBC: 4.33 MIL/uL (ref 3.87–5.11)
RDW: 13.2 % (ref 11.5–15.5)
WBC: 5.5 10*3/uL (ref 4.0–10.5)
nRBC: 0 % (ref 0.0–0.2)

## 2018-01-22 LAB — I-STAT CHEM 8, ED
BUN: 15 mg/dL (ref 8–23)
Calcium, Ion: 1.04 mmol/L — ABNORMAL LOW (ref 1.15–1.40)
Chloride: 111 mmol/L (ref 98–111)
Creatinine, Ser: 1.1 mg/dL — ABNORMAL HIGH (ref 0.44–1.00)
Glucose, Bld: 110 mg/dL — ABNORMAL HIGH (ref 70–99)
HCT: 37 % (ref 36.0–46.0)
Hemoglobin: 12.6 g/dL (ref 12.0–15.0)
Potassium: 3.6 mmol/L (ref 3.5–5.1)
Sodium: 142 mmol/L (ref 135–145)
TCO2: 24 mmol/L (ref 22–32)

## 2018-01-22 LAB — COMPREHENSIVE METABOLIC PANEL
ALT: 18 U/L (ref 0–44)
AST: 16 U/L (ref 15–41)
Albumin: 4 g/dL (ref 3.5–5.0)
Alkaline Phosphatase: 67 U/L (ref 38–126)
Anion gap: 7 (ref 5–15)
BUN: 15 mg/dL (ref 8–23)
CO2: 24 mmol/L (ref 22–32)
Calcium: 9.5 mg/dL (ref 8.9–10.3)
Chloride: 111 mmol/L (ref 98–111)
Creatinine, Ser: 1.11 mg/dL — ABNORMAL HIGH (ref 0.44–1.00)
GFR calc Af Amer: >60 mL/min (ref >60)
GFR calc non Af Amer: 52 mL/min — ABNORMAL LOW (ref >60)
Glucose, Bld: 120 mg/dL — ABNORMAL HIGH (ref 70–99)
Potassium: 4 mmol/L (ref 3.5–5.1)
Sodium: 142 mmol/L (ref 135–145)
Total Bilirubin: 1.1 mg/dL (ref 0.3–1.2)
Total Protein: 7 g/dL (ref 6.5–8.1)

## 2018-01-22 LAB — DIFFERENTIAL
Abs Immature Granulocytes: 0.01 10*3/uL (ref 0.00–0.07)
Basophils Absolute: 0 10*3/uL (ref 0.0–0.1)
Basophils Relative: 1 %
Eosinophils Absolute: 0.1 10*3/uL (ref 0.0–0.5)
Eosinophils Relative: 2 %
Immature Granulocytes: 0 %
Lymphocytes Relative: 41 %
Lymphs Abs: 2.3 10*3/uL (ref 0.7–4.0)
Monocytes Absolute: 0.5 10*3/uL (ref 0.1–1.0)
Monocytes Relative: 8 %
Neutro Abs: 2.6 10*3/uL (ref 1.7–7.7)
Neutrophils Relative %: 48 %

## 2018-01-22 LAB — I-STAT TROPONIN, ED: Troponin i, poc: 0.01 ng/mL (ref 0.00–0.08)

## 2018-01-22 LAB — URINALYSIS, ROUTINE W REFLEX MICROSCOPIC
Bacteria, UA: NONE SEEN
Bilirubin Urine: NEGATIVE
Glucose, UA: NEGATIVE mg/dL
Hgb urine dipstick: NEGATIVE
Ketones, ur: NEGATIVE mg/dL
Nitrite: NEGATIVE
Protein, ur: NEGATIVE mg/dL
Specific Gravity, Urine: 1.017 (ref 1.005–1.030)
pH: 6 (ref 5.0–8.0)

## 2018-01-22 LAB — PROTIME-INR
INR: 1.03
Prothrombin Time: 13.4 seconds (ref 11.4–15.2)

## 2018-01-22 LAB — APTT: aPTT: 31 seconds (ref 24–36)

## 2018-01-22 MED ORDER — HYDRALAZINE HCL 20 MG/ML IJ SOLN
5.0000 mg | Freq: Once | INTRAMUSCULAR | Status: AC
  Administered 2018-01-22: 5 mg via INTRAVENOUS
  Filled 2018-01-22: qty 1

## 2018-01-22 NOTE — ED Triage Notes (Signed)
Pt in c/o hypertension at home, up to 170/110, reports headache for the last few days as well, denies recent changes in her BP medication, no distress noted

## 2018-01-22 NOTE — ED Notes (Signed)
Patient verbalizes understanding of discharge instructions. Opportunity for questioning and answers were provided. Armband removed by staff, pt discharged from ED.  

## 2018-01-22 NOTE — ED Provider Notes (Signed)
[facility] EMERGENCY DEPARTMENT Provider Note   CSN: [id number] Arrival date & time: 01/22/18  1340     History   Chief Complaint Chief Complaint  Patient presents with  . Hypertension    HPI Carrie Singh is a 63 y.o. female.  Patient with history of breast cancer, diabetes, hyperlipidemia, hypertension --presents the emergency department with complaint of headache and elevated blood pressure.  Patient states that she has had chronic headaches for several years.  Headache is generalized with radiation in her left neck.  She states that her headache symptoms are currently typical for her.  Patient reports feeling very weak starting yesterday.  She was not feeling herself so her husband checked her blood pressure and found it to be high.  Doctor recommended going to the fire department for recheck today.  After having her blood pressure checked there, she was advised to go to the emergency department.  Patient reports some disequilibrium with walking and some weakness in her left leg that is new since yesterday.  No vision changes, slurred speech.  No treatments prior to arrival.  No history of stroke.     Past Medical History:  Diagnosis Date  . Breast cancer ([place])   . Breast cancer, right breast ([place]) 12/2007   a. s/p chemo, radiation, and lumpectomy with negative sentinel lymph node biopsy   . Depression   . Diabetes mellitus without complication ([place])   . GERD (gastroesophageal reflux disease)   . Hyperlipidemia   . Hypertension    resistant, negative renal Dopplers and negative CT angiogram  . Migraines   . Obesity   . Seizures ([place])    last seizure  1 week ago: states she jumps for a second and it's gone    Patient Active Problem List   Diagnosis Date Noted  . GERD (gastroesophageal reflux disease) 11/30/2017  . Intractable episodic cluster headache 12/04/2014  . Depression with somatization 12/04/2014  . HTN (hypertension) 04/24/2013  . HLD  (hyperlipidemia) 04/24/2013  . DM2 (diabetes mellitus, type 2) ([place]) 04/24/2013  . Malignant neoplasm of lower-outer quadrant of right breast of female, estrogen receptor positive ([place]) 12/18/2007    Past Surgical History:  Procedure Laterality Date  . ABDOMINAL HYSTERECTOMY    . BREAST LUMPECTOMY    . CYST EXCISION    . FOOT SURGERY    . MASTECTOMY W/ SENTINEL NODE BIOPSY Right 03/18/2016   Procedure: RIGHT MASTECTOMY WITH RIGHT AXILLARY SENTINEL LYMPH NODE BIOPSY;  Surgeon: Alphonsa Overall, MD;  Location: [facility];  Service: General;  Laterality: Right;  . ovary tumor    . vocal cord nodules       OB History   No obstetric history on file.      Home Medications    Prior to Admission medications   Medication Sig Start Date End Date Taking? Authorizing Provider  anastrozole (ARIMIDEX) 1 MG tablet Take 1 tablet (1 mg total) by mouth daily. 06/23/17  Yes Nicholas Lose, MD  Aspirin-Acetaminophen-Caffeine (GOODY HEADACHE PO) Take 1 packet by mouth as needed (for migraines).    Yes [provider]  Biotin w/ Vitamins C & E (HAIR SKIN & NAILS GUMMIES PO) Take 1 tablet by mouth daily.    Yes [provider]  glipiZIDE (GLUCOTROL) 5 MG tablet Take 1 tablet (5 mg total) by mouth daily before breakfast. 12/01/17  Yes Baity, Coralie Keens, NP  hydrALAZINE (APRESOLINE) 100 MG tablet Take 1 tablet (100 mg total) by mouth 3 (three) times  daily. 05/12/17  Yes Lorretta Harp, MD  irbesartan (AVAPRO) 300 MG tablet Take 1 tablet (300 mg total) by mouth daily. 05/12/17  Yes Lorretta Harp, MD  Multiple Vitamins-Minerals (ONE-A-DAY WOMENS VITACRAVES) CHEW Chew 1 tablet by mouth daily.    Yes [provider]  pravastatin (PRAVACHOL) 40 MG tablet TAKE 1 TABLET BY MOUTH  DAILY Patient taking differently: Take 40 mg by mouth daily.  12/04/17  Yes Jearld [place], NP    Family History Family History  Problem Relation Age of Onset  . Stroke Mother   . Heart disease Father   . Asthma  Father   . Cancer Father        colon  . Cancer Sister        lung  . Cancer Brother        colon  . Breast cancer Other 48  . Diabetes Neg Hx     Social History Social History   Tobacco Use  . Smoking status: Never Smoker  . Smokeless tobacco: Never Used  Substance Use Topics  . Alcohol use: No    Alcohol/week: 0.0 standard drinks  . Drug use: No     Allergies   Patient has no known allergies.   Review of Systems Review of Systems  Constitutional: Negative for fever.  HENT: Negative for congestion, dental problem, rhinorrhea and sinus pressure.   Eyes: Negative for photophobia, discharge, redness and visual disturbance.  Respiratory: Negative for shortness of breath.   Cardiovascular: Negative for chest pain.  Gastrointestinal: Negative for nausea and vomiting.  Musculoskeletal: Positive for neck pain. Negative for gait problem and neck stiffness.  Skin: Negative for rash.  Neurological: Positive for weakness and headaches. Negative for syncope, speech difficulty, light-headedness and numbness.  Psychiatric/Behavioral: Negative for confusion.     Physical Exam Updated Vital Signs BP (!) 163/108   Pulse 85   Temp 98.9 F (37.2 C) (Oral)   Resp 17   SpO2 99%   Physical Exam Vitals signs and nursing note reviewed.  Constitutional:      Appearance: She is well-developed.  HENT:     Head: Normocephalic and atraumatic.     Right Ear: Tympanic membrane, ear canal and external ear normal.     Left Ear: Tympanic membrane, ear canal and external ear normal.     Nose: Nose normal.     Mouth/Throat:     Pharynx: Uvula midline.  Eyes:     General: Lids are normal.     Extraocular Movements:     Right eye: No nystagmus.     Left eye: No nystagmus.     Conjunctiva/sclera: Conjunctivae normal.     Pupils: Pupils are equal, round, and reactive to light.  Neck:     Musculoskeletal: Normal range of motion and neck supple.  Cardiovascular:     Rate and Rhythm:  Normal rate and regular rhythm.  Pulmonary:     Effort: Pulmonary effort is normal.     Breath sounds: Normal breath sounds.  Abdominal:     Palpations: Abdomen is soft.     Tenderness: There is no abdominal tenderness.  Musculoskeletal:     Cervical back: She exhibits normal range of motion, no tenderness and no bony tenderness.  Skin:    General: Skin is warm and dry.  Neurological:     Mental Status: She is alert and oriented to person, place, and time.     GCS: GCS eye subscore is 4. GCS verbal  subscore is 5. GCS motor subscore is 6.     Cranial Nerves: No cranial nerve deficit.     Sensory: No sensory deficit.     Coordination: Coordination normal.     Gait: Gait normal.     Deep Tendon Reflexes: Reflexes are normal and symmetric.     Comments: Trace weakness L LE, she cannot hold her leg off the bed for more than a couple of seconds.       ED Treatments / Results  Labs (all labs ordered are listed, but only abnormal results are displayed) Labs Reviewed  COMPREHENSIVE METABOLIC PANEL - Abnormal; Notable for the following components:      Result Value   Glucose, Bld 120 (*)    Creatinine, Ser 1.11 (*)    GFR calc non Af Amer 52 (*)    All other components within normal limits  URINALYSIS, ROUTINE W REFLEX MICROSCOPIC - Abnormal; Notable for the following components:   Leukocytes, UA TRACE (*)    All other components within normal limits  I-STAT CHEM 8, ED - Abnormal; Notable for the following components:   Creatinine, Ser 1.10 (*)    Glucose, Bld 110 (*)    Calcium, Ion 1.04 (*)    All other components within normal limits  PROTIME-INR  APTT  CBC  DIFFERENTIAL  I-STAT TROPONIN, ED    EKG EKG Interpretation  Date/Time:  Monday January 22 2018 19:57:01 EST Ventricular Rate:  76 PR Interval:    QRS Duration: 162 QT Interval:  440 QTC Calculation: 495 R Axis:   -35 Text Interpretation:  Sinus rhythm Right bundle branch block Left ventricular hypertrophy  Abnormal ekg Confirmed by Carmin Muskrat [id number]) on 01/22/2018 8:29:22 PM   Radiology Ct Head Wo Contrast  Result Date: 01/22/2018 CLINICAL DATA:  Hypertension and headache. EXAM: CT HEAD WITHOUT CONTRAST TECHNIQUE: Contiguous axial images were obtained from the base of the skull through the vertex without intravenous contrast. COMPARISON:  05/31/2017; brain MRI-05/31/2017 FINDINGS: Brain: Redemonstrated atrophy with sulcal prominence and centralized atrophy with commensurate ex vacuo dilatation of the ventricular system. Gray-white differentiation is maintained. No CT evidence of acute large territory infarct. No intraparenchymal or extra-axial mass or hemorrhage. Unchanged size and configuration of the ventricles and the basilar cisterns. No midline shift. Vascular: Intracranial atherosclerosis. Skull: No displaced calvarial fracture. Sinuses/Orbits: Under pneumatization of the bilateral frontal sinuses. The remaining paranasal sinuses and mastoid air cells are normally aerated. No air-fluid levels. Other: Regional soft tissues appear normal. IMPRESSION: Similar findings of atrophy without superimposed acute intracranial process. Electronically Signed   By: Sandi Mariscal M.D.   On: 01/22/2018 20:16    Procedures Procedures (including critical care time)  Medications Ordered in ED Medications  hydrALAZINE (APRESOLINE) injection 5 mg (5 mg Intravenous Given 01/22/18 2154)     Initial Impression / Assessment and Plan / ED Course  I have reviewed the triage vital signs and the nursing notes.  Pertinent labs & imaging results that were available during my care of the patient were reviewed by me and considered in my medical decision making (see chart for details).  Clinical Course as of Jan 23 2012  Mon Jan 22, 2018  1958 CT HEAD WO CONTRAST [HE]    Clinical Course User Index [HE] Dorise Bullion, [place]    Patient seen and examined. Work-up initiated.  Patient discussed with and seen by Dr.  Vanita Panda.  Vital signs reviewed and are as follows: BP (!) 182/99 (BP Location: Left Arm)  Pulse 91   Temp 98.9 F (37.2 C) (Oral)   Resp 16   SpO2 99%   CT results reviewed.  No indication of stroke.  Per discussion with patient by Dr. Vanita Panda, will forego additional MRI imaging tonight.  Plan is for additional blood pressure medications, discharge if doing well.  11:11 PM patient updated on results.  Diastolic blood pressure less than 100.  Pending UA results.   Per Dr. Vanita Panda -- initiate low dose propranolol 10mg  bid.   Patient urged to return with worsening symptoms or other concerns. Patient verbalized understanding and agrees with plan.    Final Clinical Impressions(s) / ED Diagnoses   Final diagnoses:  Hypertension, unspecified type  Acute nonintractable headache, unspecified headache type   HTN: Elevated today.  Patient with some left lower extremity weakness which is reportedly new.  She was evaluated with stroke work-up including head CT.  This was reassuring.  Patient discussed with and seen by attending physician.  Cleared for discharged home when blood pressure improved.  IV hydralazine given.  HA: Patient with chronic HA, today's episode similar to previous.  She has had extensive work-up for this.  CT today without signs of bleed.  Patient without high-risk features of headache including: sudden onset/thunderclap HA, no similar headache in past, altered mental status, accompanying seizure, headache with exertion, history of immunocompromise, fever, use of anticoagulation, family history of spontaneous SAH, concomitant drug use, toxic exposure.   Patient has a normal complete neurological exam, normal vital signs, normal level of consciousness, no signs of meningismus, is well-appearing/non-toxic appearing, no signs of trauma, no pain over the temporal arteries.   No dangerous or life-threatening conditions suspected or identified by history, physical exam, and by  work-up. No indications for hospitalization identified.     ED Discharge Orders         Ordered    propranolol (INDERAL) 10 MG tablet  2 times daily     01/23/18 0007           Carlisle Cater, PA-C 01/23/18 Duanne Limerick, MD 01/23/18 [phone]

## 2018-01-22 NOTE — Discharge Instructions (Signed)
Please read and follow all provided instructions.  Your diagnoses today include:  1. Hypertension, unspecified type   2. Acute nonintractable headache, unspecified headache type     Tests performed today include:  CT of your head which was normal and did not show any serious cause of your headache  Blood counts and electrolytes  Vital signs. See below for your results today.   Medications:   None  Take any prescribed medications only as directed.  Additional information:  Follow any educational materials contained in this packet.  You are having a headache. No specific cause was found today for your headache. It may have been a migraine or other cause of headache. Stress, anxiety, fatigue, and depression are common triggers for headaches.   Your headache today does not appear to be life-threatening or require hospitalization, but often the exact cause of headaches is not determined in the emergency department. Therefore, follow-up with your doctor is very important to find out what may have caused your headache and whether or not you need any further diagnostic testing or treatment.   Sometimes headaches can appear benign (not harmful), but then more serious symptoms can develop which should prompt an immediate re-evaluation by your doctor or the emergency department.  BE VERY CAREFUL not to take multiple medicines containing Tylenol (also called acetaminophen). Doing so can lead to an overdose which can damage your liver and cause liver failure and possibly death.   Follow-up instructions: Please follow-up with your primary care provider in the next 3 days for further evaluation of your symptoms.   Return instructions:   Please return to the Emergency Department if you experience worsening symptoms.  Return if the medications do not resolve your headache, if it recurs, or if you have multiple episodes of vomiting or cannot keep down fluids.  Return if you have a change from  the usual headache.  RETURN IMMEDIATELY IF you:  Develop a sudden, severe headache  Develop confusion or become poorly responsive or faint  Develop a fever above 100.38F or problem breathing  Have a change in speech, vision, swallowing, or understanding  Develop new weakness, numbness, tingling, incoordination in your arms or legs  Have a seizure  Please return if you have any other emergent concerns.  Additional Information:  Your vital signs today were: BP (!) 182/99 (BP Location: Left Arm)    Pulse 91    Temp 98.9 F (37.2 C) (Oral)    Resp 16    SpO2 99%  If your blood pressure (BP) was elevated above 135/85 this visit, please have this repeated by your doctor within one month. --------------

## 2018-01-23 MED ORDER — PROPRANOLOL HCL 10 MG PO TABS: 10.0000 mg | ORAL_TABLET | Freq: Two times a day (BID) | ORAL | 0 refills | Status: DC

## 2018-01-24 ENCOUNTER — Telehealth: Payer: Self-pay

## 2018-01-24 ENCOUNTER — Emergency Department (HOSPITAL_COMMUNITY)
Admission: EM | Admit: 2018-01-24 | Discharge: 2018-01-25 | Disposition: A | Discharge status: Home / Self Care | Payer: Medicare Other | Attending: Emergency Medicine | Admitting: Emergency Medicine

## 2018-01-24 ENCOUNTER — Encounter (HOSPITAL_COMMUNITY): Payer: Self-pay | Admitting: Emergency Medicine

## 2018-01-24 DIAGNOSIS — Z79899 Other long term (current) drug therapy: Secondary | ICD-10-CM | POA: Diagnosis not present

## 2018-01-24 DIAGNOSIS — R030 Elevated blood-pressure reading, without diagnosis of hypertension: Secondary | ICD-10-CM | POA: Insufficient documentation

## 2018-01-24 DIAGNOSIS — Z853 Personal history of malignant neoplasm of breast: Secondary | ICD-10-CM | POA: Insufficient documentation

## 2018-01-24 DIAGNOSIS — E119 Type 2 diabetes mellitus without complications: Secondary | ICD-10-CM | POA: Insufficient documentation

## 2018-01-24 DIAGNOSIS — I10 Essential (primary) hypertension: Secondary | ICD-10-CM | POA: Diagnosis not present

## 2018-01-24 DIAGNOSIS — R51 Headache: Secondary | ICD-10-CM

## 2018-01-24 DIAGNOSIS — R519 Headache, unspecified: Secondary | ICD-10-CM

## 2018-01-24 LAB — CBC WITH DIFFERENTIAL/PLATELET
Abs Immature Granulocytes: 0.01 10*3/uL (ref 0.00–0.07)
Basophils Absolute: 0.1 10*3/uL (ref 0.0–0.1)
Basophils Relative: 1 %
Eosinophils Absolute: 0.1 10*3/uL (ref 0.0–0.5)
Eosinophils Relative: 2 %
HCT: 36.8 % (ref 36.0–46.0)
Hemoglobin: 11.7 g/dL — ABNORMAL LOW (ref 12.0–15.0)
Immature Granulocytes: 0 %
Lymphocytes Relative: 40 %
Lymphs Abs: 2.1 10*3/uL (ref 0.7–4.0)
MCH: 28.7 pg (ref 26.0–34.0)
MCHC: 31.8 g/dL (ref 30.0–36.0)
MCV: 90.2 fL (ref 80.0–100.0)
Monocytes Absolute: 0.3 10*3/uL (ref 0.1–1.0)
Monocytes Relative: 6 %
Neutro Abs: 2.7 10*3/uL (ref 1.7–7.7)
Neutrophils Relative %: 51 %
Platelets: 230 10*3/uL (ref 150–400)
RBC: 4.08 MIL/uL (ref 3.87–5.11)
RDW: 13.2 % (ref 11.5–15.5)
WBC: 5.3 10*3/uL (ref 4.0–10.5)
nRBC: 0 % (ref 0.0–0.2)

## 2018-01-24 LAB — I-STAT TROPONIN, ED: Troponin i, poc: 0 ng/mL (ref 0.00–0.08)

## 2018-01-24 LAB — BASIC METABOLIC PANEL
Anion gap: 9 (ref 5–15)
BUN: 16 mg/dL (ref 8–23)
CO2: 22 mmol/L (ref 22–32)
Calcium: 9 mg/dL (ref 8.9–10.3)
Chloride: 109 mmol/L (ref 98–111)
Creatinine, Ser: 0.95 mg/dL (ref 0.44–1.00)
GFR calc Af Amer: >60 mL/min (ref >60)
GFR calc non Af Amer: >60 mL/min (ref >60)
Glucose, Bld: 154 mg/dL — ABNORMAL HIGH (ref 70–99)
Potassium: 3.4 mmol/L — ABNORMAL LOW (ref 3.5–5.1)
Sodium: 140 mmol/L (ref 135–145)

## 2018-01-24 MED ORDER — HYDRALAZINE HCL 20 MG/ML IJ SOLN
10.0000 mg | Freq: Once | INTRAMUSCULAR | Status: AC
  Administered 2018-01-24: 10 mg via INTRAVENOUS
  Filled 2018-01-24: qty 1

## 2018-01-24 MED ORDER — HYDROCHLOROTHIAZIDE 12.5 MG PO CAPS
12.5000 mg | ORAL_CAPSULE | Freq: Once | ORAL | Status: AC
  Administered 2018-01-24: 12.5 mg via ORAL
  Filled 2018-01-24: qty 1

## 2018-01-24 MED ORDER — HYDRALAZINE HCL 20 MG/ML IJ SOLN
5.0000 mg | Freq: Once | INTRAMUSCULAR | Status: AC
  Administered 2018-01-24: 5 mg via INTRAVENOUS
  Filled 2018-01-24: qty 1

## 2018-01-24 MED ORDER — POTASSIUM CHLORIDE CRYS ER 20 MEQ PO TBCR
20.0000 meq | EXTENDED_RELEASE_TABLET | Freq: Once | ORAL | Status: AC
  Administered 2018-01-25: 20 meq via ORAL
  Filled 2018-01-24: qty 1

## 2018-01-24 NOTE — ED Provider Notes (Signed)
   Face-to-face evaluation   History: Presents for evaluation of headaches and high blood pressure.  Headache present for 1 month.  She denies fever, chills, focal weakness paresthesia.  She states that both of her legs feel weak and she has trouble walking up stairs but can do it if she pulls herself up the steps.  Physical exam: Alert, calm, cooperative.  Dysarthria or aphasia.  No respiratory distress.  Normal motion arms and legs bilaterally.  No facial asymmetry.  Medical screening examination/treatment/procedure(s) were conducted as a shared visit with non-physician practitioner(s) and myself.  I personally evaluated the patient during the encounter     Daleen Bo, MD 01/27/18 1734

## 2018-01-24 NOTE — Telephone Encounter (Signed)
Agree with ER eval.

## 2018-01-24 NOTE — Telephone Encounter (Signed)
Anastasiya CMA called pt about TCM for ED FU and I was asked to triage pt; pt seen [facility] on 01/22/17; pt BP at ED was 163/108. Today BP was 177/115; pt has HA pain level 9; SOB going up steps which pt said is new symptom. And pt has weakness in lt leg which pt said is new symptom. No dizziness, CP, vision changes and pt is speaking slowly but pts husband said that is normal for pt. Pt also said she has no energy. Pt did not know she was supposed to take propranolol and pt and husband are in vehicle on their way to church. I advised pt needs to go to ED for eval now. Pt said she would not go to [facility], [facility] or [facility] ED. Pt did say she would go to [facility] now. FYI to Avie Echevaria NP who is not at office and Dr Danise Mina who is at office.

## 2018-01-24 NOTE — ED Triage Notes (Signed)
Pt c/o issues with BP going up and down for couple days. Was seen at [facility] ED 2 days ago for same symptoms.  C/o headache on left side for several days, denies n/v or blurred vision.

## 2018-01-24 NOTE — ED Provider Notes (Signed)
[facility] DEPT Provider Note   CSN: [id number] Arrival date & time: 01/24/18  1731     History   Chief Complaint Chief Complaint  Patient presents with  . Hypertension    HPI Carrie Singh is a 63 y.o. female.  HPI   Patient is a 63 year old female with history of hypertension, hyperlipidemia, migraines, presenting for left-sided headache and hypertension.  Patient reports that she has had a headache for the past several days and is unchanged.  She reports it is recurring and remitting.  She reports that it is consistent with all of her prior headaches which she is getting more more frequently.  She reports that she is taking her blood pressure this week it is noted to be greater than 623 systolic each time.  She is recommended to come the emergency department by the fire department.  She is evaluated 2 days ago due to headache, hypertension, and sensation that she thought that her left leg was weaker than normal.  She denies any vision disturbance, changes in speech, changes in mentation, chest pain, shortness of breath, vertigo, lightheadedness, or any new or worsening neurologic symptoms.  Patient reports that her hydrochlorothiazide was discontinued in November and she is not sure why.  This was previously prescribed by her cardiologist.  Past Medical History:  Diagnosis Date  . Breast cancer ([place])   . Breast cancer, right breast ([place]) 12/2007   a. s/p chemo, radiation, and lumpectomy with negative sentinel lymph node biopsy   . Depression   . Diabetes mellitus without complication ([place])   . GERD (gastroesophageal reflux disease)   . Hyperlipidemia   . Hypertension    resistant, negative renal Dopplers and negative CT angiogram  . Migraines   . Obesity   . Seizures ([place])    last seizure  1 week ago: states she jumps for a second and it's gone    Patient Active Problem List   Diagnosis Date Noted  . GERD (gastroesophageal reflux disease)  11/30/2017  . Intractable episodic cluster headache 12/04/2014  . Depression with somatization 12/04/2014  . HTN (hypertension) 04/24/2013  . HLD (hyperlipidemia) 04/24/2013  . DM2 (diabetes mellitus, type 2) ([place]) 04/24/2013  . Malignant neoplasm of lower-outer quadrant of right breast of female, estrogen receptor positive ([place]) 12/18/2007    Past Surgical History:  Procedure Laterality Date  . ABDOMINAL HYSTERECTOMY    . BREAST LUMPECTOMY    . CYST EXCISION    . FOOT SURGERY    . MASTECTOMY W/ SENTINEL NODE BIOPSY Right 03/18/2016   Procedure: RIGHT MASTECTOMY WITH RIGHT AXILLARY SENTINEL LYMPH NODE BIOPSY;  Surgeon: Alphonsa Overall, MD;  Location: [facility];  Service: General;  Laterality: Right;  . ovary tumor    . vocal cord nodules       OB History   No obstetric history on file.      Home Medications    Prior to Admission medications   Medication Sig Start Date End Date Taking? Authorizing Provider  anastrozole (ARIMIDEX) 1 MG tablet Take 1 tablet (1 mg total) by mouth daily. 06/23/17  Yes Nicholas Lose, MD  Aspirin-Acetaminophen-Caffeine (GOODY HEADACHE PO) Take 1 packet by mouth as needed (for migraines).    Yes [provider]  Biotin w/ Vitamins C & E (HAIR SKIN & NAILS GUMMIES PO) Take 1 tablet by mouth daily.    Yes [provider]  glipiZIDE (GLUCOTROL) 5 MG tablet Take 1 tablet (5 mg total) by mouth daily before  breakfast. 12/01/17  Yes Baity, Coralie Keens, NP  hydrALAZINE (APRESOLINE) 100 MG tablet Take 1 tablet (100 mg total) by mouth 3 (three) times daily. 05/12/17  Yes Lorretta Harp, MD  irbesartan (AVAPRO) 300 MG tablet Take 1 tablet (300 mg total) by mouth daily. 05/12/17  Yes Lorretta Harp, MD  Multiple Vitamins-Minerals (ONE-A-DAY WOMENS VITACRAVES) CHEW Chew 1 tablet by mouth daily.    Yes [provider]  pravastatin (PRAVACHOL) 40 MG tablet TAKE 1 TABLET BY MOUTH  DAILY Patient taking differently: Take 40 mg by mouth daily.  12/04/17   Yes Jearld Fenton, NP  propranolol (INDERAL) 10 MG tablet Take 1 tablet (10 mg total) by mouth 2 (two) times daily. 01/23/18   Carlisle Cater, PA-C    Family History Family History  Problem Relation Age of Onset  . Stroke Mother   . Heart disease Father   . Asthma Father   . Cancer Father        colon  . Cancer Sister        lung  . Cancer Brother        colon  . Breast cancer Other 48  . Diabetes Neg Hx     Social History Social History   Tobacco Use  . Smoking status: Never Smoker  . Smokeless tobacco: Never Used  Substance Use Topics  . Alcohol use: No    Alcohol/week: 0.0 standard drinks  . Drug use: No     Allergies   Patient has no known allergies.   Review of Systems Review of Systems  Constitutional: Negative for chills and fever.  HENT: Negative for congestion, rhinorrhea, sinus pain and sore throat.   Eyes: Negative for visual disturbance.  Respiratory: Negative for cough, chest tightness and shortness of breath.   Cardiovascular: Negative for chest pain, palpitations and leg swelling.  Gastrointestinal: Negative for abdominal pain, nausea and vomiting.  Genitourinary: Negative for dysuria and flank pain.  Musculoskeletal: Negative for back pain and myalgias.  Skin: Negative for rash.  Neurological: Positive for headaches. Negative for dizziness, seizures, syncope, speech difficulty, weakness, light-headedness and numbness.     Physical Exam Updated Vital Signs BP (!) 183/105   Pulse 78   Temp 98.4 F (36.9 C) (Oral)   Resp 15   Ht 5' 5.25" (1.657 m)   Wt 100.8 kg   SpO2 98%   BMI 36.69 kg/m   Physical Exam Vitals signs and nursing note reviewed.  Constitutional:      General: She is not in acute distress.    Appearance: She is well-developed.  HENT:     Head: Normocephalic and atraumatic.  Eyes:     Conjunctiva/sclera: Conjunctivae normal.     Pupils: Pupils are equal, round, and reactive to light.  Neck:     Musculoskeletal:  Normal range of motion and neck supple.  Cardiovascular:     Rate and Rhythm: Normal rate and regular rhythm.     Pulses:          Radial pulses are 2+ on the right side and 2+ on the left side.       Dorsalis pedis pulses are 2+ on the right side and 2+ on the left side.     Heart sounds: S1 normal and S2 normal. No murmur.     Comments: Trace, nonpitting and symmetric lower extremity edema. Pulmonary:     Effort: Pulmonary effort is normal.     Breath sounds: Normal breath sounds. No wheezing  or rales.  Abdominal:     General: There is no distension.     Palpations: Abdomen is soft.     Tenderness: There is no abdominal tenderness. There is no guarding.  Musculoskeletal: Normal range of motion.        General: No deformity.  Lymphadenopathy:     Cervical: No cervical adenopathy.  Skin:    General: Skin is warm and dry.     Findings: No erythema or rash.  Neurological:     Mental Status: She is alert.     Comments: Mental Status:  Alert, oriented, thought content appropriate, able to give a coherent history. Speech fluent without evidence of aphasia. Able to follow 2 step commands without difficulty.  Cranial Nerves:  II:  Peripheral visual fields grossly normal, pupils equal, round, reactive to light III,IV, VI: ptosis not present, extra-ocular motions intact bilaterally  V,VII: smile symmetric, facial light touch sensation equal VIII: hearing grossly normal to voice  X: uvula elevates symmetrically  XI: bilateral shoulder shrug symmetric and strong XII: midline tongue extension without fassiculations Motor:  Normal tone. 5/5 in upper and lower extremities bilaterally including strong and equal grip strength and dorsiflexion/plantar flexion Sensory: Pinprick and light touch normal in all extremities.  Deep Tendon Reflexes: 2+ and symmetric in the biceps and patella. No clonus. Cerebellar: normal finger-to-nose with bilateral upper extremities Gait: normal gait and  balance Stance:  No pronator drift and good coordination, strength, and position sense with tapping of bilateral arms (performed in sitting position). CV: distal pulses palpable throughout    Psychiatric:        Behavior: Behavior normal.        Thought Content: Thought content normal.        Judgment: Judgment normal.      ED Treatments / Results  Labs (all labs ordered are listed, but only abnormal results are displayed) Labs Reviewed  BASIC METABOLIC PANEL - Abnormal; Notable for the following components:      Result Value   Potassium 3.4 (*)    Glucose, Bld 154 (*)    All other components within normal limits  CBC WITH DIFFERENTIAL/PLATELET - Abnormal; Notable for the following components:   Hemoglobin 11.7 (*)    All other components within normal limits  I-STAT TROPONIN, ED    EKG EKG Interpretation  Date/Time:  Wednesday January 24 2018 21:49:24 EST Ventricular Rate:  85 PR Interval:    QRS Duration: 157 QT Interval:  442 QTC Calculation: 526 R Axis:   -42 Text Interpretation:  Sinus rhythm RBBB and LAFB Probable left ventricular hypertrophy since last tracing no significant change Confirmed by Daleen Bo [phone]) on 01/24/2018 10:37:06 PM   Radiology No results found.  Procedures Procedures (including critical care time)  Medications Ordered in ED Medications  potassium chloride SA (K-DUR,KLOR-CON) CR tablet 20 mEq (has no administration in time range)  hydrALAZINE (APRESOLINE) injection 5 mg (5 mg Intravenous Given 01/24/18 2230)  hydrALAZINE (APRESOLINE) injection 10 mg (10 mg Intravenous Given 01/24/18 2343)  hydrochlorothiazide (MICROZIDE) capsule 12.5 mg (12.5 mg Oral Given 01/24/18 2344)     Initial Impression / Assessment and Plan / ED Course  I have reviewed the triage vital signs and the nursing notes.  Pertinent labs & imaging results that were available during my care of the patient were reviewed by me and considered in my medical decision  making (see chart for details).  Clinical Course as of Jan 24 2357  Wed Jan 24, 2018  2316 Reassessed. BP not improved. Will order more hydralazine. Consulted with Dr. Eulis Foster, attending physician.    [AM]    Clinical Course User Index [AM] Albesa Seen, PA-C    Patient without high-risk features of headache including: sudden onset/thunderclap HA, no similar headache in past, altered mental status, accompanying seizure, headache with exertion,  history of immunocompromise, neck or shoulder pain, fever, use of anticoagulation, family history of spontaneous SAH.  Patient does have significant elevated blood pressure the emergency department, but feel that this is of separate etiology from her headache.  She reports that her headache is unchanged and consistent with all of her prior headaches and has been persistent since she was evaluated 2 days ago with negative head CT.  Patient has a normal complete neurological exam, normal vital signs, normal level of consciousness, no signs of meningismus, is well-appearing/non-toxic appearing, no signs of trauma. No pain over the temporal arteries.  No left leg weakness is appreciated on exam.  Patient does not have any chest pain, shortness of breath or other signs of hypertensive urgency or emergency.  She has normal troponin, no elevation in creatinine, and unchanged EKG.  Do not suspect acute cardiopulmonary abnormality.  Imaging with CT/MRI not indicated given history and physical exam findings.  Discussed with Dr. Eulis Foster who evaluated and agrees.   Extensive chart review performed of patient's previous antihypertensives, and it appears that it is unclear why her hydrochlorothiazide was discontinued.  She was not intolerant to it.  Patient given hydralazine here in the emergency department to slightly lower blood pressure, and will reinitiate hydrochlorothiazide half dose to initiate.  Patient was started on propanolol low-dose 2 days ago.  Will  continue, this will have minimal effect on blood pressure, but may improve migraine prophylaxis.  Patient referred to neurology, and refer back to her cardiologist for recheck of blood pressure and further antihypertensive management.  Patient dependently evaluated by attending physician, Dr. Daleen Bo, who assisted in disposition management.   Final Clinical Impressions(s) / ED Diagnoses   Final diagnoses:  Elevated blood pressure reading with diagnosis of hypertension  Left-sided headache    ED Discharge Orders         Ordered    Ambulatory referral to Neurology    Comments:  An appointment is requested in approximately: 4 weeks. Would like to establish care with new provider.   01/25/18 0012    hydrochlorothiazide (HYDRODIURIL) 12.5 MG tablet  Daily     01/25/18 0013    propranolol (INDERAL) 10 MG tablet  2 times daily     01/25/18 0013           Albesa Seen, PA-C 01/25/18 0016    Daleen Bo, MD 01/27/18 1734

## 2018-01-25 ENCOUNTER — Other Ambulatory Visit: Payer: Self-pay | Admitting: Cardiovascular Disease

## 2018-01-25 ENCOUNTER — Other Ambulatory Visit: Payer: Self-pay | Admitting: Internal Medicine

## 2018-01-25 MED ORDER — HYDROCHLOROTHIAZIDE 12.5 MG PO TABS: 12.5000 mg | ORAL_TABLET | Freq: Every day | ORAL | 0 refills | Status: DC

## 2018-01-25 MED ORDER — PROPRANOLOL HCL 10 MG PO TABS: 10.0000 mg | ORAL_TABLET | Freq: Two times a day (BID) | ORAL | 0 refills | Status: DC

## 2018-01-25 NOTE — Discharge Instructions (Signed)
Please see the information and instructions below regarding your visit.  Your diagnoses today include:  1. Elevated blood pressure reading with diagnosis of hypertension   2. Left-sided headache     You were seen and treated in the emergency department today for headache. Fortunately, your vitals, exam, and work-up is reassuring with no apparent emergent cause for your headache at this time.  Tests performed today include: See side panel of your discharge paperwork for testing performed today. Vital signs are listed at the bottom of these instructions.   Medications prescribed:    Try to avoid daily or regular use of tylenol, aspirin, ibuprofen, and other overt-the-counter pain medications as this can contribute to rebound headaches.   Take any prescribed medications only as prescribed, and any over the counter medications only as directed on the packaging.  Please start hydrochlorothiazide 12.5 mg daily.  He will need a recheck with Dr. Gwenlyn Found within 2 weeks.  You need to get your electrolytes rechecked.  You may start the propanolol twice daily for migraine prophylaxis.   Home care instructions:   Drink plenty of fluids at home. This will help with your headache. Be cautious with caffeine use, as this can cause your headache to rebound when the effects wear off. If you drink more than 2 cups of coffee/caffeinated tea, or caffeinated soda per day, I suggest you wean down that amount.  Please follow any educational materials contained in this packet.   Follow-up instructions: Please follow up with Dr. Gwenlyn Found as soon as possible. I sent him an email.   Return instructions:  Please return to the Emergency Department if you experience worsening symptoms. It is VERY important that you monitor your symptoms at home. If you develop worsening headache, new fever, new neck stiffness, rash, focal weakness or numbness, or any other new or concerning symptoms, please return to the ED immediately,  as these may be signs that your headache has become a potentially serious and life-threatening condition.  Please return if you have any other emergent concerns.  Additional Information:   Your vital signs today were: BP (!) 183/105    Pulse 78    Temp 98.4 F (36.9 C) (Oral)    Resp 15    Ht 5' 5.25" (1.657 m)    Wt 100.8 kg    SpO2 98%    BMI 36.69 kg/m  If your blood pressure (BP) was elevated on multiple readings during this visit above 130 for the top number or above 80 for the bottom number, please have this repeated by your primary care provider within one month. --------------  Thank you for allowing [place] to participate in your care today.

## 2018-01-26 ENCOUNTER — Telehealth: Payer: Self-pay

## 2018-01-26 NOTE — Telephone Encounter (Addendum)
Contacted pt for reminder of upcoming appt scheduled 02/21/2018. Pt verbalized understanding  ----- Message from Lorretta Harp, MD sent at 01/25/2018  8:27 AM EST ----- Regarding: RE: ED Patient Follow Up Chi, can you have the patient follow-up with me in the next 2 to 3 weeks please?  JJB ----- Message ----- From: Tamala Julian Sent: 01/25/2018  12:17 AM EST To: Lorretta Harp, MD Subject: ED Patient Follow Up                           Hello Dr. Gwenlyn Found,  I evaluated the above patient in [facility] long emergency department on 01-24-2018.  She is previously followed by you for uncontrolled hypertension.  It appears her primary care provider discontinued hydrochlorothiazide in November 2019, neither patient nor the chart indicate why.  She has been seen for uncontrolled hypertension twice this week.  My attending physician I restarted her on this.  She is on Irbesartan and hydralazine as well. We wondering if she can follow closely with you. Thank you for caring for mutual patients.   Best regards,  Alyssa B. Valere Dross, PA-C

## 2018-01-30 ENCOUNTER — Encounter: Payer: Self-pay | Admitting: Internal Medicine

## 2018-01-30 ENCOUNTER — Ambulatory Visit (INDEPENDENT_AMBULATORY_CARE_PROVIDER_SITE_OTHER): Payer: Medicare Other | Admitting: Internal Medicine

## 2018-01-30 VITALS — BP 148/90 | HR 78 | Temp 97.9°F | Wt 215.0 lb

## 2018-01-30 DIAGNOSIS — T502X5A Adverse effect of carbonic-anhydrase inhibitors, benzothiadiazides and other diuretics, initial encounter: Secondary | ICD-10-CM | POA: Diagnosis not present

## 2018-01-30 DIAGNOSIS — E119 Type 2 diabetes mellitus without complications: Secondary | ICD-10-CM | POA: Diagnosis not present

## 2018-01-30 DIAGNOSIS — E876 Hypokalemia: Secondary | ICD-10-CM

## 2018-01-30 DIAGNOSIS — I10 Essential (primary) hypertension: Secondary | ICD-10-CM | POA: Diagnosis not present

## 2018-01-30 LAB — BASIC METABOLIC PANEL
BUN: 20 mg/dL (ref 6–23)
CO2: 29 mEq/L (ref 19–32)
Calcium: 9.8 mg/dL (ref 8.4–10.5)
Chloride: 104 mEq/L (ref 96–112)
Creatinine, Ser: 1.18 mg/dL (ref 0.40–1.20)
GFR: 59.17 mL/min — ABNORMAL LOW (ref >60.00)
Glucose, Bld: 116 mg/dL — ABNORMAL HIGH (ref 70–99)
Potassium: 4.1 mEq/L (ref 3.5–5.1)
Sodium: 140 mEq/L (ref 135–145)

## 2018-01-30 NOTE — Patient Instructions (Signed)

## 2018-01-31 ENCOUNTER — Encounter: Payer: Self-pay | Admitting: Internal Medicine

## 2018-01-31 NOTE — Progress Notes (Signed)
Subjective:    Patient ID: Carrie Singh, female    DOB: [birth year], 63 y.o.   MRN: [id number]  HPI  Clinic today for multiple ER follow-up.  She went to the ER 1/6 with complaints of elevated blood pressure.  Labs were unremarkable.  EKG was unremarkable.  CT scan of head did not show any acute findings.  They added propanolol 10 mg twice daily, in addition to her Irbesartan and Hydralazine.  She was discharged and advised to follow-up with cardiology and her PCP.  She went back to the ER on 1/8, with complaints of elevated blood pressure labs were unremarkable.  EKG was unchanged.  She was started on HCTZ in addition to Propanolol, Irbesartan and Hydralazine.  She was advised to follow-up with PCP and cardiology.  She reports she has a cardiology appointment on 2/5.  Her BP today is 148/90.  She has been having associated headaches, but reports this is improved now.  Renal artery ultrasound and echocardiogram from 10/2014 reviewed.  Review of Systems      Past Medical History:  Diagnosis Date  . Breast cancer ([place])   . Breast cancer, right breast ([place]) 12/2007   a. s/p chemo, radiation, and lumpectomy with negative sentinel lymph node biopsy   . Depression   . Diabetes mellitus without complication ([place])   . GERD (gastroesophageal reflux disease)   . Hyperlipidemia   . Hypertension    resistant, negative renal Dopplers and negative CT angiogram  . Migraines   . Obesity   . Seizures ([place])    last seizure  1 week ago: states she jumps for a second and it's gone    Current Outpatient Medications  Medication Sig Dispense Refill  . anastrozole (ARIMIDEX) 1 MG tablet Take 1 tablet (1 mg total) by mouth daily. 90 tablet 3  . Aspirin-Acetaminophen-Caffeine (GOODY HEADACHE PO) Take 1 packet by mouth as needed (for migraines).     . Biotin w/ Vitamins C & E (HAIR SKIN & NAILS GUMMIES PO) Take 1 tablet by mouth daily.     Marland Kitchen glipiZIDE (GLUCOTROL) 5 MG tablet TAKE 1 TABLET BY MOUTH  DAILY  BEFORE BREAKFAST 90 tablet 0  . hydrALAZINE (APRESOLINE) 100 MG tablet Take 1 tablet (100 mg total) by mouth 3 (three) times daily. 270 tablet 3  . hydrochlorothiazide (HYDRODIURIL) 12.5 MG tablet Take 1 tablet (12.5 mg total) by mouth daily. 14 tablet 0  . irbesartan (AVAPRO) 300 MG tablet TAKE 1 TABLET BY MOUTH  DAILY 90 tablet 0  . Multiple Vitamins-Minerals (ONE-A-DAY WOMENS VITACRAVES) CHEW Chew 1 tablet by mouth daily.     . pravastatin (PRAVACHOL) 40 MG tablet TAKE 1 TABLET BY MOUTH  DAILY (Patient taking differently: Take 40 mg by mouth daily. ) 90 tablet 1  . propranolol (INDERAL) 10 MG tablet Take 1 tablet (10 mg total) by mouth 2 (two) times daily. 30 tablet 0   No current facility-administered medications for this visit.     No Known Allergies  Family History  Problem Relation Age of Onset  . Stroke Mother   . Heart disease Father   . Asthma Father   . Cancer Father        colon  . Cancer Sister        lung  . Cancer Brother        colon  . Breast cancer Other 48  . Diabetes Neg Hx     Social History   Socioeconomic History  . Marital  status: Married    Spouse name: [name]  . Number of children: Not on file  . Years of education: 12+  . Highest education level: Not on file  Occupational History  . Occupation: [employer]  Social Needs  . Financial resource strain: Not on file  . Food insecurity:    Worry: Not on file    Inability: Not on file  . Transportation needs:    Medical: Not on file    Non-medical: Not on file  Tobacco Use  . Smoking status: Never Smoker  . Smokeless tobacco: Never Used  Substance and Sexual Activity  . Alcohol use: No    Alcohol/week: 0.0 standard drinks  . Drug use: No  . Sexual activity: Yes  Lifestyle  . Physical activity:    Days per week: Not on file    Minutes per session: Not on file  . Stress: Not on file  Relationships  . Social connections:    Talks on phone: Not on file    Gets together: Not on file    Attends  religious service: Not on file    Active member of club or organization: Not on file    Attends meetings of clubs or organizations: Not on file    Relationship status: Not on file  . Intimate partner violence:    Fear of current or ex partner: Not on file    Emotionally abused: Not on file    Physically abused: Not on file    Forced sexual activity: Not on file  Other Topics Concern  . Not on file  Social History Narrative   Lives at home with husband.   Caffeine use: 1-2 drinks per month         Epworth Sleepiness Scale = 15 (as of 01/01/15)     Constitutional: Patient reports intermittent headaches.  Denies fever, malaise, fatigue, or abrupt weight changes.  Respiratory: Denies difficulty breathing, shortness of breath, cough or sputum production.   Cardiovascular: Denies chest pain, chest tightness, palpitations or swelling in the hands or feet.  Neurological: Denies dizziness, difficulty with memory, difficulty with speech or problems with balance and coordination.    No other specific complaints in a complete review of systems (except as listed in HPI above).  Objective:   Physical Exam  BP (!) 148/90   Pulse 78   Temp 97.9 F (36.6 C) (Oral)   Wt 215 lb (97.5 kg)   SpO2 99%   BMI 35.50 kg/m  Wt Readings from Last 3 Encounters:  01/30/18 215 lb (97.5 kg)  01/24/18 222 lb 3 oz (100.8 kg)  11/27/17 219 lb (99.3 kg)    General: Appears her stated age, obese, in NAD. Cardiovascular: Normal rate and rhythm. S1,S2 noted.  No murmur, rubs or gallops noted.  Trace BLE edema.  Pulmonary/Chest: Normal effort and positive vesicular breath sounds. No respiratory distress. No wheezes, rales or ronchi noted.  Neurological: Alert and oriented.  She seems to have a mild processing delay, which she has had in the past.   BMET    Component Value Date/Time   NA 140 01/30/2018 1208   NA 146 (H) 01/20/2015 1457   NA 143 10/12/2013 1510   K 4.1 01/30/2018 1208   K 3.9  10/12/2013 1510   CL 104 01/30/2018 1208   CL 109 (H) 10/12/2013 1510   CO2 29 01/30/2018 1208   CO2 29 10/12/2013 1510   GLUCOSE 116 (H) 01/30/2018 1208   GLUCOSE 92 10/12/2013 1510  BUN 20 01/30/2018 1208   BUN 17 01/20/2015 1457   BUN 16 10/12/2013 1510   CREATININE 1.18 01/30/2018 1208   CREATININE 1.15 (H) 10/12/2015 1603   CALCIUM 9.8 01/30/2018 1208   CALCIUM 9.4 10/12/2013 1510   GFRNONAA >60 01/24/2018 2159   GFRNONAA 56 (L) 10/12/2013 1510   GFRAA >60 01/24/2018 2159   GFRAA >60 10/12/2013 1510    Lipid Panel     Component Value Date/Time   CHOL 173 11/27/2017 1558   CHOL 179 10/17/2014 1527   TRIG 79.0 11/27/2017 1558   HDL 66.60 11/27/2017 1558   HDL 59 10/17/2014 1527   CHOLHDL 3 11/27/2017 1558   VLDL 15.8 11/27/2017 1558   LDLCALC 90 11/27/2017 1558   LDLCALC 91 10/17/2014 1527    CBC    Component Value Date/Time   WBC 5.3 01/24/2018 2159   RBC 4.08 01/24/2018 2159   HGB 11.7 (L) 01/24/2018 2159   HGB 13.5 10/12/2013 1510   HGB 12.8 08/13/2009 0946   HCT 36.8 01/24/2018 2159   HCT 40.5 10/12/2013 1510   HCT 36.8 08/13/2009 0946   PLT 230 01/24/2018 2159   PLT 278 10/12/2013 1510   PLT 271 08/13/2009 0946   MCV 90.2 01/24/2018 2159   MCV 87 10/12/2013 1510   MCV 87.0 08/13/2009 0946   MCH 28.7 01/24/2018 2159   MCHC 31.8 01/24/2018 2159   RDW 13.2 01/24/2018 2159   RDW 13.6 10/12/2013 1510   RDW 13.8 08/13/2009 0946   LYMPHSABS 2.1 01/24/2018 2159   LYMPHSABS 1.5 08/13/2009 0946   MONOABS 0.3 01/24/2018 2159   MONOABS 0.2 08/13/2009 0946   EOSABS 0.1 01/24/2018 2159   EOSABS 0.1 08/13/2009 0946   BASOSABS 0.1 01/24/2018 2159   BASOSABS 0.0 08/13/2009 0946    Hgb A1C Lab Results  Component Value Date   HGBA1C 6.6 (H) 01/01/2018            Assessment & Plan:   ER follow-up for Hypertension:  ER notes, labs reviewed Blood pressure improved today, but still not at goal Will not add additional medication at this time as  they have recently added to new medications Continue HCTZ, propanolol, he irbesartan and hydralazine for now Bmet today to assess kidney function and potassium levels Follow-up with cardiology as previously scheduled  We will follow-up after labs, return precautions discussed Webb Silversmith, NP

## 2018-02-07 ENCOUNTER — Encounter: Payer: Self-pay | Admitting: Internal Medicine

## 2018-02-07 MED ORDER — HYDROCHLOROTHIAZIDE 12.5 MG PO TABS: 12.5000 mg | ORAL_TABLET | Freq: Every day | ORAL | 1 refills | Status: DC

## 2018-02-07 MED ORDER — PROPRANOLOL HCL 10 MG PO TABS: 10.0000 mg | ORAL_TABLET | Freq: Two times a day (BID) | ORAL | 1 refills | Status: DC

## 2018-02-21 ENCOUNTER — Encounter: Payer: Self-pay | Admitting: Cardiovascular Disease

## 2018-02-21 ENCOUNTER — Ambulatory Visit: Payer: Medicare Other | Admitting: Cardiovascular Disease

## 2018-02-21 DIAGNOSIS — I10 Essential (primary) hypertension: Secondary | ICD-10-CM

## 2018-02-21 DIAGNOSIS — E782 Mixed hyperlipidemia: Secondary | ICD-10-CM | POA: Diagnosis not present

## 2018-02-21 MED ORDER — NEBIVOLOL HCL 5 MG PO TABS: 5.0000 mg | ORAL_TABLET | Freq: Every day | ORAL | 3 refills | Status: DC

## 2018-02-21 NOTE — Assessment & Plan Note (Signed)
History of hyperlipidemia on statin therapy with recent lipid profile performed 11/27/2017 revealing total cholesterol 173, LDL of 90 and HDL of 66.

## 2018-02-21 NOTE — Progress Notes (Signed)
02/21/2018 Carrie Singh   1955-06-20  [id number]  Primary Physician Jearld Fenton, NP Primary Cardiologist: Lorretta Harp MD Lupe Carney, [place]  HPI:  Carrie Singh is a 63 y.o.  moderately overweight married African-American female no children is accompanied today by her husband [name]. Unless her in the office  05/18/2016. She has a history of treated hypertension, diabetes and hyperlipidemia. She has never had a heart attack or stroke. She has occasional chest pain but has had a negative Myoview stress test in the last several months. She is has had difficult to control hypertension for the last 4-6 months and isn't on multiple antihypertensive medications. Renal Dopplers performed 11/13/14 were unrevealing as was a CT angiogram performed 11/28/14.over the last several months we have been titrating her blood pressure medicines she is seen [name] as an outpatient. Her blood pressure is much better controlled recently and she does feel clinically improved . She was diagnosed with breast cancer and had a mastectomy 03/18/16 and was doing well.  As I saw her back a year and a half ago she has had recurrence of her resistant hypertension and was recently seen in the emergency room 01/24/2018 with headache and hypertension.  Her medicines were adjusted.  She was put on propranolol.  She does admit to dietary indiscretion with regards to salt.   Current Meds  Medication Sig  . anastrozole (ARIMIDEX) 1 MG tablet Take 1 tablet (1 mg total) by mouth daily.  . Aspirin-Acetaminophen-Caffeine (GOODY HEADACHE PO) Take 1 packet by mouth as needed (for migraines).   . Biotin w/ Vitamins C & E (HAIR SKIN & NAILS GUMMIES PO) Take 1 tablet by mouth daily.   Marland Kitchen glipiZIDE (GLUCOTROL) 5 MG tablet TAKE 1 TABLET BY MOUTH  DAILY BEFORE BREAKFAST  . hydrALAZINE (APRESOLINE) 100 MG tablet Take 1 tablet (100 mg total) by mouth 3 (three) times daily.  . hydrochlorothiazide (HYDRODIURIL) 12.5 MG tablet Take 1  tablet (12.5 mg total) by mouth daily.  . irbesartan (AVAPRO) 300 MG tablet TAKE 1 TABLET BY MOUTH  DAILY  . Multiple Vitamins-Minerals (ONE-A-DAY WOMENS VITACRAVES) CHEW Chew 1 tablet by mouth daily.   . pravastatin (PRAVACHOL) 40 MG tablet TAKE 1 TABLET BY MOUTH  DAILY (Patient taking differently: Take 40 mg by mouth daily. )  . propranolol (INDERAL) 10 MG tablet Take 1 tablet (10 mg total) by mouth 2 (two) times daily.     No Known Allergies  Social History   Socioeconomic History  . Marital status: Married    Spouse name: [name]  . Number of children: Not on file  . Years of education: 12+  . Highest education level: Not on file  Occupational History  . Occupation: [employer]  Social Needs  . Financial resource strain: Not on file  . Food insecurity:    Worry: Not on file    Inability: Not on file  . Transportation needs:    Medical: Not on file    Non-medical: Not on file  Tobacco Use  . Smoking status: Never Smoker  . Smokeless tobacco: Never Used  Substance and Sexual Activity  . Alcohol use: No    Alcohol/week: 0.0 standard drinks  . Drug use: No  . Sexual activity: Yes  Lifestyle  . Physical activity:    Days per week: Not on file    Minutes per session: Not on file  . Stress: Not on file  Relationships  . Social connections:    Talks on  phone: Not on file    Gets together: Not on file    Attends religious service: Not on file    Active member of club or organization: Not on file    Attends meetings of clubs or organizations: Not on file    Relationship status: Not on file  . Intimate partner violence:    Fear of current or ex partner: Not on file    Emotionally abused: Not on file    Physically abused: Not on file    Forced sexual activity: Not on file  Other Topics Concern  . Not on file  Social History Narrative   Lives at home with husband.   Caffeine use: 1-2 drinks per month         Epworth Sleepiness Scale = 15 (as of 01/01/15)     Review of  Systems: General: negative for chills, fever, night sweats or weight changes.  Cardiovascular: negative for chest pain, dyspnea on exertion, edema, orthopnea, palpitations, paroxysmal nocturnal dyspnea or shortness of breath Dermatological: negative for rash Respiratory: negative for cough or wheezing Urologic: negative for hematuria Abdominal: negative for nausea, vomiting, diarrhea, bright red blood per rectum, melena, or hematemesis Neurologic: negative for visual changes, syncope, or dizziness All other systems reviewed and are otherwise negative except as noted above.    Blood pressure (!) 134/92, pulse 74, height 5' 5.25" (1.657 m), weight 218 lb (98.9 kg), SpO2 98 %.  General appearance: alert and no distress Neck: no adenopathy, no carotid bruit, no JVD, supple, symmetrical, trachea midline and thyroid not enlarged, symmetric, no tenderness/mass/nodules Lungs: clear to auscultation bilaterally Heart: regular rate and rhythm, S1, S2 normal, no murmur, click, rub or gallop Extremities: extremities normal, atraumatic, no cyanosis or edema Pulses: 2+ and symmetric Skin: Skin color, texture, turgor normal. No rashes or lesions Neurologically intact  EKG not performed today  ASSESSMENT AND PLAN:   HTN (hypertension) Ms. Billick has a history of resistant hypertension.  She was recently seen in the ER with headache and high blood pressure.  Medicines were adjusted.  She is had a negative renal Doppler study performed 2016.  Blood pressure today is 134/92 although her blood pressure readings at home are higher than this.  She does admit to dietary indiscretion with regards to salt.  She is on hydralazine, hydrochlorothiazide, Avapro and propranolol.  We talked both importance of avoidance.  She will keep a blood pressure log for the next 4 weeks and see Kristen back in the office at which time her medicines will be appropriately titrated.  HLD (hyperlipidemia) History of hyperlipidemia on  statin therapy with recent lipid profile performed 11/27/2017 revealing total cholesterol 173, LDL of 90 and HDL of 66.      Lorretta Harp MD [facility], [facility] 02/21/2018 11:34 AM

## 2018-02-21 NOTE — Addendum Note (Signed)
Addended by: Annita Brod on: 02/21/2018 11:56 AM   Modules accepted: Orders

## 2018-02-21 NOTE — Patient Instructions (Signed)
Medication Instructions:  STOP TAKING PROPRANOLOL  START BYSTOLIC 5 MG, ONE TABLET BY MOUTH DAILY If you need a refill on your cardiac medications before your next appointment, please call your pharmacy.   Lab work: NONE If you have labs (blood work) drawn today and your tests are completely normal, you will receive your results only by: Marland Kitchen MyChart Message (if you have MyChart) OR . A paper copy in the mail If you have any lab test that is abnormal or we need to change your treatment, we will call you to review the results.  Testing/Procedures: NONE  Follow-Up: At [facility], you and your health needs are our priority.  As part of our continuing mission to provide you with exceptional heart care, we have created designated Provider Care Teams.  These Care Teams include your primary Cardiologist (physician) and Advanced Practice Providers (APPs -  Physician Assistants and Nurse Practitioners) who all work together to provide you with the care you need, when you need it. . You will need a follow up appointment in 3 months.  You may see Dr. Gwenlyn Found or one of the following Advanced Practice Providers on your designated Care Team:   . Kerin Ransom, [place] . Almyra Deforest, PA-C . Fabian Sharp, PA-C . Jory Sims, DNP . Rosaria Ferries, PA-C . Roby Lofts, PA-C . Sande Rives, PA-C  Any Other Special Instructions Will Be Listed Below (If Applicable). KEEP A 30-DAY DAILY BLOOD PRESSURE LOG AND THEN FOLLOW UP WITH HEARTCARE CLINICAL PHARMACIST.    DASH Eating Plan DASH stands for "Dietary Approaches to Stop Hypertension." The DASH eating plan is a healthy eating plan that has been shown to reduce high blood pressure (hypertension). It may also reduce your risk for type 2 diabetes, heart disease, and stroke. The DASH eating plan may also help with weight loss. What are tips for following this plan?  General guidelines  Avoid eating more than 2,300 mg (milligrams) of salt (sodium) a day.  If you have hypertension, you may need to reduce your sodium intake to 1,500 mg a day.  Limit alcohol intake to no more than 1 drink a day for nonpregnant women and 2 drinks a day for men. One drink equals 12 oz of beer, 5 oz of wine, or 1 oz of hard liquor.  Work with your health care provider to maintain a healthy body weight or to lose weight. Ask what an ideal weight is for you.  Get at least 30 minutes of exercise that causes your heart to beat faster (aerobic exercise) most days of the week. Activities may include walking, swimming, or biking.  Work with your health care provider or diet and nutrition specialist (dietitian) to adjust your eating plan to your individual calorie needs. Reading food labels   Check food labels for the amount of sodium per serving. Choose foods with less than 5 percent of the Daily Value of sodium. Generally, foods with less than 300 mg of sodium per serving fit into this eating plan.  To find whole grains, look for the word "whole" as the first word in the ingredient list. Shopping  Buy products labeled as "low-sodium" or "no salt added."  Buy fresh foods. Avoid canned foods and premade or frozen meals. Cooking  Avoid adding salt when cooking. Use salt-free seasonings or herbs instead of table salt or sea salt. Check with your health care provider or pharmacist before using salt substitutes.  Do not fry foods. Cook foods using healthy methods such as  baking, boiling, grilling, and broiling instead.  Cook with heart-healthy oils, such as olive, canola, soybean, or sunflower oil. Meal planning  Eat a balanced diet that includes: ? 5 or more servings of fruits and vegetables each day. At each meal, try to fill half of your plate with fruits and vegetables. ? Up to 6-8 servings of whole grains each day. ? Less than 6 oz of lean meat, poultry, or fish each day. A 3-oz serving of meat is about the same size as a deck of cards. One egg equals 1 oz. ? 2  servings of low-fat dairy each day. ? A serving of nuts, seeds, or beans 5 times each week. ? Heart-healthy fats. Healthy fats called Omega-3 fatty acids are found in foods such as flaxseeds and coldwater fish, like sardines, salmon, and mackerel.  Limit how much you eat of the following: ? Canned or prepackaged foods. ? Food that is high in trans fat, such as fried foods. ? Food that is high in saturated fat, such as fatty meat. ? Sweets, desserts, sugary drinks, and other foods with added sugar. ? Full-fat dairy products.  Do not salt foods before eating.  Try to eat at least 2 vegetarian meals each week.  Eat more home-cooked food and less restaurant, buffet, and fast food.  When eating at a restaurant, ask that your food be prepared with less salt or no salt, if possible. What foods are recommended? The items listed may not be a complete list. Talk with your dietitian about what dietary choices are best for you. Grains Whole-grain or whole-wheat bread. Whole-grain or whole-wheat pasta. Brown rice. Modena Morrow. Bulgur. Whole-grain and low-sodium cereals. Pita bread. Low-fat, low-sodium crackers. Whole-wheat flour tortillas. Vegetables Fresh or frozen vegetables (raw, steamed, roasted, or grilled). Low-sodium or reduced-sodium tomato and vegetable juice. Low-sodium or reduced-sodium tomato sauce and tomato paste. Low-sodium or reduced-sodium canned vegetables. Fruits All fresh, dried, or frozen fruit. Canned fruit in natural juice (without added sugar). Meat and other protein foods Skinless chicken or [place]. Ground chicken or [place]. Pork with fat trimmed off. Fish and seafood. Egg whites. Dried beans, peas, or lentils. Unsalted nuts, nut butters, and seeds. Unsalted canned beans. Lean cuts of beef with fat trimmed off. Low-sodium, lean deli meat. Dairy Low-fat (1%) or fat-free (skim) milk. Fat-free, low-fat, or reduced-fat cheeses. Nonfat, low-sodium ricotta or cottage cheese.  Low-fat or nonfat yogurt. Low-fat, low-sodium cheese. Fats and oils Soft margarine without trans fats. Vegetable oil. Low-fat, reduced-fat, or light mayonnaise and salad dressings (reduced-sodium). Canola, safflower, olive, soybean, and sunflower oils. Avocado. Seasoning and other foods Herbs. Spices. Seasoning mixes without salt. Unsalted popcorn and pretzels. Fat-free sweets. What foods are not recommended? The items listed may not be a complete list. Talk with your dietitian about what dietary choices are best for you. Grains Baked goods made with fat, such as croissants, muffins, or some breads. Dry pasta or rice meal packs. Vegetables Creamed or fried vegetables. Vegetables in a cheese sauce. Regular canned vegetables (not low-sodium or reduced-sodium). Regular canned tomato sauce and paste (not low-sodium or reduced-sodium). Regular tomato and vegetable juice (not low-sodium or reduced-sodium). Angie Fava. Olives. Fruits Canned fruit in a light or heavy syrup. Fried fruit. Fruit in cream or butter sauce. Meat and other protein foods Fatty cuts of meat. Ribs. Fried meat. Berniece Salines. Sausage. Bologna and other processed lunch meats. Salami. Fatback. Hotdogs. Bratwurst. Salted nuts and seeds. Canned beans with added salt. Canned or smoked fish. Whole eggs or egg yolks.  Chicken or [place] with skin. Dairy Whole or 2% milk, cream, and half-and-half. Whole or full-fat cream cheese. Whole-fat or sweetened yogurt. Full-fat cheese. Nondairy creamers. Whipped toppings. Processed cheese and cheese spreads. Fats and oils Butter. Stick margarine. Lard. Shortening. Ghee. Bacon fat. Tropical oils, such as coconut, palm kernel, or palm oil. Seasoning and other foods Salted popcorn and pretzels. Onion salt, garlic salt, seasoned salt, table salt, and sea salt. Worcestershire sauce. Tartar sauce. Barbecue sauce. Teriyaki sauce. Soy sauce, including reduced-sodium. Steak sauce. Canned and packaged gravies. Fish sauce.  Oyster sauce. Cocktail sauce. Horseradish that you find on the shelf. Ketchup. Mustard. Meat flavorings and tenderizers. Bouillon cubes. Hot sauce and Tabasco sauce. Premade or packaged marinades. Premade or packaged taco seasonings. Relishes. Regular salad dressings. Where to find more information:  National Heart, Lung, and [place]: https://wilson-eaton.com/  American Heart Association: www.heart.org Summary  The DASH eating plan is a healthy eating plan that has been shown to reduce high blood pressure (hypertension). It may also reduce your risk for type 2 diabetes, heart disease, and stroke.  With the DASH eating plan, you should limit salt (sodium) intake to 2,300 mg a day. If you have hypertension, you may need to reduce your sodium intake to 1,500 mg a day.  When on the DASH eating plan, aim to eat more fresh fruits and vegetables, whole grains, lean proteins, low-fat dairy, and heart-healthy fats.  Work with your health care provider or diet and nutrition specialist (dietitian) to adjust your eating plan to your individual calorie needs. This information is not intended to replace advice given to you by your health care provider. Make sure you discuss any questions you have with your health care provider. Document Released: 12/23/2010 Document Revised: 12/28/2015 Document Reviewed: 12/28/2015 Elsevier Interactive Patient Education  2019 [employer].

## 2018-02-21 NOTE — Assessment & Plan Note (Signed)
Ms. Graybeal has a history of resistant hypertension.  She was recently seen in the ER with headache and high blood pressure.  Medicines were adjusted.  She is had a negative renal Doppler study performed 2016.  Blood pressure today is 134/92 although her blood pressure readings at home are higher than this.  She does admit to dietary indiscretion with regards to salt.  She is on hydralazine, hydrochlorothiazide, Avapro and propranolol.  We talked both importance of avoidance.  She will keep a blood pressure log for the next 4 weeks and see Kristen back in the office at which time her medicines will be appropriately titrated.

## 2018-02-22 ENCOUNTER — Encounter: Payer: Self-pay | Admitting: Internal Medicine

## 2018-02-28 NOTE — Addendum Note (Signed)
Addended by: Lurlean Nanny on: 02/28/2018 02:07 PM   Modules accepted: Orders

## 2018-03-01 ENCOUNTER — Other Ambulatory Visit: Payer: Self-pay | Admitting: Internal Medicine

## 2018-03-05 ENCOUNTER — Other Ambulatory Visit: Payer: Medicare Other

## 2018-03-05 ENCOUNTER — Other Ambulatory Visit (INDEPENDENT_AMBULATORY_CARE_PROVIDER_SITE_OTHER): Payer: Medicare Other

## 2018-03-05 DIAGNOSIS — E119 Type 2 diabetes mellitus without complications: Secondary | ICD-10-CM

## 2018-03-05 LAB — HEMOGLOBIN A1C: Hgb A1c MFr Bld: 6.7 % — ABNORMAL HIGH (ref 4.6–6.5)

## 2018-03-05 NOTE — Telephone Encounter (Signed)
I do not see Propanolol on current med list...Marland Kitchen please advise

## 2018-03-05 NOTE — Telephone Encounter (Signed)
She should not be on Propanolol. She is on Nevibolol. HCTZ refilled.

## 2018-03-06 ENCOUNTER — Telehealth: Payer: Self-pay

## 2018-03-06 ENCOUNTER — Other Ambulatory Visit: Payer: Self-pay

## 2018-03-06 MED ORDER — ATENOLOL 25 MG PO TABS: 25.0000 mg | ORAL_TABLET | Freq: Two times a day (BID) | ORAL | 3 refills | Status: DC

## 2018-03-06 NOTE — Telephone Encounter (Signed)
Spoke with pt and informed that Dr. Gwenlyn Found willing to change Bystolic to atenolol 25 mg BID. Pt states that she has already purchased Bystolic and will complete that before starting atenolol. Informed pt that Rx sent to requested pharmacy ([facility]). Advised that appt will be scheduled for OV with HTN clinic pharmD for BP management. Pt agreeable with this and verbalized understanding. Will route to Dr. Gwenlyn Found for [facility]

## 2018-03-08 ENCOUNTER — Telehealth: Payer: Self-pay | Admitting: Cardiovascular Disease

## 2018-03-08 NOTE — Telephone Encounter (Signed)
Left message for patient to call back to schedule appt with pharmacist for hypertension follow up first or second week in March.

## 2018-03-30 ENCOUNTER — Other Ambulatory Visit: Payer: Self-pay

## 2018-03-30 ENCOUNTER — Ambulatory Visit (INDEPENDENT_AMBULATORY_CARE_PROVIDER_SITE_OTHER): Payer: Medicare Other | Admitting: Pharmacist Clinician (PhC)/ Clinical Pharmacy Specialist

## 2018-03-30 DIAGNOSIS — I10 Essential (primary) hypertension: Secondary | ICD-10-CM | POA: Diagnosis not present

## 2018-03-30 NOTE — Progress Notes (Signed)
03/30/2018 [name] 1955/08/04 [id number]   HPI:  Carrie Singh is a 63 y.o. female patient of Dr Gwenlyn Found, with a PMH below who presents today for hypertension clinic evaluation.  In addition to hypertension, her medical history is significant for DM2, hyperlipidemia, GERD and prior breast cancer.   Her hypertension has been difficult to control at times and we have worked with her in CVRR in the past.    She called about a month ago with concerns about continued elevated blood pressure.  She was unhappy with the Bystolic (and cost), so Dr. Gwenlyn Found switched her from Bystolic 5 mg daily to atenolol 25 mg bid.  Since then she notes a slow but steady trend down in her pressure.    Today she reports some problems with equilibrium, especially when first get up in the mornings, but no chest pain, shortness of breath or lower extremity edema.    Blood Pressure Goal:  130/80  Current Medications:  Atenolol 25 mg bid  Hydralazine 100 mg tid  Irbesartan 300 mg qd  Hydrochlorothiazide 12.5 mg  Family Hx: no hypertension in mother, siblings,   Father died from HF at 20  Social Hx: no tobacco or alcohol, only decaf drinks  Diet: has cut out pork except for the occasional BLT, Does not add salt with cooking, admits to "eating out of a bag"; Eats out once or twice weekly, plus church socials (Wed/Sat);  Tries to bake or broil, admits to liking fries from time to time.    Exercise: walking some, nothing formal.    Home BP readings:  No readings with her today.  Mostly 401'U systolic, only one as low as 138, nothing as high as 170.  Diastolic readings mostly in the 90's  Labs:  01/2018:  Na 140, K 4.1, Glu 116, BUN 20, SCr 1.18, GFR 59.2  Wt Readings from Last 3 Encounters:  02/21/18 218 lb (98.9 kg)  01/30/18 215 lb (97.5 kg)  01/24/18 222 lb 3 oz (100.8 kg)   BP Readings from Last 3 Encounters:  03/30/18 122/66  02/21/18 (!) 134/92  01/30/18 (!) 148/90   Pulse Readings from Last 3  Encounters:  03/30/18 72  02/21/18 74  01/30/18 78    Current Outpatient Medications  Medication Sig Dispense Refill  . anastrozole (ARIMIDEX) 1 MG tablet Take 1 tablet (1 mg total) by mouth daily. 90 tablet 3  . Aspirin-Acetaminophen-Caffeine (GOODY HEADACHE PO) Take 1 packet by mouth as needed (for migraines).     Marland Kitchen atenolol (TENORMIN) 25 MG tablet Take 1 tablet (25 mg total) by mouth 2 (two) times daily. 180 tablet 3  . Biotin w/ Vitamins C & E (HAIR SKIN & NAILS GUMMIES PO) Take 1 tablet by mouth daily.     Marland Kitchen glipiZIDE (GLUCOTROL) 5 MG tablet TAKE 1 TABLET BY MOUTH  DAILY BEFORE BREAKFAST 90 tablet 0  . hydrALAZINE (APRESOLINE) 100 MG tablet Take 1 tablet (100 mg total) by mouth 3 (three) times daily. 270 tablet 3  . hydrochlorothiazide (HYDRODIURIL) 12.5 MG tablet TAKE 1 TABLET BY MOUTH EVERY DAY 30 tablet 1  . irbesartan (AVAPRO) 300 MG tablet TAKE 1 TABLET BY MOUTH  DAILY 90 tablet 0  . Multiple Vitamins-Minerals (ONE-A-DAY WOMENS VITACRAVES) CHEW Chew 1 tablet by mouth daily.     . pravastatin (PRAVACHOL) 40 MG tablet TAKE 1 TABLET BY MOUTH  DAILY (Patient taking differently: Take 40 mg by mouth daily. ) 90 tablet 1   No current  facility-administered medications for this visit.     No Known Allergies  Past Medical History:  Diagnosis Date  . Breast cancer ([place])   . Breast cancer, right breast ([place]) 12/2007   a. s/p chemo, radiation, and lumpectomy with negative sentinel lymph node biopsy   . Depression   . Diabetes mellitus without complication ([place])   . GERD (gastroesophageal reflux disease)   . Hyperlipidemia   . Hypertension    resistant, negative renal Dopplers and negative CT angiogram  . Migraines   . Obesity   . Seizures ([place])    last seizure  1 week ago: states she jumps for a second and it's gone    Blood pressure 122/66, pulse 72.  (standing 124/68)  HTN (hypertension) Patient with long history of hypertension, poorly controlled at times.  Today her  pressure looks great at 122/66.  There was no change in her pressure when she stood.  Because of the excellent reading this am, I will not make any changes to her meds.  She has been asked to check her pressure at home twice daily , 3-4 days per week, and bring a list of readings, as well as her cuff, to a follow up in 4 weeks.  Patient notes that when I saw her years ago, she did well when we added chlorthalidone, so may consider switching from hydrochlorothiazide if she tends to stay hypertensive.     Tommy Medal PharmD CPP [facility] [address] [place],  [address] [phone]

## 2018-03-30 NOTE — Patient Instructions (Signed)
Return for a a follow up appointment in 1 month  Your blood pressure today is 122/66  Check your blood pressure at home twice daily 3-4 days per week and keep record of the readings.  Take your BP meds as follows:  Continue with all current medicaiton  Bring all of your meds, your BP cuff and your record of home blood pressures to your next appointment.  Exercise as you're able, try to walk approximately 30 minutes per day.  Keep salt intake to a minimum, especially watch canned and prepared boxed foods.  Eat more fresh fruits and vegetables and fewer canned items.  Avoid eating in fast food restaurants.    HOW TO TAKE YOUR BLOOD PRESSURE: . Rest 5 minutes before taking your blood pressure. .  Don't smoke or drink caffeinated beverages for at least 30 minutes before. . Take your blood pressure before (not after) you eat. . Sit comfortably with your back supported and both feet on the floor (don't cross your legs). . Elevate your arm to heart level on a table or a desk. . Use the proper sized cuff. It should fit smoothly and snugly around your bare upper arm. There should be enough room to slip a fingertip under the cuff. The bottom edge of the cuff should be 1 inch above the crease of the elbow. . Ideally, take 3 measurements at one sitting and record the average.

## 2018-03-30 NOTE — Assessment & Plan Note (Signed)
Patient with long history of hypertension, poorly controlled at times.  Today her pressure looks great at 122/66.  There was no change in her pressure when she stood.  Because of the excellent reading this am, I will not make any changes to her meds.  She has been asked to check her pressure at home twice daily , 3-4 days per week, and bring a list of readings, as well as her cuff, to a follow up in 4 weeks.  Patient notes that when I saw her years ago, she did well when we added chlorthalidone, so may consider switching from hydrochlorothiazide if she tends to stay hypertensive.

## 2018-04-12 ENCOUNTER — Other Ambulatory Visit: Payer: Self-pay | Admitting: Cardiovascular Disease

## 2018-04-20 ENCOUNTER — Other Ambulatory Visit: Payer: Self-pay | Admitting: Internal Medicine

## 2018-04-20 ENCOUNTER — Encounter: Payer: Self-pay | Admitting: Internal Medicine

## 2018-04-23 ENCOUNTER — Encounter: Payer: Self-pay | Admitting: Internal Medicine

## 2018-04-23 ENCOUNTER — Ambulatory Visit (INDEPENDENT_AMBULATORY_CARE_PROVIDER_SITE_OTHER)
Admission: RE | Admit: 2018-04-23 | Discharge: 2018-04-23 | Disposition: A | Discharge status: Home / Self Care | Payer: Medicare Other | Source: Ambulatory Visit | Attending: Internal Medicine | Admitting: Internal Medicine

## 2018-04-23 ENCOUNTER — Ambulatory Visit (INDEPENDENT_AMBULATORY_CARE_PROVIDER_SITE_OTHER): Payer: Medicare Other | Admitting: Internal Medicine

## 2018-04-23 ENCOUNTER — Other Ambulatory Visit: Payer: Self-pay

## 2018-04-23 VITALS — BP 138/84 | HR 72 | Temp 98.2°F | Wt 225.0 lb

## 2018-04-23 DIAGNOSIS — M5442 Lumbago with sciatica, left side: Secondary | ICD-10-CM

## 2018-04-23 DIAGNOSIS — R102 Pelvic and perineal pain: Secondary | ICD-10-CM | POA: Diagnosis not present

## 2018-04-23 DIAGNOSIS — G8929 Other chronic pain: Secondary | ICD-10-CM

## 2018-04-23 DIAGNOSIS — R8290 Unspecified abnormal findings in urine: Secondary | ICD-10-CM

## 2018-04-23 LAB — POC URINALSYSI DIPSTICK (AUTOMATED)
Bilirubin, UA: NEGATIVE
Blood, UA: NEGATIVE
Glucose, UA: NEGATIVE
Ketones, UA: NEGATIVE
Leukocytes, UA: NEGATIVE
Nitrite, UA: NEGATIVE
Protein, UA: NEGATIVE
Spec Grav, UA: >=1.03 — AB (ref 1.010–1.025)
Urobilinogen, UA: 0.2 E.U./dL
pH, UA: 5.5 (ref 5.0–8.0)

## 2018-04-23 MED ORDER — NAPROXEN 500 MG PO TABS: 500.0000 mg | ORAL_TABLET | Freq: Two times a day (BID) | ORAL | 0 refills | Status: DC

## 2018-04-23 MED ORDER — GLIPIZIDE 5 MG PO TABS: ORAL_TABLET | ORAL | 0 refills | Status: DC

## 2018-04-23 MED ORDER — ORPHENADRINE CITRATE ER 100 MG PO TB12: 100.0000 mg | ORAL_TABLET | Freq: Two times a day (BID) | ORAL | 0 refills | Status: DC

## 2018-04-23 NOTE — Telephone Encounter (Signed)
Rx sent through [facility]  

## 2018-04-23 NOTE — Progress Notes (Signed)
Subjective:    Patient ID: Carrie Singh, female    DOB: 11-24-1955, 63 y.o.   MRN: [id number]  HPI  Pt presents to the clinic today with c/o left lower back pain. This started 2-3 months ago but worsened in the last month. She describes the pain as achy but can be sharp and stabbing. The pain radiates into her left lower abdomen and down her left leg into her thigh. The pain is worse after sitting for long periods of time or while walking. Laying down relieves the pain. She denies numbness, tingling or weakness of the LLE. She denies any injury to the area. She denies nausea, vomiting, constipation, diarrhea or blood in her stool. She reports her urine is foul smelling and she has had some frequency but  denies urinary urgency, dysuria, blood in her urine or vaginal complaints. She has taken a laxative with minimal relief. She has also tried [facility] powders with some relief.  Review of Systems      Past Medical History:  Diagnosis Date  . Breast cancer ([place])   . Breast cancer, right breast ([place]) 12/2007   a. s/p chemo, radiation, and lumpectomy with negative sentinel lymph node biopsy   . Depression   . Diabetes mellitus without complication ([place])   . GERD (gastroesophageal reflux disease)   . Hyperlipidemia   . Hypertension    resistant, negative renal Dopplers and negative CT angiogram  . Migraines   . Obesity   . Seizures ([place])    last seizure  1 week ago: states she jumps for a second and it's gone    Current Outpatient Medications  Medication Sig Dispense Refill  . anastrozole (ARIMIDEX) 1 MG tablet Take 1 tablet (1 mg total) by mouth daily. 90 tablet 3  . Aspirin-Acetaminophen-Caffeine (GOODY HEADACHE PO) Take 1 packet by mouth as needed (for migraines).     Marland Kitchen atenolol (TENORMIN) 25 MG tablet Take 1 tablet (25 mg total) by mouth 2 (two) times daily. 180 tablet 3  . Biotin w/ Vitamins C & E (HAIR SKIN & NAILS GUMMIES PO) Take 1 tablet by mouth daily.     Marland Kitchen glipiZIDE (GLUCOTROL)  5 MG tablet TAKE 1 TABLET BY MOUTH  DAILY BEFORE BREAKFAST 90 tablet 0  . hydrALAZINE (APRESOLINE) 100 MG tablet TAKE 1 TABLET BY MOUTH 3  TIMES DAILY 270 tablet 3  . hydrochlorothiazide (HYDRODIURIL) 12.5 MG tablet TAKE 1 TABLET BY MOUTH EVERY DAY 30 tablet 1  . irbesartan (AVAPRO) 300 MG tablet TAKE 1 TABLET BY MOUTH  DAILY 90 tablet 0  . Multiple Vitamins-Minerals (ONE-A-DAY WOMENS VITACRAVES) CHEW Chew 1 tablet by mouth daily.     . pravastatin (PRAVACHOL) 40 MG tablet TAKE 1 TABLET BY MOUTH  DAILY (Patient taking differently: Take 40 mg by mouth daily. ) 90 tablet 1   No current facility-administered medications for this visit.     No Known Allergies  Family History  Problem Relation Age of Onset  . Stroke Mother   . Heart disease Father   . Asthma Father   . Cancer Father        colon  . Cancer Sister        lung  . Cancer Brother        colon  . Breast cancer Other 48  . Diabetes Neg Hx     Social History   Socioeconomic History  . Marital status: Married    Spouse name: [name]  . Number of children: Not  on file  . Years of education: 12+  . Highest education level: Not on file  Occupational History  . Occupation: [employer]  Social Needs  . Financial resource strain: Not on file  . Food insecurity:    Worry: Not on file    Inability: Not on file  . Transportation needs:    Medical: Not on file    Non-medical: Not on file  Tobacco Use  . Smoking status: Never Smoker  . Smokeless tobacco: Never Used  Substance and Sexual Activity  . Alcohol use: No    Alcohol/week: 0.0 standard drinks  . Drug use: No  . Sexual activity: Yes  Lifestyle  . Physical activity:    Days per week: Not on file    Minutes per session: Not on file  . Stress: Not on file  Relationships  . Social connections:    Talks on phone: Not on file    Gets together: Not on file    Attends religious service: Not on file    Active member of club or organization: Not on file    Attends  meetings of clubs or organizations: Not on file    Relationship status: Not on file  . Intimate partner violence:    Fear of current or ex partner: Not on file    Emotionally abused: Not on file    Physically abused: Not on file    Forced sexual activity: Not on file  Other Topics Concern  . Not on file  Social History Narrative   Lives at home with husband.   Caffeine use: 1-2 drinks per month         Epworth Sleepiness Scale = 15 (as of 01/01/15)     Constitutional: Denies fever, malaise, fatigue, headache or abrupt weight changes.  Respiratory: Denies difficulty breathing, shortness of breath, cough or sputum production.   Cardiovascular: Denies chest pain, chest tightness, palpitations or swelling in the hands or feet.  Gastrointestinal: Pt reports LLQ abdominal pain. Denies bloating, constipation, diarrhea or blood in the stool.  GU: Denies urgency, frequency, pain with urination, burning sensation, blood in urine, odor or discharge. Musculoskeletal: Pt reports left lower back pain, left leg pain. Denies decrease in range of motion, difficulty with gait, or joint  swelling.  Skin: Denies redness, rashes, lesions or ulcercations.  Neurological: Denies numbness, tingling, weakness or problems with balance and coordination.    No other specific complaints in a complete review of systems (except as listed in HPI above).  Objective:   Physical Exam   BP 138/84   Pulse 72   Temp 98.2 F (36.8 C) (Oral)   Wt 225 lb (102.1 kg)   SpO2 98%   BMI 37.16 kg/m  Wt Readings from Last 3 Encounters:  04/23/18 225 lb (102.1 kg)  02/21/18 218 lb (98.9 kg)  01/30/18 215 lb (97.5 kg)    General: Appears her stated age, obese, in NAD. Skin: Warm, dry and intact. No rashes, lesions or ulcerations noted. Cardiovascular: Normal rate and rhythm. S1,S2 noted.  No murmur, rubs or gallops noted. Pulmonary/Chest: Normal effort and positive vesicular breath sounds. No respiratory distress.  No wheezes, rales or ronchi noted.  Abdomen: Soft with mild suprapubic tenderness. Normal bowel sounds. No distention or masses noted.  Musculoskeletal: Normal flexion, extension and rotation of the spine. No bony tenderness noted over the lumbar spine. Pain with palpation over the left SI joint. Strength 4/5 LLE, 5/5 RLE. Gait slow and steady without device. Neurological: Alert  and oriented. Sensation intact to BLE. Negative SLR on the let.   BMET    Component Value Date/Time   NA 140 01/30/2018 1208   NA 146 (H) 01/20/2015 1457   NA 143 10/12/2013 1510   K 4.1 01/30/2018 1208   K 3.9 10/12/2013 1510   CL 104 01/30/2018 1208   CL 109 (H) 10/12/2013 1510   CO2 29 01/30/2018 1208   CO2 29 10/12/2013 1510   GLUCOSE 116 (H) 01/30/2018 1208   GLUCOSE 92 10/12/2013 1510   BUN 20 01/30/2018 1208   BUN 17 01/20/2015 1457   BUN 16 10/12/2013 1510   CREATININE 1.18 01/30/2018 1208   CREATININE 1.15 (H) 10/12/2015 1603   CALCIUM 9.8 01/30/2018 1208   CALCIUM 9.4 10/12/2013 1510   GFRNONAA >60 01/24/2018 2159   GFRNONAA 56 (L) 10/12/2013 1510   GFRAA >60 01/24/2018 2159   GFRAA >60 10/12/2013 1510    Lipid Panel     Component Value Date/Time   CHOL 173 11/27/2017 1558   CHOL 179 10/17/2014 1527   TRIG 79.0 11/27/2017 1558   HDL 66.60 11/27/2017 1558   HDL 59 10/17/2014 1527   CHOLHDL 3 11/27/2017 1558   VLDL 15.8 11/27/2017 1558   LDLCALC 90 11/27/2017 1558   LDLCALC 91 10/17/2014 1527    CBC    Component Value Date/Time   WBC 5.3 01/24/2018 2159   RBC 4.08 01/24/2018 2159   HGB 11.7 (L) 01/24/2018 2159   HGB 13.5 10/12/2013 1510   HGB 12.8 08/13/2009 0946   HCT 36.8 01/24/2018 2159   HCT 40.5 10/12/2013 1510   HCT 36.8 08/13/2009 0946   PLT 230 01/24/2018 2159   PLT 278 10/12/2013 1510   PLT 271 08/13/2009 0946   MCV 90.2 01/24/2018 2159   MCV 87 10/12/2013 1510   MCV 87.0 08/13/2009 0946   MCH 28.7 01/24/2018 2159   MCHC 31.8 01/24/2018 2159   RDW 13.2  01/24/2018 2159   RDW 13.6 10/12/2013 1510   RDW 13.8 08/13/2009 0946   LYMPHSABS 2.1 01/24/2018 2159   LYMPHSABS 1.5 08/13/2009 0946   MONOABS 0.3 01/24/2018 2159   MONOABS 0.2 08/13/2009 0946   EOSABS 0.1 01/24/2018 2159   EOSABS 0.1 08/13/2009 0946   BASOSABS 0.1 01/24/2018 2159   BASOSABS 0.0 08/13/2009 0946    Hgb A1C Lab Results  Component Value Date   HGBA1C 6.7 (H) 03/05/2018           Assessment & Plan:   Suprapubic Pain, Foul Smelling Urine:  Urinalysis: pending Will send urine culture  Acute Low Back Pain with Left Sided Sciatica:  Xray lumbar spine today. RX for Naproxen 500 mg BID (avoid OTC NSAID's) RX for Norflex 100 mg BID Stretching exercises given  Will follow up after xray, return precautions discussed Webb Silversmith, NP

## 2018-04-23 NOTE — Addendum Note (Signed)
Addended by: Lurlean Nanny on: 04/23/2018 03:14 PM   Modules accepted: Orders

## 2018-04-23 NOTE — Patient Instructions (Signed)
Sciatica Rehab  Ask your health care provider which exercises are safe for you. Do exercises exactly as told by your health care provider and adjust them as directed. It is normal to feel mild stretching, pulling, tightness, or discomfort as you do these exercises, but you should stop right away if you feel sudden pain or your pain gets worse.Do not begin these exercises until told by your health care provider.  Stretching and range of motion exercises  These exercises warm up your muscles and joints and improve the movement and flexibility of your hips and your back. These exercises also help to relieve pain, numbness, and tingling.  Exercise A: Sciatic nerve glide  1. Sit in a chair with your head facing down toward your chest. Place your hands behind your back. Let your shoulders slump forward.  2. Slowly straighten one of your knees while you tilt your head back as if you are looking toward the ceiling. Only straighten your leg as far as you can without making your symptoms worse.  3. Hold for __________ seconds.  4. Slowly return to the starting position.  5. Repeat with your other leg.  Repeat __________ times. Complete this exercise __________ times a day.  Exercise B: Knee to chest with hip adduction and internal rotation    1. Lie on your back on a firm surface with both legs straight.  2. Bend one of your knees and move it up toward your chest until you feel a gentle stretch in your lower back and buttock. Then, move your knee toward the shoulder that is on the opposite side from your leg.  ? Hold your leg in this position by holding onto the front of your knee.  3. Hold for __________ seconds.  4. Slowly return to the starting position.  5. Repeat with your other leg.  Repeat __________ times. Complete this exercise __________ times a day.  Exercise C: Prone extension on elbows    1. Lie on your abdomen on a firm surface. A bed may be too soft for this exercise.  2. Prop yourself up on your  elbows.  3. Use your arms to help lift your chest up until you feel a gentle stretch in your abdomen and your lower back.  ? This will place some of your body weight on your elbows. If this is uncomfortable, try stacking pillows under your chest.  ? Your hips should stay down, against the surface that you are lying on. Keep your hip and back muscles relaxed.  4. Hold for __________ seconds.  5. Slowly relax your upper body and return to the starting position.  Repeat __________ times. Complete this exercise __________ times a day.  Strengthening exercises  These exercises build strength and endurance in your back. Endurance is the ability to use your muscles for a long time, even after they get tired.  Exercise D: Pelvic tilt  1. Lie on your back on a firm surface. Bend your knees and keep your feet flat.  2. Tense your abdominal muscles. Tip your pelvis up toward the ceiling and flatten your lower back into the floor.  ? To help with this exercise, you may place a small towel under your lower back and try to push your back into the towel.  3. Hold for __________ seconds.  4. Let your muscles relax completely before you repeat this exercise.  Repeat __________ times. Complete this exercise __________ times a day.  Exercise E: Alternating arm and leg raises      1. Get on your hands and knees on a firm surface. If you are on a hard floor, you may want to use padding to cushion your knees, such as an exercise mat.  2. Line up your arms and legs. Your hands should be below your shoulders, and your knees should be below your hips.  3. Lift your left leg behind you. At the same time, raise your right arm and straighten it in front of you.  ? Do not lift your leg higher than your hip.  ? Do not lift your arm higher than your shoulder.  ? Keep your abdominal and back muscles tight.  ? Keep your hips facing the ground.  ? Do not arch your back.  ? Keep your balance carefully, and do not hold your breath.  4. Hold for  __________ seconds.  5. Slowly return to the starting position and repeat with your right leg and your left arm.  Repeat __________ times. Complete this exercise __________ times a day.  Posture and body mechanics    Body mechanics refers to the movements and positions of your body while you do your daily activities. Posture is part of body mechanics. Good posture and healthy body mechanics can help to relieve stress in your body's tissues and joints. Good posture means that your spine is in its natural S-curve position (your spine is neutral), your shoulders are pulled back slightly, and your head is not tipped forward. The following are general guidelines for applying improved posture and body mechanics to your everyday activities.  Standing     When standing, keep your spine neutral and your feet about hip-width apart. Keep a slight bend in your knees. Your ears, shoulders, and hips should line up.   When you do a task in which you stand in one place for a long time, place one foot up on a stable object that is 2-4 inches (5-10 cm) high, such as a footstool. This helps keep your spine neutral.  Sitting     When sitting, keep your spine neutral and keep your feet flat on the floor. Use a footrest, if necessary, and keep your thighs parallel to the floor. Avoid rounding your shoulders, and avoid tilting your head forward.   When working at a desk or a computer, keep your desk at a height where your hands are slightly lower than your elbows. Slide your chair under your desk so you are close enough to maintain good posture.   When working at a computer, place your monitor at a height where you are looking straight ahead and you do not have to tilt your head forward or downward to look at the screen.  Resting     When lying down and resting, avoid positions that are most painful for you.   If you have pain with activities such as sitting, bending, stooping, or squatting (flexion-based activities), lie in a  position in which your body does not bend very much. For example, avoid curling up on your side with your arms and knees near your chest (fetal position).   If you have pain with activities such as standing for a long time or reaching with your arms (extension-based activities), lie with your spine in a neutral position and bend your knees slightly. Try the following positions:  ? Lying on your side with a pillow between your knees.  ? Lying on your back with a pillow under your knees.  Lifting     When lifting   objects, keep your feet at least shoulder-width apart and tighten your abdominal muscles.   Bend your knees and hips and keep your spine neutral. It is important to lift using the strength of your legs, not your back. Do not lock your knees straight out.   Always ask for help to lift heavy or awkward objects.  This information is not intended to replace advice given to you by your health care provider. Make sure you discuss any questions you have with your health care provider.  Document Released: 01/03/2005 Document Revised: 09/10/2015 Document Reviewed: 09/19/2014  [employer] Interactive Patient Education  2019 [employer]

## 2018-04-24 ENCOUNTER — Encounter: Payer: Self-pay | Admitting: Internal Medicine

## 2018-04-25 ENCOUNTER — Other Ambulatory Visit: Payer: Self-pay | Admitting: Internal Medicine

## 2018-04-25 ENCOUNTER — Other Ambulatory Visit: Payer: Self-pay | Admitting: Hematology and Oncology

## 2018-05-01 ENCOUNTER — Ambulatory Visit: Payer: Medicare Other

## 2018-05-03 ENCOUNTER — Other Ambulatory Visit: Payer: Self-pay | Admitting: Internal Medicine

## 2018-05-08 ENCOUNTER — Other Ambulatory Visit: Payer: Self-pay | Admitting: Internal Medicine

## 2018-05-18 MED ORDER — HYDROCHLOROTHIAZIDE 12.5 MG PO TABS: 12.5000 mg | ORAL_TABLET | Freq: Every day | ORAL | 0 refills | Status: DC

## 2018-05-18 NOTE — Addendum Note (Signed)
Addended by: Lurlean Nanny on: 05/18/2018 11:30 AM   Modules accepted: Orders

## 2018-05-29 ENCOUNTER — Ambulatory Visit: Payer: Medicare Other | Admitting: Cardiovascular Disease

## 2018-06-08 ENCOUNTER — Encounter: Payer: Self-pay | Admitting: Internal Medicine

## 2018-06-08 ENCOUNTER — Other Ambulatory Visit: Payer: Self-pay | Admitting: Internal Medicine

## 2018-06-08 MED ORDER — GLIPIZIDE 5 MG PO TABS: 5.0000 mg | ORAL_TABLET | Freq: Two times a day (BID) | ORAL | 1 refills | Status: DC

## 2018-06-13 ENCOUNTER — Other Ambulatory Visit: Payer: Self-pay | Admitting: Cardiovascular Disease

## 2018-06-18 ENCOUNTER — Encounter: Payer: Self-pay | Admitting: Internal Medicine

## 2018-06-18 ENCOUNTER — Telehealth: Payer: Self-pay | Admitting: Hematology and Oncology

## 2018-06-18 NOTE — Telephone Encounter (Signed)
Called regarding video visit

## 2018-06-18 NOTE — Assessment & Plan Note (Signed)
Right simple mastectomy: Grade 2 IDC with DCIS 1.5 cm, margins -0/3 lymph nodes, T1b N0 stage IA, ER 100%, PR 90%, Ki-67 10%, HER-2 negative ratio 1.28 Oncotype DX score 11:7% risk of recurrence  Current treatment: Letrozole 2.5 mg daily started 04/2016 switched to exemestane 05/26/2016 switched to anastrozole  Breast Cancer Surveillance: 1. Breast exam 06/23/2017: No palpable lumps or nodules of concern 2. Mammogram left breast: 08/23/2017: Benign, breast density category B 3. bone density: 08/23/2017: T score -0.1: Normal.    CT chest and MRI brain 05/31/2017: Negative for metastatic disease X-ray lumbar spine 04/23/2018: Degenerative joint disease L4-L5  Anastrozole toxicities: Muscle and joint pains are tolerable  Return to clinic in 1 year for follow-up

## 2018-06-19 MED ORDER — GLIPIZIDE 5 MG PO TABS: 5.0000 mg | ORAL_TABLET | Freq: Two times a day (BID) | ORAL | 1 refills | Status: DC

## 2018-06-20 ENCOUNTER — Other Ambulatory Visit: Payer: Self-pay

## 2018-06-20 MED ORDER — ATENOLOL 25 MG PO TABS: 25.0000 mg | ORAL_TABLET | Freq: Two times a day (BID) | ORAL | 3 refills | Status: DC

## 2018-06-24 NOTE — Progress Notes (Signed)
HEMATOLOGY-ONCOLOGY my chart video VISIT PROGRESS NOTE  I connected with Carrie Singh on 06/25/2018 at  8:30 AM EDT by Doximity video conference and verified that I am speaking with the correct person using two identifiers.  I discussed the limitations, risks, security and privacy concerns of performing an evaluation and management service by Doximity and the availability of in person appointments.  I also discussed with the patient that there may be a patient responsible charge related to this service. The patient expressed understanding and agreed to proceed.  Patient's Location: Home Physician Location: Clinic  CHIEF COMPLIANT: Follow-up on anastrozole therapy  INTERVAL HISTORY: Carrie Singh is a 63 y.o. female with above-mentioned history of right breast cancer treated with mastectomy followed by antiestrogen therapy with anastrozole. I last saw her a year ago. Her most recent mammogram on 08/23/17 showed no evidence of malignancy. Bone density scan on 08/23/17 showed a T-score of -0.1. She presents over my chart video visit today for annual follow-up.  She gets intermittent sharp discomfort in the surgical scar tissue that lasts only for a few seconds.  It is tolerable.  She has trouble with putting on the bra with the insert because it feels heavy and uncomfortable.  Denies any lumps or nodules.    Malignant neoplasm of lower-outer quadrant of right breast of female, estrogen receptor positive ([place])   12/18/2007 Initial Diagnosis    Right breast cancer treated with lumpectomy followed by radiation and 5 years of antiestrogen therapy with aromatase inhibitor    02/02/2016 Relapse/Recurrence    Right breast biopsy 6:00 position: IDC with DCIS grade 2, ER 100%, PR 90%, Ki-67 10%, HER-2 negative ratio 1.28, screening detected right breast lumps 0.5 cm and 0.3 cm, T1a N0 stage IA clinical stage    03/18/2016 Surgery    Right simple mastectomy: Grade 2 IDC with DCIS 1.5 cm, margins -0/3 lymph nodes,  T1b N0 stage IA, ER 100%, PR 90%, Ki-67 10%, HER-2 negative ratio 1.28    03/18/2016 Oncotype testing    11/7%    04/2016 -  Anti-estrogen oral therapy    Letrozole 2.58m daily switch to exemestane 25 mg daily May 2018 switched to anastrozole 1 mg daily due to cost     REVIEW OF SYSTEMS:   Constitutional: Denies fevers, chills or abnormal weight loss Eyes: Denies blurriness of vision Ears, nose, mouth, throat, and face: Denies mucositis or sore throat Respiratory: Denies cough, dyspnea or wheezes Cardiovascular: Denies palpitation, chest discomfort Gastrointestinal:  Denies nausea, heartburn or change in bowel habits Skin: Denies abnormal skin rashes Lymphatics: Denies new lymphadenopathy or easy bruising Neurological:Denies numbness, tingling or new weaknesses Behavioral/Psych: Mood is stable, no new changes  Extremities: No lower extremity edema Breast: Right mastectomy with surgical scar discomfort All other systems were reviewed with the patient and are negative.  Observations/Objective:  There were no vitals filed for this visit. There is no height or weight on file to calculate BMI.  I have reviewed the data as listed CMP Latest Ref Rng & Units 01/30/2018 01/24/2018 01/22/2018  Glucose 70 - 99 mg/dL 116(H) 154(H) 120(H)  BUN 6 - 23 mg/dL _0 Creatinine 0.40 - 1.20 mg/dL 1.18 0.95 1.11(H)  Sodium 135 - 145 mEq/L 140 140 142  Potassium 3.5 - 5.1 mEq/L 4.1 3.4(L) 4.0  Chloride 96 - 112 mEq/L 104 109 111  CO2 19 - 32 mEq/L _1 Calcium 8.4 - 10.5 mg/dL 9.8 9.0 9.5  Total Protein 6.5 -  8.1 g/dL - - 7.0  Total Bilirubin 0.3 - 1.2 mg/dL - - 1.1  Alkaline Phos 38 - 126 U/L - - 67  AST 15 - 41 U/L - - 16  ALT 0 - 44 U/L - - 18    Lab Results  Component Value Date   WBC 5.3 01/24/2018   HGB 11.7 (L) 01/24/2018   HCT 36.8 01/24/2018   MCV 90.2 01/24/2018   PLT 230 01/24/2018   NEUTROABS 2.7 01/24/2018      Assessment Plan:  Malignant neoplasm of lower-outer  quadrant of right breast of female, estrogen receptor positive ([facility]) Right simple mastectomy: Grade 2 IDC with DCIS 1.5 cm, margins -0/3 lymph nodes, T1b N0 stage IA, ER 100%, PR 90%, Ki-67 10%, HER-2 negative ratio 1.28 Oncotype DX score 11:7% risk of recurrence  Current treatment: Letrozole 2.5 mg daily started 04/2016 switched to exemestane 05/26/2016 switched to anastrozole  Breast Cancer Surveillance: 1. Breast exam 06/23/2017: No palpable lumps or nodules of concern 2. Mammogram left breast: 08/23/2017: Benign, breast density category B 3. bone density: 08/23/2017: T score -0.1: Normal.    CT chest and MRI brain 05/31/2017: Negative for metastatic disease X-ray lumbar spine 04/23/2018: Degenerative joint disease L4-L5  Anastrozole toxicities: Muscle and joint pains are tolerable  Return to clinic in 1 year for follow-up    I discussed the assessment and treatment plan with the patient. The patient was provided an opportunity to ask questions and all were answered. The patient agreed with the plan and demonstrated an understanding of the instructions. The patient was advised to call back or seek an in-person evaluation if the symptoms worsen or if the condition fails to improve as anticipated.   I provided 15 minutes of face-to-face Doximity time during this encounter.    Vinay K Gudena, MD 06/25/2018   I, Molly Dorshimer, am acting as scribe for Vinay Gudena, MD.  I have reviewed the above documentation for accuracy and completeness, and I agree with the above.    

## 2018-06-25 ENCOUNTER — Inpatient Hospital Stay: Payer: Medicare Other | Attending: Hematology and Oncology | Admitting: Hematology and Oncology

## 2018-06-25 DIAGNOSIS — Z79811 Long term (current) use of aromatase inhibitors: Secondary | ICD-10-CM

## 2018-06-25 DIAGNOSIS — Z9011 Acquired absence of right breast and nipple: Secondary | ICD-10-CM

## 2018-06-25 DIAGNOSIS — Z170 Estrogen receptor positive status [ER+]: Secondary | ICD-10-CM

## 2018-06-25 DIAGNOSIS — C50511 Malignant neoplasm of lower-outer quadrant of right female breast: Secondary | ICD-10-CM | POA: Diagnosis not present

## 2018-06-25 MED ORDER — ANASTROZOLE 1 MG PO TABS: 1.0000 mg | ORAL_TABLET | Freq: Every day | ORAL | 3 refills | Status: DC

## 2018-07-01 ENCOUNTER — Other Ambulatory Visit: Payer: Self-pay | Admitting: Internal Medicine

## 2018-07-19 ENCOUNTER — Other Ambulatory Visit: Payer: Self-pay | Admitting: Internal Medicine

## 2018-07-27 ENCOUNTER — Encounter: Payer: Self-pay | Admitting: Internal Medicine

## 2018-07-30 ENCOUNTER — Other Ambulatory Visit: Payer: Self-pay

## 2018-07-30 ENCOUNTER — Ambulatory Visit (INDEPENDENT_AMBULATORY_CARE_PROVIDER_SITE_OTHER): Payer: Medicare Other | Admitting: Internal Medicine

## 2018-07-30 ENCOUNTER — Encounter: Payer: Self-pay | Admitting: Internal Medicine

## 2018-07-30 VITALS — BP 146/90 | HR 71 | Temp 98.6°F | Ht 65.25 in | Wt 231.0 lb

## 2018-07-30 DIAGNOSIS — F329 Major depressive disorder, single episode, unspecified: Secondary | ICD-10-CM

## 2018-07-30 DIAGNOSIS — E119 Type 2 diabetes mellitus without complications: Secondary | ICD-10-CM

## 2018-07-30 DIAGNOSIS — G44011 Episodic cluster headache, intractable: Secondary | ICD-10-CM | POA: Diagnosis not present

## 2018-07-30 DIAGNOSIS — K219 Gastro-esophageal reflux disease without esophagitis: Secondary | ICD-10-CM | POA: Diagnosis not present

## 2018-07-30 DIAGNOSIS — F450 Somatization disorder: Secondary | ICD-10-CM

## 2018-07-30 DIAGNOSIS — F32A Depression, unspecified: Secondary | ICD-10-CM

## 2018-07-30 DIAGNOSIS — C50511 Malignant neoplasm of lower-outer quadrant of right female breast: Secondary | ICD-10-CM

## 2018-07-30 DIAGNOSIS — E782 Mixed hyperlipidemia: Secondary | ICD-10-CM

## 2018-07-30 DIAGNOSIS — I10 Essential (primary) hypertension: Secondary | ICD-10-CM

## 2018-07-30 DIAGNOSIS — Z170 Estrogen receptor positive status [ER+]: Secondary | ICD-10-CM

## 2018-07-30 LAB — POCT GLYCOSYLATED HEMOGLOBIN (HGB A1C): Hemoglobin A1C: 7.6 % — AB (ref 4.0–5.6)

## 2018-07-30 MED ORDER — IRBESARTAN 300 MG PO TABS: 300.0000 mg | ORAL_TABLET | Freq: Every day | ORAL | 3 refills | Status: DC

## 2018-07-30 MED ORDER — GLIPIZIDE 10 MG PO TABS: 10.0000 mg | ORAL_TABLET | Freq: Two times a day (BID) | ORAL | 0 refills | Status: DC

## 2018-07-30 NOTE — Assessment & Plan Note (Signed)
Followed by Dr. Lindi Adie.

## 2018-07-30 NOTE — Assessment & Plan Note (Signed)
Currently not an issue. Support offered. Denies SI/HI at this time.  Continue to monitor.

## 2018-07-30 NOTE — Assessment & Plan Note (Signed)
Currently not an issue 

## 2018-07-30 NOTE — Assessment & Plan Note (Signed)
Currently controlled with Hydralazine, Irbesartan, Tenormin, HCTZ. Reinforced DASH diet and exercise for weight loss. Continue to monitor.

## 2018-07-30 NOTE — Assessment & Plan Note (Signed)
A1C checked today 7.6. Encouraged low carb diet and increasing exercise. Referral to nutritionist completed today. Increase glipizide to 10 mg po BID for now. Instructed to update me with 2 weeks worth of fasting blood sugar levels. Foot exam today. Eye exam UTD.

## 2018-07-30 NOTE — Assessment & Plan Note (Signed)
Currently stable on Pravastatin.  Continue as prescribed. Instructed to consume low fat diet and increase exercise.

## 2018-07-30 NOTE — Patient Instructions (Signed)

## 2018-07-30 NOTE — Progress Notes (Signed)
Acute Office Visit  Subjective:    Patient ID: Carrie Singh, female    DOB: [birth year], 63 y.o.   MRN: [id number]  HPI   Patient presents to the clinic today for follow up of chronic conditions.  Hx of breast CA: s/p chemo, radiation, and mastectomy.  She is taking Arimadex as prescribed.  She follows with Dr. Lindi Adie regularly.   Depression:  Triggered by chronic health issues.  She is not taking any antidepressant medication at this time.  She denies anxiety, SI/HI.  DM 2:  Her last A1C was 6.7 on 02/2018.  She is monitoring her blood sugars at home which range 165- 325- mostly stays in the 200s (she checks BS in morning and before bed).  She is taking Glipizide as prescribed.  She cannot take Metformin due to kidney dysfunction (she has taken a few when her BS was in the 300s).  She checks her feet routinely.  Her last [facility] exam was in early 2020 at [facility].  Reports eating more since being quarantined.  Reports she is trying to stay away from sweets but endorses eating lots of carbs.  Eats fried foods once weekly.  Not getting routine exercise at this time besides walking in the house.   GERD:  Intermittent.  She is not sure what triggers this.  She is not taking any OTC medication at this time. There is no upper GI on file.  HLD:  Her last LDL was 90 on 11/2017.  She denies myalgias on Pravastatin.  She does eat some fried foods.  HTN:  Her BP today is 146/90.  She is taking Irbesartan (need refill- ran out yesterday), Tenormin, HCTZ, and Hydralazine as prescribed.  ECG from 01/2018 reviewed.  Migraines:  She reports she has frequent headaches but not a migraine.  She takes [employer] as needed with good relief.  Seizures:  Questionable diagnosis.  No seizures off seizure medications.  She no longer follows with neurology.    Past Medical History:  Diagnosis Date  . Breast cancer ([place])   . Breast cancer, right breast ([place]) 12/2007   a. s/p chemo, radiation, and lumpectomy with  negative sentinel lymph node biopsy   . Depression   . Diabetes mellitus without complication ([place])   . GERD (gastroesophageal reflux disease)   . Hyperlipidemia   . Hypertension    resistant, negative renal Dopplers and negative CT angiogram  . Migraines   . Obesity   . Seizures ([place])    last seizure  1 week ago: states she jumps for a second and it's gone    Current Outpatient Medications  Medication Sig Dispense Refill  . anastrozole (ARIMIDEX) 1 MG tablet Take 1 tablet (1 mg total) by mouth daily. 90 tablet 3  . Aspirin-Acetaminophen-Caffeine (GOODY HEADACHE PO) Take 1 packet by mouth as needed (for migraines).     Marland Kitchen atenolol (TENORMIN) 25 MG tablet Take 1 tablet (25 mg total) by mouth 2 (two) times daily. 180 tablet 3  . Biotin w/ Vitamins C & E (HAIR SKIN & NAILS GUMMIES PO) Take 1 tablet by mouth daily.     . hydrALAZINE (APRESOLINE) 100 MG tablet TAKE 1 TABLET BY MOUTH 3  TIMES DAILY 270 tablet 3  . hydrochlorothiazide (HYDRODIURIL) 12.5 MG tablet TAKE 1 TABLET BY MOUTH  DAILY 90 tablet 0  . irbesartan (AVAPRO) 300 MG tablet Take 1 tablet (300 mg total) by mouth daily. 90 tablet 3  . Multiple Vitamins-Minerals (ONE-A-DAY WOMENS VITACRAVES) CHEW  Chew 1 tablet by mouth daily.     . pravastatin (PRAVACHOL) 40 MG tablet TAKE 1 TABLET BY MOUTH  DAILY 90 tablet 0  . glipiZIDE (GLUCOTROL) 10 MG tablet Take 1 tablet (10 mg total) by mouth 2 (two) times daily before a meal. 180 tablet 0   No current facility-administered medications for this visit.     No Known Allergies  Family History  Problem Relation Age of Onset  . Stroke Mother   . Heart disease Father   . Asthma Father   . Cancer Father        colon  . Cancer Sister        lung  . Cancer Brother        colon  . Breast cancer Other 48  . Diabetes Neg Hx     Social History   Socioeconomic History  . Marital status: Married    Spouse name: [name]  . Number of children: Not on file  . Years of education: 12+  .  Highest education level: Not on file  Occupational History  . Occupation: [employer]  Social Needs  . Financial resource strain: Not on file  . Food insecurity    Worry: Not on file    Inability: Not on file  . Transportation needs    Medical: Not on file    Non-medical: Not on file  Tobacco Use  . Smoking status: Never Smoker  . Smokeless tobacco: Never Used  Substance and Sexual Activity  . Alcohol use: No    Alcohol/week: 0.0 standard drinks  . Drug use: No  . Sexual activity: Yes  Lifestyle  . Physical activity    Days per week: Not on file    Minutes per session: Not on file  . Stress: Not on file  Relationships  . Social Herbalist on phone: Not on file    Gets together: Not on file    Attends religious service: Not on file    Active member of club or organization: Not on file    Attends meetings of clubs or organizations: Not on file    Relationship status: Not on file  . Intimate partner violence    Fear of current or ex partner: Not on file    Emotionally abused: Not on file    Physically abused: Not on file    Forced sexual activity: Not on file  Other Topics Concern  . Not on file  Social History Narrative   Lives at home with husband.   Caffeine use: 1-2 drinks per month         Epworth Sleepiness Scale = 15 (as of 01/01/15)     Constitutional: Complains of recent weight gain since quarantine, intermittent headaches.  Denies fever, malaise, fatigue.  Respiratory: Denies difficulty breathing, shortness of breath, cough or sputum production.   Cardiovascular: Denies chest pain, chest tightness, palpitations or swelling in the hands or feet.  Psych: Pt has a history of depression. Denies anxiety, SI/HI.  No other specific complaints in a complete review of systems (except as listed in HPI above).    Objective:    Physical Exam  BP (!) 146/90 (BP Location: Left Arm, Patient Position: Sitting, Cuff Size: Large)   Pulse 71   Temp 98.6 F (37 C)  (Temporal)   Ht 5' 5.25" (1.657 m)   Wt 231 lb (104.8 kg)   SpO2 98%   BMI 38.15 kg/m  Wt Readings from Last 3 Encounters:  07/30/18 231 lb (104.8 kg)  04/23/18 225 lb (102.1 kg)  02/21/18 218 lb (98.9 kg)    General: Appears her stated age, obese in NAD. Skin: Warm, dry and intact. No ulcerations noted. Cardiovascular: Normal rate and rhythm. S1,S2 noted.  No murmur, rubs or gallops noted. No JVD or BLE edema. No carotid bruits noted. Pulmonary/Chest: Normal effort and positive vesicular breath sounds. No respiratory distress. No wheezes, rales or ronchi noted.  Psychiatric: Mood and affect normal. Behavior is normal. Judgment and thought content normal.     BMET    Component Value Date/Time   NA 140 01/30/2018 1208   NA 146 (H) 01/20/2015 1457   NA 143 10/12/2013 1510   K 4.1 01/30/2018 1208   K 3.9 10/12/2013 1510   CL 104 01/30/2018 1208   CL 109 (H) 10/12/2013 1510   CO2 29 01/30/2018 1208   CO2 29 10/12/2013 1510   GLUCOSE 116 (H) 01/30/2018 1208   GLUCOSE 92 10/12/2013 1510   BUN 20 01/30/2018 1208   BUN 17 01/20/2015 1457   BUN 16 10/12/2013 1510   CREATININE 1.18 01/30/2018 1208   CREATININE 1.15 (H) 10/12/2015 1603   CALCIUM 9.8 01/30/2018 1208   CALCIUM 9.4 10/12/2013 1510   GFRNONAA >60 01/24/2018 2159   GFRNONAA 56 (L) 10/12/2013 1510   GFRAA >60 01/24/2018 2159   GFRAA >60 10/12/2013 1510    Lipid Panel     Component Value Date/Time   CHOL 173 11/27/2017 1558   CHOL 179 10/17/2014 1527   TRIG 79.0 11/27/2017 1558   HDL 66.60 11/27/2017 1558   HDL 59 10/17/2014 1527   CHOLHDL 3 11/27/2017 1558   VLDL 15.8 11/27/2017 1558   LDLCALC 90 11/27/2017 1558   LDLCALC 91 10/17/2014 1527    CBC    Component Value Date/Time   WBC 5.3 01/24/2018 2159   RBC 4.08 01/24/2018 2159   HGB 11.7 (L) 01/24/2018 2159   HGB 13.5 10/12/2013 1510   HGB 12.8 08/13/2009 0946   HCT 36.8 01/24/2018 2159   HCT 40.5 10/12/2013 1510   HCT 36.8 08/13/2009 0946    PLT 230 01/24/2018 2159   PLT 278 10/12/2013 1510   PLT 271 08/13/2009 0946   MCV 90.2 01/24/2018 2159   MCV 87 10/12/2013 1510   MCV 87.0 08/13/2009 0946   MCH 28.7 01/24/2018 2159   MCHC 31.8 01/24/2018 2159   RDW 13.2 01/24/2018 2159   RDW 13.6 10/12/2013 1510   RDW 13.8 08/13/2009 0946   LYMPHSABS 2.1 01/24/2018 2159   LYMPHSABS 1.5 08/13/2009 0946   MONOABS 0.3 01/24/2018 2159   MONOABS 0.2 08/13/2009 0946   EOSABS 0.1 01/24/2018 2159   EOSABS 0.1 08/13/2009 0946   BASOSABS 0.1 01/24/2018 2159   BASOSABS 0.0 08/13/2009 0946    Hgb A1C Lab Results  Component Value Date   HGBA1C 7.6 (A) 07/30/2018          Assessment & Plan:

## 2018-08-06 ENCOUNTER — Other Ambulatory Visit: Payer: Self-pay | Admitting: Adult Health

## 2018-08-06 DIAGNOSIS — Z1231 Encounter for screening mammogram for malignant neoplasm of breast: Secondary | ICD-10-CM

## 2018-08-08 ENCOUNTER — Telehealth: Payer: Self-pay | Admitting: Internal Medicine

## 2018-08-08 NOTE — Telephone Encounter (Signed)
Khyri with [employer] called today and stated that a request was sent over 3 weeks ago for the patient's Diabetic supply but they did not receive a response back. They re faxed this to [place] today and wanted to make sure this has been received.  [phone]  If you need this refaxed. Or questions

## 2018-08-11 ENCOUNTER — Encounter: Payer: Self-pay | Admitting: Internal Medicine

## 2018-08-13 ENCOUNTER — Encounter: Payer: Self-pay | Admitting: Internal Medicine

## 2018-08-13 NOTE — Telephone Encounter (Signed)
Carrie Singh called stating that he had requested that the pharmacy re-fax the request and he wants to make sure that you got it. Please call and let him know if you received it or not. [phone]

## 2018-08-13 NOTE — Telephone Encounter (Signed)
Pt asked pharmacy to refax order

## 2018-08-14 ENCOUNTER — Ambulatory Visit (INDEPENDENT_AMBULATORY_CARE_PROVIDER_SITE_OTHER): Payer: Medicare Other | Admitting: Internal Medicine

## 2018-08-14 ENCOUNTER — Encounter: Payer: Self-pay | Admitting: Internal Medicine

## 2018-08-14 ENCOUNTER — Other Ambulatory Visit: Payer: Self-pay

## 2018-08-14 ENCOUNTER — Telehealth: Payer: Self-pay

## 2018-08-14 VITALS — BP 130/88 | HR 89 | Temp 97.9°F | Wt 231.8 lb

## 2018-08-14 DIAGNOSIS — M5442 Lumbago with sciatica, left side: Secondary | ICD-10-CM

## 2018-08-14 DIAGNOSIS — R3915 Urgency of urination: Secondary | ICD-10-CM | POA: Diagnosis not present

## 2018-08-14 DIAGNOSIS — R350 Frequency of micturition: Secondary | ICD-10-CM | POA: Diagnosis not present

## 2018-08-14 MED ORDER — TIZANIDINE HCL 2 MG PO CAPS: 2.0000 mg | ORAL_CAPSULE | Freq: Every evening | ORAL | 0 refills | Status: DC | PRN

## 2018-08-14 MED ORDER — ACCU-CHEK AVIVA PLUS W/DEVICE KIT: 1.0000 | PACK | Freq: Once | 0 refills | Status: AC

## 2018-08-14 MED ORDER — TRAMADOL HCL 50 MG PO TABS: 50.0000 mg | ORAL_TABLET | Freq: Three times a day (TID) | ORAL | 0 refills | Status: AC | PRN

## 2018-08-14 MED ORDER — ACCU-CHEK AVIVA PLUS VI STRP: 1.0000 | ORAL_STRIP | Freq: Two times a day (BID) | 2 refills | Status: DC

## 2018-08-14 NOTE — Patient Instructions (Signed)
Sciatica ° °Sciatica is pain, weakness, tingling, or loss of feeling (numbness) along the sciatic nerve. The sciatic nerve starts in the lower back and goes down the back of each leg. Sciatica usually goes away on its own or with treatment. Sometimes, sciatica may come back (recur). °What are the causes? °This condition happens when the sciatic nerve is pinched or has pressure put on it. This may be the result of: °· A disk in between the bones of the spine bulging out too far (herniated disk). °· Changes in the spinal disks that occur with aging. °· A condition that affects a muscle in the butt. °· Extra bone growth near the sciatic nerve. °· A break (fracture) of the area between your hip bones (pelvis). °· Pregnancy. °· Tumor. This is rare. °What increases the risk? °You are more likely to develop this condition if you: °· Play sports that put pressure or stress on the spine. °· Have poor strength and ease of movement (flexibility). °· Have had a back injury in the past. °· Have had back surgery. °· Sit for long periods of time. °· Do activities that involve bending or lifting over and over again. °· Are very overweight (obese). °What are the signs or symptoms? °Symptoms can vary from mild to very bad. They may include: °· Any of these problems in the lower back, leg, hip, or butt: °? Mild tingling, loss of feeling, or dull aches. °? Burning sensations. °? Sharp pains. °· Loss of feeling in the back of the calf or the sole of the foot. °· Leg weakness. °· Very bad back pain that makes it hard to move. °These symptoms may get worse when you cough, sneeze, or laugh. They may also get worse when you sit or stand for long periods of time. °How is this treated? °This condition often gets better without any treatment. However, treatment may include: °· Changing or cutting back on physical activity when you have pain. °· Doing exercises and stretching. °· Putting ice or heat on the affected area. °· Medicines that  help: °? To relieve pain and swelling. °? To relax your muscles. °· Shots (injections) of medicines that help to relieve pain, irritation, and swelling. °· Surgery. °Follow these instructions at home: °Medicines °· Take over-the-counter and prescription medicines only as told by your doctor. °· Ask your doctor if the medicine prescribed to you: °? Requires you to avoid driving or using heavy machinery. °? Can cause trouble pooping (constipation). You may need to take these steps to prevent or treat trouble pooping: °§ Drink enough fluids to keep your pee (urine) pale yellow. °§ Take over-the-counter or prescription medicines. °§ Eat foods that are high in fiber. These include beans, whole grains, and fresh fruits and vegetables. °§ Limit foods that are high in fat and sugar. These include fried or sweet foods. °Managing pain ° °  ° °· If told, put ice on the affected area. °? Put ice in a plastic bag. °? Place a towel between your skin and the bag. °? Leave the ice on for 20 minutes, 2-3 times a day. °· If told, put heat on the affected area. Use the heat source that your doctor tells you to use, such as a moist heat pack or a heating pad. °? Place a towel between your skin and the heat source. °? Leave the heat on for 20-30 minutes. °? Remove the heat if your skin turns bright red. This is very important if you are   unable to feel pain, heat, or cold. You may have a greater risk of getting burned. °Activity ° °· Return to your normal activities as told by your doctor. Ask your doctor what activities are safe for you. °· Avoid activities that make your symptoms worse. °· Take short rests during the day. °? When you rest for a long time, do some physical activity or stretching between periods of rest. °? Avoid sitting for a long time without moving. Get up and move around at least one time each hour. °· Exercise and stretch regularly, as told by your doctor. °· Do not lift anything that is heavier than 10 lb (4.5 kg)  while you have symptoms of sciatica. °? Avoid lifting heavy things even when you do not have symptoms. °? Avoid lifting heavy things over and over. °· When you lift objects, always lift in a way that is safe for your body. To do this, you should: °? Bend your knees. °? Keep the object close to your body. °? Avoid twisting. °General instructions °· Stay at a healthy weight. °· Wear comfortable shoes that support your feet. Avoid wearing high heels. °· Avoid sleeping on a mattress that is too soft or too hard. You might have less pain if you sleep on a mattress that is firm enough to support your back. °· Keep all follow-up visits as told by your doctor. This is important. °Contact a doctor if: °· You have pain that: °? Wakes you up when you are sleeping. °? Gets worse when you lie down. °? Is worse than the pain you have had in the past. °? Lasts longer than 4 weeks. °· You lose weight without trying. °Get help right away if: °· You cannot control when you pee (urinate) or poop (have a bowel movement). °· You have weakness in any of these areas and it gets worse: °? Lower back. °? The area between your hip bones. °? Butt. °? Legs. °· You have redness or swelling of your back. °· You have a burning feeling when you pee. °Summary °· Sciatica is pain, weakness, tingling, or loss of feeling (numbness) along the sciatic nerve. °· This condition happens when the sciatic nerve is pinched or has pressure put on it. °· Sciatica can cause pain, tingling, or loss of feeling (numbness) in the lower back, legs, hips, and butt. °· Treatment often includes rest, exercise, medicines, and putting ice or heat on the affected area. °This information is not intended to replace advice given to you by your health care provider. Make sure you discuss any questions you have with your health care provider. °Document Released: 10/13/2007 Document Revised: 01/22/2018 Document Reviewed: 01/22/2018 °Elsevier Patient Education © 2020 [employer] ° °

## 2018-08-14 NOTE — Addendum Note (Signed)
Addended by: Lurlean Nanny on: 08/14/2018 01:56 PM   Modules accepted: Orders

## 2018-08-14 NOTE — Telephone Encounter (Signed)
Will discuss at upcoming appt.

## 2018-08-14 NOTE — Progress Notes (Signed)
Subjective:    Patient ID: Carrie Singh, female    DOB: 04-14-1955, 63 y.o.   MRN: [id number]  HPI  Pt presents to the clinic today with c/o left lower back pain. This started last night. She describes the pain as achy, burning. The pain radiates into her left leg. She denies numbness, tingling or weakness. She denies any injury to the area. She does have some urgency, frequency of urine but is not sure this is related. She has tried [employer] with some relief. Xray of her lumbar spine from 04/2018 showed:  IMPRESSION: Mild-to-moderate multilevel lumbar spine DDD, worse at L4-L5   She has never tried PT for this issue.   Review of Systems      Past Medical History:  Diagnosis Date  . Breast cancer ([place])   . Breast cancer, right breast ([place]) 12/2007   a. s/p chemo, radiation, and lumpectomy with negative sentinel lymph node biopsy   . Depression   . Diabetes mellitus without complication ([place])   . GERD (gastroesophageal reflux disease)   . Hyperlipidemia   . Hypertension    resistant, negative renal Dopplers and negative CT angiogram  . Migraines   . Obesity   . Seizures ([place])    last seizure  1 week ago: states she jumps for a second and it's gone    Current Outpatient Medications  Medication Sig Dispense Refill  . anastrozole (ARIMIDEX) 1 MG tablet Take 1 tablet (1 mg total) by mouth daily. 90 tablet 3  . Aspirin-Acetaminophen-Caffeine (GOODY HEADACHE PO) Take 1 packet by mouth as needed (for migraines).     Marland Kitchen atenolol (TENORMIN) 25 MG tablet Take 1 tablet (25 mg total) by mouth 2 (two) times daily. 180 tablet 3  . Biotin w/ Vitamins C & E (HAIR SKIN & NAILS GUMMIES PO) Take 1 tablet by mouth daily.     Marland Kitchen glipiZIDE (GLUCOTROL) 10 MG tablet Take 1 tablet (10 mg total) by mouth 2 (two) times daily before a meal. 180 tablet 0  . hydrALAZINE (APRESOLINE) 100 MG tablet TAKE 1 TABLET BY MOUTH 3  TIMES DAILY 270 tablet 3  . hydrochlorothiazide (HYDRODIURIL) 12.5 MG tablet  TAKE 1 TABLET BY MOUTH  DAILY 90 tablet 0  . irbesartan (AVAPRO) 300 MG tablet Take 1 tablet (300 mg total) by mouth daily. 90 tablet 3  . Multiple Vitamins-Minerals (ONE-A-DAY WOMENS VITACRAVES) CHEW Chew 1 tablet by mouth daily.     . pravastatin (PRAVACHOL) 40 MG tablet TAKE 1 TABLET BY MOUTH  DAILY 90 tablet 0   No current facility-administered medications for this visit.     No Known Allergies  Family History  Problem Relation Age of Onset  . Stroke Mother   . Heart disease Father   . Asthma Father   . Cancer Father        colon  . Cancer Sister        lung  . Cancer Brother        colon  . Breast cancer Other 48  . Diabetes Neg Hx     Social History   Socioeconomic History  . Marital status: Married    Spouse name: [name]  . Number of children: Not on file  . Years of education: 12+  . Highest education level: Not on file  Occupational History  . Occupation: [employer]  Social Needs  . Financial resource strain: Not on file  . Food insecurity    Worry: Not on file  Inability: Not on file  . Transportation needs    Medical: Not on file    Non-medical: Not on file  Tobacco Use  . Smoking status: Never Smoker  . Smokeless tobacco: Never Used  Substance and Sexual Activity  . Alcohol use: No    Alcohol/week: 0.0 standard drinks  . Drug use: No  . Sexual activity: Yes  Lifestyle  . Physical activity    Days per week: Not on file    Minutes per session: Not on file  . Stress: Not on file  Relationships  . Social Herbalist on phone: Not on file    Gets together: Not on file    Attends religious service: Not on file    Active member of club or organization: Not on file    Attends meetings of clubs or organizations: Not on file    Relationship status: Not on file  . Intimate partner violence    Fear of current or ex partner: Not on file    Emotionally abused: Not on file    Physically abused: Not on file    Forced sexual activity: Not on file   Other Topics Concern  . Not on file  Social History Narrative   Lives at home with husband.   Caffeine use: 1-2 drinks per month         Epworth Sleepiness Scale = 15 (as of 01/01/15)     Constitutional: Denies fever, malaise, fatigue, headache or abrupt weight changes.  Respiratory: Denies difficulty breathing, shortness of breath, cough or sputum production.   Cardiovascular: Denies chest pain, chest tightness, palpitations or swelling in the hands or feet.  Gastrointestinal: Denies abdominal pain, bloating, constipation, diarrhea or blood in the stool.  GU: Pt reports urgency and frequency. Denies pain with urination, burning sensation, blood in urine, odor or discharge. Musculoskeletal: Pt reports left side low back pain, left leg pain. Denies decrease in range of motion, difficulty with gait, muscle pain or joint swelling.  Neurological: Denies numbness, tingling, weakness or problems with balance and coordination.    No other specific complaints in a complete review of systems (except as listed in HPI above).  Objective:   Physical Exam  BP 130/88   Pulse 89   Temp 97.9 F (36.6 C) (Temporal)   Wt 231 lb 12.8 oz (105.1 kg)   SpO2 97%   BMI 38.28 kg/m   Wt Readings from Last 3 Encounters:  07/30/18 231 lb (104.8 kg)  04/23/18 225 lb (102.1 kg)  02/21/18 218 lb (98.9 kg)    General: Appears her stated age, obese, in NAD. Skin: Warm, dry and intact. No rashes noted. Abdomen: Soft and nontender. Normal bowel sounds. No distention or masses noted. No CVA tenderness noted. Musculoskeletal: Normal flexion, extension and rotation of the spine. No bony tenderness noted over the spine. Pain with palpation of the left paraspinal muscles. Able to walk on heels and toes. Gait slow but steady. Neurological: Alert and oriented. Negative SLR on the left.   BMET    Component Value Date/Time   NA 140 01/30/2018 1208   NA 146 (H) 01/20/2015 1457   NA 143 10/12/2013 1510   K  4.1 01/30/2018 1208   K 3.9 10/12/2013 1510   CL 104 01/30/2018 1208   CL 109 (H) 10/12/2013 1510   CO2 29 01/30/2018 1208   CO2 29 10/12/2013 1510   GLUCOSE 116 (H) 01/30/2018 1208   GLUCOSE 92 10/12/2013 1510  BUN 20 01/30/2018 1208   BUN 17 01/20/2015 1457   BUN 16 10/12/2013 1510   CREATININE 1.18 01/30/2018 1208   CREATININE 1.15 (H) 10/12/2015 1603   CALCIUM 9.8 01/30/2018 1208   CALCIUM 9.4 10/12/2013 1510   GFRNONAA >60 01/24/2018 2159   GFRNONAA 56 (L) 10/12/2013 1510   GFRAA >60 01/24/2018 2159   GFRAA >60 10/12/2013 1510    Lipid Panel     Component Value Date/Time   CHOL 173 11/27/2017 1558   CHOL 179 10/17/2014 1527   TRIG 79.0 11/27/2017 1558   HDL 66.60 11/27/2017 1558   HDL 59 10/17/2014 1527   CHOLHDL 3 11/27/2017 1558   VLDL 15.8 11/27/2017 1558   LDLCALC 90 11/27/2017 1558   LDLCALC 91 10/17/2014 1527    CBC    Component Value Date/Time   WBC 5.3 01/24/2018 2159   RBC 4.08 01/24/2018 2159   HGB 11.7 (L) 01/24/2018 2159   HGB 13.5 10/12/2013 1510   HGB 12.8 08/13/2009 0946   HCT 36.8 01/24/2018 2159   HCT 40.5 10/12/2013 1510   HCT 36.8 08/13/2009 0946   PLT 230 01/24/2018 2159   PLT 278 10/12/2013 1510   PLT 271 08/13/2009 0946   MCV 90.2 01/24/2018 2159   MCV 87 10/12/2013 1510   MCV 87.0 08/13/2009 0946   MCH 28.7 01/24/2018 2159   MCHC 31.8 01/24/2018 2159   RDW 13.2 01/24/2018 2159   RDW 13.6 10/12/2013 1510   RDW 13.8 08/13/2009 0946   LYMPHSABS 2.1 01/24/2018 2159   LYMPHSABS 1.5 08/13/2009 0946   MONOABS 0.3 01/24/2018 2159   MONOABS 0.2 08/13/2009 0946   EOSABS 0.1 01/24/2018 2159   EOSABS 0.1 08/13/2009 0946   BASOSABS 0.1 01/24/2018 2159   BASOSABS 0.0 08/13/2009 0946    Hgb A1C Lab Results  Component Value Date   HGBA1C 7.6 (A) 07/30/2018            Assessment & Plan:   Urinary Urgency, Frequency:  Urinalysis: unable to provide urine sample Will give specimen cup to take home Push fluids  Acute on  Chronic Low Back Pain with Left Sided Sciatica:  Xray reviewed RX for Tramadol 50 mg TID prn for severe pain RX for Zanaflex 2 mg QHS prn Stretching exercises given Consider PT vs MRI  Return precautions discussed Webb Silversmith, NP

## 2018-08-14 NOTE — Telephone Encounter (Signed)
Last night pt started with pain in lower lt side and lower lt back and pain went down lt leg to foot. Pain level now is around 7. Pt does have frequency and urgency of urine. 08/13/18 and 08/14/18 FBS 185; pt taking glipizide 10 mg bid. Pt is waiting on diabetic center to call for appt to talk with pt about her diabetic diet. Last night pt ate green beans, lima beans and corn and small amt of watermelon with water to drink. No covid symptoms, no travel and no known exposure to covid. Pt does not want to go to UC and scheduled appt with Avie Echevaria NP today at 12 noon. UC or ED precautions given.

## 2018-08-16 MED ORDER — ACCU-CHEK AVIVA PLUS W/DEVICE KIT: 1.0000 | PACK | Freq: Once | 0 refills | Status: DC

## 2018-08-16 NOTE — Addendum Note (Signed)
Addended by: Lurlean Nanny on: 08/16/2018 02:44 PM   Modules accepted: Orders

## 2018-08-17 NOTE — Telephone Encounter (Signed)
Rx for glucometer and test strips sent to mail order as requested

## 2018-08-21 ENCOUNTER — Encounter: Payer: Self-pay | Admitting: Internal Medicine

## 2018-08-23 ENCOUNTER — Encounter: Payer: Self-pay | Admitting: Internal Medicine

## 2018-08-24 ENCOUNTER — Encounter: Payer: Self-pay | Admitting: Internal Medicine

## 2018-08-24 MED ORDER — LANCETS 30G MISC: 1.0000 | Freq: Two times a day (BID) | 1 refills | Status: DC

## 2018-08-27 ENCOUNTER — Telehealth: Payer: Self-pay | Admitting: *Deleted

## 2018-08-27 MED ORDER — ACCU-CHEK SOFTCLIX LANCETS MISC: 2 refills | Status: DC

## 2018-08-27 NOTE — Telephone Encounter (Signed)
softclix Rx sent to mail order

## 2018-08-27 NOTE — Telephone Encounter (Signed)
Rosie with [facility] left a voicemail stating that she is calling about the e-script that they received on 08/24/18 for lancets. Alison Murray stated that she needs to verify and confirm the brand of lancets to dispense. Alison Murray stated that patient has an accu check aviva kit which is compatable with soft click lancets. Rosie requested a call back with confirmation on which lancets to dispense? Ref #[id number]

## 2018-08-29 NOTE — Telephone Encounter (Signed)
Please sign and close encounter if completed.  

## 2018-08-30 ENCOUNTER — Encounter: Payer: Medicare Other | Attending: Internal Medicine | Admitting: Registered"

## 2018-08-30 ENCOUNTER — Encounter: Payer: Self-pay | Admitting: Registered"

## 2018-08-30 DIAGNOSIS — E119 Type 2 diabetes mellitus without complications: Secondary | ICD-10-CM

## 2018-08-30 NOTE — Progress Notes (Signed)
Diabetes Self-Management Education  Visit Type: First/Initial  Appt. Start Time: 1400 Appt. End Time: 4742  09/05/2018  [name], identified by name and date of birth, is a 63 y.o. female with a diagnosis of Diabetes: Type 2.   ASSESSMENT  There were no vitals taken for this visit. There is no height or weight on file to calculate BMI.   This visit was completed via MyChart Video due to the COVID-19 pandemic.   I spoke with Carrie Singh and verified that I was speaking with the correct person with two patient identifiers (full name and date of birth). I discussed the limitations related to this kind of visit and I informed pt that insurance would be billed for visit the same as for in person appointments. Pt was willing to proceed.   Pt was alone for visit. Pt reports having multiple health conditions. Pt has PMH of breast cancer, GERD, HLD, and HTN. Pt reports that having breast cancer twice was very hard on her. Pt reports struggles with memory at times and reports that her husband supports and helps her manage her health conditions. Pt reports that prior to having cancer she used to walk a lot. Reports now she has been walking some indoors-about 15 minutes 2 days per week. Pt reports she checks her blood sugar once daily in the morning (fasting) d/t running low on supplies. She reports her readings over the past ~week for fasting have been between 150-217. Last hgba1c was 7.6 on 07/30/18. Pt reports having some signs of hypoglycemia at times but unsure if it was d/t low blood sugar as pt has not checked blood sugar while having these symptoms. Pt reports she has been trying to cut back on carbohydrates. Pt reports she was previously taking Metformin but was taken off d/t concerns about effects on her kidneys. Pt reports that since going off of Metformin and being on Glipizide her blood sugar has been elevated compared with how it was previously.   Diabetes Self-Management Education -  08/30/18 1429      Visit Information   Visit Type  First/Initial      Initial Visit   Diabetes Type  Type 2    Are you currently following a meal plan?  No    Are you taking your medications as prescribed?  Yes    Date Diagnosed  12-13 years      Health Coping   How would you rate your overall health?  Poor;Fair;Good      Psychosocial Assessment   Patient Belief/Attitude about Diabetes  Motivated to manage diabetes    Self-care barriers  Other (comment)   Reports trouble with memory.   Self-management support  Family   Reports husband helps with managing diabetes   Other persons present  Patient    Patient Concerns  Nutrition/Meal planning;Glycemic Control    Special Needs  Unable to determine    Preferred Learning Style  No preference indicated    Learning Readiness  Ready    How often do you need to have someone help you when you read instructions, pamphlets, or other written materials from your doctor or pharmacy?  3 - Sometimes    What is the last grade level you completed in school?  1.5 years college      Pre-Education Assessment   Patient understands the diabetes disease and treatment process.  Needs Instruction    Patient understands incorporating nutritional management into lifestyle.  Needs Instruction    Patient undertands  incorporating physical activity into lifestyle.  Needs Instruction    Patient understands using medications safely.  Demonstrates understanding / competency    Patient understands monitoring blood glucose, interpreting and using results  Needs Instruction    Patient understands prevention, detection, and treatment of acute complications.  Needs Instruction    Patient understands prevention, detection, and treatment of chronic complications.  Needs Instruction    Patient understands how to develop strategies to address psychosocial issues.  Needs Instruction    Patient understands how to develop strategies to promote health/change behavior.  Needs  Instruction      Complications   Last HgB A1C per patient/outside source  7.6 %    How often do you check your blood sugar?  1-2 times/day   Reports she has only been checking fasting lately d/t needing to get more lancets   Fasting Blood glucose range (mg/dL)  130-179;>200;180-200   Over past week has ranged from 150-217   Postprandial Blood glucose range (mg/dL)  --   Pt is not currently checking after meals.   Number of hypoglycemic episodes per month  --   Pt unsure.   Number of hyperglycemic episodes per week  4    Have you had a dilated eye exam in the past 12 months?  Yes    Have you had a dental exam in the past 12 months?  No    Are you checking your feet?  Yes    How many days per week are you checking your feet?  7      Dietary Intake   Breakfast  74-08 Noon: sliced cucumber, 1 boiled egg, 3 strawberries, 1 piece cinnamon and raisin bread toasted, butter, half banana, small amount of chicken or sandwich meat    Snack (morning)  --    Snack (afternoon)  grapes, black cherries, restaurant style chips 2 PM    Dinner  6-630 PM: green beans, white potatoes, boneless pork chop baked    Snack (evening)  8PM: grapes, black cherries, chips, nuts; 10PM: watermelon    Beverage(s)  ~3-4 bottles of water (48-64 oz )      Exercise   Exercise Type  Light (walking / raking leaves);ADL's    How many days per week to you exercise?  2    How many minutes per day do you exercise?  15    Total minutes per week of exercise  30      Patient Education   Previous Diabetes Education  No    Disease state   Definition of diabetes, type 1 and 2, and the diagnosis of diabetes;Factors that contribute to the development of diabetes    Nutrition management   Carbohydrate counting;Food label reading, portion sizes and measuring food.;Role of diet in the treatment of diabetes and the relationship between the three main macronutrients and blood glucose level;Reviewed blood glucose goals for pre and post  meals and how to evaluate the patients' food intake on their blood glucose level.    Physical activity and exercise   Role of exercise on diabetes management, blood pressure control and cardiac health.    Monitoring  Taught/discussed recording of test results and interpretation of SMBG.;Interpreting lab values - A1C, lipid, urine microalbumina.;Identified appropriate SMBG and/or A1C goals.;Daily foot exams;Yearly dilated eye exam    Acute complications  Taught treatment of hypoglycemia - the 15 rule.    Chronic complications  Lipid levels, blood glucose control and heart disease;Dental care;Retinopathy and reason for yearly dilated eye  exams      Individualized Goals (developed by patient)   Nutrition  Follow meal plan discussed;General guidelines for healthy choices and portions discussed    Physical Activity  Exercise 3-5 times per week   Add 5 minutes of PA to days not currently active and gradually add more time.   Medications  take my medication as prescribed    Monitoring   test my blood glucose as discussed    Reducing Risk  treat hypoglycemia with 15 grams of carbs if blood glucose less than 70mg /dL;examine blood glucose patterns      Post-Education Assessment   Patient understands the diabetes disease and treatment process.  Demonstrates understanding / competency    Patient understands incorporating nutritional management into lifestyle.  Demonstrates understanding / competency    Patient undertands incorporating physical activity into lifestyle.  Demonstrates understanding / competency    Patient understands using medications safely.  Demonstrates understanding / competency    Patient understands monitoring blood glucose, interpreting and using results  Demonstrates understanding / competency    Patient understands prevention, detection, and treatment of acute complications.  Needs Instruction    Patient understands prevention, detection, and treatment of chronic complications.  Needs  Instruction    Patient understands how to develop strategies to address psychosocial issues.  Demonstrates understanding / competency    Patient understands how to develop strategies to promote health/change behavior.  Demonstrates understanding / competency      Outcomes   Expected Outcomes  Demonstrated interest in learning. Expect positive outcomes    Future DMSE  Other (comment)   2-3 weeks   Program Status  Not Completed   Will complete last half of ADA book and review nutrition education again at f/u visit.      Individualized Plan for Diabetes Self-Management Training:   Learning Objective:  Patient will have a greater understanding of diabetes self-management. Patient education plan is to attend individual and/or group sessions per assessed needs and concerns.   Plan: Dietitian provided DSME. Provided education regarding relationship between insulin resistance/blood sugar management and dietary intake and carbohydrate counting. Emphasized importance of consistent meals and carbohydrate intake. Will cover last half of DSME booklet on relationship between diabetes and additional chronic diseases and review nutrition information covered at this visit at f/u visit.   Patient Instructions  Instructions/Goals:   Make sure to get in three meals per day. Try to have balanced meals like the plate example (see handout).   Eat balanced foods every 3-5 hours while awake to help balance blood sugar. Avoid eating too close to bedtime (3 hours before laying down).   Recommend including 2-3 carbohydrate choices (30-45 g carbohydrates) per meal  If having a carbohydrate as a snack such as fruit-pair it with protein for balance (see snack sheet)   If feeling signs of low blood sugar (see page 12) check blood blood sugar and follow 15-15 rule if blood sugar is low as discussed (page 13)  For physical activity: recommend gradually increasing-may add 5 minutes to days you are not currently  including activity and gradually add to that time.     Expected Outcomes:  Demonstrated interest in learning. Expect positive outcomes  Education material provided: ADA - How to Thrive: A Guide for Your Journey with Diabetes, My Plate and Snack sheet, Planning Healthy Meals foldout    If problems or questions, patient to contact team via:  Phone and Email  Future DSME appointment: Other (comment)(2-3 weeks)

## 2018-08-31 NOTE — Patient Instructions (Signed)
Instructions/Goals:   Make sure to get in three meals per day. Try to have balanced meals like the plate example (see handout).   Eat balanced foods every 3-5 hours while awake to help balance blood sugar. Avoid eating too close to bedtime (3 hours before laying down).   Recommend including 2-3 carbohydrate choices (30-45 g carbohydrates) per meal  If having a carbohydrate as a snack such as fruit-pair it with protein for balance (see snack sheet)   If feeling signs of low blood sugar (see page 12) check blood blood sugar and follow 15-15 rule if blood sugar is low as discussed (page 13)  For physical activity: recommend gradually increasing-may add 5 minutes to days you are not currently including activity and gradually add to that time.

## 2018-09-05 ENCOUNTER — Telehealth: Payer: Medicare Other | Admitting: Registered"

## 2018-09-17 ENCOUNTER — Other Ambulatory Visit: Payer: Self-pay | Admitting: Internal Medicine

## 2018-09-17 DIAGNOSIS — E119 Type 2 diabetes mellitus without complications: Secondary | ICD-10-CM

## 2018-09-20 ENCOUNTER — Ambulatory Visit (INDEPENDENT_AMBULATORY_CARE_PROVIDER_SITE_OTHER): Payer: Medicare Other

## 2018-09-20 DIAGNOSIS — Z23 Encounter for immunization: Secondary | ICD-10-CM

## 2018-09-25 ENCOUNTER — Ambulatory Visit
Admission: RE | Admit: 2018-09-25 | Discharge: 2018-09-25 | Disposition: A | Discharge status: Home / Self Care | Payer: Medicare Other | Source: Ambulatory Visit | Attending: Adult Health | Admitting: Adult Health

## 2018-09-25 ENCOUNTER — Other Ambulatory Visit: Payer: Self-pay

## 2018-09-25 DIAGNOSIS — Z1231 Encounter for screening mammogram for malignant neoplasm of breast: Secondary | ICD-10-CM

## 2018-09-26 ENCOUNTER — Encounter: Payer: Medicare Other | Attending: Internal Medicine | Admitting: Registered"

## 2018-09-26 DIAGNOSIS — E119 Type 2 diabetes mellitus without complications: Secondary | ICD-10-CM | POA: Insufficient documentation

## 2018-09-26 NOTE — Progress Notes (Signed)
Diabetes Self-Management Education  Visit Type:    Appt. Start Time: 0800 Appt. End Time: 0900  09/26/2018  [name], identified by name and date of birth, is a 63 y.o. female with a diagnosis of Diabetes:  .   ASSESSMENT  There were no vitals taken for this visit. There is no height or weight on file to calculate BMI.   This visit was completed via MyChart Video due to the COVID-19 pandemic.   I spoke with Carrie Singh and verified that I was speaking with the correct person with two patient identifiers (full name and date of birth).    Pt was alone for visit. Pt has PMH of breast cancer, GERD, HLD, and HTN.   Pt reports things have been up and down. Pt reports she is very concerned because her blood sugar remains high. Pt reports most have been above 200 and reports that yesterday morning her fasting was 325. Pt reports she usually checks in the morning and 2-3 hours after meals. Pt had not checked yet this morning. Pt reports she is concerned because her readings have been averaging over 200 since first of this year. Pt reports she has been eating salad often with meals. From report, the salads pt eats are usually low in carbohydrates, at most containing 2 carbohydrate choices per pt's report. Pt reports she did have one salad yesterday which had fruit in it but typically her salads include a protein (chicken or tuna) and vegetables, sometimes she will have a pack with 3 crackers with them. Pt also reports she has been trying to eat at regular intervals-3 meals every ~4 hours. Pt reports she has still been trying to include physical activity by moving/walking around in her house.   24-Hour Recall: Meal #1 (~11-12 AM): 1 cucumber, 1 boiled egg, water (0 g CHO) Snk: None reported.  Meal #2 (~4 PM): Chick [employer]-half chicken wrap, half small fry (~30 g CHO, unsure about dressing added) Meal #3 (7-8 PM): Chick [employer] Market Salad (~30 g CHO or less) Snk: 2 teaspoons cherry yum yum  dessert with pretzels and nuts (unsure of CHO amount)   Individualized Plan for Diabetes Self-Management Training:   Learning Objective:  Patient will have a greater understanding of diabetes self-management. Patient education plan is to attend individual and/or group sessions per assessed needs and concerns.   Plan: Dietitian reviewed carbohydrate counting. Dietitian discussed pt seeing a endocrinologist d/t pt's concerns with blood sugar average remaining >200. From intake reported, pt's blood sugars have remained very elevated despite most meals reported containing 30 g or less of carbohydrates. Pt reports she would like to see an endocrinologist with [facility] and requested dietitian let pt's PCP know. Dietitian let pt know pt would need to also contact her PCP to get a referral. Dietitian recommended pt continue with consistent eating plan and recommended pt include a consistent amount of carbohydrates at each meal. Recommended 2 carbohydrate choices (30 g CHO) per meal at this time and also recommended pt track foods and blood sugar (fasting and 1-2 hours after at least 1 meal per day) to help see effects of pt's intake on blood sugar. Pt reports she would like to have meal ideas containing recommended carbohydrate amount. Dietitian discussed breakfast ideas with pt and advised pt dietitian would send ideas for other meals as well. Pt appeared agreeable to information, goals discussed.   Instructions/Goals:   Make sure to get in three meals per day. Try to have  balanced meals like the plate example (see handout).   Continue working to eat balanced foods every 3-5 hours while awake to help balance blood sugar. Avoid eating too close to bedtime (3 hours before laying down).   Recommend including 2 carbohydrate choices (30 g carbohydrates) per meal  If having a carbohydrate as a snack such as fruit-pair it with protein for balance (see snack sheet from previous visit)   See handout with meal  ideas included.   Recommend logging food intake and blood sugar  Check fasting each day and at least 1 time 1-2 hours after a meal.  Write down foods for each meal and snack and time and reading each time you check your blood sugar  For physical activity: recommend gradually increasing-may add 5 minutes to days you are not currently including activity and gradually add to that time.   Recommend calling your primary doctor to ask about referral to see an endocrinologist regarding blood sugar concerns.   Patient Instructions  Instructions/Goals:   Make sure to get in three meals per day. Try to have balanced meals like the plate example (see handout).   Continue working to eat balanced foods every 3-5 hours while awake to help balance blood sugar. Avoid eating too close to bedtime (3 hours before laying down).   Recommend including 2 carbohydrate choices (30 g carbohydrates) per meal  If having a carbohydrate as a snack such as fruit-pair it with protein for balance (see snack sheet from previous visit)   See handout with meal ideas included.   Recommend logging food intake and blood sugar  Check fasting each day and at least 1 time 1-2 hours after a meal.  Write down foods for each meal and snack and time and reading each time you check your blood sugar  For physical activity: recommend gradually increasing-may add 5 minutes to days you are not currently including activity and gradually add to that time.   Recommend calling your primary doctor to ask about referral to see an endocrinologist regarding blood sugar concerns.       Expected Outcomes:     Education material provided: Meal Ideas sheet.   If problems or questions, patient to contact team via:  Phone and Email  Future DSME appointment:  2 weeks

## 2018-09-28 ENCOUNTER — Encounter: Payer: Self-pay | Admitting: Registered"

## 2018-09-28 NOTE — Patient Instructions (Addendum)
Instructions/Goals:   Make sure to get in three meals per day. Try to have balanced meals like the plate example (see handout).   Continue working to eat balanced foods every 3-5 hours while awake to help balance blood sugar. Avoid eating too close to bedtime (3 hours before laying down).   Recommend including 2 carbohydrate choices (30 g carbohydrates) per meal  If having a carbohydrate as a snack such as fruit-pair it with protein for balance (see snack sheet from previous visit)   See handout with meal ideas included.   [email or web address] good resource for looking up nutrition information at restaurants   Recommend logging food intake and blood sugar  Check fasting each day and at least 1 time 1-2 hours after a meal.  Write down foods for each meal and snack and time and reading each time you check your blood sugar  For physical activity: recommend gradually increasing-may add 5 minutes to days you are not currently including activity and gradually add to that time.   Recommend calling your primary doctor to ask about referral to see an endocrinologist regarding blood sugar concerns.

## 2018-09-30 ENCOUNTER — Encounter: Payer: Self-pay | Admitting: Internal Medicine

## 2018-09-30 DIAGNOSIS — E119 Type 2 diabetes mellitus without complications: Secondary | ICD-10-CM

## 2018-10-03 ENCOUNTER — Other Ambulatory Visit: Payer: Self-pay | Admitting: Internal Medicine

## 2018-10-03 ENCOUNTER — Telehealth: Payer: Self-pay

## 2018-10-03 NOTE — Telephone Encounter (Signed)
-----   Message from Jearld Fenton, NP sent at 10/03/2018 12:43 PM EDT ----- Regarding: FW: Pt requesting referral Can you call pt and let her know I have placed referral to endocrinology ----- Message ----- From: Leota Sauers, RD Sent: 09/27/2018   8:43 AM EDT To: Jearld Fenton, NP Subject: Pt requesting referral                         [facility],  I hope you are doing well. I saw one of your patients, Carrie Singh, yesterday. [name] has been working to make dietary changes and we are still working on a more consistent intake, but her blood sugar still remains very high (most readings above 200, some over 300 per her report). She is very concerned about her blood sugar remaining elevated and we discussed her seeing an endocrinologist. I let her know that she would need to contact you to request a referral to see an endocrinologist which she plans to do. She asked that I let you know as well and that she would like to be referred to [facility] on [facility] to stay with the [place] group.   Thank you,   [name], MS, RDN, LDN

## 2018-10-05 ENCOUNTER — Telehealth: Payer: Self-pay | Admitting: Registered"

## 2018-10-05 NOTE — Telephone Encounter (Signed)
On 10/05/2018 at 12:45 PM, Dietitian sent the following response via email:   [name],   Thank you for sending over some of your recorded meals and blood sugar. On the nights your blood sugar is in the 300s, do you feel any different those days overall? If you have any recorded dinners from those days that would be great to see as well.   I am glad to see that your blood sugar 2 hours after lunch was not far from goal of less than 180. You are doing a good job having 2-3 carbohydrate choices at meals. For the plain nuts, those alone don't contain carbohydrates, only protein. If you have any additional food logs you are welcome to send them over whenever. These logs can provide [place] with a lot of good information about how your intake affects your blood sugar readings.    I would recommend waiting to meet with the endocrinologist and consult with them before starting the new supplement. My main concern is that supplement can interact with some medications including your beta blocker medication for blood pressure and also herbal supplements are not regulated by the FDA so there is a risk that they can contain contaminants. I saw that your primary care physician placed your referral so someone from [facility] should be calling about an appointment before long.   Regards,  Carrie Singh    [name], MS, RDN, [place] Registered Dietitian I Phone: [phone]  Fax: [phone] Website: [email or web address]      On 10/05/2018 at 11:14 AM, patient sent the following message to dietitian via email:   Hi [name], I pray you are doing well.  My blood sugar is still not doing well. I purchased some vitamins " CHROMIUM PICOLINATE" I just got them along with diabetic snack bars & boost drinks which is good if I miss a meal. I am so frustrated with the meals as well as my B/S readings. Some nights before bed my B/S is 353 when I dont eat anything  after supper.  Sorry this took so long to send. I have more I just didnt send. I would have to clean it up so you are able to read.  My doctor is in the process of finding an Endocrinolist, I'm still waiting. Blessings, Carrie Singh

## 2018-10-09 ENCOUNTER — Other Ambulatory Visit: Payer: Self-pay | Admitting: Internal Medicine

## 2018-10-11 ENCOUNTER — Encounter: Payer: Medicare Other | Admitting: Registered"

## 2018-10-11 DIAGNOSIS — E119 Type 2 diabetes mellitus without complications: Secondary | ICD-10-CM

## 2018-10-12 ENCOUNTER — Telehealth: Payer: Self-pay | Admitting: Registered"

## 2018-10-12 NOTE — Telephone Encounter (Signed)
On 9/25 dietitian sent the following message to pt via email:   Carrie Singh,  I hope you are doing well. I just wanted to send over the information we discussed at last appointment on 09/24 so you would have it sooner than it will get to you by mail.   Instructions/Goals:  -Continue working to eat consistently 3 meals/every 3-5 hours. -Continue working to have consistent amount of carbohydrates at each meal: 2-3 carbohydrate choices per meal (30-45 g carbohydrates) -For snacks, recommend 1 carbohydrate choice or less and including a protein. Raw vegetables can make great snacks as well if you are wanting to snack soon after eating.   -Avoid lying down within 3 hours of eating  -Continue tracking foods and blood sugar and want to pay special attention to evening snacks and fasting blood sugar the next day to look for patterns. Recommend trying different types of snacks (1 carbohydrate choice + protein vs protein only vs carbohydrate only) and seeing if fasting blood sugar improves.  Regards,  Magally Vahle    [name], MS, RDN, [place] Registered Dietitian I Phone: [phone]  Fax: [phone] Website: [email or web address]

## 2018-10-17 NOTE — Progress Notes (Signed)
Diabetes Self-Management Education  Visit Type:    Appt. Start Time: 0810 Appt. End Time: 0900  10/11/2018  [name], identified by name and date of birth, is a 63 y.o. female with a diagnosis of Diabetes:  .   ASSESSMENT  There were no vitals taken for this visit. There is no height or weight on file to calculate BMI.   This visit was completed via phone due to COVID-19 pandemic. Pt was going to complete visit via MyChart, however, pt experienced wifi difficulties which prevented pt from being able to log into MyChart. I spoke with Carrie Singh and verified that I was speaking with the correct person with two patient identifiers (full name and date of birth).    Pt was alone for visit with husband sometimes heard in the background. Pt has PMH of breast cancer, GERD, HLD, and HTN.   Pt reports that most of her blood sugars have remained ~200s, but she has had less in the 300s since last visit. Reports she and her husband started walking last week for 30 minutes most days and she has not had a blood sugar in the 300s since they started their walking routine. Reports fastings over past week: ~187-212 and post prandial: 178-322. No reports of any low blood sugars. Pt took blood sugar during appointment fasting and it was 205. Pt reports the following 24-hour recall on following day:  Breakfast (1030 AM): 1 cup raisin bran, 1/2 banana Snk: None reported.  Lunch (2-230 PM): cabbage, meat loaf, corn, small piece of corn bread (postprandial was 207) Snk: nuts Dinner (7PM): 1/2 sub with chicken, provolone, peppers, 6 fries Snk: chips (4), water  Snk: Klondike bar reduced sugar; nuts   Individualized Plan for Diabetes Self-Management Training:   Learning Objective:  Patient will have a greater understanding of diabetes self-management. Patient education plan is to attend individual and/or group sessions per assessed needs and concerns.   Plan: Dietitian discussed working to have a  more consistent intake of carbohydrates to help make blood sugar more consistent as well. Discussed continuing to keep food and BG log. Pt appeared agreeable to information, goals discussed.   Nutrition Goals/Recommendations:   -Continue working to eat consistently 3 meals/every 3-5 hours.  -Continue working to have consistent amount of carbohydrates at each meal: 2-3 carbohydrate choices per meal (30-45 g carbohydrates)  -For snacks, recommend 1 carbohydrate choice or less and including a protein. Raw vegetables can make great snacks as well if you are wanting to snack soon after eating.   -Avoid lying down within 3 hours of eating   -Continue tracking foods and blood sugar and want to pay special attention to evening snacks and fasting blood sugar the next day to look for patterns. Recommend trying different types of snacks (1 carbohydrate choice + protein vs protein only vs carbohydrate only) and seeing if fasting blood sugar improves.  Patient Instructions  Nutrition Goals/Recommendations:   -Continue working to eat consistently 3 meals/every 3-5 hours.  -Continue working to have consistent amount of carbohydrates at each meal: 2-3 carbohydrate choices per meal (30-45 g carbohydrates)  -For snacks, recommend 1 carbohydrate choice or less and including a protein. Raw vegetables can make great snacks as well if you are wanting to snack soon after eating.   -Avoid lying down within 3 hours of eating   -Continue tracking foods and blood sugar and want to pay special attention to evening snacks and fasting blood sugar the next day to look  for patterns. Recommend trying different types of snacks (1 carbohydrate choice + protein vs protein only vs carbohydrate only) and seeing if fasting blood sugar improves.  Expected Outcomes:     Education material provided:   If problems or questions, patient to contact team via:  Phone and Email  Future DSME appointment:  1 month.

## 2018-10-18 NOTE — Patient Instructions (Addendum)
Nutrition Goals/Recommendations:   -Continue working to eat consistently 3 meals/every 3-5 hours.  -Continue working to have consistent amount of carbohydrates at each meal: 2-3 carbohydrate choices per meal (30-45 g carbohydrates)  -For snacks, recommend 1 carbohydrate choice or less and including a protein. Raw vegetables can make great snacks as well if you are wanting to snack soon after eating.   -Avoid lying down within 3 hours of eating   -Continue tracking foods and blood sugar and want to pay special attention to evening snacks and fasting blood sugar the next day to look for patterns. Recommend trying different types of snacks (1 carbohydrate choice + protein vs protein only vs carbohydrate only) and seeing if fasting blood sugar improves.

## 2018-10-19 ENCOUNTER — Encounter: Payer: Self-pay | Admitting: Internal Medicine

## 2018-10-19 ENCOUNTER — Other Ambulatory Visit: Payer: Self-pay | Admitting: Internal Medicine

## 2018-10-19 ENCOUNTER — Telehealth: Payer: Self-pay | Admitting: Registered"

## 2018-10-19 NOTE — Telephone Encounter (Signed)
On 10/19/18, dietitian sent the following reply via email to pt along with AND food and activity log sheet:   Hi Taneia,  I am so glad to hear you are seeing more numbers within goal range! Do you remember which days you went on a walk? When you take your blood sugar 2 hours after dinner, is that before or after your evening walk? I am wondering if the days your blood sugar was lower 2 hours after dinner was after you went on your walk or unrelated.   I have a food and activity log attached. There's not a specific place to write blood sugar on this log, but you can write it in under the meal and tell when you took it in relation to the meal.   That is great that you are staying active! You should feel great about all of your progress and hard work!    Thank you,  Laurance Flatten, MS, RDN, [place] Registered Dietitian I Phone: [phone]  Fax: [phone] Website: [email or web address]     On 10/18/18, pt emailed the following messages along with food and blood glucose log to dietitian:   Hi Ethyle Tiedt: I'm so sorry this may be a little tacky, but I'm trying to keep track. Hopefully you can send me track sheets & i will make copies for future references.. This is a huge task for me but I'm striving to help myself.  I was so excited on 9/30 my #'s were 129 AM fasting as well as 2-3 hrs after eating. Thanks for all your help. Blessings, Charley Goh Ps. I underlined my #'s for each day, tastings a.m. & 2-3 hrs after supper.  I only walked 3 days this week. Hopefully I'll do more as my body feels well & the weather permits. Also I am moving around more cleaning our home. Blessings, Ansley Fiorenza p.s. I am still working on how to count the carbs & measure out everything. It's a process but I will keep working on it without the information you have given me.  I thank God I havent seen no more 300 's readings & I'm working on  bringing my #'s down in morning fasting to under 120. I got so excited when I sat 129. It's a hard working process, like a new life change.

## 2018-10-23 ENCOUNTER — Encounter: Payer: Self-pay | Admitting: Internal Medicine

## 2018-10-29 ENCOUNTER — Other Ambulatory Visit: Payer: Self-pay

## 2018-10-31 ENCOUNTER — Encounter: Payer: Self-pay | Admitting: Endocrinology

## 2018-10-31 ENCOUNTER — Ambulatory Visit (INDEPENDENT_AMBULATORY_CARE_PROVIDER_SITE_OTHER): Payer: Medicare Other | Admitting: Endocrinology

## 2018-10-31 ENCOUNTER — Telehealth: Payer: Self-pay

## 2018-10-31 ENCOUNTER — Other Ambulatory Visit: Payer: Self-pay

## 2018-10-31 VITALS — BP 100/60 | HR 75 | Ht 65.25 in | Wt 230.8 lb

## 2018-10-31 DIAGNOSIS — E119 Type 2 diabetes mellitus without complications: Secondary | ICD-10-CM

## 2018-10-31 LAB — POCT GLYCOSYLATED HEMOGLOBIN (HGB A1C): Hemoglobin A1C: 8.6 % — AB (ref 4.0–5.6)

## 2018-10-31 MED ORDER — CANAGLIFLOZIN 300 MG PO TABS: 300.0000 mg | ORAL_TABLET | Freq: Every day | ORAL | 11 refills | Status: DC

## 2018-10-31 NOTE — Telephone Encounter (Signed)
Pt called to state that she picked up the Invokana Rx and read the literature provided along with her medication. States, in large print, "Taking this medication may result in amputation". Pt expressed concern, stating "I don't want to take this medication", and "why would he prescribe something like this if I had surgery in the past resulting in numbness to my extremity". Advised I would send Dr. Loanne Drilling her concerns for him to review. Will call back with his professional opinion regarding literature that she had read.

## 2018-10-31 NOTE — Patient Instructions (Addendum)
good diet and exercise significantly improve the control of your diabetes.  please let me know if you wish to be referred to a dietician.  high blood sugar is very risky to your health.  you should see an eye doctor and dentist every year.  It is very important to get all recommended vaccinations.  Controlling your blood pressure and cholesterol drastically reduces the damage diabetes does to your body.  Those who smoke should quit.  Please discuss these with your doctor.  check your blood sugar once a day.  vary the time of day when you check, between before the 3 meals, and at bedtime.  also check if you have symptoms of your blood sugar being too high or too low.  please keep a record of the readings and bring it to your next appointment here (or you can bring the meter itself).  You can write it on any piece of paper.  please call us sooner if your blood sugar goes below 70, or if you have a lot of readings over 200.  I have sent a prescription to your pharmacy, to add "Invokana."   Please come back for a follow-up appointment in 2 months.     Bariatric Surgery You have so much to gain by losing weight.  You may have already tried every diet and exercise plan imaginable.  And, you may have sought advice from your family physician, too.   Sometimes, in spite of such diligent efforts, you may not be able to achieve long-term results by yourself.  In cases of severe obesity, bariatric or weight loss surgery is a proven method of achieving long-term weight control.  Our Services Our bariatric surgery programs offer our patients new hope and long-term weight-loss solution.  Since introducing our services in 2003, we have conducted more than 2,400 successful procedures.  Our program is designated as a Programmer, multimedia by the Metabolic and Bariatric Surgery Accreditation and Quality Improvement Program (MBSAQIP), a IT trainer that sets rigorous patient safety and outcome standards.  Our  program is also designated as a Ecologist by [employer].   Our exceptional weight-loss surgery team specializes in diagnosis, treatment, follow-up care, and ongoing support for our patients with severe weight loss challenges.  We currently offer laparoscopic sleeve gastrectomy, gastric bypass, and adjustable gastric band (LAP-BAND).    Attend our [facility] Choosing to undergo a bariatric procedure is a big decision, and one that should not be taken lightly.  You now have two options in how you learn about weight-loss surgery - in person or online.  Our objective is to ensure you have all of the information that you need to evaluate the advantages and obligations of this life changing procedure.  Please note that you are not alone in this process, and our experienced team is ready to assist and answer all of your questions.  There are several ways to register for a seminar (either on-line or in person): 1)  Call [phone] 2) Go on-line to [facility] and register for either type of seminar.  [email or web address]

## 2018-10-31 NOTE — Progress Notes (Signed)
Subjective:    Patient ID: Carrie Singh, female    DOB: Jun 15, 1955, 63 y.o.   MRN: [id number]  HPI pt is referred by Webb Silversmith, NP, for diabetes.  Pt states DM was dx'ed in 2012; she has mild if any neuropathy of the lower extremities; she is unaware of any associated chronic complications; she has never been on insulin; pt says her diet and exercise are good; she has never had GDM, pancreatitis, pancreatic surgery, severe hypoglycemia or DKA.   She stopped metformin (renal insuff).  She takes glipizide.  She says cbg varies from 106-398.   Past Medical History:  Diagnosis Date  . Breast cancer ([place])   . Breast cancer, right breast ([place]) 12/2007   a. s/p chemo, radiation, and lumpectomy with negative sentinel lymph node biopsy   . Depression   . Diabetes mellitus without complication ([place])   . GERD (gastroesophageal reflux disease)   . Hyperlipidemia   . Hypertension    resistant, negative renal Dopplers and negative CT angiogram  . Migraines   . Obesity   . Seizures ([place])    last seizure  1 week ago: states she jumps for a second and it's gone    Past Surgical History:  Procedure Laterality Date  . ABDOMINAL HYSTERECTOMY    . BREAST LUMPECTOMY    . CYST EXCISION    . FOOT SURGERY    . MASTECTOMY W/ SENTINEL NODE BIOPSY Right 03/18/2016   Procedure: RIGHT MASTECTOMY WITH RIGHT AXILLARY SENTINEL LYMPH NODE BIOPSY;  Surgeon: Alphonsa Overall, MD;  Location: [facility];  Service: General;  Laterality: Right;  . ovary tumor    . vocal cord nodules      Social History   Socioeconomic History  . Marital status: Married    Spouse name: [name]  . Number of children: Not on file  . Years of education: 12+  . Highest education level: Not on file  Occupational History  . Occupation: [employer]  Social Needs  . Financial resource strain: Not on file  . Food insecurity    Worry: Not on file    Inability: Not on file  . Transportation needs    Medical: Not on file    Non-medical: Not on  file  Tobacco Use  . Smoking status: Never Smoker  . Smokeless tobacco: Never Used  Substance and Sexual Activity  . Alcohol use: No    Alcohol/week: 0.0 standard drinks  . Drug use: No  . Sexual activity: Yes  Lifestyle  . Physical activity    Days per week: Not on file    Minutes per session: Not on file  . Stress: Not on file  Relationships  . Social Herbalist on phone: Not on file    Gets together: Not on file    Attends religious service: Not on file    Active member of club or organization: Not on file    Attends meetings of clubs or organizations: Not on file    Relationship status: Not on file  . Intimate partner violence    Fear of current or ex partner: Not on file    Emotionally abused: Not on file    Physically abused: Not on file    Forced sexual activity: Not on file  Other Topics Concern  . Not on file  Social History Narrative   Lives at home with husband.   Caffeine use: 1-2 drinks per month  Epworth Sleepiness Scale = 15 (as of 01/01/15)    Current Outpatient Medications on File Prior to Visit  Medication Sig Dispense Refill  . Accu-Chek Softclix Lancets lancets Use 2 times daily 200 each 2  . anastrozole (ARIMIDEX) 1 MG tablet Take 1 tablet (1 mg total) by mouth daily. 90 tablet 3  . Aspirin-Acetaminophen-Caffeine (GOODY HEADACHE PO) Take 1 packet by mouth as needed (for migraines).     . Biotin w/ Vitamins C & E (HAIR SKIN & NAILS GUMMIES PO) Take 1 tablet by mouth daily.     . Blood Glucose Monitoring Suppl (ACCU-CHEK AVIVA PLUS) w/Device KIT USE ONCE DAILY (Patient taking differently: 1 each by Other route 2 (two) times daily. ) 1 kit 0  . glipiZIDE (GLUCOTROL) 10 MG tablet TAKE 1 TABLET BY MOUTH  TWICE DAILY BEFORE A MEAL 180 tablet 0  . glucose blood (ACCU-CHEK AVIVA PLUS) test strip 1 each by Other route 2 (two) times a day. Use as instructed 200 each 2  . hydrALAZINE (APRESOLINE) 100 MG tablet TAKE 1 TABLET BY MOUTH 3  TIMES  DAILY 270 tablet 3  . hydrochlorothiazide (HYDRODIURIL) 12.5 MG tablet TAKE 1 TABLET BY MOUTH  DAILY 90 tablet 0  . irbesartan (AVAPRO) 300 MG tablet Take 1 tablet (300 mg total) by mouth daily. 90 tablet 3  . Multiple Vitamins-Minerals (ONE-A-DAY WOMENS VITACRAVES) CHEW Chew 1 tablet by mouth daily.     . pravastatin (PRAVACHOL) 40 MG tablet TAKE 1 TABLET BY MOUTH  DAILY 90 tablet 0  . tizanidine (ZANAFLEX) 2 MG capsule Take 1 capsule (2 mg total) by mouth at bedtime as needed for muscle spasms. 15 capsule 0  . atenolol (TENORMIN) 25 MG tablet Take 1 tablet (25 mg total) by mouth 2 (two) times daily. 180 tablet 3   No current facility-administered medications on file prior to visit.     No Known Allergies  Family History  Problem Relation Age of Onset  . Stroke Mother   . Heart disease Father   . Asthma Father   . Cancer Father        colon  . Cancer Sister        lung  . Cancer Brother        colon  . Diabetes Brother   . Breast cancer Other 48    BP 100/60 (BP Location: Left Arm, Patient Position: Sitting, Cuff Size: Large)   Pulse 75   Ht 5' 5.25" (1.657 m)   Wt 230 lb 12.8 oz (104.7 kg)   SpO2 96%   BMI 38.11 kg/m   Review of Systems denies blurry vision, headache, chest pain, sob, n/v, urinary frequency, muscle cramps, excessive diaphoresis, memory loss, depression, cold intolerance, rhinorrhea, and easy bruising.  She has gained weight.       Objective:   Physical Exam VS: see vs page GEN: no distress HEAD: head: no deformity eyes: no periorbital swelling, no proptosis external nose and ears are normal NECK: supple, thyroid is not enlarged CHEST WALL: no deformity LUNGS: clear to auscultation CV: reg rate and rhythm, no murmur ABD: abdomen is soft, nontender.  no hepatosplenomegaly.  not distended.  no hernia MUSCULOSKELETAL: muscle bulk and strength are grossly normal.  no obvious joint swelling.  gait is normal and steady EXTEMITIES: no deformity.  no  ulcer on the feet.  feet are of normal color and temp.  no edema.   PULSES: dorsalis pedis intact bilat.  no carotid bruit NEURO:  cn  2-12 grossly intact.   readily moves all 4's.  sensation is intact to touch on the feet SKIN:  Normal texture and temperature.  No rash or suspicious lesion is visible.  Old healed surgical scar on the right foot (bunionectomy).   NODES:  None palpable at the neck.   PSYCH: alert, well-oriented.  Does not appear anxious nor depressed.    Lab Results  Component Value Date   CREATININE 1.18 01/30/2018   BUN 20 01/30/2018   NA 140 01/30/2018   K 4.1 01/30/2018   CL 104 01/30/2018   CO2 29 01/30/2018   Lab Results  Component Value Date   HGBA1C 8.6 (A) 10/31/2018   I have reviewed outside records, and summarized: Pt was noted to have elevated a1c, and referred here.  She was also see for visit with dietician.  Pt reported widely varying cbg's.       Assessment & Plan:  Type 2 DM: she needs increased rx Obesity, new to me  Patient Instructions  good diet and exercise significantly improve the control of your diabetes.  please let me know if you wish to be referred to a dietician.  high blood sugar is very risky to your health.  you should see an eye doctor and dentist every year.  It is very important to get all recommended vaccinations.  Controlling your blood pressure and cholesterol drastically reduces the damage diabetes does to your body.  Those who smoke should quit.  Please discuss these with your doctor.  check your blood sugar once a day.  vary the time of day when you check, between before the 3 meals, and at bedtime.  also check if you have symptoms of your blood sugar being too high or too low.  please keep a record of the readings and bring it to your next appointment here (or you can bring the meter itself).  You can write it on any piece of paper.  please call us sooner if your blood sugar goes below 70, or if you have a lot of readings over  200.  I have sent a prescription to your pharmacy, to add "Invokana."   Please come back for a follow-up appointment in 2 months.     Bariatric Surgery You have so much to gain by losing weight.  You may have already tried every diet and exercise plan imaginable.  And, you may have sought advice from your family physician, too.   Sometimes, in spite of such diligent efforts, you may not be able to achieve long-term results by yourself.  In cases of severe obesity, bariatric or weight loss surgery is a proven method of achieving long-term weight control.  Our Services Our bariatric surgery programs offer our patients new hope and long-term weight-loss solution.  Since introducing our services in 2003, we have conducted more than 2,400 successful procedures.  Our program is designated as a Programmer, multimedia by the Metabolic and Bariatric Surgery Accreditation and Quality Improvement Program (MBSAQIP), a IT trainer that sets rigorous patient safety and outcome standards.  Our program is also designated as a Ecologist by [employer].   Our exceptional weight-loss surgery team specializes in diagnosis, treatment, follow-up care, and ongoing support for our patients with severe weight loss challenges.  We currently offer laparoscopic sleeve gastrectomy, gastric bypass, and adjustable gastric band (LAP-BAND).    Attend our [place] Choosing to undergo a bariatric procedure is a big decision, and one that should not  be taken lightly.  You now have two options in how you learn about weight-loss surgery - in person or online.  Our objective is to ensure you have all of the information that you need to evaluate the advantages and obligations of this life changing procedure.  Please note that you are not alone in this process, and our experienced team is ready to assist and answer all of your questions.  There are several ways to register for a seminar (either  on-line or in person): 1)  Call [phone] 2) Go on-line to [facility] and register for either type of seminar.  [email or web address]

## 2018-10-31 NOTE — Telephone Encounter (Signed)
In my opinion, there is no difference between this and other options.  However, if you want an alternative, I would be happy to prescribe for you.

## 2018-11-01 ENCOUNTER — Other Ambulatory Visit: Payer: Self-pay | Admitting: Endocrinology

## 2018-11-01 ENCOUNTER — Encounter: Payer: Self-pay | Admitting: Internal Medicine

## 2018-11-01 ENCOUNTER — Encounter: Payer: Self-pay | Admitting: Endocrinology

## 2018-11-01 DIAGNOSIS — E119 Type 2 diabetes mellitus without complications: Secondary | ICD-10-CM

## 2018-11-01 MED ORDER — FARXIGA 10 MG PO TABS: 10.0000 mg | ORAL_TABLET | Freq: Every day | ORAL | 11 refills | Status: DC

## 2018-11-01 NOTE — Addendum Note (Signed)
Addended by: Renato Shin on: 11/01/2018 09:04 AM   Modules accepted: Orders

## 2018-11-01 NOTE — Telephone Encounter (Signed)
Before calling this pt, what alternative would you offer? I assume by "other options" you are meaning if DM left untreated/unmanaged, she subjects herself to kidney failure, dialysis, increased risk of heart disease, stroke, neuropathy, blindness, poor circulation resulting in amputation? Please advise

## 2018-11-01 NOTE — Telephone Encounter (Signed)
Ok, I have sent a prescription to your pharmacy, to change to [place]

## 2018-11-01 NOTE — Telephone Encounter (Signed)
Called pt and made her aware of all information below. States she is going to [employer] before picking it up from the pharmacy. In addition, pt expressed interest in learning about a CGM. Advised I would forward this message to Leonia Reader, RN for her to educate pt about a CGM as well as criteria associated with device. Verbalized acceptance and understanding.

## 2018-11-02 ENCOUNTER — Telehealth: Payer: Self-pay

## 2018-11-02 NOTE — Telephone Encounter (Signed)
Carrie Singh (Key: Carrie Singh) Carrie Singh 10MG  tablets   Form [employer] Medicare Part D Electronic Prior Authorization Form (2017 NCPDP) Created 5 minutes ago Sent to Plan 3 minutes ago Plan Response 2 minutes ago Submit Clinical Questions less than a minute ago Determination Wait for Determination Please wait for [employer] 2017 NCPDP to return a determination.

## 2018-11-02 NOTE — Telephone Encounter (Signed)
PA initiated today through Cover My Meds for [place]. Will await insurance response re: approval/denial.  [name] (Key: Idelle Crouch) Farxiga 10MG  tablets   Form [employer] Medicare Part D Electronic Prior Authorization Form (2017 NCPDP) Created 1 hour ago Sent to Plan 1 hour ago Plan Response 1 hour ago Submit Clinical Questions 1 hour ago Determination N/A 30 minutes ago Message from Plan Your request is pending review. You may contact the Prior Authorization Department at [phone] for further questions.

## 2018-11-02 NOTE — Telephone Encounter (Signed)
Mickel Baas,  Are you able to see this patient today?  Thanks, Aubrie Lucien

## 2018-11-03 ENCOUNTER — Encounter: Payer: Self-pay | Admitting: Endocrinology

## 2018-11-05 ENCOUNTER — Encounter: Payer: Medicare Other | Admitting: Internal Medicine

## 2018-11-05 ENCOUNTER — Other Ambulatory Visit: Payer: Self-pay | Admitting: Endocrinology

## 2018-11-05 MED ORDER — CANAGLIFLOZIN 300 MG PO TABS: 300.0000 mg | ORAL_TABLET | Freq: Every day | ORAL | 11 refills | Status: DC

## 2018-11-05 NOTE — Telephone Encounter (Signed)
Patient was informed that because she is not on insulin, and medicare will not pay for this.  Other questions were answered about cost and the different types.

## 2018-11-05 NOTE — Telephone Encounter (Signed)
Received notification from [employer] that PA for [name] has been denied. Documents have been labeled and placed on Dr. Cordelia Pen desk for him to review and address.

## 2018-11-06 ENCOUNTER — Encounter: Payer: Self-pay | Admitting: Endocrinology

## 2018-11-06 NOTE — Telephone Encounter (Signed)
Please advisee 

## 2018-11-12 LAB — HM DIABETES EYE EXAM

## 2018-11-15 ENCOUNTER — Encounter: Payer: Medicare Other | Attending: Internal Medicine | Admitting: Registered"

## 2018-11-15 DIAGNOSIS — E119 Type 2 diabetes mellitus without complications: Secondary | ICD-10-CM | POA: Insufficient documentation

## 2018-11-15 NOTE — Progress Notes (Signed)
Diabetes Self-Management Education  Visit Type:    Appt. Start Time: 0846 Appt. End Time: [id number]  11/15/2018  Carrie Singh, identified by name and date of birth, is a 63 y.o. female with a diagnosis of Diabetes:  .   ASSESSMENT  There were no vitals taken for this visit. There is no height or weight on file to calculate BMI.   This visit was completed via MyChart due to COVID-19 pandemic.  Pt was alone for visit with husband sometimes seen in the background. Pt has PMH of breast cancer, GERD, HLD, and HTN.   Pt reports she is doing good but her blood sugars are still not where she wants them to be. Pt reports she would like for her fasting blood sugar to be below 120 and those taken after meals below 180. Pt reports her numbers were down last week but have been higher this week. Reports last week fasting blood sugars were 130-155 and those taken after meals were 101-274. This week pt reports fastings between 177-256. Pt did not report whether or not she has felt any different this week as far as pain or discomfort or stress. Pt reports she may need to start back eating salads because she thinks her blood sugar was better when she included more salad but she has gotten tired of them.   Pt saw an endocrinologist since last appointment and was started on Invokana and has also started taking a chromium supplement after asking her doctor about it in addition to the glipizide she was already taking. Pt emailed food and blood sugar logs over to dietitian for past several weeks. Noted per logs, since pt started taking her new medication pt has reported more numbers below 200 with the exception of this week.   24-Hour Recall: Fasting blood sugar: 179 Breakfast (AM): boiled egg, 1 banana (2 carbohydrate choices), Cut weight loss drink Snk: None reported.  Lunch (PM): cubed steak (small amount), rice and gravy (~1/2 cup ~1.5 carbohydrate choices)  Snk: handful chips (~ 1 carbohydrate choice),  nuts Dinner (630-7PM): broccoli, parmesan chicken (ate about 1/2 cup noodles / ~1.5 carbohydrate choices), 1 piece garlic toast ([id number] carbohydrate choices), about 8-9 cookies (~22 g carbohydrates/~1.5 carbohydrate choices per label) Snk (930 PM): 1 apple   Blood sugar before bedtime (about 3 hours after apple): 244  Physical Activity: Pt has continued with evening walks most days.   Individualized Plan for Diabetes Self-Management Training:   Learning Objective:  Patient will have a greater understanding of diabetes self-management. Patient education plan is to attend individual and/or group sessions per assessed needs and concerns.   Plan: Dietitian discussed with pt that although blood sugars are still not at goal yet, it is good we have seen them come down from those reported at last appointment. Discussed having more consistency with carbohydrate amount at each meal and how long after meals blood sugar is checked. Recommended continuing to check 2 times per day-fasting and once 1-2 hours after a meal to provide good information as we continue working to bring them further down. Discussed dietitian sending pt a more specific meal plan than meals discussed in the past to help with consistency of carbohydrate intake at meals/snacks. With increasing consistency of meals it can then be easier to tell if there are other things affecting changes in blood sugar as well.  Dietitian let pt know she will work on meal plan based on pt's submitted food logs and send it to pt. Pt appeared  agreeable to plan discussed.  Nutrition Goals/Recommendations:   -Continue working to eat consistently 3 meals/every 3-5 hours.  -Continue working to have consistent amount of carbohydrates at each meal: 2-3 carbohydrate choices per meal (30-45 g carbohydrates).  -For snacks, recommend 1 carbohydrate choice or less and including a protein. Raw vegetables can make great snacks as well if you are wanting to snack soon  after eating.   -Avoid lying down within 3 hours of eating   -Continue tracking foods and blood sugar and want to pay special attention to evening snacks and fasting blood sugar the next day to look for patterns. Recommend trying different types of snacks (1 carbohydrate choice + protein vs protein only vs carbohydrate only) and seeing if fasting blood sugar improves.  Patient Instructions  Nutrition Goals/Recommendations:  You are doing a good job tracking your foods and blood sugar and including physical activity!  -Continue working to eat consistently 3 meals/every 3-5 hours.  -Continue working to have consistent amount of carbohydrates at each meal: 2-3 carbohydrate choices per meal (30-45 g carbohydrates)  -For snacks, recommend 1 carbohydrate choice or less and including a protein. Raw vegetables can make great snacks as well if you are wanting to snack soon after eating.   -Avoid lying down within 3 hours of eating   -Continue tracking foods and blood sugar and want to pay special attention to evening snacks and fasting blood sugar the next day to look for patterns. Recommend trying different types of snacks (1 carbohydrate choice + protein vs protein only vs carbohydrate only) and seeing if fasting blood sugar improves.       If problems or questions, patient to contact team via:  Phone and Email  Future DSME appointment:  1 month.

## 2018-11-16 ENCOUNTER — Encounter: Payer: Self-pay | Admitting: Registered"

## 2018-11-16 NOTE — Patient Instructions (Signed)
Nutrition Goals/Recommendations:  You are doing a good job tracking your foods and blood sugar and including physical activity!  -Continue working to eat consistently 3 meals/every 3-5 hours.  -Continue working to have consistent amount of carbohydrates at each meal: 2-3 carbohydrate choices per meal (30-45 g carbohydrates)  -For snacks, recommend 1 carbohydrate choice or less and including a protein. Raw vegetables can make great snacks as well if you are wanting to snack soon after eating.   -Avoid lying down within 3 hours of eating   -Continue tracking foods and blood sugar and want to pay special attention to evening snacks and fasting blood sugar the next day to look for patterns. Recommend trying different types of snacks (1 carbohydrate choice + protein vs protein only vs carbohydrate only) and seeing if fasting blood sugar improves.

## 2018-11-22 ENCOUNTER — Ambulatory Visit: Payer: Medicare Other | Admitting: Podiatry

## 2018-11-26 ENCOUNTER — Ambulatory Visit: Payer: Medicare Other | Admitting: Podiatry

## 2018-11-26 ENCOUNTER — Encounter: Payer: Self-pay | Admitting: Podiatry

## 2018-11-26 ENCOUNTER — Other Ambulatory Visit: Payer: Self-pay

## 2018-11-26 DIAGNOSIS — M2041 Other hammer toe(s) (acquired), right foot: Secondary | ICD-10-CM

## 2018-11-26 DIAGNOSIS — M79675 Pain in left toe(s): Secondary | ICD-10-CM

## 2018-11-26 DIAGNOSIS — B351 Tinea unguium: Secondary | ICD-10-CM

## 2018-11-26 DIAGNOSIS — M79674 Pain in right toe(s): Secondary | ICD-10-CM

## 2018-11-26 DIAGNOSIS — M21619 Bunion of unspecified foot: Secondary | ICD-10-CM

## 2018-11-29 NOTE — Progress Notes (Signed)
Subjective:   Patient ID: Carrie Singh, female   DOB: 63 y.o.   MRN: [id number]   HPI Patient presents with long-term diabetes under good control with thick yellow brittle nailbeds 1-5 both feet and is noted to have structural bunion and hammertoe deformity bilateral   ROS      Objective:  Physical Exam  Neurovascular status is intact and unchanged with patient noted to have thick yellow brittle nailbeds 1-5 both feet that are bothersome when pressed it hard to wear shoe gear that she cannot trim herself along with the risk of diabetes.  She has structural deformity with bunion deformity hammertoe deformity bilateral     Assessment:  Mycotic nail infection with at risk condition along with structural deformity bilateral     Plan:  Reviewed condition and I still want her to lower her A1c and I did give her advice on diabetic foot care.  I debrided nailbeds 1-5 both feet with no iatrogenic bleeding and do not recommend current surgical correction of deformity but it may be necessary at one point in future

## 2018-12-06 ENCOUNTER — Encounter: Payer: Self-pay | Admitting: Internal Medicine

## 2018-12-06 ENCOUNTER — Ambulatory Visit (INDEPENDENT_AMBULATORY_CARE_PROVIDER_SITE_OTHER): Payer: Medicare Other | Admitting: Internal Medicine

## 2018-12-06 ENCOUNTER — Other Ambulatory Visit: Payer: Self-pay

## 2018-12-06 VITALS — BP 118/70 | HR 61 | Temp 97.6°F | Ht 65.0 in | Wt 225.0 lb

## 2018-12-06 DIAGNOSIS — E1165 Type 2 diabetes mellitus with hyperglycemia: Secondary | ICD-10-CM | POA: Diagnosis not present

## 2018-12-06 DIAGNOSIS — F329 Major depressive disorder, single episode, unspecified: Secondary | ICD-10-CM

## 2018-12-06 DIAGNOSIS — C50511 Malignant neoplasm of lower-outer quadrant of right female breast: Secondary | ICD-10-CM

## 2018-12-06 DIAGNOSIS — E782 Mixed hyperlipidemia: Secondary | ICD-10-CM | POA: Diagnosis not present

## 2018-12-06 DIAGNOSIS — Z170 Estrogen receptor positive status [ER+]: Secondary | ICD-10-CM

## 2018-12-06 DIAGNOSIS — Z1211 Encounter for screening for malignant neoplasm of colon: Secondary | ICD-10-CM | POA: Diagnosis not present

## 2018-12-06 DIAGNOSIS — F450 Somatization disorder: Secondary | ICD-10-CM | POA: Diagnosis not present

## 2018-12-06 DIAGNOSIS — I10 Essential (primary) hypertension: Secondary | ICD-10-CM

## 2018-12-06 DIAGNOSIS — Z23 Encounter for immunization: Secondary | ICD-10-CM | POA: Diagnosis not present

## 2018-12-06 DIAGNOSIS — K219 Gastro-esophageal reflux disease without esophagitis: Secondary | ICD-10-CM | POA: Diagnosis not present

## 2018-12-06 DIAGNOSIS — Z0000 Encounter for general adult medical examination without abnormal findings: Secondary | ICD-10-CM

## 2018-12-06 DIAGNOSIS — G44011 Episodic cluster headache, intractable: Secondary | ICD-10-CM

## 2018-12-06 LAB — COMPREHENSIVE METABOLIC PANEL
ALT: 15 U/L (ref 0–35)
AST: 14 U/L (ref 0–37)
Albumin: 4.5 g/dL (ref 3.5–5.2)
Alkaline Phosphatase: 80 U/L (ref 39–117)
BUN: 24 mg/dL — ABNORMAL HIGH (ref 6–23)
CO2: 27 mEq/L (ref 19–32)
Calcium: 10 mg/dL (ref 8.4–10.5)
Chloride: 104 mEq/L (ref 96–112)
Creatinine, Ser: 1.34 mg/dL — ABNORMAL HIGH (ref 0.40–1.20)
GFR: 47.94 mL/min — ABNORMAL LOW (ref >60.00)
Glucose, Bld: 164 mg/dL — ABNORMAL HIGH (ref 70–99)
Potassium: 3.8 mEq/L (ref 3.5–5.1)
Sodium: 140 mEq/L (ref 135–145)
Total Bilirubin: 0.9 mg/dL (ref 0.2–1.2)
Total Protein: 7.6 g/dL (ref 6.0–8.3)

## 2018-12-06 LAB — LIPID PANEL
Cholesterol: 180 mg/dL (ref 0–200)
HDL: 55.3 mg/dL (ref >39.00)
LDL Cholesterol: 102 mg/dL — ABNORMAL HIGH (ref 0–99)
NonHDL: 124.47
Total CHOL/HDL Ratio: 3
Triglycerides: 112 mg/dL (ref 0.0–149.0)
VLDL: 22.4 mg/dL (ref 0.0–40.0)

## 2018-12-06 LAB — CBC
HCT: 39.5 % (ref 36.0–46.0)
Hemoglobin: 13.2 g/dL (ref 12.0–15.0)
MCHC: 33.4 g/dL (ref 30.0–36.0)
MCV: 87 fl (ref 78.0–100.0)
Platelets: 258 10*3/uL (ref 150.0–400.0)
RBC: 4.53 Mil/uL (ref 3.87–5.11)
RDW: 13.6 % (ref 11.5–15.5)
WBC: 4.6 10*3/uL (ref 4.0–10.5)

## 2018-12-06 LAB — VITAMIN D 25 HYDROXY (VIT D DEFICIENCY, FRACTURES): VITD: 34.56 ng/mL (ref 30.00–100.00)

## 2018-12-06 NOTE — Assessment & Plan Note (Addendum)
Continue taking Hydralazine, HCTZ, Atenolol and Irbasartan as prescribed. CMET today Reinforced DASH diet and exercise for weight loss Will monitor

## 2018-12-06 NOTE — Assessment & Plan Note (Addendum)
CMET and Lipid profile today Continue taking Provastatin Encouraged to consume low-fat diet Ordered Lipid profile

## 2018-12-06 NOTE — Assessment & Plan Note (Addendum)
No medication indicated at this time.  Will continue to monitor

## 2018-12-06 NOTE — Progress Notes (Signed)
HPI:  Pt presents to the clinic today for her subsequent annual Medicare Wellness Exam.  Hx of Right Breast Cancer: s/p lumpectomy with subsequent mastectomy, chemo and radiation. She is still taking Anastrozole. She follows up with [facility] for treatment.   Depression: Chronic dysthymia. Reports a little depressed over her diabetes and low energy, otherwise no concerns. She does not see a therapist or takes medication for this. She denies anxiety, SI/HI.  DM 2: Her last A1C was 8.6 % 10/20. Her sugars range around 170 to 200. She checks them regularly. She is taking Glipizide and Invokana as prescribed. She follows up with podiatry and gets her vision checked regularly.  GERD: Intermittent, she cannot recall any triggers. Controlled off meds.  HTN: Her BP today is 118/70. Marland Kitchen She is taking Hydralazine, HCTZ, Atenolol and Losartan. ECG from 01/2018 reviewed.  HLD: Her last LDL was 90 on 11/2017. Taking Pravastin as prescribed. She tries to consume a low fat diet.  Migraines: Improved in the last 2-3 months.. She thinks they are triggered by her HTN. She takes [employer] OTC with good relief.  Past Medical History:  Diagnosis Date  . Breast cancer ([place])   . Breast cancer, right breast ([place]) 12/2007   a. s/p chemo, radiation, and lumpectomy with negative sentinel lymph node biopsy   . Depression   . Diabetes mellitus without complication ([place])   . GERD (gastroesophageal reflux disease)   . Hyperlipidemia   . Hypertension    resistant, negative renal Dopplers and negative CT angiogram  . Migraines   . Obesity   . Seizures ([place])    last seizure  1 week ago: states she jumps for a second and it's gone    Current Outpatient Medications  Medication Sig Dispense Refill  . Accu-Chek Softclix Lancets lancets Use 2 times daily 200 each 2  . anastrozole (ARIMIDEX) 1 MG tablet Take 1 tablet (1 mg total) by mouth daily. 90 tablet 3  . Aspirin-Acetaminophen-Caffeine (GOODY HEADACHE PO)  Take 1 packet by mouth as needed (for migraines).     Marland Kitchen atenolol (TENORMIN) 25 MG tablet Take 1 tablet (25 mg total) by mouth 2 (two) times daily. 180 tablet 3  . Biotin w/ Vitamins C & E (HAIR SKIN & NAILS GUMMIES PO) Take 1 tablet by mouth daily.     . Blood Glucose Monitoring Suppl (ACCU-CHEK AVIVA PLUS) w/Device KIT USE ONCE DAILY (Patient taking differently: 1 each by Other route 2 (two) times daily. ) 1 kit 0  . canagliflozin (INVOKANA) 300 MG TABS tablet Take 1 tablet (300 mg total) by mouth daily before breakfast. 30 tablet 11  . CHROMIUM PICOLINATE PO Take by mouth.    Marland Kitchen glipiZIDE (GLUCOTROL) 10 MG tablet TAKE 1 TABLET BY MOUTH  TWICE DAILY BEFORE A MEAL 180 tablet 0  . glucose blood (ACCU-CHEK AVIVA PLUS) test strip 1 each by Other route 2 (two) times a day. Use as instructed 200 each 2  . hydrALAZINE (APRESOLINE) 100 MG tablet TAKE 1 TABLET BY MOUTH 3  TIMES DAILY 270 tablet 3  . hydrochlorothiazide (HYDRODIURIL) 12.5 MG tablet TAKE 1 TABLET BY MOUTH  DAILY 90 tablet 0  . irbesartan (AVAPRO) 300 MG tablet Take 1 tablet (300 mg total) by mouth daily. 90 tablet 3  . Multiple Vitamins-Minerals (ONE-A-DAY WOMENS VITACRAVES) CHEW Chew 1 tablet by mouth daily.     . pravastatin (PRAVACHOL) 40 MG tablet TAKE 1 TABLET BY MOUTH  DAILY 90 tablet 0  .  tizanidine (ZANAFLEX) 2 MG capsule Take 1 capsule (2 mg total) by mouth at bedtime as needed for muscle spasms. 15 capsule 0   No current facility-administered medications for this visit.     No Known Allergies  Family History  Problem Relation Age of Onset  . Stroke Mother   . Heart disease Father   . Asthma Father   . Cancer Father        colon  . Cancer Sister        lung  . Cancer Brother        colon  . Diabetes Brother   . Breast cancer Other 41    Social History   Socioeconomic History  . Marital status: Married    Spouse name: Carrie Singh  . Number of children: Not on file  . Years of education: 12+  . Highest education  level: Not on file  Occupational History  . Occupation: [employer]  Social Needs  . Financial resource strain: Not on file  . Food insecurity    Worry: Not on file    Inability: Not on file  . Transportation needs    Medical: Not on file    Non-medical: Not on file  Tobacco Use  . Smoking status: Never Smoker  . Smokeless tobacco: Never Used  Substance and Sexual Activity  . Alcohol use: No    Alcohol/week: 0.0 standard drinks  . Drug use: No  . Sexual activity: Yes  Lifestyle  . Physical activity    Days per week: Not on file    Minutes per session: Not on file  . Stress: Not on file  Relationships  . Social Herbalist on phone: Not on file    Gets together: Not on file    Attends religious service: Not on file    Active member of club or organization: Not on file    Attends meetings of clubs or organizations: Not on file    Relationship status: Not on file  . Intimate partner violence    Fear of current or ex partner: Not on file    Emotionally abused: Not on file    Physically abused: Not on file    Forced sexual activity: Not on file  Other Topics Concern  . Not on file  Social History Narrative   Lives at home with husband.   Caffeine use: 1-2 drinks per month         Epworth Sleepiness Scale = 15 (as of 01/01/15)    Hospitiliaztions: None  Health Maintenance:    Flu: 09/2018  Tetanus: 12/2008   Pneumovax: never  Prevnar: never  Zostavax: never  Shingrix: never  Mammogram: 2020  Pap Smear: 2010 hysterectomy   Bone Density: 08/2017  Colon Screening: 01/2011,   Eye Doctor: Annually  Dental Exam: Annually   Providers:   PCP: Carrie Silversmith, NP  Endocrinologist: Dr. Loanne Singh  Podiatry: Dr. Paulla Singh  Oncology: Dr. Lindi Singh  Cardiology: Dr. Gwenlyn Singh   I have personally reviewed and have noted:  1. The patient's medical and social history 2. Their use of alcohol, tobacco or illicit drugs 3. Their current medications and supplements 4. The patient's  functional ability including ADL's, fall risks, home safety risks and hearing or visual impairment. 5. Diet and physical activities 6. Evidence for depression or mood disorder  Subjective:   Review of Systems:   Constitutional: Pt reports intermittent headaches. Denies fever, malaise, fatigue, or abrupt weight changes.  HEENT: Denies eye pain, eye  redness, ear pain, ringing in the ears, wax buildup, runny nose, nasal congestion, bloody nose, or sore throat. Respiratory: Denies difficulty breathing, shortness of breath, cough or sputum production.   Cardiovascular: Denies chest pain, chest tightness, palpitations or swelling in the hands or feet.  Gastrointestinal: Pt reports intermittent reflux. Denies abdominal pain, bloating, constipation, diarrhea or blood in the stool.  GU: Denies urgency, frequency, pain with urination, burning sensation, blood in urine, odor or discharge. Musculoskeletal: Denies decrease in range of motion, difficulty with gait, muscle pain or joint pain and swelling.  Skin: Denies redness, rashes, lesions or ulcercations.  Neurological: Denies dizziness, difficulty with memory, difficulty with speech or problems with balance and coordination.  Psych: Pt has a history of depression. Denies anxiety, SI/HI.  No other specific complaints in a complete review of systems (except as listed in HPI above).  Objective:  PE:   BP 118/70   Pulse 61   Temp 97.6 F (36.4 C) (Temporal)   Ht _0  (1.651 m)   Wt 225 lb (102.1 kg)   SpO2 98%   BMI 37.44 kg/m   Wt Readings from Last 3 Encounters:  10/31/18 230 lb 12.8 oz (104.7 kg)  08/14/18 231 lb 12.8 oz (105.1 kg)  07/30/18 231 lb (104.8 kg)    General: Appears her stated age, obese,  in NAD. Skin: Warm, dry and intact. No ulcerations noted. HEENT: Head: normal shape and size; Eyes: sclera white, no icterus, conjunctiva pink and EOMs intact Neck: Neck supple, trachea midline. No masses, lumps or thyromegaly  present.  Cardiovascular: Normal rate and rhythm. S1,S2 noted.  No murmur, rubs or gallops noted. No JVD or BLE edema. No carotid bruits noted. Pulmonary/Chest: Normal effort and positive vesicular breath sounds. No respiratory distress. No wheezes, rales or ronchi noted.  Abdomen: Soft and nontender. Normal bowel sounds. No distention or masses noted. Liver, spleen and kidneys non palpable. Musculoskeletal: Strength 5/5 BUE/BLE. No difficulty with gait. Neurological: Alert and oriented. Slower to respond this am. Cranial nerves II-XII grossly intact. Coordination normal.  Psychiatric: Mood and affect normal. Behavior is normal. Judgment and thought content normal.     BMET    Component Value Date/Time   NA 140 01/30/2018 1208   NA 146 (H) 01/20/2015 1457   NA 143 10/12/2013 1510   K 4.1 01/30/2018 1208   K 3.9 10/12/2013 1510   CL 104 01/30/2018 1208   CL 109 (H) 10/12/2013 1510   CO2 29 01/30/2018 1208   CO2 29 10/12/2013 1510   GLUCOSE 116 (H) 01/30/2018 1208   GLUCOSE 92 10/12/2013 1510   BUN 20 01/30/2018 1208   BUN 17 01/20/2015 1457   BUN 16 10/12/2013 1510   CREATININE 1.18 01/30/2018 1208   CREATININE 1.15 (H) 10/12/2015 1603   CALCIUM 9.8 01/30/2018 1208   CALCIUM 9.4 10/12/2013 1510   GFRNONAA >60 01/24/2018 2159   GFRNONAA 56 (L) 10/12/2013 1510   GFRAA >60 01/24/2018 2159   GFRAA >60 10/12/2013 1510    Lipid Panel     Component Value Date/Time   CHOL 173 11/27/2017 1558   CHOL 179 10/17/2014 1527   TRIG 79.0 11/27/2017 1558   HDL 66.60 11/27/2017 1558   HDL 59 10/17/2014 1527   CHOLHDL 3 11/27/2017 1558   VLDL 15.8 11/27/2017 1558   LDLCALC 90 11/27/2017 1558   LDLCALC 91 10/17/2014 1527    CBC    Component Value Date/Time   WBC 5.3 01/24/2018 2159   RBC 4.08  01/24/2018 2159   HGB 11.7 (L) 01/24/2018 2159   HGB 13.5 10/12/2013 1510   HGB 12.8 08/13/2009 0946   HCT 36.8 01/24/2018 2159   HCT 40.5 10/12/2013 1510   HCT 36.8 08/13/2009 0946    PLT 230 01/24/2018 2159   PLT 278 10/12/2013 1510   PLT 271 08/13/2009 0946   MCV 90.2 01/24/2018 2159   MCV 87 10/12/2013 1510   MCV 87.0 08/13/2009 0946   MCH 28.7 01/24/2018 2159   MCHC 31.8 01/24/2018 2159   RDW 13.2 01/24/2018 2159   RDW 13.6 10/12/2013 1510   RDW 13.8 08/13/2009 0946   LYMPHSABS 2.1 01/24/2018 2159   LYMPHSABS 1.5 08/13/2009 0946   MONOABS 0.3 01/24/2018 2159   MONOABS 0.2 08/13/2009 0946   EOSABS 0.1 01/24/2018 2159   EOSABS 0.1 08/13/2009 0946   BASOSABS 0.1 01/24/2018 2159   BASOSABS 0.0 08/13/2009 0946    Hgb A1C Lab Results  Component Value Date   HGBA1C 8.6 (A) 10/31/2018      Assessment and Plan:   Medicare Annual Wellness Visit:  Diet: She does eat meat. She consumes fruits and veggies daily. She tries to avoid fried foods. She drinks mostly coffee, juice and water. Physical activity: Sedentary Depression/mood screen: Chronic, off meds. PHQ 9 score of 4 Hearing: Intact to whispered voice Visual acuity: Grossly normal, performs annual eye exam  ADLs: Capable Fall risk: None Home safety: Good Cognitive evaluation: Intact to orientation, naming, recall and repetition, MMSE score of 29 EOL planning: Adv directives, full code/ I agree  Preventative Medicine: Flu shot UTD. She declines tetanus for financial reasons. Prevnar today. Will get pneumovax in 1 year. She declines shingles vaccine. She no longer needs pap smears. Mammogram UTD. Bon density UTD. Referral to GI placed for screening colonoscopy. Encouraged her to consume a balanced diet and exercise regimen. Advised her to see an eye doctor and dentist annually. Will check CBC, CMET, Lipid and Vit D today. Due dates for screening exams given to patient as part of her AVS.   Next appointment: 6 months, follow up chronic conditions.   Carrie Silversmith, NP

## 2018-12-06 NOTE — Assessment & Plan Note (Addendum)
Chronic Support offered today Feels she does not need medication at this time Will monitor

## 2018-12-06 NOTE — Assessment & Plan Note (Addendum)
A1C today No microalbumin secondary to ARB therapy Continue Invokana  and Glipizide as prescribed Encouraged low carb diet and exercise for weight loss Encouraged to get vision checked annually Foot exam today Flu shot UTD Prevnar today

## 2018-12-07 ENCOUNTER — Encounter: Payer: Self-pay | Admitting: Internal Medicine

## 2018-12-07 DIAGNOSIS — N289 Disorder of kidney and ureter, unspecified: Secondary | ICD-10-CM

## 2018-12-07 NOTE — Addendum Note (Signed)
Addended by: Lurlean Nanny on: 12/07/2018 11:58 AM   Modules accepted: Orders

## 2018-12-07 NOTE — Assessment & Plan Note (Signed)
Continue Anastrozole Yearly mammogram Continue to follow with oncology

## 2018-12-07 NOTE — Patient Instructions (Signed)

## 2018-12-07 NOTE — Assessment & Plan Note (Signed)
No intervention at this time Continue to monitor

## 2018-12-19 ENCOUNTER — Encounter: Payer: Medicare Other | Attending: Internal Medicine | Admitting: Registered"

## 2018-12-19 ENCOUNTER — Telehealth: Payer: Self-pay

## 2018-12-19 DIAGNOSIS — E119 Type 2 diabetes mellitus without complications: Secondary | ICD-10-CM | POA: Diagnosis present

## 2018-12-19 NOTE — Telephone Encounter (Signed)
ROI fax to [facility] for patient records.

## 2018-12-21 ENCOUNTER — Encounter: Payer: Self-pay | Admitting: Registered"

## 2018-12-21 NOTE — Progress Notes (Addendum)
Diabetes Self-Management Education  Visit Type:    Appt. Start Time: 0802 Appt. End Time: 0900  12/19/2018  [name], identified by name and date of birth, is a 63 y.o. female with a diagnosis of Diabetes:    ASSESSMENT  There were no vitals taken for this visit. There is no height or weight on file to calculate BMI.   This visit was completed via MyChart due to COVID-19 pandemic.   Pt was alone for visit. Pt has PMH of breast cancer, GERD, HLD, and HTN.   Pt reports her changes are working slowly, but surely. Pt reports that the meal plan dietitian sent over to her has helped and she has used it and added to it based on information. Pt would like to know if she can have gum and what type of ice cream she can have.   Pt reports over past week her numbers have improved. She reports she recently stopped eating at 7 pm and feels that has helped. Pt also reports that if her blood sugar is higher she will reference her food log to see what she had to eat at previous meal. Pt reports she has been taking evening blood sugar 3 hours after eating instead of 2 hours. After discussing, pt feels it may be 2 hours after snack. Pt now knows to check 1-2 hours after eating for postprandial reading.   Pt reports she has lost 5 lb. Reports she hopes to lose more soon. Pt reports that her doctor talked to her about bariatric surgery. Pt reports she really does not want surgery and would like to do it on her own.    Pt reports she has been including physical activity most days. Will include indoor PA if unable to go outside to walk most days.   Blood Glucose Readings: 11/23: 145 (fasting); 148 (3 hours after eating in evening) 11/24: 140 (fasting); 163 (3 hours after eating in evening) 11/25: 134 (fasting); 145 (3 hours after eating in evening) 11/26: 124 (fasting); 152 (3 hours after eating in evening) 11/27: 117 (fasting); 151 (3 hours after eating in evening)  11/28: 166 (fasting); 170 (3  hours after eating in evening)  11/29: 110 (fasting); 138 (2-4 hours after eating in evening)   24-Hour Recall: Breakfast (AM): [place] yogurt with blackberries in it, 1 boiled egg Lunch (PM): cottage cheese, Tostitos chips Dinner (630-7PM): salmon, corn, lima beans, yams (small amount of the starches) Snacks: nuts, chips, 8 grapes (unsure when) Beverages: 4 bottles of water   Physical Activity: Pt has continued with evening walks most days and reports doing physical activity inside if unable to go on walks.   Individualized Plan for Diabetes Self-Management Training:   Learning Objective:  Patient will have a greater understanding of diabetes self-management. Patient education plan is to attend individual and/or group sessions per assessed needs and concerns.   Plan: Dietitian praised pt for continue efforts and to make healthy changes. Discussed that pt's blood sugar readings over past week have included many close or within range, showing great progress. Discussed dietary guidelines required for those that have bariatric surgery and that whether having surgery or trying to improve health and lose weight without surgery, healthy lifestyle changes are key for success. Discussed balance with meals and snacks. Discussed having ice cream earlier in the day if having ice cream rather than in the evening. Discussed Yasso bars as they are lower in sugar and provide some protein. Dietitian to provide further lower sugar  ice cream options to pt later and let pt know dietitian will mail pt a copy of the exchange list to further reference with building balanced meals with 2-3 carbohydrate choices. Pt appeared agreeable to plan discussed.  Nutrition Goals/Recommendations:  -Continue working to eat consistently 3 meals/every 3-5 hours.  -Continue working to have consistent amount of carbohydrates at each meal: 2-3 carbohydrate choices per meal (30-45 g carbohydrates).  -For snacks, recommend 1  carbohydrate choice or less and including a protein. Raw vegetables can make great snacks as well if you are wanting to snack soon after eating.   -Continue trying to avoid eating within 3 hours of when you are lying down.  -Continue with checking blood sugar in the morning (fasting) and 1-2 hours after a meal.    -Continue tracking foods and blood sugar-you are doing a great job with tracking and using your log to assess how foods are affecting your blood sugar.   -If you do have ice cream, recommend watching the amount and trying to stay around 1/2 cup. Also recommend having it earlier in the day and not in the evening.    Some ice creams/frozen yogurts that are lower in sugar:   Yasso Bars   Halo Top  [employer] Carb Smart   Skinny Cow   Patient Instructions  Nutrition Goals/Recommendations:  -Continue working to eat consistently 3 meals/every 3-5 hours.  -Continue working to have consistent amount of carbohydrates at each meal: 2-3 carbohydrate choices per meal (30-45 g carbohydrates).  -For snacks, recommend 1 carbohydrate choice or less and including a protein. Raw vegetables can make great snacks as well if you are wanting to snack soon after eating.   -Continue trying to avoid eating within 3 hours of when you are lying down.  -Continue with checking blood sugar in the morning (fasting) and 1-2 hours after a meal.    -Continue tracking foods and blood sugar-you are doing a great job with tracking and using your log to assess how foods are affecting your blood sugar.   -If you do have ice cream, recommend watching the amount and trying to stay around 1/2 cup. Also recommend having it earlier in the day and not in the evening.    Some ice creams/frozen yogurts that are lower in sugar:   Yasso Bars   Halo Top  [employer] Carb Smart   Skinny Cow           If problems or questions, patient to contact team via:  Regulatory affairs officer  Future DSME appointment:  1 month.

## 2018-12-21 NOTE — Patient Instructions (Addendum)
Nutrition Goals/Recommendations:  -Continue working to eat consistently 3 meals/every 3-5 hours.  -Continue working to have consistent amount of carbohydrates at each meal: 2-3 carbohydrate choices per meal (30-45 g carbohydrates).  -For snacks, recommend 1 carbohydrate choice or less and including a protein. Raw vegetables can make great snacks as well if you are wanting to snack soon after eating.   -Continue trying to avoid eating within 3 hours of when you are lying down.  -Continue with checking blood sugar in the morning (fasting) and 1-2 hours after a meal.    -Continue tracking foods and blood sugar-you are doing a great job with tracking and using your log to assess how foods are affecting your blood sugar.   -If you do have ice cream, recommend watching the amount and trying to stay around 1/2 cup. Also recommend having it earlier in the day and not in the evening.    Some ice creams/frozen yogurts that are lower in sugar:   Yasso Bars   Halo Top  B[place]ers Carb Smart   Skinny Cow

## 2018-12-24 ENCOUNTER — Encounter: Payer: Self-pay | Admitting: Internal Medicine

## 2018-12-25 ENCOUNTER — Other Ambulatory Visit: Payer: Self-pay | Admitting: Internal Medicine

## 2018-12-25 DIAGNOSIS — E119 Type 2 diabetes mellitus without complications: Secondary | ICD-10-CM

## 2018-12-31 ENCOUNTER — Encounter: Payer: Self-pay | Admitting: Internal Medicine

## 2018-12-31 ENCOUNTER — Other Ambulatory Visit (INDEPENDENT_AMBULATORY_CARE_PROVIDER_SITE_OTHER): Payer: Medicare Other

## 2018-12-31 ENCOUNTER — Other Ambulatory Visit: Payer: Self-pay

## 2018-12-31 DIAGNOSIS — N289 Disorder of kidney and ureter, unspecified: Secondary | ICD-10-CM | POA: Diagnosis not present

## 2018-12-31 DIAGNOSIS — N183 Chronic kidney disease, stage 3 unspecified: Secondary | ICD-10-CM

## 2018-12-31 LAB — BASIC METABOLIC PANEL
BUN: 15 mg/dL (ref 6–23)
CO2: 27 mEq/L (ref 19–32)
Calcium: 9.8 mg/dL (ref 8.4–10.5)
Chloride: 105 mEq/L (ref 96–112)
Creatinine, Ser: 1.3 mg/dL — ABNORMAL HIGH (ref 0.40–1.20)
GFR: 49.64 mL/min — ABNORMAL LOW (ref >60.00)
Glucose, Bld: 263 mg/dL — ABNORMAL HIGH (ref 70–99)
Potassium: 4.1 mEq/L (ref 3.5–5.1)
Sodium: 141 mEq/L (ref 135–145)

## 2019-01-02 ENCOUNTER — Ambulatory Visit (INDEPENDENT_AMBULATORY_CARE_PROVIDER_SITE_OTHER): Payer: Medicare Other | Admitting: Endocrinology

## 2019-01-02 ENCOUNTER — Encounter: Payer: Self-pay | Admitting: Internal Medicine

## 2019-01-02 ENCOUNTER — Other Ambulatory Visit: Payer: Self-pay

## 2019-01-02 ENCOUNTER — Encounter: Payer: Self-pay | Admitting: Endocrinology

## 2019-01-02 DIAGNOSIS — E119 Type 2 diabetes mellitus without complications: Secondary | ICD-10-CM

## 2019-01-02 MED ORDER — GLIPIZIDE 10 MG PO TABS: 5.0000 mg | ORAL_TABLET | Freq: Every day | ORAL | 2 refills | Status: DC

## 2019-01-02 MED ORDER — RYBELSUS 3 MG PO TABS: 3.0000 mg | ORAL_TABLET | ORAL | 11 refills | Status: DC

## 2019-01-02 MED ORDER — FARXIGA 10 MG PO TABS: 10.0000 mg | ORAL_TABLET | Freq: Every day | ORAL | 11 refills | Status: DC

## 2019-01-02 NOTE — Patient Instructions (Addendum)
I have sent a prescription to your pharmacy, to add Rybelsus, and to change Invokana to farxiga Please reduce the glipizide to 5 mg qam.   Please come back for a follow-up appointment in 1 month.

## 2019-01-02 NOTE — Addendum Note (Signed)
Addended by: Jearld Fenton on: 01/02/2019 12:14 PM   Modules accepted: Orders

## 2019-01-02 NOTE — Progress Notes (Signed)
Subjective:    Patient ID: Carrie Singh, female    DOB: [birth year], 63 y.o.   MRN: [id number]  HPI Pt returns for f/u of diabetes mellitus: DM type: 2 Dx'ed: 8453 Complications: renal insuff Therapy: 2 oral meds GDM: never DKA: never Severe hypoglycemia: never Pancreatitis: never Pancreatic imaging: normal on 2010 [place] Other: she has never been on insulin; renal insuff limits rx options Interval history: Pt says cbg varies from 83-268.  pt states she feels well in general.  She takes meds as rx'ed. Past Medical History:  Diagnosis Date  . Breast cancer ([place])   . Breast cancer, right breast ([place]) 12/2007   a. s/p chemo, radiation, and lumpectomy with negative sentinel lymph node biopsy   . Depression   . Diabetes mellitus without complication ([place])   . GERD (gastroesophageal reflux disease)   . Hyperlipidemia   . Hypertension    resistant, negative renal Dopplers and negative CT angiogram  . Migraines   . Obesity   . Seizures ([place])    last seizure  1 week ago: states she jumps for a second and it's gone    Past Surgical History:  Procedure Laterality Date  . ABDOMINAL HYSTERECTOMY    . BREAST LUMPECTOMY    . CYST EXCISION    . FOOT SURGERY    . MASTECTOMY W/ SENTINEL NODE BIOPSY Right 03/18/2016   Procedure: RIGHT MASTECTOMY WITH RIGHT AXILLARY SENTINEL LYMPH NODE BIOPSY;  Surgeon: Alphonsa Overall, MD;  Location: [facility];  Service: General;  Laterality: Right;  . ovary tumor    . vocal cord nodules      Social History   Socioeconomic History  . Marital status: Married    Spouse name: [name]  . Number of children: Not on file  . Years of education: 12+  . Highest education level: Not on file  Occupational History  . Occupation: [employer]  Tobacco Use  . Smoking status: Never Smoker  . Smokeless tobacco: Never Used  Substance and Sexual Activity  . Alcohol use: No    Alcohol/week: 0.0 standard drinks  . Drug use: No  . Sexual activity: Yes  Other Topics Concern  .  Not on file  Social History Narrative   Lives at home with husband.   Caffeine use: 1-2 drinks per month         Epworth Sleepiness Scale = 15 (as of 01/01/15)   Social Determinants of Health   Financial Resource Strain:   . Difficulty of Paying Living Expenses: Not on file  Food Insecurity:   . Worried About Charity fundraiser in the Last Year: Not on file  . Ran Out of Food in the Last Year: Not on file  Transportation Needs:   . Lack of Transportation (Medical): Not on file  . Lack of Transportation (Non-Medical): Not on file  Physical Activity:   . Days of Exercise per Week: Not on file  . Minutes of Exercise per Session: Not on file  Stress:   . Feeling of Stress : Not on file  Social Connections:   . Frequency of Communication with Friends and Family: Not on file  . Frequency of Social Gatherings with Friends and Family: Not on file  . Attends Religious Services: Not on file  . Active Member of Clubs or Organizations: Not on file  . Attends Archivist Meetings: Not on file  . Marital Status: Not on file  Intimate Partner Violence:   . Fear of Current or  Ex-Partner: Not on file  . Emotionally Abused: Not on file  . Physically Abused: Not on file  . Sexually Abused: Not on file    Current Outpatient Medications on File Prior to Visit  Medication Sig Dispense Refill  . Accu-Chek Softclix Lancets lancets Use 2 times daily 200 each 2  . anastrozole (ARIMIDEX) 1 MG tablet Take 1 tablet (1 mg total) by mouth daily. 90 tablet 3  . Aspirin-Acetaminophen-Caffeine (GOODY HEADACHE PO) Take 1 packet by mouth as needed (for migraines).     . Biotin w/ Vitamins C & E (HAIR SKIN & NAILS GUMMIES PO) Take 1 tablet by mouth daily.     . Blood Glucose Monitoring Suppl (ACCU-CHEK AVIVA PLUS) w/Device KIT USE ONCE DAILY (Patient taking differently: 1 each by Other route 2 (two) times daily. ) 1 kit 0  . CHROMIUM PICOLINATE PO Take by mouth.    Mar[place] Kitchen glucose blood (ACCU-CHEK AVIVA  PLUS) test strip 1 each by Other route 2 (two) times a day. Use as instructed 200 each 2  . hydrALAZINE (APRESOLINE) 100 MG tablet TAKE 1 TABLET BY MOUTH 3  TIMES DAILY 270 tablet 3  . hydrochlorothiazide (HYDRODIURIL) 12.5 MG tablet TAKE 1 TABLET BY MOUTH  DAILY 90 tablet 2  . irbesartan (AVAPRO) 300 MG tablet Take 1 tablet (300 mg total) by mouth daily. 90 tablet 3  . Multiple Vitamins-Minerals (ONE-A-DAY WOMENS VITACRAVES) CHEW Chew 1 tablet by mouth daily.     . pravastatin (PRAVACHOL) 40 MG tablet TAKE 1 TABLET BY MOUTH  DAILY 90 tablet 0  . tizanidine (ZANAFLEX) 2 MG capsule Take 1 capsule (2 mg total) by mouth at bedtime as needed for muscle spasms. 15 capsule 0  . atenolol (TENORMIN) 25 MG tablet Take 1 tablet (25 mg total) by mouth 2 (two) times daily. 180 tablet 3   No current facility-administered medications on file prior to visit.    No Known Allergies  Family History  Problem Relation Age of Onset  . Stroke Mother   . Heart disease Father   . Asthma Father   . Cancer Father        colon  . Cancer Sister        lung  . Cancer Brother        colon  . Diabetes Brother   . Breast cancer Other 48    There were no vitals taken for this visit.  Review of Systems She denies hypoglycemia.      Objective:   Physical Exam   Lab Results  Component Value Date   CREATININE 1.30 (H) 12/31/2018   BUN 15 12/31/2018   NA 141 12/31/2018   K 4.1 12/31/2018   CL 105 12/31/2018   CO2 27 12/31/2018       Assessment & Plan:  Type 2 DM: She would benefit from increased rx, if it can be done with a regimen that avoids or minimizes hypoglycemia.  Renal insuff: This limits rx options.     Patient Instructions  I have sent a prescription to your pharmacy, to add Rybelsus, and to change Invokana to farxiga Please reduce the glipizide to 5 mg qam Please come back for a follow-up appointment in 1 month.

## 2019-01-05 ENCOUNTER — Encounter: Payer: Self-pay | Admitting: Internal Medicine

## 2019-01-12 ENCOUNTER — Other Ambulatory Visit: Payer: Self-pay | Admitting: Internal Medicine

## 2019-01-15 NOTE — Telephone Encounter (Signed)
Last filled 07/2018... please advise

## 2019-01-21 ENCOUNTER — Encounter: Payer: Medicare Other | Attending: Internal Medicine | Admitting: Registered"

## 2019-01-21 ENCOUNTER — Other Ambulatory Visit: Payer: Self-pay | Admitting: Cardiovascular Disease

## 2019-01-21 DIAGNOSIS — E119 Type 2 diabetes mellitus without complications: Secondary | ICD-10-CM | POA: Diagnosis present

## 2019-01-21 NOTE — Progress Notes (Signed)
Diabetes Self-Management Education  Visit Type:    Appt. Start Time: 0800 Appt. End Time: 0845  01/21/2019  [name], identified by name and date of birth, is a 64 y.o. female with a diagnosis of Diabetes:    ASSESSMENT  There were no vitals taken for this visit. There is no height or weight on file to calculate BMI.   This visit was completed via MyChart due to COVID-19 pandemic.   Pt was alone for visit. Pt has PMH of breast cancer, GERD, HLD, and HTN.   Pt reports things are going a lot better. Pt reports at last MD visit her glipizide was reduced to 5 mg, Invokana was changed to [place] and Rybelsus was added.  Pt reports highest fasting blood sugar over the past week was 133 and lowest reported 101; reports highest postprandial 157 and lowest 99. When discussing checking blood sugar after different meals, pt reports she has been checking after different meals but has not been putting down times. Reports she will start listing times for postprandial readings on log pt sends to dietitian. Pt reports having 1 low blood sugar of 68 on 12/25. Reports she was cooking and had not eating in a while. Reports she didn't have any symptoms and continued cooking. Pt reports her blood sugar goals for herself are fastings <90 and postprandial readings <140.   Pt reports feeling hungry all the time. She reports she talked to her doctor about it and was told this should improve with medication changes. Pt reports she was told to increase water intake, reports she feels good about getting in 4 bottles per day. Reports she will need to lay out another bottle to increase to 5. Pt reports she and her husband traveled recently and when she stopped at a gas station she chose a pack of vegetables and low fat ranch as her snack rather than chips, etc. She reports she tries to snack on nuts often when hungry. She reports she has started having a smoothie sometimes for breakfast which contains: 3 cups  [place] yogurt, 1/2 c almond milk, ~1.5 cups frozen berries/bananas, fresh spinach, ~1/4 nuts, and 1 apple (pt drinks ~1/3 for breakfast). Pt reports this makes about 3-3.5 servings (estimate of ~3 carbohydrate choices if making 3 servings).   Pt reports she has been including some indoor physical activities. Reports she and her husband are thinking about purchasing a treadmill. Reports she no longer gets out of breath with physical activity like she used to get.   Blood Glucose Readings x past week: Fasting: 101-133 Postprandial: 99-157  24-Hour Recall: Breakfast (AM): 1 cup smoothie (Greek yogurt, part of apple, nuts, almond milk, frozen strawberries/banana, spinach), fried egg, bacon, water Lunch (PM): onions, potatoes, cantaloupe, water Dinner (PM): Cookout: 2 fried chicken tenders, slaw, 1/4 order of fries (shared with husband), water, few sips of husband's sweet tea Snacks: nuts, ice cream (unsure when) Beverages: at least 4 bottles of water   Physical Activity: Pt includes some indoor physical activities-15 minutes most days.  Individualized Plan for Diabetes Self-Management Training:   Learning Objective:  Patient will have a greater understanding of diabetes self-management. Patient education plan is to attend individual and/or group sessions per assessed needs and concerns.   Plan: Dietitian praised pt for continued efforts to make healthy changes. Reviewed tx of hypoglycemia and importance of treating any blood sugars under 70. Recommended having raw vegetables if feeling hungry soon after eating. Discussed if extended period since last meal (4  or more hours) want to choose a balanced snack with protein and carbohydrate but if feeling hungry sooner- choosing vegetables or protein as snack. Dietitian to mail pt booklet with ADA recommended indoor physical activities. Discussed that even though pt has her own blood sugar goals, having fasting under 130 and postprandial under 180 is  considered good and pt should feel good about how well her numbers have been over the past week even if not exactly where pt would like for them to be yet. Pt appeared agreeable to plan discussed.  Nutrition Goals/Recommendations:  -Continue working to eat consistently 3 meals/every 3-5 hours.  -Continue working to have consistent amount of carbohydrates at each meal: 2-3 carbohydrate choices per meal (30-45 g carbohydrates) and also including protein and vegetables for balance.   -For snacks, recommend 1 carbohydrate choice and including a protein. If feeling hungry shortly after eating (within 3 hours of meal) recommend choosing  raw vegetables or a protein as snack.  -Continue trying to avoid eating within 3 hours of when you are lying down.  -Continue with checking blood sugar in the morning (fasting) and 1-2 hours after a meal. Include time you checked blood sugar on log.    -Continue tracking foods and blood sugar-you are doing a great job with tracking!!  -If you have a low blood sugar (70 or less) use 15/15 rule to treat it:   Eat 15g fast acting carbohydrate such as 1/2 cup juice or regular soda, 1 TBSP sugar or regular syrup, or 15g carb of glucose tablets  Wait 15 minutes and recheck blood sugar. If still below 70 repeat process until blood sugar is above 70  Once above 70, eat meal or snack within 30 minutes to keep it within range  -Indoor exercise booklet to be mailed to you.   Patient Instructions  Nutrition Goals/Recommendations:  -Continue working to eat consistently 3 meals/every 3-5 hours.  -Continue working to have consistent amount of carbohydrates at each meal: 2-3 carbohydrate choices per meal (30-45 g carbohydrates) and also including protein and vegetables for balance.   -For snacks, recommend 1 carbohydrate choice and including a protein. If feeling hungry shortly after eating (within 3 hours of meal) recommend choosing  raw vegetables or a protein as  snack.  -Continue trying to avoid eating within 3 hours of when you are lying down.  -Continue with checking blood sugar in the morning (fasting) and 1-2 hours after a meal. Include time you checked blood sugar on log.    -Continue tracking foods and blood sugar-you are doing a great job with tracking!!  -If you have a low blood sugar (70 or less) use 15/15 rule to treat it:   Eat 15g fast acting carbohydrate such as 1/2 cup juice or regular soda, 1 TBSP sugar or regular syrup, or 15g carb of glucose tablets  Wait 15 minutes and recheck blood sugar. If still below 70 repeat process until blood sugar is above 70  Once above 70, eat meal or snack within 30 minutes to keep it within range  -Indoor exercise booklet to be mailed to you.       If problems or questions, patient to contact team via:  Phone and Email  Future DSME appointment:  1 month.

## 2019-01-27 ENCOUNTER — Encounter: Payer: Self-pay | Admitting: Registered"

## 2019-01-27 NOTE — Patient Instructions (Signed)
Nutrition Goals/Recommendations:  -Continue working to eat consistently 3 meals/every 3-5 hours.  -Continue working to have consistent amount of carbohydrates at each meal: 2-3 carbohydrate choices per meal (30-45 g carbohydrates) and also including protein and vegetables for balance.   -For snacks, recommend 1 carbohydrate choice and including a protein. If feeling hungry shortly after eating (within 3 hours of meal) recommend choosing  raw vegetables or a protein as snack.  -Continue trying to avoid eating within 3 hours of when you are lying down.  -Continue with checking blood sugar in the morning (fasting) and 1-2 hours after a meal. Include time you checked blood sugar on log.    -Continue tracking foods and blood sugar-you are doing a great job with tracking!!  -If you have a low blood sugar (70 or less) use 15/15 rule to treat it:   Eat 15g fast acting carbohydrate such as 1/2 cup juice or regular soda, 1 TBSP sugar or regular syrup, or 15g carb of glucose tablets  Wait 15 minutes and recheck blood sugar. If still below 70 repeat process until blood sugar is above 70  Once above 70, eat meal or snack within 30 minutes to keep it within range  -Indoor exercise booklet to be mailed to you.

## 2019-02-06 ENCOUNTER — Encounter: Payer: Self-pay | Admitting: Hematology and Oncology

## 2019-02-06 ENCOUNTER — Other Ambulatory Visit: Payer: Self-pay

## 2019-02-08 ENCOUNTER — Ambulatory Visit: Payer: Medicare Other | Admitting: Endocrinology

## 2019-02-08 ENCOUNTER — Other Ambulatory Visit: Payer: Self-pay

## 2019-02-08 ENCOUNTER — Encounter: Payer: Self-pay | Admitting: Endocrinology

## 2019-02-08 VITALS — BP 124/80 | HR 107 | Ht 65.0 in | Wt 224.6 lb

## 2019-02-08 DIAGNOSIS — E119 Type 2 diabetes mellitus without complications: Secondary | ICD-10-CM

## 2019-02-08 LAB — BASIC METABOLIC PANEL
BUN: 19 mg/dL (ref 6–23)
CO2: 29 mEq/L (ref 19–32)
Calcium: 9.8 mg/dL (ref 8.4–10.5)
Chloride: 104 mEq/L (ref 96–112)
Creatinine, Ser: 1.24 mg/dL — ABNORMAL HIGH (ref 0.40–1.20)
GFR: 52.4 mL/min — ABNORMAL LOW (ref >60.00)
Glucose, Bld: 114 mg/dL — ABNORMAL HIGH (ref 70–99)
Potassium: 4 mEq/L (ref 3.5–5.1)
Sodium: 141 mEq/L (ref 135–145)

## 2019-02-08 LAB — POCT GLYCOSYLATED HEMOGLOBIN (HGB A1C): Hemoglobin A1C: 6.4 % — AB (ref 4.0–5.6)

## 2019-02-08 LAB — TSH: TSH: 3.06 u[IU]/mL (ref 0.35–4.50)

## 2019-02-08 MED ORDER — RYBELSUS 7 MG PO TABS: 7.0000 mg | ORAL_TABLET | Freq: Every day | ORAL | 11 refills | Status: DC

## 2019-02-08 NOTE — Patient Instructions (Addendum)
I have sent a prescription to your pharmacy, to increase the Rybelsus.  Please stop taking the glipizide.   Please continue the same [place].  Blood tests are requested for you today.  We'll let you know about the results.  Please come back for a follow-up appointment in 3 months.

## 2019-02-08 NOTE — Progress Notes (Signed)
Subjective:    Patient ID: Carrie Singh, female    DOB: 10-10-1955, 64 y.o.   MRN: [id number]  HPI Pt returns for f/u of diabetes mellitus: DM type: 2 Dx'ed: 6720 Complications: renal insuff.   Therapy: 3 oral meds GDM: never DKA: never Severe hypoglycemia: never Pancreatitis: never Pancreatic imaging: normal on 2010 [place].  Other: she has never been on insulin; renal insuff limits rx options Interval history: Pt says cbg varies from 68-170.  pt states she feels well in general.  She takes meds as rx'ed.  Past Medical History:  Diagnosis Date  . Breast cancer ([place])   . Breast cancer, right breast ([place]) 12/2007   a. s/p chemo, radiation, and lumpectomy with negative sentinel lymph node biopsy   . Depression   . Diabetes mellitus without complication ([place])   . GERD (gastroesophageal reflux disease)   . Hyperlipidemia   . Hypertension    resistant, negative renal Dopplers and negative CT angiogram  . Migraines   . Obesity   . Seizures ([place])    last seizure  1 week ago: states she jumps for a second and it's gone    Past Surgical History:  Procedure Laterality Date  . ABDOMINAL HYSTERECTOMY    . BREAST LUMPECTOMY    . CYST EXCISION    . FOOT SURGERY    . MASTECTOMY W/ SENTINEL NODE BIOPSY Right 03/18/2016   Procedure: RIGHT MASTECTOMY WITH RIGHT AXILLARY SENTINEL LYMPH NODE BIOPSY;  Surgeon: Alphonsa Overall, MD;  Location: [facility];  Service: General;  Laterality: Right;  . ovary tumor    . vocal cord nodules      Social History   Socioeconomic History  . Marital status: Married    Spouse name: [name]  . Number of children: Not on file  . Years of education: 12+  . Highest education level: Not on file  Occupational History  . Occupation: [employer]  Tobacco Use  . Smoking status: Never Smoker  . Smokeless tobacco: Never Used  Substance and Sexual Activity  . Alcohol use: No    Alcohol/week: 0.0 standard drinks  . Drug use: No  . Sexual activity: Yes  Other Topics Concern   . Not on file  Social History Narrative   Lives at home with husband.   Caffeine use: 1-2 drinks per month         Epworth Sleepiness Scale = 15 (as of 01/01/15)   Social Determinants of Health   Financial Resource Strain:   . Difficulty of Paying Living Expenses: Not on file  Food Insecurity:   . Worried About Charity fundraiser in the Last Year: Not on file  . Ran Out of Food in the Last Year: Not on file  Transportation Needs:   . Lack of Transportation (Medical): Not on file  . Lack of Transportation (Non-Medical): Not on file  Physical Activity:   . Days of Exercise per Week: Not on file  . Minutes of Exercise per Session: Not on file  Stress:   . Feeling of Stress : Not on file  Social Connections:   . Frequency of Communication with Friends and Family: Not on file  . Frequency of Social Gatherings with Friends and Family: Not on file  . Attends Religious Services: Not on file  . Active Member of Clubs or Organizations: Not on file  . Attends Archivist Meetings: Not on file  . Marital Status: Not on file  Intimate Partner Violence:   .  Fear of Current or Ex-Partner: Not on file  . Emotionally Abused: Not on file  . Physically Abused: Not on file  . Sexually Abused: Not on file    Current Outpatient Medications on File Prior to Visit  Medication Sig Dispense Refill  . Accu-Chek Softclix Lancets lancets Use 2 times daily 200 each 2  . anastrozole (ARIMIDEX) 1 MG tablet Take 1 tablet (1 mg total) by mouth daily. 90 tablet 3  . Biotin w/ Vitamins C & E (HAIR SKIN & NAILS GUMMIES PO) Take 1 tablet by mouth daily.     . Blood Glucose Monitoring Suppl (ACCU-CHEK AVIVA PLUS) w/Device KIT USE ONCE DAILY (Patient taking differently: 1 each by Other route 2 (two) times daily. ) 1 kit 0  . CHROMIUM PICOLINATE PO Take by mouth.    . dapagliflozin propanediol (FARXIGA) 10 MG TABS tablet Take 10 mg by mouth daily before breakfast. 30 tablet 11  . glucose blood  (ACCU-CHEK AVIVA PLUS) test strip 1 each by Other route 2 (two) times a day. Use as instructed 200 each 2  . hydrALAZINE (APRESOLINE) 100 MG tablet Take 1 tablet (100 mg total) by mouth 3 (three) times daily. NEED OV. 270 tablet 0  . irbesartan (AVAPRO) 300 MG tablet Take 1 tablet (300 mg total) by mouth daily. 90 tablet 3  . Multiple Vitamins-Minerals (ONE-A-DAY WOMENS VITACRAVES) CHEW Chew 1 tablet by mouth daily.     . pravastatin (PRAVACHOL) 40 MG tablet TAKE 1 TABLET BY MOUTH  DAILY 90 tablet 0  . tizanidine (ZANAFLEX) 2 MG capsule Take 1 capsule (2 mg total) by mouth at bedtime as needed for muscle spasms. 15 capsule 0  . atenolol (TENORMIN) 25 MG tablet Take 1 tablet (25 mg total) by mouth 2 (two) times daily. 180 tablet 3   No current facility-administered medications on file prior to visit.    No Known Allergies  Family History  Problem Relation Age of Onset  . Stroke Mother   . Heart disease Father   . Asthma Father   . Cancer Father        colon  . Cancer Sister        lung  . Cancer Brother        colon  . Diabetes Brother   . Breast cancer Other 48    BP 124/80 (BP Location: Left Wrist, Patient Position: Sitting, Cuff Size: Large)   Pulse (!) 107   Ht 5' 5"  (1.651 m)   Wt 224 lb 9.6 oz (101.9 kg)   SpO2 98%   BMI 37.38 kg/m    Review of Systems Denies LOC    Objective:   Physical Exam VITAL SIGNS:  See vs page GENERAL: no distress Pulses: dorsalis pedis intact bilat.   MSK: no deformity of the feet CV: trace bilat leg edema Skin:  no ulcer on the feet.  normal color and temp on the feet. Old healed surgical scar on the right foot. Neuro: sensation is intact to touch on the feet  Lab Results  Component Value Date   HGBA1C 6.4 (A) 02/08/2019   Lab Results  Component Value Date   CREATININE 1.30 (H) 12/31/2018   BUN 15 12/31/2018   NA 141 12/31/2018   K 4.1 12/31/2018   CL 105 12/31/2018   CO2 27 12/31/2018       Assessment & Plan:  Obesity:  I advised surgery, but she declines.  Type 2 DM, with renal insuff: goal is A1c <  7, and off glipizide Tachycardia: check TSH  Patient Instructions  I have sent a prescription to your pharmacy, to increase the Rybelsus.  Please stop taking the glipizide.   Please continue the same [place].  Blood tests are requested for you today.  We'll let you know about the results.  Please come back for a follow-up appointment in 3 months.

## 2019-02-21 ENCOUNTER — Encounter: Payer: Self-pay | Admitting: Internal Medicine

## 2019-02-25 ENCOUNTER — Encounter: Payer: Medicare Other | Attending: Internal Medicine | Admitting: Registered"

## 2019-02-25 DIAGNOSIS — E119 Type 2 diabetes mellitus without complications: Secondary | ICD-10-CM | POA: Diagnosis not present

## 2019-02-25 NOTE — Progress Notes (Signed)
Diabetes Self-Management Education  Visit Type:    Appt. Start Time: 0800 Appt. End Time: 0845  01/21/2019  Carrie Singh, identified by name and date of birth, is a 64 y.o. female with a diagnosis of Diabetes:    ASSESSMENT  There were no vitals taken for this visit. There is no height or weight on file to calculate BMI.   This visit was completed via MyChart due to COVID-19 pandemic.   Pt was alone for visit. Pt has PMH of breast cancer, GERD, HLD, and HTN.   Pt reports she has mostly been at home. Reports she just started increased dose of Rybelsus last week and she does not like it. Reports experiencing cramping and constipation since starting the increased dosage. Pt reports she talked with her pharmacist about side effects and was advised to give it a week and call her doctor if symptoms remained after that period. Pt reports reduced appetite/earlier satiety since medication change and having a harder time getting in enough water due to feeling more full than usual. Pt reports she has been getting in 2-3 bottles of water daily and 1 small low sodium V8 ~5 oz. Reports she has been trying to include more spinach, beans to help with constipation.   Pt has a question about whether she can have a chocolate milkshake from [employer] sometimes. Reports she does not have it often.   Pt reports she has lost 10 lb since started nutrition appointments. Reports weighing 219 lb yesterday. Reports last A1c was 6.4. Pt reports she and her endocrinologist were very happy to see that number.   Blood Glucose Readings x Past Week: Fasting: 100-120 Postprandial: 119-150 Denies any episodes of hypoglycemia.  02/08/19: Hgb A1c: 6.4  24-Hour Recall: Breakfast (AM): cucumber, boiled egg Lunch (PM): fruit cup, 1/2 banana Dinner (PM): BBQ chicken, vegetable casserole, some salad Snacks: peanut butter with piece of raisin bread after dinner; pistachios (unsure when) Beverages: 2-3 bottles water,  V8 low sodium 5 oz  Physical Activity: Pt includes some indoor physical activities-30 minutes most days.  Individualized Plan for Diabetes Self-Management Training:   Learning Objective:  Patient will have a greater understanding of diabetes self-management. Patient education plan is to attend individual and/or group sessions per assessed needs and concerns.   Plan: Dietitian praised pt for continued efforts to make healthy changes and praised last A1c of 6.4. Discussed nutrition to help with constipation and importance of ensuring proper hydration. Discussed working to add 1 more bottle of water-gradually sipping water throughout the day. Discussed ensuring we have balanced of food groups at meals to maintain blood sugar. With increase in Rybelsus this does increase risk for hypoglycemia and thus, increases importance of consistent carbohydrate intake. Discussed that milkshakes at [facility] are very high in carbohydrates with each being much more than recommended carbohydrates per meal-if wanting one in moderation recommended having half of a small at a time (85 g whole small shake, 43 g half of small) and having protein with it.   Nutrition Goals/Recommendations:  -Continue working to eat consistently 3 meals/every 3-5 hours.  -Continue working to have consistent amount of carbohydrates at each meal: 2-3 carbohydrate choices per meal (30-45 g carbohydrates)   Important to ensure we have a balance of 30-45 g carbohydrates along with protein and vegetables at meals to balance blood sugar and prevent lows or spikes.   -Try to include at least 4 bottles of water daily to ensure adequate hydration and help with constipation. See handout  for additional foods to add to help with constipation.   -Continue trying to avoid eating within 3 hours of when you are laying down.  -Continue with checking blood sugar in the morning (fasting) and 1-2 hours after a meal. Include time you checked blood sugar  on log.    -Continue tracking foods and blood sugar-you are doing a great job with tracking!!  -If you have a low blood sugar (70 or less) use 15/15 rule to treat it:   Eat 15g fast acting carbohydrate such as 1/2 cup juice or regular soda, 1 TBSP sugar or regular syrup, or 15g carb of glucose tablets  Wait 15 minutes and recheck blood sugar. If still below 70 repeat process until blood sugar is above 70  Once above 70, eat balanced meal or snack within 30 minutes to keep it within range  If you continue to have negative side effects with new increased dose of Rybelsus, recommend discussing with your doctor.   You are doing a fabulous job and your recent A1c of 6.4 shows your progress! You can continue to do great things!  Patient Instructions  Nutrition Goals/Recommendations:  -Continue working to eat consistently 3 meals/every 3-5 hours.  -Continue working to have consistent amount of carbohydrates at each meal: 2-3 carbohydrate choices per meal (30-45 g carbohydrates)   Important to ensure we have a balance of 30-45 g carbohydrates along with protein and vegetables at meals to balance blood sugar and prevent lows or spikes.   -Try to include at least 4 bottles of water daily to ensure adequate hydration and help with constipation. See handout for additional foods to add to help with constipation.   -Continue trying to avoid eating within 3 hours of when you are laying down.  -Continue with checking blood sugar in the morning (fasting) and 1-2 hours after a meal. Include time you checked blood sugar on log.    -Continue tracking foods and blood sugar-you are doing a great job with tracking!!  -If you have a low blood sugar (70 or less) use 15/15 rule to treat it:   Eat 15g fast acting carbohydrate such as 1/2 cup juice or regular soda, 1 TBSP sugar or regular syrup, or 15g carb of glucose tablets  Wait 15 minutes and recheck blood sugar. If still below 70 repeat process until  blood sugar is above 70  Once above 70, eat balanced meal or snack within 30 minutes to keep it within range  If you continue to have negative side effects with new increased dose of Rybelsus, recommend discussing with your doctor.   You are doing a fabulous job and your recent A1c of 6.4 shows your progress! You can continue to do great things!       If problems or questions, patient to contact team via:  Phone and Email  Future DSME appointment:  1 month.

## 2019-02-26 ENCOUNTER — Encounter: Payer: Self-pay | Admitting: Internal Medicine

## 2019-02-26 ENCOUNTER — Ambulatory Visit: Payer: Medicare Other | Admitting: Podiatry

## 2019-03-03 ENCOUNTER — Encounter: Payer: Self-pay | Admitting: Registered"

## 2019-03-03 NOTE — Patient Instructions (Addendum)
Nutrition Goals/Recommendations:  -Continue working to eat consistently 3 meals/every 3-5 hours.  -Continue working to have consistent amount of carbohydrates at each meal: 2-3 carbohydrate choices per meal (30-45 g carbohydrates)   Important to ensure we have a balance of 30-45 g carbohydrates along with protein and vegetables at meals to balance blood sugar and prevent lows or spikes.   -Try to include at least 4 bottles of water daily to ensure adequate hydration and help with constipation. See handout for additional foods to add to help with constipation.   -Continue trying to avoid eating within 3 hours of when you are laying down.  -Continue with checking blood sugar in the morning (fasting) and 1-2 hours after a meal. Include time you checked blood sugar on log.    -Continue tracking foods and blood sugar-you are doing a great job with tracking!!  -If you have a low blood sugar (70 or less) use 15/15 rule to treat it:   Eat 15g fast acting carbohydrate such as 1/2 cup juice or regular soda, 1 TBSP sugar or regular syrup, or 15g carb of glucose tablets  Wait 15 minutes and recheck blood sugar. If still below 70 repeat process until blood sugar is above 70  Once above 70, eat balanced meal or snack within 30 minutes to keep it within range  If you continue to have negative side effects with new increased dose of Rybelsus, recommend discussing with your doctor.   You are doing a fabulous job and your recent A1c of 6.4 shows your progress! You can continue to do great things!

## 2019-03-16 ENCOUNTER — Encounter: Payer: Self-pay | Admitting: Hematology and Oncology

## 2019-03-21 ENCOUNTER — Other Ambulatory Visit: Payer: Self-pay | Admitting: Nephrology

## 2019-03-21 DIAGNOSIS — N183 Chronic kidney disease, stage 3 unspecified: Secondary | ICD-10-CM

## 2019-03-21 DIAGNOSIS — D631 Anemia in chronic kidney disease: Secondary | ICD-10-CM | POA: Diagnosis not present

## 2019-03-21 DIAGNOSIS — N189 Chronic kidney disease, unspecified: Secondary | ICD-10-CM

## 2019-03-21 DIAGNOSIS — I129 Hypertensive chronic kidney disease with stage 1 through stage 4 chronic kidney disease, or unspecified chronic kidney disease: Secondary | ICD-10-CM | POA: Diagnosis not present

## 2019-03-21 DIAGNOSIS — E1122 Type 2 diabetes mellitus with diabetic chronic kidney disease: Secondary | ICD-10-CM

## 2019-03-21 DIAGNOSIS — N1831 Chronic kidney disease, stage 3a: Secondary | ICD-10-CM | POA: Diagnosis not present

## 2019-03-22 ENCOUNTER — Ambulatory Visit: Payer: Medicare Other | Attending: Internal Medicine

## 2019-03-22 DIAGNOSIS — Z23 Encounter for immunization: Secondary | ICD-10-CM

## 2019-03-22 NOTE — Progress Notes (Signed)
   Covid-19 Vaccination Clinic  Name:  Jasha Brozek    MRN: [id number] DOB: 07-Jun-1955  03/22/2019  Ms. Current was observed post Covid-19 immunization for 15 minutes without incident. She was provided with Vaccine Information Sheet and instruction to access the V-Safe system.   Ms. Parke was instructed to call 911 with any severe reactions post vaccine: Marland Kitchen Difficulty breathing  . Swelling of face and throat  . A fast heartbeat  . A bad rash all over body  . Dizziness and weakness   Immunizations Administered    Name Date Dose VIS Date Route   Pfizer COVID-19 Vaccine 03/22/2019  9:12 AM 0.3 mL 12/28/2018 Intramuscular   Manufacturer: [place]   Lot: [id number]   [place]: [id number]

## 2019-03-28 ENCOUNTER — Encounter: Payer: Medicare Other | Attending: Internal Medicine | Admitting: Registered"

## 2019-03-28 DIAGNOSIS — E119 Type 2 diabetes mellitus without complications: Secondary | ICD-10-CM

## 2019-03-28 NOTE — Progress Notes (Signed)
Diabetes Self-Management Education  Visit Type:    Appt. Start Time: 0830 Appt. End Time: [id number]  03/28/2019  [name], identified by name and date of birth, is a 64 y.o. female with a diagnosis of Diabetes:    ASSESSMENT  There were no vitals taken for this visit. There is no height or weight on file to calculate BMI.   This visit was completed via MyChart due to COVID-19 pandemic.   Pt was alone for visit. Pt has PMH of breast cancer, GERD, HLD, and HTN.   Pt reports feeling down due to recent dx of CKD3. Reports she will be going for an ultrasound. Pt reports on 03/09 her nurse said she had glucose in her urine. Reports some blood sugar readings higher than they have been recently but denies any readings above or near 200. Pt is concerned about elevated fasting blood sugars which have been between 120-152 over ~past week.  Reports having less food at meals. Reports having a full plate in the evening. Was previously eating less, about half as much. Reports eating more fruit. Trying to get in more fiber due to having constipation since starting Rybelsus. Reports she drinks 3-4 x 16 oz water daily. Reports constipation about the same. Reports appetite has been good "too good." However, pt reports she has lost some weight.   Reports she baked husband a cake earlier this week for his birthday and she would take a taste, less than a slice at a time. After February 28 her blood sugar started increasing. Reports her weight was 215.6 lb. Pt reports feeling her dx of CKD stage 3 is her fault because she took [facility] powder in the past often for severe headaches. Reports she was taking 20 mg vitamin D with her 600 mg calcium along with 5000 IU vitamin D and was told by the nurse with her kidney doctor not to take it any longer due to kidney concern.   Reports sometimes feeling shaky if she goes too long without eating. Reports not eating in the morning for while. May have V8 and fruit within 30  minutes to 1 hour after waking but won't eat anything else for several hours. Reports she usually wakes around 5-530 AM and may not eat a whole meal until 10 or so. Doesn't eat within 3 hours of going to bed.   Blood Glucose Readings x Past Week: Fasting: 120-152 Postprandial: 122-187 Denies any episodes of hypoglycemia.  02/08/19: Hgb A1c: 6.4  24-Hour Recall: Breakfast (1130-12): 5.5 oz V8 juice, boiled egg, cantaloupe Lunch (3PM): salmon, broccoli, 3 crackers  Snk (PM): handful chips, handful unsalted nuts Dinner (7-730PM): salmon, few crackers, green beans Snk (PM): piece of cake size of thumb  Beverages: 2-3 bottles water, V8 low sodium 5 oz  Physical Activity: Pt includes some indoor physical activities-30 minutes most days.  Individualized Plan for Diabetes Self-Management Training:   Learning Objective:  Patient will have a greater understanding of diabetes self-management. Patient education plan is to attend individual and/or group sessions per assessed needs and concerns.   Plan: Dietitian encouraged pt to not allow CKD 3 dx get her down and that dx is not pt's fault. Discussed that stress can raise blood sugar and also discussed working to have consistent intake of carbohydrates at evening meal-not having too little or too much as pt reports often having very little carbohydrate at evening meal as that could be causing elevated blood sugar the next morning. Also encouraged eating a balanced  snack within 1-1.5 hours of waking to prevent blood sugar getting too low due to extended period without eating. If having a fruit, ensuring to have protein with it. Discussed focusing on having at least 4 bottles (64 oz) water daily to help with constipation with increase in Rybelsus as this will likely be more helpful than increasing fiber further as Rybelsus slows down digestion and thus may lead to stool becoming more dry over time causing constipation. Discussed that pt's last vitamin D  level was WNL, encouraged her to discuss vitamin D and any other supplements with her renal doctor. Pt appeared agreeable to information/goals discussed.   Nutrition Goals/Recommendations:  -Continue working to eat consistently 3 meals/every 3-5 hours.  -Continue working to have consistent amount of carbohydrates at each meal: 2-3 carbohydrate choices per meal (30-45 g carbohydrates)   Have a balanced snack (see handout) within 1-1.5 hours of waking to help prevent low blood sugars.  Have 2 carbohydrate choices at dinner (30 g carbohydrate) along with a protein and vegetables for balance.   -Try to include at least 4 bottles of water daily to ensure adequate hydration and help with constipation.   -Continue trying to avoid eating within 3 hours of when you are laying down.  -Continue with checking blood sugar in the morning (fasting) and 1-2 hours after a meal. Include time you checked blood sugar on log.    -Continue tracking foods and blood sugar-you continue to do a great job!  -If you have a low blood sugar (70 or less) use 15/15 rule to treat it:   Eat 15g fast acting carbohydrate such as 1/2 cup juice or regular soda, 1 TBSP sugar or regular syrup, or 15g carb of glucose tablets  Wait 15 minutes and recheck blood sugar. If still below 70 repeat process until blood sugar is above 70  Once above 70, eat balanced meal or snack within 30 minutes to keep it within range  Patient Instructions  Nutrition Goals/Recommendations:  -Continue working to eat consistently 3 meals/every 3-5 hours.  -Continue working to have consistent amount of carbohydrates at each meal: 2-3 carbohydrate choices per meal (30-45 g carbohydrates)   Have a balanced snack (see handout) within 1-1.5 hours of waking to help prevent low blood sugars.  Have 2 carbohydrate choices at dinner (30 g carbohydrate) along with a protein and vegetables for balance.   -Try to include at least 4 bottles of water daily  to ensure adequate hydration and help with constipation.   -Continue trying to avoid eating within 3 hours of when you are laying down.  -Continue with checking blood sugar in the morning (fasting) and 1-2 hours after a meal. Include time you checked blood sugar on log.    -Continue tracking foods and blood sugar-you continue to do a great job!  -If you have a low blood sugar (70 or less) use 15/15 rule to treat it:   Eat 15g fast acting carbohydrate such as 1/2 cup juice or regular soda, 1 TBSP sugar or regular syrup, or 15g carb of glucose tablets  Wait 15 minutes and recheck blood sugar. If still below 70 repeat process until blood sugar is above 70  Once above 70, eat balanced meal or snack within 30 minutes to keep it within range         If problems or questions, patient to contact team via:  Phone and Email  Future DSME appointment:  1 month.

## 2019-03-29 ENCOUNTER — Ambulatory Visit
Admission: RE | Admit: 2019-03-29 | Discharge: 2019-03-29 | Disposition: A | Discharge status: Home / Self Care | Payer: Medicare Other | Source: Ambulatory Visit | Attending: Nephrology | Admitting: Nephrology

## 2019-03-29 ENCOUNTER — Other Ambulatory Visit: Payer: Self-pay | Admitting: Hematology and Oncology

## 2019-03-29 ENCOUNTER — Other Ambulatory Visit: Payer: Self-pay

## 2019-03-29 ENCOUNTER — Other Ambulatory Visit: Payer: Self-pay | Admitting: Cardiovascular Disease

## 2019-03-29 ENCOUNTER — Other Ambulatory Visit: Payer: Self-pay | Admitting: Internal Medicine

## 2019-03-29 DIAGNOSIS — N1831 Chronic kidney disease, stage 3a: Secondary | ICD-10-CM | POA: Insufficient documentation

## 2019-03-29 DIAGNOSIS — N189 Chronic kidney disease, unspecified: Secondary | ICD-10-CM

## 2019-03-29 DIAGNOSIS — N183 Chronic kidney disease, stage 3 unspecified: Secondary | ICD-10-CM | POA: Diagnosis not present

## 2019-03-29 DIAGNOSIS — E1122 Type 2 diabetes mellitus with diabetic chronic kidney disease: Secondary | ICD-10-CM | POA: Insufficient documentation

## 2019-03-29 DIAGNOSIS — D631 Anemia in chronic kidney disease: Secondary | ICD-10-CM | POA: Insufficient documentation

## 2019-03-29 DIAGNOSIS — N281 Cyst of kidney, acquired: Secondary | ICD-10-CM | POA: Diagnosis not present

## 2019-04-02 ENCOUNTER — Encounter: Payer: Self-pay | Admitting: Registered"

## 2019-04-02 NOTE — Patient Instructions (Addendum)
Nutrition Goals/Recommendations:  -Continue working to eat consistently 3 meals/every 3-5 hours.  -Continue working to have consistent amount of carbohydrates at each meal: 2-3 carbohydrate choices per meal (30-45 g carbohydrates)   Have a balanced snack (see handout) within 1-1.5 hours of waking to help prevent low blood sugars.  Have 2 carbohydrate choices at dinner (30 g carbohydrate) along with a protein and vegetables for balance.   -Try to include at least 4 bottles of water daily to ensure adequate hydration and help with constipation.   -Continue trying to avoid eating within 3 hours of when you are laying down.  -Continue with checking blood sugar in the morning (fasting) and 1-2 hours after a meal. Include time you checked blood sugar on log.    -Continue tracking foods and blood sugar-you continue to do a great job!  -If you have a low blood sugar (70 or less) use 15/15 rule to treat it:   Eat 15g fast acting carbohydrate such as 1/2 cup juice or regular soda, 1 TBSP sugar or regular syrup, or 15g carb of glucose tablets  Wait 15 minutes and recheck blood sugar. If still below 70 repeat process until blood sugar is above 70  Once above 70, eat balanced meal or snack within 30 minutes to keep it within range

## 2019-04-03 ENCOUNTER — Telehealth: Payer: Self-pay | Admitting: Hematology and Oncology

## 2019-04-03 NOTE — Telephone Encounter (Signed)
Scheduled appt per 3/12 sch message - pt aware of appt

## 2019-04-05 ENCOUNTER — Encounter: Payer: Self-pay | Admitting: Registered"

## 2019-04-05 ENCOUNTER — Telehealth: Payer: Self-pay | Admitting: Registered"

## 2019-04-05 NOTE — Telephone Encounter (Addendum)
Dietitian sent the following email to patient:   Carrie Singh,   I hope you are doing well. I have below the main things we talked about last week when we met. I will be sending it in the mail as well.  Nutrition Goals/Recommendations:  -Continue working to eat consistently 3 meals/every 3-5 hours.  -Continue working to have consistent amount of carbohydrates at each meal: 2-3 carbohydrate choices per meal (30-45 g carbohydrates)   Have a balanced snack (see handout) within 1-1.5 hours of waking to help prevent low blood sugars.  Have 2 carbohydrate choices at dinner (30 g carbohydrate) along with a protein and vegetables for balance.   -Try to include at least 4 bottles of water daily to ensure adequate hydration to help with constipation.   -Continue trying to avoid eating within 3 hours of when you are laying down.   -Continue with checking blood sugar in the morning (fasting) and 1-2 hours after a meal. Include time you checked blood sugar on log.    -Continue tracking foods and blood sugar-you continue to do a great job!  -If you have a low blood sugar (70 or less) use 15/15 rule to treat it:   Eat 15g fast acting carbohydrate such as 1/2 cup juice or regular soda, 1 TBSP sugar or regular syrup, or 15g carb of glucose tablets  Wait 15 minutes and recheck blood sugar. If still below 70 repeat process until blood sugar is above 70 Once above 70, eat balanced meal or snack within 30 minutes to keep it within range  Please let me know if you have any questions or concerns.  Regards,  Waymond Meador   [name], MS, RDN, [place] Registered Dietitian I Phone: [phone]  Fax: [phone] Website: [email or web address]

## 2019-04-12 ENCOUNTER — Ambulatory Visit: Payer: Medicare Other | Attending: Internal Medicine

## 2019-04-12 DIAGNOSIS — Z23 Encounter for immunization: Secondary | ICD-10-CM

## 2019-04-12 NOTE — Progress Notes (Signed)
   Covid-19 Vaccination Clinic  Name:  Carrie Singh    MRN: [id number] DOB: 10-30-1955  04/12/2019  [name] was observed post Covid-19 immunization for 15 minutes without incident. She was provided with Vaccine Information Sheet and instruction to access the V-Safe system.   [name] was instructed to call 911 with any severe reactions post vaccine: Marland Kitchen Difficulty breathing  . Swelling of face and throat  . A fast heartbeat  . A bad rash all over body  . Dizziness and weakness   Immunizations Administered    Name Date Dose VIS Date Route   Pfizer COVID-19 Vaccine 04/12/2019  8:57 AM 0.3 mL 12/28/2018 Intramuscular   Manufacturer: [place]   Lot: [id number]   [place]: [id number]

## 2019-04-16 ENCOUNTER — Encounter: Payer: Self-pay | Admitting: Endocrinology

## 2019-04-16 NOTE — Telephone Encounter (Signed)
Please review pt concern and advise 

## 2019-04-16 NOTE — Telephone Encounter (Signed)
Please advise 

## 2019-04-23 ENCOUNTER — Other Ambulatory Visit: Payer: Self-pay

## 2019-04-25 ENCOUNTER — Ambulatory Visit (INDEPENDENT_AMBULATORY_CARE_PROVIDER_SITE_OTHER): Payer: Medicare Other | Admitting: Endocrinology

## 2019-04-25 ENCOUNTER — Encounter: Payer: Self-pay | Admitting: Endocrinology

## 2019-04-25 ENCOUNTER — Other Ambulatory Visit: Payer: Self-pay

## 2019-04-25 VITALS — BP 132/88 | HR 104 | Ht 65.0 in | Wt 212.0 lb

## 2019-04-25 DIAGNOSIS — E119 Type 2 diabetes mellitus without complications: Secondary | ICD-10-CM | POA: Diagnosis not present

## 2019-04-25 LAB — POCT GLYCOSYLATED HEMOGLOBIN (HGB A1C): Hemoglobin A1C: 6.2 % — AB (ref 4.0–5.6)

## 2019-04-25 MED ORDER — ACCU-CHEK AVIVA PLUS W/DEVICE KIT: 1.0000 | PACK | Freq: Once | 0 refills | Status: AC

## 2019-04-25 MED ORDER — REPAGLINIDE 0.5 MG PO TABS: 0.5000 mg | ORAL_TABLET | Freq: Three times a day (TID) | ORAL | 3 refills | Status: DC

## 2019-04-25 MED ORDER — REPAGLINIDE 0.5 MG PO TABS: 0.5000 mg | ORAL_TABLET | Freq: Three times a day (TID) | ORAL | 11 refills | Status: DC

## 2019-04-25 NOTE — Patient Instructions (Addendum)
I have sent a prescription to your pharmacy, to change glipizide to repaglinide.   check your blood sugar once a day.  vary the time of day when you check, between before the 3 meals, and at bedtime.  also check if you have symptoms of your blood sugar being too high or too low.  please keep a record of the readings and bring it to your next appointment here (or you can bring the meter itself).  You can write it on any piece of paper.  please call us sooner if your blood sugar goes below 70, or if you have a lot of readings over 200.  Please come back for a follow-up appointment in 2 months.

## 2019-04-25 NOTE — Progress Notes (Signed)
Subjective:    Patient ID: Carrie Singh, female    DOB: 1955-07-22, 64 y.o.   MRN: [id number]  HPI Pt returns for f/u of diabetes mellitus: DM type: 2 Dx'ed: 0000000 Complications: renal insuff.   Therapy: 2 oral meds GDM: never DKA: never Severe hypoglycemia: never Pancreatitis: never Pancreatic imaging: normal on 2010 [place].  SDOH: she cannot afford brand name meds.   Other: she has never been on insulin; renal insuff limits rx options Interval history: Pt says cbg varies from 110-130.  pt states she feels well in general.  For DM, she takes only glipizide 10 mg qd Past Medical History:  Diagnosis Date  . Breast cancer ([place])   . Breast cancer, right breast ([place]) 12/2007   a. s/p chemo, radiation, and lumpectomy with negative sentinel lymph node biopsy   . Depression   . Diabetes mellitus without complication ([place])   . GERD (gastroesophageal reflux disease)   . Hyperlipidemia   . Hypertension    resistant, negative renal Dopplers and negative CT angiogram  . Migraines   . Obesity   . Seizures ([place])    last seizure  1 week ago: states she jumps for a second and it's gone    Past Surgical History:  Procedure Laterality Date  . ABDOMINAL HYSTERECTOMY    . BREAST LUMPECTOMY    . CYST EXCISION    . FOOT SURGERY    . MASTECTOMY W/ SENTINEL NODE BIOPSY Right 03/18/2016   Procedure: RIGHT MASTECTOMY WITH RIGHT AXILLARY SENTINEL LYMPH NODE BIOPSY;  Surgeon: Alphonsa Overall, MD;  Location: [facility];  Service: General;  Laterality: Right;  . ovary tumor    . vocal cord nodules      Social History   Socioeconomic History  . Marital status: Married    Spouse name: [name]  . Number of children: Not on file  . Years of education: 12+  . Highest education level: Not on file  Occupational History  . Occupation: [employer]  Tobacco Use  . Smoking status: Never Smoker  . Smokeless tobacco: Never Used  Substance and Sexual Activity  . Alcohol use: No    Alcohol/week: 0.0 standard drinks  .  Drug use: No  . Sexual activity: Yes  Other Topics Concern  . Not on file  Social History Narrative   Lives at home with husband.   Caffeine use: 1-2 drinks per month         Epworth Sleepiness Scale = 15 (as of 01/01/15)   Social Determinants of Health   Financial Resource Strain:   . Difficulty of Paying Living Expenses:   Food Insecurity:   . Worried About Charity fundraiser in the Last Year:   . Arboriculturist in the Last Year:   Transportation Needs:   . Film/video editor (Medical):   Marland Kitchen Lack of Transportation (Non-Medical):   Physical Activity:   . Days of Exercise per Week:   . Minutes of Exercise per Session:   Stress:   . Feeling of Stress :   Social Connections:   . Frequency of Communication with Friends and Family:   . Frequency of Social Gatherings with Friends and Family:   . Attends Religious Services:   . Active Member of Clubs or Organizations:   . Attends Archivist Meetings:   Marland Kitchen Marital Status:   Intimate Partner Violence:   . Fear of Current or Ex-Partner:   . Emotionally Abused:   Marland Kitchen Physically Abused:   .  Sexually Abused:     Current Outpatient Medications on File Prior to Visit  Medication Sig Dispense Refill  . ACCU-CHEK AVIVA PLUS test strip USE 1 STRIP AS INSTRUCTED  TWICE DAILY 200 strip 3  . Accu-Chek Softclix Lancets lancets USE TWICE DAILY 200 each 3  . anastrozole (ARIMIDEX) 1 MG tablet TAKE 1 TABLET BY MOUTH  DAILY 90 tablet 3  . Biotin w/ Vitamins C & E (HAIR SKIN & NAILS GUMMIES PO) Take 1 tablet by mouth daily.     . CHROMIUM PICOLINATE PO Take by mouth.    . hydrALAZINE (APRESOLINE) 100 MG tablet TAKE 1 TABLET BY MOUTH 3  TIMES DAILY 45 tablet 0  . irbesartan (AVAPRO) 300 MG tablet Take 1 tablet (300 mg total) by mouth daily. 90 tablet 3  . Multiple Vitamins-Minerals (ONE-A-DAY WOMENS VITACRAVES) CHEW Chew 1 tablet by mouth daily.     . pravastatin (PRAVACHOL) 40 MG tablet TAKE 1 TABLET BY MOUTH  DAILY 90 tablet 2    . atenolol (TENORMIN) 25 MG tablet Take 1 tablet (25 mg total) by mouth 2 (two) times daily. 180 tablet 3   No current facility-administered medications on file prior to visit.    No Known Allergies  Family History  Problem Relation Age of Onset  . Stroke Mother   . Heart disease Father   . Asthma Father   . Cancer Father        colon  . Cancer Sister        lung  . Cancer Brother        colon  . Diabetes Brother   . Breast cancer Other 48    BP 132/88   Pulse (!) 104   Ht 5\' 5"  (1.651 m)   Wt 212 lb (96.2 kg)   SpO2 98%   BMI 35.28 kg/m    Review of Systems She denies hypoglycemia.      Objective:   Physical Exam VITAL SIGNS:  See vs page GENERAL: no distress Pulses: dorsalis pedis intact bilat.   MSK: no deformity of the feet CV: no leg edema Skin:  no ulcer on the feet.  normal color and temp on the feet. Neuro: sensation is intact to touch on the feet  Lab Results  Component Value Date   HGBA1C 6.2 (A) 04/25/2019   Lab Results  Component Value Date   CREATININE 1.24 (H) 02/08/2019   BUN 19 02/08/2019   NA 141 02/08/2019   K 4.0 02/08/2019   CL 104 02/08/2019   CO2 29 02/08/2019        Assessment & Plan:  Type 2 [id number] Renal failure: repaglinide is favored over SU Edema: improved, but hx of this limits rx options  Patient Instructions  I have sent a prescription to your pharmacy, to change glipizide to repaglinide.   check your blood sugar once a day.  vary the time of day when you check, between before the 3 meals, and at bedtime.  also check if you have symptoms of your blood sugar being too high or too low.  please keep a record of the readings and bring it to your next appointment here (or you can bring the meter itself).  You can write it on any piece of paper.  please call us sooner if your blood sugar goes below 70, or if you have a lot of readings over 200.  Please come back for a follow-up appointment in 2 months.

## 2019-04-30 DIAGNOSIS — D631 Anemia in chronic kidney disease: Secondary | ICD-10-CM | POA: Diagnosis not present

## 2019-04-30 DIAGNOSIS — I129 Hypertensive chronic kidney disease with stage 1 through stage 4 chronic kidney disease, or unspecified chronic kidney disease: Secondary | ICD-10-CM | POA: Diagnosis not present

## 2019-04-30 DIAGNOSIS — E1122 Type 2 diabetes mellitus with diabetic chronic kidney disease: Secondary | ICD-10-CM | POA: Diagnosis not present

## 2019-04-30 DIAGNOSIS — N281 Cyst of kidney, acquired: Secondary | ICD-10-CM | POA: Diagnosis not present

## 2019-04-30 DIAGNOSIS — N1831 Chronic kidney disease, stage 3a: Secondary | ICD-10-CM | POA: Diagnosis not present

## 2019-05-03 ENCOUNTER — Other Ambulatory Visit: Payer: Self-pay | Admitting: Internal Medicine

## 2019-05-03 ENCOUNTER — Encounter: Payer: Self-pay | Admitting: Internal Medicine

## 2019-05-03 MED ORDER — IRBESARTAN 300 MG PO TABS: 300.0000 mg | ORAL_TABLET | Freq: Every day | ORAL | 0 refills | Status: DC

## 2019-05-23 DIAGNOSIS — E1122 Type 2 diabetes mellitus with diabetic chronic kidney disease: Secondary | ICD-10-CM | POA: Diagnosis not present

## 2019-05-23 DIAGNOSIS — I129 Hypertensive chronic kidney disease with stage 1 through stage 4 chronic kidney disease, or unspecified chronic kidney disease: Secondary | ICD-10-CM | POA: Diagnosis not present

## 2019-05-23 DIAGNOSIS — N1831 Chronic kidney disease, stage 3a: Secondary | ICD-10-CM | POA: Diagnosis not present

## 2019-05-23 DIAGNOSIS — D631 Anemia in chronic kidney disease: Secondary | ICD-10-CM | POA: Diagnosis not present

## 2019-05-23 DIAGNOSIS — N281 Cyst of kidney, acquired: Secondary | ICD-10-CM | POA: Diagnosis not present

## 2019-05-25 ENCOUNTER — Other Ambulatory Visit: Payer: Self-pay | Admitting: Internal Medicine

## 2019-05-30 ENCOUNTER — Other Ambulatory Visit: Payer: Self-pay | Admitting: Internal Medicine

## 2019-05-30 ENCOUNTER — Other Ambulatory Visit: Payer: Self-pay

## 2019-05-30 ENCOUNTER — Ambulatory Visit (INDEPENDENT_AMBULATORY_CARE_PROVIDER_SITE_OTHER): Payer: Medicare Other | Admitting: Internal Medicine

## 2019-05-30 ENCOUNTER — Encounter: Payer: Self-pay | Admitting: Internal Medicine

## 2019-05-30 VITALS — BP 150/88 | HR 79 | Temp 97.6°F | Ht 65.0 in | Wt 216.0 lb

## 2019-05-30 DIAGNOSIS — F450 Somatization disorder: Secondary | ICD-10-CM

## 2019-05-30 DIAGNOSIS — F329 Major depressive disorder, single episode, unspecified: Secondary | ICD-10-CM

## 2019-05-30 DIAGNOSIS — E1165 Type 2 diabetes mellitus with hyperglycemia: Secondary | ICD-10-CM

## 2019-05-30 DIAGNOSIS — F32A Depression, unspecified: Secondary | ICD-10-CM

## 2019-05-30 DIAGNOSIS — I10 Essential (primary) hypertension: Secondary | ICD-10-CM | POA: Diagnosis not present

## 2019-05-30 DIAGNOSIS — Z170 Estrogen receptor positive status [ER+]: Secondary | ICD-10-CM

## 2019-05-30 DIAGNOSIS — K219 Gastro-esophageal reflux disease without esophagitis: Secondary | ICD-10-CM | POA: Diagnosis not present

## 2019-05-30 DIAGNOSIS — C50511 Malignant neoplasm of lower-outer quadrant of right female breast: Secondary | ICD-10-CM

## 2019-05-30 DIAGNOSIS — E782 Mixed hyperlipidemia: Secondary | ICD-10-CM | POA: Diagnosis not present

## 2019-05-30 DIAGNOSIS — N183 Chronic kidney disease, stage 3 unspecified: Secondary | ICD-10-CM | POA: Insufficient documentation

## 2019-05-30 DIAGNOSIS — G44011 Episodic cluster headache, intractable: Secondary | ICD-10-CM

## 2019-05-30 LAB — LIPID PANEL
Cholesterol: 160 mg/dL (ref 0–200)
HDL: 52.3 mg/dL (ref >39.00)
LDL Cholesterol: 84 mg/dL (ref 0–99)
NonHDL: 107.81
Total CHOL/HDL Ratio: 3
Triglycerides: 117 mg/dL (ref 0.0–149.0)
VLDL: 23.4 mg/dL (ref 0.0–40.0)

## 2019-05-30 LAB — COMPREHENSIVE METABOLIC PANEL
ALT: 14 U/L (ref 0–35)
AST: 15 U/L (ref 0–37)
Albumin: 4.1 g/dL (ref 3.5–5.2)
Alkaline Phosphatase: 78 U/L (ref 39–117)
BUN: 20 mg/dL (ref 6–23)
CO2: 31 mEq/L (ref 19–32)
Calcium: 9.5 mg/dL (ref 8.4–10.5)
Chloride: 104 mEq/L (ref 96–112)
Creatinine, Ser: 1.29 mg/dL — ABNORMAL HIGH (ref 0.40–1.20)
GFR: 50.02 mL/min — ABNORMAL LOW (ref >60.00)
Glucose, Bld: 202 mg/dL — ABNORMAL HIGH (ref 70–99)
Potassium: 4 mEq/L (ref 3.5–5.1)
Sodium: 138 mEq/L (ref 135–145)
Total Bilirubin: 1.1 mg/dL (ref 0.2–1.2)
Total Protein: 6.8 g/dL (ref 6.0–8.3)

## 2019-05-30 LAB — CBC
HCT: 37.2 % (ref 36.0–46.0)
Hemoglobin: 12.5 g/dL (ref 12.0–15.0)
MCHC: 33.6 g/dL (ref 30.0–36.0)
MCV: 87.5 fl (ref 78.0–100.0)
Platelets: 231 10*3/uL (ref 150.0–400.0)
RBC: 4.25 Mil/uL (ref 3.87–5.11)
RDW: 14 % (ref 11.5–15.5)
WBC: 4.1 10*3/uL (ref 4.0–10.5)

## 2019-05-30 NOTE — Assessment & Plan Note (Signed)
Slightly elevated today, repeat 140/80. Continue Carvedilol, Hydralazine and Irbesartan CMET today Reinforced DASH diet and exercise for weight loss

## 2019-05-30 NOTE — Assessment & Plan Note (Signed)
Stable off meds °Will monitor °

## 2019-05-30 NOTE — Progress Notes (Signed)
Subjective:    Patient ID: Carrie Singh, female    DOB: [birth year], 64 y.o.   MRN: [id number]  HPI  Patient presents to the clinic today for 42-month follow-up of chronic conditions.  Hx of Right Breast Cancer: s/p lumpectomy, mastectomy, chemo and radiation. In remission now. She is taking Arimedex as prescribed. She follows with Dr. Lindi Adie.  Depression: Chronic dysthymia, has been better lately. She is not seeing a therapist. She is not taking any medication for mood at this time. She denies anxiety, SI/HI.  DM2: Her last A1C was 6.2%, 04/2019. She is taking Repaglinide as prescribed. Her blood sugar this morning was 147. Her last eye exam was 10/2018. She checks her feet routinely. Flu, prevnar and covid vaccine UTD. Due for pneumovax 11/2019.  GERD: Intermittent. She is not sure what triggers this. She is not taking any medications for this. There is no upper GI on file.  HTN: Her BP today in 150/88. She is taking Carvedilol, Hydralazine and Irbesartan as prescribed. ECG from 01/2018 reviewed.  HLD: Her last LDL was 102, 11/2018. She denies myalgias on Pravastatin. She does consume some fried foods but has cut back.  Migraines: Currently not an issue. She is not taking any medication for this.  CKD 3: Creatinine 1.24, GFR 53.40. She is on Irbesartan for renal protection. She follows with Dr. Rolly Salter.  Review of Systems      Past Medical History:  Diagnosis Date  . Breast cancer ([place])   . Breast cancer, right breast ([place]) 12/2007   a. s/p chemo, radiation, and lumpectomy with negative sentinel lymph node biopsy   . Depression   . Diabetes mellitus without complication ([place])   . GERD (gastroesophageal reflux disease)   . Hyperlipidemia   . Hypertension    resistant, negative renal Dopplers and negative CT angiogram  . Migraines   . Obesity   . Seizures ([place])    last seizure  1 week ago: states she jumps for a second and it's gone    Current Outpatient Medications    Medication Sig Dispense Refill  . ACCU-CHEK AVIVA PLUS test strip USE 1 STRIP AS INSTRUCTED  TWICE DAILY 200 strip 3  . Accu-Chek Softclix Lancets lancets USE TWICE DAILY 200 each 3  . anastrozole (ARIMIDEX) 1 MG tablet TAKE 1 TABLET BY MOUTH  DAILY 90 tablet 3  . atenolol (TENORMIN) 25 MG tablet Take 1 tablet (25 mg total) by mouth 2 (two) times daily. 180 tablet 3  . Biotin w/ Vitamins C & E (HAIR SKIN & NAILS GUMMIES PO) Take 1 tablet by mouth daily.     . CHROMIUM PICOLINATE PO Take by mouth.    . hydrALAZINE (APRESOLINE) 100 MG tablet TAKE 1 TABLET BY MOUTH 3  TIMES DAILY 45 tablet 0  . irbesartan (AVAPRO) 300 MG tablet TAKE 1 TABLET BY MOUTH  DAILY 90 tablet 3  . Multiple Vitamins-Minerals (ONE-A-DAY WOMENS VITACRAVES) CHEW Chew 1 tablet by mouth daily.     . pravastatin (PRAVACHOL) 40 MG tablet TAKE 1 TABLET BY MOUTH  DAILY 90 tablet 2  . repaglinide (PRANDIN) 0.5 MG tablet Take 1 tablet (0.5 mg total) by mouth 3 (three) times daily before meals. 270 tablet 3   No current facility-administered medications for this visit.    No Known Allergies  Family History  Problem Relation Age of Onset  . Stroke Mother   . Heart disease Father   . Asthma Father   . Cancer Father  colon  . Cancer Sister        lung  . Cancer Brother        colon  . Diabetes Brother   . Breast cancer Other 51    Social History   Socioeconomic History  . Marital status: Married    Spouse name: [name]  . Number of children: Not on file  . Years of education: 12+  . Highest education level: Not on file  Occupational History  . Occupation: [employer]  Tobacco Use  . Smoking status: Never Smoker  . Smokeless tobacco: Never Used  Substance and Sexual Activity  . Alcohol use: No    Alcohol/week: 0.0 standard drinks  . Drug use: No  . Sexual activity: Yes  Other Topics Concern  . Not on file  Social History Narrative   Lives at home with husband.   Caffeine use: 1-2 drinks per month          Epworth Sleepiness Scale = 15 (as of 01/01/15)   Social Determinants of Health   Financial Resource Strain:   . Difficulty of Paying Living Expenses:   Food Insecurity:   . Worried About Charity fundraiser in the Last Year:   . Arboriculturist in the Last Year:   Transportation Needs:   . Film/video editor (Medical):   Marland Kitchen Lack of Transportation (Non-Medical):   Physical Activity:   . Days of Exercise per Week:   . Minutes of Exercise per Session:   Stress:   . Feeling of Stress :   Social Connections:   . Frequency of Communication with Friends and Family:   . Frequency of Social Gatherings with Friends and Family:   . Attends Religious Services:   . Active Member of Clubs or Organizations:   . Attends Archivist Meetings:   Marland Kitchen Marital Status:   Intimate Partner Violence:   . Fear of Current or Ex-Partner:   . Emotionally Abused:   Marland Kitchen Physically Abused:   . Sexually Abused:      Constitutional: Denies fever, malaise, fatigue, headache or abrupt weight changes.  HEENT: Denies eye pain, eye redness, ear pain, ringing in the ears, wax buildup, runny nose, nasal congestion, bloody nose, or sore throat. Respiratory: Denies difficulty breathing, shortness of breath, cough or sputum production.   Cardiovascular: Denies chest pain, chest tightness, palpitations or swelling in the hands or feet.  Gastrointestinal: Pt reports intermittent reflux. Denies abdominal pain, bloating, constipation, diarrhea or blood in the stool.  GU: Denies urgency, frequency, pain with urination, burning sensation, blood in urine, odor or discharge. Musculoskeletal: Denies decrease in range of motion, difficulty with gait, muscle pain or joint pain and swelling.  Skin: Pt reports a mass of her left ankle. Denies redness, rashes, lesions or ulcercations.  Neurological: Denies dizziness, difficulty with memory, difficulty with speech or problems with balance and coordination.  Psych: Pt has a  history of depression. Denies anxiety, SI/HI.  No other specific complaints in a complete review of systems (except as listed in HPI above).  Objective:   Physical Exam   BP (!) 150/88 (BP Location: Left Arm, Patient Position: Sitting, Cuff Size: Large)   Pulse 79   Temp 97.6 F (36.4 C) (Other (Comment))   Ht 5\' 5"  (1.651 m)   Wt 216 lb (98 kg)   SpO2 99%   BMI 35.94 kg/m   Wt Readings from Last 3 Encounters:  04/25/19 212 lb (96.2 kg)  02/08/19 224 lb  9.6 oz (101.9 kg)  12/06/18 225 lb (102.1 kg)    General: Appears her stated age, obese,  in NAD. Skin: Warm, dry and intact. No ulcerations noted. 1.5 x1.5in mass noted over left malleolus, c/w lipoma. Cardiovascular: Normal rate and rhythm. S1,S2 noted.  No murmur, rubs or gallops noted. No JVD or BLE edema. No carotid bruits noted. Pulmonary/Chest: Normal effort and positive vesicular breath sounds. No respiratory distress. No wheezes, rales or ronchi noted.  Abdomen: Soft and nontender. Normal bowel sounds. No distention or masses noted.  Neurological: Alert and oriented. Sensation intact to BLE. Psychiatric: Mood and affect normal. Behavior is normal. Judgment and thought content normal.     BMET    Component Value Date/Time   NA 141 02/08/2019 0752   NA 146 (H) 01/20/2015 1457   NA 143 10/12/2013 1510   K 4.0 02/08/2019 0752   K 3.9 10/12/2013 1510   CL 104 02/08/2019 0752   CL 109 (H) 10/12/2013 1510   CO2 29 02/08/2019 0752   CO2 29 10/12/2013 1510   GLUCOSE 114 (H) 02/08/2019 0752   GLUCOSE 92 10/12/2013 1510   BUN 19 02/08/2019 0752   BUN 17 01/20/2015 1457   BUN 16 10/12/2013 1510   CREATININE 1.24 (H) 02/08/2019 0752   CREATININE 1.15 (H) 10/12/2015 1603   CALCIUM 9.8 02/08/2019 0752   CALCIUM 9.4 10/12/2013 1510   GFRNONAA >60 01/24/2018 2159   GFRNONAA 56 (L) 10/12/2013 1510   GFRAA >60 01/24/2018 2159   GFRAA >60 10/12/2013 1510    Lipid Panel     Component Value Date/Time   CHOL 180  12/06/2018 0950   CHOL 179 10/17/2014 1527   TRIG 112.0 12/06/2018 0950   HDL 55.30 12/06/2018 0950   HDL 59 10/17/2014 1527   CHOLHDL 3 12/06/2018 0950   VLDL 22.4 12/06/2018 0950   LDLCALC 102 (H) 12/06/2018 0950   LDLCALC 91 10/17/2014 1527    CBC    Component Value Date/Time   WBC 4.6 12/06/2018 0950   RBC 4.53 12/06/2018 0950   HGB 13.2 12/06/2018 0950   HGB 13.5 10/12/2013 1510   HGB 12.8 08/13/2009 0946   HCT 39.5 12/06/2018 0950   HCT 40.5 10/12/2013 1510   HCT 36.8 08/13/2009 0946   PLT 258.0 12/06/2018 0950   PLT 278 10/12/2013 1510   PLT 271 08/13/2009 0946   MCV 87.0 12/06/2018 0950   MCV 87 10/12/2013 1510   MCV 87.0 08/13/2009 0946   MCH 28.7 01/24/2018 2159   MCHC 33.4 12/06/2018 0950   RDW 13.6 12/06/2018 0950   RDW 13.6 10/12/2013 1510   RDW 13.8 08/13/2009 0946   LYMPHSABS 2.1 01/24/2018 2159   LYMPHSABS 1.5 08/13/2009 0946   MONOABS 0.3 01/24/2018 2159   MONOABS 0.2 08/13/2009 0946   EOSABS 0.1 01/24/2018 2159   EOSABS 0.1 08/13/2009 0946   BASOSABS 0.1 01/24/2018 2159   BASOSABS 0.0 08/13/2009 0946    Hgb A1C Lab Results  Component Value Date   HGBA1C 6.2 (A) 04/25/2019           Assessment & Plan:   Lipoma of Left Ankle:  Offered ultrasound for further evaluation- she would like to hold off at this time  RTC in 6 months for annual exam  Webb Silversmith, NP This visit occurred during the SARS-CoV-2 public health emergency.  Safety protocols were in place, including screening questions prior to the visit, additional usage of staff PPE, and extensive cleaning of exam room while  observing appropriate contact time as indicated for disinfecting solutions.

## 2019-05-30 NOTE — Assessment & Plan Note (Signed)
Currently not an issue Will monitor 

## 2019-05-30 NOTE — Assessment & Plan Note (Signed)
In remission Continue Arimedex per oncology She will continue to follow with Dr. Lindi Adie

## 2019-05-30 NOTE — Patient Instructions (Signed)
Lipoma  A lipoma is a noncancerous (benign) tumor that is made up of fat cells. This is a very common type of soft-tissue growth. Lipomas are usually found under the skin (subcutaneous). They may occur in any tissue of the body that contains fat. Common areas for lipomas to appear include the back, arms, shoulders, buttocks, and thighs. Lipomas grow slowly, and they are usually painless. Most lipomas do not cause problems and do not require treatment. What are the causes? The cause of this condition is not known. What increases the risk? You are more likely to develop this condition if:  You are 40-60 years old.  You have a family history of lipomas. What are the signs or symptoms? A lipoma usually appears as a small, round bump under the skin. In most cases, the lump will:  Feel soft or rubbery.  Not cause pain or other symptoms. However, if a lipoma is located in an area where it pushes on nerves, it can become painful or cause other symptoms. How is this diagnosed? A lipoma can usually be diagnosed with a physical exam. You may also have tests to confirm the diagnosis and to rule out other conditions. Tests may include:  Imaging tests, such as a CT scan or an MRI.  Removal of a tissue sample to be looked at under a microscope (biopsy). How is this treated? Treatment for this condition depends on the size of the lipoma and whether it is causing any symptoms.  For small lipomas that are not causing problems, no treatment is needed.  If a lipoma is bigger or it causes problems, surgery may be done to remove the lipoma. Lipomas can also be removed to improve appearance. Most often, the procedure is done after applying a medicine that numbs the area (local anesthetic).  Liposuction may be done to reduce the size of the lipoma before it is removed through surgery, or it may be done to remove the lipoma. Lipomas are removed with this method in order to limit incision size and scarring. A  liposuction tube is inserted through a small incision into the lipoma, and the contents of the lipoma are removed through the tube with suction. Follow these instructions at home:  Watch your lipoma for any changes.  Keep all follow-up visits as told by your health care provider. This is important. Contact a health care provider if:  Your lipoma becomes larger or hard.  Your lipoma becomes painful, red, or increasingly swollen. These could be signs of infection or a more serious condition. Get help right away if:  You develop tingling or numbness in an area near the lipoma. This could indicate that the lipoma is causing nerve damage. Summary  A lipoma is a noncancerous tumor that is made up of fat cells.  Most lipomas do not cause problems and do not require treatment.  If a lipoma is bigger or it causes problems, surgery may be done to remove the lipoma.  Contact a health care provider if your lipoma becomes larger or hard, or if it becomes painful, red, or increasingly swollen. Pain, redness, and swelling could be signs of infection or a more serious condition. This information is not intended to replace advice given to you by your health care provider. Make sure you discuss any questions you have with your health care provider. Document Revised: 08/20/2018 Document Reviewed: 08/20/2018 Elsevier Patient Education  2020 [employer]  

## 2019-05-30 NOTE — Assessment & Plan Note (Signed)
Advised her she could take Tums/Rolaids OTC as needed

## 2019-05-30 NOTE — Assessment & Plan Note (Signed)
Continue Irbesartan Avoid NSAID's CMET today She will continue to follow with Dr. Rolly Salter

## 2019-05-30 NOTE — Assessment & Plan Note (Signed)
Too early to repeat A1C No microalbumin secondary to ARB therapy Continue Repaglinide Foot exam today Encouraged yearly eye exams Immunizations UTD, due for Pneumovax 11/2019 Encouraged her to consume a low carb diet and exercise for weight loss

## 2019-05-30 NOTE — Assessment & Plan Note (Signed)
CMET and Lipid profile today Encouraged her to consume a low fat diet. Continue Pravastatin for now

## 2019-06-13 ENCOUNTER — Other Ambulatory Visit: Payer: Self-pay | Admitting: Cardiovascular Disease

## 2019-06-13 NOTE — Telephone Encounter (Signed)
Called patient because atenolol was discontinued 5/13/2021and she stated no she is not taking medication and does not need refill. She asked that I refuse refill from [facility] and stated that she has called them herself several times to let them know she no longer takes atenolol. She thanked me for my call.

## 2019-06-26 NOTE — Progress Notes (Signed)
Patient Care Team: Jearld Fenton, NP as PCP - General (Internal Medicine) Minna Merritts, MD as Consulting Physician (Cardiology) Nicholas Lose, MD as Consulting Physician (Hematology and Oncology) Alphonsa Overall, MD as Consulting Physician (General Surgery) Delice Bison, Charlestine Massed, NP as Nurse Practitioner (Hematology and Oncology)  DIAGNOSIS:    ICD-10-CM   1. Malignant neoplasm of lower-outer quadrant of right breast of female, estrogen receptor positive ([place])  C50.511    Z17.0     SUMMARY OF ONCOLOGIC HISTORY: Oncology History  Malignant neoplasm of lower-outer quadrant of right breast of female, estrogen receptor positive ([place])  12/18/2007 Initial Diagnosis   Right breast cancer treated with lumpectomy followed by radiation and 5 years of antiestrogen therapy with aromatase inhibitor   02/02/2016 Relapse/Recurrence   Right breast biopsy 6:00 position: IDC with DCIS grade 2, ER 100%, PR 90%, Ki-67 10%, HER-2 negative ratio 1.28, screening detected right breast lumps 0.5 cm and 0.3 cm, T1a N0 stage IA clinical stage   03/18/2016 Surgery   Right simple mastectomy: Grade 2 IDC with DCIS 1.5 cm, margins -0/3 lymph nodes, T1b N0 stage IA, ER 100%, PR 90%, Ki-67 10%, HER-2 negative ratio 1.28   03/18/2016 Oncotype testing   11/7%   04/2016 -  Anti-estrogen oral therapy   Letrozole 2.33m daily switch to exemestane 25 mg daily May 2018 switched to anastrozole 1 mg daily due to cost     CHIEF COMPLIANT: Follow-up of right breast cancer onanastrozole therapy  INTERVAL HISTORY: LYalitza Singh a 64y.o. with above-mentioned history of right breast cancer treated with mastectomy followed by antiestrogen therapy with anastrozole. Mammogram on 09/25/18 showed no evidence of malignancy in the left breast. She presents to the clinic today for annual follow-up.  She denies any adverse effects to antiestrogen therapy.  Denies any hot flashes or arthralgias or myalgias.  She had lots of pain in  the right chest wall in the back especially after she was exercising with a rowing machine.  Since she stopped it the pain has resolved.  ALLERGIES:  has No Known Allergies.  MEDICATIONS:  Current Outpatient Medications  Medication Sig Dispense Refill  . ACCU-CHEK AVIVA PLUS test strip USE 1 STRIP AS INSTRUCTED  TWICE DAILY 200 strip 3  . Accu-Chek Softclix Lancets lancets USE TWICE DAILY 200 each 3  . anastrozole (ARIMIDEX) 1 MG tablet TAKE 1 TABLET BY MOUTH  DAILY 90 tablet 3  . Biotin w/ Vitamins C & E (HAIR SKIN & NAILS GUMMIES PO) Take 1 tablet by mouth daily.     . carvedilol (COREG) 12.5 MG tablet Take 12.5 mg by mouth in the morning and at bedtime.    . CHROMIUM PICOLINATE PO Take by mouth.    . hydrALAZINE (APRESOLINE) 100 MG tablet TAKE 1 TABLET BY MOUTH 3  TIMES DAILY 45 tablet 0  . irbesartan (AVAPRO) 300 MG tablet TAKE 1 TABLET BY MOUTH  DAILY 90 tablet 3  . Multiple Vitamins-Minerals (ONE-A-DAY WOMENS VITACRAVES) CHEW Chew 1 tablet by mouth daily.     . pravastatin (PRAVACHOL) 40 MG tablet TAKE 1 TABLET BY MOUTH  DAILY 90 tablet 2  . repaglinide (PRANDIN) 0.5 MG tablet Take 1 tablet (0.5 mg total) by mouth 3 (three) times daily before meals. 270 tablet 3   No current facility-administered medications for this visit.    PHYSICAL EXAMINATION: ECOG PERFORMANCE STATUS: 1 - Symptomatic but completely ambulatory  There were no vitals filed for this visit. There were no vitals filed  for this visit.  BREAST: No palpable lumps or nodules in the right chest wall where she had mastectomy or her left breast. No palpable axillary supraclavicular or infraclavicular adenopathy no breast tenderness or nipple discharge. (exam performed in the presence of a chaperone)  LABORATORY DATA:  I have reviewed the data as listed CMP Latest Ref Rng & Units 05/30/2019 02/08/2019 12/31/2018  Glucose 70 - 99 mg/dL 202(H) 114(H) 263(H)  BUN 6 - 23 mg/dL _0 Creatinine 0.40 - 1.20 mg/dL 1.29(H)  1.24(H) 1.30(H)  Sodium 135 - 145 mEq/L 138 141 141  Potassium 3.5 - 5.1 mEq/L 4.0 4.0 4.1  Chloride 96 - 112 mEq/L 104 104 105  CO2 19 - 32 mEq/L _1 Calcium 8.4 - 10.5 mg/dL 9.5 9.8 9.8  Total Protein 6.0 - 8.3 g/dL 6.8 - -  Total Bilirubin 0.2 - 1.2 mg/dL 1.1 - -  Alkaline Phos 39 - 117 U/L 78 - -  AST 0 - 37 U/L 15 - -  ALT 0 - 35 U/L 14 - -    Lab Results  Component Value Date   WBC 4.1 05/30/2019   HGB 12.5 05/30/2019   HCT 37.2 05/30/2019   MCV 87.5 05/30/2019   PLT 231.0 05/30/2019   NEUTROABS 2.7 01/24/2018    ASSESSMENT & PLAN:  Malignant neoplasm of lower-outer quadrant of right breast of female, estrogen receptor positive ([facility]) Right simple mastectomy: Grade 2 IDC with DCIS 1.5 cm, margins -0/3 lymph nodes, T1b N0 stage IA, ER 100%, PR 90%, Ki-67 10%, HER-2 negative ratio 1.28 Oncotype DX score 11:7% risk of recurrence  Current treatment: Letrozole 2.5 mg daily started 04/2016 switched to exemestane 05/10/2018switched to anastrozole  Breast Cancer Surveillance: 1. Breast exam6/10/2019:No palpable lumps or nodules of concern 2. Mammogram left breast: 09/25/2018: Benign, breast density category B 3. bone density: 08/23/2017: T score -0.1: Normal.   CT chest and MRI brain 05/31/2017: Negative for metastatic disease X-ray lumbar spine 04/23/2018: Degenerative joint disease L4-L5  Anastrozoletoxicities: Muscle and joint painsare tolerable  Return to clinic in 1 year for follow-up    No orders of the defined types were placed in this encounter.  The patient has a good understanding of the overall plan. she agrees with it. she will call with any problems that may develop before the next visit here.  Total time spent: 20 mins including face to face time and time spent for planning, charting and coordination of care  Nicholas Lose, MD 06/27/2019  I, Cloyde Reams Dorshimer, am acting as scribe for Dr. Nicholas Lose.  I have reviewed the above documentation  for accuracy and completeness, and I agree with the above.

## 2019-06-27 ENCOUNTER — Inpatient Hospital Stay: Payer: Medicare Other | Attending: Hematology and Oncology | Admitting: Hematology and Oncology

## 2019-06-27 ENCOUNTER — Other Ambulatory Visit: Payer: Self-pay

## 2019-06-27 DIAGNOSIS — Z79811 Long term (current) use of aromatase inhibitors: Secondary | ICD-10-CM | POA: Diagnosis not present

## 2019-06-27 DIAGNOSIS — Z170 Estrogen receptor positive status [ER+]: Secondary | ICD-10-CM

## 2019-06-27 DIAGNOSIS — C50511 Malignant neoplasm of lower-outer quadrant of right female breast: Secondary | ICD-10-CM | POA: Diagnosis not present

## 2019-06-27 DIAGNOSIS — M1990 Unspecified osteoarthritis, unspecified site: Secondary | ICD-10-CM | POA: Insufficient documentation

## 2019-06-27 DIAGNOSIS — Z9011 Acquired absence of right breast and nipple: Secondary | ICD-10-CM | POA: Insufficient documentation

## 2019-06-27 DIAGNOSIS — Z79899 Other long term (current) drug therapy: Secondary | ICD-10-CM | POA: Diagnosis not present

## 2019-06-27 MED ORDER — ANASTROZOLE 1 MG PO TABS: 1.0000 mg | ORAL_TABLET | Freq: Every day | ORAL | 3 refills | Status: DC

## 2019-06-27 NOTE — Assessment & Plan Note (Signed)
Right simple mastectomy: Grade 2 IDC with DCIS 1.5 cm, margins -0/3 lymph nodes, T1b N0 stage IA, ER 100%, PR 90%, Ki-67 10%, HER-2 negative ratio 1.28 Oncotype DX score 11:7% risk of recurrence  Current treatment: Letrozole 2.5 mg daily started 04/2016 switched to exemestane 05/10/2018switched to anastrozole  Breast Cancer Surveillance: 1. Breast exam6/10/2019:No palpable lumps or nodules of concern 2. Mammogram left breast: 09/25/2018: Benign, breast density category B 3. bone density: 08/23/2017: T score -0.1: Normal.   CT chest and MRI brain 05/31/2017: Negative for metastatic disease X-ray lumbar spine 04/23/2018: Degenerative joint disease L4-L5  Anastrozoletoxicities: Muscle and joint painsare tolerable  Return to clinic in 1 year for follow-up

## 2019-07-02 ENCOUNTER — Telehealth: Payer: Self-pay | Admitting: Hematology and Oncology

## 2019-07-02 NOTE — Telephone Encounter (Signed)
Scheduled per 06/10 los, patient has been called and notified.

## 2019-08-13 ENCOUNTER — Other Ambulatory Visit: Payer: Self-pay | Admitting: Adult Health

## 2019-08-13 DIAGNOSIS — Z1231 Encounter for screening mammogram for malignant neoplasm of breast: Secondary | ICD-10-CM

## 2019-09-09 ENCOUNTER — Other Ambulatory Visit: Payer: Self-pay

## 2019-09-09 ENCOUNTER — Ambulatory Visit: Payer: Medicare Other | Admitting: Endocrinology

## 2019-09-09 ENCOUNTER — Encounter: Payer: Self-pay | Admitting: Endocrinology

## 2019-09-09 VITALS — BP 124/80 | HR 87 | Ht 65.0 in | Wt 216.6 lb

## 2019-09-09 DIAGNOSIS — E119 Type 2 diabetes mellitus without complications: Secondary | ICD-10-CM

## 2019-09-09 LAB — POCT GLYCOSYLATED HEMOGLOBIN (HGB A1C): Hemoglobin A1C: 6.6 % — AB (ref 4.0–5.6)

## 2019-09-09 NOTE — Progress Notes (Signed)
Subjective:    Patient ID: Carrie Singh, female    DOB: [birth year], 64 y.o.   MRN: [id number]  HPI Pt returns for f/u of diabetes mellitus: DM type: 2 Dx'ed: 0177 Complications: stage 3 CRI Therapy: 2 oral meds GDM: never DKA: never Severe hypoglycemia: never Pancreatitis: never Pancreatic imaging: normal on 2010 [place].  SDOH: she cannot afford brand name meds.   Other: she has never been on insulin; edema and renal insuff limit rx options; ins declined rybelsus.   Interval history: Pt says cbg varies from 111-283.  pt states she feels well in general.  She take repaglinide as rx'ed Past Medical History:  Diagnosis Date  . Breast cancer ([place])   . Breast cancer, right breast ([place]) 12/2007   a. s/p chemo, radiation, and lumpectomy with negative sentinel lymph node biopsy   . Depression   . Diabetes mellitus without complication ([place])   . GERD (gastroesophageal reflux disease)   . Hyperlipidemia   . Hypertension    resistant, negative renal Dopplers and negative CT angiogram  . Migraines   . Obesity   . Seizures ([place])    last seizure  1 week ago: states she jumps for a second and it's gone    Past Surgical History:  Procedure Laterality Date  . ABDOMINAL HYSTERECTOMY    . BREAST LUMPECTOMY    . CYST EXCISION    . FOOT SURGERY    . MASTECTOMY W/ SENTINEL NODE BIOPSY Right 03/18/2016   Procedure: RIGHT MASTECTOMY WITH RIGHT AXILLARY SENTINEL LYMPH NODE BIOPSY;  Surgeon: Alphonsa Overall, MD;  Location: [facility];  Service: General;  Laterality: Right;  . ovary tumor    . vocal cord nodules      Social History   Socioeconomic History  . Marital status: Married    Spouse name: [name]  . Number of children: Not on file  . Years of education: 12+  . Highest education level: Not on file  Occupational History  . Occupation: [employer]  Tobacco Use  . Smoking status: Never Smoker  . Smokeless tobacco: Never Used  Vaping Use  . Vaping Use: Never used  Substance and Sexual Activity    . Alcohol use: No    Alcohol/week: 0.0 standard drinks  . Drug use: No  . Sexual activity: Yes  Other Topics Concern  . Not on file  Social History Narrative   Lives at home with husband.   Caffeine use: 1-2 drinks per month         Epworth Sleepiness Scale = 15 (as of 01/01/15)   Social Determinants of Health   Financial Resource Strain:   . Difficulty of Paying Living Expenses: Not on file  Food Insecurity:   . Worried About Charity fundraiser in the Last Year: Not on file  . Ran Out of Food in the Last Year: Not on file  Transportation Needs:   . Lack of Transportation (Medical): Not on file  . Lack of Transportation (Non-Medical): Not on file  Physical Activity:   . Days of Exercise per Week: Not on file  . Minutes of Exercise per Session: Not on file  Stress:   . Feeling of Stress : Not on file  Social Connections:   . Frequency of Communication with Friends and Family: Not on file  . Frequency of Social Gatherings with Friends and Family: Not on file  . Attends Religious Services: Not on file  . Active Member of Clubs or Organizations: Not on file  .  Attends Archivist Meetings: Not on file  . Marital Status: Not on file  Intimate Partner Violence:   . Fear of Current or Ex-Partner: Not on file  . Emotionally Abused: Not on file  . Physically Abused: Not on file  . Sexually Abused: Not on file    Current Outpatient Medications on File Prior to Visit  Medication Sig Dispense Refill  . ACCU-CHEK AVIVA PLUS test strip USE 1 STRIP AS INSTRUCTED  TWICE DAILY (Patient taking differently: 1 each by Other route 2 (two) times daily. E11.9) 200 strip 3  . Accu-Chek Softclix Lancets lancets USE TWICE DAILY (Patient taking differently: 1 each by Other route 2 (two) times daily. E11.9) 200 each 3  . anastrozole (ARIMIDEX) 1 MG tablet Take 1 tablet (1 mg total) by mouth daily. 90 tablet 3  . Biotin w/ Vitamins C & E (HAIR SKIN & NAILS GUMMIES PO) Take 1 tablet by  mouth daily.     . carvedilol (COREG) 12.5 MG tablet Take 12.5 mg by mouth in the morning and at bedtime.    . CHROMIUM PICOLINATE PO Take by mouth.    . hydrALAZINE (APRESOLINE) 100 MG tablet TAKE 1 TABLET BY MOUTH 3  TIMES DAILY 45 tablet 0  . irbesartan (AVAPRO) 300 MG tablet TAKE 1 TABLET BY MOUTH  DAILY 90 tablet 3  . Multiple Vitamins-Minerals (ONE-A-DAY WOMENS VITACRAVES) CHEW Chew 1 tablet by mouth daily.     . pravastatin (PRAVACHOL) 40 MG tablet TAKE 1 TABLET BY MOUTH  DAILY 90 tablet 2  . repaglinide (PRANDIN) 0.5 MG tablet Take 1 tablet (0.5 mg total) by mouth 3 (three) times daily before meals. 270 tablet 3   No current facility-administered medications on file prior to visit.    No Known Allergies  Family History  Problem Relation Age of Onset  . Stroke Mother   . Heart disease Father   . Asthma Father   . Cancer Father        colon  . Cancer Sister        lung  . Cancer Brother        colon  . Diabetes Brother   . Breast cancer Other 48    BP 124/80   Pulse 87   Ht 5\' 5"  (1.651 m)   Wt 216 lb 9.6 oz (98.2 kg)   SpO2 96%   BMI 36.04 kg/m    Review of Systems She denies hypoglycemia    Objective:   Physical Exam VITAL SIGNS:  See vs page GENERAL: no distress Pulses: dorsalis pedis intact bilat.   MSK: no deformity of the feet CV: trace bilat leg edema Skin:  no ulcer on the feet.  normal color and temp on the feet. Neuro: sensation is intact to touch on the feet  A1c=6.6%  Lab Results  Component Value Date   CREATININE 1.29 (H) 05/30/2019   BUN 20 05/30/2019   NA 138 05/30/2019   K 4.0 05/30/2019   CL 104 05/30/2019   CO2 31 05/30/2019       Assessment & Plan:  Type 2 DM, with stage 3 CRI: well-controlled  Patient Instructions  Please continue the same repaglinide.   check your blood sugar once a day.  vary the time of day when you check, between before the 3 meals, and at bedtime.  also check if you have symptoms of your blood sugar  being too high or too low.  please keep a record of the readings  and bring it to your next appointment here (or you can bring the meter itself).  You can write it on any piece of paper.  please call us sooner if your blood sugar goes below 70, or if you have a lot of readings over 200.  Please come back for a follow-up appointment in 3-4 months.

## 2019-09-09 NOTE — Patient Instructions (Addendum)
Please continue the same repaglinide.   check your blood sugar once a day.  vary the time of day when you check, between before the 3 meals, and at bedtime.  also check if you have symptoms of your blood sugar being too high or too low.  please keep a record of the readings and bring it to your next appointment here (or you can bring the meter itself).  You can write it on any piece of paper.  please call us sooner if your blood sugar goes below 70, or if you have a lot of readings over 200.  Please come back for a follow-up appointment in 3-4 months.

## 2019-09-26 ENCOUNTER — Ambulatory Visit
Admission: RE | Admit: 2019-09-26 | Discharge: 2019-09-26 | Disposition: A | Discharge status: Home / Self Care | Payer: Medicare Other | Source: Ambulatory Visit | Attending: Adult Health | Admitting: Adult Health

## 2019-09-26 ENCOUNTER — Other Ambulatory Visit: Payer: Self-pay

## 2019-09-26 DIAGNOSIS — Z1231 Encounter for screening mammogram for malignant neoplasm of breast: Secondary | ICD-10-CM

## 2019-10-14 ENCOUNTER — Telehealth: Payer: Self-pay

## 2019-10-14 NOTE — Telephone Encounter (Signed)
Request from [facility] for supplies.  Order faxed.  Patient authorized.

## 2019-10-28 ENCOUNTER — Other Ambulatory Visit: Payer: Self-pay | Admitting: Internal Medicine

## 2019-10-29 NOTE — Telephone Encounter (Signed)
[facility] supply calling back stating Dr Loanne Drilling changed the prescription and they needed his initials by the changed portion along with the date.  Fax# [phone]

## 2019-10-29 NOTE — Telephone Encounter (Signed)
Hey do you know where this paperwork is for this patient.

## 2019-10-30 NOTE — Telephone Encounter (Signed)
Form re-faxed

## 2019-11-01 ENCOUNTER — Encounter: Payer: Self-pay | Admitting: Internal Medicine

## 2019-11-04 ENCOUNTER — Other Ambulatory Visit: Payer: Self-pay | Admitting: Cardiovascular Disease

## 2019-11-08 ENCOUNTER — Other Ambulatory Visit: Payer: Self-pay

## 2019-11-08 MED ORDER — HYDRALAZINE HCL 100 MG PO TABS: 100.0000 mg | ORAL_TABLET | Freq: Three times a day (TID) | ORAL | 0 refills | Status: DC

## 2019-11-12 DIAGNOSIS — C50911 Malignant neoplasm of unspecified site of right female breast: Secondary | ICD-10-CM | POA: Diagnosis not present

## 2019-11-14 DIAGNOSIS — E119 Type 2 diabetes mellitus without complications: Secondary | ICD-10-CM | POA: Diagnosis not present

## 2019-11-18 DIAGNOSIS — N281 Cyst of kidney, acquired: Secondary | ICD-10-CM | POA: Diagnosis not present

## 2019-11-18 DIAGNOSIS — N1831 Chronic kidney disease, stage 3a: Secondary | ICD-10-CM | POA: Diagnosis not present

## 2019-11-18 DIAGNOSIS — D631 Anemia in chronic kidney disease: Secondary | ICD-10-CM | POA: Diagnosis not present

## 2019-11-18 DIAGNOSIS — I129 Hypertensive chronic kidney disease with stage 1 through stage 4 chronic kidney disease, or unspecified chronic kidney disease: Secondary | ICD-10-CM | POA: Diagnosis not present

## 2019-11-18 DIAGNOSIS — E1122 Type 2 diabetes mellitus with diabetic chronic kidney disease: Secondary | ICD-10-CM | POA: Diagnosis not present

## 2019-11-21 ENCOUNTER — Encounter: Payer: Self-pay | Admitting: Podiatry

## 2019-11-26 ENCOUNTER — Encounter: Payer: Self-pay | Admitting: Internal Medicine

## 2019-11-26 DIAGNOSIS — E1122 Type 2 diabetes mellitus with diabetic chronic kidney disease: Secondary | ICD-10-CM | POA: Diagnosis not present

## 2019-11-26 DIAGNOSIS — D631 Anemia in chronic kidney disease: Secondary | ICD-10-CM | POA: Diagnosis not present

## 2019-11-26 DIAGNOSIS — N1831 Chronic kidney disease, stage 3a: Secondary | ICD-10-CM | POA: Diagnosis not present

## 2019-11-26 DIAGNOSIS — I129 Hypertensive chronic kidney disease with stage 1 through stage 4 chronic kidney disease, or unspecified chronic kidney disease: Secondary | ICD-10-CM | POA: Diagnosis not present

## 2019-12-02 ENCOUNTER — Ambulatory Visit (INDEPENDENT_AMBULATORY_CARE_PROVIDER_SITE_OTHER): Payer: Medicare Other | Admitting: Podiatry

## 2019-12-02 ENCOUNTER — Encounter: Payer: Self-pay | Admitting: Podiatry

## 2019-12-02 ENCOUNTER — Ambulatory Visit (INDEPENDENT_AMBULATORY_CARE_PROVIDER_SITE_OTHER): Payer: Medicare Other

## 2019-12-02 ENCOUNTER — Other Ambulatory Visit: Payer: Self-pay

## 2019-12-02 ENCOUNTER — Encounter: Payer: Self-pay | Admitting: Endocrinology

## 2019-12-02 DIAGNOSIS — M79672 Pain in left foot: Secondary | ICD-10-CM

## 2019-12-02 DIAGNOSIS — M7751 Other enthesopathy of right foot: Secondary | ICD-10-CM | POA: Diagnosis not present

## 2019-12-02 DIAGNOSIS — M779 Enthesopathy, unspecified: Secondary | ICD-10-CM | POA: Diagnosis not present

## 2019-12-02 DIAGNOSIS — M79671 Pain in right foot: Secondary | ICD-10-CM

## 2019-12-02 NOTE — Progress Notes (Signed)
Subjective:   Patient ID: Carrie Singh, female   DOB: 64 y.o.   MRN: [id number]   HPI Patient presents stating she is developed a lot of pain in the ankles of both feet and is concerned about walking comfortably   ROS      Objective:  Physical Exam  Neurovascular status intact with exquisite discomfort in the sinus tarsi capsule bilateral with pain     Assessment:  Sinus tarsitis bilateral capsulitis     Plan:  H&P reviewed condition sterile preps done injected the sinus tarsi bilateral 3 mg Kenalog 5 mg Xylocaine advised on physical therapy reappoint to recheck  X-rays indicate no signs of fracture no signs of bone pathology with condition

## 2019-12-03 ENCOUNTER — Other Ambulatory Visit: Payer: Self-pay | Admitting: Podiatry

## 2019-12-03 DIAGNOSIS — M779 Enthesopathy, unspecified: Secondary | ICD-10-CM

## 2019-12-10 NOTE — Telephone Encounter (Signed)
LMTCB and schedule as per Dr Cordelia Pen request

## 2019-12-10 NOTE — Telephone Encounter (Signed)
Pt returned call for scheduling with Dr. Loanne Drilling. Pt states her kidney specialist has referred her to an endocrinologist in their group & pt states she is scheduled in January. Pt states she will call back to schedule with Loanne Drilling if she decides not to go to the other doctor.

## 2019-12-18 DIAGNOSIS — E1122 Type 2 diabetes mellitus with diabetic chronic kidney disease: Secondary | ICD-10-CM | POA: Diagnosis not present

## 2019-12-18 DIAGNOSIS — N1831 Chronic kidney disease, stage 3a: Secondary | ICD-10-CM | POA: Diagnosis not present

## 2020-01-09 ENCOUNTER — Ambulatory Visit (INDEPENDENT_AMBULATORY_CARE_PROVIDER_SITE_OTHER): Payer: Medicare Other | Admitting: Internal Medicine

## 2020-01-09 ENCOUNTER — Other Ambulatory Visit: Payer: Self-pay

## 2020-01-09 ENCOUNTER — Encounter: Payer: Self-pay | Admitting: Internal Medicine

## 2020-01-09 VITALS — BP 128/88 | HR 77 | Temp 97.8°F | Ht 65.0 in | Wt 213.0 lb

## 2020-01-09 DIAGNOSIS — Z170 Estrogen receptor positive status [ER+]: Secondary | ICD-10-CM

## 2020-01-09 DIAGNOSIS — I10 Essential (primary) hypertension: Secondary | ICD-10-CM

## 2020-01-09 DIAGNOSIS — F450 Somatization disorder: Secondary | ICD-10-CM

## 2020-01-09 DIAGNOSIS — E1165 Type 2 diabetes mellitus with hyperglycemia: Secondary | ICD-10-CM | POA: Diagnosis not present

## 2020-01-09 DIAGNOSIS — Z0000 Encounter for general adult medical examination without abnormal findings: Secondary | ICD-10-CM | POA: Diagnosis not present

## 2020-01-09 DIAGNOSIS — K219 Gastro-esophageal reflux disease without esophagitis: Secondary | ICD-10-CM

## 2020-01-09 DIAGNOSIS — N1832 Chronic kidney disease, stage 3b: Secondary | ICD-10-CM

## 2020-01-09 DIAGNOSIS — F329 Major depressive disorder, single episode, unspecified: Secondary | ICD-10-CM

## 2020-01-09 DIAGNOSIS — R5383 Other fatigue: Secondary | ICD-10-CM | POA: Diagnosis not present

## 2020-01-09 DIAGNOSIS — E782 Mixed hyperlipidemia: Secondary | ICD-10-CM

## 2020-01-09 DIAGNOSIS — Z23 Encounter for immunization: Secondary | ICD-10-CM | POA: Diagnosis not present

## 2020-01-09 DIAGNOSIS — G44011 Episodic cluster headache, intractable: Secondary | ICD-10-CM

## 2020-01-09 DIAGNOSIS — C50511 Malignant neoplasm of lower-outer quadrant of right female breast: Secondary | ICD-10-CM

## 2020-01-09 LAB — LIPID PANEL
Cholesterol: 144 mg/dL (ref 0–200)
HDL: 61.5 mg/dL (ref >39.00)
LDL Cholesterol: 68 mg/dL (ref 0–99)
NonHDL: 82.7
Total CHOL/HDL Ratio: 2
Triglycerides: 76 mg/dL (ref 0.0–149.0)
VLDL: 15.2 mg/dL (ref 0.0–40.0)

## 2020-01-09 LAB — COMPREHENSIVE METABOLIC PANEL
ALT: 14 U/L (ref 0–35)
AST: 14 U/L (ref 0–37)
Albumin: 4.3 g/dL (ref 3.5–5.2)
Alkaline Phosphatase: 68 U/L (ref 39–117)
BUN: 17 mg/dL (ref 6–23)
CO2: 31 mEq/L (ref 19–32)
Calcium: 10.1 mg/dL (ref 8.4–10.5)
Chloride: 104 mEq/L (ref 96–112)
Creatinine, Ser: 1.3 mg/dL — ABNORMAL HIGH (ref 0.40–1.20)
GFR: 42.8 mL/min — ABNORMAL LOW (ref >60.00)
Glucose, Bld: 131 mg/dL — ABNORMAL HIGH (ref 70–99)
Potassium: 4.4 mEq/L (ref 3.5–5.1)
Sodium: 140 mEq/L (ref 135–145)
Total Bilirubin: 1.2 mg/dL (ref 0.2–1.2)
Total Protein: 7.2 g/dL (ref 6.0–8.3)

## 2020-01-09 LAB — CBC
HCT: 39.5 % (ref 36.0–46.0)
Hemoglobin: 13.1 g/dL (ref 12.0–15.0)
MCHC: 33.2 g/dL (ref 30.0–36.0)
MCV: 86.9 fl (ref 78.0–100.0)
Platelets: 243 10*3/uL (ref 150.0–400.0)
RBC: 4.54 Mil/uL (ref 3.87–5.11)
RDW: 13.8 % (ref 11.5–15.5)
WBC: 3.8 10*3/uL — ABNORMAL LOW (ref 4.0–10.5)

## 2020-01-09 LAB — VITAMIN B12: Vitamin B-12: 525 pg/mL (ref 211–911)

## 2020-01-09 LAB — VITAMIN D 25 HYDROXY (VIT D DEFICIENCY, FRACTURES): VITD: 48.2 ng/mL (ref 30.00–100.00)

## 2020-01-09 LAB — HEMOGLOBIN A1C: Hgb A1c MFr Bld: 6.8 % — ABNORMAL HIGH (ref 4.6–6.5)

## 2020-01-09 MED ORDER — HYDRALAZINE HCL 100 MG PO TABS: 100.0000 mg | ORAL_TABLET | Freq: Three times a day (TID) | ORAL | 3 refills | Status: DC

## 2020-01-09 NOTE — Assessment & Plan Note (Signed)
Stable off meds Support offered 

## 2020-01-09 NOTE — Patient Instructions (Signed)

## 2020-01-09 NOTE — Assessment & Plan Note (Signed)
C met, lipid profile and A1c today No urine microalbumin secondary to ARB therapy Encouraged her to consume a low carb diet, exercise for weight loss Encourage routine eye exams Encourage routine foot exams, follows with podiatry Flu, Prevnar and Covid UTD Pneumovax today She will continue to follow with endocrinology

## 2020-01-09 NOTE — Assessment & Plan Note (Signed)
C met and lipid profile today Encouraged her to consume a low-fat diet Continue Pravastatin  

## 2020-01-09 NOTE — Assessment & Plan Note (Signed)
Continue Hydralazine, HCTZ, Carvedilol and Irbesartan Reinforced DASH diet and exercise for weight loss C met today We will monitor

## 2020-01-09 NOTE — Assessment & Plan Note (Signed)
Currently not an issue off meds 

## 2020-01-09 NOTE — Progress Notes (Signed)
HPI:  Pt presents to the clinic today for her subsequent annual Medicare Wellness Exam. She is also due to follow up chronic conditoins.  Hx of Right Breast Cancer: Status post lumpectomy with with subsequent mastectomy, chemo and radiation.  She is taking Anastrozole as prescribed.  She follows with the cancer center.  Depression: Chronic dysthymia.  She is not taking any medications for this.  She is not currently seeing a therapist.  She denies anxiety, SI/HI.  DM2: Her last A1c was 6.6, 08/2019.  Her sugars range 99-149.  She is taking Metformin and Farxiga as prescribed.  She follows with podiatry.  Her last eye exam was 11/2019. She follows with Dr. Gabriel Carina.  GERD: She is not sure what triggers this.  She is not taking any medications for this.  There is no upper GI on file.  HTN: Her BP today is 122/80.  She is taking Hydralazine, HCTZ, Carvedilol and Irbesartan as prescribed.  ECG from 02/05/2018 reviewed.  HLD: Her last LDL was 84, 08/2019.  She denies myalgias on Pravastatin.  She tries to consume a low-fat diet.  Migraines: No recent migraines.  Managed with Goody powders OTC with good relief of symptoms.  She does not follow with neurology.  CKD 3: Her last creatinine was 1.29, GFR 50. She follows with nephrology.  Past Medical History:  Diagnosis Date  . Breast cancer ([place])   . Breast cancer, right breast ([place]) 12/2007   a. s/p chemo, radiation, and lumpectomy with negative sentinel lymph node biopsy   . Depression   . Diabetes mellitus without complication ([place])   . GERD (gastroesophageal reflux disease)   . Hyperlipidemia   . Hypertension    resistant, negative renal Dopplers and negative CT angiogram  . Migraines   . Obesity   . Seizures ([place])    last seizure  1 week ago: states she jumps for a second and it's gone    Current Outpatient Medications  Medication Sig Dispense Refill  . ACCU-CHEK AVIVA PLUS test strip USE 1 STRIP AS INSTRUCTED  TWICE DAILY (Patient  taking differently: 1 each by Other route 2 (two) times daily. E11.9) 200 strip 3  . Accu-Chek Softclix Lancets lancets USE TWICE DAILY (Patient taking differently: 1 each by Other route 2 (two) times daily. E11.9) 200 each 3  . anastrozole (ARIMIDEX) 1 MG tablet Take 1 tablet (1 mg total) by mouth daily. 90 tablet 3  . Biotin w/ Vitamins C & E (HAIR SKIN & NAILS GUMMIES PO) Take 1 tablet by mouth daily.     . carvedilol (COREG) 12.5 MG tablet Take 12.5 mg by mouth in the morning and at bedtime.    . CHROMIUM PICOLINATE PO Take by mouth.    . hydrALAZINE (APRESOLINE) 100 MG tablet Take 1 tablet (100 mg total) by mouth 3 (three) times daily. 15 tablet 0  . irbesartan (AVAPRO) 300 MG tablet TAKE 1 TABLET BY MOUTH  DAILY 90 tablet 3  . Multiple Vitamins-Minerals (ONE-A-DAY WOMENS VITACRAVES) CHEW Chew 1 tablet by mouth daily.     . pravastatin (PRAVACHOL) 40 MG tablet TAKE 1 TABLET BY MOUTH  DAILY 90 tablet 0  . repaglinide (PRANDIN) 0.5 MG tablet Take 1 tablet (0.5 mg total) by mouth 3 (three) times daily before meals. 270 tablet 3   No current facility-administered medications for this visit.    No Known Allergies  Family History  Problem Relation Age of Onset  . Stroke Mother   . Heart disease  Father   . Asthma Father   . Cancer Father        colon  . Cancer Sister        lung  . Cancer Brother        colon  . Diabetes Brother   . Breast cancer Other 52    Social History   Socioeconomic History  . Marital status: Married    Spouse name: [name]  . Number of children: Not on file  . Years of education: 12+  . Highest education level: Not on file  Occupational History  . Occupation: [employer]  Tobacco Use  . Smoking status: Never Smoker  . Smokeless tobacco: Never Used  Vaping Use  . Vaping Use: Never used  Substance and Sexual Activity  . Alcohol use: No    Alcohol/week: 0.0 standard drinks  . Drug use: No  . Sexual activity: Yes  Other Topics Concern  . Not on file   Social History Narrative   Lives at home with husband.   Caffeine use: 1-2 drinks per month         Epworth Sleepiness Scale = 15 (as of 01/01/15)   Social Determinants of Health   Financial Resource Strain: Not on file  Food Insecurity: Not on file  Transportation Needs: Not on file  Physical Activity: Not on file  Stress: Not on file  Social Connections: Not on file  Intimate Partner Violence: Not on file    Hospitiliaztions: None  Health Maintenance:    Flu: 10/2019  Tetanus: 12/2008  Pneumovax: never  Prevnar: 11/2018  Covid: Pfizer  Zostavax: never  Shingrix: never  Mammogram: 09/2019  Pap Smear: hysterectomy  Bone Density: 08/2017  Colon Screening: 01/2011  Eye Doctor: annually  Dental Exam: biannually   Providers:   PCP: Webb Silversmith, NP             Endocrinologist: Dr. Gabriel Carina             Podiatry: Dr. Paulla Dolly             Oncology: Dr. Lindi Adie             Cardiology: Dr. Gwenlyn Found  Nephrologist: Dr. Rolly Salter   I have personally reviewed and have noted:  1. The patient's medical and social history 2. Their use of alcohol, tobacco or illicit drugs 3. Their current medications and supplements 4. The patient's functional ability including ADL's, fall risks, home safety risks and hearing or visual impairment. 5. Diet and physical activities 6. Evidence for depression or mood disorder  Subjective:   Review of Systems:   Constitutional: Pt reports intermittent headaches, fatigue. Denies fever, malaise,  or abrupt weight changes.  HEENT: Pt reports post nasal drip. Denies eye pain, eye redness, ear pain, ringing in the ears, wax buildup, runny nose, nasal congestion, bloody nose, or sore throat. Respiratory: Denies difficulty breathing, shortness of breath, cough or sputum production.   Cardiovascular: Denies chest pain, chest tightness, palpitations or swelling in the hands or feet.  Gastrointestinal: Denies abdominal pain, bloating, constipation, diarrhea or  blood in the stool.  GU: Denies urgency, frequency, pain with urination, burning sensation, blood in urine, odor or discharge. Musculoskeletal: Denies decrease in range of motion, difficulty with gait, muscle pain or joint pain and swelling.  Skin: Denies redness, rashes, lesions or ulcercations.  Neurological: Denies dizziness, difficulty with memory, difficulty with speech or problems with balance and coordination.  Psych: Pt has a history of depression. Denies anxiety, SI/HI.  No  other specific complaints in a complete review of systems (except as listed in HPI above).  Objective:  PE:  BP 128/88 (BP Location: Left Arm, Patient Position: Sitting, Cuff Size: Large)   Pulse 77   Temp 97.8 F (36.6 C)   Ht 5' 5"  (1.651 m)   Wt 213 lb (96.6 kg)   SpO2 99%   BMI 35.45 kg/m   Wt Readings from Last 3 Encounters:  09/09/19 216 lb 9.6 oz (98.2 kg)  06/27/19 215 lb (97.5 kg)  05/30/19 216 lb (98 kg)    General: Appears her stated age, obese, in NAD. Skin: Warm, dry and intact. No  ulcerations noted. HEENT: Head: normal shape and size; Eyes: sclera white, no icterus, conjunctiva pink, PERRLA and EOMs intact;  Neck: Neck supple, trachea midline. No masses, lumps or thyromegaly present.  Cardiovascular: Normal rate and rhythm. S1,S2 noted.  No murmur, rubs or gallops noted. No JVD or BLE edema. No carotid bruits noted. Pulmonary/Chest: Normal effort and positive vesicular breath sounds. No respiratory distress. No wheezes, rales or ronchi noted.  Abdomen: Soft and nontender. Normal bowel sounds. No distention or masses noted. Liver, spleen and kidneys non palpable. Musculoskeletal: Strength 5/5 BUE/BLE. No signs of joint swelling.  Neurological: Alert and oriented. Cranial nerves II-XII grossly intact. Coordination normal.  Psychiatric: Mood and affect normal. Behavior is normal. Judgment and thought content normal.     BMET    Component Value Date/Time   NA 138 05/30/2019 0847    NA 146 (H) 01/20/2015 1457   NA 143 10/12/2013 1510   K 4.0 05/30/2019 0847   K 3.9 10/12/2013 1510   CL 104 05/30/2019 0847   CL 109 (H) 10/12/2013 1510   CO2 31 05/30/2019 0847   CO2 29 10/12/2013 1510   GLUCOSE 202 (H) 05/30/2019 0847   GLUCOSE 92 10/12/2013 1510   BUN 20 05/30/2019 0847   BUN 17 01/20/2015 1457   BUN 16 10/12/2013 1510   CREATININE 1.29 (H) 05/30/2019 0847   CREATININE 1.15 (H) 10/12/2015 1603   CALCIUM 9.5 05/30/2019 0847   CALCIUM 9.4 10/12/2013 1510   GFRNONAA >60 01/24/2018 2159   GFRNONAA 56 (L) 10/12/2013 1510   GFRAA >60 01/24/2018 2159   GFRAA >60 10/12/2013 1510    Lipid Panel     Component Value Date/Time   CHOL 160 05/30/2019 0847   CHOL 179 10/17/2014 1527   TRIG 117.0 05/30/2019 0847   HDL 52.30 05/30/2019 0847   HDL 59 10/17/2014 1527   CHOLHDL 3 05/30/2019 0847   VLDL 23.4 05/30/2019 0847   LDLCALC 84 05/30/2019 0847   LDLCALC 91 10/17/2014 1527    CBC    Component Value Date/Time   WBC 4.1 05/30/2019 0847   RBC 4.25 05/30/2019 0847   HGB 12.5 05/30/2019 0847   HGB 13.5 10/12/2013 1510   HGB 12.8 08/13/2009 0946   HCT 37.2 05/30/2019 0847   HCT 40.5 10/12/2013 1510   HCT 36.8 08/13/2009 0946   PLT 231.0 05/30/2019 0847   PLT 278 10/12/2013 1510   PLT 271 08/13/2009 0946   MCV 87.5 05/30/2019 0847   MCV 87 10/12/2013 1510   MCV 87.0 08/13/2009 0946   MCH 28.7 01/24/2018 2159   MCHC 33.6 05/30/2019 0847   RDW 14.0 05/30/2019 0847   RDW 13.6 10/12/2013 1510   RDW 13.8 08/13/2009 0946   LYMPHSABS 2.1 01/24/2018 2159   LYMPHSABS 1.5 08/13/2009 0946   MONOABS 0.3 01/24/2018 2159   MONOABS 0.2  08/13/2009 0946   EOSABS 0.1 01/24/2018 2159   EOSABS 0.1 08/13/2009 0946   BASOSABS 0.1 01/24/2018 2159   BASOSABS 0.0 08/13/2009 0946    Hgb A1C Lab Results  Component Value Date   HGBA1C 6.6 (A) 09/09/2019      Assessment and Plan:   Medicare Annual Wellness Visit:  Diet: She does eat meat. She consumes fruits and  veggies daily. She tries to avoid fried foods, but does eat it some. She drinks mostly water. Physical activity: Walking, stretches Depression/mood screen: Chronic, PHQ 9 score of 0 Hearing: Intact to whispered voice Visual acuity: Grossly normal, performs annual eye exam  ADLs: Capable Fall risk: None Home safety: Good Cognitive evaluation: Intact to orientation, naming, recall and repetition EOL planning: Adv directives, full code/ I agree  Preventative Medicine: Flu, Tetanus, Prevnar and Covid UTD.  Pneumovax today.  She will consider shingles vaccine.  Mammogram UTD.  She no longer needs Pap smears.  Bone density UTD.  Colon screening UTD.  Encouraged her to consume a balanced diet exercise regimen.  Advised her to see an eye doctor and dentist annually.  Will check CBC, C met, lipid, A1c, vitamin D and B12 today.  Due dates for screening exams given to patient as part of her AVS   Next appointment: 6 months, follow up chronic conditions.   Webb Silversmith, NP This visit occurred during the SARS-CoV-2 public health emergency.  Safety protocols were in place, including screening questions prior to the visit, additional usage of staff PPE, and extensive cleaning of exam room while observing appropriate contact time as indicated for disinfecting solutions.

## 2020-01-09 NOTE — Assessment & Plan Note (Signed)
In remission Continue Anastrozole She will continue to follow with oncology

## 2020-01-09 NOTE — Assessment & Plan Note (Signed)
CMET today She will continue to follow with nephrology 

## 2020-01-09 NOTE — Assessment & Plan Note (Signed)
Continue goodies powders as needed

## 2020-01-21 ENCOUNTER — Other Ambulatory Visit: Payer: Self-pay | Admitting: Internal Medicine

## 2020-02-19 DIAGNOSIS — I129 Hypertensive chronic kidney disease with stage 1 through stage 4 chronic kidney disease, or unspecified chronic kidney disease: Secondary | ICD-10-CM | POA: Diagnosis not present

## 2020-02-19 DIAGNOSIS — N1831 Chronic kidney disease, stage 3a: Secondary | ICD-10-CM | POA: Diagnosis not present

## 2020-02-19 DIAGNOSIS — D631 Anemia in chronic kidney disease: Secondary | ICD-10-CM | POA: Diagnosis not present

## 2020-02-19 DIAGNOSIS — E1122 Type 2 diabetes mellitus with diabetic chronic kidney disease: Secondary | ICD-10-CM | POA: Diagnosis not present

## 2020-02-26 ENCOUNTER — Ambulatory Visit (INDEPENDENT_AMBULATORY_CARE_PROVIDER_SITE_OTHER): Payer: Medicare Other | Admitting: Internal Medicine

## 2020-02-26 ENCOUNTER — Other Ambulatory Visit: Payer: Self-pay

## 2020-02-26 ENCOUNTER — Encounter: Payer: Self-pay | Admitting: Internal Medicine

## 2020-02-26 ENCOUNTER — Ambulatory Visit (INDEPENDENT_AMBULATORY_CARE_PROVIDER_SITE_OTHER)
Admission: RE | Admit: 2020-02-26 | Discharge: 2020-02-26 | Disposition: A | Discharge status: Home / Self Care | Payer: Medicare Other | Source: Ambulatory Visit | Attending: Internal Medicine | Admitting: Internal Medicine

## 2020-02-26 VITALS — BP 118/76 | HR 86 | Temp 97.0°F | Wt 216.0 lb

## 2020-02-26 DIAGNOSIS — I129 Hypertensive chronic kidney disease with stage 1 through stage 4 chronic kidney disease, or unspecified chronic kidney disease: Secondary | ICD-10-CM | POA: Diagnosis not present

## 2020-02-26 DIAGNOSIS — M25562 Pain in left knee: Secondary | ICD-10-CM

## 2020-02-26 DIAGNOSIS — E1122 Type 2 diabetes mellitus with diabetic chronic kidney disease: Secondary | ICD-10-CM | POA: Diagnosis not present

## 2020-02-26 DIAGNOSIS — S8992XA Unspecified injury of left lower leg, initial encounter: Secondary | ICD-10-CM | POA: Diagnosis not present

## 2020-02-26 DIAGNOSIS — M1712 Unilateral primary osteoarthritis, left knee: Secondary | ICD-10-CM | POA: Diagnosis not present

## 2020-02-26 DIAGNOSIS — D631 Anemia in chronic kidney disease: Secondary | ICD-10-CM | POA: Diagnosis not present

## 2020-02-26 DIAGNOSIS — N1831 Chronic kidney disease, stage 3a: Secondary | ICD-10-CM | POA: Diagnosis not present

## 2020-02-26 MED ORDER — DEXAMETHASONE SODIUM PHOSPHATE 100 MG/10ML IJ SOLN
10.0000 mg | Freq: Once | INTRAMUSCULAR | Status: AC
  Administered 2020-02-26: 10 mg via INTRAMUSCULAR

## 2020-02-26 NOTE — Patient Instructions (Signed)
Journal for Nurse Practitioners, 15(4), 263-267. Retrieved October 23, 2017 from http://clinicalkey.com/nursing">  Knee Exercises Ask your health care provider which exercises are safe for you. Do exercises exactly as told by your health care provider and adjust them as directed. It is normal to feel mild stretching, pulling, tightness, or discomfort as you do these exercises. Stop right away if you feel sudden pain or your pain gets worse. Do not begin these exercises until told by your health care provider. Stretching and range-of-motion exercises These exercises warm up your muscles and joints and improve the movement and flexibility of your knee. These exercises also help to relieve pain and swelling. Knee extension, prone 1. Lie on your abdomen (prone position) on a bed. 2. Place your left / right knee just beyond the edge of the surface so your knee is not on the bed. You can put a towel under your left / right thigh just above your kneecap for comfort. 3. Relax your leg muscles and allow gravity to straighten your knee (extension). You should feel a stretch behind your left / right knee. 4. Hold this position for __________ seconds. 5. Scoot up so your knee is supported between repetitions. Repeat __________ times. Complete this exercise __________ times a day. Knee flexion, active 1. Lie on your back with both legs straight. If this causes back discomfort, bend your left / right knee so your foot is flat on the floor. 2. Slowly slide your left / right heel back toward your buttocks. Stop when you feel a gentle stretch in the front of your knee or thigh (flexion). 3. Hold this position for __________ seconds. 4. Slowly slide your left / right heel back to the starting position. Repeat __________ times. Complete this exercise __________ times a day.   Quadriceps stretch, prone 1. Lie on your abdomen on a firm surface, such as a bed or padded floor. 2. Bend your left / right knee and hold  your ankle. If you cannot reach your ankle or pant leg, loop a belt around your foot and grab the belt instead. 3. Gently pull your heel toward your buttocks. Your knee should not slide out to the side. You should feel a stretch in the front of your thigh and knee (quadriceps). 4. Hold this position for __________ seconds. Repeat __________ times. Complete this exercise __________ times a day.   Hamstring, supine 1. Lie on your back (supine position). 2. Loop a belt or towel over the ball of your left / right foot. The ball of your foot is on the walking surface, right under your toes. 3. Straighten your left / right knee and slowly pull on the belt to raise your leg until you feel a gentle stretch behind your knee (hamstring). ? Do not let your knee bend while you do this. ? Keep your other leg flat on the floor. 4. Hold this position for __________ seconds. Repeat __________ times. Complete this exercise __________ times a day. Strengthening exercises These exercises build strength and endurance in your knee. Endurance is the ability to use your muscles for a long time, even after they get tired. Quadriceps, isometric This exercise stretches the muscles in front of your thigh (quadriceps) without moving your knee joint (isometric). 1. Lie on your back with your left / right leg extended and your other knee bent. Put a rolled towel or small pillow under your knee if told by your health care provider. 2. Slowly tense the muscles in the front of your   left / right thigh. You should see your kneecap slide up toward your hip or see increased dimpling just above the knee. This motion will push the back of the knee toward the floor. 3. For __________ seconds, hold the muscle as tight as you can without increasing your pain. 4. Relax the muscles slowly and completely. Repeat __________ times. Complete this exercise __________ times a day.   Straight leg raises This exercise stretches the muscles in  front of your thigh (quadriceps) and the muscles that move your hips (hip flexors). 1. Lie on your back with your left / right leg extended and your other knee bent. 2. Tense the muscles in the front of your left / right thigh. You should see your kneecap slide up or see increased dimpling just above the knee. Your thigh may even shake a bit. 3. Keep these muscles tight as you raise your leg 4-6 inches (10-15 cm) off the floor. Do not let your knee bend. 4. Hold this position for __________ seconds. 5. Keep these muscles tense as you lower your leg. 6. Relax your muscles slowly and completely after each repetition. Repeat __________ times. Complete this exercise __________ times a day. Hamstring, isometric 1. Lie on your back on a firm surface. 2. Bend your left / right knee about __________ degrees. 3. Dig your left / right heel into the surface as if you are trying to pull it toward your buttocks. Tighten the muscles in the back of your thighs (hamstring) to "dig" as hard as you can without increasing any pain. 4. Hold this position for __________ seconds. 5. Release the tension gradually and allow your muscles to relax completely for __________ seconds after each repetition. Repeat __________ times. Complete this exercise __________ times a day. Hamstring curls If told by your health care provider, do this exercise while wearing ankle weights. Begin with __________ lb weights. Then increase the weight by 1 lb (0.5 kg) increments. Do not wear ankle weights that are more than __________ lb. 1. Lie on your abdomen with your legs straight. 2. Bend your left / right knee as far as you can without feeling pain. Keep your hips flat against the floor. 3. Hold this position for __________ seconds. 4. Slowly lower your leg to the starting position. Repeat __________ times. Complete this exercise __________ times a day.   Squats This exercise strengthens the muscles in front of your thigh and knee  (quadriceps). 1. Stand in front of a table, with your feet and knees pointing straight ahead. You may rest your hands on the table for balance but not for support. 2. Slowly bend your knees and lower your hips like you are going to sit in a chair. ? Keep your weight over your heels, not over your toes. ? Keep your lower legs upright so they are parallel with the table legs. ? Do not let your hips go lower than your knees. ? Do not bend lower than told by your health care provider. ? If your knee pain increases, do not bend as low. 3. Hold the squat position for __________ seconds. 4. Slowly push with your legs to return to standing. Do not use your hands to pull yourself to standing. Repeat __________ times. Complete this exercise __________ times a day. Wall slides This exercise strengthens the muscles in front of your thigh and knee (quadriceps). 1. Lean your back against a smooth wall or door, and walk your feet out 18-24 inches (46-61 cm) from it. 2.   Place your feet hip-width apart. 3. Slowly slide down the wall or door until your knees bend __________ degrees. Keep your knees over your heels, not over your toes. Keep your knees in line with your hips. 4. Hold this position for __________ seconds. Repeat __________ times. Complete this exercise __________ times a day.   Straight leg raises This exercise strengthens the muscles that rotate the leg at the hip and move it away from your body (hip abductors). 1. Lie on your side with your left / right leg in the top position. Lie so your head, shoulder, knee, and hip line up. You may bend your bottom knee to help you keep your balance. 2. Roll your hips slightly forward so your hips are stacked directly over each other and your left / right knee is facing forward. 3. Leading with your heel, lift your top leg 4-6 inches (10-15 cm). You should feel the muscles in your outer hip lifting. ? Do not let your foot drift forward. ? Do not let your  knee roll toward the ceiling. 4. Hold this position for __________ seconds. 5. Slowly return your leg to the starting position. 6. Let your muscles relax completely after each repetition. Repeat __________ times. Complete this exercise __________ times a day.   Straight leg raises This exercise stretches the muscles that move your hips away from the front of the pelvis (hip extensors). 1. Lie on your abdomen on a firm surface. You can put a pillow under your hips if that is more comfortable. 2. Tense the muscles in your buttocks and lift your left / right leg about 4-6 inches (10-15 cm). Keep your knee straight as you lift your leg. 3. Hold this position for __________ seconds. 4. Slowly lower your leg to the starting position. 5. Let your leg relax completely after each repetition. Repeat __________ times. Complete this exercise __________ times a day. This information is not intended to replace advice given to you by your health care provider. Make sure you discuss any questions you have with your health care provider. Document Revised: 10/24/2017 Document Reviewed: 10/24/2017 [employer] Patient Education  2021 [employer]  

## 2020-02-26 NOTE — Progress Notes (Unsigned)
Subjective:    Patient ID: Carrie Singh, female    DOB: [birth year], 65 y.o.   MRN: [id number]  HPI  Patient presents the clinic today with complaint of left knee pain.  She reports this started 4 days ago.  She reports she woke up with the pain in her knee.  She describes the pain as achy and tight.  The pain does not radiate.  She has noticed some associated swelling.  She denies numbness, tingling or weakness of the left lower extremity.  She denies any injury that she is aware of.  She has tried rubbing alcohol and topical pain relief cream with minimal relief of symptoms.  Review of Systems  Past Medical History:  Diagnosis Date  . Breast cancer ([place])   . Breast cancer, right breast ([place]) 12/2007   a. s/p chemo, radiation, and lumpectomy with negative sentinel lymph node biopsy   . Depression   . Diabetes mellitus without complication ([place])   . GERD (gastroesophageal reflux disease)   . Hyperlipidemia   . Hypertension    resistant, negative renal Dopplers and negative CT angiogram  . Migraines   . Obesity   . Seizures ([place])    last seizure  1 week ago: states she jumps for a second and it's gone    Current Outpatient Medications  Medication Sig Dispense Refill  . ACCU-CHEK AVIVA PLUS test strip USE 1 STRIP AS INSTRUCTED  TWICE DAILY (Patient taking differently: 1 each by Other route 2 (two) times daily. E11.9) 200 strip 3  . Accu-Chek Softclix Lancets lancets USE TWICE DAILY (Patient taking differently: 1 each by Other route 2 (two) times daily. E11.9) 200 each 3  . anastrozole (ARIMIDEX) 1 MG tablet Take 1 tablet (1 mg total) by mouth daily. 90 tablet 3  . Biotin w/ Vitamins C & E (HAIR SKIN & NAILS GUMMIES PO) Take 1 tablet by mouth daily.     . carvedilol (COREG) 12.5 MG tablet Take 12.5 mg by mouth in the morning and at bedtime.    . CHROMIUM PICOLINATE PO Take by mouth.    . dapagliflozin propanediol (FARXIGA) 5 MG TABS tablet Take 1 tablet by mouth daily.    .  hydrALAZINE (APRESOLINE) 100 MG tablet Take 1 tablet (100 mg total) by mouth 3 (three) times daily. 270 tablet 3  . irbesartan (AVAPRO) 300 MG tablet TAKE 1 TABLET BY MOUTH  DAILY 90 tablet 3  . metFORMIN (GLUCOPHAGE) 1000 MG tablet Take 1 tablet by mouth in the morning and at bedtime.    . Multiple Vitamins-Minerals (ONE-A-DAY WOMENS VITACRAVES) CHEW Chew 1 tablet by mouth daily.     . pravastatin (PRAVACHOL) 40 MG tablet TAKE 1 TABLET BY MOUTH  DAILY 90 tablet 2   No current facility-administered medications for this visit.    No Known Allergies  Family History  Problem Relation Age of Onset  . Stroke Mother   . Heart disease Father   . Asthma Father   . Cancer Father        colon  . Cancer Sister        lung  . Cancer Brother        colon  . Diabetes Brother   . Breast cancer Other 79    Social History   Socioeconomic History  . Marital status: Married    Spouse name: [name]  . Number of children: Not on file  . Years of education: 12+  . Highest education level: Not on  file  Occupational History  . Occupation: [employer]  Tobacco Use  . Smoking status: Never Smoker  . Smokeless tobacco: Never Used  Vaping Use  . Vaping Use: Never used  Substance and Sexual Activity  . Alcohol use: No    Alcohol/week: 0.0 standard drinks  . Drug use: No  . Sexual activity: Yes  Other Topics Concern  . Not on file  Social History Narrative   Lives at home with husband.   Caffeine use: 1-2 drinks per month         Epworth Sleepiness Scale = 15 (as of 01/01/15)   Social Determinants of Health   Financial Resource Strain: Not on file  Food Insecurity: Not on file  Transportation Needs: Not on file  Physical Activity: Not on file  Stress: Not on file  Social Connections: Not on file  Intimate Partner Violence: Not on file     Constitutional: Denies fever, malaise, fatigue, headache or abrupt weight changes.  Respiratory: Denies difficulty breathing, shortness of breath,  cough or sputum production.   Cardiovascular: Denies chest pain, chest tightness, palpitations or swelling in the hands or feet.  Musculoskeletal: Patient reports decrease in range of motion, difficulty with gait, joint pain and swelling.  Denies muscle pain.    No other specific complaints in a complete review of systems (except as listed in HPI above).     Objective:   Physical Exam   BP 118/76   Pulse 86   Temp (!) 97 F (36.1 C) (Temporal)   Wt 216 lb (98 kg)   SpO2 98%   BMI 35.94 kg/m   Wt Readings from Last 3 Encounters:  01/09/20 213 lb (96.6 kg)  09/09/19 216 lb 9.6 oz (98.2 kg)  06/27/19 215 lb (97.5 kg)    General: Appears her stated age, obese, in NAD. Skin: Warm, dry and intact. No redness or warmth noted. Cardiovascular: Normal rate. Pulmonary/Chest: Normal effort. Musculoskeletal: Decreased flexion of the left knee.  Normal extension of the left knee.  Pain with palpation over bilateral joint lines and popliteal fossa.  Swelling noted over the patella.  Negative Lachman's.  Negative McMurray.  Limping gait, using cane for assistance. Neurological: Alert and oriented.    BMET    Component Value Date/Time   NA 140 01/09/2020 0957   NA 146 (H) 01/20/2015 1457   NA 143 10/12/2013 1510   K 4.4 01/09/2020 0957   K 3.9 10/12/2013 1510   CL 104 01/09/2020 0957   CL 109 (H) 10/12/2013 1510   CO2 31 01/09/2020 0957   CO2 29 10/12/2013 1510   GLUCOSE 131 (H) 01/09/2020 0957   GLUCOSE 92 10/12/2013 1510   BUN 17 01/09/2020 0957   BUN 17 01/20/2015 1457   BUN 16 10/12/2013 1510   CREATININE 1.30 (H) 01/09/2020 0957   CREATININE 1.15 (H) 10/12/2015 1603   CALCIUM 10.1 01/09/2020 0957   CALCIUM 9.4 10/12/2013 1510   GFRNONAA >60 01/24/2018 2159   GFRNONAA 56 (L) 10/12/2013 1510   GFRAA >60 01/24/2018 2159   GFRAA >60 10/12/2013 1510    Lipid Panel     Component Value Date/Time   CHOL 144 01/09/2020 0957   CHOL 179 10/17/2014 1527   TRIG 76.0  01/09/2020 0957   HDL 61.50 01/09/2020 0957   HDL 59 10/17/2014 1527   CHOLHDL 2 01/09/2020 0957   VLDL 15.2 01/09/2020 0957   LDLCALC 68 01/09/2020 0957   LDLCALC 91 10/17/2014 1527    CBC  Component Value Date/Time   WBC 3.8 (L) 01/09/2020 0957   RBC 4.54 01/09/2020 0957   HGB 13.1 01/09/2020 0957   HGB 13.5 10/12/2013 1510   HGB 12.8 08/13/2009 0946   HCT 39.5 01/09/2020 0957   HCT 40.5 10/12/2013 1510   HCT 36.8 08/13/2009 0946   PLT 243.0 01/09/2020 0957   PLT 278 10/12/2013 1510   PLT 271 08/13/2009 0946   MCV 86.9 01/09/2020 0957   MCV 87 10/12/2013 1510   MCV 87.0 08/13/2009 0946   MCH 28.7 01/24/2018 2159   MCHC 33.2 01/09/2020 0957   RDW 13.8 01/09/2020 0957   RDW 13.6 10/12/2013 1510   RDW 13.8 08/13/2009 0946   LYMPHSABS 2.1 01/24/2018 2159   LYMPHSABS 1.5 08/13/2009 0946   MONOABS 0.3 01/24/2018 2159   MONOABS 0.2 08/13/2009 0946   EOSABS 0.1 01/24/2018 2159   EOSABS 0.1 08/13/2009 0946   BASOSABS 0.1 01/24/2018 2159   BASOSABS 0.0 08/13/2009 0946    Hgb A1C Lab Results  Component Value Date   HGBA1C 6.8 (H) 01/09/2020           Assessment & Plan:  Acute Left Knee Pain:  Decadron 10 mg IM x1 X-ray left knee for further evaluation Ice for 10 minutes twice daily Wear compression sleeve for stability Continue use cane for support  We will follow-up after x-ray, return precautions discussed Webb Silversmith, NP This visit occurred during the SARS-CoV-2 public health emergency.  Safety protocols were in place, including screening questions prior to the visit, additional usage of staff PPE, and extensive cleaning of exam room while observing appropriate contact time as indicated for disinfecting solutions.

## 2020-02-27 ENCOUNTER — Ambulatory Visit: Payer: Medicare Other | Admitting: Internal Medicine

## 2020-02-27 ENCOUNTER — Encounter: Payer: Self-pay | Admitting: Internal Medicine

## 2020-03-05 DIAGNOSIS — E119 Type 2 diabetes mellitus without complications: Secondary | ICD-10-CM | POA: Diagnosis not present

## 2020-03-15 ENCOUNTER — Other Ambulatory Visit: Payer: Self-pay | Admitting: Internal Medicine

## 2020-03-20 DIAGNOSIS — N1831 Chronic kidney disease, stage 3a: Secondary | ICD-10-CM | POA: Diagnosis not present

## 2020-03-20 DIAGNOSIS — E1122 Type 2 diabetes mellitus with diabetic chronic kidney disease: Secondary | ICD-10-CM | POA: Diagnosis not present

## 2020-03-27 DIAGNOSIS — N182 Chronic kidney disease, stage 2 (mild): Secondary | ICD-10-CM | POA: Diagnosis not present

## 2020-03-27 DIAGNOSIS — E1122 Type 2 diabetes mellitus with diabetic chronic kidney disease: Secondary | ICD-10-CM | POA: Diagnosis not present

## 2020-04-22 ENCOUNTER — Other Ambulatory Visit: Payer: Self-pay | Admitting: Internal Medicine

## 2020-04-28 ENCOUNTER — Encounter: Payer: Self-pay | Admitting: Internal Medicine

## 2020-04-30 MED ORDER — ONETOUCH DELICA LANCING DEV MISC: 1.0000 | Freq: Once | 0 refills | Status: AC

## 2020-04-30 NOTE — Addendum Note (Signed)
Addended by: Lurlean Nanny on: 04/30/2020 06:13 PM   Modules accepted: Orders

## 2020-06-06 ENCOUNTER — Other Ambulatory Visit: Payer: Self-pay | Admitting: Hematology and Oncology

## 2020-06-10 ENCOUNTER — Other Ambulatory Visit: Payer: Self-pay | Admitting: Endocrinology

## 2020-06-23 ENCOUNTER — Telehealth: Payer: Self-pay | Admitting: Hematology and Oncology

## 2020-06-23 NOTE — Telephone Encounter (Signed)
Scheduled appointment per 06/07 sch msg. Patient is aware. 

## 2020-06-25 ENCOUNTER — Ambulatory Visit: Payer: Medicare Other | Admitting: Hematology and Oncology

## 2020-07-04 NOTE — Progress Notes (Signed)
Patient Care Team: Carrie Fenton, NP as PCP - General (Internal Medicine) Carrie Merritts, MD as Consulting Physician (Cardiology) Carrie Lose, MD as Consulting Physician (Hematology and Oncology) Carrie Overall, MD as Consulting Physician (General Surgery) Carrie Singh, Charlestine Massed, NP as Nurse Practitioner (Hematology and Oncology)  DIAGNOSIS:    ICD-10-CM   1. Malignant neoplasm of lower-outer quadrant of right breast of female, estrogen receptor positive ([place])  C50.511    Z17.0       SUMMARY OF ONCOLOGIC HISTORY: Oncology History  Malignant neoplasm of lower-outer quadrant of right breast of female, estrogen receptor positive ([place])  12/18/2007 Initial Diagnosis   Right breast cancer treated with lumpectomy followed by radiation and 5 years of antiestrogen therapy with aromatase inhibitor    02/02/2016 Relapse/Recurrence   Right breast biopsy 6:00 position: IDC with DCIS grade 2, ER 100%, PR 90%, Ki-67 10%, HER-2 negative ratio 1.28, screening detected right breast lumps 0.5 cm and 0.3 cm, T1a N0 stage IA clinical stage   03/18/2016 Surgery   Right simple mastectomy: Grade 2 IDC with DCIS 1.5 cm, margins -0/3 lymph nodes, T1b N0 stage IA, ER 100%, PR 90%, Ki-67 10%, HER-2 negative ratio 1.28    03/18/2016 Oncotype testing   11/7%    04/2016 -  Anti-estrogen oral therapy   Letrozole 2.26m daily switch to exemestane 25 mg daily May 2018 switched to anastrozole 1 mg daily due to cost      CHIEF COMPLIANT:  Follow-up of right breast cancer on anastrozole therapy  INTERVAL HISTORY: Carrie Singh a 65y.o. with above-mentioned history of right breast cancer treated with mastectomy followed by antiestrogen therapy with anastrozole. Mammogram on 09/27/19 showed no evidence of malignancy in the left breast. She presents to the clinic today for annual follow-up.   ALLERGIES:  has No Known Allergies.  MEDICATIONS:  Current Outpatient Medications  Medication Sig Dispense  Refill   anastrozole (ARIMIDEX) 1 MG tablet TAKE 1 TABLET BY MOUTH  DAILY 90 tablet 0   Biotin w/ Vitamins C & E (HAIR SKIN & NAILS GUMMIES PO) Take 1 tablet by mouth daily.      carvedilol (COREG) 12.5 MG tablet Take 12.5 mg by mouth in the morning and at bedtime.     CHROMIUM PICOLINATE PO Take by mouth.     glucose blood (ONETOUCH ULTRA) test strip 1 each by Other route 2 (two) times daily. E11.9 200 strip 1   hydrALAZINE (APRESOLINE) 100 MG tablet Take 1 tablet (100 mg total) by mouth 3 (three) times daily. 270 tablet 3   irbesartan (AVAPRO) 300 MG tablet TAKE 1 TABLET BY MOUTH  DAILY 90 tablet 1   Lancets (ONETOUCH DELICA PLUS [id number] MISC CHECK BLOOD SUGAR TWICE  DAILY 200 each 3   metFORMIN (GLUCOPHAGE) 1000 MG tablet Take 1 tablet by mouth in the morning and at bedtime.     Multiple Vitamins-Minerals (ONE-A-DAY WOMENS VITACRAVES) CHEW Chew 1 tablet by mouth daily.      pravastatin (PRAVACHOL) 40 MG tablet TAKE 1 TABLET BY MOUTH  DAILY 90 tablet 2   No current facility-administered medications for this visit.    PHYSICAL EXAMINATION: ECOG PERFORMANCE STATUS: 1 - Symptomatic but completely ambulatory  Vitals:   07/06/20 0943  BP: (!) 156/85  Pulse: 75  Resp: 18  Temp: (!) 97.5 F (36.4 C)  SpO2: 100%   Filed Weights   07/06/20 0943  Weight: 213 lb 9.6 oz (96.9 kg)    BREAST: Right mastectomy  no palpable lumps or nodules.  No lumps nodules in the left breast or axilla.  (exam performed in the presence of a chaperone)  LABORATORY DATA:  I have reviewed the data as listed CMP Latest Ref Rng & Units 01/09/2020 05/30/2019 02/08/2019  Glucose 70 - 99 mg/dL 131(H) 202(H) 114(H)  BUN 6 - 23 mg/dL 17 20 19   Creatinine 0.40 - 1.20 mg/dL 1.30(H) 1.29(H) 1.24(H)  Sodium 135 - 145 mEq/L 140 138 141  Potassium 3.5 - 5.1 mEq/L 4.4 4.0 4.0  Chloride 96 - 112 mEq/L 104 104 104  CO2 19 - 32 mEq/L 31 31 29   Calcium 8.4 - 10.5 mg/dL 10.1 9.5 9.8  Total Protein 6.0 - 8.3 g/dL 7.2 6.8  -  Total Bilirubin 0.2 - 1.2 mg/dL 1.2 1.1 -  Alkaline Phos 39 - 117 U/L 68 78 -  AST 0 - 37 U/L 14 15 -  ALT 0 - 35 U/L 14 14 -    Lab Results  Component Value Date   WBC 3.8 (L) 01/09/2020   HGB 13.1 01/09/2020   HCT 39.5 01/09/2020   MCV 86.9 01/09/2020   PLT 243.0 01/09/2020   NEUTROABS 2.7 01/24/2018    ASSESSMENT & PLAN:  Malignant neoplasm of lower-outer quadrant of right breast of female, estrogen receptor positive ([place]) Right simple mastectomy: Grade 2 IDC with DCIS 1.5 cm, margins -0/3 lymph nodes, T1b N0 stage IA, ER 100%, PR 90%, Ki-67 10%, HER-2 negative ratio 1.28 Oncotype DX score 11:7% risk of recurrence (2009 right breast cancer treated with lumpectomy radiation and 5 years of antiestrogen therapy)   Current treatment: Letrozole 2.5 mg daily started 04/2016 switched to exemestane 05/26/2016 switched to anastrozole I discussed with her about staying on it for total of 7 years because this is a breast cancer that recurred 2 years after her initial therapy.  Breast Cancer Surveillance: 1. Breast exam  07/06/2020: No palpable lumps or nodules of concern 2. Mammogram left breast: 09/27/2019: Benign, breast density category B 3. bone density: 08/23/2017: T score -0.1: Normal.     CT chest and MRI brain 05/31/2017: Negative for metastatic disease X-ray lumbar spine 04/23/2018: Degenerative joint disease L4-L5   Anastrozole toxicities: Muscle and joint pains are tolerable I encouraged her to walk daily for exercise.  Return to clinic in 1 year for follow-up    No orders of the defined types were placed in this encounter.  The patient has a good understanding of the Singh plan. she agrees with it. she will call with any problems that may develop before the next visit here.  Total time spent: 20 mins including face to face time and time spent for planning, charting and coordination of care  Rulon Eisenmenger, MD, MPH 07/06/2020  I, Thana Ates, am acting as scribe for  Dr. Nicholas Singh.  I have reviewed the above documentation for accuracy and completeness, and I agree with the above.

## 2020-07-05 NOTE — Assessment & Plan Note (Signed)
Right simple mastectomy: Grade 2 IDC with DCIS 1.5 cm, margins -0/3 lymph nodes, T1b N0 stage IA, ER 100%, PR 90%, Ki-67 10%, HER-2 negative ratio 1.28 Oncotype DX score 11:7% risk of recurrence  Current treatment: Letrozole 2.5 mg daily started 04/2016 switched to exemestane 05/10/2018switched to anastrozole  Breast Cancer Surveillance: 1. Breast exam6/20/2022:No palpable lumps or nodules of concern 2. Mammogramleft breast: 09/27/2019: Benign, breast density category B 3.bone density: 08/23/2017: T score -0.1: Normal.   CT chest and MRI brain 05/31/2017: Negative for metastatic disease X-ray lumbar spine 04/23/2018: Degenerative joint disease L4-L5  Anastrozoletoxicities: Muscle and joint painsare tolerable  Return to clinic in 1 year for follow-up

## 2020-07-06 ENCOUNTER — Inpatient Hospital Stay: Payer: Medicare Other | Attending: Hematology and Oncology | Admitting: Hematology and Oncology

## 2020-07-06 ENCOUNTER — Other Ambulatory Visit: Payer: Self-pay

## 2020-07-06 VITALS — BP 156/85 | HR 75 | Temp 97.5°F | Resp 18 | Ht 65.0 in | Wt 213.6 lb

## 2020-07-06 DIAGNOSIS — Z9011 Acquired absence of right breast and nipple: Secondary | ICD-10-CM | POA: Insufficient documentation

## 2020-07-06 DIAGNOSIS — Z79899 Other long term (current) drug therapy: Secondary | ICD-10-CM | POA: Insufficient documentation

## 2020-07-06 DIAGNOSIS — M2550 Pain in unspecified joint: Secondary | ICD-10-CM | POA: Insufficient documentation

## 2020-07-06 DIAGNOSIS — C50511 Malignant neoplasm of lower-outer quadrant of right female breast: Secondary | ICD-10-CM | POA: Diagnosis not present

## 2020-07-06 DIAGNOSIS — Z79811 Long term (current) use of aromatase inhibitors: Secondary | ICD-10-CM | POA: Diagnosis not present

## 2020-07-06 DIAGNOSIS — Z170 Estrogen receptor positive status [ER+]: Secondary | ICD-10-CM | POA: Insufficient documentation

## 2020-07-06 DIAGNOSIS — N959 Unspecified menopausal and perimenopausal disorder: Secondary | ICD-10-CM | POA: Diagnosis not present

## 2020-07-06 MED ORDER — ANASTROZOLE 1 MG PO TABS: 1.0000 mg | ORAL_TABLET | Freq: Every day | ORAL | 3 refills | Status: DC

## 2020-07-17 ENCOUNTER — Other Ambulatory Visit: Payer: Self-pay | Admitting: Adult Health

## 2020-07-17 DIAGNOSIS — Z1231 Encounter for screening mammogram for malignant neoplasm of breast: Secondary | ICD-10-CM

## 2020-08-07 ENCOUNTER — Encounter: Payer: Self-pay | Admitting: Internal Medicine

## 2020-08-08 ENCOUNTER — Other Ambulatory Visit: Payer: Self-pay

## 2020-08-08 ENCOUNTER — Encounter (HOSPITAL_BASED_OUTPATIENT_CLINIC_OR_DEPARTMENT_OTHER): Payer: Self-pay

## 2020-08-08 ENCOUNTER — Emergency Department (HOSPITAL_BASED_OUTPATIENT_CLINIC_OR_DEPARTMENT_OTHER)
Admission: EM | Admit: 2020-08-08 | Discharge: 2020-08-08 | Disposition: A | Discharge status: Home / Self Care | Payer: Medicare Other | Attending: Emergency Medicine | Admitting: Emergency Medicine

## 2020-08-08 DIAGNOSIS — R21 Rash and other nonspecific skin eruption: Secondary | ICD-10-CM | POA: Diagnosis present

## 2020-08-08 DIAGNOSIS — Z853 Personal history of malignant neoplasm of breast: Secondary | ICD-10-CM | POA: Insufficient documentation

## 2020-08-08 DIAGNOSIS — Z79899 Other long term (current) drug therapy: Secondary | ICD-10-CM | POA: Diagnosis not present

## 2020-08-08 DIAGNOSIS — I129 Hypertensive chronic kidney disease with stage 1 through stage 4 chronic kidney disease, or unspecified chronic kidney disease: Secondary | ICD-10-CM | POA: Diagnosis not present

## 2020-08-08 DIAGNOSIS — B029 Zoster without complications: Secondary | ICD-10-CM

## 2020-08-08 DIAGNOSIS — E1122 Type 2 diabetes mellitus with diabetic chronic kidney disease: Secondary | ICD-10-CM | POA: Diagnosis not present

## 2020-08-08 DIAGNOSIS — N183 Chronic kidney disease, stage 3 unspecified: Secondary | ICD-10-CM | POA: Diagnosis not present

## 2020-08-08 DIAGNOSIS — Z7984 Long term (current) use of oral hypoglycemic drugs: Secondary | ICD-10-CM | POA: Diagnosis not present

## 2020-08-08 MED ORDER — VALACYCLOVIR HCL 1 G PO TABS: 1000.0000 mg | ORAL_TABLET | Freq: Three times a day (TID) | ORAL | 0 refills | Status: DC

## 2020-08-08 MED ORDER — OXYCODONE HCL 5 MG PO TABS: 2.5000 mg | ORAL_TABLET | Freq: Every evening | ORAL | 0 refills | Status: DC | PRN

## 2020-08-08 NOTE — ED Notes (Signed)
ED Provider at bedside. 

## 2020-08-08 NOTE — ED Provider Notes (Signed)
[facility] EMERGENCY DEPARTMENT Provider Note   CSN: [id number] Arrival date & time: 08/08/20  0710     History Chief Complaint  Patient presents with  . Rash    Carrie Singh is a 65 y.o. female.  The history is provided by the patient.  Rash Location:  Torso Torso rash location:  R flank Quality: painful and redness   Pain details:    Quality: stabbing.   Severity:  Severe   Onset quality:  Gradual   Duration:  4 days   Timing:  Constant   Progression:  Unchanged Severity:  Moderate Onset quality:  Gradual Duration:  4 days Timing:  Constant Progression:  Worsening Chronicity:  New Context comment:  No known cause Relieved by:  Nothing Worsened by:  Nothing Ineffective treatments:  None tried Associated symptoms: no abdominal pain, no fever, no joint pain, no nausea, no shortness of breath, no sore throat and not vomiting       Past Medical History:  Diagnosis Date  . Breast cancer ([place])   . Breast cancer, right breast ([place]) 12/2007   a. s/p chemo, radiation, and lumpectomy with negative sentinel lymph node biopsy   . Depression   . Diabetes mellitus without complication ([place])   . GERD (gastroesophageal reflux disease)   . Hyperlipidemia   . Hypertension    resistant, negative renal Dopplers and negative CT angiogram  . Migraines   . Obesity   . Seizures ([place])    last seizure  1 week ago: states she jumps for a second and it's gone    Patient Active Problem List   Diagnosis Date Noted  . CKD (chronic kidney disease) stage 3, GFR 30-59 ml/min ([facility]) 05/30/2019  . GERD (gastroesophageal reflux disease) 11/30/2017  . Intractable episodic cluster headache 12/04/2014  . Depression with somatization 12/04/2014  . HTN (hypertension) 04/24/2013  . HLD (hyperlipidemia) 04/24/2013  . DM2 (diabetes mellitus, type 2) ([place]) 04/24/2013  . Malignant neoplasm of lower-outer quadrant of right breast of female, estrogen receptor positive ([place]) 12/18/2007     Past Surgical History:  Procedure Laterality Date  . ABDOMINAL HYSTERECTOMY    . BREAST LUMPECTOMY    . CYST EXCISION    . FOOT SURGERY    . MASTECTOMY Right   . MASTECTOMY W/ SENTINEL NODE BIOPSY Right 03/18/2016   Procedure: RIGHT MASTECTOMY WITH RIGHT AXILLARY SENTINEL LYMPH NODE BIOPSY;  Surgeon: Alphonsa Overall, MD;  Location: [facility];  Service: General;  Laterality: Right;  . ovary tumor    . vocal cord nodules       OB History   No obstetric history on file.     Family History  Problem Relation Age of Onset  . Stroke Mother   . Heart disease Father   . Asthma Father   . Cancer Father        colon  . Cancer Sister        lung  . Cancer Brother        colon  . Diabetes Brother   . Breast cancer Other 12    Social History   Tobacco Use  . Smoking status: Never  . Smokeless tobacco: Never  Vaping Use  . Vaping Use: Never used  Substance Use Topics  . Alcohol use: No    Alcohol/week: 0.0 standard drinks  . Drug use: No    Home Medications Prior to Admission medications   Medication Sig Start Date End Date Taking? Authorizing Provider  oxyCODONE (ROXICODONE)  5 MG immediate release tablet Take 0.5 tablets (2.5 mg total) by mouth at bedtime as needed for up to 4 days for moderate pain. 08/08/20 08/12/20 Yes Arnaldo Natal, MD  valACYclovir (VALTREX) 1000 MG tablet Take 1 tablet (1,000 mg total) by mouth 3 (three) times daily. 08/08/20  Yes Arnaldo Natal, MD  anastrozole (ARIMIDEX) 1 MG tablet Take 1 tablet (1 mg total) by mouth daily. 07/06/20   Nicholas Lose, MD  Biotin w/ Vitamins C & E (HAIR SKIN & NAILS GUMMIES PO) Take 1 tablet by mouth daily.     [provider]  carvedilol (COREG) 12.5 MG tablet Take 12.5 mg by mouth in the morning and at bedtime. 04/30/19   [provider]  glucose blood (ONETOUCH ULTRA) test strip 1 each by Other route 2 (two) times daily. E11.9 03/16/20   Jearld Fenton, NP  hydrALAZINE (APRESOLINE) 100 MG tablet Take 1  tablet (100 mg total) by mouth 3 (three) times daily. 01/09/20   Jearld Fenton, NP  irbesartan (AVAPRO) 300 MG tablet TAKE 1 TABLET BY MOUTH  DAILY 04/23/20   Jearld Fenton, NP  Lancets (ONETOUCH DELICA PLUS [id number]) [place] CHECK BLOOD SUGAR TWICE  DAILY 03/16/20   Jearld Fenton, NP  metFORMIN (GLUCOPHAGE) 1000 MG tablet Take 1 tablet by mouth in the morning and at bedtime. 12/18/19 12/17/20  [provider]  Multiple Vitamins-Minerals (ONE-A-DAY WOMENS VITACRAVES) CHEW Chew 1 tablet by mouth daily.     [provider]  pravastatin (PRAVACHOL) 40 MG tablet TAKE 1 TABLET BY MOUTH  DAILY 01/23/20   Jearld Fenton, NP    Allergies    Patient has no known allergies.  Review of Systems   Review of Systems  Constitutional:  Negative for chills and fever.  HENT:  Negative for ear pain and sore throat.   Eyes:  Negative for pain and visual disturbance.  Respiratory:  Negative for cough and shortness of breath.   Cardiovascular:  Negative for chest pain and palpitations.  Gastrointestinal:  Negative for abdominal pain, nausea and vomiting.  Genitourinary:  Negative for dysuria and hematuria.  Musculoskeletal:  Negative for arthralgias and back pain.  Skin:  Positive for rash. Negative for color change.  Neurological:  Negative for seizures and syncope.  All other systems reviewed and are negative.  Physical Exam Updated Vital Signs BP 132/78 (BP Location: Left Arm)   Pulse 80   Temp 98.4 F (36.9 C) (Oral)   Resp 18   Ht '5\' 5"'$  (1.651 m)   Wt 97.5 kg   SpO2 100%   BMI 35.78 kg/m   Physical Exam Vitals and nursing note reviewed.  HENT:     Head: Normocephalic and atraumatic.  Eyes:     General: No scleral icterus. Pulmonary:     Effort: Pulmonary effort is normal. No respiratory distress.  Musculoskeletal:     Cervical back: Normal range of motion.  Skin:    General: Skin is warm and dry.     Comments: Erythematous papular rash in a dermatomal distribution on  the right flank wrapping around to the right lower quadrant of the abdomen consistent with shingles.  Neurological:     General: No focal deficit present.     Mental Status: She is alert and oriented to person, place, and time.  Psychiatric:        Mood and Affect: Mood normal.    ED Results / Procedures / Treatments   Labs (all  labs ordered are listed, but only abnormal results are displayed) Labs Reviewed - No data to display  EKG None  Radiology No results found.  Procedures Procedures   Medications Ordered in ED Medications - No data to display  ED Course  I have reviewed the triage vital signs and the nursing notes.  Pertinent labs & imaging results that were available during my care of the patient were reviewed by me and considered in my medical decision making (see chart for details).    MDM Rules/Calculators/A&P                           [name] presented with a rash consistent with shingles.  She was given antiviral medication.  She has labs with her renal function from March of this year.  GFR was 54.  She was also given a few oxycodone to use at night only.  We talked about the purpose of and proper use of this medication as well as its risks and benefits.  She was given return precautions. Final Clinical Impression(s) / ED Diagnoses Final diagnoses:  Herpes zoster without complication    Rx / DC Orders ED Discharge Orders          Ordered    valACYclovir (VALTREX) 1000 MG tablet  3 times daily        08/08/20 0758    oxyCODONE (ROXICODONE) 5 MG immediate release tablet  At bedtime PRN        08/08/20 0758             Arnaldo Natal, MD 08/08/20 [phone]

## 2020-08-08 NOTE — ED Triage Notes (Signed)
Painful rash to right abdomen around to right flank x 4 days.

## 2020-08-10 ENCOUNTER — Telehealth: Payer: Self-pay

## 2020-08-10 ENCOUNTER — Encounter: Payer: Self-pay | Admitting: Internal Medicine

## 2020-08-10 NOTE — Telephone Encounter (Signed)
Please contact pt to schedule appt. Pt has not been seen since 8/21  Thank you,  Leamon Arnt

## 2020-08-11 MED ORDER — OXYCODONE HCL 5 MG PO TABS: 5.0000 mg | ORAL_TABLET | Freq: Three times a day (TID) | ORAL | 0 refills | Status: DC | PRN

## 2020-08-11 NOTE — Telephone Encounter (Signed)
Called pt to book, pt states she is seeing a new endocrinologist

## 2020-08-18 ENCOUNTER — Encounter: Payer: Self-pay | Admitting: Internal Medicine

## 2020-08-18 ENCOUNTER — Ambulatory Visit: Payer: Self-pay | Admitting: *Deleted

## 2020-08-18 MED ORDER — OXYCODONE HCL 5 MG PO TABS: 5.0000 mg | ORAL_TABLET | Freq: Three times a day (TID) | ORAL | 0 refills | Status: DC | PRN

## 2020-08-18 NOTE — Telephone Encounter (Signed)
The pt notified of your recommendation. She is requesting more pain medication to get her through until her appt scheduled with you on the 08/20/2020.

## 2020-08-18 NOTE — Telephone Encounter (Signed)
Pain medication has been refilled

## 2020-08-18 NOTE — Addendum Note (Signed)
Addended by: Jearld Fenton on: 08/18/2020 12:20 PM   Modules accepted: Orders

## 2020-08-18 NOTE — Telephone Encounter (Signed)
Advised her is she is in severe pain, she should be evaluated at [facility].

## 2020-08-18 NOTE — Telephone Encounter (Signed)
Pt was seen and diagnosed with shingles in the ED on 08/08/2020.  Given Valtrex and oxycodone for pain.   She is calling in today c/o worsening pain on her right side from her right mastectomy site all down her right side to her abd.   The rash extends from her right lower back around her side to her abd.   The rash is dried up but the pain has become worse.   10/10 on the pain scale.   She has CKD III "so I have to be careful what pain medications I can take".    "The pain medicine that was called in for me is not helping much".   "I'm hurting to bad in my right side".     She is also c/o "Tightness in her chest that feels like a band around my chest from the shingles pain, I think"  She is requesting to be seen by Webb Silversmith, NP today if at all possible.   I'm sending a note to [facility] high priority to see if she can be worked in today. I made her an appt with Regina for 08/20/2020 at 8:40 which was the next soonest appt available, in office visit.   "I'm in too much pain to wait that long" I let her know someone would be calling her back.   She thanked me for my help and was agreeable to this plan.

## 2020-08-18 NOTE — Telephone Encounter (Signed)
Reason for Disposition  Age > 60 years    Has Shingles on right side from back to stomach.  SEVERE pain (e.g., excruciating)    Diagnosed with shingles from right flank around her abd on 08/08/2020.   Seen in the ED and given Valtrex.   The rash is dried up but the pain has increased.  Answer Assessment - Initial Assessment Questions 1. LOCATION: "Where does it hurt?"      I had a mastectomy 4 yrs ago.    I have shingles now.   I went to the ED per Webb Silversmith, NP.    I have a rash that is dried up.   But the pain is worse.   It's from the mastectomy site down right side of my body.   My chest feels tight.   I'm using ice packs on it.   I'm still in a lot of pain.   2. RADIATION: "Does the pain shoot anywhere else?" (e.g., chest, back)     Pain from shingles down right side of abd from mastectomy site down to side of my abd/stomach.   It's the shingles pain.   3. ONSET: "When did the pain begin?" (e.g., minutes, hours or days ago)      Shingles diagnosed last Sat. Or Sat before last I can't remember.   4. SUDDEN: "Gradual or sudden onset?"     Not asked 5. PATTERN "Does the pain come and go, or is it constant?"    - If constant: "Is it getting better, staying the same, or worsening?"      (Note: Constant means the pain never goes away completely; most serious pain is constant and it progresses)     - If intermittent: "How long does it last?" "Do you have pain now?"     (Note: Intermittent means the pain goes away completely between bouts)     Constant  10 on pain scale. 6. SEVERITY: "How bad is the pain?"  (e.g., Scale 1-10; mild, moderate, or severe)   - MILD (1-3): doesn't interfere with normal activities, abdomen soft and not tender to touch    - MODERATE (4-7): interferes with normal activities or awakens from sleep, abdomen tender to touch    - SEVERE (8-10): excruciating pain, doubled over, unable to do any normal activities      10 on pain scale 7. RECURRENT SYMPTOM: "Have you ever  had this type of stomach pain before?" If Yes, ask: "When was the last time?" and "What happened that time?"      No it's from the Shingles.    I'm not supposed to take pain medicine due to kidney issues.   She called in an rx for pain meds every 8 hrs.   They help a little but not much. 8. CAUSE: "What do you think is causing the stomach pain?"     Shingles.   The pain is becoming more severe.    My chest started feeling tight a few days ago.   I feel like I have a big band around my chest.   No shortness of breath.    I have trouble get up and walking.   My husband helps me.   My right leg shakes.   After I get up my leg will stop shaking.   This has been happening for 3-4 days.   I guess it's from the pain, I don't really know.   9. RELIEVING/AGGRAVATING FACTORS: "What makes it better or worse?" (  e.g., movement, antacids, bowel movement)     The pain pills help a little but the pain is getting worse.   The Shingles started on my back and came around my side to my stomach.   It looks dried up now.   The pain is worse now. 10. OTHER SYMPTOMS: "Do you have any other symptoms?" (e.g., back pain, diarrhea, fever, urination pain, vomiting)       No fever 11. PREGNANCY: "Is there any chance you are pregnant?" "When was your last menstrual period?"       N/A due to age  Answer Assessment - Initial Assessment Questions 1. APPEARANCE of RASH: "Describe the rash."      She was diagnosed with Shingles in the ED 1-2 weeks ago.   She can't recall exactly. 2. LOCATION: "Where is the rash located?"      On right side from her mastectomy site all down her right side to her abd.   The rash is from her back and around her right side to her abd. 3. ONSET: "When did the rash start?"     1-2 weeks ago.   The rash is dried up now but it is more painful now. 4. ITCHING: "Does the rash itch?" If Yes, ask: "How bad is the itch?"  (Scale 1-10; or mild, moderate, severe)     No itching 5. PAIN: "Does the rash hurt?" If  Yes, ask: "How bad is the pain?"  (Scale 0-10; or none, mild, moderate, severe)    - NONE (0): no pain    - MILD (1-3): doesn't interfere with normal activities     - MODERATE (4-7): interferes with normal activities or awakens from sleep     - SEVERE (8-10): excruciating pain, unable to do any normal activities     Severe pain 10/10 on pain scale.   "The pain medicine is not really helping much.   She is also c/o her chest feeling tight like a band is around it from the pain. 6. OTHER SYMPTOMS: "Do you have any other symptoms?" (e.g., fever)     No   Just the severe shingles pain that seems to be getting worse and the chest tightness.    Denies shortness of breath. 7. PREGNANCY: "Is there any chance you are pregnant?" "When was your last menstrual period?"     N/A  Protocols used: Abdominal Pain - Female-A-AH, Shingles (Zoster)-A-AH

## 2020-08-18 NOTE — Telephone Encounter (Signed)
Oxycodone refilled.

## 2020-08-20 ENCOUNTER — Encounter: Payer: Self-pay | Admitting: Internal Medicine

## 2020-08-20 ENCOUNTER — Other Ambulatory Visit: Payer: Self-pay

## 2020-08-20 ENCOUNTER — Ambulatory Visit (INDEPENDENT_AMBULATORY_CARE_PROVIDER_SITE_OTHER): Payer: Medicare Other | Admitting: Internal Medicine

## 2020-08-20 VITALS — BP 141/78 | HR 72 | Temp 97.5°F | Resp 18 | Ht 65.0 in | Wt 210.8 lb

## 2020-08-20 DIAGNOSIS — B028 Zoster with other complications: Secondary | ICD-10-CM | POA: Diagnosis not present

## 2020-08-20 DIAGNOSIS — Z6835 Body mass index (BMI) 35.0-35.9, adult: Secondary | ICD-10-CM | POA: Diagnosis not present

## 2020-08-20 MED ORDER — GABAPENTIN 100 MG PO CAPS: 100.0000 mg | ORAL_CAPSULE | Freq: Three times a day (TID) | ORAL | 2 refills | Status: DC

## 2020-08-20 MED ORDER — PREDNISONE 10 MG PO TABS: ORAL_TABLET | ORAL | 0 refills | Status: DC

## 2020-08-20 NOTE — Progress Notes (Signed)
Subjective:    Patient ID: Carrie Singh, female    DOB: 1955/02/17, 65 y.o.   MRN: [id number]  HPI  Pt presents to the clinic today for ER follow up. She went to the ER 7/23 with c/o a rash. She was diagnosed with shingles, treated with Valacyclovir and Oxycodone. She was discharged and advised to follow up with her PCP.  Since discharge, she reports the rash seems to be drying up however the pain has been persistent.  She describes the pain as sharp and stabbing.  She reports the Oxycodone will help her sleep for 2 to 3 hours at a time, however she is still in significant pain.  Review of Systems     Past Medical History:  Diagnosis Date   Breast cancer ([place])    Breast cancer, right breast ([place]) 12/2007   a. s/p chemo, radiation, and lumpectomy with negative sentinel lymph node biopsy    Depression    Diabetes mellitus without complication ([facility])    GERD (gastroesophageal reflux disease)    Hyperlipidemia    Hypertension    resistant, negative renal Dopplers and negative CT angiogram   Migraines    Obesity    Seizures ([place])    last seizure  1 week ago: states she jumps for a second and it's gone    Current Outpatient Medications  Medication Sig Dispense Refill   anastrozole (ARIMIDEX) 1 MG tablet Take 1 tablet (1 mg total) by mouth daily. 90 tablet 3   Biotin w/ Vitamins C & E (HAIR SKIN & NAILS GUMMIES PO) Take 1 tablet by mouth daily.      carvedilol (COREG) 12.5 MG tablet Take 12.5 mg by mouth in the morning and at bedtime.     glucose blood (ONETOUCH ULTRA) test strip 1 each by Other route 2 (two) times daily. E11.9 200 strip 1   hydrALAZINE (APRESOLINE) 100 MG tablet Take 1 tablet (100 mg total) by mouth 3 (three) times daily. 270 tablet 3   irbesartan (AVAPRO) 300 MG tablet TAKE 1 TABLET BY MOUTH  DAILY 90 tablet 1   Lancets (ONETOUCH DELICA PLUS 123XX123) MISC CHECK BLOOD SUGAR TWICE  DAILY 200 each 3   metFORMIN (GLUCOPHAGE) 1000 MG tablet Take 1 tablet by mouth in  the morning and at bedtime.     Multiple Vitamins-Minerals (ONE-A-DAY WOMENS VITACRAVES) CHEW Chew 1 tablet by mouth daily.      oxyCODONE (ROXICODONE) 5 MG immediate release tablet Take 1 tablet (5 mg total) by mouth every 8 (eight) hours as needed for severe pain. 15 tablet 0   pravastatin (PRAVACHOL) 40 MG tablet TAKE 1 TABLET BY MOUTH  DAILY 90 tablet 2   valACYclovir (VALTREX) 1000 MG tablet Take 1 tablet (1,000 mg total) by mouth 3 (three) times daily. 21 tablet 0   No current facility-administered medications for this visit.    No Known Allergies  Family History  Problem Relation Age of Onset   Stroke Mother    Heart disease Father    Asthma Father    Cancer Father        colon   Cancer Sister        lung   Cancer Brother        colon   Diabetes Brother    Breast cancer Other 40    Social History   Socioeconomic History   Marital status: Married    Spouse name: [name]   Number of children: Not on file   Years  of education: 12+   Highest education level: Not on file  Occupational History   Occupation: [employer]  Tobacco Use   Smoking status: Never   Smokeless tobacco: Never  Vaping Use   Vaping Use: Never used  Substance and Sexual Activity   Alcohol use: No    Alcohol/week: 0.0 standard drinks   Drug use: No   Sexual activity: Yes  Other Topics Concern   Not on file  Social History Narrative   Lives at home with husband.   Caffeine use: 1-2 drinks per month         Epworth Sleepiness Scale = 15 (as of 01/01/15)   Social Determinants of Health   Financial Resource Strain: Not on file  Food Insecurity: Not on file  Transportation Needs: Not on file  Physical Activity: Not on file  Stress: Not on file  Social Connections: Not on file  Intimate Partner Violence: Not on file     Constitutional: Denies fever, malaise, fatigue, headache or abrupt weight changes.  Respiratory: Denies difficulty breathing, shortness of breath, cough or sputum production.    Cardiovascular: Denies chest pain, chest tightness, palpitations or swelling in the hands or feet.  Gastrointestinal: Denies abdominal pain, bloating, constipation, diarrhea or blood in the stool.  GU: Denies urgency, frequency, pain with urination, burning sensation, blood in urine, odor or discharge. Skin: Patient reports rash.  Denies ulcerations.    No other specific complaints in a complete review of systems (except as listed in HPI above).  Objective:   Physical Exam  BP (!) 141/78 (BP Location: Left Arm, Patient Position: Sitting, Cuff Size: Large)   Pulse 72   Temp (!) 97.5 F (36.4 C) (Temporal)   Resp 18   Ht '5\' 5"'$  (1.651 m)   Wt 210 lb 12.8 oz (95.6 kg)   SpO2 100%   BMI 35.08 kg/m   Wt Readings from Last 3 Encounters:  08/08/20 215 lb (97.5 kg)  07/06/20 213 lb 9.6 oz (96.9 kg)  02/26/20 216 lb (98 kg)    General: Appears her stated age, obese, in NAD. Skin: Grouped scabbed lesions on erythematous base noted in a scattered dermatomal pattern starting at the midline back extending around to the right side of the abdomen. Cardiovascular: Normal rate and rhythm.  Pulmonary/Chest: Normal effort and positive vesicular breath sounds.  Neurological: Alert and oriented.   BMET    Component Value Date/Time   NA 140 01/09/2020 0957   NA 146 (H) 01/20/2015 1457   NA 143 10/12/2013 1510   K 4.4 01/09/2020 0957   K 3.9 10/12/2013 1510   CL 104 01/09/2020 0957   CL 109 (H) 10/12/2013 1510   CO2 31 01/09/2020 0957   CO2 29 10/12/2013 1510   GLUCOSE 131 (H) 01/09/2020 0957   GLUCOSE 92 10/12/2013 1510   BUN 17 01/09/2020 0957   BUN 17 01/20/2015 1457   BUN 16 10/12/2013 1510   CREATININE 1.30 (H) 01/09/2020 0957   CREATININE 1.15 (H) 10/12/2015 1603   CALCIUM 10.1 01/09/2020 0957   CALCIUM 9.4 10/12/2013 1510   GFRNONAA >60 01/24/2018 2159   GFRNONAA 56 (L) 10/12/2013 1510   GFRAA >60 01/24/2018 2159   GFRAA >60 10/12/2013 1510    Lipid Panel     Component  Value Date/Time   CHOL 144 01/09/2020 0957   CHOL 179 10/17/2014 1527   TRIG 76.0 01/09/2020 0957   HDL 61.50 01/09/2020 0957   HDL 59 10/17/2014 1527   CHOLHDL 2  01/09/2020 0957   VLDL 15.2 01/09/2020 0957   LDLCALC 68 01/09/2020 0957   LDLCALC 91 10/17/2014 1527    CBC    Component Value Date/Time   WBC 3.8 (L) 01/09/2020 0957   RBC 4.54 01/09/2020 0957   HGB 13.1 01/09/2020 0957   HGB 13.5 10/12/2013 1510   HGB 12.8 08/13/2009 0946   HCT 39.5 01/09/2020 0957   HCT 40.5 10/12/2013 1510   HCT 36.8 08/13/2009 0946   PLT 243.0 01/09/2020 0957   PLT 278 10/12/2013 1510   PLT 271 08/13/2009 0946   MCV 86.9 01/09/2020 0957   MCV 87 10/12/2013 1510   MCV 87.0 08/13/2009 0946   MCH 28.7 01/24/2018 2159   MCHC 33.2 01/09/2020 0957   RDW 13.8 01/09/2020 0957   RDW 13.6 10/12/2013 1510   RDW 13.8 08/13/2009 0946   LYMPHSABS 2.1 01/24/2018 2159   LYMPHSABS 1.5 08/13/2009 0946   MONOABS 0.3 01/24/2018 2159   MONOABS 0.2 08/13/2009 0946   EOSABS 0.1 01/24/2018 2159   EOSABS 0.1 08/13/2009 0946   BASOSABS 0.1 01/24/2018 2159   BASOSABS 0.0 08/13/2009 0946    Hgb A1C Lab Results  Component Value Date   HGBA1C 6.8 (H) 01/09/2020          Assessment & Plan:   ER follow-up for Herpes Zoster:  Concern for postherpetic neuralgia Rx for Pred taper x9-day Rx for Gabapentin 100 mg 3 times daily Will avoid refill of narcotic at this time  Advised her to update me in 10 days and let me know how she is doing

## 2020-08-20 NOTE — Patient Instructions (Signed)
Postherpetic Neuralgia Postherpetic neuralgia (PHN) is nerve pain that occurs after a shingles infection. Shingles is a painful rash that appears on one area of the body, usually on the trunk or face. Shingles is caused by the varicella-zoster virus. This is the same virus that causes chickenpox. In people who have hadchickenpox, the virus can resurface years later and cause shingles. PHN appears in the same area where you had the shingles rash. The pain usually goes away after the rash disappears. You may have PHN if you continue to havepain 3 months after your shingles rash has gone away. What are the causes? This condition is caused by damage to your nerves due to inflammation from thevaricella-zoster virus. The damage makes your nerves overly sensitive. What increases the risk? The following factors may make you more likely to develop this condition: Being older than 67 years of age. Having severe pain before your shingles rash starts. Having a severe rash. Having shingles in and around the eye area. Having a disease or taking medicine that causes you to have a weakened disease-fighting system (immune system). What are the signs or symptoms? The main symptom of this condition is pain. The pain may: Often be severe and may be described as stabbing, burning, shooting, or feeling like an electric shock. Come and go, or it may be there all the time. Be triggered by light touches on the skin or changes in temperature. You may have itching along with the pain. How is this diagnosed? This condition may be diagnosed based on your symptoms and your history ofshingles. Lab studies and other diagnostic tests are usually not needed. How is this treated? There is no cure for this condition. Treatment for PHN will focus on pain relief. Over-the-counter pain relievers do not usually relieve PHN pain. You may need to work with a pain specialist. Treatment may include: Anti-seizure medicines to relieve  nerve pain. Antidepressant medicines to help with pain and improve sleep. A numbing patch worn on the skin (lidocaine patch). Strong pain relievers (opioids). Injections of numbing medicine or anti-inflammatory medicines around irritated nerves. Injections of botulinum toxin to block pain signals between nerves and muscles. Follow these instructions at home: Medicines Take over-the-counter and prescription medicines only as told by your health care provider. Ask your health care provider if the medicine prescribed to you: Requires you to avoid driving or using machinery. Can cause constipation. You may need to take these actions to prevent or treat constipation: Drink enough fluid to keep your urine pale yellow. Take over-the-counter or prescription medicines. Eat foods that are high in fiber, such as beans, whole grains, and fresh fruits and vegetables. Limit foods that are high in fat and processed sugars, such as fried or sweet foods. Managing pain  If directed, put ice on the painful area. To do this: Put ice in a plastic bag. Place a towel between your skin and the bag. Leave the ice on for 20 minutes, 2-3 times a day. Remove the ice if your skin turns bright red. This is very important. If you cannot feel pain, heat, or cold, you have a greater risk of damage to the area. Cover sensitive areas with a bandage (dressing) to reduce friction from clothing rubbing on the area.  General instructions It may take a long time to recover from PHN. Work closely with your health care provider and develop a good support system at home. You may consider joining a support group. Wear loose, comfortable clothing. Talk to your health   care provider if you feel depressed or desperate. Living with long-term pain can be depressing. Keep all follow-up visits. This is important. How is this prevented? Getting a vaccination for shingles can prevent PHN. The shingles vaccine is recommended for people  older than age 50. It may prevent shingles and may alsolower your risk of PHN if you do get shingles. Contact a health care provider if: Your medicine is not helping. You are struggling to manage your pain at home. Get help right away if: You have thoughts about hurting yourself or others. If you ever feel like you may hurt yourself or others, or have thoughts about taking your own life, get help right away. Go to your nearest emergency department or: Call your local emergency services (911 in the [place] Call a suicide crisis helpline, such as the National Suicide Prevention Lifeline at 1-800-273-8255. This is open 24 hours a day in the [place] Text the Crisis Text Line at 741741 (in the [place] Summary Postherpetic neuralgia (PHN) is a very painful disorder that can occur after an episode of shingles. The pain is often severe and may be described as stabbing, burning, shooting, or feeling like an electric shock. Prescription medicines can be helpful in managing persistent pain. Getting a vaccination for shingles can prevent PHN. This vaccine is recommended for people older than age 50. This information is not intended to replace advice given to you by your health care provider. Make sure you discuss any questions you have with your healthcare provider. Document Revised: 12/30/2019 Document Reviewed: 12/30/2019 Elsevier Patient Education  2022 Elsevier Inc.  

## 2020-08-21 ENCOUNTER — Encounter: Payer: Self-pay | Admitting: Internal Medicine

## 2020-08-21 DIAGNOSIS — Z6834 Body mass index (BMI) 34.0-34.9, adult: Secondary | ICD-10-CM | POA: Insufficient documentation

## 2020-08-21 DIAGNOSIS — E6609 Other obesity due to excess calories: Secondary | ICD-10-CM | POA: Insufficient documentation

## 2020-08-21 NOTE — Addendum Note (Signed)
Addended by: Jearld Fenton on: 08/21/2020 06:16 AM   Modules accepted: Level of Service

## 2020-08-21 NOTE — Assessment & Plan Note (Signed)
Encourage diet and exercise for weight loss 

## 2020-08-28 ENCOUNTER — Encounter: Payer: Self-pay | Admitting: Internal Medicine

## 2020-08-31 ENCOUNTER — Telehealth: Payer: Self-pay

## 2020-08-31 NOTE — Telephone Encounter (Signed)
Please contact pt to schedule an OV before papers from Medicare will be filled out per Loanne Drilling.

## 2020-09-03 ENCOUNTER — Other Ambulatory Visit: Payer: Self-pay | Admitting: Internal Medicine

## 2020-09-23 NOTE — Telephone Encounter (Signed)
Called Patient to schedule appointment-Patient states she has a new Endocrinologist in [place] and will not be returning to get care from Dr. Loanne Drilling.

## 2020-09-30 ENCOUNTER — Other Ambulatory Visit: Payer: Self-pay | Admitting: Internal Medicine

## 2020-10-06 ENCOUNTER — Other Ambulatory Visit: Payer: Self-pay | Admitting: Internal Medicine

## 2020-10-06 NOTE — Telephone Encounter (Signed)
Requested medications are due for refill today.  yes  Requested medications are on the active medications list.  yes  Last refill. 04/23/2020  Future visit scheduled.   yes  Notes to clinic.  Labs are expired. Patient has not been seen for HTN since 01/09/2020

## 2020-10-21 ENCOUNTER — Other Ambulatory Visit: Payer: Self-pay | Admitting: Internal Medicine

## 2020-11-16 ENCOUNTER — Other Ambulatory Visit: Payer: Self-pay

## 2020-11-16 ENCOUNTER — Ambulatory Visit (INDEPENDENT_AMBULATORY_CARE_PROVIDER_SITE_OTHER): Payer: Medicare Other

## 2020-11-16 DIAGNOSIS — Z23 Encounter for immunization: Secondary | ICD-10-CM

## 2020-11-30 ENCOUNTER — Ambulatory Visit: Payer: Medicare Other | Admitting: Podiatry

## 2020-11-30 ENCOUNTER — Other Ambulatory Visit: Payer: Self-pay

## 2020-11-30 ENCOUNTER — Encounter: Payer: Self-pay | Admitting: Podiatry

## 2020-11-30 DIAGNOSIS — M21619 Bunion of unspecified foot: Secondary | ICD-10-CM | POA: Diagnosis not present

## 2020-11-30 DIAGNOSIS — E139 Other specified diabetes mellitus without complications: Secondary | ICD-10-CM

## 2020-11-30 NOTE — Progress Notes (Signed)
Subjective:   Patient ID: Carrie Singh, female   DOB: 65 y.o.   MRN: [id number]   HPI Patient presents diabetic foot exam still having structural bunion and hammertoe deformity left and just wants her feet checked currently   ROS      Objective:  Physical Exam  Neurovascular status intact with moderate structural bunion deformity left well corrected right excellent range of motion first MPJ right no crepitus of the joint with patient having low great discomfort but more concerned about long-term diabetes that is under excellent control.  I did not note any circulatory or neurological deficit     Assessment:  Is stable does have structural deformity left no indications of pathology right first MPJ     Plan:  H&P reviewed condition discussed diabetic foot care daily inspections but do not think she needs yearly visits if she keeps her sugar under such great control.  Patient will be seen back to recheck may require bunion correction left at 1 point in future

## 2020-12-07 LAB — HM DIABETES EYE EXAM

## 2020-12-24 ENCOUNTER — Other Ambulatory Visit: Payer: Self-pay | Admitting: Internal Medicine

## 2020-12-24 NOTE — Telephone Encounter (Signed)
Requested Prescriptions  Pending Prescriptions Disp Refills  . pravastatin (PRAVACHOL) 40 MG tablet [Pharmacy Med Name: Pravastatin Sodium 40 MG Oral Tablet] 90 tablet 3    Sig: TAKE 1 TABLET BY MOUTH  DAILY     Cardiovascular:  Antilipid - Statins Passed - 12/24/2020  4:42 AM      Passed - Total Cholesterol in normal range and within 360 days    Cholesterol, Total  Date Value Ref Range Status  10/17/2014 179 100 - 199 mg/dL Final   Cholesterol  Date Value Ref Range Status  01/09/2020 144 0 - 200 mg/dL Final    Comment:    ATP III Classification       Desirable:  < 200 mg/dL               Borderline High:  200 - 239 mg/dL          High:  > = 240 mg/dL         Passed - LDL in normal range and within 360 days    LDL Calculated  Date Value Ref Range Status  10/17/2014 91 0 - 99 mg/dL Final   LDL Cholesterol  Date Value Ref Range Status  01/09/2020 68 0 - 99 mg/dL Final   Direct LDL  Date Value Ref Range Status  09/05/2013 169.6 mg/dL Final    Comment:    Optimal:  <100 mg/dLNear or Above Optimal:  100-129 mg/dLBorderline High:  130-159 mg/dLHigh:  160-189 mg/dLVery High:  >190 mg/dL         Passed - HDL in normal range and within 360 days    HDL  Date Value Ref Range Status  01/09/2020 61.50 >39.00 mg/dL Final  10/17/2014 59 >39 mg/dL Final    Comment:    According to ATP-III Guidelines, HDL-C >59 mg/dL is considered a negative risk factor for CHD.          Passed - Triglycerides in normal range and within 360 days    Triglycerides  Date Value Ref Range Status  01/09/2020 76.0 0.0 - 149.0 mg/dL Final    Comment:    Normal:  <150 mg/dLBorderline High:  150 - 199 mg/dL         Passed - Patient is not pregnant      Passed - Valid encounter within last 12 months    Recent Outpatient Visits          4 months ago Herpes zoster with other complication   [facility] [place], Coralie Keens, NP      Future Appointments            In 2 weeks Garnette Gunner, Coralie Keens,  NP [facility], [facility]

## 2020-12-28 ENCOUNTER — Other Ambulatory Visit: Payer: Self-pay | Admitting: Hematology and Oncology

## 2020-12-28 DIAGNOSIS — N959 Unspecified menopausal and perimenopausal disorder: Secondary | ICD-10-CM

## 2020-12-28 DIAGNOSIS — E2839 Other primary ovarian failure: Secondary | ICD-10-CM

## 2020-12-31 ENCOUNTER — Ambulatory Visit
Admission: RE | Admit: 2020-12-31 | Discharge: 2020-12-31 | Disposition: A | Discharge status: Home / Self Care | Payer: Medicare Other | Source: Ambulatory Visit | Attending: Adult Health | Admitting: Adult Health

## 2020-12-31 ENCOUNTER — Other Ambulatory Visit: Payer: Self-pay | Admitting: Adult Health

## 2020-12-31 ENCOUNTER — Encounter: Payer: Self-pay | Admitting: Hematology and Oncology

## 2020-12-31 ENCOUNTER — Ambulatory Visit
Admission: RE | Admit: 2020-12-31 | Discharge: 2020-12-31 | Disposition: A | Discharge status: Home / Self Care | Payer: Medicare Other | Source: Ambulatory Visit | Attending: Hematology and Oncology | Admitting: Hematology and Oncology

## 2020-12-31 DIAGNOSIS — N959 Unspecified menopausal and perimenopausal disorder: Secondary | ICD-10-CM

## 2020-12-31 DIAGNOSIS — Z1231 Encounter for screening mammogram for malignant neoplasm of breast: Secondary | ICD-10-CM

## 2020-12-31 DIAGNOSIS — E2839 Other primary ovarian failure: Secondary | ICD-10-CM

## 2021-01-04 ENCOUNTER — Other Ambulatory Visit: Payer: Self-pay | Admitting: Adult Health

## 2021-01-04 DIAGNOSIS — R928 Other abnormal and inconclusive findings on diagnostic imaging of breast: Secondary | ICD-10-CM

## 2021-01-12 ENCOUNTER — Ambulatory Visit (INDEPENDENT_AMBULATORY_CARE_PROVIDER_SITE_OTHER): Payer: Medicare Other | Admitting: Internal Medicine

## 2021-01-12 ENCOUNTER — Other Ambulatory Visit: Payer: Self-pay

## 2021-01-12 ENCOUNTER — Ambulatory Visit
Admission: RE | Admit: 2021-01-12 | Discharge: 2021-01-12 | Disposition: A | Discharge status: Home / Self Care | Payer: Medicare Other | Attending: Internal Medicine | Admitting: Internal Medicine

## 2021-01-12 ENCOUNTER — Ambulatory Visit
Admission: RE | Admit: 2021-01-12 | Discharge: 2021-01-12 | Disposition: A | Discharge status: Home / Self Care | Payer: Medicare Other | Source: Ambulatory Visit | Attending: Internal Medicine | Admitting: Internal Medicine

## 2021-01-12 ENCOUNTER — Encounter: Payer: Self-pay | Admitting: Internal Medicine

## 2021-01-12 VITALS — BP 138/82 | HR 87 | Temp 98.2°F | Resp 18 | Ht 65.0 in | Wt 209.4 lb

## 2021-01-12 DIAGNOSIS — Z1211 Encounter for screening for malignant neoplasm of colon: Secondary | ICD-10-CM

## 2021-01-12 DIAGNOSIS — Z0001 Encounter for general adult medical examination with abnormal findings: Secondary | ICD-10-CM

## 2021-01-12 DIAGNOSIS — M79671 Pain in right foot: Secondary | ICD-10-CM

## 2021-01-12 DIAGNOSIS — E66811 Other obesity due to excess calories: Secondary | ICD-10-CM

## 2021-01-12 DIAGNOSIS — E6609 Other obesity due to excess calories: Secondary | ICD-10-CM

## 2021-01-12 DIAGNOSIS — E1165 Type 2 diabetes mellitus with hyperglycemia: Secondary | ICD-10-CM | POA: Diagnosis not present

## 2021-01-12 DIAGNOSIS — Z6834 Body mass index (BMI) 34.0-34.9, adult: Secondary | ICD-10-CM

## 2021-01-12 NOTE — Patient Instructions (Signed)
Health Maintenance for Postmenopausal Women ?Menopause is a normal process in which your ability to get pregnant comes to an end. This process happens slowly over many months or years, usually between the ages of 48 and 55. Menopause is complete when you have missed your menstrual period for 12 months. ?It is important to talk with your health care provider about some of the most common conditions that affect women after menopause (postmenopausal women). These include heart disease, cancer, and bone loss (osteoporosis). Adopting a healthy lifestyle and getting preventive care can help to promote your health and wellness. The actions you take can also lower your chances of developing some of these common conditions. ?What are the signs and symptoms of menopause? ?During menopause, you may have the following symptoms: ?Hot flashes. These can be moderate or severe. ?Night sweats. ?Decrease in sex drive. ?Mood swings. ?Headaches. ?Tiredness (fatigue). ?Irritability. ?Memory problems. ?Problems falling asleep or staying asleep. ?Talk with your health care provider about treatment options for your symptoms. ?Do I need hormone replacement therapy? ?Hormone replacement therapy is effective in treating symptoms that are caused by menopause, such as hot flashes and night sweats. ?Hormone replacement carries certain risks, especially as you become older. If you are thinking about using estrogen or estrogen with progestin, discuss the benefits and risks with your health care provider. ?How can I reduce my risk for heart disease and stroke? ?The risk of heart disease, heart attack, and stroke increases as you age. One of the causes may be a change in the body's hormones during menopause. This can affect how your body uses dietary fats, triglycerides, and cholesterol. Heart attack and stroke are medical emergencies. There are many things that you can do to help prevent heart disease and stroke. ?Watch your blood pressure ?High  blood pressure causes heart disease and increases the risk of stroke. This is more likely to develop in people who have high blood pressure readings or are overweight. ?Have your blood pressure checked: ?Every 3-5 years if you are 18-39 years of age. ?Every year if you are 40 years old or older. ?Eat a healthy diet ? ?Eat a diet that includes plenty of vegetables, fruits, low-fat dairy products, and lean protein. ?Do not eat a lot of foods that are high in solid fats, added sugars, or sodium. ?Get regular exercise ?Get regular exercise. This is one of the most important things you can do for your health. Most adults should: ?Try to exercise for at least 150 minutes each week. The exercise should increase your heart rate and make you sweat (moderate-intensity exercise). ?Try to do strengthening exercises at least twice each week. Do these in addition to the moderate-intensity exercise. ?Spend less time sitting. Even light physical activity can be beneficial. ?Other tips ?Work with your health care provider to achieve or maintain a healthy weight. ?Do not use any products that contain nicotine or tobacco. These products include cigarettes, chewing tobacco, and vaping devices, such as e-cigarettes. If you need help quitting, ask your health care provider. ?Know your numbers. Ask your health care provider to check your cholesterol and your blood sugar (glucose). Continue to have your blood tested as directed by your health care provider. ?Do I need screening for cancer? ?Depending on your health history and family history, you may need to have cancer screenings at different stages of your life. This may include screening for: ?Breast cancer. ?Cervical cancer. ?Lung cancer. ?Colorectal cancer. ?What is my risk for osteoporosis? ?After menopause, you may be   at increased risk for osteoporosis. Osteoporosis is a condition in which bone destruction happens more quickly than new bone creation. To help prevent osteoporosis or  the bone fractures that can happen because of osteoporosis, you may take the following actions: ?If you are 19-50 years old, get at least 1,000 mg of calcium and at least 600 international units (IU) of vitamin D per day. ?If you are older than age 50 but younger than age 70, get at least 1,200 mg of calcium and at least 600 international units (IU) of vitamin D per day. ?If you are older than age 70, get at least 1,200 mg of calcium and at least 800 international units (IU) of vitamin D per day. ?Smoking and drinking excessive alcohol increase the risk of osteoporosis. Eat foods that are rich in calcium and vitamin D, and do weight-bearing exercises several times each week as directed by your health care provider. ?How does menopause affect my mental health? ?Depression may occur at any age, but it is more common as you become older. Common symptoms of depression include: ?Feeling depressed. ?Changes in sleep patterns. ?Changes in appetite or eating patterns. ?Feeling an overall lack of motivation or enjoyment of activities that you previously enjoyed. ?Frequent crying spells. ?Talk with your health care provider if you think that you are experiencing any of these symptoms. ?General instructions ?See your health care provider for regular wellness exams and vaccines. This may include: ?Scheduling regular health, dental, and eye exams. ?Getting and maintaining your vaccines. These include: ?Influenza vaccine. Get this vaccine each year before the flu season begins. ?Pneumonia vaccine. ?Shingles vaccine. ?Tetanus, diphtheria, and pertussis (Tdap) booster vaccine. ?Your health care provider may also recommend other immunizations. ?Tell your health care provider if you have ever been abused or do not feel safe at home. ?Summary ?Menopause is a normal process in which your ability to get pregnant comes to an end. ?This condition causes hot flashes, night sweats, decreased interest in sex, mood swings, headaches, or lack  of sleep. ?Treatment for this condition may include hormone replacement therapy. ?Take actions to keep yourself healthy, including exercising regularly, eating a healthy diet, watching your weight, and checking your blood pressure and blood sugar levels. ?Get screened for cancer and depression. Make sure that you are up to date with all your vaccines. ?This information is not intended to replace advice given to you by your health care provider. Make sure you discuss any questions you have with your health care provider. ?Document Revised: 05/25/2020 Document Reviewed: 05/25/2020 ?Elsevier Patient Education ? 2022 [employer]vier Inc[employer] ? ?

## 2021-01-12 NOTE — Assessment & Plan Note (Signed)
Encourage diet and exercise for weight loss 

## 2021-01-12 NOTE — Progress Notes (Signed)
Subjective:    Patient ID: Carrie Singh, female    DOB: [birth year], 65 y.o.   MRN: [id number]  HPI  Pt presents to the clinic today for her annual exam.  She does report chronic right foot pain.  She reports this started about 6 months ago.  She describes the pain as sore and achy.  The pain can interfere with her ability to ambulate at times.  She denies any injury to the area.  Flu: 10/2020 Tetanus: 12/2008 Covid: Pfizer x 3 Pneumovax: 12/2019 Prevnar: 11/2018 Shingrix: never Pap smear: hysterectomy Mammogram: 12/2020 Bone density: 12/2020 Colon screening: 01/2011 Vision screening: annually Dentist: biannually  Diet: She does eat meat. She consumes fruits and veggies. She does eat some fried foods. She drinks mostly water, some Ginger Ale Exercise: Silver sneakers   Review of Systems     Past Medical History:  Diagnosis Date   Breast cancer ([place])    Breast cancer, right breast ([place]) 12/2007   a. s/p chemo, radiation, and lumpectomy with negative sentinel lymph node biopsy    Depression    Diabetes mellitus without complication ([facility])    GERD (gastroesophageal reflux disease)    Hyperlipidemia    Hypertension    resistant, negative renal Dopplers and negative CT angiogram   Migraines    Obesity    Seizures ([place])    last seizure  1 week ago: states she jumps for a second and it's gone    Current Outpatient Medications  Medication Sig Dispense Refill   amLODipine (NORVASC) 2.5 MG tablet Take by mouth.     anastrozole (ARIMIDEX) 1 MG tablet Take 1 tablet (1 mg total) by mouth daily. 90 tablet 3   carvedilol (COREG) 12.5 MG tablet Take 12.5 mg by mouth in the morning and at bedtime.     gabapentin (NEURONTIN) 100 MG capsule Take 1 capsule (100 mg total) by mouth 3 (three) times daily. 30 capsule 2   hydrALAZINE (APRESOLINE) 100 MG tablet TAKE 1 TABLET BY MOUTH 3  TIMES DAILY 270 tablet 0   irbesartan (AVAPRO) 300 MG tablet TAKE 1 TABLET BY MOUTH  DAILY 90 tablet 1    Lancets (ONETOUCH DELICA PLUS PHXTAV69V) MISC CHECK BLOOD SUGAR TWICE  DAILY 200 each 3   Multiple Vitamins-Minerals (ONE-A-DAY WOMENS VITACRAVES) CHEW Chew 1 tablet by mouth daily.      ONETOUCH ULTRA test strip USE TWICE DAILY AS DIRECTED 200 strip 3   oxyCODONE (ROXICODONE) 5 MG immediate release tablet Take 1 tablet (5 mg total) by mouth every 8 (eight) hours as needed for severe pain. 15 tablet 0   pravastatin (PRAVACHOL) 40 MG tablet TAKE 1 TABLET BY MOUTH  DAILY 90 tablet 0   predniSONE (DELTASONE) 10 MG tablet Take 3 tabs on days 1-3, 2 tabs on days 4-6, 1 tab on days 7-9 18 tablet 0   repaglinide (PRANDIN) 0.5 MG tablet Take 0.5 mg by mouth 3 (three) times daily before meals.     No current facility-administered medications for this visit.    No Known Allergies  Family History  Problem Relation Age of Onset   Stroke Mother    Heart disease Father    Asthma Father    Cancer Father        colon   Cancer Sister        lung   Cancer Brother        colon   Diabetes Brother    Breast cancer Other 21  Social History   Socioeconomic History   Marital status: Married    Spouse name: [name]   Number of children: Not on file   Years of education: 12+   Highest education level: Not on file  Occupational History   Occupation: [employer]  Tobacco Use   Smoking status: Never   Smokeless tobacco: Never  Vaping Use   Vaping Use: Never used  Substance and Sexual Activity   Alcohol use: No    Alcohol/week: 0.0 standard drinks   Drug use: No   Sexual activity: Yes  Other Topics Concern   Not on file  Social History Narrative   Lives at home with husband.   Caffeine use: 1-2 drinks per month         Epworth Sleepiness Scale = 15 (as of 01/01/15)   Social Determinants of Health   Financial Resource Strain: Not on file  Food Insecurity: Not on file  Transportation Needs: Not on file  Physical Activity: Not on file  Stress: Not on file  Social Connections: Not on file   Intimate Partner Violence: Not on file     Constitutional: Pt reports intermittent headaches. Denies fever, malaise, fatigue, or abrupt weight changes.  HEENT: Denies eye pain, eye redness, ear pain, ringing in the ears, wax buildup, runny nose, nasal congestion, bloody nose, or sore throat. Respiratory: Denies difficulty breathing, shortness of breath, cough or sputum production.   Cardiovascular: Denies chest pain, chest tightness, palpitations or swelling in the hands or feet.  Gastrointestinal: Denies abdominal pain, bloating, constipation, diarrhea or blood in the stool.  GU: Denies urgency, frequency, pain with urination, burning sensation, blood in urine, odor or discharge. Musculoskeletal: Pt reports chronic right foot pain. Denies decrease in range of motion, difficulty with gait, muscle pain or joint swelling.  Skin: Denies redness, rashes, lesions or ulcercations.  Neurological: Patient reports difficulty with memory.  Denies dizziness, difficulty with speech or problems with balance and coordination.  Psych: Pt has a history of depression. Denies anxiety, SI/HI.  No other specific complaints in a complete review of systems (except as listed in HPI above).  Objective:   Physical Exam Pulse 87    Temp 98.2 F (36.8 C) (Temporal)    Resp 18    Ht 5\' 5"  (1.651 m)    Wt 209 lb 6.4 oz (95 kg)    SpO2 99%    BMI 34.85 kg/m   Wt Readings from Last 3 Encounters:  08/20/20 210 lb 12.8 oz (95.6 kg)  08/08/20 215 lb (97.5 kg)  07/06/20 213 lb 9.6 oz (96.9 kg)    General: Appears her stated age, obese, in NAD. Skin: Warm, dry and intact. No ulcerations noted. HEENT: Head: normal shape and size; Eyes: sclera white and EOMs intact;  Neck:  Neck supple, trachea midline. No masses, lumps or thyromegaly present.  Cardiovascular: Normal rate and rhythm. S1,S2 noted.  No murmur, rubs or gallops noted. No JVD or BLE edema. No carotid bruits noted. Pulmonary/Chest: Normal effort and  positive vesicular breath sounds. No respiratory distress. No wheezes, rales or ronchi noted.  Abdomen: Soft and nontender. Normal bowel sounds. No distention or masses noted. Liver, spleen and kidneys non palpable. Musculoskeletal: Strength 5/5 BUE/BLE. No difficulty with gait.  Neurological: Alert and oriented. Cranial nerves II-XII grossly intact. Coordination normal.  Psychiatric: Mood and affect mildly flat. Behavior is normal. Judgment and thought content normal.     BMET    Component Value Date/Time   NA 140 01/09/2020  0957   NA 146 (H) 01/20/2015 1457   NA 143 10/12/2013 1510   K 4.4 01/09/2020 0957   K 3.9 10/12/2013 1510   CL 104 01/09/2020 0957   CL 109 (H) 10/12/2013 1510   CO2 31 01/09/2020 0957   CO2 29 10/12/2013 1510   GLUCOSE 131 (H) 01/09/2020 0957   GLUCOSE 92 10/12/2013 1510   BUN 17 01/09/2020 0957   BUN 17 01/20/2015 1457   BUN 16 10/12/2013 1510   CREATININE 1.30 (H) 01/09/2020 0957   CREATININE 1.15 (H) 10/12/2015 1603   CALCIUM 10.1 01/09/2020 0957   CALCIUM 9.4 10/12/2013 1510   GFRNONAA >60 01/24/2018 2159   GFRNONAA 56 (L) 10/12/2013 1510   GFRAA >60 01/24/2018 2159   GFRAA >60 10/12/2013 1510    Lipid Panel     Component Value Date/Time   CHOL 144 01/09/2020 0957   CHOL 179 10/17/2014 1527   TRIG 76.0 01/09/2020 0957   HDL 61.50 01/09/2020 0957   HDL 59 10/17/2014 1527   CHOLHDL 2 01/09/2020 0957   VLDL 15.2 01/09/2020 0957   LDLCALC 68 01/09/2020 0957   LDLCALC 91 10/17/2014 1527    CBC    Component Value Date/Time   WBC 3.8 (L) 01/09/2020 0957   RBC 4.54 01/09/2020 0957   HGB 13.1 01/09/2020 0957   HGB 13.5 10/12/2013 1510   HGB 12.8 08/13/2009 0946   HCT 39.5 01/09/2020 0957   HCT 40.5 10/12/2013 1510   HCT 36.8 08/13/2009 0946   PLT 243.0 01/09/2020 0957   PLT 278 10/12/2013 1510   PLT 271 08/13/2009 0946   MCV 86.9 01/09/2020 0957   MCV 87 10/12/2013 1510   MCV 87.0 08/13/2009 0946   MCH 28.7 01/24/2018 2159   MCHC  33.2 01/09/2020 0957   RDW 13.8 01/09/2020 0957   RDW 13.6 10/12/2013 1510   RDW 13.8 08/13/2009 0946   LYMPHSABS 2.1 01/24/2018 2159   LYMPHSABS 1.5 08/13/2009 0946   MONOABS 0.3 01/24/2018 2159   MONOABS 0.2 08/13/2009 0946   EOSABS 0.1 01/24/2018 2159   EOSABS 0.1 08/13/2009 0946   BASOSABS 0.1 01/24/2018 2159   BASOSABS 0.0 08/13/2009 0946    Hgb A1C Lab Results  Component Value Date   HGBA1C 6.8 (H) 01/09/2020           Assessment & Plan:   Preventative Health Maintneance:  Flu shot UTD She declines tetanus for insurance reason's. Advised her if she gets bit or cut she will need to get this done Encouraged her to get her covid booster Discussed Shingrix, she will get this at the pharmacy if insurance covers She no longer needs pap smears Mammogram UTD Bone density UTD Colon  screening due, referral to GI placed Encouraged her to consume a balanced diet and exercise regimen Advised her to see an eye doctor and dentist annually Will check CBC, CMET, Lipid, A1C, urine microalbumin today  Chronic Right Foot Pain:  X-ray right foot today  RTC in 6 months, follow up chronic conditions  Webb Silversmith, NP This visit occurred during the SARS-CoV-2 public health emergency.  Safety protocols were in place, including screening questions prior to the visit, additional usage of staff PPE, and extensive cleaning of exam room while observing appropriate contact time as indicated for disinfecting solutions.

## 2021-01-13 LAB — COMPLETE METABOLIC PANEL WITH GFR
AG Ratio: 1.9 (calc) (ref 1.0–2.5)
ALT: 11 U/L (ref 6–29)
AST: 12 U/L (ref 10–35)
Albumin: 4.3 g/dL (ref 3.6–5.1)
Alkaline phosphatase (APISO): 52 U/L (ref 37–153)
BUN/Creatinine Ratio: 15 (calc) (ref 6–22)
BUN: 20 mg/dL (ref 7–25)
CO2: 33 mmol/L — ABNORMAL HIGH (ref 20–32)
Calcium: 9.9 mg/dL (ref 8.6–10.4)
Chloride: 104 mmol/L (ref 98–110)
Creat: 1.31 mg/dL — ABNORMAL HIGH (ref 0.50–1.05)
Globulin: 2.3 g/dL (calc) (ref 1.9–3.7)
Glucose, Bld: 178 mg/dL — ABNORMAL HIGH (ref 65–139)
Potassium: 4.2 mmol/L (ref 3.5–5.3)
Sodium: 143 mmol/L (ref 135–146)
Total Bilirubin: 0.9 mg/dL (ref 0.2–1.2)
Total Protein: 6.6 g/dL (ref 6.1–8.1)
eGFR: 45 mL/min/{1.73_m2} — ABNORMAL LOW (ref >=60)

## 2021-01-13 LAB — MICROALBUMIN / CREATININE URINE RATIO
Creatinine, Urine: 423 mg/dL — ABNORMAL HIGH (ref 20–275)
Microalb Creat Ratio: 9 mcg/mg creat (ref <30)
Microalb, Ur: 3.6 mg/dL

## 2021-01-13 LAB — CBC
HCT: 38.4 % (ref 35.0–45.0)
Hemoglobin: 12.5 g/dL (ref 11.7–15.5)
MCH: 29.9 pg (ref 27.0–33.0)
MCHC: 32.6 g/dL (ref 32.0–36.0)
MCV: 91.9 fL (ref 80.0–100.0)
MPV: 9.9 fL (ref 7.5–12.5)
Platelets: 250 10*3/uL (ref 140–400)
RBC: 4.18 10*6/uL (ref 3.80–5.10)
RDW: 12.4 % (ref 11.0–15.0)
WBC: 3.9 10*3/uL (ref 3.8–10.8)

## 2021-01-13 LAB — HEMOGLOBIN A1C
Hgb A1c MFr Bld: 5.6 % of total Hgb (ref <5.7)
Mean Plasma Glucose: 114 mg/dL
eAG (mmol/L): 6.3 mmol/L

## 2021-01-13 LAB — LIPID PANEL
Cholesterol: 173 mg/dL (ref <200)
HDL: 62 mg/dL (ref >=50)
LDL Cholesterol (Calc): 86 mg/dL (calc)
Non-HDL Cholesterol (Calc): 111 mg/dL (calc) (ref <130)
Total CHOL/HDL Ratio: 2.8 (calc) (ref <5.0)
Triglycerides: 148 mg/dL (ref <150)

## 2021-01-14 ENCOUNTER — Other Ambulatory Visit: Payer: Self-pay | Admitting: Internal Medicine

## 2021-01-15 NOTE — Telephone Encounter (Signed)
Requested Prescriptions  Pending Prescriptions Disp Refills   hydrALAZINE (APRESOLINE) 100 MG tablet [Pharmacy Med Name: hydrALAZINE HCl 100 MG Oral Tablet] 270 tablet 1    Sig: TAKE 1 TABLET BY MOUTH 3  TIMES DAILY     Cardiovascular:  Vasodilators Passed - 01/14/2021 11:08 PM      Passed - HCT in normal range and within 360 days    HCT  Date Value Ref Range Status  01/12/2021 38.4 35.0 - 45.0 % Final  10/12/2013 40.5 35.0 - 47.0 % Final  08/13/2009 36.8 34.8 - 46.6 % Final         Passed - HGB in normal range and within 360 days    Hemoglobin  Date Value Ref Range Status  01/12/2021 12.5 11.7 - 15.5 g/dL Final   HGB  Date Value Ref Range Status  10/12/2013 13.5 12.0 - 16.0 g/dL Final  08/13/2009 12.8 11.6 - 15.9 g/dL Final         Passed - RBC in normal range and within 360 days    RBC  Date Value Ref Range Status  01/12/2021 4.18 3.80 - 5.10 Million/uL Final         Passed - WBC in normal range and within 360 days    WBC  Date Value Ref Range Status  01/12/2021 3.9 3.8 - 10.8 Thousand/uL Final         Passed - PLT in normal range and within 360 days    Platelets  Date Value Ref Range Status  01/12/2021 250 140 - 400 Thousand/uL Final  08/13/2009 271 145 - 400 10e3/uL Final   Platelet  Date Value Ref Range Status  10/12/2013 278 150 - 440 x10 3/mm 3 Final         Passed - Last BP in normal range    BP Readings from Last 1 Encounters:  01/12/21 138/82         Passed - Valid encounter within last 12 months    Recent Outpatient Visits          3 days ago Encounter for general adult medical examination with abnormal findings   [facility] [place], Coralie Keens, NP   4 months ago Herpes zoster with other complication   [facility] [place], Coralie Keens, NP

## 2021-01-17 DIAGNOSIS — C50919 Malignant neoplasm of unspecified site of unspecified female breast: Secondary | ICD-10-CM

## 2021-01-17 HISTORY — DX: Malignant neoplasm of unspecified site of unspecified female breast: C50.919

## 2021-01-19 ENCOUNTER — Ambulatory Visit: Payer: Medicare Other | Attending: Internal Medicine

## 2021-01-19 DIAGNOSIS — Z23 Encounter for immunization: Secondary | ICD-10-CM

## 2021-01-19 NOTE — Progress Notes (Signed)
° °  Covid-19 Vaccination Clinic  Name:  Carrie Singh    MRN: [id number] DOB: 1955-11-23  01/19/2021  Ms. Shetterly was observed post Covid-19 immunization for 15 minutes without incident. She was provided with Vaccine Information Sheet and instruction to access the V-Safe system.   Ms. Glade was instructed to call 911 with any severe reactions post vaccine: Difficulty breathing  Swelling of face and throat  A fast heartbeat  A bad rash all over body  Dizziness and weakness   Immunizations Administered     Name Date Dose VIS Date Route   Pfizer Covid-19 Vaccine Bivalent Booster 01/19/2021  1:50 PM 0.3 mL 09/16/2020 Intramuscular   Manufacturer: [place]   Lot: [id number]   [place]: [phone]

## 2021-01-20 ENCOUNTER — Encounter: Payer: Self-pay | Admitting: Internal Medicine

## 2021-01-21 ENCOUNTER — Other Ambulatory Visit (HOSPITAL_BASED_OUTPATIENT_CLINIC_OR_DEPARTMENT_OTHER): Payer: Self-pay

## 2021-01-21 MED ORDER — PFIZER COVID-19 VAC BIVALENT 30 MCG/0.3ML IM SUSP
INTRAMUSCULAR | 0 refills | Status: DC
  Filled 2021-01-21: qty 0.3, 1d supply, fill #0

## 2021-01-21 MED ORDER — SHINGRIX 50 MCG/0.5ML IM SUSR: INTRAMUSCULAR | 1 refills | Status: DC

## 2021-02-02 ENCOUNTER — Other Ambulatory Visit: Payer: Self-pay | Admitting: Adult Health

## 2021-02-02 ENCOUNTER — Ambulatory Visit
Admission: RE | Admit: 2021-02-02 | Discharge: 2021-02-02 | Disposition: A | Discharge status: Home / Self Care | Payer: Medicare Other | Source: Ambulatory Visit | Attending: Adult Health | Admitting: Adult Health

## 2021-02-02 ENCOUNTER — Other Ambulatory Visit: Payer: Self-pay

## 2021-02-02 DIAGNOSIS — R928 Other abnormal and inconclusive findings on diagnostic imaging of breast: Secondary | ICD-10-CM

## 2021-02-04 ENCOUNTER — Ambulatory Visit
Admission: RE | Admit: 2021-02-04 | Discharge: 2021-02-04 | Disposition: A | Discharge status: Home / Self Care | Payer: Medicare Other | Source: Ambulatory Visit | Attending: Adult Health | Admitting: Adult Health

## 2021-02-04 DIAGNOSIS — R928 Other abnormal and inconclusive findings on diagnostic imaging of breast: Secondary | ICD-10-CM

## 2021-02-05 ENCOUNTER — Telehealth: Payer: Self-pay | Admitting: Hematology and Oncology

## 2021-02-05 ENCOUNTER — Encounter: Payer: Self-pay | Admitting: *Deleted

## 2021-02-05 NOTE — Telephone Encounter (Signed)
Scheduled appointment per 1/20 scheduling message. Patient is aware of upcoming appointment.

## 2021-02-09 ENCOUNTER — Ambulatory Visit: Payer: Self-pay | Admitting: Surgery

## 2021-02-10 ENCOUNTER — Ambulatory Visit: Payer: Medicare Other | Attending: Adult Health

## 2021-02-10 ENCOUNTER — Other Ambulatory Visit: Payer: Self-pay

## 2021-02-10 ENCOUNTER — Other Ambulatory Visit: Payer: Self-pay | Admitting: *Deleted

## 2021-02-10 DIAGNOSIS — C50511 Malignant neoplasm of lower-outer quadrant of right female breast: Secondary | ICD-10-CM | POA: Insufficient documentation

## 2021-02-10 DIAGNOSIS — Z170 Estrogen receptor positive status [ER+]: Secondary | ICD-10-CM | POA: Diagnosis not present

## 2021-02-10 DIAGNOSIS — R293 Abnormal posture: Secondary | ICD-10-CM | POA: Insufficient documentation

## 2021-02-10 NOTE — Assessment & Plan Note (Signed)
Right simple mastectomy: Grade 2 IDC with DCIS 1.5 cm, margins -0/3 lymph nodes, T1b N0 stage IA, ER 100%, PR 90%, Ki-67 10%, HER-2 negative ratio 1.28 Oncotype DX score 11:7% risk of recurrence (2009 right breast cancer treated with lumpectomy radiation and 5 years of antiestrogen therapy)  Current treatment: Letrozole 2.5 mg daily started 04/2016 switched to exemestane 05/10/2018switched to anastrozole I discussed with her about staying on it for total of 7 years because this is a breast cancer that recurred 2 years after her initial therapy.  Breast Cancer Surveillance: 1. Breast exam1/26/2023:No palpable lumps or nodules of concern 2. Mammogramleft breast: With ultrasound 02/02/2021: 0.6 cm irregular hypoechoic mass: Biopsy:   3.bone density: 08/23/2017: T score -0.1: Normal.   CT chest and MRI brain 05/31/2017: Negative for metastatic disease X-ray lumbar spine 04/23/2018: Degenerative joint disease L4-L5  Anastrozoletoxicities: Muscle and joint painsare tolerable I encouraged her to walk daily for exercise.  Return to clinic in 1 year for follow-up

## 2021-02-10 NOTE — Therapy (Signed)
Carrie Singh @ [facility] [place], [place], [address] Phone: [phone]   Fax:  [phone]  Physical Therapy Evaluation  Patient Details  Name: Carrie Singh MRN: [id number] Date of Birth: 04/20/1955 Referring Provider (PT): Thedore Mins   Encounter Date: 02/10/2021   PT End of Session - 02/10/21 1715     Visit Number 1    Number of Visits 2    Date for PT Re-Evaluation 05/05/21    PT Start Time 1603    PT Stop Time 6962    PT Time Calculation (min) 47 min    Activity Tolerance Patient tolerated treatment well    Behavior During Therapy [facility] for tasks assessed/performed             Past Medical History:  Diagnosis Date   Breast cancer ([place])    Breast cancer, right breast ([place]) 12/2007   a. s/p chemo, radiation, and lumpectomy with negative sentinel lymph node biopsy    Depression    Diabetes mellitus without complication ([place])    GERD (gastroesophageal reflux disease)    Hyperlipidemia    Hypertension    resistant, negative renal Dopplers and negative CT angiogram   Migraines    Obesity    Seizures ([place])    last seizure  1 week ago: states she jumps for a second and it's gone    Past Surgical History:  Procedure Laterality Date   ABDOMINAL HYSTERECTOMY     BREAST LUMPECTOMY     CYST EXCISION     FOOT SURGERY     MASTECTOMY Right    MASTECTOMY W/ SENTINEL NODE BIOPSY Right 03/18/2016   Procedure: RIGHT MASTECTOMY WITH RIGHT AXILLARY SENTINEL LYMPH NODE BIOPSY;  Surgeon: Alphonsa Overall, MD;  Location: [facility];  Service: General;  Laterality: Right;   ovary tumor     vocal cord nodules      There were no vitals filed for this visit.    Subjective Assessment - 02/10/21 1608     Subjective Pt had a mammogram 2 weeks ago which discovered the mass.  She is scheduled for a mastectomy on 03/10/2021 for gr. 2-3 invasive ductal carcinoma.. Will find out tomorrow after seeing Dr. Lindi Adie about further therapies.     Pertinent History She has a history of right breast cancer treated initially back in 2009 with breast conserving surgery. She had additional cancer in 2018 and right breast underwent simple mastectomy. She had symptom mapping both times and radiation with her lumpectomy. 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 She is scheduled for a left mastectomy on 03/10/2021. Pt has a hx of renal disease, DM, shingles. She is on disability. Right shoulder ROM has been limited since prior surgeries.  she never had rehab.    Patient Stated Goals Pre- surgical screening,gain information from provider    Currently in Pain? Yes    Pain Score 5    pain from biopsy   Pain Location Breast    Pain Orientation Left    Pain Descriptors / Indicators Nagging;Aching    Pain Type Acute pain    Pain Onset In the past 7 days    Pain Frequency Constant    Aggravating Factors  just hurts since biopsy    Pain Relieving Factors nothing improves    Effect of  Pain on Daily Activities disturbed sleep, difficulty sleeping on left                [facility] PT Assessment - 02/10/21 0001       Assessment   Medical Diagnosis Left breast Cancer    Referring Provider (PT) Thedore Mins    Onset Date/Surgical Date 03/10/21    Hand Dominance Right    Prior Therapy no      Precautions   Precaution Comments Active CA      Restrictions   Weight Bearing Restrictions No      Balance Screen   Has the patient fallen in the past 6 months No    Has the patient had a decrease in activity level because of a fear of falling?  No    Is the patient reluctant to leave their home because of a fear of falling?  No      Home Ecologist residence    Living Arrangements Spouse/significant other    Available Help at Discharge Family      Prior Function   Level  of Independence Independent    Vocation On disability    Leisure some walking, some reading      Cognition   Overall Cognitive Status Within Functional Limits for tasks assessed      Posture/Postural Control   Posture/Postural Control Postural limitations    Postural Limitations Rounded Shoulders;Forward head      AROM   AROM Assessment Site Shoulder    Right/Left Shoulder Right;Left    Right Shoulder Extension 70 Degrees    Right Shoulder Flexion 142 Degrees    Right Shoulder ABduction 145 Degrees    Right Shoulder Internal Rotation 52 Degrees    Right Shoulder External Rotation 99 Degrees    Left Shoulder Extension 72 Degrees    Left Shoulder Flexion 158 Degrees    Left Shoulder ABduction 179 Degrees    Left Shoulder Internal Rotation 65 Degrees    Left Shoulder External Rotation 105 Degrees               LYMPHEDEMA/ONCOLOGY QUESTIONNAIRE - 02/10/21 0001       Type   Cancer Type Left Breast Cancer      Surgeries   Mastectomy Date 03/10/21    Lumpectomy Date --   2009; Right Lumpectomy   Sentinel Lymph Node Biopsy Date 03/10/21    Other Surgery Date 03/18/16   Right Mastectomy     Treatment   Active Chemotherapy Treatment No    Past Chemotherapy Treatment No    Active Radiation Treatment No    Past Radiation Treatment No    Current Hormone Treatment No    Past Hormone Therapy No      What other symptoms do you have   Are you Having Heaviness or Tightness No    Are you having Pain Yes   from biopsy   Are you having pitting edema No    Is it Hard or Difficult finding clothes that fit No    Do you have infections Yes    Comments had right breast infection after mastectomy    Is there Decreased scar mobility No      Right Upper Extremity Lymphedema   10 cm Proximal to Olecranon Process 38.4 cm    Olecranon Process 29.5 cm    10 cm Proximal to Ulnar Styloid Process 26.4 cm    Just Proximal to Ulnar Styloid Process 18.5 cm  At [facility] of 2nd Digit 6.4 cm       Left Upper Extremity Lymphedema   10 cm Proximal to Olecranon Process 36.4 cm    Olecranon Process 28.3 cm    10 cm Proximal to Ulnar Styloid Process 26 cm    Just Proximal to Ulnar Styloid Process 18.5 cm    At Base of 2nd Digit 6.2 cm             L-DEX FLOWSHEETS - 02/10/21 1700       L-DEX LYMPHEDEMA SCREENING   Measurement Type Bilateral    L-DEX MEASUREMENT EXTREMITY Upper Extremity    POSITION  Standing    DOMINANT SIDE Right    At Risk Side Left    BASELINE SCORE (UNILATERAL) -4.4            The patient was assessed using the L-Dex machine today to produce a lymphedema index baseline score. The patient will be reassessed on a regular basis (typically every 3 months) to obtain new L-Dex scores. If the score is > 6.5 points away from his/her baseline score indicating onset of subclinical lymphedema, it will be recommended to wear a compression garment for 4 weeks, 12 hours per day and then be reassessed. If the score continues to be > 6.5 points from baseline at reassessment, we will initiate lymphedema treatment. Assessing in this manner has a 95% rate of preventing clinically significant lymphedema.       [name] - 02/10/21 0001     Open a tight or new jar Mild difficulty    Do heavy household chores (wash walls, wash floors) Moderate difficulty    Carry a shopping bag or briefcase Moderate difficulty    Wash your back Severe difficulty    Use a knife to cut food Moderate difficulty    Recreational activities in which you take some force or impact through your arm, shoulder, or hand (golf, hammering, tennis) Moderate difficulty    During the past week, to what extent has your arm, shoulder or hand problem interfered with your normal social activities with family, friends, neighbors, or groups? Modererately    During the past week, to what extent has your arm, shoulder or hand problem limited your work or other regular daily activities Modererately    Arm,  shoulder, or hand pain. Mild    Tingling (pins and needles) in your arm, shoulder, or hand Mild    Difficulty Sleeping Moderate difficulty    DASH Score 45.45 %              Objective measurements completed on examination: See above findings.                PT Education - 02/10/21 1714     Education Details 4 post op exercises, ABC class, lymphedema, SOZO every 3 mos post surgery    Person(s) Educated Patient;Spouse    Methods Explanation;Handout    Comprehension Verbalized understanding                 PT Long Term Goals - 02/10/21 1726       PT LONG TERM GOAL #1   Title Pt will restore left shoulder ROM to within 5-10 degrees of pre-op levels for functional use of left UE    Time 12    Period Weeks    Status New    Target Date 05/05/21             Breast Clinic Goals - 02/10/21  1725       Patient will be able to verbalize understanding of pertinent lymphedema risk reduction practices relevant to her diagnosis specifically related to skin care.   Time 1    Period Days    Status Achieved    Target Date 02/10/21      Patient will be able to return demonstrate and/or verbalize understanding of the post-op home exercise program related to regaining shoulder range of motion.   Time 1    Period Days    Status Achieved    Target Date 02/10/21      Patient will be able to verbalize understanding of the importance of attending the postoperative After Breast Cancer Class for further lymphedema risk reduction education and therapeutic exercise.   Time 1    Period Days    Status Achieved    Target Date 02/10/21                   Plan - 02/10/21 1716     Clinical Impression Statement Pt is pending left breast mastectomy on 03/10/2021. She is seen today for pre-op baselines.  Bilateral shoulder ROM assessed as well as circumference measures bilaterally.  Pt did her baseline SOZO screen.  She and her husband were educated in and scheduled  for the ABC class, post op follow up, and SOZO screenings.  She was educated in 4 post op exercises to perform as allowed by MD after sx,, as well as skin care and precautions for lymphedema. Despite having had 2 prior right breast surgeries she was not familiar with lymphedema. She was advised to call or email with questions or concerns.    Personal Factors and Comorbidities Comorbidity 3+    Comorbidities Active CA, prior right breast surgeries/infection, DM, kidney disease    Stability/Clinical Decision Making Stable/Uncomplicated    Clinical Decision Making Low    Rehab Potential Excellent    PT Frequency --   1 more visit for reassessment after surgery   PT Duration 12 weeks    PT Treatment/Interventions ADLs/Self Care Home Management;Therapeutic exercise;Patient/family education;Scar mobilization    PT Next Visit Plan Reassess after post op.  ABC and SOZO already scheduled    Consulted and Agree with Plan of Care Patient            Patient will follow up at outpatient cancer rehab 3-4 weeks following surgery.  If the patient requires physical therapy at that time, a specific plan will be dictated and sent to the referring physician for approval. The patient was educated today on appropriate basic range of motion exercises to begin post operatively and the importance of attending the After Breast Cancer class following surgery.  Patient was educated today on lymphedema risk reduction practices as it pertains to recommendations that will benefit the patient immediately following surgery.  She verbalized good understanding.    Patient will benefit from skilled therapeutic intervention in order to improve the following deficits and impairments:  Decreased range of motion, Decreased knowledge of precautions, Postural dysfunction  Visit Diagnosis: Malignant neoplasm of lower-outer quadrant of right female breast, unspecified estrogen receptor status ([place])  Abnormal posture     Problem  List Patient Active Problem List   Diagnosis Date Noted   Class 1 obesity due to excess calories with body mass index (BMI) of 34.0 to 34.9 in adult 08/21/2020   CKD (chronic kidney disease) stage 3, GFR 30-59 ml/min (HCC) 05/30/2019   GERD (gastroesophageal reflux disease) 11/30/2017   Intractable  episodic cluster headache 12/04/2014   Depression with somatization 12/04/2014   HTN (hypertension) 04/24/2013   HLD (hyperlipidemia) 04/24/2013   DM2 (diabetes mellitus, type 2) ([place]) 04/24/2013   Malignant neoplasm of lower-outer quadrant of right breast of female, estrogen receptor positive ([place]) 12/18/2007    Claris Pong, PT 02/10/2021, 5:28 PM  [facility] @ [facility] [place], [place], [address] Phone: [phone]   Fax:  [phone]  Name: Carrie Singh MRN: [id number] Date of Birth: 04-21-1955

## 2021-02-10 NOTE — Patient Instructions (Signed)
Physical Therapy Information for After Breast Cancer Surgery/Treatment:  Lymphedema is a swelling condition that you may be at risk for in your arm if you have lymph nodes removed from the armpit area.  After a sentinel node biopsy, the risk is approximately 5-9% and is higher after an axillary node dissection.  There is treatment available for this condition and it is not life-threatening.  Contact your physician or physical therapist with concerns. You may begin the 4 shoulder/posture exercises (see additional sheet) when permitted by your physician (typically a week after surgery).  If you have drains, you may need to wait until those are removed before beginning range of motion exercises.  A general recommendation is to not lift your arms above shoulder height until drains are removed.  These exercises should be done to your tolerance and gently.  This is not a "no pain/no gain" type of recovery so listen to your body and stretch into the range of motion that you can tolerate, stopping if you have pain.  If you are having immediate reconstruction, ask your plastic surgeon about doing exercises as he or she may want you to wait. We encourage you to attend the free one time ABC (After Breast Cancer) class offered by [facility].  You will learn information related to lymphedema risk, prevention and treatment and additional exercises to regain mobility following surgery.  You can call [phone] for more information.  This is offered the 1st and 3rd Monday of each month.  You only attend the class one time. While undergoing any medical procedure or treatment, try to avoid blood pressure being taken or needle sticks from occurring on the arm on the side of cancer.   This recommendation begins after surgery and continues for the rest of your life.  This may help reduce your risk of getting lymphedema (swelling in your arm). An excellent resource for those seeking information on  lymphedema is the National Lymphedema Network's web site. It can be accessed at [place].org If you notice swelling in your hand, arm or breast at any time following surgery (even if it is many years from now), please contact your doctor or physical therapist to discuss this.  Lymphedema can be treated at any time but it is easier for you if it is treated early on.  If you feel like your shoulder motion is not returning to normal in a reasonable amount of time, please contact your surgeon or physical therapist.  [facility]. [place], [place], [place] [phone]; [address] [place], [place] [address] ABC CLASS After Breast Cancer Class  After Breast Cancer Class is a specially designed exercise class to assist you in a safe recover after having breast cancer surgery.  In this class you will learn how to get back to full function whether your drains were just removed or if you had surgery a month ago.  This one-time class is held the 1st and 3rd Monday of every month from 11:00 a.m. until 12:00 noon and is a virtual Webex class  This class is FREE and space is limited. For more information or to register for the next available class,   Class Goals  Understand specific stretches to improve the flexibility of you chest and shoulder. Learn ways to safely strengthen your upper body and improve your posture. Understand the warning signs of infection and why you may be at risk for an arm infection. Learn about Lymphedema and prevention.  ** You do not attend  this class until after surgery.  Drains must be removed to participate  Patient was instructed today in a home exercise program today for post op shoulder range of motion. These included active assist shoulder flexion in sitting/supine, scapular retraction, wall walking with shoulder abduction, and hands behind head external rotation in supine.  She was encouraged to do these twice a day, holding 3 seconds and repeating 5 times when permitted by her  physician.

## 2021-02-10 NOTE — Progress Notes (Signed)
Patient Care Team: Jearld Fenton, NP as PCP - General (Internal Medicine) Minna Merritts, MD as Consulting Physician (Cardiology) Nicholas Lose, MD as Consulting Physician (Hematology and Oncology) Alphonsa Overall, MD as Consulting Physician (General Surgery) Delice Bison, Charlestine Massed, NP as Nurse Practitioner (Hematology and Oncology)  DIAGNOSIS:    ICD-10-CM   1. Malignant neoplasm of lower-outer quadrant of right breast of female, estrogen receptor positive ([place])  C50.511    Z17.0       SUMMARY OF ONCOLOGIC HISTORY: Oncology History  Malignant neoplasm of lower-outer quadrant of right breast of female, estrogen receptor positive ([place])  12/18/2007 Initial Diagnosis   Right breast cancer treated with lumpectomy followed by radiation and 5 years of antiestrogen therapy with aromatase inhibitor   02/02/2016 Relapse/Recurrence   Right breast biopsy 6:00 position: IDC with DCIS grade 2, ER 100%, PR 90%, Ki-67 10%, HER-2 negative ratio 1.28, screening detected right breast lumps 0.5 cm and 0.3 cm, T1a N0 stage IA clinical stage   03/18/2016 Surgery   Right simple mastectomy: Grade 2 IDC with DCIS 1.5 cm, margins -0/3 lymph nodes, T1b N0 stage IA, ER 100%, PR 90%, Ki-67 10%, HER-2 negative ratio 1.28   03/18/2016 Oncotype testing   11/7%   04/2016 -  Anti-estrogen oral therapy   Letrozole 2.89m daily switch to exemestane 25 mg daily May 2018 switched to anastrozole 1 mg daily due to cost     CHIEF COMPLIANT: Recurrent breast cancer  INTERVAL HISTORY: Carrie WOLLENBERGis a 66y.o. with above-mentioned history of  right breast cancer treated with mastectomy followed by antiestrogen therapy with anastrozole. Mammogram on 02/02/2021 showed suspicious mass in the left breast at 3 o'clock measuring 0.6 cm. Biopsy on 02/04/2021 showed grade 2 to 3 invasive ductal carcinoma. She presents to the clinic today for follow-up.   ALLERGIES:  has No Known Allergies.  MEDICATIONS:  Current  Outpatient Medications  Medication Sig Dispense Refill   amLODipine (NORVASC) 2.5 MG tablet Take by mouth.     anastrozole (ARIMIDEX) 1 MG tablet Take 1 tablet (1 mg total) by mouth daily. 90 tablet 3   carvedilol (COREG) 12.5 MG tablet Take 12.5 mg by mouth in the morning and at bedtime.     COVID-19 mRNA bivalent vaccine, Pfizer, (PFIZER COVID-19 VAC BIVALENT) injection Inject into the muscle. 0.3 mL 0   gabapentin (NEURONTIN) 100 MG capsule Take 1 capsule (100 mg total) by mouth 3 (three) times daily. 30 capsule 2   hydrALAZINE (APRESOLINE) 100 MG tablet TAKE 1 TABLET BY MOUTH 3  TIMES DAILY 270 tablet 1   irbesartan (AVAPRO) 300 MG tablet TAKE 1 TABLET BY MOUTH  DAILY 90 tablet 1   Lancets (ONETOUCH DELICA PLUS [id number] MISC CHECK BLOOD SUGAR TWICE  DAILY 200 each 3   Multiple Vitamins-Minerals (ONE-A-DAY WOMENS VITACRAVES) CHEW Chew 1 tablet by mouth daily.      ONETOUCH ULTRA test strip USE TWICE DAILY AS DIRECTED 200 strip 3   pravastatin (PRAVACHOL) 40 MG tablet TAKE 1 TABLET BY MOUTH  DAILY 90 tablet 0   repaglinide (PRANDIN) 0.5 MG tablet Take 0.5 mg by mouth 3 (three) times daily before meals.     SHINGRIX injection Inject 0.5 mL into muscle for shingles vaccine. Repeat dose in 2-6 months. 0.5 mL 1   No current facility-administered medications for this visit.    PHYSICAL EXAMINATION: ECOG PERFORMANCE STATUS: 1 - Symptomatic but completely ambulatory  Vitals:   02/11/21 0952  BP: (!) 151/73  Pulse: 83  Resp: 18  Temp: 97.7 F (36.5 C)  SpO2: 99%   Filed Weights   02/11/21 0952  Weight: 209 lb 12.8 oz (95.2 kg)      LABORATORY DATA:  I have reviewed the data as listed CMP Latest Ref Rng & Units 01/12/2021 01/09/2020 05/30/2019  Glucose 65 - 139 mg/dL 178(H) 131(H) 202(H)  BUN 7 - 25 mg/dL 20 17 20   Creatinine 0.50 - 1.05 mg/dL 1.31(H) 1.30(H) 1.29(H)  Sodium 135 - 146 mmol/L 143 140 138  Potassium 3.5 - 5.3 mmol/L 4.2 4.4 4.0  Chloride 98 - 110 mmol/L 104 104  104  CO2 20 - 32 mmol/L 33(H) 31 31  Calcium 8.6 - 10.4 mg/dL 9.9 10.1 9.5  Total Protein 6.1 - 8.1 g/dL 6.6 7.2 6.8  Total Bilirubin 0.2 - 1.2 mg/dL 0.9 1.2 1.1  Alkaline Phos 39 - 117 U/L - 68 78  AST 10 - 35 U/L 12 14 15   ALT 6 - 29 U/L 11 14 14     Lab Results  Component Value Date   WBC 3.9 01/12/2021   HGB 12.5 01/12/2021   HCT 38.4 01/12/2021   MCV 91.9 01/12/2021   PLT 250 01/12/2021   NEUTROABS 2.7 01/24/2018    ASSESSMENT & PLAN:  Malignant neoplasm of lower-outer quadrant of right breast of female, estrogen receptor positive ([place]) Right simple mastectomy: Grade 2 IDC with DCIS 1.5 cm, margins -0/3 lymph nodes, T1b N0 stage IA, ER 100%, PR 90%, Ki-67 10%, HER-2 negative ratio 1.28 Oncotype DX score 11:7% risk of recurrence (2009 right breast cancer treated with lumpectomy radiation and 5 years of antiestrogen therapy)   Current treatment: Letrozole 2.5 mg daily started 04/2016 switched to exemestane 05/26/2016 switched to anastrozole   Breast cancer recurrence 1. Mammogram left breast: With ultrasound 02/02/2021: 0.6 cm irregular hypoechoic mass: Biopsy: Grade 2 invasive ductal carcinoma ER 100%, PR 0%, HER2 equivocal, FISH pending  Counseling: I discussed with the patient about the breast cancer recurrence and its characteristics.  Because her tumor is small at 0.6 cm and it is ER positive, there should not be any role of chemotherapy.   Recommendation: Mastectomy Followed by adjuvant antiestrogen therapy.  We may have to choose a different antiestrogen therapy since anastrozole failed to prevent this recurrence.   Return to clinic after surgery to discuss the pathology report.    No orders of the defined types were placed in this encounter.  The patient has a good understanding of the overall plan. she agrees with it. she will call with any problems that may develop before the next visit here.  Total time spent: 20 mins including face to face time and time spent  for planning, charting and coordination of care  Rulon Eisenmenger, MD, MPH 02/11/2021  I, Thana Ates, am acting as scribe for Dr. Nicholas Lose.  I have reviewed the above documentation for accuracy and completeness, and I agree with the above.

## 2021-02-11 ENCOUNTER — Inpatient Hospital Stay: Payer: Medicare Other | Attending: Hematology and Oncology | Admitting: Hematology and Oncology

## 2021-02-11 DIAGNOSIS — Z79811 Long term (current) use of aromatase inhibitors: Secondary | ICD-10-CM | POA: Diagnosis not present

## 2021-02-11 DIAGNOSIS — Z79899 Other long term (current) drug therapy: Secondary | ICD-10-CM | POA: Insufficient documentation

## 2021-02-11 DIAGNOSIS — Z170 Estrogen receptor positive status [ER+]: Secondary | ICD-10-CM | POA: Diagnosis not present

## 2021-02-11 DIAGNOSIS — C50511 Malignant neoplasm of lower-outer quadrant of right female breast: Secondary | ICD-10-CM

## 2021-02-11 DIAGNOSIS — Z9011 Acquired absence of right breast and nipple: Secondary | ICD-10-CM | POA: Diagnosis not present

## 2021-02-12 ENCOUNTER — Encounter: Payer: Self-pay | Admitting: *Deleted

## 2021-02-12 ENCOUNTER — Other Ambulatory Visit: Payer: Medicare Other

## 2021-02-18 DIAGNOSIS — C50912 Malignant neoplasm of unspecified site of left female breast: Secondary | ICD-10-CM | POA: Insufficient documentation

## 2021-02-24 ENCOUNTER — Other Ambulatory Visit: Payer: Self-pay | Admitting: Internal Medicine

## 2021-02-24 NOTE — Telephone Encounter (Signed)
Requested Prescriptions  Pending Prescriptions Disp Refills   Lancets (ONETOUCH DELICA PLUS [id number]) [place] [Pharmacy Med Name: OneTouch Delica Plus [id number]] 200 each 3    Sig: [place]  DAILY     Endocrinology: Diabetes - Testing Supplies Passed - 02/24/2021  9:58 AM      Passed - Valid encounter within last 12 months    Recent Outpatient Visits          1 month ago Encounter for general adult medical examination with abnormal findings   [facility] [place], Coralie Keens, NP   6 months ago Herpes zoster with other complication   [facility] [place], Coralie Keens, NP

## 2021-03-02 DIAGNOSIS — D631 Anemia in chronic kidney disease: Secondary | ICD-10-CM | POA: Diagnosis not present

## 2021-03-02 DIAGNOSIS — N1831 Chronic kidney disease, stage 3a: Secondary | ICD-10-CM | POA: Diagnosis not present

## 2021-03-02 DIAGNOSIS — E1122 Type 2 diabetes mellitus with diabetic chronic kidney disease: Secondary | ICD-10-CM | POA: Diagnosis not present

## 2021-03-02 DIAGNOSIS — I129 Hypertensive chronic kidney disease with stage 1 through stage 4 chronic kidney disease, or unspecified chronic kidney disease: Secondary | ICD-10-CM | POA: Diagnosis not present

## 2021-03-03 ENCOUNTER — Other Ambulatory Visit: Payer: Self-pay

## 2021-03-03 ENCOUNTER — Encounter (HOSPITAL_BASED_OUTPATIENT_CLINIC_OR_DEPARTMENT_OTHER): Payer: Self-pay | Admitting: Surgery

## 2021-03-05 ENCOUNTER — Encounter (HOSPITAL_BASED_OUTPATIENT_CLINIC_OR_DEPARTMENT_OTHER)
Admission: RE | Admit: 2021-03-05 | Discharge: 2021-03-05 | Disposition: A | Discharge status: Home / Self Care | Payer: Medicare Other | Source: Ambulatory Visit | Attending: Surgery | Admitting: Surgery

## 2021-03-05 DIAGNOSIS — Z01818 Encounter for other preprocedural examination: Secondary | ICD-10-CM | POA: Insufficient documentation

## 2021-03-05 LAB — BASIC METABOLIC PANEL
Anion gap: 8 (ref 5–15)
BUN: 14 mg/dL (ref 8–23)
CO2: 26 mmol/L (ref 22–32)
Calcium: 9 mg/dL (ref 8.9–10.3)
Chloride: 104 mmol/L (ref 98–111)
Creatinine, Ser: 1.07 mg/dL — ABNORMAL HIGH (ref 0.44–1.00)
GFR, Estimated: 57 mL/min — ABNORMAL LOW (ref >60)
Glucose, Bld: 119 mg/dL — ABNORMAL HIGH (ref 70–99)
Potassium: 3.6 mmol/L (ref 3.5–5.1)
Sodium: 138 mmol/L (ref 135–145)

## 2021-03-05 NOTE — Progress Notes (Signed)

## 2021-03-07 ENCOUNTER — Other Ambulatory Visit: Payer: Self-pay | Admitting: Internal Medicine

## 2021-03-08 NOTE — Telephone Encounter (Signed)
Requested Prescriptions  Pending Prescriptions Disp Refills   pravastatin (PRAVACHOL) 40 MG tablet [Pharmacy Med Name: Pravastatin Sodium 40 MG Oral Tablet] 90 tablet 1    Sig: TAKE 1 TABLET BY MOUTH DAILY     Cardiovascular:  Antilipid - Statins Failed - 03/07/2021 10:15 PM      Failed - Lipid Panel in normal range within the last 12 months    Cholesterol, Total  Date Value Ref Range Status  10/17/2014 179 100 - 199 mg/dL Final   Cholesterol  Date Value Ref Range Status  01/12/2021 173 <200 mg/dL Final   LDL Cholesterol (Calc)  Date Value Ref Range Status  01/12/2021 86 mg/dL (calc) Final    Comment:    Reference range: <100 . Desirable range <100 mg/dL for primary prevention;   <70 mg/dL for patients with CHD or diabetic patients  with > or = 2 CHD risk factors. Marland Kitchen LDL-C is now calculated using the Martin-Hopkins  calculation, which is a validated novel method providing  better accuracy than the Friedewald equation in the  estimation of LDL-C.  Cresenciano Genre et al. Annamaria Helling. 9371;696(78): 2061-2068  (http://education.[facility].com/faq/FAQ164)    Direct LDL  Date Value Ref Range Status  09/05/2013 169.6 mg/dL Final    Comment:    Optimal:  <100 mg/dLNear or Above Optimal:  100-129 mg/dLBorderline High:  130-159 mg/dLHigh:  160-189 mg/dLVery High:  >190 mg/dL   HDL  Date Value Ref Range Status  01/12/2021 62 > OR = 50 mg/dL Final  10/17/2014 59 >39 mg/dL Final    Comment:    According to ATP-III Guidelines, HDL-C >59 mg/dL is considered a negative risk factor for CHD.    Triglycerides  Date Value Ref Range Status  01/12/2021 148 <150 mg/dL Final         Passed - Patient is not pregnant      Passed - Valid encounter within last 12 months    Recent Outpatient Visits          1 month ago Encounter for general adult medical examination with abnormal findings   [facility] [place], Coralie Keens, NP   6 months ago Herpes zoster with other complication    [facility] [place], Coralie Keens, NP

## 2021-03-10 ENCOUNTER — Other Ambulatory Visit: Payer: Self-pay

## 2021-03-10 ENCOUNTER — Encounter (HOSPITAL_BASED_OUTPATIENT_CLINIC_OR_DEPARTMENT_OTHER): Payer: Self-pay | Admitting: Surgery

## 2021-03-10 ENCOUNTER — Ambulatory Visit (HOSPITAL_BASED_OUTPATIENT_CLINIC_OR_DEPARTMENT_OTHER): Payer: Medicare Other | Admitting: Anesthesiology

## 2021-03-10 ENCOUNTER — Ambulatory Visit (HOSPITAL_BASED_OUTPATIENT_CLINIC_OR_DEPARTMENT_OTHER)
Admission: RE | Admit: 2021-03-10 | Discharge: 2021-03-11 | Disposition: A | Discharge status: Home / Self Care | Payer: Medicare Other | Attending: Surgery | Admitting: Surgery

## 2021-03-10 ENCOUNTER — Encounter (HOSPITAL_BASED_OUTPATIENT_CLINIC_OR_DEPARTMENT_OTHER)
Admission: RE | Disposition: A | Discharge status: Home / Self Care | Payer: Self-pay | Source: Home / Self Care | Attending: Surgery

## 2021-03-10 DIAGNOSIS — Z6835 Body mass index (BMI) 35.0-35.9, adult: Secondary | ICD-10-CM | POA: Insufficient documentation

## 2021-03-10 DIAGNOSIS — F32A Depression, unspecified: Secondary | ICD-10-CM | POA: Insufficient documentation

## 2021-03-10 DIAGNOSIS — N189 Chronic kidney disease, unspecified: Secondary | ICD-10-CM | POA: Insufficient documentation

## 2021-03-10 DIAGNOSIS — N289 Disorder of kidney and ureter, unspecified: Secondary | ICD-10-CM | POA: Diagnosis not present

## 2021-03-10 DIAGNOSIS — Z170 Estrogen receptor positive status [ER+]: Secondary | ICD-10-CM | POA: Insufficient documentation

## 2021-03-10 DIAGNOSIS — Z87891 Personal history of nicotine dependence: Secondary | ICD-10-CM | POA: Insufficient documentation

## 2021-03-10 DIAGNOSIS — I10 Essential (primary) hypertension: Secondary | ICD-10-CM | POA: Diagnosis not present

## 2021-03-10 DIAGNOSIS — I129 Hypertensive chronic kidney disease with stage 1 through stage 4 chronic kidney disease, or unspecified chronic kidney disease: Secondary | ICD-10-CM | POA: Diagnosis not present

## 2021-03-10 DIAGNOSIS — C50412 Malignant neoplasm of upper-outer quadrant of left female breast: Secondary | ICD-10-CM

## 2021-03-10 DIAGNOSIS — E1122 Type 2 diabetes mellitus with diabetic chronic kidney disease: Secondary | ICD-10-CM | POA: Insufficient documentation

## 2021-03-10 DIAGNOSIS — Z7984 Long term (current) use of oral hypoglycemic drugs: Secondary | ICD-10-CM | POA: Diagnosis not present

## 2021-03-10 DIAGNOSIS — G8918 Other acute postprocedural pain: Secondary | ICD-10-CM | POA: Diagnosis not present

## 2021-03-10 DIAGNOSIS — E669 Obesity, unspecified: Secondary | ICD-10-CM | POA: Insufficient documentation

## 2021-03-10 DIAGNOSIS — C50912 Malignant neoplasm of unspecified site of left female breast: Secondary | ICD-10-CM | POA: Diagnosis not present

## 2021-03-10 DIAGNOSIS — E1165 Type 2 diabetes mellitus with hyperglycemia: Secondary | ICD-10-CM

## 2021-03-10 DIAGNOSIS — K219 Gastro-esophageal reflux disease without esophagitis: Secondary | ICD-10-CM | POA: Diagnosis not present

## 2021-03-10 DIAGNOSIS — E119 Type 2 diabetes mellitus without complications: Secondary | ICD-10-CM

## 2021-03-10 DIAGNOSIS — R569 Unspecified convulsions: Secondary | ICD-10-CM | POA: Diagnosis not present

## 2021-03-10 HISTORY — PX: MASTECTOMY W/ SENTINEL NODE BIOPSY: SHX2001

## 2021-03-10 HISTORY — DX: Other complications of anesthesia, initial encounter: T88.59XA

## 2021-03-10 LAB — GLUCOSE, CAPILLARY
Glucose-Capillary: 92 mg/dL (ref 70–99)
Glucose-Capillary: 91 mg/dL (ref 70–99)
Glucose-Capillary: 182 mg/dL — ABNORMAL HIGH (ref 70–99)

## 2021-03-10 SURGERY — MASTECTOMY WITH SENTINEL LYMPH NODE BIOPSY
Anesthesia: General | Site: Breast | Laterality: Left

## 2021-03-10 MED ORDER — IBUPROFEN 800 MG PO TABS: 800.0000 mg | ORAL_TABLET | Freq: Three times a day (TID) | ORAL | 0 refills | Status: DC | PRN

## 2021-03-10 MED ORDER — MIDAZOLAM HCL 2 MG/2ML IJ SOLN
INTRAMUSCULAR | Status: AC
  Filled 2021-03-10: qty 2

## 2021-03-10 MED ORDER — PROPOFOL 10 MG/ML IV BOLUS
INTRAVENOUS | Status: DC | PRN
  Administered 2021-03-10: 150 mg via INTRAVENOUS

## 2021-03-10 MED ORDER — FENTANYL CITRATE (PF) 100 MCG/2ML IJ SOLN
100.0000 ug | Freq: Once | INTRAMUSCULAR | Status: AC
  Administered 2021-03-10: 100 ug via INTRAVENOUS

## 2021-03-10 MED ORDER — OXYCODONE HCL 5 MG/5ML PO SOLN: 5.0000 mg | Freq: Once | ORAL | Status: DC | PRN

## 2021-03-10 MED ORDER — BUPIVACAINE-EPINEPHRINE (PF) 0.25% -1:200000 IJ SOLN
INTRAMUSCULAR | Status: AC
  Filled 2021-03-10: qty 90

## 2021-03-10 MED ORDER — PRAVASTATIN SODIUM 40 MG PO TABS
40.0000 mg | ORAL_TABLET | Freq: Every day | ORAL | Status: DC
  Filled 2021-03-10: qty 1

## 2021-03-10 MED ORDER — LACTATED RINGERS IV SOLN: INTRAVENOUS | Status: DC

## 2021-03-10 MED ORDER — ONDANSETRON HCL 4 MG/2ML IJ SOLN
INTRAMUSCULAR | Status: DC | PRN
  Administered 2021-03-10: 4 mg via INTRAVENOUS

## 2021-03-10 MED ORDER — ACETAMINOPHEN 325 MG PO TABS
650.0000 mg | ORAL_TABLET | ORAL | Status: DC | PRN
Start: 2021-03-10 — End: 2021-03-11

## 2021-03-10 MED ORDER — ROPIVACAINE HCL 5 MG/ML IJ SOLN
INTRAMUSCULAR | Status: DC | PRN
  Administered 2021-03-10: 30 mL via PERINEURAL

## 2021-03-10 MED ORDER — AMISULPRIDE (ANTIEMETIC) 5 MG/2ML IV SOLN: 10.0000 mg | Freq: Once | INTRAVENOUS | Status: DC | PRN

## 2021-03-10 MED ORDER — ACETAMINOPHEN 500 MG PO TABS
ORAL_TABLET | ORAL | Status: AC
  Filled 2021-03-10: qty 2

## 2021-03-10 MED ORDER — SODIUM CHLORIDE 0.9 % IV SOLN: INTRAVENOUS | Status: DC | PRN

## 2021-03-10 MED ORDER — FENTANYL CITRATE (PF) 100 MCG/2ML IJ SOLN
INTRAMUSCULAR | Status: DC | PRN
  Administered 2021-03-10: 100 ug via INTRAVENOUS

## 2021-03-10 MED ORDER — IRBESARTAN 300 MG PO TABS
300.0000 mg | ORAL_TABLET | Freq: Every day | ORAL | Status: DC
  Filled 2021-03-10: qty 1

## 2021-03-10 MED ORDER — CEFAZOLIN SODIUM-DEXTROSE 2-4 GM/100ML-% IV SOLN
2.0000 g | INTRAVENOUS | Status: AC
  Administered 2021-03-10: 2 g via INTRAVENOUS

## 2021-03-10 MED ORDER — EPHEDRINE SULFATE (PRESSORS) 50 MG/ML IJ SOLN
INTRAMUSCULAR | Status: DC | PRN
  Administered 2021-03-10: 10 mg via INTRAVENOUS

## 2021-03-10 MED ORDER — ACETAMINOPHEN 500 MG PO TABS
1000.0000 mg | ORAL_TABLET | ORAL | Status: AC
  Administered 2021-03-10: 1000 mg via ORAL

## 2021-03-10 MED ORDER — REPAGLINIDE 1 MG PO TABS
0.5000 mg | ORAL_TABLET | Freq: Three times a day (TID) | ORAL | Status: DC
  Administered 2021-03-10: 0.5 mg via ORAL
  Filled 2021-03-10: qty 1
  Filled 2021-03-10: qty 0.5

## 2021-03-10 MED ORDER — DEXAMETHASONE SODIUM PHOSPHATE 10 MG/ML IJ SOLN
INTRAMUSCULAR | Status: DC | PRN
  Administered 2021-03-10: 10 mg via INTRAVENOUS

## 2021-03-10 MED ORDER — SODIUM CHLORIDE 0.9 % IV SOLN: INTRAVENOUS | Status: DC

## 2021-03-10 MED ORDER — LIDOCAINE HCL (CARDIAC) PF 100 MG/5ML IV SOSY
PREFILLED_SYRINGE | INTRAVENOUS | Status: DC | PRN
  Administered 2021-03-10: 50 mg via INTRAVENOUS

## 2021-03-10 MED ORDER — HYDRALAZINE HCL 50 MG PO TABS
100.0000 mg | ORAL_TABLET | Freq: Three times a day (TID) | ORAL | Status: DC
Start: 2021-03-10 — End: 2021-03-11
  Administered 2021-03-10: 100 mg via ORAL
  Filled 2021-03-10: qty 2

## 2021-03-10 MED ORDER — HYDRALAZINE HCL 20 MG/ML IJ SOLN
INTRAMUSCULAR | Status: AC
  Filled 2021-03-10: qty 1

## 2021-03-10 MED ORDER — OXYCODONE HCL 5 MG PO TABS
5.0000 mg | ORAL_TABLET | ORAL | Status: DC | PRN
  Administered 2021-03-10 (×2): 5 mg via ORAL
  Administered 2021-03-11: 10 mg via ORAL
  Filled 2021-03-10: qty 1
  Filled 2021-03-10: qty 2
  Filled 2021-03-10: qty 1

## 2021-03-10 MED ORDER — LIDOCAINE 2% (20 MG/ML) 5 ML SYRINGE
INTRAMUSCULAR | Status: AC
  Filled 2021-03-10: qty 5

## 2021-03-10 MED ORDER — EPHEDRINE 5 MG/ML INJ
INTRAVENOUS | Status: AC
  Filled 2021-03-10: qty 5

## 2021-03-10 MED ORDER — MIDAZOLAM HCL 2 MG/2ML IJ SOLN
2.0000 mg | Freq: Once | INTRAMUSCULAR | Status: AC
  Administered 2021-03-10: 2 mg via INTRAVENOUS

## 2021-03-10 MED ORDER — DEXAMETHASONE SODIUM PHOSPHATE 10 MG/ML IJ SOLN
INTRAMUSCULAR | Status: AC
  Filled 2021-03-10: qty 1

## 2021-03-10 MED ORDER — AMLODIPINE BESYLATE 5 MG PO TABS
5.0000 mg | ORAL_TABLET | Freq: Every day | ORAL | Status: DC
  Filled 2021-03-10: qty 1

## 2021-03-10 MED ORDER — HYDROMORPHONE HCL 1 MG/ML IJ SOLN: 0.2500 mg | INTRAMUSCULAR | Status: DC | PRN

## 2021-03-10 MED ORDER — CHLORHEXIDINE GLUCONATE CLOTH 2 % EX PADS: 6.0000 | MEDICATED_PAD | Freq: Once | CUTANEOUS | Status: DC

## 2021-03-10 MED ORDER — SODIUM CHLORIDE 0.9 % IV SOLN: 250.0000 mL | INTRAVENOUS | Status: DC | PRN

## 2021-03-10 MED ORDER — CEFAZOLIN SODIUM-DEXTROSE 2-4 GM/100ML-% IV SOLN
INTRAVENOUS | Status: AC
  Filled 2021-03-10: qty 100

## 2021-03-10 MED ORDER — FENTANYL CITRATE (PF) 100 MCG/2ML IJ SOLN
INTRAMUSCULAR | Status: AC
  Filled 2021-03-10: qty 2

## 2021-03-10 MED ORDER — PROPOFOL 10 MG/ML IV BOLUS
INTRAVENOUS | Status: AC
Start: 2021-03-10 — End: ?
  Filled 2021-03-10: qty 20

## 2021-03-10 MED ORDER — CARVEDILOL 12.5 MG PO TABS
12.5000 mg | ORAL_TABLET | Freq: Two times a day (BID) | ORAL | Status: DC
  Administered 2021-03-10: 12.5 mg via ORAL
  Filled 2021-03-10: qty 1

## 2021-03-10 MED ORDER — MAGTRACE LYMPHATIC TRACER
INTRAMUSCULAR | Status: DC | PRN
  Administered 2021-03-10: 2 mL via INTRAMUSCULAR

## 2021-03-10 MED ORDER — HYDRALAZINE HCL 20 MG/ML IJ SOLN
10.0000 mg | Freq: Once | INTRAMUSCULAR | Status: AC
Start: 2021-03-10 — End: 2021-03-10
  Administered 2021-03-10: 10 mg via INTRAVENOUS

## 2021-03-10 MED ORDER — MEPERIDINE HCL 25 MG/ML IJ SOLN: 6.2500 mg | INTRAMUSCULAR | Status: DC | PRN

## 2021-03-10 MED ORDER — PHENYLEPHRINE HCL (PRESSORS) 10 MG/ML IV SOLN
INTRAVENOUS | Status: DC | PRN
  Administered 2021-03-10: 80 ug via INTRAVENOUS

## 2021-03-10 MED ORDER — ACETAMINOPHEN 325 MG RE SUPP: 650.0000 mg | RECTAL | Status: DC | PRN

## 2021-03-10 MED ORDER — OXYCODONE HCL 5 MG PO TABS: 5.0000 mg | ORAL_TABLET | Freq: Once | ORAL | Status: DC | PRN

## 2021-03-10 MED ORDER — INSULIN ASPART 100 UNIT/ML IJ SOLN: 0.0000 [IU] | Freq: Three times a day (TID) | INTRAMUSCULAR | Status: DC

## 2021-03-10 MED ORDER — FENTANYL CITRATE (PF) 100 MCG/2ML IJ SOLN: 25.0000 ug | INTRAMUSCULAR | Status: DC | PRN

## 2021-03-10 MED ORDER — PROMETHAZINE HCL 25 MG/ML IJ SOLN: 6.2500 mg | INTRAMUSCULAR | Status: DC | PRN

## 2021-03-10 MED ORDER — SODIUM CHLORIDE 0.9% FLUSH: 3.0000 mL | INTRAVENOUS | Status: DC | PRN

## 2021-03-10 MED ORDER — SODIUM CHLORIDE 0.9% FLUSH: 3.0000 mL | Freq: Two times a day (BID) | INTRAVENOUS | Status: DC

## 2021-03-10 MED ORDER — METFORMIN HCL 500 MG PO TABS
1000.0000 mg | ORAL_TABLET | Freq: Two times a day (BID) | ORAL | Status: DC
  Administered 2021-03-10: 1000 mg via ORAL
  Filled 2021-03-10: qty 2

## 2021-03-10 MED ORDER — PHENYLEPHRINE 40 MCG/ML (10ML) SYRINGE FOR IV PUSH (FOR BLOOD PRESSURE SUPPORT)
PREFILLED_SYRINGE | INTRAVENOUS | Status: AC
  Filled 2021-03-10: qty 10

## 2021-03-10 MED ORDER — ONDANSETRON HCL 4 MG/2ML IJ SOLN
INTRAMUSCULAR | Status: AC
  Filled 2021-03-10: qty 2

## 2021-03-10 MED ORDER — OXYCODONE HCL 5 MG PO TABS: 5.0000 mg | ORAL_TABLET | Freq: Four times a day (QID) | ORAL | 0 refills | Status: DC | PRN

## 2021-03-10 SURGICAL SUPPLY — 72 items
ADH SKN CLS APL DERMABOND .7 (GAUZE/BANDAGES/DRESSINGS) ×2
APL PRP STRL LF DISP 70% ISPRP (MISCELLANEOUS) ×1
APL SKNCLS STERI-STRIP NONHPOA (GAUZE/BANDAGES/DRESSINGS)
APPLIER CLIP 11 MED OPEN (CLIP)
APPLIER CLIP 9.375 MED OPEN (MISCELLANEOUS) ×2
APR CLP MED 11 20 MLT OPN (CLIP)
APR CLP MED 9.3 20 MLT OPN (MISCELLANEOUS) ×1
BAG DECANTER FOR FLEXI CONT (MISCELLANEOUS) ×1 IMPLANT
BENZOIN TINCTURE PRP APPL 2/3 (GAUZE/BANDAGES/DRESSINGS) IMPLANT
BINDER BREAST LRG (GAUZE/BANDAGES/DRESSINGS) IMPLANT
BINDER BREAST MEDIUM (GAUZE/BANDAGES/DRESSINGS) IMPLANT
BINDER BREAST XLRG (GAUZE/BANDAGES/DRESSINGS) IMPLANT
BINDER BREAST XXLRG (GAUZE/BANDAGES/DRESSINGS) ×1 IMPLANT
BLADE HEX COATED 2.75 (ELECTRODE) ×2 IMPLANT
BLADE SURG 10 STRL SS (BLADE) ×2 IMPLANT
BLADE SURG 15 STRL LF DISP TIS (BLADE) ×1 IMPLANT
BLADE SURG 15 STRL SS (BLADE) ×2
CANISTER SUCT 1200ML W/VALVE (MISCELLANEOUS) ×2 IMPLANT
CHLORAPREP W/TINT 26 (MISCELLANEOUS) ×2 IMPLANT
CLIP APPLIE 11 MED OPEN (CLIP) IMPLANT
CLIP APPLIE 9.375 MED OPEN (MISCELLANEOUS) ×1 IMPLANT
COVER BACK TABLE 60X90IN (DRAPES) ×2 IMPLANT
COVER MAYO STAND STRL (DRAPES) ×2 IMPLANT
COVER PROBE W GEL 5X96 (DRAPES) ×2 IMPLANT
DERMABOND ADVANCED (GAUZE/BANDAGES/DRESSINGS) ×2
DERMABOND ADVANCED .7 DNX12 (GAUZE/BANDAGES/DRESSINGS) ×2 IMPLANT
DRAIN CHANNEL 19F RND (DRAIN) ×2 IMPLANT
DRAPE LAPAROSCOPIC ABDOMINAL (DRAPES) ×2 IMPLANT
DRAPE UTILITY XL STRL (DRAPES) ×1 IMPLANT
DRSG PAD ABDOMINAL 8X10 ST (GAUZE/BANDAGES/DRESSINGS) ×1 IMPLANT
DRSG TEGADERM 4X10 (GAUZE/BANDAGES/DRESSINGS) ×2 IMPLANT
ELECT REM PT RETURN 9FT ADLT (ELECTROSURGICAL) ×2
ELECTRODE REM PT RTRN 9FT ADLT (ELECTROSURGICAL) ×1 IMPLANT
EVACUATOR SILICONE 100CC (DRAIN) ×2 IMPLANT
GAUZE SPONGE 4X4 12PLY STRL LF (GAUZE/BANDAGES/DRESSINGS) IMPLANT
GLOVE SRG 8 PF TXTR STRL LF DI (GLOVE) ×1 IMPLANT
GLOVE SURG LTX SZ8 (GLOVE) ×2 IMPLANT
GLOVE SURG UNDER POLY LF SZ8 (GLOVE) ×4
GOWN STRL REUS W/ TWL LRG LVL3 (GOWN DISPOSABLE) ×2 IMPLANT
GOWN STRL REUS W/ TWL XL LVL3 (GOWN DISPOSABLE) ×1 IMPLANT
GOWN STRL REUS W/TWL LRG LVL3 (GOWN DISPOSABLE)
GOWN STRL REUS W/TWL XL LVL3 (GOWN DISPOSABLE) ×4
HEMOSTAT ARISTA ABSORB 3G PWDR (HEMOSTASIS) ×3 IMPLANT
LIGHT WAVEGUIDE WIDE FLAT (MISCELLANEOUS) IMPLANT
NDL HYPO 25X1 1.5 SAFETY (NEEDLE) ×2 IMPLANT
NDL SAFETY ECLIPSE 18X1.5 (NEEDLE) ×1 IMPLANT
NEEDLE HYPO 18GX1.5 SHARP (NEEDLE) ×2
NEEDLE HYPO 25X1 1.5 SAFETY (NEEDLE) ×4 IMPLANT
NS IRRIG 1000ML POUR BTL (IV SOLUTION) ×1 IMPLANT
PACK BASIN DAY SURGERY FS (CUSTOM PROCEDURE TRAY) ×2 IMPLANT
PENCIL SMOKE EVACUATOR (MISCELLANEOUS) ×2 IMPLANT
PIN SAFETY STERILE (MISCELLANEOUS) ×2 IMPLANT
SLEEVE SCD COMPRESS KNEE MED (STOCKING) ×2 IMPLANT
SPIKE FLUID TRANSFER (MISCELLANEOUS) IMPLANT
SPONGE T-LAP 18X18 ~~LOC~~+RFID (SPONGE) ×3 IMPLANT
SPONGE T-LAP 4X18 ~~LOC~~+RFID (SPONGE) IMPLANT
STAPLER VISISTAT 35W (STAPLE) ×2 IMPLANT
STRIP CLOSURE SKIN 1/2X4 (GAUZE/BANDAGES/DRESSINGS) IMPLANT
SUT CHROMIC 3 0 SH 27 (SUTURE) IMPLANT
SUT ETHILON 2 0 FS 18 (SUTURE) ×2 IMPLANT
SUT MNCRL AB 3-0 PS2 18 (SUTURE) ×2 IMPLANT
SUT MON AB 4-0 PC3 18 (SUTURE) IMPLANT
SUT SILK 2 0 PERMA HAND 18 BK (SUTURE) ×2 IMPLANT
SUT SILK 2 0 SH (SUTURE) ×1 IMPLANT
SUT VIC AB 3-0 54X BRD REEL (SUTURE) ×1 IMPLANT
SUT VIC AB 3-0 BRD 54 (SUTURE) ×2
SUT VICRYL 3-0 CR8 SH (SUTURE) ×3 IMPLANT
SYR CONTROL 10ML LL (SYRINGE) ×3 IMPLANT
TOWEL GREEN STERILE FF (TOWEL DISPOSABLE) ×4 IMPLANT
TRACER MAGTRACE VIAL (MISCELLANEOUS) ×1 IMPLANT
TUBE CONNECTING 20X1/4 (TUBING) ×2 IMPLANT
YANKAUER SUCT BULB TIP NO VENT (SUCTIONS) ×2 IMPLANT

## 2021-03-10 NOTE — H&P (Signed)
Chief Complaint: Breast Cancer   History of Present Illness: Carrie Singh is a 66 y.o. female who is seen today as an office consultation at the request of Dr. Delice Bison for evaluation of Breast Cancer .   Patient presents for evaluation of stage I left breast cancer upper outer quadrant. She has a history of right breast cancer treated initially back in 2009 with breast conserving surgery. She had additional cancer in 2018 and right breast underwent simple mastectomy. She had symptom mapping both times and radiation with her lumpectomy. 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 Profile pending for markers. She is here today with her husband to discuss surgical treatment options. She denies any history of breast pain or nipple discharge prior to this.  Review of Systems: A complete review of systems was obtained from the patient. I have reviewed this information and discussed as appropriate with the patient. See HPI as well for other ROS.    Medical History: Past Medical History:  Diagnosis Date   Breast cancer (CMS-HCC) 2009  s/p lumpectomy, chemotherapy, radiation therapy   Chronic renal failure, stage 3 (moderate)   GERD (gastroesophageal reflux disease)   Hyperlipemia   Hypertension   Shingles 2022   Type 2 diabetes mellitus (CMS-HCC)   Patient Active Problem List  Diagnosis   Diabetes mellitus with stage 2 chronic kidney disease (CMS-HCC)   Past Surgical History:  Procedure Laterality Date   foot surgery Right  bunionectomy   HYSTERECTOMY VAGINAL   MASTECTOMY PARTIAL / LUMPECTOMY Right    No Known Allergies  Current Outpatient Medications on File Prior to Visit  Medication Sig Dispense Refill   amLODIPine (NORVASC) 2.5 MG tablet Take 2.5 mg by mouth once daily   anastrozole (ARIMIDEX) 1 mg tablet Take 1 mg by mouth once daily   calcium carbonate (CALCIUM 600  ORAL) Take by mouth Calcium 600 mg + vitamin d 20 mcg daily   carvediloL (COREG) 12.5 MG tablet Take 12.5 mg by mouth 2 (two) times daily with meals   irbesartan (AVAPRO) 300 MG tablet Take 300 mg by mouth once daily   metFORMIN (GLUCOPHAGE) 1000 MG tablet TAKE 1 TABLET BY MOUTH TWICE DAILY WITH MEALS 180 tablet 1   multivitamin tablet Take 1 tablet by mouth once daily   pravastatin (PRAVACHOL) 40 MG tablet Take 40 mg by mouth nightly   repaglinide (PRANDIN) 0.5 MG tablet Take 1 tablet (0.5 mg total) by mouth 3 (three) times daily before meals 270 tablet 3   No current facility-administered medications on file prior to visit.   Family History  Problem Relation Age of Onset   Stroke Mother   Prostate cancer Father   High blood pressure (Hypertension) Brother   Diabetes Brother   Prostate cancer Brother    Social History   Tobacco Use  Smoking Status Former   Types: Cigarettes  Smokeless Tobacco Never    Social History   Socioeconomic History   Marital status: Married  Tobacco Use   Smoking status: Former  Types: Cigarettes   Smokeless tobacco: Never  Substance and Sexual Activity   Alcohol use: Not Currently   Drug use: Never   Objective:   Vitals:  02/09/21 1020  BP: 122/80  Pulse: 107  Temp: 36.8 C (98.3 F)  SpO2: 98%  Weight: 95.4 kg (210 lb 6.4 oz)  Height: 165.1 cm (5\' 5" )  Body mass index is 35.01 kg/m.  Physical Exam Constitutional:  Appearance: Normal appearance.  Eyes:  General: No scleral icterus. Pupils: Pupils are equal, round, and reactive to light.  Cardiovascular:  Rate and Rhythm: Normal rate.  Pulmonary:  Effort: Pulmonary effort is normal.  Breath sounds: No stridor.  Chest:   Musculoskeletal:  General: Normal range of motion.  Cervical back: Normal range of motion.  Lymphadenopathy:  Upper Body:  Right upper body: No supraclavicular or axillary adenopathy.  Left upper body: No supraclavicular or axillary adenopathy.   Skin: General: Skin is warm and dry.  Neurological:  General: No focal deficit present.  Mental Status: She is alert and oriented to person, place, and time.  Psychiatric:  Mood and Affect: Mood normal.  Behavior: Behavior normal.     Labs, Imaging and Diagnostic Testing: CLINICAL DATA: 66 year old female presenting as a recall from screening for possible left breast mass.   EXAM: DIGITAL DIAGNOSTIC UNILATERAL LEFT MAMMOGRAM WITH TOMOSYNTHESIS AND CAD; ULTRASOUND LEFT BREAST LIMITED   TECHNIQUE: Left digital diagnostic mammography and breast tomosynthesis was performed. The images were evaluated with computer-aided detection.; Targeted ultrasound examination of the left breast was performed.   COMPARISON: Previous exam(s).   ACR Breast Density Category b: There are scattered areas of fibroglandular density.   FINDINGS: Mammogram:   Spot compression tomosynthesis views of the left breast were performed demonstrating persistence of a small irregular mass in the slightly outer left breast.   Ultrasound:   Targeted ultrasound performed in the left breast at 3 o'clock 2 cm from the nipple demonstrating an irregular hypoechoic mass measuring 0.6 x 0.4 x 0.5 cm. This corresponds to the mammographic finding.   Targeted ultrasound the left axilla demonstrates normal lymph nodes.   IMPRESSION: Suspicious mass in the left breast at 3 o'clock measuring 0.6 cm.   RECOMMENDATION: Ultrasound-guided core needle biopsy of the left breast mass at 3 o'clock.   I have discussed the findings and recommendations with the patient who agrees to proceed with biopsy. The patient will be scheduled for the biopsy appointment prior to leaving the office today.   BI-RADS CATEGORY 4: Suspicious.     Electronically Signed By: Audie Pinto M.D. On: 02/02/2021 12:11  Diagnosis Breast, left, needle core biopsy, 3 ocl, 2cmfn , left breast - INVASIVE DUCTAL CARCINOMA WITH MUCINOUS  FEATURES, GRADE 2 TO 3. - THE TUMOR INVOLVES 3 OF 3 BIOPSY FRAGMENTS. - MAXIMUM EXTENT IS 0.5 CM. - SEE COMMENT. Microscopic Comment Dr.Picklesimer agrees with the diagnosis. Material will be taken for receptor studies and the results will be reported in an addendum. The results were conveyed to [facility] at the [place] at 11:45 a.m.on 02/05/21. (MJ:kh 02/05/21) [place]  Assessment and Plan:   Diagnoses and all orders for this visit:  Malignant neoplasm of upper-outer quadrant of left breast in female, estrogen receptor positive (CMS-HCC)    Patient with a history of stage I right breast cancer initially in 2009 and then again in 2018. She initially had breast conserving surgery followed by radiation therapy due to mastectomy in 2018. She has now developed a stage I left breast cancer upper quadrant of the left breast. She is opted for left simple mastectomy with sentinel lymph node mapping. She has no interest in breast conserving surgery nor reconstruction. We discussed pros and cons of mastectomy as well as complications of bleeding, infection, flap necrosis, cosmetic deformity, excessive skin, numbness, lymphedema, the need for physical therapy, the need for additional surgery,  and the need for potential other treatments. Discussed numbness as well. We also discussed breast conserving surgery but she has no interest in that. She will see medical oncology later this week and depending on final pathology may not need radiation therapy which I discussed with her today.

## 2021-03-10 NOTE — Anesthesia Preprocedure Evaluation (Signed)
Anesthesia Evaluation  Patient identified by MRN, date of birth, ID band Patient awake    Reviewed: Allergy & Precautions, NPO status , Patient's Chart, lab work & pertinent test results  Airway Mallampati: I  TM Distance: >3 FB Neck ROM: full    Dental  (+) Teeth Intact, Dental Advidsory Given   Pulmonary    breath sounds clear to auscultation       Cardiovascular hypertension, Pt. on medications  Rhythm:regular Rate:Normal     Neuro/Psych  Headaches, Seizures -,  PSYCHIATRIC DISORDERS Depression    GI/Hepatic Neg liver ROS, GERD  Medicated and Controlled,  Endo/Other  diabetes, Type 2, Oral Hypoglycemic Agents  Renal/GU Renal InsufficiencyRenal disease  negative genitourinary   Musculoskeletal negative musculoskeletal ROS (+)   Abdominal (+) + obese,   Peds negative pediatric ROS (+)  Hematology negative hematology ROS (+)   Anesthesia Other Findings Breast Cancer  Reproductive/Obstetrics negative OB ROS                             Lab Results  Component Value Date   WBC 3.9 01/12/2021   HGB 12.5 01/12/2021   HCT 38.4 01/12/2021   MCV 91.9 01/12/2021   PLT 250 01/12/2021   Lab Results  Component Value Date   CREATININE 1.07 (H) 03/05/2021   BUN 14 03/05/2021   NA 138 03/05/2021   K 3.6 03/05/2021   CL 104 03/05/2021   CO2 26 03/05/2021   Lab Results  Component Value Date   INR 1.03 01/22/2018    EKG: normal sinus rhythm, RBBB.  Echo: - Left ventricle: The cavity size was normal. Systolic function was   normal. The estimated ejection fraction was in the range of 50%   to 55%. Mild to moderate Inferior and posterior wall hypokinesis.   Doppler parameters are consistent with abnormal left ventricular   relaxation (grade 1 diastolic dysfunction). - Left atrium: The atrium was mildly dilated. - Right ventricle: Systolic function was normal. - Pulmonary arteries: Systolic  pressure was within the normal   range.  Anesthesia Physical  Anesthesia Plan  ASA: 3  Anesthesia Plan: General   Post-op Pain Management: GA combined w/ Regional for post-op pain and Regional block*   Induction: Intravenous  PONV Risk Score and Plan: 3 and Ondansetron, Dexamethasone, Midazolam and Treatment may vary due to age or medical condition  Airway Management Planned: LMA  Additional Equipment:   Intra-op Plan:   Post-operative Plan: Extubation in OR  Informed Consent: I have reviewed the patients History and Physical, chart, labs and discussed the procedure including the risks, benefits and alternatives for the proposed anesthesia with the patient or authorized representative who has indicated his/her understanding and acceptance.     Dental advisory given and Dental Advisory Given  Plan Discussed with: CRNA, Anesthesiologist and Surgeon  Anesthesia Plan Comments:         Anesthesia Quick Evaluation

## 2021-03-10 NOTE — Anesthesia Procedure Notes (Signed)
Procedure Name: LMA Insertion Date/Time: 03/10/2021 8:48 AM Performed by: Jonna Munro, CRNA Pre-anesthesia Checklist: Patient identified, Emergency Drugs available, Suction available, Patient being monitored and Timeout performed Patient Re-evaluated:Patient Re-evaluated prior to induction Oxygen Delivery Method: Circle system utilized Preoxygenation: Pre-oxygenation with 100% oxygen Induction Type: IV induction LMA: LMA inserted LMA Size: 4.0 Number of attempts: 1 Placement Confirmation: positive ETCO2 and breath sounds checked- equal and bilateral Tube secured with: Tape Dental Injury: Teeth and Oropharynx as per pre-operative assessment

## 2021-03-10 NOTE — Progress Notes (Signed)
Assisted Dr. Miller with left, ultrasound guided, pectoralis block. Side rails up, monitors on throughout procedure. See vital signs in flow sheet. Tolerated Procedure well. 

## 2021-03-10 NOTE — Op Note (Signed)
Preoperative diagnosis: Stage I left breast cancer upper outer quadrant  Postoperative diagnosis: Same  Procedure: Left simple mastectomy with left axillary sentinel lymph node mapping with mag trace  Surgeon: Erroll Luna, MD  Anesthesia: General with pectoral block  EBL: 30 cc  Specimen: Left breast to pathology with 4 left axillary sentinel nodes hot  Drains: 19 French round  IV fluids: Per anesthesia record  Indications for procedure: Patient presents for left sympathectomy at being diagnosed with stage I left breast cancer.  She has a history of right breast cancer treated with initial breast conserving surgery over 10 years ago and subsequent mastectomy back in 2018 for recurrence.  She has developed a new breast cancer on the left.  She is opted for left simple mastectomy for symmetry purposes.The surgical and non surgical options have been discussed with the patient.  Risks of surgery include bleeding,  Infection,  Flap necrosis,  Tissue loss,  Chronic pain, death, Numbness,  And the need for additional procedures.  Reconstruction options also have been discussed with the patient as well.  The patient agrees to proceed. Sentinel lymph node mapping and dissection has been discussed with the patient.  Risk of bleeding,  Infection,  Seroma formation,  Additional procedures,,  Shoulder weakness , lymphedema ,shoulder stiffness,  Nerve and blood vessel injury and reaction to the mapping dyes have been discussed.  Alternatives to surgery have been discussed with the patient.  The patient agrees to proceed.     Description of procedure: The patient was met in the holding area and questions were answered.  She underwent pectoral block on the left.  She was then taken back to the operating room.  She was placed supine upon the OR table.  After induction of general anesthesia, left breast was prepped and draped in sterile fashion and timeout performed.  Proper patient, site and procedure  verified.  2 cc of mag trace were injected of the nipple and massaged for 5 minutes.  After this curvilinear incisions were made above and below the nipple areolar complex.  A superior skin flap was taken to the clavicle and inferior skin flap taken to the inferior mammary fold.  The breast was then dissected in a medial to lateral fashion off the pectoralis fashion to include the pectoralis fashion to the lateral attachments were identified and divided with cautery.  Small bleeding vessels were controlled with clips and cautery.  The mag trace probe was used.  A good signal was obtained to the level 1 basin.  There were 4 nodes that it picked up as tracer that were all removed.  Background counts approached baseline.  Hemostasis achieved with cautery and clips as needed.  This was irrigated.  Once we ensured it was dry a 19 round drain was placed through a single stab incision to the inferior flap and secured to the skin with 2-0 nylon.  Arista was placed throughout the cavity.  Good hemostasis achieved.  We then closed the cavity with 3-0 Vicryl and 4-0 Monocryl.  All counts were found to be correct.  Dermabond was applied.  Breast binder placed.  Drain placed to bulb suction.  Patient was then awoke extubated taken to recovery in satisfactory condition.

## 2021-03-10 NOTE — Discharge Instructions (Signed)
[facility] [place] [phone]  MASTECTOMY: POST OP INSTRUCTIONS  Always review your discharge instruction sheet given to you by the facility where your surgery was performed. IF YOU HAVE DISABILITY OR FAMILY LEAVE FORMS, YOU MUST BRING THEM TO THE OFFICE FOR PROCESSING.   DO NOT GIVE THEM TO YOUR DOCTOR. A prescription for pain medication may be given to you upon discharge.  Take your pain medication as prescribed, if needed.  If narcotic pain medicine is not needed, then you may take acetaminophen (Tylenol) or ibuprofen (Advil) as needed. Take your usually prescribed medications unless otherwise directed. If you need a refill on your pain medication, please contact your pharmacy.  They will contact our office to request authorization.  Prescriptions will not be filled after 5pm or on week-ends. You should follow a light diet the first few days after arrival home, such as soup and crackers, etc.  Resume your normal diet the day after surgery. Most patients will experience some swelling and bruising on the chest and underarm.  Ice packs will help.  Swelling and bruising can take several days to resolve.  It is common to experience some constipation if taking pain medication after surgery.  Increasing fluid intake and taking a stool softener (such as Colace) will usually help or prevent this problem from occurring.  A mild laxative (Milk of Magnesia or Miralax) should be taken according to package instructions if there are no bowel movements after 48 hours. Unless discharge instructions indicate otherwise, leave your bandage dry and in place until your next appointment in 3-5 days.  You may take a limited sponge bath.  No tube baths or showers until the drains are removed.  You may have steri-strips (small skin tapes) in place directly over the incision.  These strips should be left on the skin for 7-10 days.  If your surgeon used skin glue on the incision, you may shower in 24 hours.   The glue will flake off over the next 2-3 weeks.  Any sutures or staples will be removed at the office during your follow-up visit. DRAINS:  If you have drains in place, it is important to keep a list of the amount of drainage produced each day in your drains.  Before leaving the hospital, you should be instructed on drain care.  Call our office if you have any questions about your drains. ACTIVITIES:  You may resume regular (light) daily activities beginning the next day--such as daily self-care, walking, climbing stairs--gradually increasing activities as tolerated.  You may have sexual intercourse when it is comfortable.  Refrain from any heavy lifting or straining until approved by your doctor. You may drive when you are no longer taking prescription pain medication, you can comfortably wear a seatbelt, and you can safely maneuver your car and apply brakes. RETURN TO WORK:  __________________________________________________________ You should see your doctor in the office for a follow-up appointment approximately 3-5 days after your surgery.  Your doctor's nurse will typically make your follow-up appointment when she calls you with your pathology report.  Expect your pathology report 2-3 business days after your surgery.  You may call to check if you do not hear from us after three days.   OTHER INSTRUCTIONS: ______________________________________________________________________________________________ ____________________________________________________________________________________________ WHEN TO CALL YOUR DOCTOR: Fever over 101.0 Nausea and/or vomiting Extreme swelling or bruising Continued bleeding from incision. Increased pain, redness, or drainage from the incision. The clinic staff is available to answer your questions during regular business hours.  Please don't hesitate   to call and ask to speak to one of the nurses for clinical concerns.  If you have a medical emergency, go to the  nearest emergency room or call 911.  A surgeon from [facility] is always on call at the hospital. [address], [address], [place], [place]  [address] ? [address] [address], [place], [place]   [address] [phone] ? [phone] ? FAX [phone] Web site: [email or web address]  

## 2021-03-10 NOTE — Anesthesia Procedure Notes (Signed)
Anesthesia Regional Block: Pectoralis block   Pre-Anesthetic Checklist: , timeout performed,  Correct Patient, Correct Site, Correct Laterality,  Correct Procedure, Correct Position, site marked,  Risks and benefits discussed,  Surgical consent,  Pre-op evaluation,  At surgeon's request and post-op pain management  Laterality: Left  Prep: chloraprep       Needles:  Injection technique: Single-shot  Needle Type: Stimiplex     Needle Length: 9cm  Needle Gauge: 21     Additional Needles:   Procedures:,,,, ultrasound used (permanent image in chart),,    Narrative:  Start time: 03/10/2021 8:30 AM End time: 03/10/2021 8:35 AM Injection made incrementally with aspirations every 5 mL.  Performed by: Personally  Anesthesiologist: Lynda Rainwater, MD

## 2021-03-10 NOTE — Transfer of Care (Signed)
Immediate Anesthesia Transfer of Care Note  Patient: Carrie Singh  Procedure(s) Performed: LEFT MASTECTOMY WITH LEFT SENTINEL LYMPH NODE BIOPSY (Left: Breast)  Patient Location: PACU  Anesthesia Type:General  Level of Consciousness: awake, alert , oriented and patient cooperative  Airway & Oxygen Therapy: Patient Spontanous Breathing and Patient connected to face mask oxygen  Post-op Assessment: Report given to RN and Post -op Vital signs reviewed and stable  Post vital signs: Reviewed and stable  Last Vitals:  Vitals Value Taken Time  BP    Temp    Pulse 88 03/10/21 1026  Resp 19 03/10/21 1026  SpO2 100 % 03/10/21 1026  Vitals shown include unvalidated device data.  Last Pain:  Vitals:   03/10/21 0838  TempSrc:   PainSc: 0-No pain      Patients Stated Pain Goal: 5 (34/75/83 0746)  Complications: No notable events documented.

## 2021-03-10 NOTE — Anesthesia Postprocedure Evaluation (Signed)
Anesthesia Post Note  Patient: Carrie Singh  Procedure(s) Performed: LEFT MASTECTOMY WITH LEFT SENTINEL LYMPH NODE BIOPSY (Left: Breast)     Patient location during evaluation: PACU Anesthesia Type: General Level of consciousness: awake and alert Pain management: pain level controlled Vital Signs Assessment: post-procedure vital signs reviewed and stable Respiratory status: spontaneous breathing, nonlabored ventilation and respiratory function stable Cardiovascular status: blood pressure returned to baseline and stable Postop Assessment: no apparent nausea or vomiting Anesthetic complications: no   No notable events documented.  Last Vitals:  Vitals:   03/10/21 1045 03/10/21 1100  BP: (!) 191/99 (!) 197/109  Pulse: 76 76  Resp: 13 14  Temp:    SpO2: 98% 97%    Last Pain:  Vitals:   03/10/21 1045  TempSrc:   PainSc: 2                  Lynda Rainwater

## 2021-03-10 NOTE — Interval H&P Note (Signed)
History and Physical Interval Note:  03/10/2021 8:11 AM  Carrie Singh  has presented today for surgery, with the diagnosis of LEFT BREAST CANCER.  The various methods of treatment have been discussed with the patient and family. After consideration of risks, benefits and other options for treatment, the patient has consented to  Procedure(s): LEFT MASTECTOMY WITH LEFT SENTINEL LYMPH NODE BIOPSY (Left) as a surgical intervention.  The patient's history has been reviewed, patient examined, no change in status, stable for surgery.  I have reviewed the patient's chart and labs.  Questions were answered to the patient's satisfaction.     Turner Daniels MD

## 2021-03-11 ENCOUNTER — Encounter (HOSPITAL_BASED_OUTPATIENT_CLINIC_OR_DEPARTMENT_OTHER): Payer: Self-pay | Admitting: Surgery

## 2021-03-11 DIAGNOSIS — Z87891 Personal history of nicotine dependence: Secondary | ICD-10-CM | POA: Diagnosis not present

## 2021-03-11 DIAGNOSIS — F32A Depression, unspecified: Secondary | ICD-10-CM | POA: Diagnosis not present

## 2021-03-11 DIAGNOSIS — C50412 Malignant neoplasm of upper-outer quadrant of left female breast: Secondary | ICD-10-CM | POA: Diagnosis not present

## 2021-03-11 DIAGNOSIS — N189 Chronic kidney disease, unspecified: Secondary | ICD-10-CM | POA: Diagnosis not present

## 2021-03-11 DIAGNOSIS — R569 Unspecified convulsions: Secondary | ICD-10-CM | POA: Diagnosis not present

## 2021-03-11 DIAGNOSIS — Z170 Estrogen receptor positive status [ER+]: Secondary | ICD-10-CM | POA: Diagnosis not present

## 2021-03-11 DIAGNOSIS — E1122 Type 2 diabetes mellitus with diabetic chronic kidney disease: Secondary | ICD-10-CM | POA: Diagnosis not present

## 2021-03-11 DIAGNOSIS — K219 Gastro-esophageal reflux disease without esophagitis: Secondary | ICD-10-CM | POA: Diagnosis not present

## 2021-03-11 DIAGNOSIS — I129 Hypertensive chronic kidney disease with stage 1 through stage 4 chronic kidney disease, or unspecified chronic kidney disease: Secondary | ICD-10-CM | POA: Diagnosis not present

## 2021-03-11 LAB — GLUCOSE, CAPILLARY: Glucose-Capillary: 108 mg/dL — ABNORMAL HIGH (ref 70–99)

## 2021-03-11 NOTE — Discharge Summary (Signed)
Physician Discharge Summary  Patient ID: Carrie Singh MRN: [id number] DOB/AGE: July 19, 1955 66 y.o.  Admit date: 03/10/2021 Discharge date: 03/11/2021  Admission Diagnoses: left breast cancer  Discharge Diagnoses: same  Active Problems:   * No active hospital problems. *   Discharged Condition: good  Hospital Course: Pt did well after mastectomy. She tolerated her diet, had good pain control  and could ambulate. Instructions given to patient and husband.     Treatments: surgery: left simple mastectomy and SLN mapping   Discharge Exam: Blood pressure (!) 164/80, pulse 81, temperature 98.4 F (36.9 C), resp. rate 16, height 5\' 5"  (1.651 m), weight 95.4 kg, SpO2 100 %. General appearance: alert and cooperative Resp: clear to auscultation bilaterally Cardio: NSR Incision/Wound: flaps viable no hematoma left mastectomy site   Disposition: Discharge disposition: 01-Home or Self Care       Discharge Instructions     Diet - low sodium heart healthy   Complete by: As directed    Increase activity slowly   Complete by: As directed          Signed: Joyice Faster Merced Hanners 03/11/2021, 7:55 AM

## 2021-03-12 NOTE — Addendum Note (Signed)
Addendum  created 03/12/21 0747 by Verita Lamb, CRNA   Charge Capture section accepted

## 2021-03-13 IMAGING — MG DIGITAL SCREENING UNILAT LEFT W/ TOMO W/ CAD
4 series · 4 of 12 positions shown · non-contrast
Comparison: Previous exam(s).

CLINICAL DATA: Screening.

EXAM:
DIGITAL SCREENING UNILATERAL LEFT MAMMOGRAM WITH CAD AND TOMO

[L MLO synth-2D]
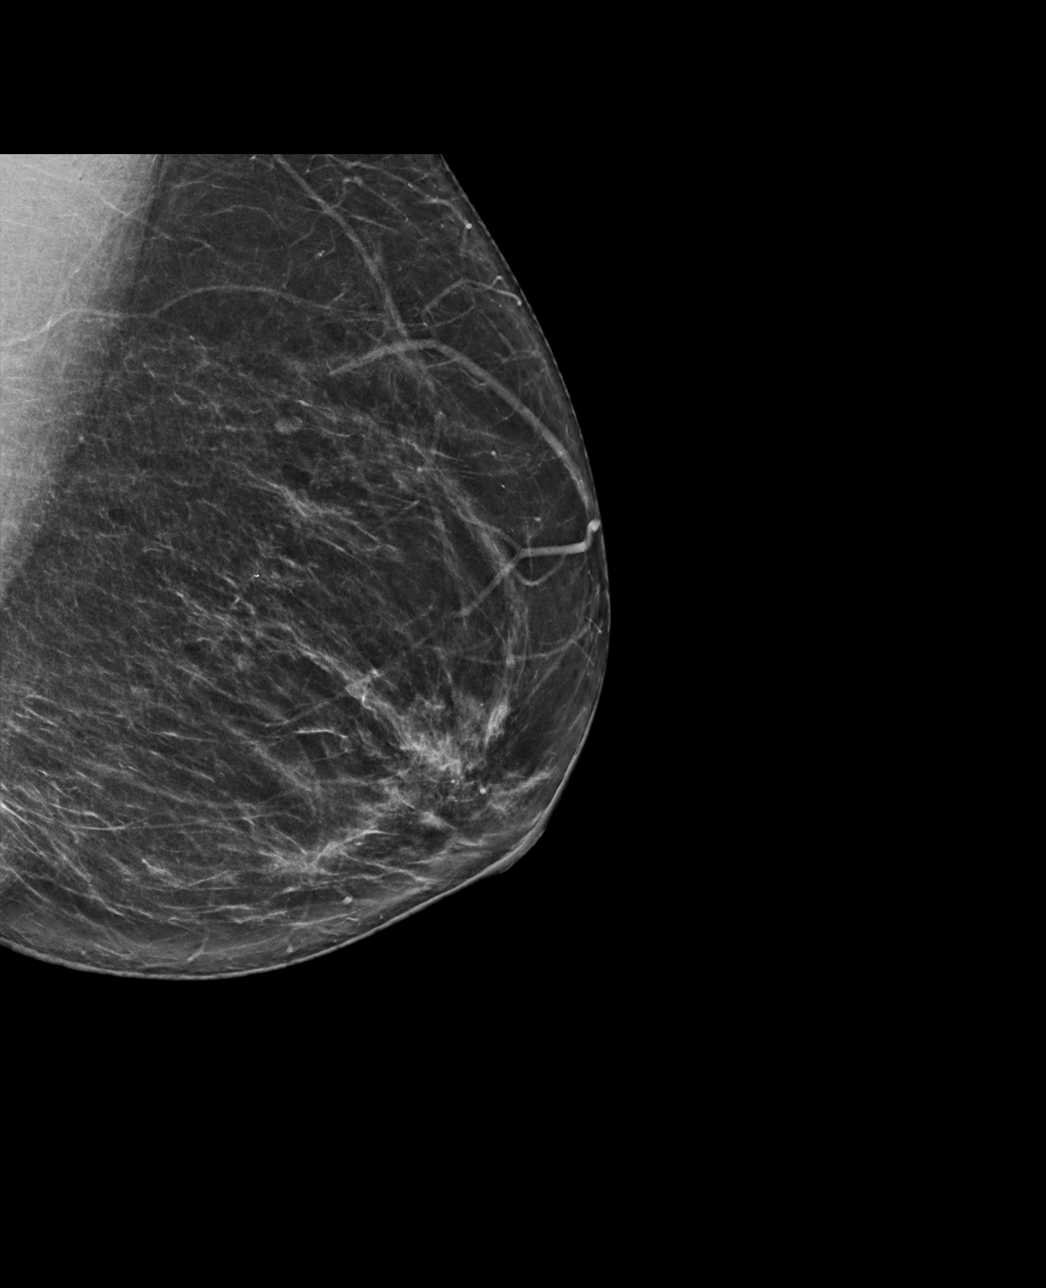

[L CC synth-2D]
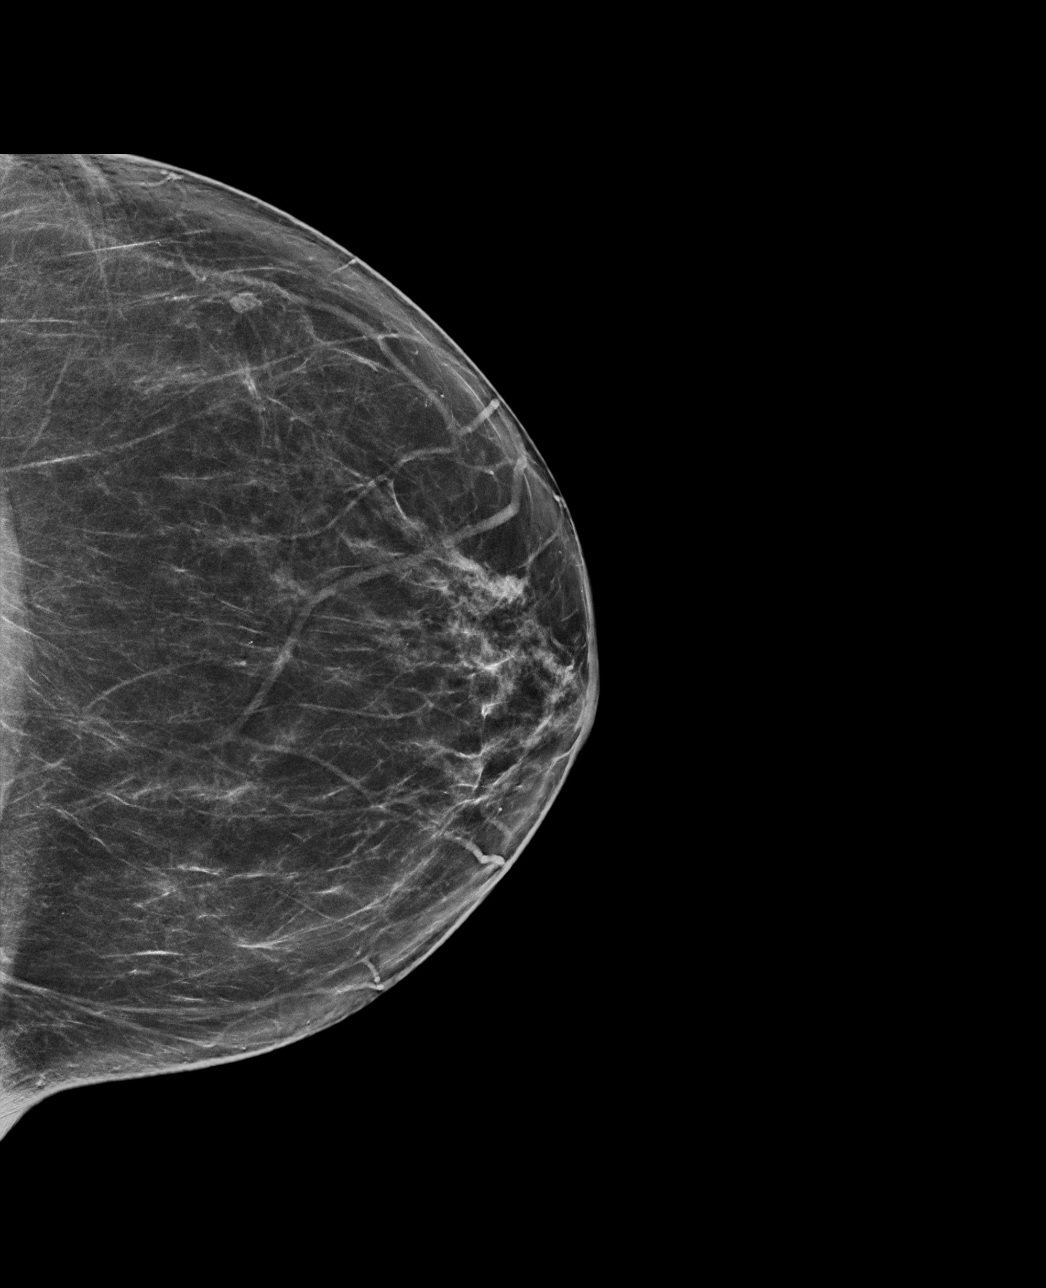

[L CC tomo · tomo slice 39/77.0]
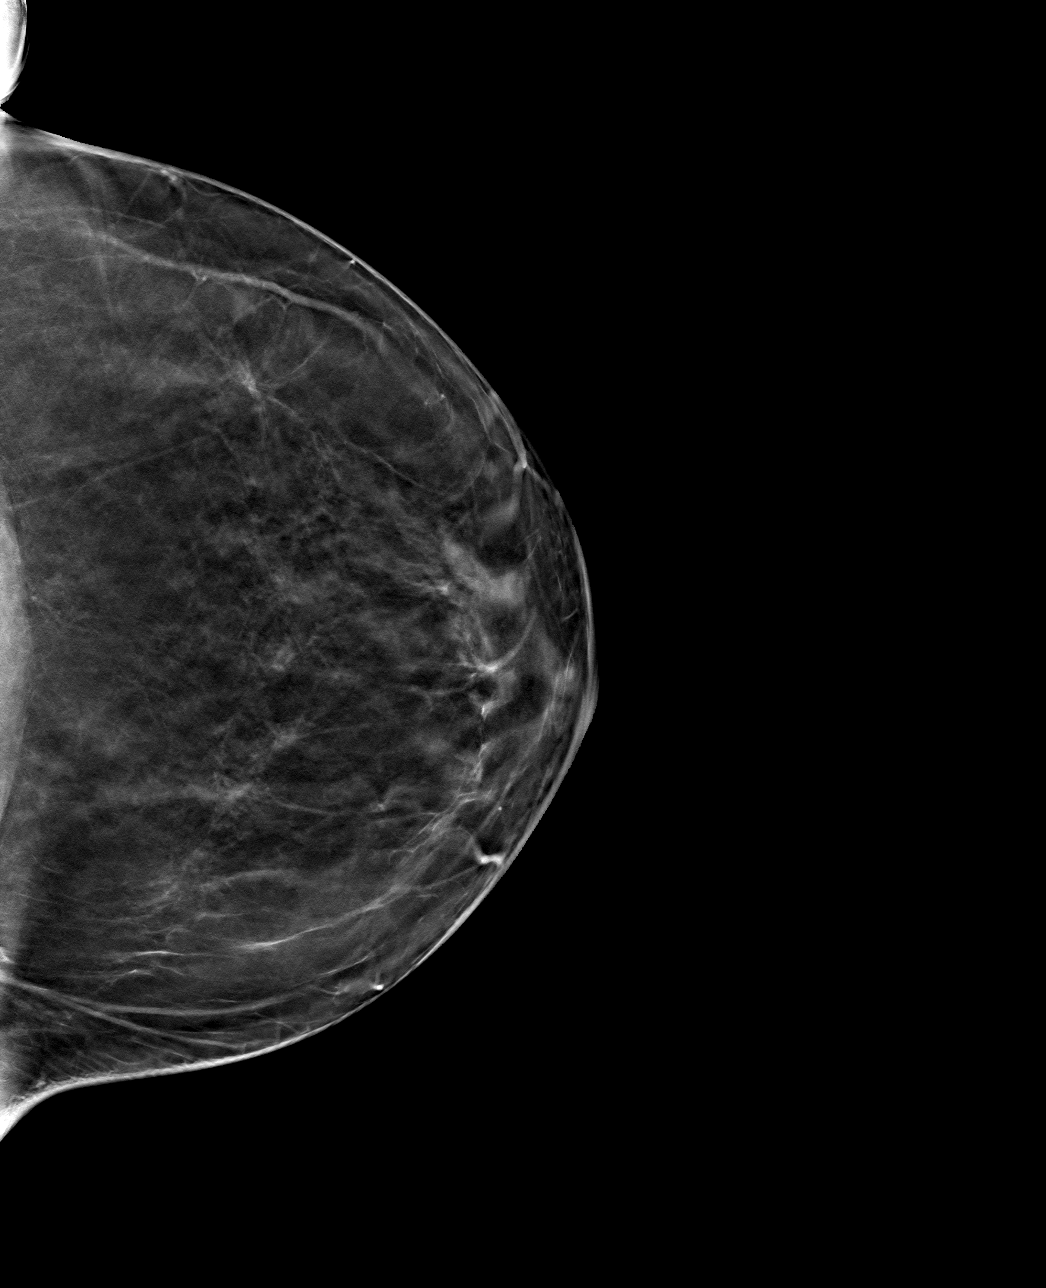

[L MLO tomo · tomo slice 38/75.0]
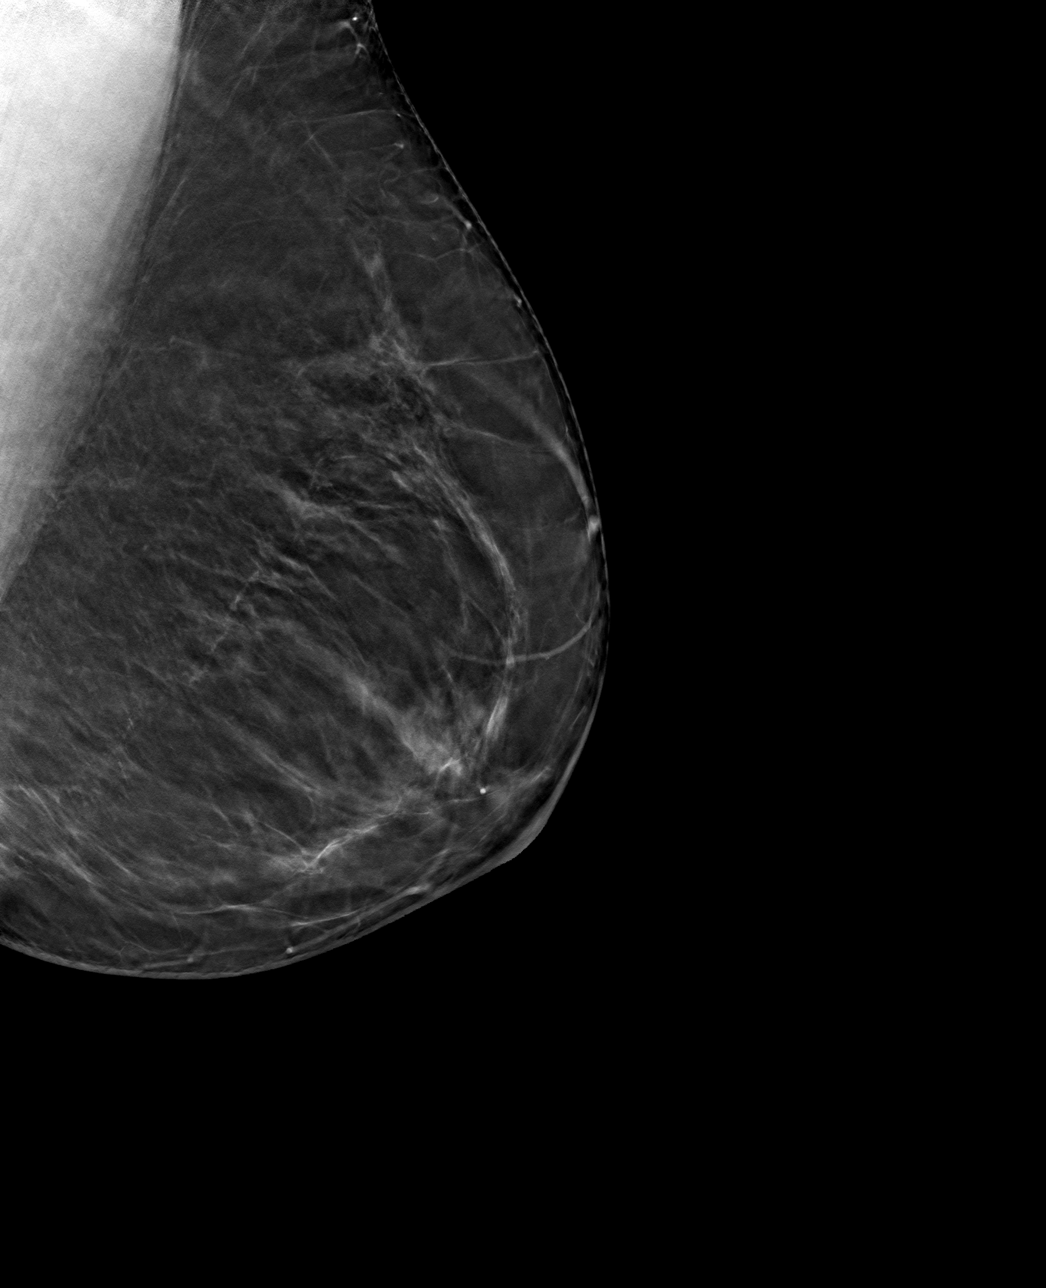

[4 of 12 positions shown; findings below may reference images not displayed]

ACR Breast Density Category b: There are scattered areas of
fibroglandular density.
FINDINGS: There are no findings suspicious for malignancy. Images were
processed with CAD.
IMPRESSION: No mammographic evidence of malignancy. A result letter of this
screening mammogram will be mailed directly to the patient.

RECOMMENDATION:
Screening mammogram in one year. (Code:51-[id number])

BI-RADS CATEGORY  1: Negative.

## 2021-03-15 ENCOUNTER — Encounter: Payer: Self-pay | Admitting: Surgery

## 2021-03-15 LAB — SURGICAL PATHOLOGY

## 2021-03-17 ENCOUNTER — Encounter: Payer: Self-pay | Admitting: *Deleted

## 2021-03-17 NOTE — Assessment & Plan Note (Signed)
Right simple mastectomy: Grade 2 IDC with DCIS 1.5 cm, margins -0/3 lymph nodes, T1b N0 stage IA, ER 100%, PR 90%, Ki-67 10%, HER-2 negative ratio 1.28 ?Oncotype DX score 11:7% risk of recurrence ?(2009 right breast cancer treated with lumpectomy radiation and 5 years of antiestrogen therapy) ?? ?Current treatment: Letrozole 2.5 mg daily started 04/2016 switched to exemestane 05/26/2016?switched to anastrozole ?? ?Breast cancer recurrence ?1. Mammogram?left breast: With ultrasound 02/02/2021: 0.6 cm irregular hypoechoic mass: Biopsy: Grade 2 invasive ductal carcinoma ER 100%, PR 0%, HER2 equivocal, FISH pending ?? ?Counseling: I discussed with the patient about the breast cancer recurrence and its characteristics.  Because her tumor is small at 0.6 cm and it is ER positive, there should not be any role of chemotherapy. ?? ?Recommendation: ?1. Left Mastectomy : Grade 2 IDC 1.1 cm, 0/8 LN neg, ER 100%, PR: 0%, ki 67: 15%, HER 2 neg ?2. Followed by adjuvant antiestrogen therapy.  We may have to choose a different antiestrogen therapy since anastrozole failed to prevent this recurrence. ?? ?Pathology counseling: I discussed the final pathology report of the patient provided  a copy of this report. I discussed the margins as well as lymph node surgeries. We also discussed the final staging along with previously performed ER/PR and HER-2/neu testing. ? ? ?

## 2021-03-17 NOTE — Progress Notes (Signed)
? ?Patient Care Team: ?Jearld Fenton, NP as PCP - General (Internal Medicine) ?Minna Merritts, MD as Consulting Physician (Cardiology) ?Nicholas Lose, MD as Consulting Physician (Hematology and Oncology) ?Alphonsa Overall, MD as Consulting Physician (General Surgery) ?Gardenia Phlegm, NP as Nurse Practitioner (Hematology and Oncology) ?Mauro Kaufmann, RN as Oncology Nurse Navigator ?Rockwell Germany, RN as Oncology Nurse Navigator ? ?DIAGNOSIS:  ?  ICD-10-CM   ?1. Malignant neoplasm of lower-outer quadrant of right breast of female, estrogen receptor positive ([place])  C50.511   ? Z17.0   ?  ? ? ?SUMMARY OF ONCOLOGIC HISTORY: ?Oncology History  ?Malignant neoplasm of lower-outer quadrant of right breast of female, estrogen receptor positive ([place])  ?12/18/2007 Initial Diagnosis  ? Right breast cancer treated with lumpectomy followed by radiation and 5 years of antiestrogen therapy with aromatase inhibitor ?  ?02/02/2016 Relapse/Recurrence  ? Right breast biopsy 6:00 position: IDC with DCIS grade 2, ER 100%, PR 90%, Ki-67 10%, HER-2 negative ratio 1.28, screening detected right breast lumps 0.5 cm and 0.3 cm, T1a N0 stage IA clinical stage ?  ?03/18/2016 Surgery  ? Right simple mastectomy: Grade 2 IDC with DCIS 1.5 cm, margins -0/3 lymph nodes, T1b N0 stage IA, ER 100%, PR 90%, Ki-67 10%, HER-2 negative ratio 1.28 ?  ?03/18/2016 Oncotype testing  ? 11/7% ?  ?04/2016 -  Anti-estrogen oral therapy  ? Letrozole 2.6m daily switch to exemestane 25 mg daily May 2018 switched to anastrozole 1 mg daily due to cost ?  ? ? ?CHIEF COMPLIANT: Follow-up of right breast cancer status postmastectomy ? ?INTERVAL HISTORY: Carrie HASTINGSis a 66y.o. with above-mentioned history of right breast cancer treated with mastectomy followed by antiestrogen therapy with anastrozole. She presents to the clinic today for follow-up.  She is healing and recovering very well from recent surgery.  She is not requiring any pain medications.  She  currently has drains. ? ?ALLERGIES:  has No Known Allergies. ? ?MEDICATIONS:  ?Current Outpatient Medications  ?Medication Sig Dispense Refill  ? amLODipine (NORVASC) 2.5 MG tablet Take by mouth.    ? anastrozole (ARIMIDEX) 1 MG tablet Take 1 tablet (1 mg total) by mouth daily. 90 tablet 3  ? Biotin w/ Vitamins C & E (HAIR SKIN & NAILS GUMMIES PO) Take by mouth.    ? calcium carbonate (OSCAL) 1500 (600 Ca) MG TABS tablet Take by mouth 2 (two) times daily with a meal.    ? carvedilol (COREG) 12.5 MG tablet Take 12.5 mg by mouth in the morning and at bedtime.    ? cholecalciferol (VITAMIN D3) 25 MCG (1000 UNIT) tablet Take 2,000 Units by mouth daily.    ? hydrALAZINE (APRESOLINE) 100 MG tablet TAKE 1 TABLET BY MOUTH 3  TIMES DAILY 270 tablet 1  ? ibuprofen (ADVIL) 800 MG tablet Take 1 tablet (800 mg total) by mouth every 8 (eight) hours as needed. 30 tablet 0  ? irbesartan (AVAPRO) 300 MG tablet TAKE 1 TABLET BY MOUTH  DAILY 90 tablet 1  ? Lancets (ONETOUCH DELICA PLUS [id number] MISC CHECK BLOOD SUGAR TWICE  DAILY 200 each 3  ? metFORMIN (GLUCOPHAGE) 1000 MG tablet Take 1,000 mg by mouth 2 (two) times daily with a meal.    ? Multiple Vitamins-Minerals (ONE-A-DAY WOMENS VITACRAVES) CHEW Chew 1 tablet by mouth daily.     ? ONETOUCH ULTRA test strip USE TWICE DAILY AS DIRECTED 200 strip 3  ? oxyCODONE (OXY IR/ROXICODONE) 5 MG immediate release tablet Take  1 tablet (5 mg total) by mouth every 6 (six) hours as needed for severe pain. 15 tablet 0  ? pravastatin (PRAVACHOL) 40 MG tablet TAKE 1 TABLET BY MOUTH DAILY 90 tablet 1  ? repaglinide (PRANDIN) 0.5 MG tablet Take 0.5 mg by mouth 3 (three) times daily before meals.    ? SHINGRIX injection Inject 0.5 mL into muscle for shingles vaccine. Repeat dose in 2-6 months. 0.5 mL 1  ? ?No current facility-administered medications for this visit.  ? ? ?PHYSICAL EXAMINATION: ?ECOG PERFORMANCE STATUS: 1 - Symptomatic but completely ambulatory ? ?Vitals:  ? 03/18/21 1107  ?BP: (!)  155/84  ?Pulse: 81  ?Resp: 18  ?Temp: 98.1 ?F (36.7 ?C)  ?SpO2: 100%  ? ?Filed Weights  ? 03/18/21 1107  ?Weight: 208 lb 1.6 oz (94.4 kg)  ? ?  ? ?LABORATORY DATA:  ?I have reviewed the data as listed ?CMP Latest Ref Rng & Units 03/05/2021 01/12/2021 01/09/2020  ?Glucose 70 - 99 mg/dL 119(H) 178(H) 131(H)  ?BUN 8 - 23 mg/dL 14 20 17   ?Creatinine 0.44 - 1.00 mg/dL 1.07(H) 1.31(H) 1.30(H)  ?Sodium 135 - 145 mmol/L 138 143 140  ?Potassium 3.5 - 5.1 mmol/L 3.6 4.2 4.4  ?Chloride 98 - 111 mmol/L 104 104 104  ?CO2 22 - 32 mmol/L 26 33(H) 31  ?Calcium 8.9 - 10.3 mg/dL 9.0 9.9 10.1  ?Total Protein 6.1 - 8.1 g/dL - 6.6 7.2  ?Total Bilirubin 0.2 - 1.2 mg/dL - 0.9 1.2  ?Alkaline Phos 39 - 117 U/L - - 68  ?AST 10 - 35 U/L - 12 14  ?ALT 6 - 29 U/L - 11 14  ? ? ?Lab Results  ?Component Value Date  ? WBC 3.9 01/12/2021  ? HGB 12.5 01/12/2021  ? HCT 38.4 01/12/2021  ? MCV 91.9 01/12/2021  ? PLT 250 01/12/2021  ? NEUTROABS 2.7 01/24/2018  ? ? ?ASSESSMENT & PLAN:  ?Malignant neoplasm of lower-outer quadrant of right breast of female, estrogen receptor positive ([place]) ?Right simple mastectomy: Grade 2 IDC with DCIS 1.5 cm, margins -0/3 lymph nodes, T1b N0 stage IA, ER 100%, PR 90%, Ki-67 10%, HER-2 negative ratio 1.28 ?Oncotype DX score 11:7% risk of recurrence ?(2009 right breast cancer treated with lumpectomy radiation and 5 years of antiestrogen therapy) ?  ?Current treatment: Letrozole 2.5 mg daily started 04/2016 switched to exemestane 05/26/2016 switched to anastrozole ?  ?Breast cancer recurrence ?1. Mammogram left breast: With ultrasound 02/02/2021: 0.6 cm irregular hypoechoic mass: Biopsy: Grade 2 invasive ductal carcinoma ER 100%, PR 0%, HER2 equivocal, FISH pending ?  ?Counseling: I discussed with the patient about the breast cancer recurrence and its characteristics.  Because her tumor is small at 0.6 cm and it is ER positive, there should not be any role of chemotherapy. ?  ?Recommendation: ?Left Mastectomy : Grade 2 IDC 1.1  cm, 0/8 LN neg, ER 100%, PR: 0%, ki 67: 15%, HER 2 neg ?Followed by adjuvant antiestrogen therapy.  We are switching her from anastrozole to letrozole. ? ?Pathology counseling: I discussed the final pathology report of the patient provided  a copy of this report. I discussed the margins as well as lymph node surgeries. We also discussed the final staging along with previously performed ER/PR and HER-2/neu testing. ? ?She wants to go to the live strong program once she heals from surgery. ?Return to clinic in 3 months for survivorship care plan visit ? ? ?No orders of the defined types were placed in this encounter. ? ?  The patient has a good understanding of the overall plan. she agrees with it. she will call with any problems that may develop before the next visit here. ? ?Total time spent: 30 mins including face to face time and time spent for planning, charting and coordination of care ? ?Rulon Eisenmenger, MD, MPH ?03/18/2021 ? ?I, Thana Ates, am acting as scribe for Dr. Nicholas Lose. ? ?I have reviewed the above documentation for accuracy and completeness, and I agree with the above. ? ? ? ? ? ? ?

## 2021-03-18 ENCOUNTER — Encounter: Payer: Self-pay | Admitting: *Deleted

## 2021-03-18 ENCOUNTER — Other Ambulatory Visit: Payer: Self-pay

## 2021-03-18 ENCOUNTER — Inpatient Hospital Stay: Payer: Medicare Other | Attending: Hematology and Oncology | Admitting: Hematology and Oncology

## 2021-03-18 DIAGNOSIS — Z170 Estrogen receptor positive status [ER+]: Secondary | ICD-10-CM | POA: Diagnosis not present

## 2021-03-18 DIAGNOSIS — Z9011 Acquired absence of right breast and nipple: Secondary | ICD-10-CM | POA: Insufficient documentation

## 2021-03-18 DIAGNOSIS — C50511 Malignant neoplasm of lower-outer quadrant of right female breast: Secondary | ICD-10-CM

## 2021-03-18 DIAGNOSIS — Z79811 Long term (current) use of aromatase inhibitors: Secondary | ICD-10-CM | POA: Insufficient documentation

## 2021-03-18 DIAGNOSIS — Z79899 Other long term (current) drug therapy: Secondary | ICD-10-CM | POA: Insufficient documentation

## 2021-03-18 MED ORDER — LETROZOLE 2.5 MG PO TABS: 2.5000 mg | ORAL_TABLET | Freq: Every day | ORAL | 3 refills | Status: DC

## 2021-03-19 ENCOUNTER — Ambulatory Visit: Payer: Medicare Other | Admitting: Hematology and Oncology

## 2021-03-25 ENCOUNTER — Other Ambulatory Visit: Payer: Self-pay | Admitting: Internal Medicine

## 2021-03-26 NOTE — Telephone Encounter (Signed)
Requested Prescriptions  ?Pending Prescriptions Disp Refills  ?? irbesartan (AVAPRO) 300 MG tablet [Pharmacy Med Name: Irbesartan 300 MG Oral Tablet] 90 tablet 1  ?  Sig: TAKE 1 TABLET BY MOUTH  DAILY  ?  ? Cardiovascular:  Angiotensin Receptor Blockers Failed - 03/25/2021 10:02 PM  ?  ?  Failed - Cr in normal range and within 180 days  ?  Creat  ?Date Value Ref Range Status  ?01/12/2021 1.31 (H) 0.50 - 1.05 mg/dL Final  ? ?Creatinine, Ser  ?Date Value Ref Range Status  ?03/05/2021 1.07 (H) 0.44 - 1.00 mg/dL Final  ? ?Creatinine,U  ?Date Value Ref Range Status  ?04/03/2014 447.0 mg/dL Final  ? ?Creatinine, Urine  ?Date Value Ref Range Status  ?01/12/2021 423 (H) 20 - 275 mg/dL Final  ?   ?  ?  Failed - Last BP in normal range  ?  BP Readings from Last 1 Encounters:  ?03/18/21 (!) 155/84  ?   ?  ?  Passed - K in normal range and within 180 days  ?  Potassium  ?Date Value Ref Range Status  ?03/05/2021 3.6 3.5 - 5.1 mmol/L Final  ?10/12/2013 3.9 3.5 - 5.1 mmol/L Final  ?   ?  ?  Passed - Patient is not pregnant  ?  ?  Passed - Valid encounter within last 6 months  ?  Recent Outpatient Visits   ?      ? 2 months ago Encounter for general adult medical examination with abnormal findings  ? [facility] [place], [place] W, NP  ? 7 months ago Herpes zoster with other complication  ? [facility] [place], Coralie Keens, NP  ?  ?  ? ?  ?  ?  ? ?

## 2021-03-30 DIAGNOSIS — E1122 Type 2 diabetes mellitus with diabetic chronic kidney disease: Secondary | ICD-10-CM | POA: Diagnosis not present

## 2021-03-30 DIAGNOSIS — N1831 Chronic kidney disease, stage 3a: Secondary | ICD-10-CM | POA: Diagnosis not present

## 2021-03-30 DIAGNOSIS — I10 Essential (primary) hypertension: Secondary | ICD-10-CM | POA: Diagnosis not present

## 2021-04-01 ENCOUNTER — Encounter: Payer: Self-pay | Admitting: Nurse Practitioner

## 2021-04-02 ENCOUNTER — Other Ambulatory Visit: Payer: Self-pay | Admitting: *Deleted

## 2021-04-02 MED ORDER — LETROZOLE 2.5 MG PO TABS: 2.5000 mg | ORAL_TABLET | Freq: Every day | ORAL | 3 refills | Status: DC

## 2021-04-06 DIAGNOSIS — I10 Essential (primary) hypertension: Secondary | ICD-10-CM | POA: Diagnosis not present

## 2021-04-06 DIAGNOSIS — N1831 Chronic kidney disease, stage 3a: Secondary | ICD-10-CM | POA: Diagnosis not present

## 2021-04-06 DIAGNOSIS — E1122 Type 2 diabetes mellitus with diabetic chronic kidney disease: Secondary | ICD-10-CM | POA: Diagnosis not present

## 2021-04-07 ENCOUNTER — Other Ambulatory Visit: Payer: Self-pay

## 2021-04-07 ENCOUNTER — Ambulatory Visit: Payer: Medicare Other | Attending: Adult Health

## 2021-04-07 DIAGNOSIS — M25612 Stiffness of left shoulder, not elsewhere classified: Secondary | ICD-10-CM | POA: Diagnosis not present

## 2021-04-07 DIAGNOSIS — C50511 Malignant neoplasm of lower-outer quadrant of right female breast: Secondary | ICD-10-CM | POA: Diagnosis not present

## 2021-04-07 DIAGNOSIS — R293 Abnormal posture: Secondary | ICD-10-CM | POA: Diagnosis not present

## 2021-04-07 DIAGNOSIS — M79602 Pain in left arm: Secondary | ICD-10-CM | POA: Diagnosis not present

## 2021-04-07 NOTE — Therapy (Signed)
[facility] ?[place], [place], [address] ?Phone: [phone]   Fax:  [phone] ? ?Physical Therapy Treatment ? ?Patient Details  ?Name: Carrie Singh ?MRN: [id number] ?Date of Birth: May 15, 1955 ?Referring Provider (PT): Carrie Singh ? ? ?Encounter Date: 04/07/2021 ? ? PT End of Session - 04/07/21 2033   ? ? Visit Number 2   ? Number of Visits 14   ? Date for PT Re-Evaluation 05/19/21   ? PT Start Time 2297   ? PT Stop Time 1600   ? PT Time Calculation (min) 56 min   ? Activity Tolerance Patient tolerated treatment well   ? Behavior During Therapy [facility] for tasks assessed/performed   ? ?  ?  ? ?  ? ? ?Past Medical History:  ?Diagnosis Date  ? Breast cancer ([place]) 01/2021  ? left breast IDC  ? Breast cancer, right breast ([place]) 12/2007  ? a. s/p chemo, radiation, and lumpectomy with negative sentinel lymph node biopsy   ? Complication of anesthesia   ? woke up during colonoscopy  ? Depression   ? Diabetes mellitus without complication ([place])   ? GERD (gastroesophageal reflux disease)   ? Hyperlipidemia   ? Hypertension   ? resistant, negative renal Dopplers and negative CT angiogram  ? Migraines   ? Obesity   ? ? ?Past Surgical History:  ?Procedure Laterality Date  ? ABDOMINAL HYSTERECTOMY    ? BREAST LUMPECTOMY    ? CYST EXCISION    ? FOOT SURGERY    ? MASTECTOMY Right   ? MASTECTOMY W/ SENTINEL NODE BIOPSY Right 03/18/2016  ? Procedure: RIGHT MASTECTOMY WITH RIGHT AXILLARY SENTINEL LYMPH NODE BIOPSY;  Surgeon: Carrie Overall, MD;  Location: [facility];  Service: General;  Laterality: Right;  ? MASTECTOMY W/ SENTINEL NODE BIOPSY Left 03/10/2021  ? Procedure: LEFT MASTECTOMY WITH LEFT SENTINEL LYMPH NODE BIOPSY;  Surgeon: Carrie Luna, MD;  Location: [facility];  Service: General;  Laterality: Left;  ? ovary tumor    ? vocal cord nodules    ? ? ?There were no vitals filed for this visit. ? ? Subjective Assessment - 04/07/21 1505   ? ?  Subjective Pt had a left mastectomy on 03/10/2021. Drains were removed on 03/26/2021. Having intermittent sharp pains in the left chest. ROM is doing better since I have been doing the exercises.   ? Pertinent History She has a history of right breast cancer treated initially back in 2009 with breast conserving surgery. She had additional cancer in 2018 and right breast underwent simple mastectomy. She had symptom mapping both times and radiation with her lumpectomy. 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 She had a left mastectomy on 03/10/2021. It was ER =, PR0, HER 2 - with a Ki 47 of 15% Pt has a hx of renal disease, DM, shingles. She is on disability. Right shoulder ROM has been limited since prior surgeries.  she never had rehab.   ? Patient Stated Goals post surgical reassessment   ? Currently in Pain? Yes   ? Pain Score 6    ? Pain Location Axilla   ? Pain Orientation Left   ? Pain Descriptors / Indicators Sore;Tightness;Tender;Aching   ? Pain Type Surgical pain   ? Pain Onset 1 to 4 weeks  ago   ? Pain Frequency Intermittent   ? Aggravating Factors  soreness is there all the time since surgery.   ? ?  ?  ? ?  ? ? ? ? ? [facility] PT Assessment - 04/07/21 0001   ? ?  ? Assessment  ? Medical Diagnosis Left breast Cancer   ? Referring Provider (PT) Carrie Singh   ? Onset Date/Surgical Date 03/10/21   ? Hand Dominance Right   ?  ? Precautions  ? Precaution Comments lymphedema risk bilaterally  ?  ? Prior Function  ? Level of Independence Independent   ? Vocation On disability   ?  ? Cognition  ? Singh Cognitive Status Within Functional Limits for tasks assessed   ?  ? Observation/Other Assessments  ? Observations mastectomy incision healing well with scabs and glue still present. not ready for scar massage yet. Swelling noted around incision but improving per pt. Cording  noted at left axillary region and although not palpable in forearm appears to run into forearm and is tender.   ?  ? AROM  ? Left Shoulder Extension 56 Degrees   ? Left Shoulder Flexion 93 Degrees   ? Left Shoulder ABduction 85 Degrees   ? ?  ?  ? ?  ? ? ? ? LYMPHEDEMA/ONCOLOGY QUESTIONNAIRE - 04/07/21 0001   ? ?  ? Type  ? Cancer Type Left Breast Cancer   ?  ? Surgeries  ? Mastectomy Date 03/10/21   ? Lumpectomy Date --   2009; Right Lumpectomy  ? Sentinel Lymph Node Biopsy Date 03/10/21   ? Number Lymph Nodes Removed 8   ?  ? Treatment  ? Active Chemotherapy Treatment No   ? Past Chemotherapy Treatment No   ? Active Radiation Treatment No   ? Past Radiation Treatment Yes   ? Current Hormone Treatment Yes   ? Drug Name Letrozole   ? Past Hormone Therapy Yes   ?  ? What other symptoms do you have  ? Are you Having Heaviness or Tightness Yes   ? Are you having Pain Yes   ? Are you having pitting edema No   ? Is it Hard or Difficult finding clothes that fit No   ? Do you have infections --   not this episode  ? Is there Decreased scar mobility Yes   ?  ? Right Upper Extremity Lymphedema  ? Olecranon Process 29.6 cm   ? 10 cm Proximal to Ulnar Styloid Process 26.4 cm   ? Just Proximal to Ulnar Styloid Process 18.7 cm   ? At [facility] of 2nd Digit 6.4 cm   ?  ? Left Upper Extremity Lymphedema  ? 10 cm Proximal to Olecranon Process 36.9 cm   ? Olecranon Process 28.2 cm   ? 10 cm Proximal to Ulnar Styloid Process 26.7 cm   ? Just Proximal to Ulnar Styloid Process 18.2 cm   ? At [facility] of 2nd Digit 6 cm   ? ?  ?  ? ?  ? ? ? ? ? Carrie Singh - 04/07/21 0001   ? ? Open a tight or new jar Mild difficulty   ? Do heavy household chores (wash walls, wash floors) Severe difficulty   ? Carry a shopping bag or briefcase Mild difficulty   ? Wash your back Severe difficulty   ? Use a knife to cut food Moderate difficulty   ? Recreational activities in which you take some force or impact through  your arm, shoulder, or hand (golf, hammering,  tennis) Severe difficulty   ? During the past week, to what extent has your arm, shoulder or hand problem interfered with your normal social activities with family, friends, neighbors, or groups? Quite a bit   ? During the past week, to what extent has your arm, shoulder or hand problem limited your work or other regular daily activities Quite a bit   ? Arm, shoulder, or hand pain. Severe   ? Tingling (pins and needles) in your arm, shoulder, or hand Severe   ? Difficulty Sleeping Severe difficulty   ? DASH Score 63.64 %   ? ?  ?  ? ?  ? ? ? ? ? ? ? ? ? [facility] Adult PT Treatment/Exercise - 04/07/21 0001   ? ?  ? Shoulder Exercises: Supine  ? Other Supine Exercises REviewed 4 post op exercises; clasped hand flexion and star gazer in supine x 5  sitting scapular retraction, and standing wall slides for abd x 5   ?  ? Manual Therapy  ? Edema Management half inch gray foam pad covered with TG soft made to go over incision area of swelling to wear in compression bra.   ? ?  ?  ? ?  ? ? ? ? ? ? ? ? ? ? ? ? ? ? ? PT Long Term Goals - 04/07/21 2050   ? ?  ? PT LONG TERM GOAL #1  ? Title Pt will restore left shoulder ROM to within 5-10 degrees of pre-op levels for functional use of left UE   ? Time 6   ? Period Weeks   ? Status On-going   ? Target Date 05/19/21   ?  ? PT LONG TERM GOAL #2  ? Title Pt will have decreased axillary and arm pain from cording by 50% or more   ? Time 6   ? Period Weeks   ? Status New   ? Target Date 05/19/21   ?  ? PT LONG TERM GOAL #3  ? Title Pt will attend ABC classs   ? Time 4   ? Period Weeks   ? Status New   ? Target Date 05/19/21   ?  ? PT LONG TERM GOAL #4  ? Title Pt will be fit for prophylactic compression sleeve/gauntlet   ? Time 6   ? Period Weeks   ? Status New   ? Target Date 05/19/21   ?  ? PT LONG TERM GOAL #5  ? Title Pt willhave atleast 130 degrees of left shoulder flexion and abduction for improved ability to perform home activities   ? Time 6   ? Period Weeks   ? Status New   ?  Target Date 05/19/21   ? ?  ?  ? ?  ? ? ? ? ? ? ? ? Plan - 04/07/21 2034   ? ? Clinical Impression Statement Pt is s/p left mastectomy with SLNB with 0/8 LN's for Invasive ductal carcinoma. She presents with p

## 2021-04-07 NOTE — Patient Instructions (Signed)
? ? ?   [facility] ? [address], [address] ? [place] [address] ? [phone] ? ?After Breast Cancer Class (April 3rd 11:00-12:00) ?It is recommended you attend the ABC class to be educated on lymphedema risk reduction. This class is free of charge and lasts for 1 hour. It is a 1-time class. You will need to download the Webex app either on your phone or computer. We will send you a link the night before or the morning of the class. You should be able to click on that link to join the class. This is not a confidential class. You don't have to turn your camera on, but other participants may be able to see your email address. ? ?Scar massage ?You can begin gentle scar massage to you incision sites when scabs are off and as instructed in therapy. Gently place one hand on the incision and move the skin (without sliding on the skin) in various directions. Do this for a few minutes and then you can gently massage either coconut oil or vitamin E cream into the scars. ? ?Compression garment ?You should continue wearing your compression bra until you feel like you no longer have swelling. ? ?Home exercise Program ?Continue doing the exercises you were given until you feel like you can do them without feeling any tightness at the end.  ? ?Walking Program ?Studies show that 30 minutes of walking per day (fast enough to elevate your heart rate) can significantly reduce the risk of a cancer recurrence. If you can't walk due to other medical reasons, we encourage you to find another activity you could do (like a stationary bike or water exercise). ? ?Posture ?After breast cancer surgery, people frequently sit with rounded shoulders posture because it puts their incisions on slack and feels better. If you sit like this and scar tissue forms in that position, you can become very tight and have pain sitting or standing with good posture. Try to be aware of your posture and sit and stand up tall to heal  properly. ? ?Follow up PT: Next is May 22 ?It is recommended you return every 3 months for the first 3 years following surgery to be assessed on the SOZO machine for an L-Dex score. This helps prevent clinically significant lymphedema in 95% of patients. These follow up screens are 10 minute appointments that you are not billed for. ? ?

## 2021-04-12 ENCOUNTER — Encounter: Payer: Self-pay | Admitting: Nurse Practitioner

## 2021-04-12 ENCOUNTER — Ambulatory Visit: Payer: Medicare Other | Admitting: Nurse Practitioner

## 2021-04-12 ENCOUNTER — Encounter: Payer: Self-pay | Admitting: Gastroenterology

## 2021-04-12 VITALS — BP 130/82 | HR 77 | Ht 65.0 in | Wt 211.0 lb

## 2021-04-12 DIAGNOSIS — Z800 Family history of malignant neoplasm of digestive organs: Secondary | ICD-10-CM | POA: Diagnosis not present

## 2021-04-12 DIAGNOSIS — Z01818 Encounter for other preprocedural examination: Secondary | ICD-10-CM | POA: Diagnosis not present

## 2021-04-12 DIAGNOSIS — Z1211 Encounter for screening for malignant neoplasm of colon: Secondary | ICD-10-CM

## 2021-04-12 MED ORDER — NA SULFATE-K SULFATE-MG SULF 17.5-3.13-1.6 GM/177ML PO SOLN: 1.0000 | ORAL | 0 refills | Status: DC

## 2021-04-12 NOTE — Progress Notes (Signed)
? ? ?Assessment :   ? ?Patient Profile:  ?66 yo female female, new to practice with a past medical history significant for HTN, DM, HLD, sleep apnea, CKD, hysterectomy, breast cancer s/p right lumpectomy and radiation in 2010 , recurrent disease leading to right mastectomy in 2018, left mastectomy  with left axilary sentinel lymph node mapping in February 2023. See PMH below for any additional history ? ?# Colon cancer screening. Last colonoscopy reportedly in 2013 in [place]. No alarm symptoms. Does have [facility] of colon cancer in father and brother but both > 53 years of age.  ? ?# History of breast cancer. Remote mastectomy in 2018, Left mastectomy with axillary sentinel lymph node mapping  Feb 2023. She gets labs draws in right arm / hand ? ? ?Plan:    ? ?Schedule for a colonoscopy. The risks and benefits of colonoscopy with possible polypectomy / biopsies were discussed and the patient agrees to proceed.  ? ? ?History of Present Illness:  ? ?Referred by PCP for colon cancer screening. Last colonoscopy in [facility] in 2013. She thinks two benign polyps removed and told to have a repeat in 10 years. No bowel changes, blood in stool or other alarm features. Her father had colon cancer  in his 28's and her brother in his 44's.  ? ?Chief complaint:  colon cancer screening ? ? ?Previous Labs / Imaging:: ? ?  Latest Ref Rng & Units 01/12/2021  ?  1:46 PM 01/09/2020  ?  9:57 AM 05/30/2019  ?  8:47 AM  ?CBC  ?WBC 3.8 - 10.8 Thousand/uL 3.9   3.8   4.1    ?Hemoglobin 11.7 - 15.5 g/dL 12.5   13.1   12.5    ?Hematocrit 35.0 - 45.0 % 38.4   39.5   37.2    ?Platelets 140 - 400 Thousand/uL 250   243.0   231.0    ? ? ?No results found for: LIPASE ? ?  Latest Ref Rng & Units 03/05/2021  ?  9:27 AM 01/12/2021  ?  1:46 PM 01/09/2020  ?  9:57 AM  ?CMP  ?Glucose 70 - 99 mg/dL 119   178   131    ?BUN 8 - 23 mg/dL '14   20   17    '$ ?Creatinine 0.44 - 1.00 mg/dL 1.07   1.31   1.30    ?Sodium 135 - 145 mmol/L 138   143   140    ?Potassium  3.5 - 5.1 mmol/L 3.6   4.2   4.4    ?Chloride 98 - 111 mmol/L 104   104   104    ?CO2 22 - 32 mmol/L 26   33   31    ?Calcium 8.9 - 10.3 mg/dL 9.0   9.9   10.1    ?Total Protein 6.1 - 8.1 g/dL  6.6   7.2    ?Total Bilirubin 0.2 - 1.2 mg/dL  0.9   1.2    ?Alkaline Phos 39 - 117 U/L   68    ?AST 10 - 35 U/L  12   14    ?ALT 6 - 29 U/L  11   14    ? ? ? ?Past Medical History:  ?Diagnosis Date  ? Breast cancer ([place]) 01/2021  ? left breast IDC  ? Breast cancer, right breast ([place]) 12/2007  ? a. s/p chemo, radiation, and lumpectomy with negative sentinel lymph node biopsy   ? Complication of anesthesia   ?  woke up during colonoscopy  ? Depression   ? Diabetes mellitus without complication ([place])   ? GERD (gastroesophageal reflux disease)   ? Hyperlipidemia   ? Hypertension   ? resistant, negative renal Dopplers and negative CT angiogram  ? Migraines   ? Obesity   ? ?Past Surgical History:  ?Procedure Laterality Date  ? ABDOMINAL HYSTERECTOMY    ? BREAST LUMPECTOMY    ? CYST EXCISION    ? FOOT SURGERY    ? MASTECTOMY Right   ? MASTECTOMY W/ SENTINEL NODE BIOPSY Right 03/18/2016  ? Procedure: RIGHT MASTECTOMY WITH RIGHT AXILLARY SENTINEL LYMPH NODE BIOPSY;  Surgeon: Alphonsa Overall, MD;  Location: [facility];  Service: General;  Laterality: Right;  ? MASTECTOMY W/ SENTINEL NODE BIOPSY Left 03/10/2021  ? Procedure: LEFT MASTECTOMY WITH LEFT SENTINEL LYMPH NODE BIOPSY;  Surgeon: Erroll Luna, MD;  Location: [facility];  Service: General;  Laterality: Left;  ? ovary tumor    ? vocal cord nodules    ? ?Family History  ?Problem Relation Age of Onset  ? Stroke Mother   ? Colon cancer Father   ? Heart disease Father   ? Asthma Father   ? Cancer Father   ?     colon  ? Cancer Sister   ?     lung  ? Colon cancer Brother   ? Cancer Brother   ?     colon  ? Diabetes Brother   ? Breast cancer Other 48  ? ?Social History  ? ?Tobacco Use  ? Smoking status: Never  ? Smokeless tobacco: Never  ?Vaping Use  ? Vaping Use: Never used   ?Substance Use Topics  ? Alcohol use: No  ?  Alcohol/week: 0.0 standard drinks  ? Drug use: No  ? ?Current Outpatient Medications  ?Medication Sig Dispense Refill  ? amLODipine (NORVASC) 2.5 MG tablet Take by mouth.    ? Biotin w/ Vitamins C & E (HAIR SKIN & NAILS GUMMIES PO) Take by mouth.    ? calcium carbonate (OSCAL) 1500 (600 Ca) MG TABS tablet Take by mouth 2 (two) times daily with a meal.    ? carvedilol (COREG) 12.5 MG tablet Take 12.5 mg by mouth in the morning and at bedtime.    ? cholecalciferol (VITAMIN D3) 25 MCG (1000 UNIT) tablet Take 2,000 Units by mouth daily.    ? hydrALAZINE (APRESOLINE) 100 MG tablet TAKE 1 TABLET BY MOUTH 3  TIMES DAILY 270 tablet 1  ? irbesartan (AVAPRO) 300 MG tablet TAKE 1 TABLET BY MOUTH  DAILY 90 tablet 1  ? letrozole (FEMARA) 2.5 MG tablet Take 1 tablet (2.5 mg total) by mouth daily. 90 tablet 3  ? metFORMIN (GLUCOPHAGE) 1000 MG tablet Take 1,000 mg by mouth 2 (two) times daily with a meal.    ? Multiple Vitamins-Minerals (ONE-A-DAY WOMENS VITACRAVES) CHEW Chew 1 tablet by mouth daily.     ? ONETOUCH ULTRA test strip USE TWICE DAILY AS DIRECTED 200 strip 3  ? pravastatin (PRAVACHOL) 40 MG tablet TAKE 1 TABLET BY MOUTH DAILY 90 tablet 1  ? repaglinide (PRANDIN) 0.5 MG tablet Take 0.5 mg by mouth 3 (three) times daily before meals.    ? SHINGRIX injection Inject 0.5 mL into muscle for shingles vaccine. Repeat dose in 2-6 months. 0.5 mL 1  ? Lancets (ONETOUCH DELICA PLUS [id number]) MISC CHECK BLOOD SUGAR TWICE  DAILY 200 each 3  ? ?No current facility-administered medications for this visit.  ? ?  No Known Allergies ? ? ?Review of Systems: ?Positive for headaches.  All other systems reviewed and negative except where noted in HPI.  ? ?Physical Exam:   ? ?Wt Readings from Last 3 Encounters:  ?04/12/21 211 lb (95.7 kg)  ?03/18/21 208 lb 1.6 oz (94.4 kg)  ?03/10/21 210 lb 5.1 oz (95.4 kg)  ? ? ?BP 130/82   Pulse 77   Ht '5\' 5"'$  (1.651 m)   Wt 211 lb (95.7 kg)   BMI 35.11 kg/m?   ?Constitutional:  Generally well appearing female in no acute distress. ?Psychiatric: Pleasant. Normal mood and affect. Behavior is normal. ?EENT: Pupils normal.  Conjunctivae are normal. No scleral icterus. ?Neck supple.  ?Cardiovascular: Normal rate, regular rhythm. No edema ?Pulmonary/chest: Effort normal and breath sounds normal. No wheezing, rales or rhonchi. ?Abdominal: Soft, nondistended, nontender. Bowel sounds active throughout. There are no masses palpable. No hepatomegaly. ?Neurological: Alert and oriented to person place and time. ?Skin: Skin is warm and dry. No rashes noted. ? ?Tye Savoy, NP  04/12/2021, 9:34 AM ? ?Cc:  ?Referring Provider ?Jearld Fenton, NP ? ? ? ? ? ? ? ?

## 2021-04-12 NOTE — Progress Notes (Signed)
I agree with the above note, plan 

## 2021-04-13 ENCOUNTER — Other Ambulatory Visit: Payer: Self-pay

## 2021-04-13 ENCOUNTER — Ambulatory Visit: Payer: Medicare Other

## 2021-04-13 DIAGNOSIS — C50511 Malignant neoplasm of lower-outer quadrant of right female breast: Secondary | ICD-10-CM | POA: Diagnosis not present

## 2021-04-13 DIAGNOSIS — M25612 Stiffness of left shoulder, not elsewhere classified: Secondary | ICD-10-CM

## 2021-04-13 DIAGNOSIS — R293 Abnormal posture: Secondary | ICD-10-CM

## 2021-04-13 DIAGNOSIS — M79602 Pain in left arm: Secondary | ICD-10-CM

## 2021-04-13 NOTE — Therapy (Signed)
[facility] ?[facility] [place] ?[place], [place], [address] ?Phone: [phone]   Fax:  [phone] ? ?Physical Therapy Treatment ? ?Patient Details  ?Name: Carrie Singh ?MRN: [id number] ?Date of Birth: 01/24/1955 ?Referring Provider (PT): Thedore Mins ? ? ?Encounter Date: 04/13/2021 ? ? PT End of Session - 04/13/21 0906   ? ? Visit Number 3   ? Number of Visits 14   ? Date for PT Re-Evaluation 05/19/21   ? PT Start Time [phone]   ? PT Stop Time [phone]   ? PT Time Calculation (min) 46 min   ? Activity Tolerance Patient tolerated treatment well   ? Behavior During Therapy [facility] for tasks assessed/performed   ? ?  ?  ? ?  ? ? ?Past Medical History:  ?Diagnosis Date  ? Breast cancer ([place]) 01/2021  ? left breast IDC  ? Breast cancer, right breast ([place]) 12/2007  ? a. s/p chemo, radiation, and lumpectomy with negative sentinel lymph node biopsy   ? Complication of anesthesia   ? woke up during colonoscopy  ? Depression   ? Diabetes mellitus without complication ([place])   ? GERD (gastroesophageal reflux disease)   ? Hyperlipidemia   ? Hypertension   ? resistant, negative renal Dopplers and negative CT angiogram  ? Migraines   ? Obesity   ? ? ?Past Surgical History:  ?Procedure Laterality Date  ? ABDOMINAL HYSTERECTOMY    ? BREAST LUMPECTOMY    ? CYST EXCISION    ? FOOT SURGERY    ? MASTECTOMY Right   ? MASTECTOMY W/ SENTINEL NODE BIOPSY Right 03/18/2016  ? Procedure: RIGHT MASTECTOMY WITH RIGHT AXILLARY SENTINEL LYMPH NODE BIOPSY;  Surgeon: Alphonsa Overall, MD;  Location: [facility];  Service: General;  Laterality: Right;  ? MASTECTOMY W/ SENTINEL NODE BIOPSY Left 03/10/2021  ? Procedure: LEFT MASTECTOMY WITH LEFT SENTINEL LYMPH NODE BIOPSY;  Surgeon: Erroll Luna, MD;  Location: [facility];  Service: General;  Laterality: Left;  ? ovary tumor    ? vocal cord nodules    ? ? ?There were no vitals filed for this visit. ? ? Subjective Assessment - 04/13/21 0905   ? ?  Subjective I am feeling better. I have been doing exercises 2 times a day.  Everything is getting better. I still feel cording. I can reach higher and everything is better. I am wearing the foam pad and it helps under the arm.   ? Pertinent History She has a history of right breast cancer treated initially back in 2009 with breast conserving surgery. She had additional cancer in 2018 and right breast underwent simple mastectomy. She had symptom mapping both times and radiation with her lumpectomy. 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 She had a left mastectomy on 03/10/2021. It was ER =, PR0, HER 2 - with a Ki 49 of 15% Pt has a hx of renal disease, DM, shingles. She is on disability. Right shoulder ROM has been limited since prior surgeries.  she never had rehab.   ? Patient Stated Goals post surgical reassessment   ? Currently in Pain? Yes   ? Pain Score 4    ? Pain Location Arm   and axilla  ? Pain Orientation Left   ? Pain Descriptors / Indicators Tightness;Sore;Tender;Aching   ?  Pain Type Surgical pain   ? Pain Onset More than a month ago   ? Pain Frequency Intermittent   ? Aggravating Factors  exercises are uncomfortable, but overall its bettter   ? ?  ?  ? ?  ? ? ? ? ? ? ? ? ? ? ? ? ? ? ? ? ? ? ? ? [facility] Adult PT Treatment/Exercise - 04/13/21 0001   ? ?  ? Shoulder Exercises: Pulleys  ? Flexion 2 minutes   ? Flexion Limitations return demonstrated   ? Scaption 1 minute   ? ABduction 2 minutes   ? ABduction Limitations return demonstrated   ?  ? Shoulder Exercises: Therapy Ball  ? Flexion Both;5 reps   ?  ? Manual Therapy  ? Manual therapy comments Medium TG soft cut for left arm to improve comfort with cording   ? Soft tissue mobilization Soft tissue mobilization to left pecs, lats, UT with cocoa butter   ? Myofascial Release MFR to cording in left upper arm,  antecubital fossa and forearm   ? Passive ROM PROM to left shoulder flexion, scaption, abduction, ER   ? ?  ?  ? ?  ? ? ? ? ? ? ? ? ? ? ? ? ? ? ? PT Long Term Goals - 04/07/21 2050   ? ?  ? PT LONG TERM GOAL #1  ? Title Pt will restore left shoulder ROM to within 5-10 degrees of pre-op levels for functional use of left UE   ? Time 6   ? Period Weeks   ? Status On-going   ? Target Date 05/19/21   ?  ? PT LONG TERM GOAL #2  ? Title Pt will have decreased axillary and arm pain from cording by 50% or more   ? Time 6   ? Period Weeks   ? Status New   ? Target Date 05/19/21   ?  ? PT LONG TERM GOAL #3  ? Title Pt will attend ABC classs   ? Time 4   ? Period Weeks   ? Status New   ? Target Date 05/19/21   ?  ? PT LONG TERM GOAL #4  ? Title Pt will be fit for prophylactic compression sleeve/gauntlet   ? Time 6   ? Period Weeks   ? Status New   ? Target Date 05/19/21   ?  ? PT LONG TERM GOAL #5  ? Title Pt willhave atleast 130 degrees of left shoulder flexion and abduction for improved ability to perform home activities   ? Time 6   ? Period Weeks   ? Status New   ? Target Date 05/19/21   ? ?  ?  ? ?  ? ? ? ? ? ? ? ? Plan - 04/13/21 0924   ? ? Clinical Impression Statement Pt performed pulleys and ball rolls to warm up prior to therapist working on her.  Soft tissue work performed to left upper quarter in supine, followed by PROM and MFR to areas of cording on left. Pt has made excellent progress with shoulder ROM, and is now able to do most things at home without her husbands help. Cording still very present with a thick painful cord in the axillary region.   ? Personal Factors and Comorbidities Comorbidity 3+   ? Comorbidities Active CA, prior right breast surgeries/infection, DM, kidney disease   ? Stability/Clinical Decision Making Stable/Uncomplicated   ? Rehab Potential Good   ?  PT Frequency 2x / week   ? PT Duration 6 weeks   ? PT Treatment/Interventions ADLs/Self Care Home Management;Therapeutic  exercise;Patient/family education;Scar mobilization;Orthotic Fit/Training;Manual techniques;Passive range of motion;Manual lymph drainage;Compression bandaging   ? PT Next Visit Plan single arm chest stretch with NTS, AAROM, STM left upper quarter, PROM, MLD prn   ? PT Home Exercise Plan 4 post op exercises   ? Consulted and Agree with Plan of Care Patient   ? ?  ?  ? ?  ? ? ?Patient will benefit from skilled therapeutic intervention in order to improve the following deficits and impairments:  Decreased range of motion, Decreased knowledge of precautions, Postural dysfunction, Impaired UE functional use, Pain, Decreased scar mobility, Impaired sensation, Increased edema, Decreased mobility, Decreased strength ? ?Visit Diagnosis: ?Malignant neoplasm of lower-outer quadrant of right female breast, unspecified estrogen receptor status ([place]) ? ?Abnormal posture ? ?Stiffness of left shoulder, not elsewhere classified ? ?Pain in left arm ? ? ? ? ?Problem List ?Patient Active Problem List  ? Diagnosis Date Noted  ? Class 1 obesity due to excess calories with body mass index (BMI) of 34.0 to 34.9 in adult 08/21/2020  ? CKD (chronic kidney disease) stage 3, GFR 30-59 ml/min (H[facility]) 05/30/2019  ? GERD (gastroesophageal reflux disease) 11/30/2017  ? Intractable episodic cluster headache 12/04/2014  ? Depression with somatization 12/04/2014  ? HTN (hypertension) 04/24/2013  ? HLD (hyperlipidemia) 04/24/2013  ? DM2 (diabetes mellitus, type 2) ([place]) 04/24/2013  ? Malignant neoplasm of lower-outer quadrant of right breast of female, estrogen receptor positive ([place]) 12/18/2007  ? ? ?Claris Pong, PT ?04/13/2021, 10:00 AM ? ?[facility] ?[facility] [place] ?[place], [place], [address] ?Phone: [phone]   Fax:  [phone] ? ?Name: ANAKA BEAZER ?MRN: [id number] ?Date of Birth: 04-Jan-1956 ? ? ? ?

## 2021-04-15 ENCOUNTER — Ambulatory Visit: Payer: Medicare Other

## 2021-04-15 DIAGNOSIS — R293 Abnormal posture: Secondary | ICD-10-CM | POA: Diagnosis not present

## 2021-04-15 DIAGNOSIS — M25612 Stiffness of left shoulder, not elsewhere classified: Secondary | ICD-10-CM

## 2021-04-15 DIAGNOSIS — C50511 Malignant neoplasm of lower-outer quadrant of right female breast: Secondary | ICD-10-CM

## 2021-04-15 DIAGNOSIS — M79602 Pain in left arm: Secondary | ICD-10-CM

## 2021-04-15 NOTE — Therapy (Signed)
[facility] ?[facility] [place] ?[place], [place], [address] ?Phone: [phone]   Fax:  [phone] ? ?Physical Therapy Treatment ? ?Patient Details  ?Name: Carrie Singh ?MRN: [id number] ?Date of Birth: 05-24-1955 ?Referring Provider (PT): Thedore Mins ? ? ?Encounter Date: 04/15/2021 ? ? PT End of Session - 04/15/21 1009   ? ? Visit Number 4   ? Number of Visits 14   ? Date for PT Re-Evaluation 05/19/21   ? PT Start Time 0908   ? PT Stop Time 1008   ? PT Time Calculation (min) 60 min   ? Activity Tolerance Patient tolerated treatment well   ? Behavior During Therapy [facility] for tasks assessed/performed   ? ?  ?  ? ?  ? ? ?Past Medical History:  ?Diagnosis Date  ? Breast cancer ([place]) 01/2021  ? left breast IDC  ? Breast cancer, right breast ([place]) 12/2007  ? a. s/p chemo, radiation, and lumpectomy with negative sentinel lymph node biopsy   ? Complication of anesthesia   ? woke up during colonoscopy  ? Depression   ? Diabetes mellitus without complication ([place])   ? GERD (gastroesophageal reflux disease)   ? Hyperlipidemia   ? Hypertension   ? resistant, negative renal Dopplers and negative CT angiogram  ? Migraines   ? Obesity   ? ? ?Past Surgical History:  ?Procedure Laterality Date  ? ABDOMINAL HYSTERECTOMY    ? BREAST LUMPECTOMY    ? CYST EXCISION    ? FOOT SURGERY    ? MASTECTOMY Right   ? MASTECTOMY W/ SENTINEL NODE BIOPSY Right 03/18/2016  ? Procedure: RIGHT MASTECTOMY WITH RIGHT AXILLARY SENTINEL LYMPH NODE BIOPSY;  Surgeon: Alphonsa Overall, MD;  Location: [facility];  Service: General;  Laterality: Right;  ? MASTECTOMY W/ SENTINEL NODE BIOPSY Left 03/10/2021  ? Procedure: LEFT MASTECTOMY WITH LEFT SENTINEL LYMPH NODE BIOPSY;  Surgeon: Erroll Luna, MD;  Location: [facility];  Service: General;  Laterality: Left;  ? ovary tumor    ? vocal cord nodules    ? ? ?There were no vitals filed for this visit. ? ? Subjective Assessment - 04/15/21 0914   ? ?  Subjective My reaching has gotten better since I've been coming to therapy.   ? Pertinent History She has a history of right breast cancer treated initially back in 2009 with breast conserving surgery. She had additional cancer in 2018 and right breast underwent simple mastectomy. She had symptom mapping both times and radiation with her lumpectomy. 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 She had a left mastectomy on 03/10/2021. It was ER =, PR0, HER 2 - with a Ki 72 of 15% Pt has a hx of renal disease, DM, shingles. She is on disability. Right shoulder ROM has been limited since prior surgeries.  she never had rehab.   ? Patient Stated Goals post surgical reassessment   ? Currently in Pain? Yes   ? Pain Score 3    ? Pain Location Axilla   and axilla  ? Pain Orientation Left   ? Pain Descriptors / Indicators Tightness   pulling  ? Pain Type Surgical pain   ? Pain Onset More than a month ago   ? Pain Frequency Intermittent   ? Aggravating Factors  end of  stretch hurts   ? Pain Relieving Factors physical therapy   ? ?  ?  ? ?  ? ? ? ? ? ? ? ? ? ? ? ? ? ? ? ? ? ? ? ? [facility] Adult PT Treatment/Exercise - 04/15/21 0001   ? ?  ? Shoulder Exercises: Pulleys  ? Flexion 2 minutes   ? Flexion Limitations Tactile cues to decrease Lt scapular compensation   ? ABduction 2 minutes   ? ABduction Limitations Tactile cues to decrease Lt scapular compensation   ?  ? Shoulder Exercises: Therapy Ball  ? Flexion Both;10 reps   foward lean into end of stretch  ?  ? Manual Therapy  ? Soft tissue mobilization Soft tissue mobilization to left pecs, lats, UT with cocoa butter   ? Myofascial Release MFR to cording in left upper arm, antecubital fossa and forearm   ? Passive ROM PROM to left shoulder flexion, scaption, abduction, ER   ? ?  ?  ? ?  ? ? ? ? ? ? ? ? ? ? ? ? ? ? ? PT Long Term Goals -  04/07/21 2050   ? ?  ? PT LONG TERM GOAL #1  ? Title Pt will restore left shoulder ROM to within 5-10 degrees of pre-op levels for functional use of left UE   ? Time 6   ? Period Weeks   ? Status On-going   ? Target Date 05/19/21   ?  ? PT LONG TERM GOAL #2  ? Title Pt will have decreased axillary and arm pain from cording by 50% or more   ? Time 6   ? Period Weeks   ? Status New   ? Target Date 05/19/21   ?  ? PT LONG TERM GOAL #3  ? Title Pt will attend ABC classs   ? Time 4   ? Period Weeks   ? Status New   ? Target Date 05/19/21   ?  ? PT LONG TERM GOAL #4  ? Title Pt will be fit for prophylactic compression sleeve/gauntlet   ? Time 6   ? Period Weeks   ? Status New   ? Target Date 05/19/21   ?  ? PT LONG TERM GOAL #5  ? Title Pt willhave atleast 130 degrees of left shoulder flexion and abduction for improved ability to perform home activities   ? Time 6   ? Period Weeks   ? Status New   ? Target Date 05/19/21   ? ?  ?  ? ?  ? ? ? ? ? ? ? ? Plan - 04/15/21 1055   ? ? Clinical Impression Statement Continued with ball roll up wall and pulleys where pt requires tactile cues to decrease Lt scapular compensation but able to correct with cuing. Then continued with manual therapy continuing to focus on decreasing fascial restrictions still present from cording at Lt axilla and chest. Pt reports feeling much looser after session.   ? Personal Factors and Comorbidities Comorbidity 3+   ? Comorbidities Active CA, prior right breast surgeries/infection, DM, kidney disease   ? Stability/Clinical Decision Making Stable/Uncomplicated   ? Rehab Potential Good   ? PT Frequency 2x / week   ? PT Duration 6 weeks   ? PT Treatment/Interventions ADLs/Self Care Home Management;Therapeutic exercise;Patient/family education;Scar mobilization;Orthotic Fit/Training;Manual techniques;Passive range of motion;Manual lymph drainage;Compression bandaging   ? PT Next Visit Plan single arm chest stretch with NTS, AAROM, STM left upper  quarter,  PROM, MLD prn   ? PT Home Exercise Plan 4 post op exercises   ? Consulted and Agree with Plan of Care Patient   ? ?  ?  ? ?  ? ? ?Patient will benefit from skilled therapeutic intervention in order to improve the following deficits and impairments:  Decreased range of motion, Decreased knowledge of precautions, Postural dysfunction, Impaired UE functional use, Pain, Decreased scar mobility, Impaired sensation, Increased edema, Decreased mobility, Decreased strength ? ?Visit Diagnosis: ?Malignant neoplasm of lower-outer quadrant of right female breast, unspecified estrogen receptor status ([place]) ? ?Abnormal posture ? ?Stiffness of left shoulder, not elsewhere classified ? ?Pain in left arm ? ? ? ? ?Problem List ?Patient Active Problem List  ? Diagnosis Date Noted  ? Class 1 obesity due to excess calories with body mass index (BMI) of 34.0 to 34.9 in adult 08/21/2020  ? CKD (chronic kidney disease) stage 3, GFR 30-59 ml/min (HCC) 05/30/2019  ? GERD (gastroesophageal reflux disease) 11/30/2017  ? Intractable episodic cluster headache 12/04/2014  ? Depression with somatization 12/04/2014  ? HTN (hypertension) 04/24/2013  ? HLD (hyperlipidemia) 04/24/2013  ? DM2 (diabetes mellitus, type 2) ([place]) 04/24/2013  ? Malignant neoplasm of lower-outer quadrant of right breast of female, estrogen receptor positive ([place]) 12/18/2007  ? ? ?Otelia Limes, PTA ?04/15/2021, 10:58 AM ? ?[facility] ?[facility] [place] ?[place], [place], [address] ?Phone: [phone]   Fax:  [phone] ? ?Name: KIYRA SLAUBAUGH ?MRN: [id number] ?Date of Birth: 1955/06/26 ? ? ? ?

## 2021-04-16 ENCOUNTER — Ambulatory Visit (AMBULATORY_SURGERY_CENTER): Payer: Medicare Other | Admitting: Gastroenterology

## 2021-04-16 ENCOUNTER — Encounter: Payer: Self-pay | Admitting: Gastroenterology

## 2021-04-16 VITALS — BP 187/86 | HR 84 | Temp 98.3°F | Resp 15 | Ht 65.0 in | Wt 211.0 lb

## 2021-04-16 DIAGNOSIS — Z1211 Encounter for screening for malignant neoplasm of colon: Secondary | ICD-10-CM

## 2021-04-16 DIAGNOSIS — Z86010 Personal history of colonic polyps: Secondary | ICD-10-CM

## 2021-04-16 DIAGNOSIS — D122 Benign neoplasm of ascending colon: Secondary | ICD-10-CM | POA: Diagnosis not present

## 2021-04-16 DIAGNOSIS — Z800 Family history of malignant neoplasm of digestive organs: Secondary | ICD-10-CM

## 2021-04-16 DIAGNOSIS — D123 Benign neoplasm of transverse colon: Secondary | ICD-10-CM

## 2021-04-16 DIAGNOSIS — D124 Benign neoplasm of descending colon: Secondary | ICD-10-CM | POA: Diagnosis not present

## 2021-04-16 DIAGNOSIS — E119 Type 2 diabetes mellitus without complications: Secondary | ICD-10-CM | POA: Diagnosis not present

## 2021-04-16 DIAGNOSIS — I10 Essential (primary) hypertension: Secondary | ICD-10-CM | POA: Diagnosis not present

## 2021-04-16 DIAGNOSIS — D128 Benign neoplasm of rectum: Secondary | ICD-10-CM

## 2021-04-16 MED ORDER — SODIUM CHLORIDE 0.9 % IV SOLN: 500.0000 mL | Freq: Once | INTRAVENOUS | Status: DC

## 2021-04-16 NOTE — Progress Notes (Signed)
?  The recent H&P (dated 04/12/21) was reviewed, the patient was examined and there is no change in the patients condition since that H&P was completed. ? ? ?Carrie Singh  04/16/2021, 2:49 PM ? ?

## 2021-04-16 NOTE — Patient Instructions (Signed)
THank you for letting us take care of your healthcare needs today. ?Please see handouts given to you on Polyps, Diverticulosis and Hemorrhoids. ? ? ? ?YOU HAD AN ENDOSCOPIC PROCEDURE TODAY AT [facility]:   Refer to the procedure report that was given to you for any specific questions about what was found during the examination.  If the procedure report does not answer your questions, please call your gastroenterologist to clarify.  If you requested that your care partner not be given the details of your procedure findings, then the procedure report has been included in a sealed envelope for you to review at your convenience later. ? ?YOU SHOULD EXPECT: Some feelings of bloating in the abdomen. Passage of more gas than usual.  Walking can help get rid of the air that was put into your GI tract during the procedure and reduce the bloating. If you had a lower endoscopy (such as a colonoscopy or flexible sigmoidoscopy) you may notice spotting of blood in your stool or on the toilet paper. If you underwent a bowel prep for your procedure, you may not have a normal bowel movement for a few days. ? ?Please Note:  You might notice some irritation and congestion in your nose or some drainage.  This is from the oxygen used during your procedure.  There is no need for concern and it should clear up in a day or so. ? ?SYMPTOMS TO REPORT IMMEDIATELY: ? ?Following lower endoscopy (colonoscopy or flexible sigmoidoscopy): ? Excessive amounts of blood in the stool ? Significant tenderness or worsening of abdominal pains ? Swelling of the abdomen that is new, acute ? Fever of 100?F or higher ? ? ?For urgent or emergent issues, a gastroenterologist can be reached at any hour by calling [phone]. ?Do not use MyChart messaging for urgent concerns.  ? ? ?DIET:  We do recommend a small meal at first, but then you may proceed to your regular diet.  Drink plenty of fluids but you should avoid alcoholic beverages for  24 hours. ? ?ACTIVITY:  You should plan to take it easy for the rest of today and you should NOT DRIVE or use heavy machinery until tomorrow (because of the sedation medicines used during the test).   ? ?FOLLOW UP: ?Our staff will call the number listed on your records 48-72 hours following your procedure to check on you and address any questions or concerns that you may have regarding the information given to you following your procedure. If we do not reach you, we will leave a message.  We will attempt to reach you two times.  During this call, we will ask if you have developed any symptoms of COVID 19. If you develop any symptoms (ie: fever, flu-like symptoms, shortness of breath, cough etc.) before then, please call [phone].  If you test positive for Covid 19 in the 2 weeks post procedure, please call and report this information to [place].   ? ?If any biopsies were taken you will be contacted by phone or by letter within the next 1-3 weeks.  Please call us at [phone] if you have not heard about the biopsies in 3 weeks.  ? ? ?SIGNATURES/CONFIDENTIALITY: ?You and/or your care partner have signed paperwork which will be entered into your electronic medical record.  These signatures attest to the fact that that the information above on your After Visit Summary has been reviewed and is understood.  Full responsibility of the confidentiality of this discharge  information lies with you and/or your care-partner.  ?

## 2021-04-16 NOTE — Progress Notes (Signed)
1503 BP 183/90, Labetalol given IV, MD update, vss  ?

## 2021-04-16 NOTE — Progress Notes (Signed)
Report given to PACU, vss 

## 2021-04-16 NOTE — Op Note (Signed)
[facility] ?Patient Name: Carrie Singh ?Procedure Date: 04/16/2021 2:53 PM ?MRN: [id number] ?Endoscopist: Milus Banister , MD ?Age: 66 ?Referring MD:  ?Date of Birth: 07-06-1955 ?Gender: Female ?Account #: [id number] ?Procedure:                Colonoscopy ?Indications:              Screening in patient at increased risk: Brother  ?                          with CRC in his 45s, Father with CRC in his 70s ?Medicines:                Monitored Anesthesia Care ?Procedure:                Pre-Anesthesia Assessment: ?                          - Prior to the procedure, a History and Physical  ?                          was performed, and patient medications and  ?                          allergies were reviewed. The patient's tolerance of  ?                          previous anesthesia was also reviewed. The risks  ?                          and benefits of the procedure and the sedation  ?                          options and risks were discussed with the patient.  ?                          All questions were answered, and informed consent  ?                          was obtained. Prior Anticoagulants: The patient has  ?                          taken no previous anticoagulant or antiplatelet  ?                          agents. ASA Grade Assessment: II - A patient with  ?                          mild systemic disease. After reviewing the risks  ?                          and benefits, the patient was deemed in  ?                          satisfactory condition to undergo the procedure. ?  After obtaining informed consent, the colonoscope  ?                          was passed under direct vision. Throughout the  ?                          procedure, the patient's blood pressure, pulse, and  ?                          oxygen saturations were monitored continuously. The  ?                          Olympus CF-HQ190L (Serial# [id number]) Colonoscope was  ?                          introduced  through the anus and advanced to the the  ?                          cecum, identified by appendiceal orifice and  ?                          ileocecal valve. The colonoscopy was performed  ?                          without difficulty. The patient tolerated the  ?                          procedure well. The quality of the bowel  ?                          preparation was good. The ileocecal valve,  ?                          appendiceal orifice, and rectum were photographed. ?Scope In: 3:00:42 PM ?Scope Out: 3:12:25 PM ?Scope Withdrawal Time: 0 hours 8 minutes 25 seconds  ?Total Procedure Duration: 0 hours 11 minutes 43 seconds  ?Findings:                 Five sessile polyps were found in the rectum,  ?                          descending colon, transverse colon and ascending  ?                          colon. The polyps were 3 to 8 mm in size. These  ?                          polyps were removed with a cold snare. Resection  ?                          and retrieval were complete. ?                          Multiple small and large-mouthed diverticula were  ?  found in the left colon. ?                          Internal hemorrhoids were found. The hemorrhoids  ?                          were small. ?                          The exam was otherwise without abnormality on  ?                          direct and retroflexion views. ?Complications:            No immediate complications. Estimated blood loss:  ?                          None. ?Estimated Blood Loss:     Estimated blood loss: none. ?Impression:               - Five 3 to 8 mm polyps in the rectum, in the  ?                          descending colon, in the transverse colon and in  ?                          the ascending colon, removed with a cold snare.  ?                          Resected and retrieved. ?                          - Diverticulosis in the left colon. ?                          - Internal hemorrhoids. ?                           - The examination was otherwise normal on direct  ?                          and retroflexion views. ?Recommendation:           - Patient has a contact number available for  ?                          emergencies. The signs and symptoms of potential  ?                          delayed complications were discussed with the  ?                          patient. Return to normal activities tomorrow.  ?                          Written discharge instructions were provided to the  ?  patient. ?                          - Resume previous diet. ?                          - Continue present medications. ?                          - Await pathology results. ?Milus Banister, MD ?04/16/2021 3:14:27 PM ?This report has been signed electronically. ?

## 2021-04-16 NOTE — Progress Notes (Signed)
Called to room to assist during endoscopic procedure.  Patient ID and intended procedure confirmed with present staff. Received instructions for my participation in the procedure from the performing physician.  

## 2021-04-20 ENCOUNTER — Ambulatory Visit: Payer: Medicare Other | Attending: Adult Health

## 2021-04-20 ENCOUNTER — Telehealth: Payer: Self-pay

## 2021-04-20 DIAGNOSIS — R293 Abnormal posture: Secondary | ICD-10-CM | POA: Insufficient documentation

## 2021-04-20 DIAGNOSIS — M79602 Pain in left arm: Secondary | ICD-10-CM | POA: Insufficient documentation

## 2021-04-20 DIAGNOSIS — C50511 Malignant neoplasm of lower-outer quadrant of right female breast: Secondary | ICD-10-CM | POA: Insufficient documentation

## 2021-04-20 DIAGNOSIS — M25612 Stiffness of left shoulder, not elsewhere classified: Secondary | ICD-10-CM | POA: Diagnosis not present

## 2021-04-20 NOTE — Therapy (Signed)
[facility] ?[facility] ?[place], [place], [address] ?Phone: [phone]   Fax:  [phone] ? ?Physical Therapy Treatment ? ?Patient Details  ?Name: Carrie Singh ?MRN: [id number] ?Date of Birth: [birth year] ?Referring Provider (PT): Thedore Mins ? ? ?Encounter Date: 04/20/2021 ? ? PT End of Session - 04/20/21 0903   ? ? Visit Number 5   ? Number of Visits 14   ? Date for PT Re-Evaluation 05/19/21   ? PT Start Time [phone]   ? PT Stop Time [phone]   ? PT Time Calculation (min) 49 min   ? Activity Tolerance Patient tolerated treatment well   ? Behavior During Therapy [facility] for tasks assessed/performed   ? ?  ?  ? ?  ? ? ?Past Medical History:  ?Diagnosis Date  ? Breast cancer ([place]) 01/2021  ? left breast IDC  ? Breast cancer, right breast ([place]) 12/2007  ? a. s/p chemo, radiation, and lumpectomy with negative sentinel lymph node biopsy   ? Complication of anesthesia   ? woke up during colonoscopy  ? Depression   ? Diabetes mellitus without complication ([place])   ? GERD (gastroesophageal reflux disease)   ? Hyperlipidemia   ? Hypertension   ? resistant, negative renal Dopplers and negative CT angiogram  ? Migraines   ? Obesity   ? ? ?Past Surgical History:  ?Procedure Laterality Date  ? ABDOMINAL HYSTERECTOMY    ? BREAST LUMPECTOMY    ? CYST EXCISION    ? FOOT SURGERY    ? MASTECTOMY Right   ? MASTECTOMY W/ SENTINEL NODE BIOPSY Right 03/18/2016  ? Procedure: RIGHT MASTECTOMY WITH RIGHT AXILLARY SENTINEL LYMPH NODE BIOPSY;  Surgeon: Alphonsa Overall, MD;  Location: [facility];  Service: General;  Laterality: Right;  ? MASTECTOMY W/ SENTINEL NODE BIOPSY Left 03/10/2021  ? Procedure: LEFT MASTECTOMY WITH LEFT SENTINEL LYMPH NODE BIOPSY;  Surgeon: Erroll Luna, MD;  Location: [facility];  Service: General;  Laterality: Left;  ? ovary tumor    ? vocal cord nodules    ? ? ?There were no vitals filed for this visit. ? ? Subjective Assessment - 04/20/21 0903   ? ?  Subjective My left arm is doing really well. The pain is better by 70% overall. I can about do everything now. I can dress by myself and I don't need any help from my husband now. I still have the cord in my arm. I still wear the sleeve and massage it every day   ? Pertinent History She has a history of right breast cancer treated initially back in 2009 with breast conserving surgery. She had additional cancer in 2018 and right breast underwent simple mastectomy. She had symptom mapping both times and radiation with her lumpectomy. 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 She had a left mastectomy on 03/10/2021. It was ER =, PR0, HER 2 - with a Ki 35 of 15% Pt has a hx of renal disease, DM, shingles. She is on disability. Right shoulder ROM has been limited since prior surgeries.  she never had rehab.   ? Patient Stated Goals post surgical reassessment   ? Currently in Pain? Yes   ? Pain Score 1    ? Pain Location Axilla   ? Pain Orientation Left   ?  Pain Descriptors / Indicators Tightness   ? Pain Onset More than a month ago   ? Pain Frequency Intermittent   ? ?  ?  ? ?  ? ? ? ? ? [facility] PT Assessment - 04/20/21 0001   ? ?  ? Assessment  ? Medical Diagnosis Left breast Cancer   ? Referring Provider (PT) Thedore Mins   ? Onset Date/Surgical Date 03/10/21   ? Hand Dominance Right   ?  ? Precautions  ? Precaution Comments lymphedema risk   ?  ? Prior Function  ? Level of Independence Independent   ?  ? AROM  ? Left Shoulder Extension 72 Degrees   ? Left Shoulder Flexion 162 Degrees   ? Left Shoulder ABduction 175 Degrees   ? Left Shoulder External Rotation 105 Degrees   ? ?  ?  ? ?  ? ? ? ? ? ? ? ? ? ? ? ? ? ? ? ? [facility] Adult PT Treatment/Exercise - 04/20/21 0001   ? ?  ? Shoulder Exercises: Standing  ? Other Standing Exercises Pectoralis wall stretch x 3 (20 sec) (reported  shoulder pain afterwards  ?  ? Shoulder Exercises: Pulleys  ? Flexion 2 minutes   ? Flexion Limitations Tactile cues to decrease Lt scapular compensation   ? ABduction 2 minutes   ? ABduction Limitations Tactile cues to decrease Lt scapular compensation   ?  ?   ?    ?  ? Manual Therapy  ? Soft tissue mobilization Soft tissue mobilization to left pecs, lats, UT with cocoa butter   ? Myofascial Release MFR to cording in left upper arm, antecubital fossa and forearm   ? Passive ROM PROM to left shoulder flexion, scaption, abduction, ER   ? ?  ?  ? ?  ? ? ? ? ? ? ? ? ? ? ? ? ? ? ? PT Long Term Goals - 04/20/21 0921   ? ?  ? PT LONG TERM GOAL #1  ? Title Pt will restore left shoulder ROM to within 5-10 degrees of pre-op levels for functional use of left UE   ? Time 6   ? Period Weeks   ? Status Achieved   ? Target Date 04/20/21   ?  ? PT LONG TERM GOAL #2  ? Title Pt will have decreased axillary and arm pain from cording by 50% or more   ? Baseline 70%   ? Time 6   ? Period Weeks   ? Status Achieved   ? Target Date 04/20/21   ?  ? PT LONG TERM GOAL #3  ? Title Pt will attend ABC classs   ? Time 4   ? Period Weeks   ? Status On-going   ? Target Date 05/19/21   ?  ? PT LONG TERM GOAL #4  ? Title Pt will be fit for prophylactic compression sleeve/gauntlet   ? Time 6   ? Period Weeks   ? Status On-going   ? Target Date 05/19/21   ?  ? PT LONG TERM GOAL #5  ? Title Pt willhave atleast 130 degrees of left shoulder flexion and abduction for improved ability to perform home activities   ? Time 6   ? Period Weeks   ? Status Achieved   ? Target Date 04/20/21   ? ?  ?  ? ?  ? ? ? ? ? ? ? ? Plan - 04/20/21 0954   ? ?  Clinical Impression Statement pt has achieved Shoulder ROM and pain goals from cording. Cording is still present, but not limiting ROM.  She is very compliant with her HEP.  She was scheduled again for the ABC class, and still needs to be measured for a compression sleeve.  She does complain of some left shoulder pain  with IR activities and end range flexion and will benefit from addition of strengthening exercises. Will initiate theraband exercises next visit.   ? Personal Factors and Comorbidities Comorbidity 3+   ? Comorbidities Active CA, prior right breast surgeries/infection, DM, kidney disease   ? Stability/Clinical Decision Making Stable/Uncomplicated   ? Rehab Potential Good   ? PT Frequency 2x / week   ? PT Duration 6 weeks   ? PT Treatment/Interventions ADLs/Self Care Home Management;Therapeutic exercise;Patient/family education;Scar mobilization;Orthotic Fit/Training;Manual techniques;Passive range of motion;Manual lymph drainage;Compression bandaging   ? PT Next Visit Plan continue cording release, measure for compression sleeve if pt wants, instruct theraband exercises and issue HEP, chest stretch   ? PT Home Exercise Plan 4 post op exercises   ? Consulted and Agree with Plan of Care Patient   ? ?  ?  ? ?  ? ? ?Patient will benefit from skilled therapeutic intervention in order to improve the following deficits and impairments:  Decreased range of motion, Decreased knowledge of precautions, Postural dysfunction, Impaired UE functional use, Pain, Decreased scar mobility, Impaired sensation, Increased edema, Decreased mobility, Decreased strength ? ?Visit Diagnosis: ?Malignant neoplasm of lower-outer quadrant of right female breast, unspecified estrogen receptor status ([place]) ? ?Abnormal posture ? ?Stiffness of left shoulder, not elsewhere classified ? ?Pain in left arm ? ? ? ? ?Problem List ?Patient Active Problem List  ? Diagnosis Date Noted  ? Class 1 obesity due to excess calories with body mass index (BMI) of 34.0 to 34.9 in adult 08/21/2020  ? CKD (chronic kidney disease) stage 3, GFR 30-59 ml/min ([facility]) 05/30/2019  ? GERD (gastroesophageal reflux disease) 11/30/2017  ? Intractable episodic cluster headache 12/04/2014  ? Depression with somatization 12/04/2014  ? HTN (hypertension) 04/24/2013  ? HLD  (hyperlipidemia) 04/24/2013  ? DM2 (diabetes mellitus, type 2) ([place]) 04/24/2013  ? Malignant neoplasm of lower-outer quadrant of right breast of female, estrogen receptor positive ([place]) 12/18/2007  ? ? ?Claris Pong, PT

## 2021-04-20 NOTE — Telephone Encounter (Signed)
?  Follow up Call- ? ? ?  04/16/2021  ?  2:27 PM  ?Call back number  ?Post procedure Call Back phone  # [phone]  ?Permission to leave phone message Yes  ?  ? ?Patient questions: ? ?Do you have a fever, pain , or abdominal swelling? No. ?Pain Score  0 * ? ?Have you tolerated food without any problems? Yes.   ? ?Have you been able to return to your normal activities? Yes.   ? ?Do you have any questions about your discharge instructions: ?Diet   No. ?Medications  No. ?Follow up visit  No. ? ?Do you have questions or concerns about your Care? No. ? ?Actions: ?* If pain score is 4 or above: ?No action needed, pain <4. ? ? ?

## 2021-04-25 ENCOUNTER — Encounter: Payer: Self-pay | Admitting: Gastroenterology

## 2021-04-27 ENCOUNTER — Ambulatory Visit: Payer: Medicare Other

## 2021-04-27 DIAGNOSIS — M79602 Pain in left arm: Secondary | ICD-10-CM

## 2021-04-27 DIAGNOSIS — R293 Abnormal posture: Secondary | ICD-10-CM

## 2021-04-27 DIAGNOSIS — C50511 Malignant neoplasm of lower-outer quadrant of right female breast: Secondary | ICD-10-CM | POA: Diagnosis not present

## 2021-04-27 DIAGNOSIS — M25612 Stiffness of left shoulder, not elsewhere classified: Secondary | ICD-10-CM

## 2021-04-27 NOTE — Therapy (Addendum)
[place] ?[facility] ?[place], [place], [address] ?Phone: [phone]   Fax:  [phone] ? ?Physical Therapy Treatment ? ?Patient Details  ?Name: Carrie Singh ?MRN: [id number] ?Date of Birth: 1955-09-29 ?Referring Provider (PT): Thedore Mins ? ? ?Encounter Date: 04/27/2021 ? ? PT End of Session - 04/27/21 1208   ? ? Visit Number 6   ? Number of Visits 14   ? Date for PT Re-Evaluation 05/19/21   ? PT Start Time 1205   ? PT Stop Time 9622   ? PT Time Calculation (min) 59 min   ? Activity Tolerance Patient tolerated treatment well   ? Behavior During Therapy [facility] for tasks assessed/performed   ? ?  ?  ? ?  ? ? ?Past Medical History:  ?Diagnosis Date  ? Breast cancer ([place]) 01/2021  ? left breast IDC  ? Breast cancer, right breast ([place]) 12/2007  ? a. s/p chemo, radiation, and lumpectomy with negative sentinel lymph node biopsy   ? Complication of anesthesia   ? woke up during colonoscopy  ? Depression   ? Diabetes mellitus without complication ([place])   ? GERD (gastroesophageal reflux disease)   ? Hyperlipidemia   ? Hypertension   ? resistant, negative renal Dopplers and negative CT angiogram  ? Migraines   ? Obesity   ? ? ?Past Surgical History:  ?Procedure Laterality Date  ? ABDOMINAL HYSTERECTOMY    ? BREAST LUMPECTOMY    ? CYST EXCISION    ? FOOT SURGERY    ? MASTECTOMY Right   ? MASTECTOMY W/ SENTINEL NODE BIOPSY Right 03/18/2016  ? Procedure: RIGHT MASTECTOMY WITH RIGHT AXILLARY SENTINEL LYMPH NODE BIOPSY;  Surgeon: Alphonsa Overall, MD;  Location: [place];  Service: General;  Laterality: Right;  ? MASTECTOMY W/ SENTINEL NODE BIOPSY Left 03/10/2021  ? Procedure: LEFT MASTECTOMY WITH LEFT SENTINEL LYMPH NODE BIOPSY;  Surgeon: Erroll Luna, MD;  Location: [facility];  Service: General;  Laterality: Left;  ? ovary tumor    ? vocal cord nodules    ? ? ?There were no vitals filed for this visit. ? ? Subjective Assessment - 04/27/21 1210   ? ?  Subjective I have been feeling much better since I started physical therapy.   ? Pertinent History She has a history of right breast cancer treated initially back in 2009 with breast conserving surgery. She had additional cancer in 2018 and right breast underwent simple mastectomy. She had symptom mapping both times and radiation with her lumpectomy. 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 She had a left mastectomy on 03/10/2021. It was ER =, PR0, HER 2 - with a Ki 74 of 15% Pt has a hx of renal disease, DM, shingles. She is on disability. Right shoulder ROM has been limited since prior surgeries.  she never had rehab.   ? Patient Stated Goals post surgical reassessment   ? Currently in Pain? No/denies   ? ?  ?  ? ?  ? ? ? ? ? ? ? ? ? ? ? ? ? ? ? ? ? ? ? ? [place] Adult PT Treatment/Exercise - 04/27/21 0001   ? ?  ? Shoulder Exercises: Supine  ? Horizontal ABduction Strengthening;Both;10 reps;Theraband   ? Theraband Level (Shoulder Horizontal ABduction) Level 1 (Yellow)   ?  External Rotation Strengthening;Both;5 reps;Theraband   ? Theraband Level (Shoulder External Rotation) Level 1 (Yellow)   ? Flexion Strengthening;Both;5 reps;Theraband   Narrow and Wide Grip, 5x each  ? Theraband Level (Shoulder Flexion) Level 1 (Yellow)   ? Diagonals Strengthening;Right;Left;5 reps;Theraband   ? Theraband Level (Shoulder Diagonals) Level 1 (Yellow)   ?  ? Shoulder Exercises: Pulleys  ? Flexion 2 minutes   ? Flexion Limitations VCs to remind pt to relax shoulders   ? ABduction 2 minutes   ? ABduction Limitations VCs to remind pt to relax   ?  ? Shoulder Exercises: Therapy Ball  ? Flexion Both;10 reps   forward lean into end of stretch  ?  ? Manual Therapy  ? Soft tissue mobilization Soft tissue mobilization to left pecs, lats, UT with cocoa butter, and then into Rt S/L for Lt medial  scapula border   ? Myofascial Release MFR to cording in left upper arm, antecubital fossa and forearm   ? Passive ROM PROM to left shoulder flexion, abduction, D2 to pts tolerance   ? ?  ?  ? ?  ? ? ? ? ? ? ? ? ? ? PT Education - 04/27/21 1232   ? ? Education Details Supine scapular series with yellow theraband   ? Person(s) Educated Patient   ? Methods Explanation;Demonstration;Handout   ? Comprehension Verbalized understanding;Returned demonstration;Need further instruction   ? ?  ?  ? ?  ? ? ? ? ? ? PT Long Term Goals - 04/20/21 0921   ? ?  ? PT LONG TERM GOAL #1  ? Title Pt will restore left shoulder ROM to within 5-10 degrees of pre-op levels for functional use of left UE   ? Time 6   ? Period Weeks   ? Status Achieved   ? Target Date 04/20/21   ?  ? PT LONG TERM GOAL #2  ? Title Pt will have decreased axillary and arm pain from cording by 50% or more   ? Baseline 70%   ? Time 6   ? Period Weeks   ? Status Achieved   ? Target Date 04/20/21   ?  ? PT LONG TERM GOAL #3  ? Title Pt will attend ABC classs   ? Time 4   ? Period Weeks   ? Status On-going   ? Target Date 05/19/21   ?  ? PT LONG TERM GOAL #4  ? Title Pt will be fit for prophylactic compression sleeve/gauntlet   ? Time 6   ? Period Weeks   ? Status On-going   ? Target Date 05/19/21   ?  ? PT LONG TERM GOAL #5  ? Title Pt willhave atleast 130 degrees of left shoulder flexion and abduction for improved ability to perform home activities   ? Time 6   ? Period Weeks   ? Status Achieved   ? Target Date 04/20/21   ? ?  ?  ? ?  ? ? ? ? ? ? ? ? Plan - 04/27/21 1232   ? ? Clinical Impression Statement Pt reports feeling she is progressing very well with pain reduction and her cording is much improved. Progressed her HEP to include supine scapular series with yellow theraband. She was able to return good demo of each with min CVs for correct technique. Then continued with manual therapy working to decrease muscle tightness in Lt upper quadrant.   ? Personal  Factors and Comorbidities Comorbidity 3+   ?  Comorbidities Active CA, prior right breast surgeries/infection, DM, kidney disease   ? Stability/Clinical Decision Making Stable/Uncomplicated   ? Rehab Potential Good   ? PT Frequency 2x / week   ? PT Duration 6 weeks   ? PT Treatment/Interventions ADLs/Self Care Home Management;Therapeutic exercise;Patient/family education;Scar mobilization;Orthotic Fit/Training;Manual techniques;Passive range of motion;Manual lymph drainage;Compression bandaging   ? PT Next Visit Plan Measure pt for OTS compression sleeve, pt will pay OOP, Review supine scapular series prn, continue cording release, review theraband exercises and issue HEP, chest stretch   ? PT Home Exercise Plan 4 post op exercises; supine scapular series   ? Consulted and Agree with Plan of Care Patient   ? ?  ?  ? ?  ? ? ?Patient will benefit from skilled therapeutic intervention in order to improve the following deficits and impairments:  Decreased range of motion, Decreased knowledge of precautions, Postural dysfunction, Impaired UE functional use, Pain, Decreased scar mobility, Impaired sensation, Increased edema, Decreased mobility, Decreased strength ? ?Visit Diagnosis: ?Malignant neoplasm of lower-outer quadrant of right female breast, unspecified estrogen receptor status ([place]) ? ?Abnormal posture ? ?Stiffness of left shoulder, not elsewhere classified ? ?Pain in left arm ? ? ? ? ?Problem List ?Patient Active Problem List  ? Diagnosis Date Noted  ? Class 1 obesity due to excess calories with body mass index (BMI) of 34.0 to 34.9 in adult 08/21/2020  ? CKD (chronic kidney disease) stage 3, GFR 30-59 ml/min (HCC) 05/30/2019  ? GERD (gastroesophageal reflux disease) 11/30/2017  ? Intractable episodic cluster headache 12/04/2014  ? Depression with somatization 12/04/2014  ? HTN (hypertension) 04/24/2013  ? HLD (hyperlipidemia) 04/24/2013  ? DM2 (diabetes mellitus, type 2) ([place]) 04/24/2013  ? Malignant neoplasm of  lower-outer quadrant of right breast of female, estrogen receptor positive ([place]) 12/18/2007  ? ? ?Otelia Limes, PTA ?04/27/2021, 1:11 PM ? ?[place] ?[facility]

## 2021-04-27 NOTE — Patient Instructions (Signed)

## 2021-04-30 ENCOUNTER — Encounter: Payer: Self-pay | Admitting: Physical Therapy

## 2021-04-30 ENCOUNTER — Ambulatory Visit: Payer: Medicare Other | Admitting: Physical Therapy

## 2021-04-30 DIAGNOSIS — R293 Abnormal posture: Secondary | ICD-10-CM | POA: Diagnosis not present

## 2021-04-30 DIAGNOSIS — M79602 Pain in left arm: Secondary | ICD-10-CM | POA: Diagnosis not present

## 2021-04-30 DIAGNOSIS — M25612 Stiffness of left shoulder, not elsewhere classified: Secondary | ICD-10-CM

## 2021-04-30 DIAGNOSIS — C50511 Malignant neoplasm of lower-outer quadrant of right female breast: Secondary | ICD-10-CM

## 2021-04-30 NOTE — Therapy (Signed)
[place] ?[facility] ?[place], [place], [address] ?Phone: [phone]   Fax:  [phone] ? ?Physical Therapy Treatment ? ?Patient Details  ?Name: Carrie Singh ?MRN: [id number] ?Date of Birth: 10-22-1955 ?Referring Provider (PT): Thedore Mins ? ? ?Encounter Date: 04/30/2021 ? ? PT End of Session - 04/30/21 0953   ? ? Visit Number 7   ? Number of Visits 14   ? Date for PT Re-Evaluation 05/19/21   ? PT Start Time [phone]   ? PT Stop Time [phone]   ? PT Time Calculation (min) 48 min   ? Activity Tolerance Patient tolerated treatment well   ? Behavior During Therapy [facility] for tasks assessed/performed   ? ?  ?  ? ?  ? ? ?Past Medical History:  ?Diagnosis Date  ? Breast cancer ([place]) 01/2021  ? left breast IDC  ? Breast cancer, right breast ([place]) 12/2007  ? a. s/p chemo, radiation, and lumpectomy with negative sentinel lymph node biopsy   ? Complication of anesthesia   ? woke up during colonoscopy  ? Depression   ? Diabetes mellitus without complication ([place])   ? GERD (gastroesophageal reflux disease)   ? Hyperlipidemia   ? Hypertension   ? resistant, negative renal Dopplers and negative CT angiogram  ? Migraines   ? Obesity   ? ? ?Past Surgical History:  ?Procedure Laterality Date  ? ABDOMINAL HYSTERECTOMY    ? BREAST LUMPECTOMY    ? CYST EXCISION    ? FOOT SURGERY    ? MASTECTOMY Right   ? MASTECTOMY W/ SENTINEL NODE BIOPSY Right 03/18/2016  ? Procedure: RIGHT MASTECTOMY WITH RIGHT AXILLARY SENTINEL LYMPH NODE BIOPSY;  Surgeon: Alphonsa Overall, MD;  Location: [place];  Service: General;  Laterality: Right;  ? MASTECTOMY W/ SENTINEL NODE BIOPSY Left 03/10/2021  ? Procedure: LEFT MASTECTOMY WITH LEFT SENTINEL LYMPH NODE BIOPSY;  Surgeon: Erroll Luna, MD;  Location: [facility];  Service: General;  Laterality: Left;  ? ovary tumor    ? vocal cord nodules    ? ? ?There were no vitals filed for this visit. ? ? Subjective Assessment - 04/30/21 1002   ? ?  Subjective The cording is slowly getting better but my shoulder is still very tight.   ? Pertinent History She has a history of right breast cancer treated initially back in 2009 with breast conserving surgery. She had additional cancer in 2018 and right breast underwent simple mastectomy. She had symptom mapping both times and radiation with her lumpectomy. 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 She had a left mastectomy on 03/10/2021. It was ER =, PR0, HER 2 - with a Ki 79 of 15% Pt has a hx of renal disease, DM, shingles. She is on disability. Right shoulder ROM has been limited since prior surgeries.  she never had rehab.   ? Patient Stated Goals post surgical reassessment   ? Currently in Pain? No/denies   ? Pain Score 0-No pain   ? ?  ?  ? ?  ? ? ? ? ? ? ? ? ? ? ? ? ? ? ? ? ? ? ? ? [place] Adult PT Treatment/Exercise - 04/30/21 0001   ? ?  ? Shoulder Exercises: Pulleys  ? Flexion 2 minutes   ?  ABduction 2 minutes   ?  ? Shoulder Exercises: Therapy Ball  ? Flexion Both;10 reps   forward lean into end of stretch  ? ABduction Left;10 reps   ?  ? Manual Therapy  ? Soft tissue mobilization Soft tissue mobilization to left pecs, lats, UT with cocoa butter, and then into Rt S/L for Lt medial scapula border   ? Myofascial Release MFR to cording in left upper arm, antecubital fossa and forearm   ? Passive ROM PROM to left shoulder flexion, abduction, to pts tolerance   ? ?  ?  ? ?  ? ? ? ? ? ? ? ? ? ? ? ? ? ? ? PT Long Term Goals - 04/20/21 0921   ? ?  ? PT LONG TERM GOAL #1  ? Title Pt will restore left shoulder ROM to within 5-10 degrees of pre-op levels for functional use of left UE   ? Time 6   ? Period Weeks   ? Status Achieved   ? Target Date 04/20/21   ?  ? PT LONG TERM GOAL #2  ? Title Pt will have decreased axillary and arm pain from cording by 50% or more   ?  Baseline 70%   ? Time 6   ? Period Weeks   ? Status Achieved   ? Target Date 04/20/21   ?  ? PT LONG TERM GOAL #3  ? Title Pt will attend ABC classs   ? Time 4   ? Period Weeks   ? Status On-going   ? Target Date 05/19/21   ?  ? PT LONG TERM GOAL #4  ? Title Pt will be fit for prophylactic compression sleeve/gauntlet   ? Time 6   ? Period Weeks   ? Status On-going   ? Target Date 05/19/21   ?  ? PT LONG TERM GOAL #5  ? Title Pt willhave atleast 130 degrees of left shoulder flexion and abduction for improved ability to perform home activities   ? Time 6   ? Period Weeks   ? Status Achieved   ? Target Date 04/20/21   ? ?  ?  ? ?  ? ? ? ? ? ? ? ? Plan - 04/30/21 1002   ? ? Clinical Impression Statement Pt still has signficant cording extending from axilla to forearm that causes increased discomfort. She tolerates manual therapy to this area very well. She reports she has been doing her supine scapular series at home. Continued with focus today on manual therapy to decrease cording and decrease tightness to allow for improved ROM and STM to L pec and L scapular musculature to decrease tightness and discomfort.   ? PT Frequency 2x / week   ? PT Duration 6 weeks   ? PT Treatment/Interventions ADLs/Self Care Home Management;Therapeutic exercise;Patient/family education;Scar mobilization;Orthotic Fit/Training;Manual techniques;Passive range of motion;Manual lymph drainage;Compression bandaging   ? PT Next Visit Plan Measure pt for OTS compression sleeve, pt will pay OOP, Review supine scapular series prn, continue cording release, review theraband exercises and issue HEP, chest stretch   ? PT Home Exercise Plan 4 post op exercises; supine scapular series   ? Consulted and Agree with Plan of Care Patient   ? ?  ?  ? ?  ? ? ?Patient will benefit from skilled therapeutic intervention in order to improve the following deficits and impairments:  Decreased range of motion, Decreased knowledge of precautions, Postural dysfunction,  Impaired UE functional use, Pain,  Decreased scar mobility, Impaired sensation, Increased edema, Decreased mobility, Decreased strength ? ?Visit Diagnosis: ?Pain in left arm ? ?Stiffness of left shoulder, not elsewhere classified ? ?Abnormal posture ? ?Malignant neoplasm of lower-outer quadrant of right female breast, unspecified estrogen receptor status ([place]) ? ? ? ? ?Problem List ?Patient Active Problem List  ? Diagnosis Date Noted  ? Class 1 obesity due to excess calories with body mass index (BMI) of 34.0 to 34.9 in adult 08/21/2020  ? CKD (chronic kidney disease) stage 3, GFR 30-59 ml/min (HCC) 05/30/2019  ? GERD (gastroesophageal reflux disease) 11/30/2017  ? Intractable episodic cluster headache 12/04/2014  ? Depression with somatization 12/04/2014  ? HTN (hypertension) 04/24/2013  ? HLD (hyperlipidemia) 04/24/2013  ? DM2 (diabetes mellitus, type 2) ([place]) 04/24/2013  ? Malignant neoplasm of lower-outer quadrant of right breast of female, estrogen receptor positive ([place]) 12/18/2007  ? ? ?Allyson Sabal [place], PT ?04/30/2021, 10:04 AM ? ?[place] ?[facility] ?[place], [place], [address] ?Phone: [phone]   Fax:  [phone] ? ?Name: Carrie Singh ?MRN: [id number] ?Date of Birth: 22-May-1955 ? ? ? ?

## 2021-05-03 ENCOUNTER — Ambulatory Visit: Payer: Medicare Other

## 2021-05-03 DIAGNOSIS — M25612 Stiffness of left shoulder, not elsewhere classified: Secondary | ICD-10-CM | POA: Diagnosis not present

## 2021-05-03 DIAGNOSIS — R293 Abnormal posture: Secondary | ICD-10-CM | POA: Diagnosis not present

## 2021-05-03 DIAGNOSIS — M79602 Pain in left arm: Secondary | ICD-10-CM | POA: Diagnosis not present

## 2021-05-03 DIAGNOSIS — C50511 Malignant neoplasm of lower-outer quadrant of right female breast: Secondary | ICD-10-CM | POA: Diagnosis not present

## 2021-05-03 NOTE — Therapy (Signed)
[place] ?[facility] ?[place], [place], [address] ?Phone: [phone]   Fax:  [phone] ? ?Physical Therapy Treatment ? ?Patient Details  ?Name: Carrie Singh ?MRN: [id number] ?Date of Birth: December 23, 1955 ?Referring Provider (PT): Thedore Mins ? ? ?Encounter Date: 05/03/2021 ? ? PT End of Session - 05/03/21 0916   ? ? Visit Number 8   ? Number of Visits 14   ? Date for PT Re-Evaluation 05/19/21   ? PT Start Time [phone]   pt arrived late  ? PT Stop Time 1001   ? PT Time Calculation (min) 49 min   ? Activity Tolerance Patient tolerated treatment well   ? Behavior During Therapy [facility] for tasks assessed/performed   ? ?  ?  ? ?  ? ? ?Past Medical History:  ?Diagnosis Date  ? Breast cancer ([place]) 01/2021  ? left breast IDC  ? Breast cancer, right breast ([place]) 12/2007  ? a. s/p chemo, radiation, and lumpectomy with negative sentinel lymph node biopsy   ? Complication of anesthesia   ? woke up during colonoscopy  ? Depression   ? Diabetes mellitus without complication ([place])   ? GERD (gastroesophageal reflux disease)   ? Hyperlipidemia   ? Hypertension   ? resistant, negative renal Dopplers and negative CT angiogram  ? Migraines   ? Obesity   ? ? ?Past Surgical History:  ?Procedure Laterality Date  ? ABDOMINAL HYSTERECTOMY    ? BREAST LUMPECTOMY    ? CYST EXCISION    ? FOOT SURGERY    ? MASTECTOMY Right   ? MASTECTOMY W/ SENTINEL NODE BIOPSY Right 03/18/2016  ? Procedure: RIGHT MASTECTOMY WITH RIGHT AXILLARY SENTINEL LYMPH NODE BIOPSY;  Surgeon: Alphonsa Overall, MD;  Location: [place];  Service: General;  Laterality: Right;  ? MASTECTOMY W/ SENTINEL NODE BIOPSY Left 03/10/2021  ? Procedure: LEFT MASTECTOMY WITH LEFT SENTINEL LYMPH NODE BIOPSY;  Surgeon: Erroll Luna, MD;  Location: [facility];  Service: General;  Laterality: Left;  ? ovary tumor    ? vocal cord nodules    ? ? ?There were no vitals filed for this visit. ? ? Subjective Assessment - 05/03/21  0916   ? ? Subjective I can feel some of the cord pulling down to my wrist today. I've been wearing the TG soft sleeve bc it seems to help. I sleep in it too.   ? Pertinent History She has a history of right breast cancer treated initially back in 2009 with breast conserving surgery. She had additional cancer in 2018 and right breast underwent simple mastectomy. She had symptom mapping both times and radiation with her lumpectomy. 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 She had a left mastectomy on 03/10/2021. It was ER =, PR0, HER 2 - with a Ki 91 of 15% Pt has a hx of renal disease, DM, shingles. She is on disability. Right shoulder ROM has been limited since prior surgeries.  she never had rehab.   ? ?  ?  ? ?  ? ? ? ? ? ? ? ? ? ? ? ? ? ? ? ? ? ? ? ? [place] [facility] Adult PT Treatment/Exercise - 05/03/21 0001   ? ?  ? Shoulder Exercises: Pulleys  ? Flexion 2 minutes   ? Flexion Limitations  Tactile cues to decrease Lt scapular compensation   ? ABduction 2 minutes   ? ABduction Limitations Tactile and VCs to decrease Lt scapular compensation, pt able to return good demo after cuing   ?  ? Shoulder Exercises: Therapy Ball  ? Flexion Both;10 reps   forwar lean into end of stretch  ? Flexion Limitations Brief VCs to decr Lt scapular compensation   ? ABduction Left;5 reps   ? ABduction Limitations Demo and VCs to decr Lt scapular compensation   ?  ? Manual Therapy  ? Soft tissue mobilization Soft tissue mobilization to left pecs, lats, UT with cocoa butter, and then into Rt S/L for Lt medial scapula border   ? Myofascial Release MFR to cording in left upper arm, antecubital fossa and forearm   ? Passive ROM PROM to left shoulder flexion, abduction, and D2 to pts tolerance   ? ?  ?  ? ?  ? ? ? ? ? ? ? ? ? ? ? ? ? ? ? PT Long Term Goals - 04/20/21 0921   ? ?  ? PT LONG TERM GOAL #1  ?  Title Pt will restore left shoulder ROM to within 5-10 degrees of pre-op levels for functional use of left UE   ? Time 6   ? Period Weeks   ? Status Achieved   ? Target Date 04/20/21   ?  ? PT LONG TERM GOAL #2  ? Title Pt will have decreased axillary and arm pain from cording by 50% or more   ? Baseline 70%   ? Time 6   ? Period Weeks   ? Status Achieved   ? Target Date 04/20/21   ?  ? PT LONG TERM GOAL #3  ? Title Pt will attend ABC classs   ? Time 4   ? Period Weeks   ? Status On-going   ? Target Date 05/19/21   ?  ? PT LONG TERM GOAL #4  ? Title Pt will be fit for prophylactic compression sleeve/gauntlet   ? Time 6   ? Period Weeks   ? Status On-going   ? Target Date 05/19/21   ?  ? PT LONG TERM GOAL #5  ? Title Pt willhave atleast 130 degrees of left shoulder flexion and abduction for improved ability to perform home activities   ? Time 6   ? Period Weeks   ? Status Achieved   ? Target Date 04/20/21   ? ?  ?  ? ?  ? ? ? ? ? ? ? ? Plan - 05/03/21 0924   ? ? Clinical Impression Statement Pt arrives late to appt today. She repotrs feeling the cording pull down to her wrist today and still feeling tight at her chest but cording at her elbow is feeling better. So cont to focus on manual therapy at her Lt upper quadrant working to decrease fascial restrictions. Pts end P/ROM was improved by end of session and she reports feeling looser as well.   ? Personal Factors and Comorbidities Comorbidity 3+   ? Comorbidities Active CA, prior right breast surgeries/infection, DM, kidney disease   ? Stability/Clinical Decision Making Stable/Uncomplicated   ? Rehab Potential Good   ? PT Frequency 2x / week   ? PT Duration 6 weeks   ? PT Treatment/Interventions ADLs/Self Care Home Management;Therapeutic exercise;Patient/family education;Scar mobilization;Orthotic Fit/Training;Manual techniques;Passive range of motion;Manual lymph drainage;Compression bandaging   ? PT Next Visit Plan Measure pt for OTS compression  sleeve, pt will  pay OOP, Review supine scapular series prn, continue cording release, review theraband exercises and issue HEP, chest stretch   ? PT Home Exercise Plan 4 post op exercises; supine scapular series   ? Consulted and Agree with Plan of Care Patient   ? ?  ?  ? ?  ? ? ?Patient will benefit from skilled therapeutic intervention in order to improve the following deficits and impairments:  Decreased range of motion, Decreased knowledge of precautions, Postural dysfunction, Impaired UE functional use, Pain, Decreased scar mobility, Impaired sensation, Increased edema, Decreased mobility, Decreased strength ? ?Visit Diagnosis: ?Pain in left arm ? ?Stiffness of left shoulder, not elsewhere classified ? ?Abnormal posture ? ?Malignant neoplasm of lower-outer quadrant of right female breast, unspecified estrogen receptor status ([place]) ? ? ? ? ?Problem List ?Patient Active Problem List  ? Diagnosis Date Noted  ? Class 1 obesity due to excess calories with body mass index (BMI) of 34.0 to 34.9 in adult 08/21/2020  ? CKD (chronic kidney disease) stage 3, GFR 30-59 ml/min (HCC) 05/30/2019  ? GERD (gastroesophageal reflux disease) 11/30/2017  ? Intractable episodic cluster headache 12/04/2014  ? Depression with somatization 12/04/2014  ? HTN (hypertension) 04/24/2013  ? HLD (hyperlipidemia) 04/24/2013  ? DM2 (diabetes mellitus, type 2) ([place]) 04/24/2013  ? Malignant neoplasm of lower-outer quadrant of right breast of female, estrogen receptor positive ([place]) 12/18/2007  ? ? ?Otelia Limes, PTA ?05/03/2021, 10:02 AM ? ?[place] ?[facility] ?[place], [place], [address] ?Phone: [phone]   Fax:  [phone] ? ?Name: Carrie Singh ?MRN: [id number] ?Date of Birth: 1955/07/25 ? ? ? ?

## 2021-05-07 ENCOUNTER — Ambulatory Visit: Payer: Medicare Other

## 2021-05-07 DIAGNOSIS — R293 Abnormal posture: Secondary | ICD-10-CM | POA: Diagnosis not present

## 2021-05-07 DIAGNOSIS — M25612 Stiffness of left shoulder, not elsewhere classified: Secondary | ICD-10-CM

## 2021-05-07 DIAGNOSIS — M79602 Pain in left arm: Secondary | ICD-10-CM

## 2021-05-07 DIAGNOSIS — C50511 Malignant neoplasm of lower-outer quadrant of right female breast: Secondary | ICD-10-CM | POA: Diagnosis not present

## 2021-05-07 NOTE — Therapy (Signed)
[facility] ?[facility] ?[place], [place], [address] ?Phone: [phone]   Fax:  [phone] ? ?Physical Therapy Treatment ? ?Patient Details  ?Name: Carrie Singh ?MRN: [id number] ?Date of Birth: 1955-11-09 ?Referring Provider (PT): Thedore Mins ? ? ?Encounter Date: 05/07/2021 ? ? PT End of Session - 05/07/21 0910   ? ? Visit Number 9   ? Number of Visits 14   ? Date for PT Re-Evaluation 05/19/21   ? PT Start Time [phone]   ? PT Stop Time 1003   ? PT Time Calculation (min) 58 min   ? Activity Tolerance Patient tolerated treatment well   ? Behavior During Therapy [facility] for tasks assessed/performed   ? ?  ?  ? ?  ? ? ?Past Medical History:  ?Diagnosis Date  ? Breast cancer ([place]) 01/2021  ? left breast IDC  ? Breast cancer, right breast ([place]) 12/2007  ? a. s/p chemo, radiation, and lumpectomy with negative sentinel lymph node biopsy   ? Complication of anesthesia   ? woke up during colonoscopy  ? Depression   ? Diabetes mellitus without complication ([place])   ? GERD (gastroesophageal reflux disease)   ? Hyperlipidemia   ? Hypertension   ? resistant, negative renal Dopplers and negative CT angiogram  ? Migraines   ? Obesity   ? ? ?Past Surgical History:  ?Procedure Laterality Date  ? ABDOMINAL HYSTERECTOMY    ? BREAST LUMPECTOMY    ? CYST EXCISION    ? FOOT SURGERY    ? MASTECTOMY Right   ? MASTECTOMY W/ SENTINEL NODE BIOPSY Right 03/18/2016  ? Procedure: RIGHT MASTECTOMY WITH RIGHT AXILLARY SENTINEL LYMPH NODE BIOPSY;  Surgeon: Alphonsa Overall, MD;  Location: [facility];  Service: General;  Laterality: Right;  ? MASTECTOMY W/ SENTINEL NODE BIOPSY Left 03/10/2021  ? Procedure: LEFT MASTECTOMY WITH LEFT SENTINEL LYMPH NODE BIOPSY;  Surgeon: Erroll Luna, MD;  Location: [facility];  Service: General;  Laterality: Left;  ? ovary tumor    ? vocal cord nodules    ? ? ?There were no vitals filed for this visit. ? ? Subjective Assessment - 05/07/21 0924   ? ?  Subjective I still have the tightness around the front and back of my Lt shoulder when I raise my arm up.   ? Pertinent History She has a history of right breast cancer treated initially back in 2009 with breast conserving surgery. She had additional cancer in 2018 and right breast underwent simple mastectomy. She had symptom mapping both times and radiation with her lumpectomy. 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 She had a left mastectomy on 03/10/2021. It was ER =, PR0, HER 2 - with a Ki 53 of 15% Pt has a hx of renal disease, DM, shingles. She is on disability. Right shoulder ROM has been limited since prior surgeries.  she never had rehab.   ? Patient Stated Goals post surgical reassessment   ? Currently in Pain? No/denies   ? ?  ?  ? ?  ? ? ? ? ? ? ? ? ? ? ? ? ? ? ? ? ? ? ? ? [facility] Adult PT Treatment/Exercise - 05/07/21 0001   ? ?  ? Self-Care  ? Self-Care Other Self-Care Comments   ? Other  Self-Care Comments  Answered pts questions from ABC class and about cording.   ?  ? Exercises  ? Exercises Other Exercises   ? Other Exercises  FreeMotion Machine 10# for scapular retraction returning therapist demo and tactile cues during for correct UE position   ?  ? Shoulder Exercises: Pulleys  ? Flexion 3 minutes   ? ABduction 2 minutes   ? ABduction Limitations Pt with good self correcting technique today for each   ?  ? Shoulder Exercises: Therapy Ball  ? Flexion Both;10 reps   forward lean into end of stretch  ? Flexion Limitations Pt with good self correcting technique of decreasing scapular compensation   ? ABduction Left;10 reps   ?  ? Manual Therapy  ? Soft tissue mobilization Started seated edge of bed with cocoa butter to Lt ant/post shoulder; then in Supine Soft tissue mobilization to left pecs, UT   ? Myofascial Release --   ? Passive ROM PROM to left shoulder  flexion, abduction, and D2 to pts tolerance   ? ?  ?  ? ?  ? ? ? ? ? ? ? ? ? ? ? ? ? ? ? PT Long Term Goals - 04/20/21 0921   ? ?  ? PT LONG TERM GOAL #1  ? Title Pt will restore left shoulder ROM to within 5-10 degrees of pre-op levels for functional use of left UE   ? Time 6   ? Period Weeks   ? Status Achieved   ? Target Date 04/20/21   ?  ? PT LONG TERM GOAL #2  ? Title Pt will have decreased axillary and arm pain from cording by 50% or more   ? Baseline 70%   ? Time 6   ? Period Weeks   ? Status Achieved   ? Target Date 04/20/21   ?  ? PT LONG TERM GOAL #3  ? Title Pt will attend ABC classs   ? Time 4   ? Period Weeks   ? Status On-going   ? Target Date 05/19/21   ?  ? PT LONG TERM GOAL #4  ? Title Pt will be fit for prophylactic compression sleeve/gauntlet   ? Time 6   ? Period Weeks   ? Status On-going   ? Target Date 05/19/21   ?  ? PT LONG TERM GOAL #5  ? Title Pt willhave atleast 130 degrees of left shoulder flexion and abduction for improved ability to perform home activities   ? Time 6   ? Period Weeks   ? Status Achieved   ? Target Date 04/20/21   ? ?  ?  ? ?  ? ? ? ? ? ? ? ? Plan - 05/07/21 0954   ? ? Clinical Impression Statement Continued with AA/ROM with pulleys and ball roll up wall. Pt is able to better demonstrate now self correcting of Lt scapular compensation showing good improvement with awarenss of her posture with activities. Added scap retraction with free motion machine which she repotrs was chellenged by but tolerated well. Spent some of session answering her questions from the ABC class about the lymphatic system and cording. Then continued with STM to Lt upper quadrant and P/ROM of Lt shoulder. Overall pt reports though she still has pain with OH reaching and reaching behind her her general pain and ROM has improved.   ? Personal Factors and Comorbidities Comorbidity 3+   ? Comorbidities Active CA, prior right breast surgeries/infection, DM,  kidney disease   ? Stability/Clinical  Decision Making Stable/Uncomplicated   ? Rehab Potential Good   ? PT Frequency 2x / week   ? PT Duration 6 weeks   ? PT Treatment/Interventions ADLs/Self Care Home Management;Therapeutic exercise;Patient/family education;Scar mobilization;Orthotic Fit/Training;Manual techniques;Passive range of motion;Manual lymph drainage;Compression bandaging   ? PT Next Visit Plan Measure pt for OTS compression sleeve, pt will pay OOP, Review supine scapular series prn, continue cording release, review theraband exercises and issue HEP, chest stretch   ? PT Home Exercise Plan 4 post op exercises; supine scapular series   ? Consulted and Agree with Plan of Care Patient   ? ?  ?  ? ?  ? ? ?Patient will benefit from skilled therapeutic intervention in order to improve the following deficits and impairments:  Decreased range of motion, Decreased knowledge of precautions, Postural dysfunction, Impaired UE functional use, Pain, Decreased scar mobility, Impaired sensation, Increased edema, Decreased mobility, Decreased strength ? ?Visit Diagnosis: ?Pain in left arm ? ?Stiffness of left shoulder, not elsewhere classified ? ?Abnormal posture ? ?Malignant neoplasm of lower-outer quadrant of right female breast, unspecified estrogen receptor status ([place]) ? ? ? ? ?Problem List ?Patient Active Problem List  ? Diagnosis Date Noted  ? Class 1 obesity due to excess calories with body mass index (BMI) of 34.0 to 34.9 in adult 08/21/2020  ? CKD (chronic kidney disease) stage 3, GFR 30-59 ml/min (HCC) 05/30/2019  ? GERD (gastroesophageal reflux disease) 11/30/2017  ? Intractable episodic cluster headache 12/04/2014  ? Depression with somatization 12/04/2014  ? HTN (hypertension) 04/24/2013  ? HLD (hyperlipidemia) 04/24/2013  ? DM2 (diabetes mellitus, type 2) ([place]) 04/24/2013  ? Malignant neoplasm of lower-outer quadrant of right breast of female, estrogen receptor positive ([place]) 12/18/2007  ? ? ?Otelia Limes, PTA ?05/07/2021, 10:08  AM ? ?[facility] ?[facility] ?[place], [place], [address] ?Phone: [phone]   Fax:  [phone] ? ?Name: Carrie Singh ?MRN: [id number] ?Date of Birth: [birth year]

## 2021-05-10 ENCOUNTER — Ambulatory Visit: Payer: Medicare Other

## 2021-05-10 ENCOUNTER — Ambulatory Visit: Payer: Medicare Other | Admitting: Pharmacist

## 2021-05-10 DIAGNOSIS — R293 Abnormal posture: Secondary | ICD-10-CM

## 2021-05-10 DIAGNOSIS — M79602 Pain in left arm: Secondary | ICD-10-CM

## 2021-05-10 DIAGNOSIS — M25612 Stiffness of left shoulder, not elsewhere classified: Secondary | ICD-10-CM | POA: Diagnosis not present

## 2021-05-10 DIAGNOSIS — I10 Essential (primary) hypertension: Secondary | ICD-10-CM

## 2021-05-10 DIAGNOSIS — C50511 Malignant neoplasm of lower-outer quadrant of right female breast: Secondary | ICD-10-CM | POA: Diagnosis not present

## 2021-05-10 NOTE — Progress Notes (Signed)
?Chronic Care Management  ? ?Outreach Note ? ?05/10/2021 ?Name: Carrie Singh MRN: [id number] DOB: February 16, 1955 ? ?I connected with Carrie Singh on 05/10/21 by telephone outreach and verified that I am speaking with the correct person using two identifiers. ? ?Patient appearing on report for [place] Metric Hypertension Control due to documented ambulatory blood pressure of 187/86 on 04/16/2021. Next appointment with PCP is not currently scheduled. ? ?Outreached patient to discuss hypertension control and medication management.  ? ?Outpatient Encounter Medications as of 05/10/2021  ?Medication Sig Note  ? amLODipine (NORVASC) 2.5 MG tablet Take by mouth.   ? carvedilol (COREG) 12.5 MG tablet Take 12.5 mg by mouth in the morning and at bedtime.   ? hydrALAZINE (APRESOLINE) 100 MG tablet TAKE 1 TABLET BY MOUTH 3  TIMES DAILY   ? irbesartan (AVAPRO) 300 MG tablet TAKE 1 TABLET BY MOUTH  DAILY   ? Biotin w/ Vitamins C & E (HAIR SKIN & NAILS GUMMIES PO) Take by mouth.   ? calcium carbonate (OSCAL) 1500 (600 Ca) MG TABS tablet Take by mouth 2 (two) times daily with a meal.   ? cholecalciferol (VITAMIN D3) 25 MCG (1000 UNIT) tablet Take 2,000 Units by mouth daily.   ? Lancets (ONETOUCH DELICA PLUS IOMBTD97C) MISC CHECK BLOOD SUGAR TWICE  DAILY   ? letrozole (FEMARA) 2.5 MG tablet Take 1 tablet (2.5 mg total) by mouth daily.   ? metFORMIN (GLUCOPHAGE) 1000 MG tablet Take 1,000 mg by mouth 2 (two) times daily with a meal.   ? Multiple Vitamins-Minerals (ONE-A-DAY WOMENS VITACRAVES) CHEW Chew 1 tablet by mouth daily.    ? ONETOUCH ULTRA test strip USE TWICE DAILY AS DIRECTED   ? pravastatin (PRAVACHOL) 40 MG tablet TAKE 1 TABLET BY MOUTH DAILY   ? repaglinide (PRANDIN) 0.5 MG tablet Take 0.5 mg by mouth 3 (three) times daily before meals.   ? SHINGRIX injection Inject 0.5 mL into muscle for shingles vaccine. Repeat dose in 2-6 months. (Patient not taking: Reported on 04/16/2021) 04/16/2021: Hasn't gotten injection yet   ? ?No  facility-administered encounter medications on file as of 05/10/2021.  ? ? ?Lab Results  ?Component Value Date  ? CREATININE 1.07 (H) 03/05/2021  ? BUN 14 03/05/2021  ? NA 138 03/05/2021  ? K 3.6 03/05/2021  ? CL 104 03/05/2021  ? CO2 26 03/05/2021  ? ? ?BP Readings from Last 3 Encounters:  ?04/16/21 (!) 187/86  ?04/12/21 130/82  ?03/18/21 (!) 155/84  ? ? ?Pulse Readings from Last 3 Encounters:  ?04/16/21 84  ?04/12/21 77  ?03/18/21 81  ? ?Unable to complete comprehensive medication review today as patient reports not currently at home ? ?Current medications:  ?amlodipine 2.5 mg daily ?carvedilol 12.5 mg twice daily ?hydralazine 100 mg three times daily ?irbesartan 300 mg daily ? ?Reports uses weekly pillbox to organize her medications for three times/day dosing. ?Reports need to work on consistency with taking her medications. ?Encourage patient to continue using weekly pillbox and to also start using daily phone alarms to aid with adherence ? ?Home Monitoring: ?Patient has an automated upper arm home BP machine, but that cuff does not fit around her upper arm. ?Counsel on importance of having a cuff that fits her arm for readings to be accurate ?Encourage patient to take measurement of upper arm to use to find a upper arm monitor with cuff that will fit her. Encourage patient to check with health plan over the counter benefit (OTC) benefit to see  if a monitor with correct cuff size is available through this benefit ?Counsel on BP monitoring technique ?Current blood pressure readings: Denies monitoring recently ? ?Current physical activity: walking ~15-30 minutes/day x 3-5 days/week. Reports goal of 30 minutes/day ? ?Reports rarely drinks caffeine ? ?Denies adding salt to her food. Counsel on impact of salt/sodium on blood pressure control ? ?Assessment/Plan: ?- Currently uncontrolled ?- Reviewed goal blood pressure <130/80 ?- Reviewed appropriate administration of medication regimen ?- Counseled on long term  microvascular and macrovascular complications of uncontrolled hypertension ?- Reviewed appropriate home BP monitoring technique (avoid caffeine, smoking, and exercise for 30 minutes before checking, rest for at least 5 minutes before taking BP, sit with feet flat on the floor and back against a hard surface, uncross legs, and rest arm on flat surface) ?- Reviewed to check blood pressure, document, and provide at next provider visit ?- Discussed dietary modifications, such as reduced salt intake, focus on whole grains, vegetables, lean proteins ?- Discussed goal of 150 minutes of moderate intensity physical activity weekly ?- Reviewed strategies to improve medication adherence such as continuing to use weekly pillbox and using daily phone alarm ? ? ?Follow Up Plan: CM Pharmacist will outreach to patient by telephone again on 05/26/2021 at 10 am ? ?Wallace Cullens, PharmD, BCACP ?Clinical Pharmacist ?[facility] ?[place] ?[phone] ? ? ?

## 2021-05-10 NOTE — Therapy (Signed)
[place] [facility] [facility] ?[facility] ?[place], [place], [address] ?Phone: [phone]   Fax:  [phone] ? ?Physical Therapy Treatment ? ?Patient Details  ?Name: Carrie Singh ?MRN: [id number] ?Date of Birth: [birth year] ?Referring Provider (PT): Thedore Mins ? ? ?Encounter Date: 05/10/2021 ? ? PT End of Session - 05/10/21 0901   ? ? Visit Number 10   ? Number of Visits 14   ? Date for PT Re-Evaluation 05/19/21   ? PT Start Time 0902   ? PT Stop Time 0954   ? PT Time Calculation (min) 52 min   ? Activity Tolerance Patient tolerated treatment well   ? Behavior During Therapy [facility] for tasks assessed/performed   ? ?  ?  ? ?  ? ? ?Past Medical History:  ?Diagnosis Date  ? Breast cancer ([place]) 01/2021  ? left breast IDC  ? Breast cancer, right breast ([place]) 12/2007  ? a. s/p chemo, radiation, and lumpectomy with negative sentinel lymph node biopsy   ? Complication of anesthesia   ? woke up during colonoscopy  ? Depression   ? Diabetes mellitus without complication ([place])   ? GERD (gastroesophageal reflux disease)   ? Hyperlipidemia   ? Hypertension   ? resistant, negative renal Dopplers and negative CT angiogram  ? Migraines   ? Obesity   ? ? ?Past Surgical History:  ?Procedure Laterality Date  ? ABDOMINAL HYSTERECTOMY    ? BREAST LUMPECTOMY    ? CYST EXCISION    ? FOOT SURGERY    ? MASTECTOMY Right   ? MASTECTOMY W/ SENTINEL NODE BIOPSY Right 03/18/2016  ? Procedure: RIGHT MASTECTOMY WITH RIGHT AXILLARY SENTINEL LYMPH NODE BIOPSY;  Surgeon: Alphonsa Overall, MD;  Location: [facility];  Service: General;  Laterality: Right;  ? MASTECTOMY W/ SENTINEL NODE BIOPSY Left 03/10/2021  ? Procedure: LEFT MASTECTOMY WITH LEFT SENTINEL LYMPH NODE BIOPSY;  Surgeon: Erroll Luna, MD;  Location: [facility];  Service: General;  Laterality: Left;  ? ovary tumor    ? vocal cord nodules    ? ? ?There were no vitals filed for this visit. ? ? Subjective Assessment - 05/10/21 0901   ? ?  Subjective I still feel the cords but not as bad. My wrist is till very sore from the cord.   ? Pertinent History She has a history of right breast cancer treated initially back in 2009 with breast conserving surgery. She had additional cancer in 2018 and right breast underwent simple mastectomy. She had symptom mapping both times and radiation with her lumpectomy. 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 She had a left mastectomy on 03/10/2021. It was ER =, PR0, HER 2 - with a Ki 65 of 15% Pt has a hx of renal disease, DM, shingles. She is on disability. Right shoulder ROM has been limited since prior surgeries.  she never had rehab.   ? ?  ?  ? ?  ? ? ? ? ? [facility] PT Assessment - 05/10/21 0001   ? ?  ? AROM  ? Left Shoulder Flexion 167 Degrees   ? Left Shoulder ABduction 180 Degrees   ? ?  ?  ? ?  ? ? ? ? ? ? ? ? ? ? ? ? ? ? ? ? [facility] Adult PT  Treatment/Exercise - 05/10/21 0001   ? ?  ? Shoulder Exercises: Supine  ? Other Supine Exercises AROM bilateral flexion, scaption, and horizontal abduction   ?  ? Manual Therapy  ? Soft tissue mobilization Soft tissue mobilization to left pecs, lats, UT with cocoa butter, and then into Rt S/L for Lt medial scapula border   ? Myofascial Release MFR to cording in left forearm   ? Passive ROM PROM to left shoulder flexion, abduction, and D2 to pts tolerance   ? ?  ?  ? ?  ? ? ? ? ? ? ? ? ? ? ? ? ? ? ? PT Long Term Goals - 05/10/21 0944   ? ?  ? PT LONG TERM GOAL #1  ? Title Pt will restore left shoulder ROM to within 5-10 degrees of pre-op levels for functional use of left UE   ? Time 6   ? Period Weeks   ? Status Achieved   ? Target Date 04/20/21   ?  ? PT LONG TERM GOAL #2  ? Title Pt will have decreased axillary and arm pain from cording by 50% or more   ? Baseline 70%   ? Time 6   ? Status Achieved   ? Target Date 04/20/21   ?  ? PT  LONG TERM GOAL #3  ? Title Pt will attend ABC classs   ? Time 4   ? Status Achieved   ? Target Date 05/19/21   ?  ? PT LONG TERM GOAL #4  ? Title Pt will be fit for prophylactic compression sleeve/gauntlet   ? Time 6   ? Period Weeks   ? Status On-going   ? Target Date 05/19/21   ?  ? PT LONG TERM GOAL #5  ? Title Pt willhave atleast 130 degrees of left shoulder flexion and abduction for improved ability to perform home activities   ? Time 6   ? Period Weeks   ? Target Date 04/20/21   ? ?  ?  ? ?  ? ? ? ? ? ? ? ? Plan - 05/10/21 0916   ? ? Clinical Impression Statement Discussed [facility] and compression sleeves. Pt would like to have her garments billed to [facility] and will be measured on Wed. by Carrie Singh for flat knit sleeves and gauntlets. Continued Soft tissue mobilization, MFR and PROM. 1 large pop noted and a good sized cord released in forearm. several small cords still noted in forearm. Arm feels better since pop. Left pectorals and UT continue to be very tight. She demonstrated excellent shoulder ROM with AROM exercises and had no pain  ? Personal Factors and Comorbidities Comorbidity 3+   ? Comorbidities Active CA, prior right breast surgeries/infection, DM, kidney disease   ? Stability/Clinical Decision Making Stable/Uncomplicated   ? Rehab Potential Good   ? PT Frequency 2x / week   ? PT Duration 6 weeks   ? PT Treatment/Interventions ADLs/Self Care Home Management;Therapeutic exercise;Patient/family education;Scar mobilization;Orthotic Fit/Training;Manual techniques;Passive range of motion;Manual lymph drainage;Compression bandaging   ? PT Next Visit Plan Pt being fit on Wed and billed to [facility] for bilateral sleeves/gauntlets   ? PT Home Exercise Plan 4 post op exercises; supine scapular series   ? Consulted and Agree with Plan of Care Patient   ? ?  ?  ? ?  ? ? ?Patient will benefit from skilled therapeutic intervention in order to improve the following deficits and impairments:  Decreased range of motion,  Decreased knowledge of precautions, Postural dysfunction, Impaired UE functional use, Pain, Decreased scar mobility, Impaired sensation, Increased edema, Decreased mobility, Decreased strength ? ?Visit Diagnosis: ?Pain in left arm ? ?Stiffness of left shoulder, not elsewhere classified ? ?Abnormal posture ? ?Malignant neoplasm of lower-outer quadrant of right female breast, unspecified estrogen receptor status ([place]) ? ? ? ? ?Problem List ?Patient Active Problem List  ? Diagnosis Date Noted  ? Class 1 obesity due to excess calories with body mass index (BMI) of 34.0 to 34.9 in adult 08/21/2020  ? CKD (chronic kidney disease) stage 3, GFR 30-59 ml/min (HCC) 05/30/2019  ? GERD (gastroesophageal reflux disease) 11/30/2017  ? Intractable episodic cluster headache 12/04/2014  ? Depression with somatization 12/04/2014  ? HTN (hypertension) 04/24/2013  ? HLD (hyperlipidemia) 04/24/2013  ? DM2 (diabetes mellitus, type 2) ([place]) 04/24/2013  ? Malignant neoplasm of lower-outer quadrant of right breast of female, estrogen receptor positive ([place]) 12/18/2007  ? ? ?Carrie Singh, PT ?05/10/2021, 9:55 AM ? ?[facility] ?[facility] [place] ?[place], [place], [address] ?Phone: [phone]   Fax:  [phone] ? ?Name: Carrie Singh ?MRN: [id number] ?Date of Birth: [birth year] ? ? ? ?

## 2021-05-10 NOTE — Patient Instructions (Signed)
Goals Addressed   ? ?  ?  ?  ?  ? This Visit's Progress  ?  Pharmacy Goal     ?  Check your blood pressure. Our goal is less than 130/80. ? ?We recommend a blood pressure cuff that goes around your upper arm, as these are generally the most accurate.  ? ?To appropriately check your blood pressure, make sure you do the following:  ?1) Avoid caffeine, exercise, or tobacco products for 30 minutes before checking. ?2) Sit with your back supported in a flat-backed chair. Rest your arm on something flat (arm of the chair, table, etc). ?3) Sit still with your feet flat on the floor, resting, for at least 5 minutes.  ?4) Check your blood pressure. Take 1-2 readings.  ? ?Write down these readings and bring with you to any provider appointments. Bring your home blood pressure machine with you to a provider's office for accuracy comparison at least once a year. Make sure you take your blood pressure medications before you come to any office visit. ? ?It was a pleasure speaking with you! Our next telephone call is scheduled for 05/26/2021 at 10 am ? ?Wallace Cullens, PharmD, BCACP ?Clinical Pharmacist ?[facility] ?[place] ?[phone] ? ? ? ?  ? ?  ? ? ?

## 2021-05-14 ENCOUNTER — Ambulatory Visit: Payer: Medicare Other

## 2021-05-14 DIAGNOSIS — M25612 Stiffness of left shoulder, not elsewhere classified: Secondary | ICD-10-CM | POA: Diagnosis not present

## 2021-05-14 DIAGNOSIS — R293 Abnormal posture: Secondary | ICD-10-CM | POA: Diagnosis not present

## 2021-05-14 DIAGNOSIS — M79602 Pain in left arm: Secondary | ICD-10-CM | POA: Diagnosis not present

## 2021-05-14 DIAGNOSIS — C50511 Malignant neoplasm of lower-outer quadrant of right female breast: Secondary | ICD-10-CM

## 2021-05-14 NOTE — Therapy (Signed)
[facility] ?[facility] ?[place], [place], [address] ?Phone: [phone]   Fax:  [phone] ? ?Physical Therapy Treatment ? ?Patient Details  ?Name: Carrie Singh ?MRN: [id number] ?Date of Birth: 1955-10-06 ?Referring Provider (PT): Thedore Mins ? ? ?Encounter Date: 05/14/2021 ? ? PT End of Session - 05/14/21 0902   ? ? Visit Number 11   ? Number of Visits 14   ? Date for PT Re-Evaluation 05/19/21   ? PT Start Time [phone]   ? PT Stop Time [phone]   ? PT Time Calculation (min) 49 min   ? Activity Tolerance Patient tolerated treatment well   ? Behavior During Therapy [facility] for tasks assessed/performed   ? ?  ?  ? ?  ? ? ?Past Medical History:  ?Diagnosis Date  ? Breast cancer ([place]) 01/2021  ? left breast IDC  ? Breast cancer, right breast ([place]) 12/2007  ? a. s/p chemo, radiation, and lumpectomy with negative sentinel lymph node biopsy   ? Complication of anesthesia   ? woke up during colonoscopy  ? Depression   ? Diabetes mellitus without complication ([place])   ? GERD (gastroesophageal reflux disease)   ? Hyperlipidemia   ? Hypertension   ? resistant, negative renal Dopplers and negative CT angiogram  ? Migraines   ? Obesity   ? ? ?Past Surgical History:  ?Procedure Laterality Date  ? ABDOMINAL HYSTERECTOMY    ? BREAST LUMPECTOMY    ? CYST EXCISION    ? FOOT SURGERY    ? MASTECTOMY Right   ? MASTECTOMY W/ SENTINEL NODE BIOPSY Right 03/18/2016  ? Procedure: RIGHT MASTECTOMY WITH RIGHT AXILLARY SENTINEL LYMPH NODE BIOPSY;  Surgeon: Alphonsa Overall, MD;  Location: [place];  Service: General;  Laterality: Right;  ? MASTECTOMY W/ SENTINEL NODE BIOPSY Left 03/10/2021  ? Procedure: LEFT MASTECTOMY WITH LEFT SENTINEL LYMPH NODE BIOPSY;  Surgeon: Erroll Luna, MD;  Location: [facility];  Service: General;  Laterality: Left;  ? ovary tumor    ? vocal cord nodules    ? ? ?There were no vitals filed for this visit. ? ? Subjective Assessment - 05/14/21 0904   ? ?  Subjective Cords are still there but not as bad and wrist is still sore but it doesn't stop me from doing anything.   ? Pertinent History She has a history of right breast cancer treated initially back in 2009 with breast conserving surgery. She had additional cancer in 2018 and right breast underwent simple mastectomy. She had symptom mapping both times and radiation with her lumpectomy. 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 She had a left mastectomy on 03/10/2021. It was ER =, PR0, HER 2 - with a Ki 31 of 15% Pt has a hx of renal disease, DM, shingles. She is on disability. Right shoulder ROM has been limited since prior surgeries.  she never had rehab.   ? Patient Stated Goals post surgical reassessment   ? Currently in Pain? No/denies   ? Pain Score 4    ? Pain Location Wrist   ? Pain Orientation Left   ? Pain Descriptors / Indicators Tightness;Tender   ? Pain Onset 1 to 4 weeks ago   ? Pain Frequency Constant   ? ?  ?  ? ?  ? ? ? ? ? ? ? ? ? ? ? ? ? ? ? ? ? ? ? ?  [facility] Adult PT Treatment/Exercise - 05/14/21 0001   ? ?  ? Exercises  ? Other Exercises  FreeMotion Machine 10# x 10 reps for scapular retraction returning therapist demo and tactile cues during for correct UE position   ?  ? Shoulder Exercises: Supine  ? Horizontal ABduction Strengthening;Both;10 reps;Theraband   ? Theraband Level (Shoulder Horizontal ABduction) Level 1 (Yellow)   ? External Rotation Strengthening;Both;5 reps;Theraband   ? Theraband Level (Shoulder External Rotation) Level 1 (Yellow)   ? Flexion Strengthening;Both;5 reps;Theraband   Narrow and Wide Grip, 5x each  ? Theraband Level (Shoulder Flexion) Level 1 (Yellow)   ? Diagonals Strengthening;Right;Left;5 reps;Theraband   ? Theraband Level (Shoulder Diagonals) Level 1 (Yellow)   ? Other Supine Exercises AROM bilateral flexion, scaption,  and horizontal abduction x 5   ?  ? Manual Therapy  ? Soft tissue mobilization Soft tissue mobilization to left pecs, lats, UT with cocoa butter,   ? Myofascial Release MFR to cording in left forearm   ? Passive ROM PROM to left shoulder flexion, abduction, and D2 to pts tolerance   ? ?  ?  ? ?  ? ? ? ? ? ? ? ? ? ? ? ? ? ? ? PT Long Term Goals - 05/10/21 0944   ? ?  ? PT LONG TERM GOAL #1  ? Title Pt will restore left shoulder ROM to within 5-10 degrees of pre-op levels for functional use of left UE   ? Time 6   ? Period Weeks   ? Status Achieved   ? Target Date 04/20/21   ?  ? PT LONG TERM GOAL #2  ? Title Pt will have decreased axillary and arm pain from cording by 50% or more   ? Baseline 70%   ? Time 6   ? Status Achieved   ? Target Date 04/20/21   ?  ? PT LONG TERM GOAL #3  ? Title Pt will attend ABC classs   ? Time 4   ? Status Achieved   ? Target Date 05/19/21   ?  ? PT LONG TERM GOAL #4  ? Title Pt will be fit for prophylactic compression sleeve/gauntlet   ? Time 6   ? Period Weeks   ? Status On-going   ? Target Date 05/19/21   ?  ? PT LONG TERM GOAL #5  ? Title Pt willhave atleast 130 degrees of left shoulder flexion and abduction for improved ability to perform home activities   ? Time 6   ? Period Weeks   ? Target Date 04/20/21   ? ?  ?  ? ?  ? ? ? ? ? ? ? ? Plan - 05/14/21 0947   ? ? Clinical Impression Statement Pt is making good progress with ROM and had minimal tenderness today with soft tissue mobilization.  There are still some cords present but not limiting motion too much.  She is awaiting her compression garments. She is complaining of some wrist pain and feels is related to cords, but no cords are palbable there .   ? Personal Factors and Comorbidities Comorbidity 3+   ? Comorbidities Active CA, prior right breast surgeries/infection, DM, kidney disease   ? Stability/Clinical Decision Making Stable/Uncomplicated   ? Rehab Potential Good   ? PT Frequency 2x / week   ? PT Duration 6 weeks   ? PT  Treatment/Interventions ADLs/Self Care Home Management;Therapeutic exercise;Patient/family education;Scar mobilization;Orthotic Fit/Training;Manual techniques;Passive range of motion;Manual lymph drainage;Compression bandaging   ?  PT Next Visit Plan awaiting sleeves and gauntlets, MFR, STM prn check goals, exercises   ? PT Home Exercise Plan 4 post op exercises; supine scapular series   ? Consulted and Agree with Plan of Care Patient   ? ?  ?  ? ?  ? ? ?Patient will benefit from skilled therapeutic intervention in order to improve the following deficits and impairments:  Decreased range of motion, Decreased knowledge of precautions, Postural dysfunction, Impaired UE functional use, Pain, Decreased scar mobility, Impaired sensation, Increased edema, Decreased mobility, Decreased strength ? ?Visit Diagnosis: ?Pain in left arm ? ?Stiffness of left shoulder, not elsewhere classified ? ?Abnormal posture ? ?Malignant neoplasm of lower-outer quadrant of right female breast, unspecified estrogen receptor status ([place]) ? ? ? ? ?Problem List ?Patient Active Problem List  ? Diagnosis Date Noted  ? Class 1 obesity due to excess calories with body mass index (BMI) of 34.0 to 34.9 in adult 08/21/2020  ? CKD (chronic kidney disease) stage 3, GFR 30-59 ml/min (HCC) 05/30/2019  ? GERD (gastroesophageal reflux disease) 11/30/2017  ? Intractable episodic cluster headache 12/04/2014  ? Depression with somatization 12/04/2014  ? HTN (hypertension) 04/24/2013  ? HLD (hyperlipidemia) 04/24/2013  ? DM2 (diabetes mellitus, type 2) ([place]) 04/24/2013  ? Malignant neoplasm of lower-outer quadrant of right breast of female, estrogen receptor positive ([place]) 12/18/2007  ? ? ?Claris Pong, PT ?05/14/2021, 9:58 AM ? ?[place] ?[facility] [place] ?[place], [place], [address] ?Phone: [phone]   Fax:  [phone] ? ?Name: FLORENA KOZMA ?MRN: [id number] ?Date of Birth: [birth year] ? ? ? ?

## 2021-05-17 ENCOUNTER — Ambulatory Visit: Payer: Medicare Other | Attending: Adult Health

## 2021-05-17 DIAGNOSIS — M79602 Pain in left arm: Secondary | ICD-10-CM | POA: Diagnosis not present

## 2021-05-17 DIAGNOSIS — C50511 Malignant neoplasm of lower-outer quadrant of right female breast: Secondary | ICD-10-CM | POA: Insufficient documentation

## 2021-05-17 DIAGNOSIS — R293 Abnormal posture: Secondary | ICD-10-CM | POA: Insufficient documentation

## 2021-05-17 DIAGNOSIS — M25612 Stiffness of left shoulder, not elsewhere classified: Secondary | ICD-10-CM | POA: Diagnosis not present

## 2021-05-17 NOTE — Therapy (Signed)
[place] ?[facility] ?[place], [place], [address] ?Phone: [phone]   Fax:  [phone] ? ?Physical Therapy Treatment ? ?Patient Details  ?Name: Carrie Singh ?MRN: [id number] ?Date of Birth: 1955/10/29 ?Referring Provider (PT): Thedore Mins ? ? ?Encounter Date: 05/17/2021 ? ? PT End of Session - 05/17/21 1053   ? ? Visit Number 12   ? Number of Visits 14   ? Date for PT Re-Evaluation 05/19/21   ? PT Start Time 1005   ? PT Stop Time 1054   ? PT Time Calculation (min) 49 min   ? Activity Tolerance Patient tolerated treatment well   ? Behavior During Therapy [facility] for tasks assessed/performed   ? ?  ?  ? ?  ? ? ?Past Medical History:  ?Diagnosis Date  ? Breast cancer ([place]) 01/2021  ? left breast IDC  ? Breast cancer, right breast ([place]) 12/2007  ? a. s/p chemo, radiation, and lumpectomy with negative sentinel lymph node biopsy   ? Complication of anesthesia   ? woke up during colonoscopy  ? Depression   ? Diabetes mellitus without complication ([place])   ? GERD (gastroesophageal reflux disease)   ? Hyperlipidemia   ? Hypertension   ? resistant, negative renal Dopplers and negative CT angiogram  ? Migraines   ? Obesity   ? ? ?Past Surgical History:  ?Procedure Laterality Date  ? ABDOMINAL HYSTERECTOMY    ? BREAST LUMPECTOMY    ? CYST EXCISION    ? FOOT SURGERY    ? MASTECTOMY Right   ? MASTECTOMY W/ SENTINEL NODE BIOPSY Right 03/18/2016  ? Procedure: RIGHT MASTECTOMY WITH RIGHT AXILLARY SENTINEL LYMPH NODE BIOPSY;  Surgeon: Alphonsa Overall, MD;  Location: [facility];  Service: General;  Laterality: Right;  ? MASTECTOMY W/ SENTINEL NODE BIOPSY Left 03/10/2021  ? Procedure: LEFT MASTECTOMY WITH LEFT SENTINEL LYMPH NODE BIOPSY;  Surgeon: Erroll Luna, MD;  Location: [facility];  Service: General;  Laterality: Left;  ? ovary tumor    ? vocal cord nodules    ? ? ?There were no vitals filed for this visit. ? ? Subjective Assessment - 05/17/21 1005   ? ?  Subjective I worked on my wrist like you do and I felt a little pop. I wear the sleeve that you cut for me but not to church.   ? Pertinent History She has a history of right breast cancer treated initially back in 2009 with breast conserving surgery. She had additional cancer in 2018 and right breast underwent simple mastectomy. She had symptom mapping both times and radiation with her lumpectomy. 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 She had a left mastectomy on 03/10/2021. It was ER =, PR0, HER 2 - with a Ki 48 of 15% Pt has a hx of renal disease, DM, shingles. She is on disability. Right shoulder ROM has been limited since prior surgeries.  she never had rehab.   ? Patient Stated Goals post surgical reassessment   ? Currently in Pain? No/denies   ? Pain Score 0-No pain   ? Multiple Pain Sites No   ? ?  ?  ? ?  ? ? ? ? ? ? ? ? ? ? ? ? ? ? ? ? ? ? ? ? [facility] Adult PT  Treatment/Exercise - 05/17/21 0001   ? ?  ? Exercises  ? Exercises Other Exercises   ? Other Exercises  supine LTR to right x 3   ?  ? Shoulder Exercises: Supine  ? Horizontal ABduction Strengthening;Both;5 reps   ? Theraband Level (Shoulder Horizontal ABduction) Level 1 (Yellow)   ? External Rotation Strengthening;Both;5 reps;Theraband   ? Theraband Level (Shoulder External Rotation) Level 1 (Yellow)   ? Diagonals Strengthening;Right;Left;5 reps;Theraband   ? Theraband Level (Shoulder Diagonals) Level 1 (Yellow)   ?  ? Shoulder Exercises: Pulleys  ? Flexion 2 minutes   ? Scaption 1 minute   ? ABduction 2 minutes   ? ABduction Limitations Pt with good self correcting technique today for each   ?  ? Shoulder Exercises: Therapy Ball  ? Flexion Both;10 reps   forward lean into end of stretch  ? ABduction Left;5 reps   ?  ? Manual Therapy  ? Soft tissue mobilization Soft tissue mobilization to left pecs, lats,  UT with cocoa butter, and in SL to left UT and scapular region.  ? Myofascial Release MFR to cording in left forearm   ? Passive ROM PROM to left shoulder flexion, abduction, and D2 to pts tolerance   ? ?  ?  ? ?  ? ? ? ? ? ? ? ? ? ? ? ? ? ? ? PT Long Term Goals - 05/10/21 0944   ? ?  ? PT LONG TERM GOAL #1  ? Title Pt will restore left shoulder ROM to within 5-10 degrees of pre-op levels for functional use of left UE   ? Time 6   ? Period Weeks   ? Status Achieved   ? Target Date 04/20/21   ?  ? PT LONG TERM GOAL #2  ? Title Pt will have decreased axillary and arm pain from cording by 50% or more   ? Baseline 70%   ? Time 6   ? Status Achieved   ? Target Date 04/20/21   ?  ? PT LONG TERM GOAL #3  ? Title Pt will attend ABC classs   ? Time 4   ? Status Achieved   ? Target Date 05/19/21   ?  ? PT LONG TERM GOAL #4  ? Title Pt will be fit for prophylactic compression sleeve/gauntlet   ? Time 6   ? Period Weeks   ? Status On-going   ? Target Date 05/19/21   ?  ? PT LONG TERM GOAL #5  ? Title Pt willhave atleast 130 degrees of left shoulder flexion and abduction for improved ability to perform home activities   ? Time 6   ? Period Weeks   ? Target Date 04/20/21   ? ?  ?  ? ?  ? ? ? ? ? ? ? ? Plan - 05/17/21 1045   ? ? Clinical Impression Statement Fewer cords noted in left forearm today and not limiting motion. Pt felt a pop while working on it over the weekend and may have released one. ROM doing very well and pt is using good form with exercises. Still awaiting compression garments   ? Personal Factors and Comorbidities Comorbidity 3+   ? Comorbidities Active CA, prior right breast surgeries/infection, DM, kidney disease   ? Stability/Clinical Decision Making Stable/Uncomplicated   ? Rehab Potential Good   ? PT Frequency 2x / week   ? PT Duration 6 weeks   ? PT Treatment/Interventions ADLs/Self Care Home Management;Therapeutic  exercise;Patient/family education;Scar mobilization;Orthotic Fit/Training;Manual  techniques;Passive range of motion;Manual lymph drainage;Compression bandaging   ? PT Next Visit Plan awaiting sleeves and gauntlets, MFR, STM prn check goals, exercises   ? PT Home Exercise Plan 4 post op exercises; supine scapular series   ? Consulted and Agree with Plan of Care Patient   ? ?  ?  ? ?  ? ? ?Patient will benefit from skilled therapeutic intervention in order to improve the following deficits and impairments:  Decreased range of motion, Decreased knowledge of precautions, Postural dysfunction, Impaired UE functional use, Pain, Decreased scar mobility, Impaired sensation, Increased edema, Decreased mobility, Decreased strength ? ?Visit Diagnosis: ?Pain in left arm ? ?Stiffness of left shoulder, not elsewhere classified ? ?Abnormal posture ? ?Malignant neoplasm of lower-outer quadrant of right female breast, unspecified estrogen receptor status ([place]) ? ? ? ? ?Problem List ?Patient Active Problem List  ? Diagnosis Date Noted  ? Class 1 obesity due to excess calories with body mass index (BMI) of 34.0 to 34.9 in adult 08/21/2020  ? CKD (chronic kidney disease) stage 3, GFR 30-59 ml/min (HCC) 05/30/2019  ? GERD (gastroesophageal reflux disease) 11/30/2017  ? Intractable episodic cluster headache 12/04/2014  ? Depression with somatization 12/04/2014  ? HTN (hypertension) 04/24/2013  ? HLD (hyperlipidemia) 04/24/2013  ? DM2 (diabetes mellitus, type 2) ([place]) 04/24/2013  ? Malignant neoplasm of lower-outer quadrant of right breast of female, estrogen receptor positive ([place]) 12/18/2007  ? ? ?Claris Pong, PT ?05/17/2021, 10:55 AM ? ?[facility] ?[facility] [place] ?[place], [place], [address] ?Phone: [phone]   Fax:  [phone] ? ?Name: Carrie Singh ?MRN: [id number] ?Date of Birth: 1955-01-18 ? ? ? ?

## 2021-05-20 ENCOUNTER — Ambulatory Visit: Payer: Medicare Other

## 2021-05-20 DIAGNOSIS — C50511 Malignant neoplasm of lower-outer quadrant of right female breast: Secondary | ICD-10-CM

## 2021-05-20 DIAGNOSIS — M79602 Pain in left arm: Secondary | ICD-10-CM

## 2021-05-20 DIAGNOSIS — M25612 Stiffness of left shoulder, not elsewhere classified: Secondary | ICD-10-CM | POA: Diagnosis not present

## 2021-05-20 DIAGNOSIS — R293 Abnormal posture: Secondary | ICD-10-CM

## 2021-05-20 NOTE — Therapy (Signed)
[facility] ?[facility] ?[place], [place], [address] ?Phone: [phone]   Fax:  [phone] ? ?Physical Therapy Treatment ? ?Patient Details  ?Name: Carrie Singh ?MRN: [id number] ?Date of Birth: 08/13/1955 ?Referring Provider (PT): Thedore Mins ? ? ?Encounter Date: 05/20/2021 ? ? PT End of Session - 05/20/21 0909   ? ? Visit Number 13   ? Number of Visits 17   ? Date for PT Re-Evaluation 06/17/21   ? PT Start Time [phone]   ? PT Stop Time 0955   ? PT Time Calculation (min) 50 min   ? Activity Tolerance Patient tolerated treatment well   ? Behavior During Therapy [facility] for tasks assessed/performed   ? ?  ?  ? ?  ? ? ?Past Medical History:  ?Diagnosis Date  ? Breast cancer ([place]) 01/2021  ? left breast IDC  ? Breast cancer, right breast ([place]) 12/2007  ? a. s/p chemo, radiation, and lumpectomy with negative sentinel lymph node biopsy   ? Complication of anesthesia   ? woke up during colonoscopy  ? Depression   ? Diabetes mellitus without complication ([place])   ? GERD (gastroesophageal reflux disease)   ? Hyperlipidemia   ? Hypertension   ? resistant, negative renal Dopplers and negative CT angiogram  ? Migraines   ? Obesity   ? ? ?Past Surgical History:  ?Procedure Laterality Date  ? ABDOMINAL HYSTERECTOMY    ? BREAST LUMPECTOMY    ? CYST EXCISION    ? FOOT SURGERY    ? MASTECTOMY Right   ? MASTECTOMY W/ SENTINEL NODE BIOPSY Right 03/18/2016  ? Procedure: RIGHT MASTECTOMY WITH RIGHT AXILLARY SENTINEL LYMPH NODE BIOPSY;  Surgeon: Alphonsa Overall, MD;  Location: [facility];  Service: General;  Laterality: Right;  ? MASTECTOMY W/ SENTINEL NODE BIOPSY Left 03/10/2021  ? Procedure: LEFT MASTECTOMY WITH LEFT SENTINEL LYMPH NODE BIOPSY;  Surgeon: Erroll Luna, MD;  Location: [facility];  Service: General;  Laterality: Left;  ? ovary tumor    ? vocal cord nodules    ? ? ?There were no vitals filed for this visit. ? ? Subjective Assessment - 05/20/21 0905   ? ?  Subjective I have been feeling good.  My Left shoulder still bothers me since surgery. I have not received the garments yet.   ? Pertinent History She has a history of right breast cancer treated initially back in 2009 with breast conserving surgery. She had additional cancer in 2018 and right breast underwent simple mastectomy. She had symptom mapping both times and radiation with her lumpectomy. 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 She had a left mastectomy on 03/10/2021. It was ER =, PR0, HER 2 - with a Ki 78 of 15% Pt has a hx of renal disease, DM, shingles. She is on disability. Right shoulder ROM has been limited since prior surgeries.  she never had rehab.   ? Patient Stated Goals post surgical reassessment   ? Currently in Pain? No/denies   ? Multiple Pain Sites No   ? ?  ?  ? ?  ? ? ? ? ? [facility] PT Assessment - 05/20/21 0001   ? ?  ? Assessment  ? Medical Diagnosis Left breast Cancer   ? Referring Provider (PT) Thedore Mins   ?  Onset Date/Surgical Date 03/10/21   ? Hand Dominance Right   ?  ? Precautions  ? Precaution Comments lymphedema risk   ?  ? Prior Function  ? Level of Independence Independent   ?  ? AROM  ? Left Shoulder Flexion 170 Degrees   ? Left Shoulder ABduction 179 Degrees   ? Left Shoulder External Rotation 100 Degrees   ? ?  ?  ? ?  ? ? ? ? ? ? ? ? ? ? ? ? ? ? ? ? [facility] Adult PT Treatment/Exercise - 05/20/21 0001   ? ?  ? Shoulder Exercises: Standing  ? External Rotation Strengthening;Both;10 reps;Theraband   ? Theraband Level (Shoulder External Rotation) Level 1 (Yellow)   ? Extension Strengthening;Both;10 reps;Theraband   ? Theraband Level (Shoulder Extension) Level 1 (Yellow)   ? Retraction Strengthening;Both;10 reps   ? Theraband Level (Shoulder Retraction) Level 1 (Yellow)   ?  ? Shoulder Exercises: Pulleys  ? Flexion 2 minutes   ?  Scaption 1 minute   ? ABduction 2 minutes   ? ABduction Limitations Pt with good self correcting technique today for each   ?  ? Shoulder Exercises: Therapy Ball  ? Flexion Both;10 reps   forward lean into end of stretch  ? ABduction Left;5 reps   ?  ? Manual Therapy  ? Soft tissue mobilization Soft tissue mobilization to left pecs, lats, UT in supine, and in SL to left scapular area and  with cocoa butter,   ? Myofascial Release MFR to cording in left forearm   ? Passive ROM PROM to left shoulder flexion, abduction, and D2 to pts tolerance   ? ?  ?  ? ?  ? ? ? ? ? ? ? ? ? ? PT Education - 05/20/21 1004   ? ? Education Details standing theraband Scap. retraction, extension, bilateral ER and standing jobes flex and scaption   ? Person(s) Educated Patient   ? Methods Explanation;Handout;Demonstration   ? Comprehension Returned demonstration   ? ?  ?  ? ?  ? ? ? ? ? ? PT Long Term Goals - 05/20/21 0935   ? ?  ? PT LONG TERM GOAL #1  ? Title Pt will restore left shoulder ROM to within 5-10 degrees of pre-op levels for functional use of left UE   ? Time 6   ? Period Weeks   ? Status Achieved   ? Target Date 04/20/21   ?  ? PT LONG TERM GOAL #2  ? Title Pt will have decreased axillary and arm pain from cording by 50% or more   ? Baseline 70%   ? Period Weeks   ? Status Achieved   ? Target Date 04/20/21   ?  ? PT LONG TERM GOAL #3  ? Title Pt will attend ABC classs   ? Time 4   ? Period Weeks   ? Status Achieved   ? Target Date 05/19/21   ?  ? PT LONG TERM GOAL #4  ? Title Pt will be fit for prophylactic compression sleeve/gauntlet   ? Baseline fit but not received   ? Time 6   ? Period Weeks   ? Status On-going   ? Target Date 05/19/21   ?  ? PT LONG TERM GOAL #5  ? Title Pt willhave atleast 130 degrees of left shoulder flexion and abduction for improved ability to perform home activities   ? Time 6   ? Period Weeks   ?  Status Achieved   ? Target Date 04/20/21   ? ?  ?  ? ?  ? ? ? ? ? ? ? ? Plan - 05/20/21 0945   ? ?  Clinical Impression Statement No cords noted in forearm today. Pt making excellent progress and has achieved goals established except she has not received her compression garments.  She was fit last week and they should be in soon.  We will do her screen at her last visit and check garments. Overall pain is 99% better, but she still has some shoulder pain when she reaches  back.   ? Personal Factors and Comorbidities Comorbidity 3+   ? Comorbidities Active CA, prior right breast surgeries/infection, DM, kidney disease   ? Stability/Clinical Decision Making Stable/Uncomplicated   ? Rehab Potential Good   ? PT Frequency 2x / week   ? PT Duration 6 weeks   ? PT Treatment/Interventions ADLs/Self Care Home Management;Therapeutic exercise;Patient/family education;Scar mobilization;Orthotic Fit/Training;Manual techniques;Passive range of motion;Manual lymph drainage;Compression bandaging   ? PT Next Visit Plan awaiting sleeves and gauntlets, SOZO screen, MFR, STM prn check goals, exercises   ? PT Home Exercise Plan 4 post op exercises; supine scapular series   ? Consulted and Agree with Plan of Care Patient   ? ?  ?  ? ?  ? ? ?Patient will benefit from skilled therapeutic intervention in order to improve the following deficits and impairments:  Decreased range of motion, Decreased knowledge of precautions, Postural dysfunction, Impaired UE functional use, Pain, Decreased scar mobility, Impaired sensation, Increased edema, Decreased mobility, Decreased strength ? ?Visit Diagnosis: ?Pain in left arm ? ?Stiffness of left shoulder, not elsewhere classified ? ?Abnormal posture ? ?Malignant neoplasm of lower-outer quadrant of right female breast, unspecified estrogen receptor status ([place]) ? ? ? ? ?Problem List ?Patient Active Problem List  ? Diagnosis Date Noted  ? Class 1 obesity due to excess calories with body mass index (BMI) of 34.0 to 34.9 in adult 08/21/2020  ? CKD (chronic kidney disease) stage 3, GFR 30-59 ml/min (HCC)  05/30/2019  ? GERD (gastroesophageal reflux disease) 11/30/2017  ? Intractable episodic cluster headache 12/04/2014  ? Depression with somatization 12/04/2014  ? HTN (hypertension) 04/24/2013  ? HLD (hyperlipidemia) 0

## 2021-05-20 NOTE — Patient Instructions (Signed)
Pt given illustrated and written instructions for standing theraband and jobes exercises ?

## 2021-05-26 ENCOUNTER — Ambulatory Visit: Payer: Medicare Other | Admitting: Pharmacist

## 2021-05-26 ENCOUNTER — Telehealth: Payer: Medicare Other

## 2021-05-26 DIAGNOSIS — I10 Essential (primary) hypertension: Secondary | ICD-10-CM

## 2021-05-26 NOTE — Chronic Care Management (AMB) (Signed)
?Chronic Care Management  ? ?Outreach Note ? ?05/26/2021 ?Name: NORIAH OSGOOD MRN: [id number] DOB: 1955/05/17 ? ?I connected with [name] on 05/26/21 by telephone outreach and verified that I am speaking with the correct person using two identifiers. ? ?Patient appearing on report for [place] Metric Hypertension Control due to documented ambulatory blood pressure of 187/86 on 04/16/2021. Next appointment with PCP is not currently scheduled. ? ?Receive a voicemail from patient requesting a call back. ? ?Outreached patient to discuss hypertension control and medication management.  ? ?Outpatient Encounter Medications as of 05/26/2021  ?Medication Sig Note  ? amLODipine (NORVASC) 2.5 MG tablet Take 2.5 mg by mouth daily.   ? carvedilol (COREG) 12.5 MG tablet Take 12.5 mg by mouth in the morning and at bedtime.   ? hydrALAZINE (APRESOLINE) 100 MG tablet TAKE 1 TABLET BY MOUTH 3  TIMES DAILY   ? irbesartan (AVAPRO) 300 MG tablet TAKE 1 TABLET BY MOUTH  DAILY   ? metFORMIN (GLUCOPHAGE) 1000 MG tablet Take 1,000 mg by mouth 2 (two) times daily with a meal.   ? repaglinide (PRANDIN) 0.5 MG tablet Take 0.5 mg by mouth 3 (three) times daily before meals.   ? Biotin w/ Vitamins C & E (HAIR SKIN & NAILS GUMMIES PO) Take by mouth.   ? calcium carbonate (OSCAL) 1500 (600 Ca) MG TABS tablet Take by mouth 2 (two) times daily with a meal.   ? cholecalciferol (VITAMIN D3) 25 MCG (1000 UNIT) tablet Take 2,000 Units by mouth daily.   ? Lancets (ONETOUCH DELICA PLUS [id number]) MISC CHECK BLOOD SUGAR TWICE  DAILY   ? letrozole (FEMARA) 2.5 MG tablet Take 1 tablet (2.5 mg total) by mouth daily.   ? Multiple Vitamins-Minerals (ONE-A-DAY WOMENS VITACRAVES) CHEW Chew 1 tablet by mouth daily.    ? ONETOUCH ULTRA test strip USE TWICE DAILY AS DIRECTED   ? pravastatin (PRAVACHOL) 40 MG tablet TAKE 1 TABLET BY MOUTH DAILY   ? SHINGRIX injection Inject 0.5 mL into muscle for shingles vaccine. Repeat dose in 2-6 months. (Patient not  taking: Reported on 04/16/2021) 04/16/2021: Hasn't gotten injection yet   ? ?No facility-administered encounter medications on file as of 05/26/2021.  ? ? ?Lab Results  ?Component Value Date  ? CREATININE 1.07 (H) 03/05/2021  ? BUN 14 03/05/2021  ? NA 138 03/05/2021  ? K 3.6 03/05/2021  ? CL 104 03/05/2021  ? CO2 26 03/05/2021  ? ? ?BP Readings from Last 3 Encounters:  ?04/16/21 (!) 187/86  ?04/12/21 130/82  ?03/18/21 (!) 155/84  ? ? ?Pulse Readings from Last 3 Encounters:  ?04/16/21 84  ?04/12/21 77  ?03/18/21 81  ? ? ?Current medications:  ?amlodipine 2.5 mg daily ?carvedilol 12.5 mg twice daily ?hydralazine 100 mg three times daily ?irbesartan 300 mg daily ?  ?Patient uses weekly pillbox to organize her medications for three times/day dosing. ?Reports has improved consistency with taking her medications and now denies missing doses ?Encourage patient to continue using weekly pillbox  ?  ?Home Monitoring: ?Patient has an automated upper arm home BP machine, but does not use as cuff does not fit around her upper arm. ?Has discussed importance of having cuff that fits her arm for readings to be accurate ?Patient reports monitor with correct cuff size is not available through her health plan over the counter benefit (OTC) benefit, but planning to pick one up from a local medical supply store ?Have counseled on BP monitoring technique ?Current blood pressure readings:  Denies monitoring recently ?  ?Current physical activity: walking ~15-30 minutes/day x 3-5 days/week. Reports working on goal of 30 minutes/day ?  ?Reports rarely drinks caffeine ?  ?Denies adding salt to her food. Discuss impact of salt/sodium on blood pressure control ? ? ?Type 2 Diabetes: ?Patient followed by [facility] ?Controlled; current treatment: ?Metformin 1000 mg twice daily ?Repaglinide 0.5 mg three times daily before meals ? ?Reports recent morning fasting blood sugars ranging 86-106 ?Denies recent hypoglycemia ?Counsel on  symptoms of hypoglycemia and how to manage lows ? ?Encourage patient to have regular well-balanced meals, while controlling carbohydrate portion sizes ?Encourage patient to avoid skipping meals ? ? ?Assessment/Plan: ?- Currently uncontrolled ?- Reviewed goal blood pressure <130/80 ?- Reviewed appropriate administration of medication regimen ?- Counseled on long term microvascular and macrovascular complications of uncontrolled hypertension ?- Reviewed to check blood pressure, document, and provide at next provider visit ?- Discussed dietary modifications, such as reduced salt intake, focus on whole grains, vegetables, lean proteins ?- Discussed goal of 150 minutes of moderate intensity physical activity weekly ? ? ?Follow Up Plan: CM Pharmacist will outreach to patient by telephone again on 06/09/2021 at 8:30 AM ? ?Wallace Cullens, PharmD, BCACP ?Clinical Pharmacist ?[facility] ?[place] ?[phone] ? ?

## 2021-05-26 NOTE — Patient Instructions (Signed)
Check your blood pressure, and any time you have concerning symptoms like headache, chest pain, dizziness, shortness of breath, or vision changes.  ? ?Our goal is less than 130/80. ? ?To appropriately check your blood pressure, make sure you do the following:  ?1) Avoid caffeine, exercise, or tobacco products for 30 minutes before checking. Empty your bladder. ?2) Sit with your back supported in a flat-backed chair. Rest your arm on something flat (arm of the chair, table, etc). ?3) Sit still with your feet flat on the floor, resting, for at least 5 minutes.  ?4) Check your blood pressure. Take 1-2 readings.  ?5) Write down these readings and bring with you to any provider appointments. ? ?Bring your home blood pressure machine with you to a provider's office for accuracy comparison at least once a year.  ? ?Make sure you take your blood pressure medications before you come to any office visit, even if you were asked to fast for labs. ? ?Wallace Cullens, PharmD, BCACP, CPP ?Clinical Pharmacist ?[facility] ?[place] ?[phone] ? ?

## 2021-06-02 ENCOUNTER — Ambulatory Visit: Payer: Medicare Other

## 2021-06-02 DIAGNOSIS — M25612 Stiffness of left shoulder, not elsewhere classified: Secondary | ICD-10-CM

## 2021-06-02 DIAGNOSIS — C50511 Malignant neoplasm of lower-outer quadrant of right female breast: Secondary | ICD-10-CM | POA: Diagnosis not present

## 2021-06-02 DIAGNOSIS — R293 Abnormal posture: Secondary | ICD-10-CM | POA: Diagnosis not present

## 2021-06-02 DIAGNOSIS — M79602 Pain in left arm: Secondary | ICD-10-CM

## 2021-06-02 NOTE — Therapy (Signed)
[facility] [place] ?[place], [place], [address] ?Phone: [phone]   Fax:  [phone] ? ?Physical Therapy Treatment ? ?Patient Details  ?Name: Carrie Singh ?MRN: [id number] ?Date of Birth: 20-May-1955 ?Referring Provider (PT): Thedore Mins ? ? ?Encounter Date: 06/02/2021 ? ? PT End of Session - 06/02/21 0904   ? ? Visit Number 14   ? Number of Visits 17   ? Date for PT Re-Evaluation 06/17/21   ? PT Start Time [phone]   ? PT Stop Time 1884   ? PT Time Calculation (min) 40 min   ? Activity Tolerance Patient tolerated treatment well   ? Behavior During Therapy [facility] for tasks assessed/performed   ? ?  ?  ? ?  ? ? ?Past Medical History:  ?Diagnosis Date  ? Breast cancer ([place]) 01/2021  ? left breast IDC  ? Breast cancer, right breast ([place]) 12/2007  ? a. s/p chemo, radiation, and lumpectomy with negative sentinel lymph node biopsy   ? Complication of anesthesia   ? woke up during colonoscopy  ? Depression   ? Diabetes mellitus without complication ([place])   ? GERD (gastroesophageal reflux disease)   ? Hyperlipidemia   ? Hypertension   ? resistant, negative renal Dopplers and negative CT angiogram  ? Migraines   ? Obesity   ? ? ?Past Surgical History:  ?Procedure Laterality Date  ? ABDOMINAL HYSTERECTOMY    ? BREAST LUMPECTOMY    ? CYST EXCISION    ? FOOT SURGERY    ? MASTECTOMY Right   ? MASTECTOMY W/ SENTINEL NODE BIOPSY Right 03/18/2016  ? Procedure: RIGHT MASTECTOMY WITH RIGHT AXILLARY SENTINEL LYMPH NODE BIOPSY;  Surgeon: Alphonsa Overall, MD;  Location: [place] [facility];  Service: General;  Laterality: Right;  ? MASTECTOMY W/ SENTINEL NODE BIOPSY Left 03/10/2021  ? Procedure: LEFT MASTECTOMY WITH LEFT SENTINEL LYMPH NODE BIOPSY;  Surgeon: Erroll Luna, MD;  Location: [facility];  Service: General;  Laterality: Left;  ? ovary tumor    ? vocal cord nodules    ? ? ?There were no vitals filed for this visit. ? ? Subjective Assessment - 06/02/21 0906   ? ?  Subjective The cording is good and the discomfort in the hand is about gone.  I still have some pain in the posterior shoulder when I reach back at times.  I feel like I am doing really well overall.  Pain is 95% better  ? ?  ?  ? ?  ? ? ? ? ? [facility] PT Assessment - 06/02/21 0001   ? ?  ? AROM  ? Left Shoulder Flexion 170 Degrees   ? Left Shoulder ABduction 180 Degrees   ? Left Shoulder External Rotation 100 Degrees   ? ?  ?  ? ?  ? ? ? ? ? L-DEX FLOWSHEETS - 06/02/21 0900   ? ?  ? L-DEX LYMPHEDEMA SCREENING  ? Measurement Type Bilateral   ? L-DEX MEASUREMENT EXTREMITY Upper Extremity   ? POSITION  Standing   ? DOMINANT SIDE Right   ? At Risk Side Left   ? BASELINE SCORE (UNILATERAL) -4.4   ? L-DEX SCORE (UNILATERAL) -2.2   ? VALUE CHANGE (UNILAT) 2.2   ? Comment wt 207.8 lbs   ? ?  ?  ? ?  ? ? ? ? ? ? ? ? ? ? ? ? ? ? ? ? ? ? ? ? ? ? ? ? ? ?  PT Long Term Goals - 06/02/21 0940   ? ?  ? PT LONG TERM GOAL #1  ? Title Pt will restore left shoulder ROM to within 5-10 degrees of pre-op levels for functional use of left UE   ? Time 6   ? Period Weeks   ? Status Achieved   ? Target Date 04/20/21   ?  ? PT LONG TERM GOAL #2  ? Title Pt will have decreased axillary and arm pain from cording by 50% or more   ? Baseline 95%   ? Time 6   ? Period Weeks   ? Status Achieved   ? Target Date 04/20/21   ?  ? PT LONG TERM GOAL #3  ? Title Pt will attend ABC classs   ? Time 4   ? Period Weeks   ? Status Achieved   ? Target Date 05/19/21   ?  ? PT LONG TERM GOAL #4  ? Title Pt will be fit for prophylactic compression sleeve/gauntlet   ? Baseline received and fits well   ? Time 6   ? Period Weeks   ? Status Achieved   ? Target Date 06/02/21   ?  ? PT LONG TERM GOAL #5  ? Time 6   ? Period Weeks   ? Status Achieved   ? Target Date 04/20/21   ? ?  ?  ? ?  ? ? ? ? ? ? ? ? Plan - 06/02/21 0946   ? ? Clinical Impression Statement Pt has achieved all goals established. She has excellent ROM, and pain is improved by 95% or better. She has no  complaints with cording.  She received her garments and she was able to don independently with rubber gloves. We discussed wear times of sleeve and gauntlet and laundering instructions. We performed her SOZO screen and she is well within the green zone. She has no further PT needs at this time. She is discharged   ? Personal Factors and Comorbidities Comorbidity 3+   ? Comorbidities Active CA, prior right breast surgeries/infection, DM, kidney disease   ? Stability/Clinical Decision Making Stable/Uncomplicated   ? Rehab Potential Good   ? PT Frequency 2x / week   ? PT Duration 6 weeks   ? PT Treatment/Interventions ADLs/Self Care Home Management;Therapeutic exercise;Patient/family education;Scar mobilization;Orthotic Fit/Training;Manual techniques;Passive range of motion;Manual lymph drainage;Compression bandaging   ? PT Next Visit Plan Pt is discharged. She has no further PT needs at this time   ? PT Home Exercise Plan 4 post op exercises; supine scapular series   ? Recommended Other Services received garments and able to don   ? Consulted and Agree with Plan of Care Patient   ? ?  ?  ? ?  ? ? ?Patient will benefit from skilled therapeutic intervention in order to improve the following deficits and impairments:  Decreased range of motion, Decreased knowledge of precautions, Postural dysfunction, Impaired UE functional use, Pain, Decreased scar mobility, Impaired sensation, Increased edema, Decreased mobility, Decreased strength ? ?Visit Diagnosis: ?Pain in left arm ? ?Stiffness of left shoulder, not elsewhere classified ? ?Abnormal posture ? ?Malignant neoplasm of lower-outer quadrant of right female breast, unspecified estrogen receptor status ([place]) ? ? ? ? ?Problem List ?Patient Active Problem List  ? Diagnosis Date Noted  ? Class 1 obesity due to excess calories with body mass index (BMI) of 34.0 to 34.9 in adult 08/21/2020  ? CKD (chronic kidney disease) stage 3, GFR 30-59 ml/min (HCC)  05/30/2019  ? GERD  (gastroesophageal reflux disease) 11/30/2017  ? Intractable episodic cluster headache 12/04/2014  ? Depression with somatization 12/04/2014  ? HTN (hypertension) 04/24/2013  ? HLD (hyperlipidemia) 04/24/2013  ? DM2 (diabetes mellitus, type 2) ([place]) 04/24/2013  ? Malignant neoplasm of lower-outer quadrant of right breast of female, estrogen receptor positive ([place]) 12/18/2007  ?PHYSICAL THERAPY DISCHARGE SUMMARY ? ?Visits from Start of Care: 14 ? ?Current functional level related to goals / functional outcomes: ?Achieved all goals ?  ?Remaining deficits: ?Occasional posterior shoulder pain reaching into the back of the car from front seat ?  ?Education / Equipment: ?Compression sleeve and gauntlet  ? ?Patient agrees to discharge. Patient goals were met. Patient is being discharged due to meeting the stated rehab goals. ? ? ?Claris Pong, PT ?06/02/2021, 9:50 AM ? ?[facility] ?[facility] [place] ?[place], [place], [address] ?Phone: [phone]   Fax:  [phone] ? ?Name: Carrie Singh ?MRN: [id number] ?Date of Birth: 1955-06-20 ? ? ? ?

## 2021-06-07 ENCOUNTER — Ambulatory Visit: Payer: Medicare Other

## 2021-06-09 ENCOUNTER — Ambulatory Visit: Payer: Medicare Other | Admitting: Pharmacist

## 2021-06-09 DIAGNOSIS — I10 Essential (primary) hypertension: Secondary | ICD-10-CM

## 2021-06-09 NOTE — Chronic Care Management (AMB) (Signed)
Chronic Care Management   Outreach Note  06/09/2021 Name: Carrie Singh MRN: [id number] DOB: Jun 05, 1955  I connected with Carrie Singh on 06/09/21 by telephone outreach and verified that I am speaking with the correct person using two identifiers.  Patient appearing on report for [place] Metric Hypertension Control due to documented ambulatory blood pressure of 187/86 on 04/16/2021. Next appointment with PCP is not currently scheduled.  Outreached patient to discuss hypertension control and medication management.   Outpatient Encounter Medications as of 06/09/2021  Medication Sig Note   amLODipine (NORVASC) 2.5 MG tablet Take 2.5 mg by mouth daily.    Biotin w/ Vitamins C & E (HAIR SKIN & NAILS GUMMIES PO) Take by mouth.    calcium carbonate (OSCAL) 1500 (600 Ca) MG TABS tablet Take by mouth 2 (two) times daily with a meal.    carvedilol (COREG) 12.5 MG tablet Take 12.5 mg by mouth in the morning and at bedtime.    cholecalciferol (VITAMIN D3) 25 MCG (1000 UNIT) tablet Take 2,000 Units by mouth daily.    hydrALAZINE (APRESOLINE) 100 MG tablet TAKE 1 TABLET BY MOUTH 3  TIMES DAILY    irbesartan (AVAPRO) 300 MG tablet TAKE 1 TABLET BY MOUTH  DAILY    Lancets (ONETOUCH DELICA PLUS [id number]) MISC CHECK BLOOD SUGAR TWICE  DAILY    letrozole (FEMARA) 2.5 MG tablet Take 1 tablet (2.5 mg total) by mouth daily.    metFORMIN (GLUCOPHAGE) 1000 MG tablet Take 1,000 mg by mouth 2 (two) times daily with a meal.    Multiple Vitamins-Minerals (ONE-A-DAY WOMENS VITACRAVES) CHEW Chew 1 tablet by mouth daily.     ONETOUCH ULTRA test strip USE TWICE DAILY AS DIRECTED    pravastatin (PRAVACHOL) 40 MG tablet TAKE 1 TABLET BY MOUTH DAILY    repaglinide (PRANDIN) 0.5 MG tablet Take 0.5 mg by mouth 3 (three) times daily before meals.    SHINGRIX injection Inject 0.5 mL into muscle for shingles vaccine. Repeat dose in 2-6 months. (Patient not taking: Reported on 04/16/2021) 04/16/2021: Hasn't gotten injection  yet    No facility-administered encounter medications on file as of 06/09/2021.    Lab Results  Component Value Date   CREATININE 1.07 (H) 03/05/2021   BUN 14 03/05/2021   NA 138 03/05/2021   K 3.6 03/05/2021   CL 104 03/05/2021   CO2 26 03/05/2021    BP Readings from Last 3 Encounters:  04/16/21 (!) 187/86  04/12/21 130/82  03/18/21 (!) 155/84    Pulse Readings from Last 3 Encounters:  04/16/21 84  04/12/21 77  03/18/21 81    Current medications:  amlodipine 2.5 mg daily carvedilol 12.5 mg twice daily hydralazine 100 mg three times daily irbesartan 300 mg daily   Patient uses weekly pillbox to organize her medications for three times/day dosing. Reports has improved consistency with taking her medications and now denies missing doses Encourage patient to continue using weekly pillbox    Home Monitoring: Reports now has an automated upper arm home BP machine with cuff that fits around her upper arm Reports recent home blood pressure readings: 5/24: 155/98, HR 78 5/23: 157/95, HR 70 5/22: 155/97, HR 74 5/21: 156/88, HR 74 (checked manually by RN at church) 5/20: 152/93, HR 87 Counsel on BP monitoring technique   Current physical activity: walking ~15-30 minutes/day x 3 days/week. Reports working on goal of 30 minutes/day   Reports rarely drinks caffeine   Denies adding salt to her food. Reports limiting  salt and sodium in her diet  Reports she had a sleep study in the past (2016), but that procedure did not go well/unsure of result  Assessment/Plan: - Currently uncontrolled - Reviewed appropriate administration of medication regimen - Counseled on long term microvascular and macrovascular complications of uncontrolled hypertension - Reviewed appropriate home BP monitoring technique (avoid caffeine, smoking, and exercise for 30 minutes before checking, rest for at least 5 minutes before taking BP, sit with feet flat on the floor and back against a hard surface,  uncross legs, and rest arm on flat surface) - Discussed dietary modifications, such as reduced salt intake, focus on whole grains, vegetables, lean proteins - Discussed goal of 150 minutes of moderate intensity physical activity weekly - Encourage patient to call office to schedule her follow up appointment with PCP - Reviewed to check blood pressure, document, and bring this record, along with her blood pressure monitor to next appointment with PCP - Will collaborate with PCP   Follow Up Plan: CM Pharmacist will outreach to patient by telephone again within the next 60 days  Wallace Cullens, PharmD, [facility] [phone]

## 2021-06-09 NOTE — Patient Instructions (Signed)
Check your blood pressure once daily, and any time you have concerning symptoms like headache, chest pain, dizziness, shortness of breath, or vision changes.   Our goal is less than 130/80.  To appropriately check your blood pressure, make sure you do the following:  1) Avoid caffeine, exercise, or tobacco products for 30 minutes before checking. Empty your bladder. 2) Sit with your back supported in a flat-backed chair. Rest your arm on something flat (arm of the chair, table, etc). 3) Sit still with your feet flat on the floor, resting, for at least 5 minutes.  4) Check your blood pressure. Take 1-2 readings.  5) Write down these readings and bring with you to any provider appointments.  Bring your home blood pressure machine with you to a provider's office for accuracy comparison at least once a year.   Make sure you take your blood pressure medications before you come to any office visit, even if you were asked to fast for labs.  Wallace Cullens, PharmD, [place] [phone]

## 2021-06-10 ENCOUNTER — Other Ambulatory Visit: Payer: Self-pay | Admitting: Internal Medicine

## 2021-06-15 NOTE — Telephone Encounter (Signed)
Rx 03/08/21 #90 1RF- too soon Requested Prescriptions  Pending Prescriptions Disp Refills  . pravastatin (PRAVACHOL) 40 MG tablet [Pharmacy Med Name: Pravastatin Sodium 40 MG Oral Tablet] 100 tablet 2    Sig: TAKE 1 TABLET BY MOUTH DAILY     Cardiovascular:  Antilipid - Statins Failed - 06/10/2021 10:31 PM      Failed - Lipid Panel in normal range within the last 12 months    Cholesterol, Total  Date Value Ref Range Status  10/17/2014 179 100 - 199 mg/dL Final   Cholesterol  Date Value Ref Range Status  01/12/2021 173 <200 mg/dL Final   LDL Cholesterol (Calc)  Date Value Ref Range Status  01/12/2021 86 mg/dL (calc) Final    Comment:    Reference range: <100 . Desirable range <100 mg/dL for primary prevention;   <70 mg/dL for patients with CHD or diabetic patients  with > or = 2 CHD risk factors. Marland Kitchen LDL-C is now calculated using the Martin-Hopkins  calculation, which is a validated novel method providing  better accuracy than the Friedewald equation in the  estimation of LDL-C.  Cresenciano Genre et al. Annamaria Helling. 3244;010(27): 2061-2068  (http://education.[employer].com/faq/FAQ164)    Direct LDL  Date Value Ref Range Status  09/05/2013 169.6 mg/dL Final    Comment:    Optimal:  <100 mg/dLNear or Above Optimal:  100-129 mg/dLBorderline High:  130-159 mg/dLHigh:  160-189 mg/dLVery High:  >190 mg/dL   HDL  Date Value Ref Range Status  01/12/2021 62 > OR = 50 mg/dL Final  10/17/2014 59 >39 mg/dL Final    Comment:    According to ATP-III Guidelines, HDL-C >59 mg/dL is considered a negative risk factor for CHD.    Triglycerides  Date Value Ref Range Status  01/12/2021 148 <150 mg/dL Final         Passed - Patient is not pregnant      Passed - Valid encounter within last 12 months    Recent Outpatient Visits          6 days ago Primary hypertension   [place], Virl Diamond, RPH-CPP   2 weeks ago Primary hypertension   [place], Virl Diamond, RPH-CPP   1 month ago Primary hypertension   [place], Virl Diamond, RPH-CPP   5 months ago Encounter for general adult medical examination with abnormal findings   [facility], Coralie Keens, NP   9 months ago Herpes zoster with other complication   [facility], Coralie Keens, NP      Future Appointments            In 2 days Garnette Gunner, Coralie Keens, NP [facility], [facility]

## 2021-06-17 ENCOUNTER — Ambulatory Visit (INDEPENDENT_AMBULATORY_CARE_PROVIDER_SITE_OTHER): Payer: Medicare Other | Admitting: Internal Medicine

## 2021-06-17 ENCOUNTER — Encounter: Payer: Self-pay | Admitting: Internal Medicine

## 2021-06-17 VITALS — BP 172/96 | HR 84 | Temp 97.7°F | Wt 205.0 lb

## 2021-06-17 DIAGNOSIS — E1165 Type 2 diabetes mellitus with hyperglycemia: Secondary | ICD-10-CM

## 2021-06-17 DIAGNOSIS — M79671 Pain in right foot: Secondary | ICD-10-CM

## 2021-06-17 DIAGNOSIS — N1831 Chronic kidney disease, stage 3a: Secondary | ICD-10-CM | POA: Diagnosis not present

## 2021-06-17 DIAGNOSIS — Z853 Personal history of malignant neoplasm of breast: Secondary | ICD-10-CM | POA: Diagnosis not present

## 2021-06-17 DIAGNOSIS — I700 Atherosclerosis of aorta: Secondary | ICD-10-CM

## 2021-06-17 DIAGNOSIS — I10 Essential (primary) hypertension: Secondary | ICD-10-CM

## 2021-06-17 DIAGNOSIS — E782 Mixed hyperlipidemia: Secondary | ICD-10-CM

## 2021-06-17 DIAGNOSIS — K219 Gastro-esophageal reflux disease without esophagitis: Secondary | ICD-10-CM | POA: Diagnosis not present

## 2021-06-17 DIAGNOSIS — Z6834 Body mass index (BMI) 34.0-34.9, adult: Secondary | ICD-10-CM

## 2021-06-17 DIAGNOSIS — E6609 Other obesity due to excess calories: Secondary | ICD-10-CM

## 2021-06-17 DIAGNOSIS — G44011 Episodic cluster headache, intractable: Secondary | ICD-10-CM | POA: Diagnosis not present

## 2021-06-17 DIAGNOSIS — F450 Somatization disorder: Secondary | ICD-10-CM

## 2021-06-17 DIAGNOSIS — F32A Depression, unspecified: Secondary | ICD-10-CM

## 2021-06-17 DIAGNOSIS — G8929 Other chronic pain: Secondary | ICD-10-CM | POA: Insufficient documentation

## 2021-06-17 MED ORDER — AMLODIPINE BESYLATE 10 MG PO TABS: 10.0000 mg | ORAL_TABLET | Freq: Every day | ORAL | 1 refills | Status: DC

## 2021-06-17 MED ORDER — ASPIRIN 81 MG PO TBEC: 81.0000 mg | DELAYED_RELEASE_TABLET | Freq: Every day | ORAL | 12 refills | Status: DC

## 2021-06-17 NOTE — Assessment & Plan Note (Signed)
Persistent but stable off meds Support offered We will monitor

## 2021-06-17 NOTE — Assessment & Plan Note (Signed)
Encourage weight loss as this can help reduce reflux symptoms Okay to take Tums OTC as needed

## 2021-06-17 NOTE — Assessment & Plan Note (Signed)
Bmet today Continue Irbesartan for renal protection She will continue to follow with nephrology

## 2021-06-17 NOTE — Assessment & Plan Note (Signed)
Lipid profile reviewed Encouraged her to consume a low-fat diet Continue Pravastatin We will have her start baby Aspirin daily

## 2021-06-17 NOTE — Progress Notes (Signed)
Subjective:    Patient ID: Carrie Singh, female    DOB: Apr 18, 1955, 66 y.o.   MRN: [id number]  HPI  Patient presents to clinic today for follow-up of chronic conditions.  History of Right Breast Cancer: Status post lumpectomy with subsequent mastectomy, chemoradiation.  She is taking Letrozole as prescribed.  She follows with oncology.  Depression: Chronic dysthymia.  She is not currently taking any medications for this.  She is not seeing a therapist.  She denies anxiety, SI/HI.  DM2: Her last A1c was 5.9%, 03/2021.  Her sugars range 99-121.  She is taking Metformin as prescribed.  She checks her feet routinely.  Her last eye exam was 11/2020.  She follows with endocrinology.  GERD: She is not sure what triggers this.  She is not currently taking any medications for this.  There is no upper GI on file.  HTN: Her BP today is 172/96.  She is taking Hydralazine, Amlodipine, Carvedilol and Irbesartan as prescribed.  ECG from 02/2021 reviewed.  HLD with Aortic  Atherosclerosis: Her last LDL was 93, triglycerides 79, 03/2021.  She denies myalgias on Pravastatin.  She tries to consume a low-fat diet.  Migraines: This has not been an issue recently.  She takes Goody's powders as needed with good relief of symptoms.  She does not follow with neurology.  CKD 3: Her last creatinine was 1.1, GFR 60, 03/2021.  She is taking Irbesartan as prescribed.  She follows with nephrology.  Review of Systems     Past Medical History:  Diagnosis Date   Breast cancer ([place]) 01/2021   left breast IDC   Breast cancer, right breast ([place]) 12/2007   a. s/p chemo, radiation, and lumpectomy with negative sentinel lymph node biopsy    Complication of anesthesia    woke up during colonoscopy   Depression    Diabetes mellitus without complication (HCC)    GERD (gastroesophageal reflux disease)    Hyperlipidemia    Hypertension    resistant, negative renal Dopplers and negative CT angiogram   Migraines     Obesity     Current Outpatient Medications  Medication Sig Dispense Refill   amLODipine (NORVASC) 2.5 MG tablet Take 2.5 mg by mouth daily.     Biotin w/ Vitamins C & E (HAIR SKIN & NAILS GUMMIES PO) Take by mouth.     calcium carbonate (OSCAL) 1500 (600 Ca) MG TABS tablet Take by mouth 2 (two) times daily with a meal.     carvedilol (COREG) 12.5 MG tablet Take 12.5 mg by mouth in the morning and at bedtime.     cholecalciferol (VITAMIN D3) 25 MCG (1000 UNIT) tablet Take 2,000 Units by mouth daily.     hydrALAZINE (APRESOLINE) 100 MG tablet TAKE 1 TABLET BY MOUTH 3  TIMES DAILY 270 tablet 1   irbesartan (AVAPRO) 300 MG tablet TAKE 1 TABLET BY MOUTH  DAILY 90 tablet 1   Lancets (ONETOUCH DELICA PLUS [id number]) MISC CHECK BLOOD SUGAR TWICE  DAILY 200 each 3   letrozole (FEMARA) 2.5 MG tablet Take 1 tablet (2.5 mg total) by mouth daily. 90 tablet 3   metFORMIN (GLUCOPHAGE) 1000 MG tablet Take 1,000 mg by mouth 2 (two) times daily with a meal.     Multiple Vitamins-Minerals (ONE-A-DAY WOMENS VITACRAVES) CHEW Chew 1 tablet by mouth daily.      ONETOUCH ULTRA test strip USE TWICE DAILY AS DIRECTED 200 strip 3   pravastatin (PRAVACHOL) 40 MG tablet TAKE 1 TABLET BY  MOUTH DAILY 90 tablet 1   repaglinide (PRANDIN) 0.5 MG tablet Take 0.5 mg by mouth 3 (three) times daily before meals.     SHINGRIX injection Inject 0.5 mL into muscle for shingles vaccine. Repeat dose in 2-6 months. (Patient not taking: Reported on 04/16/2021) 0.5 mL 1   No current facility-administered medications for this visit.    No Known Allergies  Family History  Problem Relation Age of Onset   Stroke Mother    Colon cancer Father    Heart disease Father    Asthma Father    Cancer Father        colon   Cancer Sister        lung   Colon cancer Brother    Cancer Brother        colon   Diabetes Brother    Breast cancer Other 3   Esophageal cancer Neg Hx    Rectal cancer Neg Hx    Stomach cancer Neg Hx     Social  History   Socioeconomic History   Marital status: Married    Spouse name: [name]   Number of children: Not on file   Years of education: 12+   Highest education level: Not on file  Occupational History   Occupation: [employer]  Tobacco Use   Smoking status: Never   Smokeless tobacco: Never  Vaping Use   Vaping Use: Never used  Substance and Sexual Activity   Alcohol use: No    Alcohol/week: 0.0 standard drinks   Drug use: No   Sexual activity: Yes  Other Topics Concern   Not on file  Social History Narrative   Lives at home with husband.   Caffeine use: 1-2 drinks per month         Epworth Sleepiness Scale = 15 (as of 01/01/15)   Social Determinants of Health   Financial Resource Strain: Not on file  Food Insecurity: Not on file  Transportation Needs: Not on file  Physical Activity: Not on file  Stress: Not on file  Social Connections: Not on file  Intimate Partner Violence: Not on file     Constitutional: Patient reports intermittent headaches.  Denies fever, malaise, fatigue, or abrupt weight changes.  HEENT: Denies eye pain, eye redness, ear pain, ringing in the ears, wax buildup, runny nose, nasal congestion, bloody nose, or sore throat. Respiratory: Denies difficulty breathing, shortness of breath, cough or sputum production.   Cardiovascular: Denies chest pain, chest tightness, palpitations or swelling in the hands or feet.  Gastrointestinal: Denies abdominal pain, bloating, constipation, diarrhea or blood in the stool.  GU: Denies urgency, frequency, pain with urination, burning sensation, blood in urine, odor or discharge. Musculoskeletal: Pt reports right foot pain. Denies decrease in range of motion, difficulty with gait, muscle pain or joint swelling.  Skin: Denies redness, rashes, lesions or ulcercations.  Neurological: Denies dizziness, difficulty with memory, difficulty with speech or problems with balance and coordination.  Psych: Patient has a history of  depression.  Denies anxiety, SI/HI.  No other specific complaints in a complete review of systems (except as listed in HPI above).  Objective:   Physical Exam  BP (!) 172/96 (BP Location: Right Arm, Patient Position: Sitting, Cuff Size: Large)   Pulse 84   Temp 97.7 F (36.5 C) (Temporal)   Wt 205 lb (93 kg)   SpO2 99%   BMI 34.11 kg/m   Wt Readings from Last 3 Encounters:  04/16/21 211 lb (95.7 kg)  04/12/21  211 lb (95.7 kg)  03/18/21 208 lb 1.6 oz (94.4 kg)    General: Appears her stated age, obese, in NAD. Skin: Warm, dry and intact. No ulcerations noted. HEENT: Head: normal shape and size; Eyes: sclera white,  PERRLA and EOMs intact; Neck:  Neck supple, trachea midline. No masses, lumps or thyromegaly present.  Cardiovascular: Normal rate and rhythm. S1,S2 noted.  No murmur, rubs or gallops noted. No JVD or BLE edema. No carotid bruits noted. Pulmonary/Chest: Normal effort and positive vesicular breath sounds. No respiratory distress. No wheezes, rales or ronchi noted.  Abdomen: Soft and nontender. Normal bowel sounds.  Musculoskeletal: No difficulty with gait.  Neurological: Alert and oriented.  Psychiatric: Mood and affect normal. Behavior is normal. Judgment and thought content normal.   BMET    Component Value Date/Time   NA 138 03/05/2021 0927   NA 146 (H) 01/20/2015 1457   NA 143 10/12/2013 1510   K 3.6 03/05/2021 0927   K 3.9 10/12/2013 1510   CL 104 03/05/2021 0927   CL 109 (H) 10/12/2013 1510   CO2 26 03/05/2021 0927   CO2 29 10/12/2013 1510   GLUCOSE 119 (H) 03/05/2021 0927   GLUCOSE 92 10/12/2013 1510   BUN 14 03/05/2021 0927   BUN 17 01/20/2015 1457   BUN 16 10/12/2013 1510   CREATININE 1.07 (H) 03/05/2021 0927   CREATININE 1.31 (H) 01/12/2021 1346   CALCIUM 9.0 03/05/2021 0927   CALCIUM 9.4 10/12/2013 1510   GFRNONAA 57 (L) 03/05/2021 0927   GFRNONAA 56 (L) 10/12/2013 1510   GFRAA >60 01/24/2018 2159   GFRAA >60 10/12/2013 1510    Lipid  Panel     Component Value Date/Time   CHOL 173 01/12/2021 1346   CHOL 179 10/17/2014 1527   TRIG 148 01/12/2021 1346   HDL 62 01/12/2021 1346   HDL 59 10/17/2014 1527   CHOLHDL 2.8 01/12/2021 1346   VLDL 15.2 01/09/2020 0957   LDLCALC 86 01/12/2021 1346    CBC    Component Value Date/Time   WBC 3.9 01/12/2021 1346   RBC 4.18 01/12/2021 1346   HGB 12.5 01/12/2021 1346   HGB 13.5 10/12/2013 1510   HGB 12.8 08/13/2009 0946   HCT 38.4 01/12/2021 1346   HCT 40.5 10/12/2013 1510   HCT 36.8 08/13/2009 0946   PLT 250 01/12/2021 1346   PLT 278 10/12/2013 1510   PLT 271 08/13/2009 0946   MCV 91.9 01/12/2021 1346   MCV 87 10/12/2013 1510   MCV 87.0 08/13/2009 0946   MCH 29.9 01/12/2021 1346   MCHC 32.6 01/12/2021 1346   RDW 12.4 01/12/2021 1346   RDW 13.6 10/12/2013 1510   RDW 13.8 08/13/2009 0946   LYMPHSABS 2.1 01/24/2018 2159   LYMPHSABS 1.5 08/13/2009 0946   MONOABS 0.3 01/24/2018 2159   MONOABS 0.2 08/13/2009 0946   EOSABS 0.1 01/24/2018 2159   EOSABS 0.1 08/13/2009 0946   BASOSABS 0.1 01/24/2018 2159   BASOSABS 0.0 08/13/2009 0946    Hgb A1C Lab Results  Component Value Date   HGBA1C 5.6 01/12/2021           Assessment & Plan:    Webb Silversmith, NP

## 2021-06-17 NOTE — Assessment & Plan Note (Signed)
Too soon for A1c Urine microalbumin checked 03/2021 Encouraged her to consume a low-carb diet and exercise weight loss Continue Metformin Encourage routine eye exam Encourage routine foot exam

## 2021-06-17 NOTE — Assessment & Plan Note (Signed)
Intermittent Advised her to use Tylenol OTC

## 2021-06-17 NOTE — Assessment & Plan Note (Signed)
Encourage diet and exercise for weight loss 

## 2021-06-17 NOTE — Patient Instructions (Signed)

## 2021-06-17 NOTE — Assessment & Plan Note (Signed)
Not currently following podiatry Advised her to try Tylenol arthritis OTC as needed

## 2021-06-17 NOTE — Assessment & Plan Note (Signed)
Uncontrolled Continue Hydralazine, Carvedilol and Irbesartan Increase Amlodipine to 10 mg daily Reinforced DASH diet and exercise for weight loss  RTC in 2 weeks for BP check

## 2021-06-17 NOTE — Assessment & Plan Note (Signed)
In remission Continue Letrozole She will continue to follow with oncology

## 2021-06-18 LAB — BASIC METABOLIC PANEL WITH GFR
BUN/Creatinine Ratio: 15 (calc) (ref 6–22)
BUN: 17 mg/dL (ref 7–25)
CO2: 26 mmol/L (ref 20–32)
Calcium: 9.8 mg/dL (ref 8.6–10.4)
Chloride: 105 mmol/L (ref 98–110)
Creat: 1.12 mg/dL — ABNORMAL HIGH (ref 0.50–1.05)
Glucose, Bld: 171 mg/dL — ABNORMAL HIGH (ref 65–139)
Potassium: 4.2 mmol/L (ref 3.5–5.3)
Sodium: 142 mmol/L (ref 135–146)
eGFR: 54 mL/min/{1.73_m2} — ABNORMAL LOW (ref >=60)

## 2021-06-21 ENCOUNTER — Inpatient Hospital Stay: Payer: Medicare Other | Attending: Hematology and Oncology | Admitting: Adult Health

## 2021-06-21 ENCOUNTER — Encounter: Payer: Self-pay | Admitting: Adult Health

## 2021-06-21 ENCOUNTER — Telehealth: Payer: Self-pay | Admitting: *Deleted

## 2021-06-21 ENCOUNTER — Other Ambulatory Visit: Payer: Self-pay

## 2021-06-21 VITALS — BP 127/59 | HR 75 | Temp 97.9°F | Resp 16 | Ht 65.0 in | Wt 207.4 lb

## 2021-06-21 DIAGNOSIS — K219 Gastro-esophageal reflux disease without esophagitis: Secondary | ICD-10-CM | POA: Insufficient documentation

## 2021-06-21 DIAGNOSIS — Z833 Family history of diabetes mellitus: Secondary | ICD-10-CM | POA: Diagnosis not present

## 2021-06-21 DIAGNOSIS — Z801 Family history of malignant neoplasm of trachea, bronchus and lung: Secondary | ICD-10-CM | POA: Insufficient documentation

## 2021-06-21 DIAGNOSIS — Z800 Family history of malignant neoplasm of digestive organs: Secondary | ICD-10-CM | POA: Diagnosis not present

## 2021-06-21 DIAGNOSIS — Z13820 Encounter for screening for osteoporosis: Secondary | ICD-10-CM | POA: Diagnosis not present

## 2021-06-21 DIAGNOSIS — E119 Type 2 diabetes mellitus without complications: Secondary | ICD-10-CM | POA: Diagnosis not present

## 2021-06-21 DIAGNOSIS — E785 Hyperlipidemia, unspecified: Secondary | ICD-10-CM | POA: Diagnosis not present

## 2021-06-21 DIAGNOSIS — I10 Essential (primary) hypertension: Secondary | ICD-10-CM | POA: Insufficient documentation

## 2021-06-21 DIAGNOSIS — R5383 Other fatigue: Secondary | ICD-10-CM | POA: Insufficient documentation

## 2021-06-21 DIAGNOSIS — Z9221 Personal history of antineoplastic chemotherapy: Secondary | ICD-10-CM | POA: Diagnosis not present

## 2021-06-21 DIAGNOSIS — Z823 Family history of stroke: Secondary | ICD-10-CM | POA: Diagnosis not present

## 2021-06-21 DIAGNOSIS — Z9012 Acquired absence of left breast and nipple: Secondary | ICD-10-CM | POA: Diagnosis not present

## 2021-06-21 DIAGNOSIS — Z79899 Other long term (current) drug therapy: Secondary | ICD-10-CM | POA: Insufficient documentation

## 2021-06-21 DIAGNOSIS — Z170 Estrogen receptor positive status [ER+]: Secondary | ICD-10-CM | POA: Insufficient documentation

## 2021-06-21 DIAGNOSIS — Z923 Personal history of irradiation: Secondary | ICD-10-CM | POA: Insufficient documentation

## 2021-06-21 DIAGNOSIS — C50912 Malignant neoplasm of unspecified site of left female breast: Secondary | ICD-10-CM | POA: Diagnosis not present

## 2021-06-21 DIAGNOSIS — Z8249 Family history of ischemic heart disease and other diseases of the circulatory system: Secondary | ICD-10-CM | POA: Diagnosis not present

## 2021-06-21 DIAGNOSIS — C50511 Malignant neoplasm of lower-outer quadrant of right female breast: Secondary | ICD-10-CM | POA: Diagnosis present

## 2021-06-21 DIAGNOSIS — Z803 Family history of malignant neoplasm of breast: Secondary | ICD-10-CM | POA: Insufficient documentation

## 2021-06-21 DIAGNOSIS — Z79811 Long term (current) use of aromatase inhibitors: Secondary | ICD-10-CM | POA: Diagnosis not present

## 2021-06-21 DIAGNOSIS — Z853 Personal history of malignant neoplasm of breast: Secondary | ICD-10-CM | POA: Insufficient documentation

## 2021-06-21 DIAGNOSIS — E669 Obesity, unspecified: Secondary | ICD-10-CM | POA: Insufficient documentation

## 2021-06-21 NOTE — Telephone Encounter (Signed)
Patient calling for an appointment, experiencing a lot of right foot pain, requesting another injection, possibly as soon as today.she has not been seen since 11/22. Please schedule.

## 2021-06-21 NOTE — Progress Notes (Signed)
SURVIVORSHIP VISIT:    BRIEF ONCOLOGIC HISTORY:  Oncology History  History of breast cancer  12/18/2007 Initial Diagnosis   Right breast cancer treated with lumpectomy followed by radiation and 5 years of antiestrogen therapy with aromatase inhibitor    02/02/2016 Relapse/Recurrence   Right breast biopsy 6:00 position: IDC with DCIS grade 2, ER 100%, PR 90%, Ki-67 10%, HER-2 negative ratio 1.28, screening detected right breast lumps 0.5 cm and 0.3 cm, T1a N0 stage IA clinical stage   03/18/2016 Surgery   Right simple mastectomy: Grade 2 IDC with DCIS 1.5 cm, margins -0/3 lymph nodes, T1b N0 stage IA, ER 100%, PR 90%, Ki-67 10%, HER-2 negative ratio 1.28    03/18/2016 Oncotype testing   11/7%    04/2016 -  Anti-estrogen oral therapy   Letrozole 2.66m daily switch to exemestane 25 mg daily May 2018 switched to anastrozole 1 mg daily due to cost    03/10/2021 Surgery   Left Mastectomy : Grade 2 IDC 1.1 cm, 0/8 LN neg, ER 100%, PR: 0%, ki 67: 15%, HER 2 neg      04/2021 -  Anti-estrogen oral therapy   Letrozole daily   Malignant neoplasm of left breast in female, estrogen receptor positive ([facility]  02/18/2021 Initial Diagnosis   Malignant neoplasm of left breast in female, estrogen receptor positive ([facility]     INTERVAL HISTORY:  Carrie Singh review her survivorship care plan detailing her treatment course for breast cancer, as well as monitoring long-term side effects of that treatment, education regarding health maintenance, screening, and overall wellness and health promotion.     Overall, [name] feeling quite well.  She is taking letrozole daily and is tolerating this well other than fatigue on a scale of 8 out of 10.  She has a history of diabetes and hypertension and notes that she is very elated that her blood pressure is under better control at today's visit comparative to few days ago.  REVIEW OF SYSTEMS:  Review of Systems  Constitutional:  Positive for fatigue.  Negative for appetite change, chills, fever and unexpected weight change.  HENT:   Negative for hearing loss, lump/mass and trouble swallowing.   Eyes:  Negative for eye problems and icterus.  Respiratory:  Negative for chest tightness, cough and shortness of breath.   Cardiovascular:  Negative for chest pain, leg swelling and palpitations.  Gastrointestinal:  Negative for abdominal distention, abdominal pain, constipation, diarrhea, nausea and vomiting.  Endocrine: Negative for hot flashes.  Genitourinary:  Negative for difficulty urinating.   Musculoskeletal:  Negative for arthralgias.  Skin:  Negative for itching and rash.  Neurological:  Negative for dizziness, extremity weakness, headaches and numbness.  Hematological:  Negative for adenopathy. Does not bruise/bleed easily.  Psychiatric/Behavioral:  Negative for depression. The patient is not nervous/anxious.   Breast: Denies any new nodularity, masses, tenderness, nipple changes, or nipple discharge.      ONCOLOGY TREATMENT TEAM:  1. Sur[facility]  [facility] 2. Medical Oncologist: Dr. GLindi Adie     PAST MEDICAL/SURGICAL HISTORY:  Past Medical History:  Diagnosis Date   [place] 01[place]   left breast IDC   [place] breast ([facility] 12[place]   a. s/p chemo, radiation, and lumpectomy with negative sentinel lymph node biopsy    Complication of anesthesia    woke up during colonoscopy   Depression    Diabetes mellitus without complication ([facility]    GERD (gastroesophageal reflux disease)  Hyperlipidemia    Hypertension    resistant, negative renal Dopplers and negative CT angiogram   Migraines    Obesity    Past Surgical History:  Procedure Laterality Date   ABDOMINAL HYSTERECTOMY     BREAST LUMPECTOMY     CYST EXCISION     FOOT SURGERY     MASTECTOMY Right    MASTECTOMY W/ SENTINEL NODE BIOPSY Right 03/18/2016   Procedure: RIGHT MASTECTOMY WITH RIGHT AXILLARY SENTINEL LYMPH  NODE BIOPSY;  Surgeon: Alphonsa Overall, MD;  Location: [facility];  Service: General;  Laterality: Right;   MASTECTOMY W/ SENTINEL NODE BIOPSY Left 03/10/2021   Procedure: LEFT MASTECTOMY WITH LEFT SENTINEL LYMPH NODE BIOPSY;  Surgeon: Erroll Luna, MD;  Location: [facility];  Service: General;  Laterality: Left;   ovary tumor     vocal cord nodules       ALLERGIES:  No Known Allergies   CURRENT MEDICATIONS:  Outpatient Encounter Medications as of 06/21/2021  Medication Sig   amLODipine (NORVASC) 10 MG tablet Take 1 tablet (10 mg total) by mouth daily.   aspirin EC 81 MG tablet Take 1 tablet (81 mg total) by mouth daily. Swallow whole.   Biotin w/ Vitamins C & E (HAIR SKIN & NAILS GUMMIES PO) Take by mouth.   calcium carbonate (OSCAL) 1500 (600 Ca) MG TABS tablet Take by mouth 2 (two) times daily with a meal.   carvedilol (COREG) 12.5 MG tablet Take 12.5 mg by mouth in the morning and at bedtime.   cholecalciferol (VITAMIN D3) 25 MCG (1000 UNIT) tablet Take 2,000 Units by mouth daily.   hydrALAZINE (APRESOLINE) 100 MG tablet TAKE 1 TABLET BY MOUTH 3  TIMES DAILY   irbesartan (AVAPRO) 300 MG tablet TAKE 1 TABLET BY MOUTH  DAILY   Lancets (ONETOUCH DELICA PLUS [id number]) MISC CHECK BLOOD SUGAR TWICE  DAILY   letrozole (FEMARA) 2.5 MG tablet Take 1 tablet (2.5 mg total) by mouth daily.   metFORMIN (GLUCOPHAGE) 1000 MG tablet Take 1,000 mg by mouth 2 (two) times daily with a meal.   Multiple Vitamins-Minerals (ONE-A-DAY WOMENS VITACRAVES) CHEW Chew 1 tablet by mouth daily.    ONETOUCH ULTRA test strip USE TWICE DAILY AS DIRECTED   pravastatin (PRAVACHOL) 40 MG tablet TAKE 1 TABLET BY MOUTH DAILY   repaglinide (PRANDIN) 0.5 MG tablet Take 0.5 mg by mouth 3 (three) times daily before meals.   No facility-administered encounter medications on file as of 06/21/2021.     ONCOLOGIC FAMILY HISTORY:  Family History  Problem Relation Age of Onset   Stroke Mother    Colon cancer Father     Heart disease Father    Asthma Father    Cancer Father        colon   Cancer Sister        lung   Colon cancer Brother    Cancer Brother        colon   Diabetes Brother    Breast cancer Other 43   Esophageal cancer Neg Hx    Rectal cancer Neg Hx    Stomach cancer Neg Hx      GENETIC COUNSELING/TESTING: Not at this time  SOCIAL HISTORY:  Social History   Socioeconomic History   Marital status: Married    Spouse name: [name]   Number of children: Not on file   Years of education: 12+   Highest education level: Not on file  Occupational History   Occupation: [employer]  Tobacco  Use   Smoking status: Never   Smokeless tobacco: Never  Vaping Use   Vaping Use: Never used  Substance and Sexual Activity   Alcohol use: No    Alcohol/week: 0.0 standard drinks   Drug use: No   Sexual activity: Yes  Other Topics Concern   Not on file  Social History Narrative   Lives at home with husband.   Caffeine use: 1-2 drinks per month         Epworth Sleepiness Scale = 15 (as of 01/01/15)   Social Determinants of Health   Financial Resource Strain: Not on file  Food Insecurity: Not on file  Transportation Needs: Not on file  Physical Activity: Not on file  Stress: Not on file  Social Connections: Not on file  Intimate Partner Violence: Not on file     OBSERVATIONS/OBJECTIVE:  BP (!) 127/59 (BP Location: Left Arm, Patient Position: Sitting)   Pulse 75   Temp 97.9 F (36.6 C) (Temporal)   Resp 16   Ht _0  (1.651 m)   Wt 207 lb 6.4 oz (94.1 kg)   SpO2 100%   BMI 34.51 kg/m  GENERAL: Patient is a well appearing female in no acute distress HEENT:  Sclerae anicteric.  Oropharynx clear and moist. No ulcerations or evidence of oropharyngeal candidiasis. Neck is supple.  NODES:  No cervical, supraclavicular, or axillary lymphadenopathy palpated.  BREAST EXAM:  s/p bilateral mastectomies, no sign of local recurrence LUNGS:  Clear to auscultation bilaterally.  No wheezes or  rhonchi. HEART:  Regular rate and rhythm. No murmur appreciated. ABDOMEN:  Soft, nontender.  Positive, normoactive bowel sounds. No organomegaly palpated. MSK:  No focal spinal tenderness to palpation. Full range of motion bilaterally in the upper extremities. EXTREMITIES:  No peripheral edema.   SKIN:  Clear with no obvious rashes or skin changes. No nail dyscrasia. NEURO:  Nonfocal. Well oriented.  Appropriate affect.  LABORATORY DATA:  None for this visit.  DIAGNOSTIC IMAGING:  None for this visit.      ASSESSMENT AND PLAN:  Carrie Singh is a pleasant 66 y.o. female with Stage IA left breast invasive ductal carcinoma, ER+/PR-/HER2-, diagnosed in 02/2021, treated with mastectomy, and anti-estrogen therapy with Letrozole beginning in 04/2021 (patient has history of right-sided breast cancer).  She presents to the Survivorship Clinic for our initial meeting and routine follow-up post-completion of treatment for breast cancer.    1. Stage IA left breast cancer:  Carrie Singh is continuing to recover from definitive treatment for breast cancer. She will follow-up with her medical oncologist, Dr. Lindi Adie in 6 months with history and physical exam per surveillance protocol.  She will continue her anti-estrogen therapy with letrozole. Thus far, she is tolerating the letrozole well, however is experiencing fatigue with this.  I recommended that she consider changing the time of day that she takes the letrozole and that may help ameliorate her symptoms.  Today, a comprehensive survivorship care plan and treatment summary was reviewed with the patient today detailing her breast cancer diagnosis, treatment course, potential late/long-term effects of treatment, appropriate follow-up care with recommendations for the future, and patient education resources.  A copy of this summary, along with a letter will be sent to the patient's primary care provider via mail/fax/In Basket message after today's visit.    2.  Bone health:  Given Carrie Singh's age/history of breast cancer and her current treatment regimen including anti-estrogen therapy with letrozole, she is at risk for bone demineralization.  Her  last DEXA scan was December 31, 2020 and showed normal bone density.  I repeat is recommended to be completed in December 2024 She was given education on specific activities to promote bone health.  3. Cancer screening:  Due to Carrie Singh's history and her age, she should receive screening for skin cancers, colon cancer, and gynecologic cancers.  The information and recommendations are listed on the patient's comprehensive care plan/treatment summary and were reviewed in detail with the patient.    4. Health maintenance and wellness promotion: Carrie Singh was encouraged to consume 5-7 servings of fruits and vegetables per day. We reviewed the "Nutrition Rainbow" handout.  She was also encouraged to engage in moderate to vigorous exercise for 30 minutes per day most days of the week. We discussed the [employer] fitness program, which is designed for cancer survivors to help them become more physically fit after cancer treatments.  She was instructed to limit her alcohol consumption and continue to abstain from tobacco use.     5. Support services/counseling: It is not uncommon for this period of the patient's cancer care trajectory to be one of many emotions and stressors.  She was given information regarding our available services and encouraged to contact me with any questions or for help enrolling in any of our support group/programs.    Follow up instructions:    -Return to cancer center in 6 months for follow-up with Dr. Lindi Adie -Bone density testing in December 2024 -Follow up with surgery in 1 year -She is welcome to return back to the Survivorship Clinic at any time; no additional follow-up needed at this time.  -Consider referral back to survivorship as a long-term survivor for continued  surveillance  The patient was provided an opportunity to ask questions and all were answered. The patient agreed with the plan and demonstrated an understanding of the instructions.   Total encounter time:45 minutes*in face-to-face visit time, chart review, lab review, care coordination, order entry, and documentation of the encounter time.  Wilber Bihari, NP 06/21/21 8:56 AM Medical Oncology and Hematology [facility] [place], [place] [address] Tel. [phone]    Fax. [phone]  *Total Encounter Time as defined by the Centers for Medicare and Medicaid Services includes, in addition to the face-to-face time of a patient visit (documented in the note above) non-face-to-face time: obtaining and reviewing outside history, ordering and reviewing medications, tests or procedures, care coordination (communications with other health care professionals or caregivers) and documentation in the medical record.

## 2021-06-21 NOTE — Progress Notes (Incomplete)
Patient Care Team: Jearld Fenton, NP as PCP - General (Internal Medicine) Rockey Situ, Kathlene November, MD as Consulting Physician (Cardiology) Nicholas Lose, MD as Consulting Physician (Hematology and Oncology) Alphonsa Overall, MD as Consulting Physician (General Surgery) Causey, Charlestine Massed, NP as Nurse Practitioner (Hematology and Oncology) Mauro Kaufmann, RN as Oncology Nurse Navigator Rockwell Germany, RN as Oncology Nurse Navigator  DIAGNOSIS: No diagnosis found.  SUMMARY OF ONCOLOGIC HISTORY: Oncology History  History of breast cancer  12/18/2007 Initial Diagnosis   Right breast cancer treated with lumpectomy followed by radiation and 5 years of antiestrogen therapy with aromatase inhibitor    02/02/2016 Relapse/Recurrence   Right breast biopsy 6:00 position: IDC with DCIS grade 2, ER 100%, PR 90%, Ki-67 10%, HER-2 negative ratio 1.28, screening detected right breast lumps 0.5 cm and 0.3 cm, T1a N0 stage IA clinical stage   03/18/2016 Surgery   Right simple mastectomy: Grade 2 IDC with DCIS 1.5 cm, margins -0/3 lymph nodes, T1b N0 stage IA, ER 100%, PR 90%, Ki-67 10%, HER-2 negative ratio 1.28    03/18/2016 Oncotype testing   11/7%    04/2016 -  Anti-estrogen oral therapy   Letrozole 2.70m daily switch to exemestane 25 mg daily May 2018 switched to anastrozole 1 mg daily due to cost      CHIEF COMPLIANT: Follow-up of right breast cancer status postmastectomy    INTERVAL HISTORY: Carrie KERCHNERis a 66y.o. with above-mentioned history of right breast cancer treated with mastectomy followed by antiestrogen therapy with anastrozole. She presents to the clinic today for follow-up.   ALLERGIES:  has No Known Allergies.  MEDICATIONS:  Current Outpatient Medications  Medication Sig Dispense Refill   amLODipine (NORVASC) 10 MG tablet Take 1 tablet (10 mg total) by mouth daily. 90 tablet 1   aspirin EC 81 MG tablet Take 1 tablet (81 mg total) by mouth daily. Swallow whole. 30  tablet 12   Biotin w/ Vitamins C & E (HAIR SKIN & NAILS GUMMIES PO) Take by mouth.     calcium carbonate (OSCAL) 1500 (600 Ca) MG TABS tablet Take by mouth 2 (two) times daily with a meal.     carvedilol (COREG) 12.5 MG tablet Take 12.5 mg by mouth in the morning and at bedtime.     cholecalciferol (VITAMIN D3) 25 MCG (1000 UNIT) tablet Take 2,000 Units by mouth daily.     hydrALAZINE (APRESOLINE) 100 MG tablet TAKE 1 TABLET BY MOUTH 3  TIMES DAILY 270 tablet 1   irbesartan (AVAPRO) 300 MG tablet TAKE 1 TABLET BY MOUTH  DAILY 90 tablet 1   Lancets (ONETOUCH DELICA PLUS [id number] MISC CHECK BLOOD SUGAR TWICE  DAILY 200 each 3   letrozole (FEMARA) 2.5 MG tablet Take 1 tablet (2.5 mg total) by mouth daily. 90 tablet 3   metFORMIN (GLUCOPHAGE) 1000 MG tablet Take 1,000 mg by mouth 2 (two) times daily with a meal.     Multiple Vitamins-Minerals (ONE-A-DAY WOMENS VITACRAVES) CHEW Chew 1 tablet by mouth daily.      ONETOUCH ULTRA test strip USE TWICE DAILY AS DIRECTED 200 strip 3   pravastatin (PRAVACHOL) 40 MG tablet TAKE 1 TABLET BY MOUTH DAILY 90 tablet 1   repaglinide (PRANDIN) 0.5 MG tablet Take 0.5 mg by mouth 3 (three) times daily before meals.     No current facility-administered medications for this visit.    PHYSICAL EXAMINATION: ECOG PERFORMANCE STATUS: {CHL ONC ECOG PS:[phone]}  There were no vitals filed  for this visit. There were no vitals filed for this visit.  BREAST:*** No palpable masses or nodules in either right or left breasts. No palpable axillary supraclavicular or infraclavicular adenopathy no breast tenderness or nipple discharge. (exam performed in the presence of a chaperone)  LABORATORY DATA:  I have reviewed the data as listed    Latest Ref Rng & Units 06/17/2021    9:28 AM 03/05/2021    9:27 AM 01/12/2021    1:46 PM  CMP  Glucose 65 - 139 mg/dL 171   119   178    BUN 7 - 25 mg/dL 17   14   20     Creatinine 0.50 - 1.05 mg/dL 1.12   1.07   1.31    Sodium 135  - 146 mmol/L 142   138   143    Potassium 3.5 - 5.3 mmol/L 4.2   3.6   4.2    Chloride 98 - 110 mmol/L 105   104   104    CO2 20 - 32 mmol/L 26   26   33    Calcium 8.6 - 10.4 mg/dL 9.8   9.0   9.9    Total Protein 6.1 - 8.1 g/dL   6.6    Total Bilirubin 0.2 - 1.2 mg/dL   0.9    AST 10 - 35 U/L   12    ALT 6 - 29 U/L   11      Lab Results  Component Value Date   WBC 3.9 01/12/2021   HGB 12.5 01/12/2021   HCT 38.4 01/12/2021   MCV 91.9 01/12/2021   PLT 250 01/12/2021   NEUTROABS 2.7 01/24/2018    ASSESSMENT & PLAN:  No problem-specific Assessment & Plan notes found for this encounter.    No orders of the defined types were placed in this encounter.  The patient has a good understanding of the overall plan. she agrees with it. she will call with any problems that may develop before the next visit here. Total time spent: 30 mins including face to face time and time spent for planning, charting and co-ordination of care   Suzzette Righter, [place] 06/21/21    I Gardiner Coins am scribing for Dr. Lindi Adie  ***

## 2021-06-22 ENCOUNTER — Ambulatory Visit: Payer: Medicare Other | Admitting: Podiatry

## 2021-06-22 DIAGNOSIS — M7751 Other enthesopathy of right foot: Secondary | ICD-10-CM

## 2021-06-22 DIAGNOSIS — M205X1 Other deformities of toe(s) (acquired), right foot: Secondary | ICD-10-CM | POA: Diagnosis not present

## 2021-06-23 ENCOUNTER — Ambulatory Visit: Payer: Medicare Other | Admitting: Podiatry

## 2021-06-23 NOTE — Progress Notes (Signed)
Subjective:  Patient ID: Carrie Singh, female    DOB: 02-09-1955,  MRN: [id number]  Chief Complaint  Patient presents with   Injections    66 y.o. female presents with the above complaint.  Patient presents with complaint of right first metatarsophalangeal joint pain.  Patient states that she normally gets injection by Dr. Paulla Dolly.  She states it been hurting and is causing her a lot of pain.  She wanted get it evaluated she wanted make sure things okay.  She is going out of town and wants to make sure her foot is able to function.  She has not seen anyone else prior to seeing me in a while.  She has been wearing regular shoes.  She has not made any shoe gear modification.   Review of Systems: Negative except as noted in the HPI. Denies N/V/F/Ch.  Past Medical History:  Diagnosis Date   Breast cancer ([place]) 01/2021   left breast IDC   Breast cancer, right breast ([place]) 12/2007   a. s/p chemo, radiation, and lumpectomy with negative sentinel lymph node biopsy    Complication of anesthesia    woke up during colonoscopy   Depression    Diabetes mellitus without complication (HCC)    GERD (gastroesophageal reflux disease)    Hyperlipidemia    Hypertension    resistant, negative renal Dopplers and negative CT angiogram   Migraines    Obesity     Current Outpatient Medications:    amLODipine (NORVASC) 10 MG tablet, Take 1 tablet (10 mg total) by mouth daily., Disp: 90 tablet, Rfl: 1   Biotin w/ Vitamins C & E (HAIR SKIN & NAILS GUMMIES PO), Take by mouth., Disp: , Rfl:    calcium carbonate (OSCAL) 1500 (600 Ca) MG TABS tablet, Take by mouth 2 (two) times daily with a meal., Disp: , Rfl:    carvedilol (COREG) 12.5 MG tablet, Take 12.5 mg by mouth in the morning and at bedtime., Disp: , Rfl:    cholecalciferol (VITAMIN D3) 25 MCG (1000 UNIT) tablet, Take 2,000 Units by mouth daily., Disp: , Rfl:    hydrALAZINE (APRESOLINE) 100 MG tablet, TAKE 1 TABLET BY MOUTH 3  TIMES DAILY, Disp: 270  tablet, Rfl: 1   irbesartan (AVAPRO) 300 MG tablet, TAKE 1 TABLET BY MOUTH  DAILY, Disp: 90 tablet, Rfl: 1   Lancets (ONETOUCH DELICA PLUS [id number]) MISC, CHECK BLOOD SUGAR TWICE  DAILY, Disp: 200 each, Rfl: 3   letrozole (FEMARA) 2.5 MG tablet, Take 1 tablet (2.5 mg total) by mouth daily., Disp: 90 tablet, Rfl: 3   metFORMIN (GLUCOPHAGE) 1000 MG tablet, Take 1,000 mg by mouth 2 (two) times daily with a meal., Disp: , Rfl:    Multiple Vitamins-Minerals (ONE-A-DAY WOMENS VITACRAVES) CHEW, Chew 1 tablet by mouth daily. , Disp: , Rfl:    ONETOUCH ULTRA test strip, USE TWICE DAILY AS DIRECTED, Disp: 200 strip, Rfl: 3   pravastatin (PRAVACHOL) 40 MG tablet, TAKE 1 TABLET BY MOUTH DAILY, Disp: 90 tablet, Rfl: 1   repaglinide (PRANDIN) 0.5 MG tablet, Take 0.5 mg by mouth 3 (three) times daily before meals., Disp: , Rfl:   Social History   Tobacco Use  Smoking Status Never  Smokeless Tobacco Never    No Known Allergies Objective:  There were no vitals filed for this visit. There is no height or weight on file to calculate BMI. Constitutional Well developed. Well nourished.  Vascular Dorsalis pedis pulses palpable bilaterally. Posterior tibial pulses palpable bilaterally. Capillary  refill normal to all digits.  No cyanosis or clubbing noted. Pedal hair growth normal.  Neurologic Normal speech. Oriented to person, place, and time. Epicritic sensation to light touch grossly present bilaterally.  Dermatologic Nails well groomed and normal in appearance. No open wounds. No skin lesions.  Orthopedic: Limited range of motion noted to the right first metatarsal phalangeal joint findings consistent with hallux limitus.  Pain at the end range of motion of the first MPJ joint.  No deep intra-articular first MPJ pain noted.  No crepitus noted.  No extensor or flexor tendinitis noted.  No pain at the sesamoidal complex.   Radiographs: Previous hardware noted.  Bunionectomy correction appears to be  within normal limits metatarsal parabola is within normal limits.  Mild midfoot arthritis noted.  No other bony abnormalities identified Assessment:   1. Capsulitis of metatarsophalangeal (MTP) joint of right foot   2. Hallux limitus, right    Plan:  Patient was evaluated and treated and all questions answered.  Right first metatarsophalangeal joint capsulitis with underlying hallux limitus here -All questions and concerns were discussed with the patient in extensive detail. -Given the amount of pain that she is experiencing I believe she will benefit from a steroid injection of decrease inflammatory component also of pain.  Patient agrees with plan like to proceed with steroid injection. -A steroid injection was performed at right first metatarsophalangeal joint using 1% plain Lidocaine and 10 mg of Kenalog. This was well tolerated. -I discussed shoe gear modification and orthotics in extensive detail   No follow-ups on file.

## 2021-06-24 ENCOUNTER — Telehealth: Payer: Self-pay | Admitting: Hematology and Oncology

## 2021-06-24 NOTE — Telephone Encounter (Signed)
.  Called pt per 6/8inbasket , Patient was unavailable, a message with appt time and date was left with number on file.   

## 2021-07-03 ENCOUNTER — Other Ambulatory Visit: Payer: Self-pay | Admitting: Internal Medicine

## 2021-07-04 ENCOUNTER — Other Ambulatory Visit: Payer: Self-pay | Admitting: Internal Medicine

## 2021-07-05 ENCOUNTER — Ambulatory Visit: Payer: Medicare Other | Admitting: Hematology and Oncology

## 2021-07-05 NOTE — Telephone Encounter (Signed)
Requested medications are due for refill today.  yes  Requested medications are on the active medications list.  yes  Last refill. 12/302022 #270 1 refill  Future visit scheduled.   yes  Notes to clinic.  Medication refill failed protocol d/t expired labs.    Requested Prescriptions  Pending Prescriptions Disp Refills   hydrALAZINE (APRESOLINE) 100 MG tablet [Pharmacy Med Name: hydrALAZINE HCl 100 MG Oral Tablet] 270 tablet 3    Sig: TAKE 1 TABLET BY MOUTH 3  TIMES DAILY     Cardiovascular:  Vasodilators Failed - 07/04/2021 11:55 PM      Failed - ANA Screen, Ifa, Serum in normal range and within 360 days    Anti Nuclear Antibody(ANA)  Date Value Ref Range Status  10/17/2014 Negative Negative Final         Passed - HCT in normal range and within 360 days    HCT  Date Value Ref Range Status  01/12/2021 38.4 35.0 - 45.0 % Final  10/12/2013 40.5 35.0 - 47.0 % Final  08/13/2009 36.8 34.8 - 46.6 % Final         Passed - HGB in normal range and within 360 days    Hemoglobin  Date Value Ref Range Status  01/12/2021 12.5 11.7 - 15.5 g/dL Final   HGB  Date Value Ref Range Status  10/12/2013 13.5 12.0 - 16.0 g/dL Final  08/13/2009 12.8 11.6 - 15.9 g/dL Final         Passed - RBC in normal range and within 360 days    RBC  Date Value Ref Range Status  01/12/2021 4.18 3.80 - 5.10 Million/uL Final         Passed - WBC in normal range and within 360 days    WBC  Date Value Ref Range Status  01/12/2021 3.9 3.8 - 10.8 Thousand/uL Final         Passed - PLT in normal range and within 360 days    Platelets  Date Value Ref Range Status  01/12/2021 250 140 - 400 Thousand/uL Final  08/13/2009 271 145 - 400 10e3/uL Final   Platelet  Date Value Ref Range Status  10/12/2013 278 150 - 440 x10 3/mm 3 Final         Passed - Last BP in normal range    BP Readings from Last 1 Encounters:  06/21/21 (!) 127/59         Passed - Valid encounter within last 12 months    Recent  Outpatient Visits           2 weeks ago Aortic atherosclerosis ([place])   [facility] [place], Coralie Keens, NP   3 weeks ago Primary hypertension   [place], Virl Diamond, RPH-CPP   1 month ago Primary hypertension   [place], Virl Diamond, RPH-CPP   1 month ago Primary hypertension   [place], Virl Diamond, RPH-CPP   5 months ago Encounter for general adult medical examination with abnormal findings   [facility], Coralie Keens, NP       Future Appointments             Tomorrow Garnette Gunner, Coralie Keens, NP [facility], [facility]

## 2021-07-05 NOTE — Telephone Encounter (Signed)
Requested medications are due for refill today.  Too soon  Requested medications are on the active medications list.  yes  Last refill. 3/10/20203 #90 1 refill  Future visit scheduled.   yes  Notes to clinic.  Pt is requesting a 1 year supply.    Requested Prescriptions  Pending Prescriptions Disp Refills   irbesartan (AVAPRO) 300 MG tablet [Pharmacy Med Name: Irbesartan 300 MG Oral Tablet] 100 tablet 2    Sig: TAKE 1 TABLET BY MOUTH  DAILY     Cardiovascular:  Angiotensin Receptor Blockers Failed - 07/03/2021  5:31 AM      Failed - Cr in normal range and within 180 days    Creat  Date Value Ref Range Status  06/17/2021 1.12 (H) 0.50 - 1.05 mg/dL Final   Creatinine,U  Date Value Ref Range Status  04/03/2014 447.0 mg/dL Final   Creatinine, Urine  Date Value Ref Range Status  01/12/2021 423 (H) 20 - 275 mg/dL Final         Passed - K in normal range and within 180 days    Potassium  Date Value Ref Range Status  06/17/2021 4.2 3.5 - 5.3 mmol/L Final  10/12/2013 3.9 3.5 - 5.1 mmol/L Final         Passed - Patient is not pregnant      Passed - Last BP in normal range    BP Readings from Last 1 Encounters:  06/21/21 (!) 127/59         Passed - Valid encounter within last 6 months    Recent Outpatient Visits           2 weeks ago Aortic atherosclerosis ([place])   [facility] [place], Coralie Keens, NP   3 weeks ago Primary hypertension   [place], Virl Diamond, RPH-CPP   1 month ago Primary hypertension   [place], Virl Diamond, RPH-CPP   1 month ago Primary hypertension   [place], Virl Diamond, RPH-CPP   5 months ago Encounter for general adult medical examination with abnormal findings   [facility], Coralie Keens, NP       Future Appointments             Tomorrow Garnette Gunner, Coralie Keens, NP [facility], [facility]

## 2021-07-06 ENCOUNTER — Encounter: Payer: Self-pay | Admitting: Internal Medicine

## 2021-07-06 ENCOUNTER — Ambulatory Visit (INDEPENDENT_AMBULATORY_CARE_PROVIDER_SITE_OTHER): Payer: Medicare Other | Admitting: Internal Medicine

## 2021-07-06 DIAGNOSIS — Z6834 Body mass index (BMI) 34.0-34.9, adult: Secondary | ICD-10-CM

## 2021-07-06 DIAGNOSIS — E1165 Type 2 diabetes mellitus with hyperglycemia: Secondary | ICD-10-CM | POA: Diagnosis not present

## 2021-07-06 DIAGNOSIS — E6609 Other obesity due to excess calories: Secondary | ICD-10-CM | POA: Diagnosis not present

## 2021-07-06 NOTE — Patient Instructions (Signed)

## 2021-07-06 NOTE — Assessment & Plan Note (Signed)
Encourage diet and exercise for weight loss 

## 2021-07-06 NOTE — Progress Notes (Signed)
Subjective:    Patient ID: Carrie Singh, female    DOB: 09-06-1955, 66 y.o.   MRN: [id number]  HPI  Patient presents to clinic today for 2-week follow-up for HTN.  At her last visit, her Amlodipine was increased to 10 mg daily in addition to her Hydralazine, Carvedilol and Irbesartan.  She has been taking medication as prescribed.  Her BP today is 138/78.  ECG from 02/2021 reviewed.  She is also due to have her A1c repeated.  It was too soon at her last visit to have this done.  She is currently taking Metformin as prescribed.  Review of Systems     Past Medical History:  Diagnosis Date   Breast cancer ([place]) 01/2021   left breast IDC   Breast cancer, right breast ([place]) 12/2007   a. s/p chemo, radiation, and lumpectomy with negative sentinel lymph node biopsy    Complication of anesthesia    woke up during colonoscopy   Depression    Diabetes mellitus without complication (HCC)    GERD (gastroesophageal reflux disease)    Hyperlipidemia    Hypertension    resistant, negative renal Dopplers and negative CT angiogram   Migraines    Obesity     Current Outpatient Medications  Medication Sig Dispense Refill   amLODipine (NORVASC) 10 MG tablet Take 1 tablet (10 mg total) by mouth daily. 90 tablet 1   Biotin w/ Vitamins C & E (HAIR SKIN & NAILS GUMMIES PO) Take by mouth.     calcium carbonate (OSCAL) 1500 (600 Ca) MG TABS tablet Take by mouth 2 (two) times daily with a meal.     carvedilol (COREG) 12.5 MG tablet Take 12.5 mg by mouth in the morning and at bedtime.     cholecalciferol (VITAMIN D3) 25 MCG (1000 UNIT) tablet Take 2,000 Units by mouth daily.     hydrALAZINE (APRESOLINE) 100 MG tablet TAKE 1 TABLET BY MOUTH 3 TIMES  DAILY 270 tablet 0   irbesartan (AVAPRO) 300 MG tablet TAKE 1 TABLET BY MOUTH  DAILY 90 tablet 1   Lancets (ONETOUCH DELICA PLUS [id number]) MISC CHECK BLOOD SUGAR TWICE  DAILY 200 each 3   letrozole (FEMARA) 2.5 MG tablet Take 1 tablet (2.5 mg total) by  mouth daily. 90 tablet 3   metFORMIN (GLUCOPHAGE) 1000 MG tablet Take 1,000 mg by mouth 2 (two) times daily with a meal.     Multiple Vitamins-Minerals (ONE-A-DAY WOMENS VITACRAVES) CHEW Chew 1 tablet by mouth daily.      ONETOUCH ULTRA test strip USE TWICE DAILY AS DIRECTED 200 strip 3   pravastatin (PRAVACHOL) 40 MG tablet TAKE 1 TABLET BY MOUTH DAILY 90 tablet 1   repaglinide (PRANDIN) 0.5 MG tablet Take 0.5 mg by mouth 3 (three) times daily before meals.     No current facility-administered medications for this visit.    No Known Allergies  Family History  Problem Relation Age of Onset   Stroke Mother    Colon cancer Father    Heart disease Father    Asthma Father    Cancer Father        colon   Cancer Sister        lung   Colon cancer Brother    Cancer Brother        colon   Diabetes Brother    Breast cancer Other 66   Esophageal cancer Neg Hx    Rectal cancer Neg Hx    Stomach cancer Neg  Hx     Social History   Socioeconomic History   Marital status: Married    Spouse name: [name]   Number of children: Not on file   Years of education: 12+   Highest education level: Not on file  Occupational History   Occupation: [employer]  Tobacco Use   Smoking status: Never   Smokeless tobacco: Never  Vaping Use   Vaping Use: Never used  Substance and Sexual Activity   Alcohol use: No    Alcohol/week: 0.0 standard drinks of alcohol   Drug use: No   Sexual activity: Yes  Other Topics Concern   Not on file  Social History Narrative   Lives at home with husband.   Caffeine use: 1-2 drinks per month         Epworth Sleepiness Scale = 15 (as of 01/01/15)   Social Determinants of Health   Financial Resource Strain: Not on file  Food Insecurity: Not on file  Transportation Needs: Not on file  Physical Activity: Not on file  Stress: Not on file  Social Connections: Not on file  Intimate Partner Violence: Not on file     Constitutional: Denies fever, malaise,  fatigue, headache or abrupt weight changes.  Respiratory: Denies difficulty breathing, shortness of breath, cough or sputum production.   Cardiovascular: Denies chest pain, chest tightness, palpitations or swelling in the hands or feet.   and swelling.  Skin: Denies redness, rashes, lesions or ulcercations.  Neurological: Denies numbness, tingling, weakness or problems with balance and coordination.    No other specific complaints in a complete review of systems (except as listed in HPI above).  Objective:   Physical Exam  BP 138/78 (BP Location: Right Arm, Patient Position: Sitting, Cuff Size: Large)   Pulse 72   Temp (!) 97.3 F (36.3 C) (Temporal)   Wt 206 lb (93.4 kg)   SpO2 99%   BMI 34.28 kg/m   Wt Readings from Last 3 Encounters:  06/21/21 207 lb 6.4 oz (94.1 kg)  06/17/21 205 lb (93 kg)  04/16/21 211 lb (95.7 kg)    General: Appears t her stated age, obese, in NAD. Skin: Warm, dry and intact. No ulcerations noted. HEENT: Head: normal shape and size; Eyes: sclera white, no icterus, conjunctiva pink, PERRLA and EOMs intact;  Cardiovascular: Normal rate and rhythm. S1,S2 noted.  No murmur, rubs or gallops noted. No JVD or BLE edema.  Pulmonary/Chest: Normal effort and positive vesicular breath sounds. No respiratory distress. No wheezes, rales or ronchi noted.  Musculoskeletal: No difficulty with gait.  Neurological: Alert and oriented.    BMET    Component Value Date/Time   NA 142 06/17/2021 0928   NA 146 (H) 01/20/2015 1457   NA 143 10/12/2013 1510   K 4.2 06/17/2021 0928   K 3.9 10/12/2013 1510   CL 105 06/17/2021 0928   CL 109 (H) 10/12/2013 1510   CO2 26 06/17/2021 0928   CO2 29 10/12/2013 1510   GLUCOSE 171 (H) 06/17/2021 0928   GLUCOSE 92 10/12/2013 1510   BUN 17 06/17/2021 0928   BUN 17 01/20/2015 1457   BUN 16 10/12/2013 1510   CREATININE 1.12 (H) 06/17/2021 0928   CALCIUM 9.8 06/17/2021 0928   CALCIUM 9.4 10/12/2013 1510   GFRNONAA 57 (L)  03/05/2021 0927   GFRNONAA 56 (L) 10/12/2013 1510   GFRAA >60 01/24/2018 2159   GFRAA >60 10/12/2013 1510    Lipid Panel     Component Value Date/Time  CHOL 173 01/12/2021 1346   CHOL 179 10/17/2014 1527   TRIG 148 01/12/2021 1346   HDL 62 01/12/2021 1346   HDL 59 10/17/2014 1527   CHOLHDL 2.8 01/12/2021 1346   VLDL 15.2 01/09/2020 0957   LDLCALC 86 01/12/2021 1346    CBC    Component Value Date/Time   WBC 3.9 01/12/2021 1346   RBC 4.18 01/12/2021 1346   HGB 12.5 01/12/2021 1346   HGB 13.5 10/12/2013 1510   HGB 12.8 08/13/2009 0946   HCT 38.4 01/12/2021 1346   HCT 40.5 10/12/2013 1510   HCT 36.8 08/13/2009 0946   PLT 250 01/12/2021 1346   PLT 278 10/12/2013 1510   PLT 271 08/13/2009 0946   MCV 91.9 01/12/2021 1346   MCV 87 10/12/2013 1510   MCV 87.0 08/13/2009 0946   MCH 29.9 01/12/2021 1346   MCHC 32.6 01/12/2021 1346   RDW 12.4 01/12/2021 1346   RDW 13.6 10/12/2013 1510   RDW 13.8 08/13/2009 0946   LYMPHSABS 2.1 01/24/2018 2159   LYMPHSABS 1.5 08/13/2009 0946   MONOABS 0.3 01/24/2018 2159   MONOABS 0.2 08/13/2009 0946   EOSABS 0.1 01/24/2018 2159   EOSABS 0.1 08/13/2009 0946   BASOSABS 0.1 01/24/2018 2159   BASOSABS 0.0 08/13/2009 0946    Hgb A1C Lab Results  Component Value Date   HGBA1C 5.6 01/12/2021            Assessment & Plan:     Webb Silversmith, NP

## 2021-07-06 NOTE — Assessment & Plan Note (Signed)
POCT A1c 5.9% Continue Metformin Reinforced DASH diet and exercise for weight loss We will monitor

## 2021-07-07 ENCOUNTER — Telehealth: Payer: Self-pay

## 2021-07-07 NOTE — Telephone Encounter (Signed)
I called and left a message on the patient personal voicemail requesting that she return my call at her convenience concerning her medicare wellness visit.   Donnie Mesa, [place] [phone]

## 2021-07-08 ENCOUNTER — Ambulatory Visit (INDEPENDENT_AMBULATORY_CARE_PROVIDER_SITE_OTHER): Payer: Medicare Other

## 2021-07-08 DIAGNOSIS — Z0000 Encounter for general adult medical examination without abnormal findings: Secondary | ICD-10-CM | POA: Diagnosis not present

## 2021-07-08 NOTE — Patient Instructions (Signed)
Screening for Type 2 Diabetes  A screening test for type 2 diabetes (type 2 diabetes mellitus) is a blood test to measure your blood sugar (glucose) level. This test is done to check for early signs of diabetes, before you develop symptoms.  Type 2 diabetes is a long-term (chronic) disease. In type 2 diabetes, one or both of these problems may be present: The pancreas does not make enough of a hormone called insulin. Cells in the body do not respond properly to insulin that the body makes (insulin resistance). Normally, insulin allows blood sugar (glucose) to enter cells in the body. The cells use glucose for energy. Insulin resistance or lack of insulin causes excess glucose to build up in the blood instead of going into cells. This results in high blood glucose levels (hyperglycemia), which can cause many complications. You may be screened for type 2 diabetes as part of your regular health care, especially if you have a high risk for diabetes. Screening can help to identify type 2 diabetes at its early stage (prediabetes). Identifying and treating prediabetes may delay or prevent the development of type 2 diabetes. Tell a health care provider about: All medicines you are taking, including vitamins, herbs, eye drops, creams, and over-the-counter medicines. Any bleeding problems you have. Any medical conditions you have. Whether you are pregnant or may be pregnant. Who should be screened for type 2 diabetes? Adults Adults age 68 and older. These adults should be screened once every three years. Adults who are any age, are overweight, and have one other risk factor. These adults should be screened once every three years. Adults who have normal blood glucose levels and two or more risk factors. These adults may be screened once every year (annually). Women who have had gestational diabetes in the past. These women should be screened once every three years. Pregnant women who have risk factors. These  women should be screened at their first prenatal visit and again between weeks 24 and 28 of pregnancy. Children and adolescents Children and adolescents should be screened for type 2 diabetes if they are overweight and have any of the following risk factors: A family history of type 2 diabetes. Being a member of a high-risk ethnic group. Signs of insulin resistance or conditions that are associated with insulin resistance. A mother who had gestational diabetes while pregnant. Screening should be done at least once every three years, starting at age 68 or at the onset of puberty, whichever comes first. Your health care provider or your child's health care provider may recommend having a screening more or less often. What are the risk factors for type 2 diabetes? The following are factors that may make you more likely to develop type 2 diabetes and can be modified: Not getting enough exercise. Having high blood pressure. Having low levels of good cholesterol (HDL-C) or high levels of blood fats (triglycerides). Having high blood glucose in a previous blood test. Being overweight or obese. The following are factors that may make you more likely to develop type 2 diabetes and can not be modified: Having a parent or sibling (first-degree relative) who has diabetes. Being of American-Indian, African-American, Hispanic/Latino, Asian, or [place] descent. Being older than age 74. Having a history of diabetes during pregnancy (gestational diabetes). Having certain diseases or conditions that may be caused by insulin resistance, including: Acanthosis nigricans. This is a condition that causes dark skin on the neck, armpits, and groin. Polycystic ovary syndrome (PCOS). Cardiovascular heart disease. What happens  during screening? During screening, your health care provider may ask questions about: Your health and your risk factors, including your activity level and any medical conditions that  you have. The health of your first-degree relatives. Past pregnancies, if this applies. Your health care provider will also do a physical exam, including a blood pressure measurement and blood tests. There are four blood tests that can be used to screen for type 2 diabetes. You may have one or more of the following: A fasting blood glucose (FBG) test. You will not be allowed to eat (you will fast) for 8 hours or more before a blood sample is taken. A random blood glucose test. This test checks your blood glucose at any time of the day regardless of when you ate. An oral glucose tolerance test (OGTT). This test measures your blood glucose at two times: After you have not eaten (have fasted) overnight. This is your baseline glucose level. Two hours after you drink a glucose-containing beverage. An A1C (hemoglobin A1C) blood test. This test provides information about blood glucose control over the previous 2-3 months. What do the results mean? Your test results are a measurement of how much glucose is in your blood. Normal blood glucose levels mean that you do not have diabetes or prediabetes. High blood glucose levels may mean that you have prediabetes or diabetes. Depending on the results, other tests may be needed to confirm the diagnosis. You may be diagnosed with type 2 diabetes if: Your FBG level is 126 mg/dL (7.0 mmol/L) or higher. Your random blood glucose level is 200 mg/dL (11.1 mmol/L) or higher. Your A1C level is 6.5% or higher. Your OGTT result is higher than 200 mg/dL (11.1 mmol/L). These blood tests may be repeated to confirm your diagnosis. Talk with your health care provider about what your results mean. Summary A screening test for type 2 diabetes (type 2 diabetes mellitus) is a blood test to measure your blood sugar (glucose) level. Know what your risk factors are for developing type 2 diabetes. If you are at risk, get screening tests as often as told by your health care  provider. Screening may help you identify type 2 diabetes at its early stage (prediabetes). Identifying and treating prediabetes may delay or prevent the development of type 2 diabetes. This information is not intended to replace advice given to you by your health care provider. Make sure you discuss any questions you have with your health care provider. Document Revised: 03/30/2020 Document Reviewed: 03/30/2020 Elsevier Patient Education  [place] Maintenance, Female Adopting a healthy lifestyle and getting preventive care are important in promoting health and wellness. Ask your health care provider about: The right schedule for you to have regular tests and exams. Things you can do on your own to prevent diseases and keep yourself healthy. What should I know about diet, weight, and exercise? Eat a healthy diet  Eat a diet that includes plenty of vegetables, fruits, low-fat dairy products, and lean protein. Do not eat a lot of foods that are high in solid fats, added sugars, or sodium. Maintain a healthy weight Body mass index (BMI) is used to identify weight problems. It estimates body fat based on height and weight. Your health care provider can help determine your BMI and help you achieve or maintain a healthy weight. Get regular exercise Get regular exercise. This is one of the most important things you can do for your health. Most adults should: Exercise for at least 150  minutes each week. The exercise should increase your heart rate and make you sweat (moderate-intensity exercise). Do strengthening exercises at least twice a week. This is in addition to the moderate-intensity exercise. Spend less time sitting. Even light physical activity can be beneficial. Watch cholesterol and blood lipids Have your blood tested for lipids and cholesterol at 68 years of age, then have this test every 5 years. Have your cholesterol levels checked more often if: Your lipid or  cholesterol levels are high. You are older than 68 years of age. You are at high risk for heart disease. What should I know about cancer screening? Depending on your health history and family history, you may need to have cancer screening at various ages. This may include screening for: Breast cancer. Cervical cancer. Colorectal cancer. Skin cancer. Lung cancer. What should I know about heart disease, diabetes, and high blood pressure? Blood pressure and heart disease High blood pressure causes heart disease and increases the risk of stroke. This is more likely to develop in people who have high blood pressure readings or are overweight. Have your blood pressure checked: Every 3-5 years if you are 83-66 years of age. Every year if you are 1 years old or older. Diabetes Have regular diabetes screenings. This checks your fasting blood sugar level. Have the screening done: Once every three years after age 36 if you are at a normal weight and have a low risk for diabetes. More often and at a younger age if you are overweight or have a high risk for diabetes. What should I know about preventing infection? Hepatitis B If you have a higher risk for hepatitis B, you should be screened for this virus. Talk with your health care provider to find out if you are at risk for hepatitis B infection. Hepatitis C Testing is recommended for: Everyone born from [birth year] through 1967. Anyone with known risk factors for hepatitis C. Sexually transmitted infections (STIs) Get screened for STIs, including gonorrhea and chlamydia, if: You are sexually active and are younger than 68 years of age. You are older than 68 years of age and your health care provider tells you that you are at risk for this type of infection. Your sexual activity has changed since you were last screened, and you are at increased risk for chlamydia or gonorrhea. Ask your health care provider if you are at risk. Ask your health care  provider about whether you are at high risk for HIV. Your health care provider may recommend a prescription medicine to help prevent HIV infection. If you choose to take medicine to prevent HIV, you should first get tested for HIV. You should then be tested every 3 months for as long as you are taking the medicine. Pregnancy If you are about to stop having your period (premenopausal) and you may become pregnant, seek counseling before you get pregnant. Take 400 to 800 micrograms (mcg) of folic acid every day if you become pregnant. Ask for birth control (contraception) if you want to prevent pregnancy. Osteoporosis and menopause Osteoporosis is a disease in which the bones lose minerals and strength with aging. This can result in bone fractures. If you are 61 years old or older, or if you are at risk for osteoporosis and fractures, ask your health care provider if you should: Be screened for bone loss. Take a calcium or vitamin D supplement to lower your risk of fractures. Be given hormone replacement therapy (HRT) to treat symptoms of menopause. Follow these instructions  at home: Alcohol use Do not drink alcohol if: Your health care provider tells you not to drink. You are pregnant, may be pregnant, or are planning to become pregnant. If you drink alcohol: Limit how much you have to: 0-1 drink a day. Know how much alcohol is in your drink. In the [place] one drink equals one 12 oz bottle of beer (355 mL), one 5 oz glass of wine (148 mL), or one 1 oz glass of hard liquor (44 mL). Lifestyle Do not use any products that contain nicotine or tobacco. These products include cigarettes, chewing tobacco, and vaping devices, such as e-cigarettes. If you need help quitting, ask your health care provider. Do not use street drugs. Do not share needles. Ask your health care provider for help if you need support or information about quitting drugs. General instructions Schedule regular health, dental, and  eye exams. Stay current with your vaccines. Tell your health care provider if: You often feel depressed. You have ever been abused or do not feel safe at home. Summary Adopting a healthy lifestyle and getting preventive care are important in promoting health and wellness. Follow your health care provider's instructions about healthy diet, exercising, and getting tested or screened for diseases. Follow your health care provider's instructions on monitoring your cholesterol and blood pressure. This information is not intended to replace advice given to you by your health care provider. Make sure you discuss any questions you have with your health care provider. Document Revised: 05/25/2020 Document Reviewed: 05/25/2020 Elsevier Patient Education  [place].

## 2021-07-08 NOTE — Progress Notes (Signed)
Subjective:   I connected with  Carrie Singh on 07/08/21 by a audio enabled telemedicine application and verified that I am speaking with the correct person using two identifiers.  Patient Location: Home  Provider Location: Office/Clinic  I discussed the limitations of evaluation and management by telemedicine. The patient expressed understanding and agreed to proceed.    Carrie Singh is a 66 y.o. female who presents for Medicare Annual (Subsequent) preventive examination.  Review of Systems    Per HPI unless specifically indicated below        Objective:    There were no vitals filed for this visit. There is no height or weight on file to calculate BMI.     03/10/2021    6:56 AM 03/03/2021   11:52 AM 02/10/2021    4:08 PM 08/08/2020    7:22 AM 09/05/2018    8:34 AM 01/24/2018    5:42 PM 06/24/2016    8:59 AM  Advanced Directives  Does Patient Have a Medical Advance Directive? No No No No No No No  Would patient like information on creating a medical advance directive? No - Patient declined No - Patient declined No - Patient declined No - Guardian declined No - Patient declined      Current Medications (verified) Outpatient Encounter Medications as of 07/08/2021  Medication Sig   amLODipine (NORVASC) 10 MG tablet Take 1 tablet (10 mg total) by mouth daily.   Biotin w/ Vitamins C & E (HAIR SKIN & NAILS GUMMIES PO) Take by mouth.   calcium carbonate (OSCAL) 1500 (600 Ca) MG TABS tablet Take by mouth 2 (two) times daily with a meal.   carvedilol (COREG) 12.5 MG tablet Take 12.5 mg by mouth in the morning and at bedtime.   hydrALAZINE (APRESOLINE) 100 MG tablet TAKE 1 TABLET BY MOUTH 3 TIMES  DAILY   irbesartan (AVAPRO) 300 MG tablet TAKE 1 TABLET BY MOUTH  DAILY   Lancets (ONETOUCH DELICA PLUS [id number]) MISC CHECK BLOOD SUGAR TWICE  DAILY   letrozole (FEMARA) 2.5 MG tablet Take 1 tablet (2.5 mg total) by mouth daily.   metFORMIN (GLUCOPHAGE) 1000 MG tablet Take 1,000 mg  by mouth 2 (two) times daily with a meal.   Multiple Vitamins-Minerals (ONE-A-DAY WOMENS VITACRAVES) CHEW Chew 1 tablet by mouth daily.    ONETOUCH ULTRA test strip USE TWICE DAILY AS DIRECTED   pravastatin (PRAVACHOL) 40 MG tablet TAKE 1 TABLET BY MOUTH DAILY   repaglinide (PRANDIN) 0.5 MG tablet Take 0.5 mg by mouth 3 (three) times daily before meals.   [DISCONTINUED] cholecalciferol (VITAMIN D3) 25 MCG (1000 UNIT) tablet Take 2,000 Units by mouth daily. (Patient not taking: Reported on 07/08/2021)   No facility-administered encounter medications on file as of 07/08/2021.    Allergies (verified) Patient has no known allergies.   History: Past Medical History:  Diagnosis Date   Breast cancer ([place]) 01/2021   left breast IDC   Breast cancer, right breast ([place]) 12/2007   a. s/p chemo, radiation, and lumpectomy with negative sentinel lymph node biopsy    Complication of anesthesia    woke up during colonoscopy   Depression    Diabetes mellitus without complication (HCC)    GERD (gastroesophageal reflux disease)    Hyperlipidemia    Hypertension    resistant, negative renal Dopplers and negative CT angiogram   Migraines    Obesity    Past Surgical History:  Procedure Laterality Date   ABDOMINAL HYSTERECTOMY  BREAST LUMPECTOMY     CYST EXCISION     FOOT SURGERY     MASTECTOMY Right    MASTECTOMY W/ SENTINEL NODE BIOPSY Right 03/18/2016   Procedure: RIGHT MASTECTOMY WITH RIGHT AXILLARY SENTINEL LYMPH NODE BIOPSY;  Surgeon: Alphonsa Overall, MD;  Location: [facility];  Service: General;  Laterality: Right;   MASTECTOMY W/ SENTINEL NODE BIOPSY Left 03/10/2021   Procedure: LEFT MASTECTOMY WITH LEFT SENTINEL LYMPH NODE BIOPSY;  Surgeon: Erroll Luna, MD;  Location: [facility];  Service: General;  Laterality: Left;   ovary tumor     vocal cord nodules     Family History  Problem Relation Age of Onset   Stroke Mother    Colon cancer Father    Heart disease Father     Asthma Father    Cancer Father        colon   Cancer Sister        lung   Colon cancer Brother    Cancer Brother        colon   Diabetes Brother    Breast cancer Other 40   Esophageal cancer Neg Hx    Rectal cancer Neg Hx    Stomach cancer Neg Hx    Social History   Socioeconomic History   Marital status: Married    Spouse name: Carrie Singh   Number of children: 3   Years of education: 12+   Highest education level: Not on file  Occupational History   Occupation: Disabled  Tobacco Use   Smoking status: Never   Smokeless tobacco: Never  Vaping Use   Vaping Use: Never used  Substance and Sexual Activity   Alcohol use: No    Alcohol/week: 0.0 standard drinks of alcohol   Drug use: No   Sexual activity: Yes  Other Topics Concern   Not on file  Social History Narrative   Lives at home with husband.   Caffeine use: 1-2 drinks per month         Epworth Sleepiness Scale = 15 (as of 01/01/15)   Social Determinants of Health   Financial Resource Strain: Low Risk  (07/08/2021)   Overall Financial Resource Strain (CARDIA)    Difficulty of Paying Living Expenses: Not hard at all  Food Insecurity: No Food Insecurity (07/08/2021)   Hunger Vital Sign    Worried About Running Out of Food in the Last Year: Never true    Ran Out of Food in the Last Year: Never true  Transportation Needs: No Transportation Needs (07/08/2021)   PRAPARE - Hydrologist (Medical): No    Lack of Transportation (Non-Medical): No  Physical Activity: Not on file  Stress: No Stress Concern Present (07/08/2021)   [place]    Feeling of Stress : Not at all  Social Connections: [place] (07/08/2021)   Social Connection and Isolation Panel [NHANES]    Frequency of Communication with Friends and Family: More than three times a week    Frequency of Social Gatherings with Friends and Family: Once a week     Attends Religious Services: More than 4 times per year    Active Member of [employer] or Organizations: Yes    Attends Music therapist: More than 4 times per year    Marital Status: Married    Tobacco Counseling Counseling given: Not Answered   Clinical Intake:  Pre-visit preparation completed: No  Pain :  No/denies pain     Nutritional Status: BMI > 30  Obese Nutritional Risks: Nausea/ vomitting/ diarrhea     Diabetic?Nutrition Risk Assessment:  Has the patient had any N/V/D within the last 2 months?  No  Does the patient have any non-healing wounds?  No  Has the patient had any unintentional weight loss or weight gain?  No   Diabetes:  Is the patient diabetic?  Yes  If diabetic, was a CBG obtained today?  No  Did the patient bring in their glucometer from home?   Telephone visit  How often do you monitor your CBG's? daily.   Financial Strains and Diabetes Management:  Are you having any financial strains with the device, your supplies or your medication? No .  Does the patient want to be seen by Chronic Care Management for management of their diabetes?  No  Would the patient like to be referred to a Nutritionist or for Diabetic Management?  No   Diabetic Exams:  Diabetic Eye Exam: Completed 12/07/2020 Diabetic Foot Exam: Completed 12/31/2020    Interpreter Needed?: No  Information entered by :: Donnie Mesa, [place]   Activities of Daily Living    07/08/2021   12:45 PM 07/06/2021    9:15 AM  In your present state of health, do you have any difficulty performing the following activities:  Hearing? 0 1  Vision? 1 0  Difficulty concentrating or making decisions? 1 0  Walking or climbing stairs? 1 0  Dressing or bathing? 0 0  Doing errands, shopping? 0 0    Patient Care Team: Jearld Fenton, NP as PCP - General (Internal Medicine) Minna Merritts, MD as Consulting Physician (Cardiology) Nicholas Lose, MD as Consulting Physician (Hematology  and Oncology) Delice Bison, Charlestine Massed, NP as Nurse Practitioner (Hematology and Oncology) Erroll Luna, MD as Consulting Physician (General Surgery)  Indicate any recent Medical Services you may have received from other than [facility] providers in the past year (date may be approximate).     Assessment:   This is a routine wellness examination for [place].  Hearing/Vision screen No results found.  Dietary issues and exercise activities discussed: Current Exercise Habits: Structured exercise class, Type of exercise: walking, Time (Minutes): 25, Frequency (Times/Week): 4, Weekly Exercise (Minutes/Week): 100, Intensity: Moderate   Goals Addressed   None   Depression Screen    07/06/2021    9:15 AM 06/17/2021   10:34 AM 08/20/2020    8:52 AM 01/09/2020    9:30 AM 05/30/2019    8:07 AM 12/06/2018    8:41 AM 09/05/2018    8:34 AM  PHQ 2/9 Scores  PHQ - 2 Score 0 2 4 0 0 2 0  PHQ- 9 Score 0 4 18 0 3 4     Fall Risk    07/08/2021   12:44 PM 07/08/2021   12:43 PM 07/06/2021    9:15 AM 06/17/2021   10:34 AM 08/20/2020    8:53 AM  Fall Risk   Falls in the past year? 0 0 0 0 0  Number falls in past yr:  0 0 0 0  Injury with Fall?  0 0 0 0  Risk for fall due to :  No Fall Risks No Fall Risks No Fall Risks No Fall Risks  Follow up  Falls evaluation completed Falls evaluation completed Falls evaluation completed Falls evaluation completed    FALL RISK PREVENTION PERTAINING TO THE HOME:  Any stairs in or around the home? Yes  If  so, are there any without handrails? Yes  Home free of loose throw rugs in walkways, pet beds, electrical cords, etc? Yes Adequate lighting in your home to reduce risk of falls? Yes   ASSISTIVE DEVICES UTILIZED TO PREVENT FALLS:  Life alert? No  Use of a cane, walker or w/c? Yes  Grab bars in the bathroom? No  Shower chair or bench in shower? No  Elevated toilet seat or a handicapped toilet? No   Cognitive Function:        07/08/2021   12:45 PM  6CIT  Screen  What Year? 0 points  What month? 0 points  What time? 0 points  Count back from 20 0 points  Months in reverse 0 points  Repeat phrase 0 points  Total Score 0 points    Immunizations Immunization History  Administered Date(s) Administered   Fluad Quad(high Dose 65+) 09/20/2018, 10/30/2019, 11/16/2020   Influenza-Unspecified 03/06/2017   PFIZER(Purple Top)SARS-COV-2 Vaccination 03/22/2019, 04/12/2019, 10/30/2019   Pfizer Covid-19 Vaccine Bivalent Booster 77yr & up 01/19/2021   Pneumococcal Conjugate-13 12/06/2018   Pneumococcal Polysaccharide-23 01/09/2020   Tdap 12/17/2008    TDAP status: Up to date  Flu Vaccine status: Up to date  Pneumococcal vaccine status: Up to date  Covid-19 vaccine status: Completed vaccines  Qualifies for Shingles Vaccine? Yes   Zostavax completed No   Shingrix Completed?: Yes  Screening Tests Health Maintenance  Topic Date Due   Zoster Vaccines- Shingrix (1 of 2) Never done   TETANUS/TDAP  01/12/2022 (Originally 12/18/2018)   HEMOGLOBIN A1C  07/13/2021   INFLUENZA VACCINE  08/17/2021   OPHTHALMOLOGY EXAM  12/07/2021   FOOT EXAM  01/12/2022   MAMMOGRAM  01/01/2023   COLONOSCOPY (Pts 45-486yrInsurance coverage will need to be confirmed)  04/16/2024   Pneumonia Vaccine 6573Years old  Completed   DEXA SCAN  Completed   COVID-19 Vaccine  Completed   Hepatitis C Screening  Completed   HPV VACCINES  Aged Out    Health Maintenance  Health Maintenance Due  Topic Date Due   Zoster Vaccines- Shingrix (1 of 2) Never done    Colorectal cancer screening: Type of screening: Colonoscopy. Completed 04/16/2021. Repeat every 10 years  Mammogram status: Completed 12/31/2020. Repeat every year  DEXA Scan 12/31/2020  Lung Cancer Screening: (Low Dose CT Chest recommended if Age 68-80ears, 30 pack-year currently smoking OR have quit w/in 15years.) does not qualify.   Lung Cancer Screening Referral: does not qualify   Additional  Screening:  Hepatitis C Screening: does qualify; Completed 09/08/2015  Vision Screening: Recommended annual ophthalmology exams for early detection of glaucoma and other disorders of the eye. Is the patient up to date with their annual eye exam?  Yes  Who is the provider or what is the name of the office in which the patient attends annual eye exams? [facility] If pt is not established with a provider, would they like to be referred to a provider to establish care? No .   Dental Screening: Recommended annual dental exams for proper oral hygiene  Community Resource Referral / Chronic Care Management: CRR required this visit?  No   CCM required this visit?  No      Plan:     I have personally reviewed and noted the following in the patient's chart:   Medical and social history Use of alcohol, tobacco or illicit drugs  Current medications and supplements including opioid prescriptions.  Functional ability and status Nutritional status Physical  activity Advanced directives List of other physicians Hospitalizations, surgeries, and ER visits in previous 12 months Vitals Screenings to include cognitive, depression, and falls Referrals and appointments  In addition, I have reviewed and discussed with patient certain preventive protocols, quality metrics, and best practice recommendations. A written personalized care plan for preventive services as well as general preventive health recommendations were provided to patient.     Wilson Singer, [place]   07/08/2021    Ms. Schroepfer , Thank you for taking time to come for your Medicare Wellness Visit. I appreciate your ongoing commitment to your health goals. Please review the following plan we discussed and let me know if I can assist you in the future.   These are the goals we discussed:  Goals      Pharmacy Goal     Check your blood pressure. Our goal is less than 130/80.  We recommend a blood pressure cuff that goes around  your upper arm, as these are generally the most accurate.   To appropriately check your blood pressure, make sure you do the following:  1) Avoid caffeine, exercise, or tobacco products for 30 minutes before checking. 2) Sit with your back supported in a flat-backed chair. Rest your arm on something flat (arm of the chair, table, etc). 3) Sit still with your feet flat on the floor, resting, for at least 5 minutes.  4) Check your blood pressure. Take 1-2 readings.   Write down these readings and bring with you to any provider appointments. Bring your home blood pressure machine with you to a provider's office for accuracy comparison at least once a year. Make sure you take your blood pressure medications before you come to any office visit.  It was a pleasure speaking with you! Our next telephone call is scheduled for 05/26/2021 at 10 am  Wallace Cullens, PharmD, [place] [phone]           This is a list of the screening recommended for you and due dates:  Health Maintenance  Topic Date Due   Zoster (Shingles) Vaccine (1 of 2) Never done   Tetanus Vaccine  01/12/2022*   Hemoglobin A1C  07/13/2021   Flu Shot  08/17/2021   Eye exam for diabetics  12/07/2021   Complete foot exam   01/12/2022   Mammogram  01/01/2023   Colon Cancer Screening  04/16/2024   Pneumonia Vaccine  Completed   DEXA scan (bone density measurement)  Completed   COVID-19 Vaccine  Completed   Hepatitis C Screening: USPSTF Recommendation to screen - Ages 57-79 yo.  Completed   HPV Vaccine  Aged Out  *Topic was postponed. The date shown is not the original due date.

## 2021-07-24 ENCOUNTER — Other Ambulatory Visit: Payer: Self-pay | Admitting: Internal Medicine

## 2021-07-26 NOTE — Telephone Encounter (Signed)
Requested medication (s) are due for refill today: yes  Requested medication (s) are on the active medication list: yes  Last refill:  02/24/21  Future visit scheduled: no  Notes to clinic:  Unable to refill per protocol, no protocol attached. Routing for approval.    Requested Prescriptions  Pending Prescriptions Disp Refills   ONETOUCH ULTRA test strip [Pharmacy Med Name: OneTouch Ultra In Vitro Strip] 200 strip 2    Sig: USE TWICE DAILY AS DIRECTED     There is no refill protocol information for this order

## 2021-07-27 ENCOUNTER — Other Ambulatory Visit: Payer: Self-pay

## 2021-07-27 ENCOUNTER — Other Ambulatory Visit: Payer: Self-pay | Admitting: Genetic Counselor

## 2021-07-27 ENCOUNTER — Inpatient Hospital Stay: Payer: Medicare Other

## 2021-07-27 ENCOUNTER — Inpatient Hospital Stay: Payer: Medicare Other | Attending: Hematology and Oncology | Admitting: Genetic Counselor

## 2021-07-27 ENCOUNTER — Encounter: Payer: Self-pay | Admitting: Genetic Counselor

## 2021-07-27 DIAGNOSIS — Z1379 Encounter for other screening for genetic and chromosomal anomalies: Secondary | ICD-10-CM

## 2021-07-27 DIAGNOSIS — C50912 Malignant neoplasm of unspecified site of left female breast: Secondary | ICD-10-CM | POA: Diagnosis not present

## 2021-07-27 DIAGNOSIS — Z803 Family history of malignant neoplasm of breast: Secondary | ICD-10-CM | POA: Insufficient documentation

## 2021-07-27 DIAGNOSIS — Z800 Family history of malignant neoplasm of digestive organs: Secondary | ICD-10-CM | POA: Insufficient documentation

## 2021-07-27 DIAGNOSIS — Z170 Estrogen receptor positive status [ER+]: Secondary | ICD-10-CM

## 2021-07-27 DIAGNOSIS — Z853 Personal history of malignant neoplasm of breast: Secondary | ICD-10-CM | POA: Diagnosis not present

## 2021-07-27 LAB — GENETIC SCREENING ORDER

## 2021-07-27 NOTE — Progress Notes (Signed)
REFERRING PROVIDER: Wilber Bihari, NP  PRIMARY PROVIDER:  Jearld Fenton, NP  PRIMARY REASON FOR VISIT:  1. Malignant neoplasm of left breast in female, estrogen receptor positive, unspecified site of breast ([place])   2. History of breast cancer   3. Family history of breast cancer   4. Family history of colon cancer     HISTORY OF PRESENT ILLNESS:   Ms. Brummitt, a 66 y.o. female, was seen for a [place] cancer genetics consultation at the request of Wilber Bihari, NP due to a personal and family history of cancer.  Ms. Kitchens presents to clinic today to discuss the possibility of a hereditary predisposition to cancer, to discuss genetic testing, and to further clarify her future cancer risks, as well as potential cancer risks for family members.   In 2009, at age 52, Ms. Eber was diagnosed with right breast cancer. She was diagnosed with recurrent right breast cancer in 2018. In 2023, at age 34, Ms. Bozarth was diagnosed with left breast cancer.   CANCER HISTORY:  Oncology History  History of breast cancer  12/18/2007 Initial Diagnosis   Right breast cancer treated with lumpectomy followed by radiation and 5 years of antiestrogen therapy with aromatase inhibitor   02/02/2016 Relapse/Recurrence   Right breast biopsy 6:00 position: IDC with DCIS grade 2, ER 100%, PR 90%, Ki-67 10%, HER-2 negative ratio 1.28, screening detected right breast lumps 0.5 cm and 0.3 cm, T1a N0 stage IA clinical stage   03/18/2016 Surgery   Right simple mastectomy: Grade 2 IDC with DCIS 1.5 cm, margins -0/3 lymph nodes, T1b N0 stage IA, ER 100%, PR 90%, Ki-67 10%, HER-2 negative ratio 1.28   03/18/2016 Oncotype testing   11/7%   04/2016 -  Anti-estrogen oral therapy   Letrozole 2.34m daily switch to exemestane 25 mg daily May 2018 switched to anastrozole 1 mg daily due to cost   03/10/2021 Surgery   Left Mastectomy : Grade 2 IDC 1.1 cm, 0/8 LN neg, ER 100%, PR: 0%, ki 67: 15%, HER 2 neg      04/2021 -   Anti-estrogen oral therapy   Letrozole daily   Malignant neoplasm of left breast in female, estrogen receptor positive ([facility]  02/18/2021 Initial Diagnosis   Malignant neoplasm of left breast in female, estrogen receptor positive ([facility] [place]   03/10/2021 Cancer Staging   Staging form: Breast, AJCC 8th Edition - Pathologic stage from 03/10/2021: Stage IA (pT1c, pN0, cM0, G2, ER+, PR-, HER2-) - Signed by CGardenia Phlegm NP on 06/21/2021 Stage prefix: Initial diagnosis Histologic grading system: 3 grade system      RISK FACTORS:  First live birth at age n/a.  OCP use for approximately  6 months    Ovaries intact: no.  Uterus intact: no.  Menopausal status: postmenopausal.  Colonoscopy: yes;  most recent was 03/2021 and 5 tubular adenomas were identified . Mammogram within the last year: yes. Any excessive radiation exposure in the past: no  Past Medical History:  Diagnosis Date   Breast cancer ([facility] 01/2021   left breast IDC   Breast cancer, right breast ([facility] 12/2007   a. s/p chemo, radiation, and lumpectomy with negative sentinel lymph node biopsy    Complication of anesthesia    woke up during colonoscopy   Depression    Diabetes mellitus without complication (HCC)    GERD (gastroesophageal reflux disease)    Hyperlipidemia    Hypertension    resistant, negative renal Dopplers and negative CT angiogram  Migraines    Obesity     Past Surgical History:  Procedure Laterality Date   ABDOMINAL HYSTERECTOMY     BREAST LUMPECTOMY     CYST EXCISION     FOOT SURGERY     MASTECTOMY Right    MASTECTOMY W/ SENTINEL NODE BIOPSY Right 03/18/2016   Procedure: RIGHT MASTECTOMY WITH RIGHT AXILLARY SENTINEL LYMPH NODE BIOPSY;  Surgeon: Alphonsa Overall, MD;  Location: [place];  Service: General;  Laterality: Right;   MASTECTOMY W/ SENTINEL NODE BIOPSY Left 03/10/2021   Procedure: LEFT MASTECTOMY WITH LEFT SENTINEL LYMPH NODE BIOPSY;  Surgeon: Erroll Luna, MD;  Location: [facility];  Service: General;  Laterality: Left;   ovary tumor     vocal cord nodules      Social History   Socioeconomic History   Marital status: Married    Spouse name: Maude Hettich   Number of children: 3   Years of education: 12+   Highest education level: Not on file  Occupational History   Occupation: Disabled  Tobacco Use   Smoking status: Never   Smokeless tobacco: Never  Vaping Use   Vaping Use: Never used  Substance and Sexual Activity   Alcohol use: No    Alcohol/week: 0.0 standard drinks of alcohol   Drug use: No   Sexual activity: Yes  Other Topics Concern   Not on file  Social History Narrative   Lives at home with husband.   Caffeine use: 1-2 drinks per month         Epworth Sleepiness Scale = 15 (as of 01/01/15)   Social Determinants of Health   Financial Resource Strain: Low Risk  (07/08/2021)   Overall Financial Resource Strain (CARDIA)    Difficulty of Paying Living Expenses: Not hard at all  Food Insecurity: No Food Insecurity (07/08/2021)   Hunger Vital Sign    Worried About Running Out of Food in the Last Year: Never true    Ran Out of Food in the Last Year: Never true  Transportation Needs: No Transportation Needs (07/08/2021)   PRAPARE - Hydrologist (Medical): No    Lack of Transportation (Non-Medical): No  Physical Activity: Not on file  Stress: No Stress Concern Present (07/08/2021)   [place]    Feeling of Stress : Not at all  Social Connections: [place] (07/08/2021)   Social Connection and Isolation Panel [NHANES]    Frequency of Communication with Friends and Family: More than three times a week    Frequency of Social Gatherings with Friends and Family: Once a week    Attends Religious Services: More than 4 times per year    Active Member of [employer] or Organizations: Yes    Attends Music therapist: More than 4 times per  year    Marital Status: Married     FAMILY HISTORY:  We obtained a detailed, 4-generation family history.  Significant diagnoses are listed below: Family History  Problem Relation Age of Onset   Stroke Mother    Colon cancer Father 61   Heart disease Father    Asthma Father    Lung cancer Sister    Colon cancer Brother 4   Diabetes Brother    Breast cancer Niece        dx. <50   Breast cancer Niece        dx. <50   Breast cancer Niece  dx. >50   Esophageal cancer Neg Hx    Rectal cancer Neg Hx    Stomach cancer Neg Hx      Ms. Elicker's maternal half-sister was diagnosed with lung cancer. This sister's daughter (Ms. Streight's niece) was diagnosed with breast cancer at an unknown age (>50). Ms. Strahm maternal half-brother was diagnosed with colon cancer at age 59, he is deceased. She has another maternal half-brother who does not have a history of cancer, but he has two daughters (Ms. Tesch's nieces) who both have a history of breast cancer diagnosed under age 98. Her father was diagnosed with colon cancer at age 96, he is deceased.  Ms. Belmonte is unaware of previous family history of genetic testing for hereditary cancer risks. There is no reported Ashkenazi Jewish ancestry.   GENETIC COUNSELING ASSESSMENT: Ms. Parkison is a 66 y.o. female with a personal and family history of cancer which is somewhat suggestive of a hereditary predisposition to cancer given her personal history of contralateral breast cancer. We, therefore, discussed and recommended the following at today's visit.   DISCUSSION: We discussed that 5 - 10% of cancer is hereditary, with most cases of breast cancer associated with BRCA1/2.  There are other genes that can be associated with hereditary breast cancer syndromes.  We discussed that testing is beneficial for several reasons including knowing how to follow individuals after completing their treatment, identifying whether potential treatment options would be  beneficial, and understanding if other family members could be at risk for cancer and allowing them to undergo genetic testing.   We reviewed the characteristics, features and inheritance patterns of hereditary cancer syndromes. We also discussed genetic testing, including the appropriate family members to test, the process of testing, insurance coverage and turn-around-time for results. We discussed the implications of a negative, positive, carrier and/or variant of uncertain significant result. We recommended Ms. Messler pursue genetic testing for a panel that includes genes associated with breast and colon cancer.   Ms. Bhattacharyya elected to have [facility] CancerNext-Expanded Panel. The CancerNext-Expanded gene panel offered by [facility] and includes sequencing, rearrangement, and RNA analysis for the following 77 genes: AIP, ALK, APC, ATM, AXIN2, BAP1, BARD1, BLM, BMPR1A, BRCA1, BRCA2, BRIP1, CDC73, CDH1, CDK4, CDKN1B, CDKN2A, CHEK2, CTNNA1, DICER1, FANCC, FH, FLCN, GALNT12, KIF1B, LZTR1, MAX, MEN1, MET, MLH1, MSH2, MSH3, MSH6, MUTYH, NBN, NF1, NF2, NTHL1, PALB2, PHOX2B, PMS2, POT1, PRKAR1A, PTCH1, PTEN, RAD51C, RAD51D, RB1, RECQL, RET, SDHA, SDHAF2, SDHB, SDHC, SDHD, SMAD4, SMARCA4, SMARCB1, SMARCE1, STK11, SUFU, TMEM127, TP53, TSC1, TSC2, VHL and XRCC2 (sequencing and deletion/duplication); EGFR, EGLN1, HOXB13, KIT, MITF, PDGFRA, POLD1, and POLE (sequencing only); EPCAM and GREM1 (deletion/duplication only).   Based on Ms. Gossman's personal and family history of cancer, she meets medical criteria for genetic testing. Despite that she meets criteria, she may still have an out of pocket cost. We discussed that if her out of pocket cost for testing is over $100, the laboratory will call and confirm whether she wants to proceed with testing.  If the out of pocket cost of testing is less than $100 she will be billed by the genetic testing laboratory.   PLAN: After considering the risks, benefits, and limitations,  Ms. Kolinski provided informed consent to pursue genetic testing and the blood sample was sent to [facility] for analysis of the CancerNext-Expanded Panel. Results should be available within approximately 2-3 weeks' time, at which point they will be disclosed by telephone to Ms. Prezioso, as will any additional recommendations warranted by  these results. Ms. Buffalo will receive a summary of her genetic counseling visit and a copy of her results once available. This information will also be available in Epic.   Ms. Shipp questions were answered to her satisfaction today. Our contact information was provided should additional questions or concerns arise. Thank you for the referral and allowing [place] to share in the care of your patient.   Lucille Passy, MS, [facility] Genetic Counselor [email or web address] (P) [phone]  The patient was seen for a total of 35 minutes in face-to-face genetic counseling.  The patient brought her husband. Drs. Lindi Adie and/or Burr Medico were available to discuss this case as needed.   _______________________________________________________________________ For Office Staff:  Number of people involved in session: 2 Was an Intern/ student involved with case: no

## 2021-07-30 ENCOUNTER — Telehealth: Payer: Medicare Other

## 2021-08-12 ENCOUNTER — Telehealth: Payer: Self-pay | Admitting: Genetic Counselor

## 2021-08-12 ENCOUNTER — Encounter: Payer: Self-pay | Admitting: Genetic Counselor

## 2021-08-12 DIAGNOSIS — Z1379 Encounter for other screening for genetic and chromosomal anomalies: Secondary | ICD-10-CM | POA: Insufficient documentation

## 2021-08-12 NOTE — Telephone Encounter (Signed)
I attempted to contact Ms. Weider to discuss her genetic testing results (77 genes). I left a voicemail requesting she call me back at [phone].  Lucille Passy, MS, [facility] Genetic Counselor [email or web address] (P) [phone]

## 2021-08-17 ENCOUNTER — Telehealth: Payer: Self-pay | Admitting: Genetic Counselor

## 2021-08-17 NOTE — Telephone Encounter (Signed)
I contacted Ms. Uhde to discuss her genetic testing results. No pathogenic variants were identified in the 77 genes analyzed. Detailed clinic note to follow.  The test report has been scanned into EPIC and is located under the Molecular Pathology section of the Results Review tab.  A portion of the result report is included below for reference.   Lucille Passy, MS, [facility] Genetic Counselor [email or web address] (P) [phone]

## 2021-08-23 ENCOUNTER — Ambulatory Visit: Payer: Self-pay | Admitting: Genetic Counselor

## 2021-08-23 DIAGNOSIS — Z1379 Encounter for other screening for genetic and chromosomal anomalies: Secondary | ICD-10-CM

## 2021-08-23 NOTE — Progress Notes (Signed)
HPI:   Ms. Gillingham was previously seen in the [facility] clinic due to a personal and family history of cancer and concerns regarding a hereditary predisposition to cancer. Please refer to our prior cancer genetics clinic note for more information regarding our discussion, assessment and recommendations, at the time. Ms. Toulouse recent genetic test results were disclosed to her, as were recommendations warranted by these results. These results and recommendations are discussed in more detail below.  CANCER HISTORY:  Oncology History  History of breast cancer  12/18/2007 Initial Diagnosis   Right breast cancer treated with lumpectomy followed by radiation and 5 years of antiestrogen therapy with aromatase inhibitor   02/02/2016 Relapse/Recurrence   Right breast biopsy 6:00 position: IDC with DCIS grade 2, ER 100%, PR 90%, Ki-67 10%, HER-2 negative ratio 1.28, screening detected right breast lumps 0.5 cm and 0.3 cm, T1a N0 stage IA clinical stage   03/18/2016 Surgery   Right simple mastectomy: Grade 2 IDC with DCIS 1.5 cm, margins -0/3 lymph nodes, T1b N0 stage IA, ER 100%, PR 90%, Ki-67 10%, HER-2 negative ratio 1.28   03/18/2016 Oncotype testing   11/7%   04/2016 -  Anti-estrogen oral therapy   Letrozole 2.90m daily switch to exemestane 25 mg daily May 2018 switched to anastrozole 1 mg daily due to cost   03/10/2021 Surgery   Left Mastectomy : Grade 2 IDC 1.1 cm, 0/8 LN neg, ER 100%, PR: 0%, ki 67: 15%, HER 2 neg      04/2021 -  Anti-estrogen oral therapy   Letrozole daily    Genetic Testing   [facility] CancerNext-Expanded Panel was Negative. Report date is 08/09/2021.  The CancerNext-Expanded gene panel [facility] by [facility] includes sequencing, rearrangement, and RNA analysis for the following 77 genes: AIP, ALK, APC, ATM, AXIN2, BAP1, BARD1, BLM, BMPR1A, BRCA1, BRCA2, BRIP1, CDC73, CDH1, CDK4, CDKN1B, CDKN2A, CHEK2, CTNNA1, DICER1, FANCC, FH, FLCN, GALNT12, KIF1B, LZTR1,  MAX, MEN1, MET, MLH1, MSH2, MSH3, MSH6, MUTYH, NBN, NF1, NF2, NTHL1, PALB2, PHOX2B, PMS2, POT1, PRKAR1A, PTCH1, PTEN, RAD51C, RAD51D, RB1, RECQL, RET, SDHA, SDHAF2, SDHB, SDHC, SDHD, SMAD4, SMARCA4, SMARCB1, SMARCE1, STK11, SUFU, TMEM127, TP53, TSC1, TSC2, VHL and XRCC2 (sequencing and deletion/duplication); EGFR, EGLN1, HOXB13, KIT, MITF, PDGFRA, POLD1, and POLE (sequencing only); EPCAM and GREM1 (deletion/duplication only).    Malignant neoplasm of left breast in female, estrogen receptor positive ([facility]  02/18/2021 Initial Diagnosis   Malignant neoplasm of left breast in female, estrogen receptor positive ([facility]   03/10/2021 Cancer Staging   Staging form: Breast, AJCC 8th Edition - Pathologic stage from 03/10/2021: Stage IA (pT1c, pN0, cM0, G2, ER+, PR-, HER2-) - Signed by CGardenia Phlegm NP on 06/21/2021 Stage prefix: Initial diagnosis Histologic grading system: 3 grade system    Genetic Testing   [facility] CancerNext-Expanded Panel was Negative. Report date is 08/09/2021.  The CancerNext-Expanded gene panel offered by [facility] includes sequencing, rearrangement, and RNA analysis for the following 77 genes: AIP, ALK, APC, ATM, AXIN2, BAP1, BARD1, BLM, BMPR1A, BRCA1, BRCA2, BRIP1, CDC73, CDH1, CDK4, CDKN1B, CDKN2A, CHEK2, CTNNA1, DICER1, FANCC, FH, FLCN, GALNT12, KIF1B, LZTR1, MAX, MEN1, MET, MLH1, MSH2, MSH3, MSH6, MUTYH, NBN, NF1, NF2, NTHL1, PALB2, PHOX2B, PMS2, POT1, PRKAR1A, PTCH1, PTEN, RAD51C, RAD51D, RB1, RECQL, RET, SDHA, SDHAF2, SDHB, SDHC, SDHD, SMAD4, SMARCA4, SMARCB1, SMARCE1, STK11, SUFU, TMEM127, TP53, TSC1, TSC2, VHL and XRCC2 (sequencing and deletion/duplication); EGFR, EGLN1, HOXB13, KIT, MITF, PDGFRA, POLD1, and POLE (sequencing only); EPCAM and GREM1 (deletion/duplication only).  FAMILY HISTORY:  We obtained a detailed, 4-generation family history.  Significant diagnoses are listed below:      Family History  Problem Relation Age of Onset   Stroke Mother      Colon cancer Father 66   Heart disease Father     Asthma Father     Lung cancer Sister     Colon cancer Brother 47   Diabetes Brother     Breast cancer Niece          dx. <50   Breast cancer Niece          dx. <50   Breast cancer Niece          dx. >50   Esophageal cancer Neg Hx     Rectal cancer Neg Hx     Stomach cancer Neg Hx         Ms. Dickmann's maternal half-sister was diagnosed with lung cancer. This sister's daughter (Ms. Malicki's niece) was diagnosed with breast cancer at an unknown age (>50). Ms. Welp maternal half-brother was diagnosed with colon cancer at age 69, he is deceased. She has another maternal half-brother who does not have a history of cancer, but he has two daughters (Ms. Monger's nieces) who both have a history of breast cancer diagnosed under age 63. Her father was diagnosed with colon cancer at age 79, he is deceased.   Ms. Schall is unaware of previous family history of genetic testing for hereditary cancer risks. There is no reported Ashkenazi Jewish ancestry.   GENETIC TEST RESULTS:  The [facility] CancerNext-Expanded Panel found no pathogenic mutations.   The CancerNext-Expanded gene panel offered by [facility] and includes sequencing, rearrangement, and RNA analysis for the following 77 genes: AIP, ALK, APC, ATM, AXIN2, BAP1, BARD1, BLM, BMPR1A, BRCA1, BRCA2, BRIP1, CDC73, CDH1, CDK4, CDKN1B, CDKN2A, CHEK2, CTNNA1, DICER1, FANCC, FH, FLCN, GALNT12, KIF1B, LZTR1, MAX, MEN1, MET, MLH1, MSH2, MSH3, MSH6, MUTYH, NBN, NF1, NF2, NTHL1, PALB2, PHOX2B, PMS2, POT1, PRKAR1A, PTCH1, PTEN, RAD51C, RAD51D, RB1, RECQL, RET, SDHA, SDHAF2, SDHB, SDHC, SDHD, SMAD4, SMARCA4, SMARCB1, SMARCE1, STK11, SUFU, TMEM127, TP53, TSC1, TSC2, VHL and XRCC2 (sequencing and deletion/duplication); EGFR, EGLN1, HOXB13, KIT, MITF, PDGFRA, POLD1, and POLE (sequencing only); EPCAM and GREM1 (deletion/duplication only).   The test report has been scanned into EPIC and is located under the  Molecular Pathology section of the Results Review tab.  A portion of the result report is included below for reference. Genetic testing reported out on 08/09/2021.         Even though a pathogenic variant was not identified, possible explanations for the cancer in the family may include: There may be no hereditary risk for cancer in the family. The cancers in Ms. Danh and/or her family may be due to other genetic or environmental factors. There may be a gene mutation in one of these genes that current testing methods cannot detect, but that chance is small. There could be another gene that has not yet been discovered, or that we have not yet tested, that is responsible for the cancer diagnoses in the family.  It is also possible there is a hereditary cause for the cancer in the family that Ms. Brophy did not inherit.  Therefore, it is important to remain in touch with cancer genetics in the future so that we can continue to offer Ms. Pfefferkorn the most up to date genetic testing.   ADDITIONAL GENETIC TESTING:  We discussed with Ms. Archambault that her genetic testing was  fairly extensive.  If there are genes identified to increase cancer risk that can be analyzed in the future, we would be happy to discuss and coordinate this testing at that time.    CANCER SCREENING RECOMMENDATIONS:  Ms. Zundel test result is considered negative (normal).  This means that we have not identified a hereditary cause for her personal and family history of cancer at this time.   An individual's cancer risk and medical management are not determined by genetic test results alone. Overall cancer risk assessment incorporates additional factors, including personal medical history, family history, and any available genetic information that may result in a personalized plan for cancer prevention and surveillance. Therefore, it is recommended she continue to follow the cancer management and screening guidelines provided by her  oncology and primary healthcare provider.  Based on the reported personal and family history, specific cancer screenings for Ms. LARETTA PYATT and her family include:  Colon Cancer Screening: Due to Ms. Accomando's father's history of colon cancer, she is recommended to repeat colonoscopies every 5 years. More frequent colonoscopies may be recommended if polyps are identified.  RECOMMENDATIONS FOR FAMILY MEMBERS:   Individuals in this family might be at some increased risk of developing cancer, over the general population risk, due to the family history of cancer. We recommend women in this family have a yearly mammogram beginning at age 85, or 99 years younger than the earliest onset of cancer, an annual clinical breast exam, and perform monthly breast self-exams.  Other members of the family may still carry a pathogenic variant in one of these genes that Ms. Geigle did not inherit. Based on the family history, we recommend her nieces with a history of breast cancer have genetic counseling and testing.   FOLLOW-UP:  Cancer genetics is a rapidly advancing field and it is possible that new genetic tests will be appropriate for her and/or her family members in the future. We encouraged her to remain in contact with cancer genetics on an annual basis so we can update her personal and family histories and let her know of advances in cancer genetics that may benefit this family.   Our contact number was provided. Ms. Mcquown questions were answered to her satisfaction, and she knows she is welcome to call us at anytime with additional questions or concerns.   Lucille Passy, MS, [facility] Genetic Counselor [email or web address] (P) [phone]

## 2021-08-24 ENCOUNTER — Encounter: Payer: Self-pay | Admitting: Genetic Counselor

## 2021-08-25 ENCOUNTER — Other Ambulatory Visit: Payer: Self-pay | Admitting: Internal Medicine

## 2021-08-26 NOTE — Telephone Encounter (Signed)
Requested Prescriptions  Pending Prescriptions Disp Refills  . pravastatin (PRAVACHOL) 40 MG tablet [Pharmacy Med Name: Pravastatin Sodium 40 MG Oral Tablet] 90 tablet 1    Sig: TAKE 1 TABLET BY MOUTH DAILY     Cardiovascular:  Antilipid - Statins Failed - 08/25/2021 10:06 PM      Failed - Lipid Panel in normal range within the last 12 months    Cholesterol, Total  Date Value Ref Range Status  10/17/2014 179 100 - 199 mg/dL Final   Cholesterol  Date Value Ref Range Status  01/12/2021 173 <200 mg/dL Final   LDL Cholesterol (Calc)  Date Value Ref Range Status  01/12/2021 86 mg/dL (calc) Final    Comment:    Reference range: <100 . Desirable range <100 mg/dL for primary prevention;   <70 mg/dL for patients with CHD or diabetic patients  with > or = 2 CHD risk factors. Marland Kitchen LDL-C is now calculated using the Martin-Hopkins  calculation, which is a validated novel method providing  better accuracy than the Friedewald equation in the  estimation of LDL-C.  Cresenciano Genre et al. Annamaria Helling. 7353;299(24): 2061-2068  (http://education.[facility].com/faq/FAQ164)    Direct LDL  Date Value Ref Range Status  09/05/2013 169.6 mg/dL Final    Comment:    Optimal:  <100 mg/dLNear or Above Optimal:  100-129 mg/dLBorderline High:  130-159 mg/dLHigh:  160-189 mg/dLVery High:  >190 mg/dL   HDL  Date Value Ref Range Status  01/12/2021 62 > OR = 50 mg/dL Final  10/17/2014 59 >39 mg/dL Final    Comment:    According to ATP-III Guidelines, HDL-C >59 mg/dL is considered a negative risk factor for CHD.    Triglycerides  Date Value Ref Range Status  01/12/2021 148 <150 mg/dL Final         Passed - Patient is not pregnant      Passed - Valid encounter within last 12 months    Recent Outpatient Visits          1 month ago Type 2 diabetes mellitus with hyperglycemia, without long-term current use of insulin [facility])   [facility], Coralie Keens, NP   2 months ago Aortic  atherosclerosis [facility])   [facility], Coralie Keens, NP   2 months ago Primary hypertension   , Virl Diamond, RPH-CPP   3 months ago Primary hypertension   [place], Virl Diamond, RPH-CPP   3 months ago Primary hypertension   [place], [place]

## 2021-08-31 DIAGNOSIS — D631 Anemia in chronic kidney disease: Secondary | ICD-10-CM | POA: Diagnosis not present

## 2021-08-31 DIAGNOSIS — I129 Hypertensive chronic kidney disease with stage 1 through stage 4 chronic kidney disease, or unspecified chronic kidney disease: Secondary | ICD-10-CM | POA: Diagnosis not present

## 2021-08-31 DIAGNOSIS — E1122 Type 2 diabetes mellitus with diabetic chronic kidney disease: Secondary | ICD-10-CM | POA: Diagnosis not present

## 2021-08-31 DIAGNOSIS — N1831 Chronic kidney disease, stage 3a: Secondary | ICD-10-CM | POA: Diagnosis not present

## 2021-09-09 DIAGNOSIS — I129 Hypertensive chronic kidney disease with stage 1 through stage 4 chronic kidney disease, or unspecified chronic kidney disease: Secondary | ICD-10-CM | POA: Diagnosis not present

## 2021-09-09 DIAGNOSIS — N1831 Chronic kidney disease, stage 3a: Secondary | ICD-10-CM | POA: Diagnosis not present

## 2021-09-10 ENCOUNTER — Other Ambulatory Visit: Payer: Self-pay | Admitting: Internal Medicine

## 2021-09-13 NOTE — Telephone Encounter (Signed)
Requested Prescriptions  Pending Prescriptions Disp Refills  . irbesartan (AVAPRO) 300 MG tablet [Pharmacy Med Name: Irbesartan 300 MG Oral Tablet] 90 tablet 0    Sig: TAKE 1 TABLET BY MOUTH DAILY     Cardiovascular:  Angiotensin Receptor Blockers Failed - 09/10/2021 10:35 PM      Failed - Cr in normal range and within 180 days    Creat  Date Value Ref Range Status  06/17/2021 1.12 (H) 0.50 - 1.05 mg/dL Final   Creatinine,U  Date Value Ref Range Status  04/03/2014 447.0 mg/dL Final   Creatinine, Urine  Date Value Ref Range Status  01/12/2021 423 (H) 20 - 275 mg/dL Final         Passed - K in normal range and within 180 days    Potassium  Date Value Ref Range Status  06/17/2021 4.2 3.5 - 5.3 mmol/L Final  10/12/2013 3.9 3.5 - 5.1 mmol/L Final         Passed - Patient is not pregnant      Passed - Last BP in normal range    BP Readings from Last 1 Encounters:  07/06/21 138/78         Passed - Valid encounter within last 6 months    Recent Outpatient Visits          2 months ago Type 2 diabetes mellitus with hyperglycemia, without long-term current use of insulin [facility])   [facility] [place], Coralie Keens, NP   2 months ago Aortic atherosclerosis [facility])   [facility] Jearld Fenton, NP   3 months ago Primary hypertension   [place], Virl Diamond, RPH-CPP   3 months ago Primary hypertension   [place], Virl Diamond, RPH-CPP   4 months ago Primary hypertension   [place], [place]

## 2021-09-19 ENCOUNTER — Other Ambulatory Visit: Payer: Self-pay | Admitting: Internal Medicine

## 2021-09-22 ENCOUNTER — Other Ambulatory Visit: Payer: Self-pay | Admitting: Internal Medicine

## 2021-09-22 DIAGNOSIS — I10 Essential (primary) hypertension: Secondary | ICD-10-CM

## 2021-09-22 NOTE — Telephone Encounter (Signed)
Requested Prescriptions  Pending Prescriptions Disp Refills  . hydrALAZINE (APRESOLINE) 100 MG tablet [Pharmacy Med Name: hydrALAZINE HCl 100 MG Oral Tablet] 270 tablet 2    Sig: TAKE 1 TABLET BY MOUTH 3 TIMES  DAILY     Cardiovascular:  Vasodilators Failed - 09/19/2021 11:01 PM      Failed - ANA Screen, Ifa, Serum in normal range and within 360 days    Anti Nuclear Antibody(ANA)  Date Value Ref Range Status  10/17/2014 Negative Negative Final         Passed - HCT in normal range and within 360 days    HCT  Date Value Ref Range Status  01/12/2021 38.4 35.0 - 45.0 % Final  10/12/2013 40.5 35.0 - 47.0 % Final  08/13/2009 36.8 34.8 - 46.6 % Final         Passed - HGB in normal range and within 360 days    Hemoglobin  Date Value Ref Range Status  01/12/2021 12.5 11.7 - 15.5 g/dL Final   HGB  Date Value Ref Range Status  10/12/2013 13.5 12.0 - 16.0 g/dL Final  08/13/2009 12.8 11.6 - 15.9 g/dL Final         Passed - RBC in normal range and within 360 days    RBC  Date Value Ref Range Status  01/12/2021 4.18 3.80 - 5.10 Million/uL Final         Passed - WBC in normal range and within 360 days    WBC  Date Value Ref Range Status  01/12/2021 3.9 3.8 - 10.8 Thousand/uL Final         Passed - PLT in normal range and within 360 days    Platelets  Date Value Ref Range Status  01/12/2021 250 140 - 400 Thousand/uL Final  08/13/2009 271 145 - 400 10e3/uL Final   Platelet  Date Value Ref Range Status  10/12/2013 278 150 - 440 x10 3/mm 3 Final         Passed - Last BP in normal range    BP Readings from Last 1 Encounters:  07/06/21 138/78         Passed - Valid encounter within last 12 months    Recent Outpatient Visits          2 months ago Type 2 diabetes mellitus with hyperglycemia, without long-term current use of insulin ([place])   [facility] Jearld Fenton, NP   3 months ago Aortic atherosclerosis [facility])   [facility] Jearld Fenton,  NP   3 months ago Primary hypertension   [place], Virl Diamond, RPH-CPP   3 months ago Primary hypertension   [place], Virl Diamond, RPH-CPP   4 months ago Primary hypertension   [place], RPH-CPP

## 2021-09-23 NOTE — Telephone Encounter (Signed)
Last RF 06/17/21 #90 1 RF  Requested Prescriptions  Refused Prescriptions Disp Refills  . amLODipine (NORVASC) 10 MG tablet [Pharmacy Med Name: AMLODIPINE BESYLATE '10MG'$  TABLETS] 90 tablet 1    Sig: TAKE 1 TABLET(10 MG) BY MOUTH DAILY     Cardiovascular: Calcium Channel Blockers 2 Passed - 09/22/2021  8:05 AM      Passed - Last BP in normal range    BP Readings from Last 1 Encounters:  07/06/21 138/78         Passed - Last Heart Rate in normal range    Pulse Readings from Last 1 Encounters:  07/06/21 72         Passed - Valid encounter within last 6 months    Recent Outpatient Visits          2 months ago Type 2 diabetes mellitus with hyperglycemia, without long-term current use of insulin [facility])   [facility] [place], Coralie Keens, NP   3 months ago Aortic atherosclerosis [facility])   [facility] [place], Coralie Keens, NP   3 months ago Primary hypertension   [place], Virl Diamond, RPH-CPP   4 months ago Primary hypertension   [place], Virl Diamond, RPH-CPP   4 months ago Primary hypertension   [place], RPH-CPP

## 2021-09-27 DIAGNOSIS — Z170 Estrogen receptor positive status [ER+]: Secondary | ICD-10-CM | POA: Diagnosis not present

## 2021-09-27 DIAGNOSIS — C50412 Malignant neoplasm of upper-outer quadrant of left female breast: Secondary | ICD-10-CM | POA: Diagnosis not present

## 2021-09-30 ENCOUNTER — Encounter: Payer: Self-pay | Admitting: Internal Medicine

## 2021-10-06 ENCOUNTER — Encounter (HOSPITAL_BASED_OUTPATIENT_CLINIC_OR_DEPARTMENT_OTHER): Payer: Self-pay

## 2021-10-06 ENCOUNTER — Emergency Department (HOSPITAL_BASED_OUTPATIENT_CLINIC_OR_DEPARTMENT_OTHER): Payer: Medicare Other

## 2021-10-06 ENCOUNTER — Emergency Department (HOSPITAL_BASED_OUTPATIENT_CLINIC_OR_DEPARTMENT_OTHER)
Admission: EM | Admit: 2021-10-06 | Discharge: 2021-10-06 | Disposition: A | Discharge status: Home / Self Care | Payer: Medicare Other | Attending: Emergency Medicine | Admitting: Emergency Medicine

## 2021-10-06 ENCOUNTER — Other Ambulatory Visit: Payer: Self-pay

## 2021-10-06 DIAGNOSIS — R5383 Other fatigue: Secondary | ICD-10-CM | POA: Insufficient documentation

## 2021-10-06 DIAGNOSIS — Z79899 Other long term (current) drug therapy: Secondary | ICD-10-CM | POA: Diagnosis not present

## 2021-10-06 DIAGNOSIS — Z7984 Long term (current) use of oral hypoglycemic drugs: Secondary | ICD-10-CM | POA: Diagnosis not present

## 2021-10-06 DIAGNOSIS — M542 Cervicalgia: Secondary | ICD-10-CM | POA: Diagnosis not present

## 2021-10-06 DIAGNOSIS — Z853 Personal history of malignant neoplasm of breast: Secondary | ICD-10-CM | POA: Diagnosis not present

## 2021-10-06 DIAGNOSIS — E119 Type 2 diabetes mellitus without complications: Secondary | ICD-10-CM | POA: Diagnosis not present

## 2021-10-06 DIAGNOSIS — I10 Essential (primary) hypertension: Secondary | ICD-10-CM | POA: Diagnosis not present

## 2021-10-06 DIAGNOSIS — R079 Chest pain, unspecified: Secondary | ICD-10-CM | POA: Diagnosis not present

## 2021-10-06 LAB — BASIC METABOLIC PANEL
Anion gap: 7 (ref 5–15)
BUN: 18 mg/dL (ref 8–23)
CO2: 28 mmol/L (ref 22–32)
Calcium: 9.7 mg/dL (ref 8.9–10.3)
Chloride: 104 mmol/L (ref 98–111)
Creatinine, Ser: 1.07 mg/dL — ABNORMAL HIGH (ref 0.44–1.00)
GFR, Estimated: 57 mL/min — ABNORMAL LOW (ref >60)
Glucose, Bld: 114 mg/dL — ABNORMAL HIGH (ref 70–99)
Potassium: 3.6 mmol/L (ref 3.5–5.1)
Sodium: 139 mmol/L (ref 135–145)

## 2021-10-06 LAB — CBC
HCT: 37.6 % (ref 36.0–46.0)
Hemoglobin: 12.5 g/dL (ref 12.0–15.0)
MCH: 29.6 pg (ref 26.0–34.0)
MCHC: 33.2 g/dL (ref 30.0–36.0)
MCV: 89.1 fL (ref 80.0–100.0)
Platelets: 252 10*3/uL (ref 150–400)
RBC: 4.22 MIL/uL (ref 3.87–5.11)
RDW: 13.1 % (ref 11.5–15.5)
WBC: 4 10*3/uL (ref 4.0–10.5)
nRBC: 0 % (ref 0.0–0.2)

## 2021-10-06 LAB — D-DIMER, QUANTITATIVE: D-Dimer, Quant: 0.35 ug/mL-FEU (ref 0.00–0.50)

## 2021-10-06 NOTE — ED Triage Notes (Signed)
Pt arrived POV c/o left sided head, neck, shoulder, arm pain x 1 week, intermittent nagging pain, worse with movement. Pt hasn't tried any meds for pain.

## 2021-10-06 NOTE — Discharge Instructions (Signed)
You were seen in the emergency department today for left-sided headache, neck pain, and arm pain.  As we discussed your lab work, chest x-ray, and EKG were all reassuring today.  I recommend taking Tylenol as needed for pain, and this may help with some lymph node swelling as well.  It is incredibly important you mention your symptoms to your oncologist, so they can follow-up with you as needed.  Continue to monitor how you're doing and return to the ER for new or worsening symptoms.

## 2021-10-06 NOTE — ED Provider Notes (Signed)
[facility] EMERGENCY DEPARTMENT Provider Note   CSN: [id number] Arrival date & time: 10/06/21  5009     History  Chief Complaint  Patient presents with   Neck Pain    With movement    [facility] is a 66 y.o. female with history of diabetes, hypertension, hyperlipidemia, breast cancer, GERD who presents the emergency department complaining of left-sided head, neck, shoulder, and arm pain for the past week.  Denies any trauma to the area.  States that the pain is intermittent and "nagging".  Exacerbated by movement.  Has not tried anything for the pain.  Patient states that she has had similar symptoms along with debilitating fatigue for the past several months.  Had a left-sided mastectomy in February, required physical therapy afterwards to assist with moving her left arm.  States that she has not mentioned the symptoms to her oncologist.  Symptoms only got worse in the past week.  Also been having intermittent chills, no fever.   Neck Pain Associated symptoms: no chest pain and no numbness        Home Medications Prior to Admission medications   Medication Sig Start Date End Date Taking? Authorizing Provider  amLODipine (NORVASC) 10 MG tablet Take 1 tablet (10 mg total) by mouth daily. 06/17/21   Jearld Fenton, NP  Biotin w/ Vitamins C & E (HAIR SKIN & NAILS GUMMIES PO) Take by mouth.    [provider]  calcium carbonate (OSCAL) 1500 (600 Ca) MG TABS tablet Take by mouth 2 (two) times daily with a meal.    [provider]  carvedilol (COREG) 12.5 MG tablet Take 12.5 mg by mouth in the morning and at bedtime. 04/30/19   [provider]  hydrALAZINE (APRESOLINE) 100 MG tablet TAKE 1 TABLET BY MOUTH 3 TIMES  DAILY 09/22/21   Jearld Fenton, NP  irbesartan (AVAPRO) 300 MG tablet TAKE 1 TABLET BY MOUTH DAILY 09/13/21   Jearld Fenton, NP  Lancets (ONETOUCH DELICA PLUS [id number]) [place] 02/24/21   Jearld Fenton, NP   letrozole [facility]) 2.5 MG tablet Take 1 tablet (2.5 mg total) by mouth daily. 04/02/21   Nicholas Lose, MD  metFORMIN (GLUCOPHAGE) 1000 MG tablet Take 1,000 mg by mouth 2 (two) times daily with a meal.    [provider]  Multiple Vitamins-Minerals (ONE-A-DAY WOMENS VITACRAVES) CHEW Chew 1 tablet by mouth daily.     [provider]  [facility] ULTRA test strip USE TWICE DAILY AS DIRECTED 07/27/21   Jearld Fenton, NP  pravastatin (PRAVACHOL) 40 MG tablet TAKE 1 TABLET BY MOUTH DAILY 08/26/21   Jearld Fenton, NP  repaglinide (PRANDIN) 0.5 MG tablet Take 0.5 mg by mouth 3 (three) times daily before meals.    [provider]      Allergies    Patient has no known allergies.    Review of Systems   Review of Systems  Constitutional:  Positive for chills and fatigue.  Respiratory:  Negative for cough and shortness of breath.   Cardiovascular:  Negative for chest pain.  Musculoskeletal:  Positive for neck pain.  Neurological:  Negative for numbness.  All other systems reviewed and are negative.   Physical Exam Updated Vital Signs BP (!) 145/82   Pulse 75   Temp 98.2 F (36.8 C) (Oral)   Resp 13   Ht '5\' 5"'$  (1.651 m)   Wt 95.3 kg   SpO2 100%  BMI 34.95 kg/m  Physical Exam Vitals and nursing note reviewed.  Constitutional:      Appearance: Normal appearance.     Comments: Appears fatigued  HENT:     Head: Normocephalic and atraumatic.  Eyes:     Conjunctiva/sclera: Conjunctivae normal.  Neck:     Thyroid: No thyroid mass.     Vascular: No JVD.     Trachea: Trachea normal.     Comments: Left sided muscular tenderness Cardiovascular:     Rate and Rhythm: Normal rate and regular rhythm.  Pulmonary:     Effort: Pulmonary effort is normal. No respiratory distress.     Breath sounds: Normal breath sounds.  Abdominal:     General: There is no distension.     Palpations: Abdomen is soft.     Tenderness: There is no abdominal tenderness.   Musculoskeletal:     Cervical back: Neck supple. No torticollis. Muscular tenderness present. No spinous process tenderness.  Lymphadenopathy:     Cervical: Cervical adenopathy present.     Left cervical: Superficial cervical adenopathy present.  Skin:    General: Skin is warm and dry.  Neurological:     General: No focal deficit present.     Mental Status: She is alert.     ED Results / Procedures / Treatments   Labs (all labs ordered are listed, but only abnormal results are displayed) Labs Reviewed  BASIC METABOLIC PANEL - Abnormal; Notable for the following components:      Result Value   Glucose, Bld 114 (*)    Creatinine, Ser 1.07 (*)    GFR, Estimated 57 (*)    All other components within normal limits  CBC  D-DIMER, QUANTITATIVE    EKG EKG Interpretation  Date/Time:  Wednesday October 06 2021 08:28:57 EDT Ventricular Rate:  69 PR Interval:  197 QRS Duration: 152 QT Interval:  431 QTC Calculation: 462 R Axis:   -45 Text Interpretation: Sinus rhythm RBBB and LAFB No significant change since last tracing Confirmed by Dorie Rank [phone]) on 10/06/2021 8:31:16 AM  Radiology DG Chest 2 View  Result Date: 10/06/2021 CLINICAL DATA:  Left-sided chest pain worsening with movement. EXAM: CHEST - 2 VIEW COMPARISON:  05/31/2017 FINDINGS: Heart size is normal. Aortic atherosclerotic calcification is noted. The lungs are clear. The vascularity is normal. No effusions. No abnormal bone finding. IMPRESSION: No active disease.  Aortic atherosclerotic calcification. Electronically Signed   By: Nelson Chimes M.D.   On: 10/06/2021 10:23    Procedures Procedures    Medications Ordered in ED Medications - No data to display  ED Course/ Medical Decision Making/ A&P                           Medical Decision Making This patient is a 66 y.o. female  who presents to the ED for concern of left sided head, neck, and upper chest pain for several months, worse in the past week.    Differential diagnoses prior to evaluation: The emergent differential diagnosis includes, but is not limited to,  viral illness, lymphadenitis, cervical radiculopathy, PE, malignancy.  This is not an exhaustive differential.   Past Medical History / Co-morbidities: diabetes, hypertension, hyperlipidemia, breast cancer s/p left sided mastectomy in Feb 2023, GERD  Additional history: Chart reviewed. Pertinent results include: Last saw oncologist in June for follow up post completion of breast cancer treatment, where patient was complaining of fatigue. Provider suspected it was related to  anti-estrogen therapy.   Physical Exam: Physical exam performed. The pertinent findings include: Patient appears fatigued. Hypertensive but otherwise normal vital signs. Slight left sided superficial cervical lymphadenopathy. Some left sided muscular tenderness to palpation of the neck.   Lab Tests/Imaging studies: I personally interpreted labs/imaging and the pertinent results include: CBC unremarkable, BMP with kidney function stable compared to prior.  Negative D-dimer.  Chest x-ray without acute cardiopulmonary abnormalities.. I agree with the radiologist interpretation.  Cardiac monitoring: EKG obtained and interpreted by my attending physician which shows: sinus rhythm with RBBB, unchanged from prior  Disposition: After consideration of the diagnostic results and the patients response to treatment, I feel that emergency department workup does not suggest an emergent condition requiring admission or immediate intervention beyond what has been performed at this time. The plan is: Discharge home with symptomatic management of left-sided muscular pain.  Strongly recommended follow-up with oncology as I do have some concern for recurrence of malignancy. The patient is safe for discharge and has been instructed to return immediately for worsening symptoms, change in symptoms or any other concerns.   I  discussed this case with my attending physician Dr. Tomi Bamberger who cosigned this note including patient's presenting symptoms, physical exam, and planned diagnostics and interventions. Attending physician stated agreement with plan or made changes to plan which were implemented.   Final Clinical Impression(s) / ED Diagnoses Final diagnoses:  Neck pain on left side  Fatigue, unspecified type    Rx / DC Orders ED Discharge Orders     None      Portions of this report may have been transcribed using voice recognition software. Every effort was made to ensure accuracy; however, inadvertent computerized transcription errors may be present.    Estill Cotta 10/06/21 1120    Dorie Rank, MD 10/07/21 [phone]

## 2021-10-12 ENCOUNTER — Other Ambulatory Visit: Payer: Self-pay

## 2021-10-12 ENCOUNTER — Emergency Department (HOSPITAL_BASED_OUTPATIENT_CLINIC_OR_DEPARTMENT_OTHER)
Admission: EM | Admit: 2021-10-12 | Discharge: 2021-10-13 | Disposition: A | Discharge status: Home / Self Care | Payer: Medicare Other | Attending: Emergency Medicine | Admitting: Emergency Medicine

## 2021-10-12 ENCOUNTER — Encounter (HOSPITAL_BASED_OUTPATIENT_CLINIC_OR_DEPARTMENT_OTHER): Payer: Self-pay | Admitting: Emergency Medicine

## 2021-10-12 DIAGNOSIS — N189 Chronic kidney disease, unspecified: Secondary | ICD-10-CM | POA: Insufficient documentation

## 2021-10-12 DIAGNOSIS — R509 Fever, unspecified: Secondary | ICD-10-CM | POA: Diagnosis present

## 2021-10-12 DIAGNOSIS — Z853 Personal history of malignant neoplasm of breast: Secondary | ICD-10-CM | POA: Diagnosis not present

## 2021-10-12 DIAGNOSIS — I129 Hypertensive chronic kidney disease with stage 1 through stage 4 chronic kidney disease, or unspecified chronic kidney disease: Secondary | ICD-10-CM | POA: Diagnosis not present

## 2021-10-12 DIAGNOSIS — U071 COVID-19: Secondary | ICD-10-CM | POA: Insufficient documentation

## 2021-10-12 DIAGNOSIS — E1122 Type 2 diabetes mellitus with diabetic chronic kidney disease: Secondary | ICD-10-CM | POA: Insufficient documentation

## 2021-10-12 DIAGNOSIS — Z7984 Long term (current) use of oral hypoglycemic drugs: Secondary | ICD-10-CM | POA: Insufficient documentation

## 2021-10-12 DIAGNOSIS — Z79899 Other long term (current) drug therapy: Secondary | ICD-10-CM | POA: Diagnosis not present

## 2021-10-12 LAB — SARS CORONAVIRUS 2 BY RT PCR: SARS Coronavirus 2 by RT PCR: POSITIVE — AB

## 2021-10-12 MED ORDER — SODIUM CHLORIDE 0.9 % IV BOLUS
500.0000 mL | Freq: Once | INTRAVENOUS | Status: AC
  Administered 2021-10-13: 500 mL via INTRAVENOUS

## 2021-10-12 MED ORDER — ACETAMINOPHEN 500 MG PO TABS
1000.0000 mg | ORAL_TABLET | Freq: Once | ORAL | Status: AC
  Administered 2021-10-12: 1000 mg via ORAL
  Filled 2021-10-12: qty 2

## 2021-10-12 NOTE — ED Triage Notes (Signed)
Pt with fever, HA, cough, runny nose that started today

## 2021-10-12 NOTE — ED Provider Notes (Signed)
[facility] Provider Note   CSN: [id number] Arrival date & time: 10/12/21  2236     History {Add pertinent medical, surgical, social history, OB history to HPI:1} Chief Complaint  Patient presents with   Fever    Carrie Singh is a 66 y.o. female.  The history is provided by the patient, medical records and the spouse.  Fever Carrie Singh is a 66 y.o. female who presents to the Emergency Department complaining of *** Chills, body aches, ha.  Started today.    No vomiting.  No chest pain.  No dysuria.  No sick contacts.    Dm, htn, ckd, breast cancer.       Home Medications Prior to Admission medications   Medication Sig Start Date End Date Taking? Authorizing Provider  amLODipine (NORVASC) 10 MG tablet Take 1 tablet (10 mg total) by mouth daily. 06/17/21   Jearld Fenton, NP  Biotin w/ Vitamins C & E (HAIR SKIN & NAILS GUMMIES PO) Take by mouth.    [provider]  calcium carbonate (OSCAL) 1500 (600 Ca) MG TABS tablet Take by mouth 2 (two) times daily with a meal.    [provider]  carvedilol (COREG) 12.5 MG tablet Take 12.5 mg by mouth in the morning and at bedtime. 04/30/19   [provider]  hydrALAZINE (APRESOLINE) 100 MG tablet TAKE 1 TABLET BY MOUTH 3 TIMES  DAILY 09/22/21   Jearld Fenton, NP  irbesartan (AVAPRO) 300 MG tablet TAKE 1 TABLET BY MOUTH DAILY 09/13/21   Jearld Fenton, NP  Lancets (ONETOUCH DELICA PLUS [id number]) [place] 02/24/21   Jearld Fenton, NP  letrozole [facility]) 2.5 MG tablet Take 1 tablet (2.5 mg total) by mouth daily. 04/02/21   Nicholas Lose, MD  metFORMIN (GLUCOPHAGE) 1000 MG tablet Take 1,000 mg by mouth 2 (two) times daily with a meal.    [provider]  Multiple Vitamins-Minerals (ONE-A-DAY WOMENS VITACRAVES) CHEW Chew 1 tablet by mouth daily.     [provider]  [facility] ULTRA test strip USE TWICE DAILY AS DIRECTED 07/27/21   Jearld Fenton, NP  pravastatin (PRAVACHOL) 40 MG tablet TAKE 1 TABLET BY MOUTH DAILY 08/26/21   Jearld Fenton, NP  repaglinide (PRANDIN) 0.5 MG tablet Take 0.5 mg by mouth 3 (three) times daily before meals.    [provider]      Allergies    Patient has no known allergies.    Review of Systems   Review of Systems  Constitutional:  Positive for fever.    Physical Exam Updated Vital Signs BP (!) 137/94 (BP Location: Left Arm)   Pulse (!) 104   Temp (!) 102.7 F (39.3 C) (Oral)   Resp 20   Ht '5\' 5"'$  (1.651 m)   Wt 95.7 kg   SpO2 99%   BMI 35.11 kg/m  Physical Exam  ED Results / Procedures / Treatments   Labs (all labs ordered are listed, but only abnormal results are displayed) Labs Reviewed  SARS CORONAVIRUS 2 BY RT PCR - Abnormal; Notable for the following components:      Result Value   SARS Coronavirus 2 by RT PCR POSITIVE (*)    All other components within normal limits    EKG None  Radiology No results found.  Procedures Procedures  {Document cardiac monitor, telemetry assessment procedure when appropriate:1}  Medications Ordered in ED Medications  acetaminophen (TYLENOL)  tablet 1,000 mg (1,000 mg Oral Given 10/12/21 2259)    ED Course/ Medical Decision Making/ A&P                           Medical Decision Making Risk OTC drugs.   ***  {Document critical care time when appropriate:1} {Document review of labs and clinical decision tools ie heart score, Chads2Vasc2 etc:1}  {Document your independent review of radiology images, and any outside records:1} {Document your discussion with family members, caretakers, and with consultants:1} {Document social determinants of health affecting pt's care:1} {Document your decision making why or why not admission, treatments were needed:1} Final Clinical Impression(s) / ED Diagnoses Final diagnoses:  None    Rx / DC Orders ED Discharge Orders     None

## 2021-10-13 LAB — CBC WITH DIFFERENTIAL/PLATELET
Abs Immature Granulocytes: 0.01 10*3/uL (ref 0.00–0.07)
Basophils Absolute: 0 10*3/uL (ref 0.0–0.1)
Basophils Relative: 1 %
Eosinophils Absolute: 0 10*3/uL (ref 0.0–0.5)
Eosinophils Relative: 0 %
HCT: 34 % — ABNORMAL LOW (ref 36.0–46.0)
Hemoglobin: 11.7 g/dL — ABNORMAL LOW (ref 12.0–15.0)
Immature Granulocytes: 0 %
Lymphocytes Relative: 15 %
Lymphs Abs: 0.8 10*3/uL (ref 0.7–4.0)
MCH: 30 pg (ref 26.0–34.0)
MCHC: 34.4 g/dL (ref 30.0–36.0)
MCV: 87.2 fL (ref 80.0–100.0)
Monocytes Absolute: 0.7 10*3/uL (ref 0.1–1.0)
Monocytes Relative: 13 %
Neutro Abs: 3.7 10*3/uL (ref 1.7–7.7)
Neutrophils Relative %: 71 %
Platelets: 219 10*3/uL (ref 150–400)
RBC: 3.9 MIL/uL (ref 3.87–5.11)
RDW: 12.9 % (ref 11.5–15.5)
WBC: 5.2 10*3/uL (ref 4.0–10.5)
nRBC: 0 % (ref 0.0–0.2)

## 2021-10-13 LAB — COMPREHENSIVE METABOLIC PANEL
ALT: 15 U/L (ref 0–44)
AST: 18 U/L (ref 15–41)
Albumin: 3.8 g/dL (ref 3.5–5.0)
Alkaline Phosphatase: 48 U/L (ref 38–126)
Anion gap: 7 (ref 5–15)
BUN: 14 mg/dL (ref 8–23)
CO2: 25 mmol/L (ref 22–32)
Calcium: 9.1 mg/dL (ref 8.9–10.3)
Chloride: 106 mmol/L (ref 98–111)
Creatinine, Ser: 1.06 mg/dL — ABNORMAL HIGH (ref 0.44–1.00)
GFR, Estimated: 57 mL/min — ABNORMAL LOW (ref >60)
Glucose, Bld: 91 mg/dL (ref 70–99)
Potassium: 3.6 mmol/L (ref 3.5–5.1)
Sodium: 138 mmol/L (ref 135–145)
Total Bilirubin: 1.1 mg/dL (ref 0.3–1.2)
Total Protein: 6.9 g/dL (ref 6.5–8.1)

## 2021-10-13 MED ORDER — MOLNUPIRAVIR 200 MG PO CAPS: 4.0000 | ORAL_CAPSULE | Freq: Two times a day (BID) | ORAL | 0 refills | Status: AC

## 2021-11-25 ENCOUNTER — Ambulatory Visit
Admission: RE | Admit: 2021-11-25 | Discharge: 2021-11-25 | Disposition: A | Discharge status: Home / Self Care | Payer: Medicare Other | Attending: Internal Medicine | Admitting: Internal Medicine

## 2021-11-25 ENCOUNTER — Encounter: Payer: Self-pay | Admitting: Internal Medicine

## 2021-11-25 ENCOUNTER — Ambulatory Visit
Admission: RE | Admit: 2021-11-25 | Discharge: 2021-11-25 | Disposition: A | Discharge status: Home / Self Care | Payer: Medicare Other | Source: Ambulatory Visit | Attending: Internal Medicine | Admitting: Internal Medicine

## 2021-11-25 ENCOUNTER — Ambulatory Visit (INDEPENDENT_AMBULATORY_CARE_PROVIDER_SITE_OTHER): Payer: Medicare Other | Admitting: Internal Medicine

## 2021-11-25 VITALS — BP 126/82 | HR 89 | Temp 96.9°F | Wt 213.0 lb

## 2021-11-25 DIAGNOSIS — M25551 Pain in right hip: Secondary | ICD-10-CM | POA: Insufficient documentation

## 2021-11-25 DIAGNOSIS — E1122 Type 2 diabetes mellitus with diabetic chronic kidney disease: Secondary | ICD-10-CM | POA: Diagnosis not present

## 2021-11-25 DIAGNOSIS — G8929 Other chronic pain: Secondary | ICD-10-CM

## 2021-11-25 DIAGNOSIS — M25562 Pain in left knee: Secondary | ICD-10-CM | POA: Diagnosis not present

## 2021-11-25 DIAGNOSIS — N1831 Chronic kidney disease, stage 3a: Secondary | ICD-10-CM | POA: Diagnosis not present

## 2021-11-25 DIAGNOSIS — Z6835 Body mass index (BMI) 35.0-35.9, adult: Secondary | ICD-10-CM

## 2021-11-25 MED ORDER — PREDNISONE 10 MG PO TABS: ORAL_TABLET | ORAL | 0 refills | Status: DC

## 2021-11-25 NOTE — Assessment & Plan Note (Signed)
Encourage weight loss as this can help reduce joint pain

## 2021-11-25 NOTE — Patient Instructions (Signed)
Joint Pain Joint pain may be caused by many things. Joint pain is likely to go away when you follow instructions from your health care provider for relieving pain at home. However, joint pain can also be caused by conditions that require more treatment. Common causes of joint pain include: Bruising in the area of the joint. Injury caused by repeating certain movements too many times. Age-related joint wear and tear. Buildup of uric acid crystals in the joint (gout). Inflammation of the joint. Other forms of arthritis. Infections of the joint or of the bone. Your health care provider may recommend that you take pain medicine or wear a supportive device like an elastic bandage, sling, or splint. If your joint pain continues, you may need lab or imaging tests to diagnose the cause of your joint pain. Follow these instructions at home: Managing pain, stiffness, and swelling     If directed, put ice on the painful area. To do this: If you have a removable elastic bandage, sling, or splint, take it off as told by your doctor. Put ice in a plastic bag. Place a towel between your skin and the bag. Leave the ice on for 20 minutes, 2-3 times a day. Remove the ice if your skin turns bright red. This is very important. If you cannot feel pain, heat, or cold, you have a greater risk of damage to the area. Move your fingers and toes often to reduce stiffness and swelling. Raise the injured area above the level of your heart while you are sitting or lying down. If directed, apply heat to the painful area as often as told by your health care provider. Use the heat source that your health care provider recommends, such as a moist heat pack or a heating pad. Place a towel between your skin and the heat source. Leave the heat on for 20-30 minutes. Remove the heat if your skin turns bright red. This is especially important if you are unable to feel pain, heat, or cold. You have a greater risk of getting  burned.  Activity Rest as told by your health care provider. Do not do anything that causes or worsens pain. Begin exercising or stretching the affected area as told by your health care provider. Return to your normal activities as told by your health care provider. Ask your health care provider what activities are safe for you. If you have an elastic bandage, sling, or splint: Wear the bandage, sling, or splint as told by your health care provider. Remove it only as told by your health care provider. Loosen it if your fingers or toes below the joint tingle, become numb, or turn cold and blue. Keep it clean. Ask your health care provider if you should remove it before bathing. If the bandage, sling, or splint is not waterproof: Do not let it get wet. Cover it with a watertight covering when you take a bath or shower. General instructions Treatment may include medicines for pain and inflammation that are taken by mouth or applied to the skin. Take over-the-counter and prescription medicines only as told by your health care provider. Do not use any products that contain nicotine or tobacco, such as cigarettes, e-cigarettes, and chewing tobacco. If you need help quitting, ask your health care provider. Keep all follow-up visits. This is important. Contact a health care provider if: You have pain that gets worse and does not get better with medicine. Your joint pain does not improve within 3 days. You have increased   bruising or swelling. You have a fever. You lose 10 lb (4.5 kg) or more without trying. Get help right away if: You cannot move the joint. Your fingers or toes tingle, become numb, or turn cold and blue. You have a fever along with a joint that is red, warm, and swollen. Summary Joint pain may be caused by many things. Your health care provider may recommend that you take pain medicine or wear a supportive device such as an elastic bandage, sling, or splint. If your joint pain  continues, you may need tests to diagnose the cause of your joint pain. Take over-the-counter and prescription medicines only as told by your health care provider. This information is not intended to replace advice given to you by your health care provider. Make sure you discuss any questions you have with your health care provider. Document Revised: 04/17/2019 Document Reviewed: 04/17/2019 Elsevier Patient Education  2023 [employer]  

## 2021-11-25 NOTE — Progress Notes (Signed)
Subjective:    Patient ID: Carrie Singh, female    DOB: 1955/01/29, 66 y.o.   MRN: [id number]  HPI  Patient presents to clinic today with complaint of right hip and left knee pain.  This started 1 month ago, after getting covid.  She describes the pain as sore and achy. She denies numbness or tingling but has noticed some swelling in her lower extremities. She denies recent fall. She has tried Oxycodone with some relief of symptoms. She has not seen orthopedics. Xray of the left knee from 02/2020 showed degenerative changes.  Review of Systems     Past Medical History:  Diagnosis Date   Breast cancer ([place]) 01/2021   left breast IDC   Breast cancer, right breast ([place]) 12/2007   a. s/p chemo, radiation, and lumpectomy with negative sentinel lymph node biopsy    Complication of anesthesia    woke up during colonoscopy   Depression    Diabetes mellitus without complication (HCC)    GERD (gastroesophageal reflux disease)    Hyperlipidemia    Hypertension    resistant, negative renal Dopplers and negative CT angiogram   Migraines    Obesity     Current Outpatient Medications  Medication Sig Dispense Refill   amLODipine (NORVASC) 10 MG tablet Take 1 tablet (10 mg total) by mouth daily. 90 tablet 1   Biotin w/ Vitamins C & E (HAIR SKIN & NAILS GUMMIES PO) Take by mouth.     calcium carbonate (OSCAL) 1500 (600 Ca) MG TABS tablet Take by mouth 2 (two) times daily with a meal.     carvedilol (COREG) 12.5 MG tablet Take 12.5 mg by mouth in the morning and at bedtime.     hydrALAZINE (APRESOLINE) 100 MG tablet TAKE 1 TABLET BY MOUTH 3 TIMES  DAILY 270 tablet 2   irbesartan (AVAPRO) 300 MG tablet TAKE 1 TABLET BY MOUTH DAILY 90 tablet 0   Lancets (ONETOUCH DELICA PLUS [id number]) MISC CHECK BLOOD SUGAR TWICE  DAILY 200 each 3   letrozole (FEMARA) 2.5 MG tablet Take 1 tablet (2.5 mg total) by mouth daily. 90 tablet 3   metFORMIN (GLUCOPHAGE) 1000 MG tablet Take 1,000 mg by mouth 2 (two)  times daily with a meal.     Multiple Vitamins-Minerals (ONE-A-DAY WOMENS VITACRAVES) CHEW Chew 1 tablet by mouth daily.      ONETOUCH ULTRA test strip USE TWICE DAILY AS DIRECTED 200 strip 2   pravastatin (PRAVACHOL) 40 MG tablet TAKE 1 TABLET BY MOUTH DAILY 90 tablet 1   repaglinide (PRANDIN) 0.5 MG tablet Take 0.5 mg by mouth 3 (three) times daily before meals.     No current facility-administered medications for this visit.    No Known Allergies  Family History  Problem Relation Age of Onset   Stroke Mother    Colon cancer Father 85   Heart disease Father    Asthma Father    Lung cancer Sister    Colon cancer Brother 75   Diabetes Brother    Breast cancer Niece        dx. <50   Breast cancer Niece        dx. <50   Breast cancer Niece        dx. >50   Esophageal cancer Neg Hx    Rectal cancer Neg Hx    Stomach cancer Neg Hx     Social History   Socioeconomic History   Marital status: Married    Spouse  name: Symphony Demuro   Number of children: 3   Years of education: 12+   Highest education level: Not on file  Occupational History   Occupation: Disabled  Tobacco Use   Smoking status: Never   Smokeless tobacco: Never  Vaping Use   Vaping Use: Never used  Substance and Sexual Activity   Alcohol use: No    Alcohol/week: 0.0 standard drinks of alcohol   Drug use: No   Sexual activity: Yes  Other Topics Concern   Not on file  Social History Narrative   Lives at home with husband.   Caffeine use: 1-2 drinks per month         Epworth Sleepiness Scale = 15 (as of 01/01/15)   Social Determinants of Health   Financial Resource Strain: Low Risk  (07/08/2021)   Overall Financial Resource Strain (CARDIA)    Difficulty of Paying Living Expenses: Not hard at all  Food Insecurity: No Food Insecurity (07/08/2021)   Hunger Vital Sign    Worried About Running Out of Food in the Last Year: Never true    Ran Out of Food in the Last Year: Never true  Transportation  Needs: No Transportation Needs (07/08/2021)   PRAPARE - Hydrologist (Medical): No    Lack of Transportation (Non-Medical): No  Physical Activity: Not on file  Stress: No Stress Concern Present (07/08/2021)   [place]    Feeling of Stress : Not at all  Social Connections: [place] (07/08/2021)   Social Connection and Isolation Panel [NHANES]    Frequency of Communication with Friends and Family: More than three times a week    Frequency of Social Gatherings with Friends and Family: Once a week    Attends Religious Services: More than 4 times per year    Active Member of [employer] or Organizations: Yes    Attends Music therapist: More than 4 times per year    Marital Status: Married  Human resources officer Violence: Not At Risk (07/08/2021)   Humiliation, Afraid, Rape, and Kick questionnaire    Fear of Current or Ex-Partner: No    Emotionally Abused: No    Physically Abused: No    Sexually Abused: No     Constitutional: Denies fever, malaise, fatigue, headache or abrupt weight changes.  Respiratory: Denies difficulty breathing, shortness of breath, cough or sputum production.   Cardiovascular: Pt reports swelling in ankles. Denies chest pain, chest tightness, palpitations or swelling in the hands.  Musculoskeletal: Patient reports right hip pain, left knee pain.  Denies decrease in range of motion, difficulty with gait, muscle pain or joint swelling.  Skin: Denies redness, rashes, lesions or ulcercations.  Neurological: Denies numbness, tingling, weakness or problems with balance and coordination.    No other specific complaints in a complete review of systems (except as listed in HPI above).  Objective:   Physical Exam BP 126/82 (BP Location: Left Wrist, Patient Position: Sitting, Cuff Size: Normal)   Pulse 89   Temp (!) 96.9 F (36.1 C) (Temporal)   Wt 213 lb (96.6 kg)    SpO2 99%   BMI 35.45 kg/m   Wt Readings from Last 3 Encounters:  10/12/21 211 lb (95.7 kg)  10/06/21 210 lb (95.3 kg)  07/06/21 206 lb (93.4 kg)    General: Appears her stated age, obese, in NAD. Cardiovascular: Normal rate and rhythm. S1,S2 noted.  No murmur, rubs or gallops noted.  Trace BLE edema. Pulmonary/Chest: Normal effort and positive vesicular breath sounds. No respiratory distress. No wheezes, rales or ronchi noted.  Musculoskeletal: Normal flexion, extension, abduction, abduction of the right hip.  Pain with internal rotation of the right hip.  Decreased external rotation of the right hip.  Pain with palpation of the right trochanter.  Normal flexion and extension of the left knee.  Pain with palpation along the medial joint line.  Trace peripatellar effusion noted.  Strength 5/5 BLE.  No difficulty with gait.  Neurological: Alert and oriented.   BMET    Component Value Date/Time   NA 138 10/13/2021 0013   NA 146 (H) 01/20/2015 1457   NA 143 10/12/2013 1510   K 3.6 10/13/2021 0013   K 3.9 10/12/2013 1510   CL 106 10/13/2021 0013   CL 109 (H) 10/12/2013 1510   CO2 25 10/13/2021 0013   CO2 29 10/12/2013 1510   GLUCOSE 91 10/13/2021 0013   GLUCOSE 92 10/12/2013 1510   BUN 14 10/13/2021 0013   BUN 17 01/20/2015 1457   BUN 16 10/12/2013 1510   CREATININE 1.06 (H) 10/13/2021 0013   CREATININE 1.12 (H) 06/17/2021 0928   CALCIUM 9.1 10/13/2021 0013   CALCIUM 9.4 10/12/2013 1510   GFRNONAA 57 (L) 10/13/2021 0013   GFRNONAA 56 (L) 10/12/2013 1510   GFRAA >60 01/24/2018 2159   GFRAA >60 10/12/2013 1510    Lipid Panel     Component Value Date/Time   CHOL 173 01/12/2021 1346   CHOL 179 10/17/2014 1527   TRIG 148 01/12/2021 1346   HDL 62 01/12/2021 1346   HDL 59 10/17/2014 1527   CHOLHDL 2.8 01/12/2021 1346   VLDL 15.2 01/09/2020 0957   LDLCALC 86 01/12/2021 1346    CBC    Component Value Date/Time   WBC 5.2 10/13/2021 0013   RBC 3.90 10/13/2021 0013   HGB  11.7 (L) 10/13/2021 0013   HGB 13.5 10/12/2013 1510   HGB 12.8 08/13/2009 0946   HCT 34.0 (L) 10/13/2021 0013   HCT 40.5 10/12/2013 1510   HCT 36.8 08/13/2009 0946   PLT 219 10/13/2021 0013   PLT 278 10/12/2013 1510   PLT 271 08/13/2009 0946   MCV 87.2 10/13/2021 0013   MCV 87 10/12/2013 1510   MCV 87.0 08/13/2009 0946   MCH 30.0 10/13/2021 0013   MCHC 34.4 10/13/2021 0013   RDW 12.9 10/13/2021 0013   RDW 13.6 10/12/2013 1510   RDW 13.8 08/13/2009 0946   LYMPHSABS 0.8 10/13/2021 0013   LYMPHSABS 1.5 08/13/2009 0946   MONOABS 0.7 10/13/2021 0013   MONOABS 0.2 08/13/2009 0946   EOSABS 0.0 10/13/2021 0013   EOSABS 0.1 08/13/2009 0946   BASOSABS 0.0 10/13/2021 0013   BASOSABS 0.0 08/13/2009 0946    Hgb A1C Lab Results  Component Value Date   HGBA1C 5.6 01/12/2021            Assessment & Plan:   Right Hip Pain:  X-ray right hip today Rx for Pred taper x9 days-monitor sugars  Left Knee Pain:  X-ray reviewed Rx for taper x9 days-monitor sugars Consider referral for orthopedics if symptoms persist or worsen  RTC in 1 month for your annual exam Webb Silversmith, NP

## 2021-11-30 ENCOUNTER — Other Ambulatory Visit: Payer: Self-pay | Admitting: Internal Medicine

## 2021-12-01 ENCOUNTER — Telehealth: Payer: Self-pay | Admitting: Hematology and Oncology

## 2021-12-01 NOTE — Telephone Encounter (Signed)
Rescheduled appointment per provider [facility]. Patient is aware of the changes made to her upcoming appointment. 

## 2021-12-01 NOTE — Telephone Encounter (Signed)
Unable to refill per protocol, Rx request is too soon. Last RF 08/26/21 for 90 and 1 RF. E-Prescribing Status: Receipt confirmed by pharmacy (08/26/2021  8:08 AM EDT). Will refuse.  Requested Prescriptions  Pending Prescriptions Disp Refills   pravastatin (PRAVACHOL) 40 MG tablet [Pharmacy Med Name: Pravastatin Sodium 40 MG Oral Tablet] 100 tablet 2    Sig: TAKE 1 TABLET BY MOUTH DAILY     Cardiovascular:  Antilipid - Statins Failed - 11/30/2021 10:30 PM      Failed - Lipid Panel in normal range within the last 12 months    Cholesterol, Total  Date Value Ref Range Status  10/17/2014 179 100 - 199 mg/dL Final   Cholesterol  Date Value Ref Range Status  01/12/2021 173 <200 mg/dL Final   LDL Cholesterol (Calc)  Date Value Ref Range Status  01/12/2021 86 mg/dL (calc) Final    Comment:    Reference range: <100 . Desirable range <100 mg/dL for primary prevention;   <70 mg/dL for patients with CHD or diabetic patients  with > or = 2 CHD risk factors. Marland Kitchen LDL-C is now calculated using the Martin-Hopkins  calculation, which is a validated novel method providing  better accuracy than the Friedewald equation in the  estimation of LDL-C.  Cresenciano Genre et al. Annamaria Helling. 1610;960(45): 2061-2068  (http://education.[employer].com/faq/FAQ164)    Direct LDL  Date Value Ref Range Status  09/05/2013 169.6 mg/dL Final    Comment:    Optimal:  <100 mg/dLNear or Above Optimal:  100-129 mg/dLBorderline High:  130-159 mg/dLHigh:  160-189 mg/dLVery High:  >190 mg/dL   HDL  Date Value Ref Range Status  01/12/2021 62 > OR = 50 mg/dL Final  10/17/2014 59 >39 mg/dL Final    Comment:    According to ATP-III Guidelines, HDL-C >59 mg/dL is considered a negative risk factor for CHD.    Triglycerides  Date Value Ref Range Status  01/12/2021 148 <150 mg/dL Final         Passed - Patient is not pregnant      Passed - Valid encounter within last 12 months    Recent Outpatient Visits           6  days ago Right hip pain   [facility] [place], [place] W, NP   4 months ago Type 2 diabetes mellitus with hyperglycemia, without long-term current use of insulin [facility])   [facility], NP   5 months ago Aortic atherosclerosis [facility])   [facility] [place], Coralie Keens, NP   5 months ago Primary hypertension   [place], Virl Diamond, RPH-CPP   6 months ago Primary hypertension   [place], Virl Diamond, RPH-CPP       Future Appointments             In 1 month Baity, Coralie Keens, NP [facility], [facility]

## 2021-12-03 ENCOUNTER — Other Ambulatory Visit: Payer: Self-pay | Admitting: Internal Medicine

## 2021-12-03 NOTE — Telephone Encounter (Signed)
Requested Prescriptions  Pending Prescriptions Disp Refills   irbesartan (AVAPRO) 300 MG tablet [Pharmacy Med Name: Irbesartan 300 MG Oral Tablet] 90 tablet 0    Sig: TAKE 1 TABLET BY MOUTH DAILY     Cardiovascular:  Angiotensin Receptor Blockers Failed - 12/03/2021 12:05 AM      Failed - Cr in normal range and within 180 days    Creat  Date Value Ref Range Status  06/17/2021 1.12 (H) 0.50 - 1.05 mg/dL Final   Creatinine, Ser  Date Value Ref Range Status  10/13/2021 1.06 (H) 0.44 - 1.00 mg/dL Final   Creatinine,U  Date Value Ref Range Status  04/03/2014 447.0 mg/dL Final   Creatinine, Urine  Date Value Ref Range Status  01/12/2021 423 (H) 20 - 275 mg/dL Final         Passed - K in normal range and within 180 days    Potassium  Date Value Ref Range Status  10/13/2021 3.6 3.5 - 5.1 mmol/L Final  10/12/2013 3.9 3.5 - 5.1 mmol/L Final         Passed - Patient is not pregnant      Passed - Last BP in normal range    BP Readings from Last 1 Encounters:  11/25/21 126/82         Passed - Valid encounter within last 6 months    Recent Outpatient Visits           1 week ago Right hip pain   [facility] [place], Coralie Keens, NP   5 months ago Type 2 diabetes mellitus with hyperglycemia, without long-term current use of insulin [facility])   [facility] Jearld Fenton, NP   5 months ago Aortic atherosclerosis [facility])   [facility], NP   5 months ago Primary hypertension   [place], Virl Diamond, RPH-CPP   6 months ago Primary hypertension   [place], RPH-CPP       Future Appointments             In 1 month Baity, Coralie Keens, NP [facility], [facility]

## 2021-12-21 DIAGNOSIS — N1831 Chronic kidney disease, stage 3a: Secondary | ICD-10-CM | POA: Diagnosis not present

## 2021-12-21 DIAGNOSIS — E1122 Type 2 diabetes mellitus with diabetic chronic kidney disease: Secondary | ICD-10-CM | POA: Diagnosis not present

## 2021-12-21 DIAGNOSIS — I10 Essential (primary) hypertension: Secondary | ICD-10-CM | POA: Diagnosis not present

## 2021-12-22 ENCOUNTER — Ambulatory Visit: Payer: Medicare Other | Admitting: Hematology and Oncology

## 2021-12-23 LAB — HM DIABETES EYE EXAM

## 2021-12-23 NOTE — Progress Notes (Signed)
Patient Care Team: Jearld Fenton, NP as PCP - General (Internal Medicine) Minna Merritts, MD as Consulting Physician (Cardiology) Nicholas Lose, MD as Consulting Physician (Hematology and Oncology) Delice Bison Charlestine Massed, NP as Nurse Practitioner (Hematology and Oncology) Erroll Luna, MD as Consulting Physician (General Surgery)  DIAGNOSIS:  Encounter Diagnosis  Name Primary?   Malignant neoplasm of left breast in female, estrogen receptor positive, unspecified site of breast ([place]) Yes    SUMMARY OF ONCOLOGIC HISTORY: Oncology History  History of breast cancer  12/18/2007 Initial Diagnosis   Right breast cancer treated with lumpectomy followed by radiation and 5 years of antiestrogen therapy with aromatase inhibitor   02/02/2016 Relapse/Recurrence   Right breast biopsy 6:00 position: IDC with DCIS grade 2, ER 100%, PR 90%, Ki-67 10%, HER-2 negative ratio 1.28, screening detected right breast lumps 0.5 cm and 0.3 cm, T1a N0 stage IA clinical stage   03/18/2016 Surgery   Right simple mastectomy: Grade 2 IDC with DCIS 1.5 cm, margins -0/3 lymph nodes, T1b N0 stage IA, ER 100%, PR 90%, Ki-67 10%, HER-2 negative ratio 1.28   03/18/2016 Oncotype testing   11/7%   04/2016 -  Anti-estrogen oral therapy   Letrozole 2.28m daily switch to exemestane 25 mg daily May 2018 switched to anastrozole 1 mg daily due to cost   03/10/2021 Surgery   Left Mastectomy : Grade 2 IDC 1.1 cm, 0/8 LN neg, ER 100%, PR: 0%, ki 67: 15%, HER 2 neg      04/2021 -  Anti-estrogen oral therapy   Letrozole daily    Genetic Testing   [facility] CancerNext-Expanded Panel was Negative. Report date is 08/09/2021.  The CancerNext-Expanded gene panel offered by [facility] includes sequencing, rearrangement, and RNA analysis for the following 77 genes: AIP, ALK, APC, ATM, AXIN2, BAP1, BARD1, BLM, BMPR1A, BRCA1, BRCA2, BRIP1, CDC73, CDH1, CDK4, CDKN1B, CDKN2A, CHEK2, CTNNA1, DICER1, FANCC, FH, FLCN, GALNT12,  KIF1B, LZTR1, MAX, MEN1, MET, MLH1, MSH2, MSH3, MSH6, MUTYH, NBN, NF1, NF2, NTHL1, PALB2, PHOX2B, PMS2, POT1, PRKAR1A, PTCH1, PTEN, RAD51C, RAD51D, RB1, RECQL, RET, SDHA, SDHAF2, SDHB, SDHC, SDHD, SMAD4, SMARCA4, SMARCB1, SMARCE1, STK11, SUFU, TMEM127, TP53, TSC1, TSC2, VHL and XRCC2 (sequencing and deletion/duplication); EGFR, EGLN1, HOXB13, KIT, MITF, PDGFRA, POLD1, and POLE (sequencing only); EPCAM and GREM1 (deletion/duplication only).    Malignant ne[place] of left breast in [place], estrogen receptor positive ([place]  02/18/2021 Initial Diagnosis   Malignant ne[place] of left breast in female, estrogen receptor positive ([place]   03/10/2021 Cancer Staging   Staging form: Breast, AJCC 8th Edition - Pathologic stage from 03/10/2021: Stage IA (pT1c, pN0, cM0, G2, ER+, PR-, HER2-) - Signed by CGardenia Phlegm NP on 06/21/2021 Stage prefix: Initial diagnosis Histologic grading system: 3 grade system    Genetic Testing   [facility] CancerNext-Expanded Panel was Negative. Report date is 08/09/2021.  The CancerNext-Expanded gene panel offered by [facility] includes sequencing, rearrangement, and RNA analysis for the following 77 genes: AIP, ALK, APC, ATM, AXIN2, BAP1, BARD1, BLM, BMPR1A, BRCA1, BRCA2, BRIP1, CDC73, CDH1, CDK4, CDKN1B, CDKN2A, CHEK2, CTNNA1, DICER1, FANCC, FH, FLCN, GALNT12, KIF1B, LZTR1, MAX, MEN1, MET, MLH1, MSH2, MSH3, MSH6, MUTYH, NBN, NF1, NF2, NTHL1, PALB2, PHOX2B, PMS2, POT1, PRKAR1A, PTCH1, PTEN, RAD51C, RAD51D, RB1, RECQL, RET, SDHA, SDHAF2, SDHB, SDHC, SDHD, SMAD4, SMARCA4, SMARCB1, SMARCE1, STK11, SUFU, TMEM127, TP53, TSC1, TSC2, VHL and XRCC2 (sequencing and deletion/duplication); EGFR, EGLN1, HOXB13, KIT, MITF, PDGFRA, POLD1, and POLE (sequencing only); EPCAM and GREM1 (deletion/duplication only).  CHIEF COMPLIANT: Follow-up of right breast cancer status postmastectomy   INTERVAL HISTORY: Carrie Singh is a  66 y.o. with above-mentioned history of right breast  cancer treated with mastectomy followed by antiestrogen therapy with letrozole. She presents to the clinic today for follow-up. She states that she has been doing good. She has tolerated the letrozole extremely well. She denies any hot flashes. She says she has been having problems walking due to arthritis. She denies any pain or discomfort in breast She says the only concern she has that the breast itch all the time.   ALLERGIES:  has No Known Allergies.  MEDICATIONS:  Current Outpatient Medications  Medication Sig Dispense Refill   amLODipine (NORVASC) 10 MG tablet TAKE 1 TABLET(10 MG) BY MOUTH DAILY 90 tablet 0   Biotin w/ Vitamins C & E (HAIR SKIN & NAILS GUMMIES PO) Take by mouth.     calcium carbonate (OSCAL) 1500 (600 Ca) MG TABS tablet Take by mouth 2 (two) times daily with a meal.     carvedilol (COREG) 12.5 MG tablet Take 12.5 mg by mouth in the morning and at bedtime.     hydrALAZINE (APRESOLINE) 100 MG tablet TAKE 1 TABLET BY MOUTH 3 TIMES  DAILY 270 tablet 2   irbesartan (AVAPRO) 300 MG tablet TAKE 1 TABLET BY MOUTH DAILY 90 tablet 0   Lancets (ONETOUCH DELICA PLUS [id number]) MISC CHECK BLOOD SUGAR TWICE  DAILY 200 each 3   letrozole (FEMARA) 2.5 MG tablet Take 1 tablet (2.5 mg total) by mouth daily. 90 tablet 3   metFORMIN (GLUCOPHAGE) 1000 MG tablet Take 1,000 mg by mouth 2 (two) times daily with a meal.     Multiple Vitamins-Minerals (ONE-A-DAY WOMENS VITACRAVES) CHEW Chew 1 tablet by mouth daily.      ONETOUCH ULTRA test strip USE TWICE DAILY AS DIRECTED 200 strip 2   pravastatin (PRAVACHOL) 40 MG tablet TAKE 1 TABLET BY MOUTH DAILY 90 tablet 1   predniSONE (DELTASONE) 10 MG tablet Take 3 tabs on days 1-3, 2 tabs on days 4-6, 1 tab on days 7-9 18 tablet 0   repaglinide (PRANDIN) 0.5 MG tablet Take 0.5 mg by mouth 3 (three) times daily before meals.     No current facility-administered medications for this visit.    PHYSICAL EXAMINATION: ECOG PERFORMANCE STATUS: 1 -  Symptomatic but completely ambulatory  Vitals:   12/27/21 1054  BP: (!) 143/77  Pulse: 81  Resp: 18  Temp: 97.7 F (36.5 C)  SpO2: 99%   Filed Weights   12/27/21 1054  Weight: 211 lb 9.6 oz (96 kg)    BREAST: Bilateral chest mastectomy scars without any palpable lumps or nodules (exam performed in the presence of a chaperone)  LABORATORY DATA:  I have reviewed the data as listed    Latest Ref Rng & Units 10/13/2021   12:13 AM 10/06/2021   10:29 AM 06/17/2021    9:28 AM  CMP  Glucose 70 - 99 mg/dL 91  114  171   BUN 8 - 23 mg/dL _0 Creatinine 0.44 - 1.00 mg/dL 1.06  1.07  1.12   Sodium 135 - 145 mmol/L 138  139  142   Potassium 3.5 - 5.1 mmol/L 3.6  3.6  4.2   Chloride 98 - 111 mmol/L 106  104  105   CO2 22 - 32 mmol/L _1 Calcium 8.9 - 10.3 mg/dL 9.1  9.7  9.8   Total Protein 6.5 - 8.1 g/dL 6.9     Total Bilirubin 0.3 - 1.2 mg/dL 1.1     Alkaline Phos 38 - 126 U/L 48     AST 15 - 41 U/L 18     ALT 0 - 44 U/L 15       Lab Results  Component Value Date   WBC 5.2 10/13/2021   HGB 11.7 (L) 10/13/2021   HCT 34.0 (L) 10/13/2021   MCV 87.2 10/13/2021   PLT 219 10/13/2021   NEUTROABS 3.7 10/13/2021    ASSESSMENT & PLAN:  Malignant neoplasm of left breast in female, estrogen receptor positive ([place]) Right simple mastectomy: Grade 2 IDC with DCIS 1.5 cm, margins -0/3 lymph nodes, T1b N0 stage IA, ER 100%, PR 90%, Ki-67 10%, HER-2 negative ratio 1.28 Oncotype DX score 11:7% risk of recurrence (2009 right breast cancer treated with lumpectomy radiation and 5 years of antiestrogen therapy)   Current treatment: Letrozole 2.5 mg daily started 04/2016 switched to exemestane 05/26/2016 switched to anastrozole, switched to letrozole 03/18/2021   Breast cancer recurrence 1. Mammogram left breast: With ultrasound 02/02/2021: 0.6 cm irregular hypoechoic mass: Biopsy: Grade 2 invasive ductal carcinoma ER 100%, PR 0%, HER2 equivocal, FISH negative    Recommendation: Left Mastectomy : Grade 2 IDC 1.1 cm, 0/8 LN neg, ER 100%, PR: 0%, ki 67: 15%, HER 2 neg Followed by adjuvant antiestrogen therapy.   Letrozole.  Letrozole toxicities: Tolerating extremely well without any problems or concerns.  Itching of the left breast: Encouraged her to apply moisturizing lotions and use cortisone as needed.  Return to clinic in 1 year for follow-up    No orders of the defined types were placed in this encounter.  The patient has a good understanding of the overall plan. she agrees with it. she will call with any problems that may develop before the next visit here. Total time spent: 30 mins including face to face time and time spent for planning, charting and co-ordination of care   Harriette Ohara, MD 12/27/21    I Gardiner Coins am scribing for Dr. Lindi Adie  I have reviewed the above documentation for accuracy and completeness, and I agree with the above.

## 2021-12-24 ENCOUNTER — Other Ambulatory Visit: Payer: Self-pay | Admitting: Internal Medicine

## 2021-12-24 DIAGNOSIS — I10 Essential (primary) hypertension: Secondary | ICD-10-CM

## 2021-12-27 ENCOUNTER — Inpatient Hospital Stay: Payer: Medicare Other | Attending: Hematology and Oncology | Admitting: Hematology and Oncology

## 2021-12-27 VITALS — BP 143/77 | HR 81 | Temp 97.7°F | Resp 18 | Ht 65.0 in | Wt 211.6 lb

## 2021-12-27 DIAGNOSIS — Z79811 Long term (current) use of aromatase inhibitors: Secondary | ICD-10-CM | POA: Insufficient documentation

## 2021-12-27 DIAGNOSIS — Z853 Personal history of malignant neoplasm of breast: Secondary | ICD-10-CM | POA: Insufficient documentation

## 2021-12-27 DIAGNOSIS — Z79899 Other long term (current) drug therapy: Secondary | ICD-10-CM | POA: Insufficient documentation

## 2021-12-27 DIAGNOSIS — Z170 Estrogen receptor positive status [ER+]: Secondary | ICD-10-CM | POA: Diagnosis not present

## 2021-12-27 DIAGNOSIS — Z9013 Acquired absence of bilateral breasts and nipples: Secondary | ICD-10-CM | POA: Insufficient documentation

## 2021-12-27 DIAGNOSIS — C50412 Malignant neoplasm of upper-outer quadrant of left female breast: Secondary | ICD-10-CM | POA: Diagnosis not present

## 2021-12-27 DIAGNOSIS — R262 Difficulty in walking, not elsewhere classified: Secondary | ICD-10-CM | POA: Insufficient documentation

## 2021-12-27 DIAGNOSIS — C50912 Malignant neoplasm of unspecified site of left female breast: Secondary | ICD-10-CM

## 2021-12-27 NOTE — Assessment & Plan Note (Addendum)
Right simple mastectomy: Grade 2 IDC with DCIS 1.5 cm, margins -0/3 lymph nodes, T1b N0 stage IA, ER 100%, PR 90%, Ki-67 10%, HER-2 negative ratio 1.28 Oncotype DX score 11:7% risk of recurrence (2009 right breast cancer treated with lumpectomy radiation and 5 years of antiestrogen therapy)   Current treatment: Letrozole 2.5 mg daily started 04/2016 switched to exemestane 05/26/2016 switched to anastrozole, switched to letrozole 03/18/2021   Breast cancer recurrence 1. Mammogram left breast: With ultrasound 02/02/2021: 0.6 cm irregular hypoechoic mass: Biopsy: Grade 2 invasive ductal carcinoma ER 100%, PR 0%, HER2 equivocal, FISH negative   Recommendation: Left Mastectomy : Grade 2 IDC 1.1 cm, 0/8 LN neg, ER 100%, PR: 0%, ki 67: 15%, HER 2 neg Followed by adjuvant antiestrogen therapy.   Letrozole.  Letrozole toxicities: Tolerating extremely well without any problems or concerns.  Itching of the left breast: Encouraged her to apply moisturizing lotions and use cortisone as needed.  Return to clinic in 1 year for follow-up

## 2021-12-27 NOTE — Telephone Encounter (Signed)
Requested Prescriptions  Pending Prescriptions Disp Refills   amLODipine (NORVASC) 10 MG tablet [Pharmacy Med Name: AMLODIPINE BESYLATE '10MG'$  TABLETS] 90 tablet 0    Sig: TAKE 1 TABLET(10 MG) BY MOUTH DAILY     Cardiovascular: Calcium Channel Blockers 2 Passed - 12/24/2021  5:39 PM      Passed - Last BP in normal range    BP Readings from Last 1 Encounters:  11/25/21 126/82         Passed - Last Heart Rate in normal range    Pulse Readings from Last 1 Encounters:  11/25/21 89         Passed - Valid encounter within last 6 months    Recent Outpatient Visits           1 month ago Right hip pain   [facility] [place], [place] W, NP   5 months ago Type 2 diabetes mellitus with hyperglycemia, without long-term current use of insulin [facility])   [facility] [place], Coralie Keens, NP   6 months ago Aortic atherosclerosis [facility])   [facility], Coralie Keens, NP   6 months ago Primary hypertension   [place], Virl Diamond, RPH-CPP   7 months ago Primary hypertension   [place], RPH-CPP       Future Appointments             In 3 weeks Baity, Coralie Keens, NP [facility], [facility]

## 2022-01-12 ENCOUNTER — Telehealth: Payer: Self-pay

## 2022-01-12 DIAGNOSIS — G8929 Other chronic pain: Secondary | ICD-10-CM

## 2022-01-12 NOTE — Telephone Encounter (Signed)
Copied from [place] [phone]. Topic: Referral - Request for Referral >> Jan 12, 2022  9:37 AM Cyndi Bender wrote: Has patient seen PCP for this complaint? Yes.   *If NO, is insurance requiring patient see PCP for this issue before PCP can refer them? Referral for which specialty: Ortho Preferred provider/office: no specific location Reason for referral: Pt requests referral to ortho for problems with knee

## 2022-01-12 NOTE — Addendum Note (Signed)
Addended by: Jearld Fenton on: 01/12/2022 09:58 AM   Modules accepted: Orders

## 2022-01-12 NOTE — Telephone Encounter (Signed)
Referral placed.

## 2022-01-18 ENCOUNTER — Ambulatory Visit (INDEPENDENT_AMBULATORY_CARE_PROVIDER_SITE_OTHER): Payer: Medicare Other | Admitting: Internal Medicine

## 2022-01-18 ENCOUNTER — Encounter: Payer: Self-pay | Admitting: Internal Medicine

## 2022-01-18 VITALS — BP 118/82 | HR 80 | Temp 96.9°F | Ht 65.0 in | Wt 210.0 lb

## 2022-01-18 DIAGNOSIS — Z0001 Encounter for general adult medical examination with abnormal findings: Secondary | ICD-10-CM | POA: Diagnosis not present

## 2022-01-18 DIAGNOSIS — Z1231 Encounter for screening mammogram for malignant neoplasm of breast: Secondary | ICD-10-CM | POA: Diagnosis not present

## 2022-01-18 DIAGNOSIS — E1165 Type 2 diabetes mellitus with hyperglycemia: Secondary | ICD-10-CM | POA: Diagnosis not present

## 2022-01-18 DIAGNOSIS — Z6834 Body mass index (BMI) 34.0-34.9, adult: Secondary | ICD-10-CM

## 2022-01-18 DIAGNOSIS — E6609 Other obesity due to excess calories: Secondary | ICD-10-CM | POA: Diagnosis not present

## 2022-01-18 MED ORDER — ASPIRIN 81 MG PO TBEC: 81.0000 mg | DELAYED_RELEASE_TABLET | Freq: Every day | ORAL | 12 refills | Status: AC

## 2022-01-18 NOTE — Patient Instructions (Signed)
Kegel Exercises  Kegel exercises can help strengthen your pelvic floor muscles. The pelvic floor is a group of muscles that support your rectum, small intestine, and bladder. In females, pelvic floor muscles also help support the uterus. These muscles help you control the flow of urine and stool (feces). Kegel exercises are painless and simple. They do not require any equipment. Your provider may suggest Kegel exercises to: Improve bladder and bowel control. Improve sexual response. Improve weak pelvic floor muscles after surgery to remove the uterus (hysterectomy) or after pregnancy, in females. Improve weak pelvic floor muscles after prostate gland removal or surgery, in males. Kegel exercises involve squeezing your pelvic floor muscles. These are the same muscles you squeeze when you try to stop the flow of urine or keep from passing gas. The exercises can be done while sitting, standing, or lying down, but it is best to vary your position. Ask your health care provider which exercises are safe for you. Do exercises exactly as told by your health care provider and adjust them as directed. Do not begin these exercises until told by your health care provider. Exercises How to do Kegel exercises: Squeeze your pelvic floor muscles tight. You should feel a tight lift in your rectal area. If you are a female, you should also feel a tightness in your vaginal area. Keep your stomach, buttocks, and legs relaxed. Hold the muscles tight for up to 10 seconds. Breathe normally. Relax your muscles for up to 10 seconds. Repeat as told by your health care provider. Repeat this exercise daily as told by your health care provider. Continue to do this exercise for at least 4-6 weeks, or for as long as told by your health care provider. You may be referred to a physical therapist who can help you learn more about how to do Kegel exercises. Depending on your condition, your health care provider may  recommend: Varying how long you squeeze your muscles. Doing several sets of exercises every day. Doing exercises for several weeks. Making Kegel exercises a part of your regular exercise routine. This information is not intended to replace advice given to you by your health care provider. Make sure you discuss any questions you have with your health care provider. Document Revised: 05/14/2020 Document Reviewed: 05/14/2020 Elsevier Patient Education  2023 Elsevier Inc.  

## 2022-01-18 NOTE — Assessment & Plan Note (Signed)
Encourage diet and exercise for weight loss 

## 2022-01-18 NOTE — Progress Notes (Signed)
Subjective:    Patient ID: Carrie Singh, female    DOB: 01-28-1955, 67 y.o.   MRN: [id number]  HPI  Patient presents to clinic today for her annual exam.  Flu: 10/2021 Tetanus: 12/2008 COVID: Pfizer x 4 Pneumovax: 12/2019 Prevnar: 11/2018 Shingrix: x 2 Pap smear: Hysterectomy Mammogram: 01/2021 Bone density: 12/2020 Colon screening: 03/2021 Vision screening: annually Dentist: biannually  Diet: She does eat meat. She consumes fruits and veggies. She does eat fried foods. She drinks mostly water. Exercise: None  Review of Systems     Past Medical History:  Diagnosis Date   Breast cancer ([place]) 01/2021   left breast IDC   Breast cancer, right breast ([place]) 12/2007   a. s/p chemo, radiation, and lumpectomy with negative sentinel lymph node biopsy    Complication of anesthesia    woke up during colonoscopy   Depression    Diabetes mellitus without complication (HCC)    GERD (gastroesophageal reflux disease)    Hyperlipidemia    Hypertension    resistant, negative renal Dopplers and negative CT angiogram   Migraines    Obesity     Current Outpatient Medications  Medication Sig Dispense Refill   amLODipine (NORVASC) 10 MG tablet TAKE 1 TABLET(10 MG) BY MOUTH DAILY 90 tablet 0   Biotin w/ Vitamins C & E (HAIR SKIN & NAILS GUMMIES PO) Take by mouth.     calcium carbonate (OSCAL) 1500 (600 Ca) MG TABS tablet Take by mouth 2 (two) times daily with a meal.     carvedilol (COREG) 12.5 MG tablet Take 12.5 mg by mouth in the morning and at bedtime.     hydrALAZINE (APRESOLINE) 100 MG tablet TAKE 1 TABLET BY MOUTH 3 TIMES  DAILY 270 tablet 2   irbesartan (AVAPRO) 300 MG tablet TAKE 1 TABLET BY MOUTH DAILY 90 tablet 0   Lancets (ONETOUCH DELICA PLUS [id number]) MISC CHECK BLOOD SUGAR TWICE  DAILY 200 each 3   letrozole (FEMARA) 2.5 MG tablet Take 1 tablet (2.5 mg total) by mouth daily. 90 tablet 3   metFORMIN (GLUCOPHAGE) 1000 MG tablet Take 1,000 mg by mouth 2 (two) times daily  with a meal.     Multiple Vitamins-Minerals (ONE-A-DAY WOMENS VITACRAVES) CHEW Chew 1 tablet by mouth daily.      ONETOUCH ULTRA test strip USE TWICE DAILY AS DIRECTED 200 strip 2   pravastatin (PRAVACHOL) 40 MG tablet TAKE 1 TABLET BY MOUTH DAILY 90 tablet 1   predniSONE (DELTASONE) 10 MG tablet Take 3 tabs on days 1-3, 2 tabs on days 4-6, 1 tab on days 7-9 18 tablet 0   repaglinide (PRANDIN) 0.5 MG tablet Take 0.5 mg by mouth 3 (three) times daily before meals.     No current facility-administered medications for this visit.    No Known Allergies  Family History  Problem Relation Age of Onset   Stroke Mother    Colon cancer Father 31   Heart disease Father    Asthma Father    Lung cancer Sister    Colon cancer Brother 21   Diabetes Brother    Breast cancer Niece        dx. <50   Breast cancer Niece        dx. <50   Breast cancer Niece        dx. >50   Esophageal cancer Neg Hx    Rectal cancer Neg Hx    Stomach cancer Neg Hx     Social History  Socioeconomic History   Marital status: Married    Spouse name: Borghild Thaker   Number of children: 3   Years of education: 12+   Highest education level: Not on file  Occupational History   Occupation: Disabled  Tobacco Use   Smoking status: Never   Smokeless tobacco: Never  Vaping Use   Vaping Use: Never used  Substance and Sexual Activity   Alcohol use: No    Alcohol/week: 0.0 standard drinks of alcohol   Drug use: No   Sexual activity: Yes  Other Topics Concern   Not on file  Social History Narrative   Lives at home with husband.   Caffeine use: 1-2 drinks per month         Epworth Sleepiness Scale = 15 (as of 01/01/15)   Social Determinants of Health   Financial Resource Strain: Low Risk  (07/08/2021)   Overall Financial Resource Strain (CARDIA)    Difficulty of Paying Living Expenses: Not hard at all  Food Insecurity: No Food Insecurity (07/08/2021)   Hunger Vital Sign    Worried About Running Out of  Food in the Last Year: Never true    Ran Out of Food in the Last Year: Never true  Transportation Needs: No Transportation Needs (07/08/2021)   PRAPARE - Hydrologist (Medical): No    Lack of Transportation (Non-Medical): No  Physical Activity: Not on file  Stress: No Stress Concern Present (07/08/2021)   [place]    Feeling of Stress : Not at all  Social Connections: [place] (07/08/2021)   Social Connection and Isolation Panel [NHANES]    Frequency of Communication with Friends and Family: More than three times a week    Frequency of Social Gatherings with Friends and Family: Once a week    Attends Religious Services: More than 4 times per year    Active Member of [employer] or Organizations: Yes    Attends Music therapist: More than 4 times per year    Marital Status: Married  Human resources officer Violence: Not At Risk (07/08/2021)   Humiliation, Afraid, Rape, and Kick questionnaire    Fear of Current or Ex-Partner: No    Emotionally Abused: No    Physically Abused: No    Sexually Abused: No     Constitutional: Patient reports intermittent headaches.  Denies fever, malaise, fatigue, or abrupt weight changes.  HEENT: Denies eye pain, eye redness, ear pain, ringing in the ears, wax buildup, runny nose, nasal congestion, bloody nose, or sore throat. Respiratory: Denies difficulty breathing, shortness of breath, cough or sputum production.   Cardiovascular: Denies chest pain, chest tightness, palpitations or swelling in the hands or feet.  Gastrointestinal: Denies abdominal pain, bloating, constipation, diarrhea or blood in the stool.  GU: Patient reports urge incontinence.  Denies urgency, frequency, pain with urination, burning sensation, blood in urine, odor or discharge. Musculoskeletal: Patient reports joint pain, difficulty with gait.  Denies decrease in range of motion,  muscle pain or joint swelling.  Skin: Denies redness, rashes, lesions or ulcercations.  Neurological: Denies dizziness, difficulty with memory, difficulty with speech or problems with balance and coordination.  Psych: Patient has a history of depression.  Denies anxiety, SI/HI.  No other specific complaints in a complete review of systems (except as listed in HPI above).  Objective:   Physical Exam BP 118/82 (BP Location: Right Wrist, Patient Position: Sitting, Cuff Size: Normal)   Pulse  80   Temp (!) 96.9 F (36.1 C) (Temporal)   Ht _0  (1.651 m)   Wt 210 lb (95.3 kg)   SpO2 100%   BMI 34.95 kg/m   Wt Readings from Last 3 Encounters:  12/27/21 211 lb 9.6 oz (96 kg)  11/25/21 213 lb (96.6 kg)  10/12/21 211 lb (95.7 kg)    General: Appears her stated age, obese, in NAD. Skin: Warm, dry and intact. No  ulcerations noted. HEENT: Head: normal shape and size; Eyes: sclera white, no icterus, conjunctiva pink, PERRLA and EOMs intact;  Neck:  Neck supple, trachea midline. No masses, lumps or thyromegaly present.  Cardiovascular: Normal rate and rhythm. S1,S2 noted.  No murmur, rubs or gallops noted. No JVD or BLE edema. No carotid bruits noted. Pulmonary/Chest: Normal effort and positive vesicular breath sounds. No respiratory distress. No wheezes, rales or ronchi noted.  Abdomen: Normal bowel sounds.  Musculoskeletal: Strength 5/5 BUE/BLE.  Limping gait. Neurological: Alert and oriented. Cranial nerves II-XII grossly intact. Coordination normal.  Psychiatric: Mood and affect normal. Behavior is normal. Judgment and thought content normal.    BMET    Component Value Date/Time   NA 138 10/13/2021 0013   NA 146 (H) 01/20/2015 1457   NA 143 10/12/2013 1510   K 3.6 10/13/2021 0013   K 3.9 10/12/2013 1510   CL 106 10/13/2021 0013   CL 109 (H) 10/12/2013 1510   CO2 25 10/13/2021 0013   CO2 29 10/12/2013 1510   GLUCOSE 91 10/13/2021 0013   GLUCOSE 92 10/12/2013 1510   BUN 14  10/13/2021 0013   BUN 17 01/20/2015 1457   BUN 16 10/12/2013 1510   CREATININE 1.06 (H) 10/13/2021 0013   CREATININE 1.12 (H) 06/17/2021 0928   CALCIUM 9.1 10/13/2021 0013   CALCIUM 9.4 10/12/2013 1510   GFRNONAA 57 (L) 10/13/2021 0013   GFRNONAA 56 (L) 10/12/2013 1510   GFRAA >60 01/24/2018 2159   GFRAA >60 10/12/2013 1510    Lipid Panel     Component Value Date/Time   CHOL 173 01/12/2021 1346   CHOL 179 10/17/2014 1527   TRIG 148 01/12/2021 1346   HDL 62 01/12/2021 1346   HDL 59 10/17/2014 1527   CHOLHDL 2.8 01/12/2021 1346   VLDL 15.2 01/09/2020 0957   LDLCALC 86 01/12/2021 1346    CBC    Component Value Date/Time   WBC 5.2 10/13/2021 0013   RBC 3.90 10/13/2021 0013   HGB 11.7 (L) 10/13/2021 0013   HGB 13.5 10/12/2013 1510   HGB 12.8 08/13/2009 0946   HCT 34.0 (L) 10/13/2021 0013   HCT 40.5 10/12/2013 1510   HCT 36.8 08/13/2009 0946   PLT 219 10/13/2021 0013   PLT 278 10/12/2013 1510   PLT 271 08/13/2009 0946   MCV 87.2 10/13/2021 0013   MCV 87 10/12/2013 1510   MCV 87.0 08/13/2009 0946   MCH 30.0 10/13/2021 0013   MCHC 34.4 10/13/2021 0013   RDW 12.9 10/13/2021 0013   RDW 13.6 10/12/2013 1510   RDW 13.8 08/13/2009 0946   LYMPHSABS 0.8 10/13/2021 0013   LYMPHSABS 1.5 08/13/2009 0946   MONOABS 0.7 10/13/2021 0013   MONOABS 0.2 08/13/2009 0946   EOSABS 0.0 10/13/2021 0013   EOSABS 0.1 08/13/2009 0946   BASOSABS 0.0 10/13/2021 0013   BASOSABS 0.0 08/13/2009 0946    Hgb A1C Lab Results  Component Value Date   HGBA1C 5.6 01/12/2021  Assessment & Plan:   Preventative Health Maintenance:  Flu shot UTD She declines tetanus for financial reasons, advised if she gets bit or cut to go get this done at the pharmacy Encouraged her to get her COVID booster Pneumovax and Prevnar UTD Shingrix vaccine UTD She no longer needs Pap smears Mammogram ordered-she will call to schedule  Bone density UTD Colon screening UTD Encouraged her to  consume a balanced diet and exercise regimen Advised her to see an eye doctor and dentist annually we will check CBC, c-Met, lipid, A1c, urine microalbumin today  RTC in 6 months, follow-up chronic conditions Webb Sil[place], NP

## 2022-01-19 LAB — CBC
HCT: 36.5 % (ref 35.0–45.0)
Hemoglobin: 12.3 g/dL (ref 11.7–15.5)
MCH: 29.9 pg (ref 27.0–33.0)
MCHC: 33.7 g/dL (ref 32.0–36.0)
MCV: 88.8 fL (ref 80.0–100.0)
MPV: 9.7 fL (ref 7.5–12.5)
Platelets: 266 10*3/uL (ref 140–400)
RBC: 4.11 10*6/uL (ref 3.80–5.10)
RDW: 12.8 % (ref 11.0–15.0)
WBC: 4.1 10*3/uL (ref 3.8–10.8)

## 2022-01-19 LAB — COMPLETE METABOLIC PANEL WITH GFR
AG Ratio: 1.8 (calc) (ref 1.0–2.5)
ALT: 11 U/L (ref 6–29)
AST: 11 U/L (ref 10–35)
Albumin: 4.4 g/dL (ref 3.6–5.1)
Alkaline phosphatase (APISO): 50 U/L (ref 37–153)
BUN/Creatinine Ratio: 17 (calc) (ref 6–22)
BUN: 18 mg/dL (ref 7–25)
CO2: 29 mmol/L (ref 20–32)
Calcium: 10.1 mg/dL (ref 8.6–10.4)
Chloride: 104 mmol/L (ref 98–110)
Creat: 1.08 mg/dL — ABNORMAL HIGH (ref 0.50–1.05)
Globulin: 2.5 g/dL (calc) (ref 1.9–3.7)
Glucose, Bld: 92 mg/dL (ref 65–99)
Potassium: 4.2 mmol/L (ref 3.5–5.3)
Sodium: 140 mmol/L (ref 135–146)
Total Bilirubin: 0.7 mg/dL (ref 0.2–1.2)
Total Protein: 6.9 g/dL (ref 6.1–8.1)
eGFR: 56 mL/min/{1.73_m2} — ABNORMAL LOW (ref >=60)

## 2022-01-19 LAB — LIPID PANEL
Cholesterol: 170 mg/dL (ref <200)
HDL: 75 mg/dL (ref >=50)
LDL Cholesterol (Calc): 79 mg/dL (calc)
Non-HDL Cholesterol (Calc): 95 mg/dL (calc) (ref <130)
Total CHOL/HDL Ratio: 2.3 (calc) (ref <5.0)
Triglycerides: 79 mg/dL (ref <150)

## 2022-01-19 LAB — HEMOGLOBIN A1C
Hgb A1c MFr Bld: 5.9 % of total Hgb — ABNORMAL HIGH (ref <5.7)
Mean Plasma Glucose: 123 mg/dL
eAG (mmol/L): 6.8 mmol/L

## 2022-01-19 LAB — MICROALBUMIN / CREATININE URINE RATIO
Creatinine, Urine: 172 mg/dL (ref 20–275)
Microalb Creat Ratio: 6 mcg/mg creat (ref <30)
Microalb, Ur: 1 mg/dL

## 2022-01-20 DIAGNOSIS — M1712 Unilateral primary osteoarthritis, left knee: Secondary | ICD-10-CM | POA: Diagnosis not present

## 2022-01-20 DIAGNOSIS — N182 Chronic kidney disease, stage 2 (mild): Secondary | ICD-10-CM | POA: Diagnosis not present

## 2022-01-20 DIAGNOSIS — E1122 Type 2 diabetes mellitus with diabetic chronic kidney disease: Secondary | ICD-10-CM | POA: Diagnosis not present

## 2022-01-20 DIAGNOSIS — I700 Atherosclerosis of aorta: Secondary | ICD-10-CM | POA: Diagnosis not present

## 2022-01-23 ENCOUNTER — Other Ambulatory Visit: Payer: Self-pay | Admitting: Hematology and Oncology

## 2022-02-03 ENCOUNTER — Other Ambulatory Visit: Payer: Self-pay | Admitting: Internal Medicine

## 2022-02-04 NOTE — Telephone Encounter (Signed)
Requested Prescriptions  Pending Prescriptions Disp Refills   irbesartan (AVAPRO) 300 MG tablet [Pharmacy Med Name: Irbesartan 300 MG Oral Tablet] 90 tablet 1    Sig: TAKE 1 TABLET BY MOUTH DAILY     Cardiovascular:  Angiotensin Receptor Blockers Failed - 02/03/2022 10:23 PM      Failed - Cr in normal range and within 180 days    Creat  Date Value Ref Range Status  01/18/2022 1.08 (H) 0.50 - 1.05 mg/dL Final   Creatinine,U  Date Value Ref Range Status  04/03/2014 447.0 mg/dL Final   Creatinine, Urine  Date Value Ref Range Status  01/18/2022 172 20 - 275 mg/dL Final         Passed - K in normal range and within 180 days    Potassium  Date Value Ref Range Status  01/18/2022 4.2 3.5 - 5.3 mmol/L Final  10/12/2013 3.9 3.5 - 5.1 mmol/L Final         Passed - Patient is not pregnant      Passed - Last BP in normal range    BP Readings from Last 1 Encounters:  01/18/22 118/82         Passed - Valid encounter within last 6 months    Recent Outpatient Visits           2 weeks ago Encounter for general adult medical examination with abnormal findings   [facility] [place], Coralie Keens, NP   2 months ago Right hip pain   [facility] [place], Coralie Keens, NP   7 months ago Type 2 diabetes mellitus with hyperglycemia, without long-term current use of insulin [facility])   [facility] [place], Coralie Keens, NP   7 months ago Aortic atherosclerosis [facility])   [facility] [place], Coralie Keens, NP   8 months ago Primary hypertension   [place], RPH-CPP       Future Appointments             In 4 months Baity, Coralie Keens, NP [facility], [facility]

## 2022-02-10 DIAGNOSIS — M1710 Unilateral primary osteoarthritis, unspecified knee: Secondary | ICD-10-CM | POA: Diagnosis not present

## 2022-02-10 DIAGNOSIS — M25462 Effusion, left knee: Secondary | ICD-10-CM | POA: Diagnosis not present

## 2022-02-10 DIAGNOSIS — M7122 Synovial cyst of popliteal space [Baker], left knee: Secondary | ICD-10-CM | POA: Diagnosis not present

## 2022-02-10 DIAGNOSIS — M1712 Unilateral primary osteoarthritis, left knee: Secondary | ICD-10-CM | POA: Diagnosis not present

## 2022-02-14 ENCOUNTER — Other Ambulatory Visit: Payer: Self-pay | Admitting: Internal Medicine

## 2022-02-15 NOTE — Telephone Encounter (Signed)
Requested Prescriptions  Pending Prescriptions Disp Refills   pravastatin (PRAVACHOL) 40 MG tablet [Pharmacy Med Name: Pravastatin Sodium 40 MG Oral Tablet] 90 tablet 3    Sig: TAKE 1 TABLET BY MOUTH DAILY     Cardiovascular:  Antilipid - Statins Failed - 02/14/2022 10:41 PM      Failed - Lipid Panel in normal range within the last 12 months    Cholesterol, Total  Date Value Ref Range Status  10/17/2014 179 100 - 199 mg/dL Final   Cholesterol  Date Value Ref Range Status  01/18/2022 170 <200 mg/dL Final   LDL Cholesterol (Calc)  Date Value Ref Range Status  01/18/2022 79 mg/dL (calc) Final    Comment:    Reference range: <100 . Desirable range <100 mg/dL for primary prevention;   <70 mg/dL for patients with CHD or diabetic patients  with > or = 2 CHD risk factors. Marland Kitchen LDL-C is now calculated using the Martin-Hopkins  calculation, which is a validated novel method providing  better accuracy than the Friedewald equation in the  estimation of LDL-C.  Cresenciano Genre et al. Annamaria Helling. 0947;096(28): 2061-2068  (http://education.[employer].com/faq/FAQ164)    Direct LDL  Date Value Ref Range Status  09/05/2013 169.6 mg/dL Final    Comment:    Optimal:  <100 mg/dLNear or Above Optimal:  100-129 mg/dLBorderline High:  130-159 mg/dLHigh:  160-189 mg/dLVery High:  >190 mg/dL   HDL  Date Value Ref Range Status  01/18/2022 75 > OR = 50 mg/dL Final  10/17/2014 59 >39 mg/dL Final    Comment:    According to ATP-III Guidelines, HDL-C >59 mg/dL is considered a negative risk factor for CHD.    Triglycerides  Date Value Ref Range Status  01/18/2022 79 <150 mg/dL Final         Passed - Patient is not pregnant      Passed - Valid encounter within last 12 months    Recent Outpatient Visits           4 weeks ago Encounter for general adult medical examination with abnormal findings   [facility] [place], Coralie Keens, NP   2 months ago Right hip pain   [facility] [place], [place] W, NP   7 months ago Type 2 diabetes mellitus with hyperglycemia, without long-term current use of insulin [facility])   [facility] [place], Coralie Keens, NP   8 months ago Aortic atherosclerosis [facility])   [facility] [place], Coralie Keens, NP   8 months ago Primary hypertension   [place], RPH-CPP       Future Appointments             In 4 months Baity, Coralie Keens, NP [facility], [facility] [place]

## 2022-02-18 DIAGNOSIS — M1712 Unilateral primary osteoarthritis, left knee: Secondary | ICD-10-CM | POA: Diagnosis not present

## 2022-02-21 DIAGNOSIS — E1122 Type 2 diabetes mellitus with diabetic chronic kidney disease: Secondary | ICD-10-CM | POA: Diagnosis not present

## 2022-02-21 DIAGNOSIS — D631 Anemia in chronic kidney disease: Secondary | ICD-10-CM | POA: Diagnosis not present

## 2022-02-21 DIAGNOSIS — N1831 Chronic kidney disease, stage 3a: Secondary | ICD-10-CM | POA: Diagnosis not present

## 2022-02-21 DIAGNOSIS — I129 Hypertensive chronic kidney disease with stage 1 through stage 4 chronic kidney disease, or unspecified chronic kidney disease: Secondary | ICD-10-CM | POA: Diagnosis not present

## 2022-03-11 DIAGNOSIS — M25562 Pain in left knee: Secondary | ICD-10-CM | POA: Diagnosis not present

## 2022-03-11 DIAGNOSIS — M25462 Effusion, left knee: Secondary | ICD-10-CM | POA: Diagnosis not present

## 2022-03-17 DIAGNOSIS — M25462 Effusion, left knee: Secondary | ICD-10-CM | POA: Diagnosis not present

## 2022-03-17 DIAGNOSIS — M25562 Pain in left knee: Secondary | ICD-10-CM | POA: Diagnosis not present

## 2022-03-18 ENCOUNTER — Other Ambulatory Visit: Payer: Self-pay | Admitting: Internal Medicine

## 2022-03-18 NOTE — Telephone Encounter (Signed)
Requested Prescriptions  Pending Prescriptions Disp Refills   hydrALAZINE (APRESOLINE) 100 MG tablet [Pharmacy Med Name: hydrALAZINE HCl 100 MG Oral Tablet] 300 tablet 0    Sig: TAKE 1 TABLET BY MOUTH 3 TIMES  DAILY     Cardiovascular:  Vasodilators Failed - 03/18/2022  5:13 AM      Failed - ANA Screen, Ifa, Serum in normal range and within 360 days    Anti Nuclear Antibody(ANA)  Date Value Ref Range Status  10/17/2014 Negative Negative Final         Passed - HCT in normal range and within 360 days    HCT  Date Value Ref Range Status  01/18/2022 36.5 35.0 - 45.0 % Final  10/12/2013 40.5 35.0 - 47.0 % Final  08/13/2009 36.8 34.8 - 46.6 % Final         Passed - HGB in normal range and within 360 days    Hemoglobin  Date Value Ref Range Status  01/18/2022 12.3 11.7 - 15.5 g/dL Final   HGB  Date Value Ref Range Status  10/12/2013 13.5 12.0 - 16.0 g/dL Final  08/13/2009 12.8 11.6 - 15.9 g/dL Final         Passed - RBC in normal range and within 360 days    RBC  Date Value Ref Range Status  01/18/2022 4.11 3.80 - 5.10 Million/uL Final         Passed - WBC in normal range and within 360 days    WBC  Date Value Ref Range Status  01/18/2022 4.1 3.8 - 10.8 Thousand/uL Final         Passed - PLT in normal range and within 360 days    Platelets  Date Value Ref Range Status  01/18/2022 266 140 - 400 Thousand/uL Final  08/13/2009 271 145 - 400 10e3/uL Final   Platelet  Date Value Ref Range Status  10/12/2013 278 150 - 440 x10 3/mm 3 Final         Passed - Last BP in normal range    BP Readings from Last 1 Encounters:  01/18/22 118/82         Passed - Valid encounter within last 12 months    Recent Outpatient Visits           1 month ago Encounter for general adult medical examination with abnormal findings   [facility] [place], Coralie Keens, NP   3 months ago Right hip pain   [facility] [place], Coralie Keens, NP   8  months ago Type 2 diabetes mellitus with hyperglycemia, without long-term current use of insulin [facility])   [facility] Jearld Fenton, NP   9 months ago Aortic atherosclerosis [facility])   [facility] [place], Coralie Keens, NP   9 months ago Primary hypertension   [place], Virl Diamond, RPH-CPP       Future Appointments             In 3 months Baity, Coralie Keens, NP [facility], [facility]

## 2022-03-31 DIAGNOSIS — M25562 Pain in left knee: Secondary | ICD-10-CM | POA: Diagnosis not present

## 2022-03-31 DIAGNOSIS — M25462 Effusion, left knee: Secondary | ICD-10-CM | POA: Diagnosis not present

## 2022-04-01 ENCOUNTER — Other Ambulatory Visit: Payer: Self-pay | Admitting: Internal Medicine

## 2022-04-01 DIAGNOSIS — I10 Essential (primary) hypertension: Secondary | ICD-10-CM

## 2022-04-01 NOTE — Telephone Encounter (Signed)
Requested Prescriptions  Pending Prescriptions Disp Refills   amLODipine (NORVASC) 10 MG tablet [Pharmacy Med Name: AMLODIPINE BESYLATE 10MG  TABLETS] 90 tablet 0    Sig: TAKE 1 TABLET(10 MG) BY MOUTH DAILY     Cardiovascular: Calcium Channel Blockers 2 Passed - 04/01/2022 10:06 AM      Passed - Last BP in normal range    BP Readings from Last 1 Encounters:  01/18/22 118/82         Passed - Last Heart Rate in normal range    Pulse Readings from Last 1 Encounters:  01/18/22 80         Passed - Valid encounter within last 6 months    Recent Outpatient Visits           2 months ago Encounter for general adult medical examination with abnormal findings   [facility] [place], Coralie Keens, NP   4 months ago Right hip pain   [facility] [place], [place] W, NP   8 months ago Type 2 diabetes mellitus with hyperglycemia, without long-term current use of insulin [facility])   [facility] [place], Coralie Keens, NP   9 months ago Aortic atherosclerosis [facility])   [facility] [place], Coralie Keens, NP   9 months ago Primary hypertension   [place], RPH-CPP       Future Appointments             In 2 months Baity, Coralie Keens, NP [facility], [facility]

## 2022-04-04 DIAGNOSIS — I129 Hypertensive chronic kidney disease with stage 1 through stage 4 chronic kidney disease, or unspecified chronic kidney disease: Secondary | ICD-10-CM | POA: Diagnosis not present

## 2022-04-04 DIAGNOSIS — E1122 Type 2 diabetes mellitus with diabetic chronic kidney disease: Secondary | ICD-10-CM | POA: Diagnosis not present

## 2022-04-04 DIAGNOSIS — N1831 Chronic kidney disease, stage 3a: Secondary | ICD-10-CM | POA: Diagnosis not present

## 2022-04-04 DIAGNOSIS — D631 Anemia in chronic kidney disease: Secondary | ICD-10-CM | POA: Diagnosis not present

## 2022-04-08 DIAGNOSIS — M25562 Pain in left knee: Secondary | ICD-10-CM | POA: Diagnosis not present

## 2022-04-08 DIAGNOSIS — M25462 Effusion, left knee: Secondary | ICD-10-CM | POA: Diagnosis not present

## 2022-04-11 DIAGNOSIS — I10 Essential (primary) hypertension: Secondary | ICD-10-CM | POA: Diagnosis not present

## 2022-04-11 DIAGNOSIS — N182 Chronic kidney disease, stage 2 (mild): Secondary | ICD-10-CM | POA: Diagnosis not present

## 2022-04-11 DIAGNOSIS — D631 Anemia in chronic kidney disease: Secondary | ICD-10-CM | POA: Diagnosis not present

## 2022-04-17 ENCOUNTER — Other Ambulatory Visit: Payer: Self-pay | Admitting: Internal Medicine

## 2022-04-18 NOTE — Telephone Encounter (Signed)
Requested Prescriptions  Refused Prescriptions Disp Refills   irbesartan (AVAPRO) 300 MG tablet [Pharmacy Med Name: Irbesartan 300 MG Oral Tablet] 100 tablet 2    Sig: TAKE 1 TABLET BY MOUTH DAILY     Cardiovascular:  Angiotensin Receptor Blockers Failed - 04/17/2022 10:38 PM      Failed - Cr in normal range and within 180 days    Creat  Date Value Ref Range Status  01/18/2022 1.08 (H) 0.50 - 1.05 mg/dL Final   Creatinine,U  Date Value Ref Range Status  04/03/2014 447.0 mg/dL Final   Creatinine, Urine  Date Value Ref Range Status  01/18/2022 172 20 - 275 mg/dL Final         Passed - K in normal range and within 180 days    Potassium  Date Value Ref Range Status  01/18/2022 4.2 3.5 - 5.3 mmol/L Final  10/12/2013 3.9 3.5 - 5.1 mmol/L Final         Passed - Patient is not pregnant      Passed - Last BP in normal range    BP Readings from Last 1 Encounters:  01/18/22 118/82         Passed - Valid encounter within last 6 months    Recent Outpatient Visits           3 months ago Encounter for general adult medical examination with abnormal findings   [facility] [place], Coralie Keens, NP   4 months ago Right hip pain   [facility] [place], Coralie Keens, NP   9 months ago Type 2 diabetes mellitus with hyperglycemia, without long-term current use of insulin [facility])   [facility] [place], Coralie Keens, NP   10 months ago Aortic atherosclerosis [facility])   [facility] [place], Coralie Keens, NP   10 months ago Primary hypertension   [place], RPH-CPP       Future Appointments             In 2 months Baity, Coralie Keens, NP [facility], [facility]

## 2022-04-22 DIAGNOSIS — M25562 Pain in left knee: Secondary | ICD-10-CM | POA: Diagnosis not present

## 2022-04-22 DIAGNOSIS — M25462 Effusion, left knee: Secondary | ICD-10-CM | POA: Diagnosis not present

## 2022-04-29 DIAGNOSIS — M25562 Pain in left knee: Secondary | ICD-10-CM | POA: Diagnosis not present

## 2022-04-29 DIAGNOSIS — M25462 Effusion, left knee: Secondary | ICD-10-CM | POA: Diagnosis not present

## 2022-04-30 ENCOUNTER — Other Ambulatory Visit: Payer: Self-pay | Admitting: Internal Medicine

## 2022-05-02 ENCOUNTER — Other Ambulatory Visit: Payer: Self-pay | Admitting: Internal Medicine

## 2022-05-02 ENCOUNTER — Encounter: Payer: Self-pay | Admitting: Internal Medicine

## 2022-05-02 NOTE — Telephone Encounter (Signed)
Requested Prescriptions  Pending Prescriptions Disp Refills   ONETOUCH ULTRA test strip [Pharmacy Med Name: OneTouch Ultra In Vitro Strip] 200 strip 2    Sig: USE TO CHECK BLOOD SUGAR TWICE  DAILY AS DIRECTED     Endocrinology: Diabetes - Testing Supplies Passed - 05/02/2022  2:00 PM      Passed - Valid encounter within last 12 months    Recent Outpatient Visits           3 months ago Encounter for general adult medical examination with abnormal findings   [place] [facility] [place], Salvadore Oxford, NP   5 months ago Right hip pain   [place] [facility] [place], [place], NP   10 months ago Type 2 diabetes mellitus with hyperglycemia, without long-term current use of insulin [facility])   [place] [facility] [place], Salvadore Oxford, NP   10 months ago Aortic atherosclerosis [facility])   [place] [facility] [place], Salvadore Oxford, NP   10 months ago Primary hypertension   [place] [facility] [place], Jackelyn Poling, RPH-CPP       Future Appointments             In 1 month Baity, Salvadore Oxford, NP [place] [facility], [facility]

## 2022-05-03 ENCOUNTER — Ambulatory Visit
Admission: RE | Admit: 2022-05-03 | Discharge: 2022-05-03 | Disposition: A | Discharge status: Home / Self Care | Payer: Medicare Other | Source: Ambulatory Visit | Attending: Internal Medicine | Admitting: Internal Medicine

## 2022-05-03 ENCOUNTER — Ambulatory Visit (INDEPENDENT_AMBULATORY_CARE_PROVIDER_SITE_OTHER): Payer: Medicare Other | Admitting: Internal Medicine

## 2022-05-03 ENCOUNTER — Encounter: Payer: Self-pay | Admitting: Internal Medicine

## 2022-05-03 ENCOUNTER — Ambulatory Visit
Admission: RE | Admit: 2022-05-03 | Discharge: 2022-05-03 | Disposition: A | Discharge status: Home / Self Care | Payer: Medicare Other | Attending: Internal Medicine | Admitting: Internal Medicine

## 2022-05-03 VITALS — BP 118/72 | HR 76 | Temp 96.9°F | Wt 205.0 lb

## 2022-05-03 DIAGNOSIS — R531 Weakness: Secondary | ICD-10-CM | POA: Diagnosis not present

## 2022-05-03 DIAGNOSIS — R202 Paresthesia of skin: Secondary | ICD-10-CM | POA: Insufficient documentation

## 2022-05-03 DIAGNOSIS — R404 Transient alteration of awareness: Secondary | ICD-10-CM

## 2022-05-03 DIAGNOSIS — I951 Orthostatic hypotension: Secondary | ICD-10-CM | POA: Diagnosis not present

## 2022-05-03 DIAGNOSIS — R519 Headache, unspecified: Secondary | ICD-10-CM

## 2022-05-03 DIAGNOSIS — G8929 Other chronic pain: Secondary | ICD-10-CM | POA: Insufficient documentation

## 2022-05-03 DIAGNOSIS — R42 Dizziness and giddiness: Secondary | ICD-10-CM | POA: Diagnosis not present

## 2022-05-03 DIAGNOSIS — R55 Syncope and collapse: Secondary | ICD-10-CM

## 2022-05-03 DIAGNOSIS — H539 Unspecified visual disturbance: Secondary | ICD-10-CM

## 2022-05-03 DIAGNOSIS — R410 Disorientation, unspecified: Secondary | ICD-10-CM

## 2022-05-03 DIAGNOSIS — E119 Type 2 diabetes mellitus without complications: Secondary | ICD-10-CM | POA: Diagnosis not present

## 2022-05-03 DIAGNOSIS — M542 Cervicalgia: Secondary | ICD-10-CM | POA: Insufficient documentation

## 2022-05-03 DIAGNOSIS — M47812 Spondylosis without myelopathy or radiculopathy, cervical region: Secondary | ICD-10-CM | POA: Diagnosis not present

## 2022-05-03 DIAGNOSIS — E559 Vitamin D deficiency, unspecified: Secondary | ICD-10-CM | POA: Diagnosis not present

## 2022-05-03 NOTE — Telephone Encounter (Signed)
Requested Prescriptions  Pending Prescriptions Disp Refills   Lancets (ONETOUCH DELICA PLUS LANCET33G) MISC [Pharmacy Med Name: OneTouch Delica Plus Lancet33G] 200 each 2    Sig: CHECK BLOOD SUGAR TWICE DAILY     Endocrinology: Diabetes - Testing Supplies Passed - 05/02/2022  7:45 AM      Passed - Valid encounter within last 12 months    Recent Outpatient Visits           Today Frequent headaches   [place] [facility] [place], Salvadore Oxford, NP   3 months ago Encounter for general adult medical examination with abnormal findings   [place] [place], Salvadore Oxford, NP   5 months ago Right hip pain   [place] [facility] [place], [place], NP   10 months ago Type 2 diabetes mellitus with hyperglycemia, without long-term current use of insulin [facility])   [place] [facility] [place], Salvadore Oxford, NP   10 months ago Aortic atherosclerosis [facility])   [place] [facility] [place], Salvadore Oxford, NP       Future Appointments             In 1 month Baity, Salvadore Oxford, NP [place] [facility], [facility]

## 2022-05-03 NOTE — Progress Notes (Signed)
Subjective:    Patient ID: Carrie Singh, female    DOB: 07-18-1955, 67 y.o.   MRN: [id number]  HPI  Patient presents to clinic today with complaint of dizziness and weakness.  This started several months ago.  She states it comes and goes but worsening in the last few weeks. She had a near syncopal episode on Sunday. She notices this with position changes. She denies associated sweating, nausea, vomiting, chest pain or SOB. She denies changes in diet  or medications. She drinks some water but thinks she is not drinking enough water.  She also states she has left sided headache and neck pain. This started 8 months ago. She has difficulty describing the pain but reports it is kind of like pressure and pounding. It it occurs, she report she will wake up like this in the morning. She reports feeling disoriented, her husband reports she will stare off and then her neck will start to jerk. She also reports associated pain, weakness, numbness and tingling of the left upper extremity. She denies clenching of the jaw or drooling. She reports these symptoms will last minutes to hours and resolves without intervention. She has not tried anything OTC for this.   Review of Systems     Past Medical History:  Diagnosis Date   Breast cancer (HCC) 01/2021   left breast IDC   Breast cancer, right breast (HCC) 12/2007   a. s/p chemo, radiation, and lumpectomy with negative sentinel lymph node biopsy    Complication of anesthesia    woke up during colonoscopy   Depression    Diabetes mellitus without complication (HCC)    GERD (gastroesophageal reflux disease)    Hyperlipidemia    Hypertension    resistant, negative renal Dopplers and negative CT angiogram   Migraines    Obesity     Current Outpatient Medications  Medication Sig Dispense Refill   amLODipine (NORVASC) 10 MG tablet TAKE 1 TABLET(10 MG) BY MOUTH DAILY 90 tablet 0   aspirin EC 81 MG tablet Take 1 tablet (81 mg total) by mouth daily.  Swallow whole. 30 tablet 12   Biotin w/ Vitamins C & E (HAIR SKIN & NAILS GUMMIES PO) Take by mouth.     calcium carbonate (OSCAL) 1500 (600 Ca) MG TABS tablet Take by mouth 2 (two) times daily with a meal.     carvedilol (COREG) 12.5 MG tablet Take 12.5 mg by mouth in the morning and at bedtime.     hydrALAZINE (APRESOLINE) 100 MG tablet TAKE 1 TABLET BY MOUTH 3 TIMES  DAILY 300 tablet 0   irbesartan (AVAPRO) 300 MG tablet TAKE 1 TABLET BY MOUTH DAILY 90 tablet 1   Lancets (ONETOUCH DELICA PLUS LANCET33G) MISC CHECK BLOOD SUGAR TWICE  DAILY 200 each 3   letrozole (FEMARA) 2.5 MG tablet TAKE 1 TABLET BY MOUTH DAILY 90 tablet 3   metFORMIN (GLUCOPHAGE) 1000 MG tablet Take 1,000 mg by mouth 2 (two) times daily with a meal.     Multiple Vitamins-Minerals (ONE-A-DAY WOMENS VITACRAVES) CHEW Chew 1 tablet by mouth daily.      ONETOUCH ULTRA test strip USE TO CHECK BLOOD SUGAR TWICE  DAILY AS DIRECTED 200 strip 2   pravastatin (PRAVACHOL) 40 MG tablet TAKE 1 TABLET BY MOUTH DAILY 90 tablet 3   repaglinide (PRANDIN) 0.5 MG tablet Take 0.5 mg by mouth 3 (three) times daily before meals.     No current facility-administered medications for this visit.  No Known Allergies  Family History  Problem Relation Age of Onset   Stroke Mother    Colon cancer Father 26   Heart disease Father    Asthma Father    Lung cancer Sister    Colon cancer Brother 5   Diabetes Brother    Breast cancer Niece        dx. <50   Breast cancer Niece        dx. <50   Breast cancer Niece        dx. >50   Esophageal cancer Neg Hx    Rectal cancer Neg Hx    Stomach cancer Neg Hx     Social History   Socioeconomic History   Marital status: Married    Spouse name: Tynia Wiers   Number of children: 3   Years of education: 12+   Highest education level: Not on file  Occupational History   Occupation: Disabled  Tobacco Use   Smoking status: Never   Smokeless tobacco: Never  Vaping Use   Vaping Use: Never  used  Substance and Sexual Activity   Alcohol use: No    Alcohol/week: 0.0 standard drinks of alcohol   Drug use: No   Sexual activity: Yes  Other Topics Concern   Not on file  Social History Narrative   Lives at home with husband.   Caffeine use: 1-2 drinks per month         Epworth Sleepiness Scale = 15 (as of 01/01/15)   Social Determinants of Health   Financial Resource Strain: Low Risk  (07/08/2021)   Overall Financial Resource Strain (CARDIA)    Difficulty of Paying Living Expenses: Not hard at all  Food Insecurity: No Food Insecurity (07/08/2021)   Hunger Vital Sign    Worried About Running Out of Food in the Last Year: Never true    Ran Out of Food in the Last Year: Never true  Transportation Needs: No Transportation Needs (07/08/2021)   PRAPARE - Administrator, Civil Service (Medical): No    Lack of Transportation (Non-Medical): No  Physical Activity: Not on file  Stress: No Stress Concern Present (07/08/2021)   [employer] of Occupational Health - Occupational Stress Questionnaire    Feeling of Stress : Not at all  Social Connections: Socially Integrated (07/08/2021)   Social Connection and Isolation Panel [NHANES]    Frequency of Communication with Friends and Family: More than three times a week    Frequency of Social Gatherings with Friends and Family: Once a week    Attends Religious Services: More than 4 times per year    Active Member of [employer] or Organizations: Yes    Attends Engineer, structural: More than 4 times per year    Marital Status: Married  Catering manager Violence: Not At Risk (07/08/2021)   Humiliation, Afraid, Rape, and Kick questionnaire    Fear of Current or Ex-Partner: No    Emotionally Abused: No    Physically Abused: No    Sexually Abused: No     Constitutional: Patient reports intermittent headaches.  Denies fever, malaise, fatigue, or abrupt weight changes.  HEENT: Patient reports intermittent vision changes,  staring episodes.  Denies eye pain, eye redness, ear pain, ringing in the ears, wax buildup, runny nose, nasal congestion, bloody nose, or sore throat. Respiratory: Denies difficulty breathing, shortness of breath, cough or sputum production.   Cardiovascular: Denies chest pain, chest tightness, palpitations or swelling in the hands or  feet.  Musculoskeletal: Patient reports intermittent weakness, chronic neck pain.  Denies decrease in range of motion, difficulty with gait, or joint swelling.  Skin: Denies redness, rashes, lesions or ulcercations.  Neurological: Patient reports intermittent dizziness, near syncope, disorientation and paresthesia left upper extremity.  Denies difficulty with memory, difficulty with speech or problems with balance and coordination.  Psych: Patient has a history of depression.  Denies anxiety, SI/HI.  No other specific complaints in a complete review of systems (except as listed in HPI above).  Objective:   Physical Exam  BP 118/72 (BP Location: Left Arm, Patient Position: Sitting, Cuff Size: Large)   Pulse 76   Temp (!) 96.9 F (36.1 C) (Temporal)   Wt 205 lb (93 kg)   SpO2 99%   BMI 34.11 kg/m   Wt Readings from Last 3 Encounters:  01/18/22 210 lb (95.3 kg)  12/27/21 211 lb 9.6 oz (96 kg)  11/25/21 213 lb (96.6 kg)    General: Appears her stated age, obese, in NAD. Skin: Warm, dry and intact.  HEENT: Head: normal shape and size; Eyes: sclera white, no icterus, conjunctiva pink, PERRLA and EOMs intact; Ears: Tm's gray and intact, normal light reflex;  Cardiovascular: Normal rate and rhythm. S1,S2 noted.  No murmur, rubs or gallops noted.  Pulmonary/Chest: Normal effort and positive vesicular breath sounds. No respiratory distress. No wheezes, rales or ronchi noted.  Musculoskeletal: Normal flexion, extension, rotation to the right and lateral bending to the right of the cervical spine.  Decreased rotation to the left and lateral bending to the left of  the cervical spine.  No bony tenderness noted over the cervical spine.  Shoulder shrug equal.  Normal internal and external rotation of the left shoulder.  Shoulder shrug equal.  Strength 5/5 BUE.  Handgrips equal.  No difficulty with gait.  Neurological: Alert and oriented. Cranial nerves II-XII grossly intact. Coordination normal.  Psychiatric: Mood and affect normal. Behavior is normal. Judgment and thought content normal.   BMET    Component Value Date/Time   NA 140 01/18/2022 0924   NA 146 (H) 01/20/2015 1457   NA 143 10/12/2013 1510   K 4.2 01/18/2022 0924   K 3.9 10/12/2013 1510   CL 104 01/18/2022 0924   CL 109 (H) 10/12/2013 1510   CO2 29 01/18/2022 0924   CO2 29 10/12/2013 1510   GLUCOSE 92 01/18/2022 0924   GLUCOSE 92 10/12/2013 1510   BUN 18 01/18/2022 0924   BUN 17 01/20/2015 1457   BUN 16 10/12/2013 1510   CREATININE 1.08 (H) 01/18/2022 0924   CALCIUM 10.1 01/18/2022 0924   CALCIUM 9.4 10/12/2013 1510   GFRNONAA 57 (L) 10/13/2021 0013   GFRNONAA 56 (L) 10/12/2013 1510   GFRAA >60 01/24/2018 2159   GFRAA >60 10/12/2013 1510    Lipid Panel     Component Value Date/Time   CHOL 170 01/18/2022 0924   CHOL 179 10/17/2014 1527   TRIG 79 01/18/2022 0924   HDL 75 01/18/2022 0924   HDL 59 10/17/2014 1527   CHOLHDL 2.3 01/18/2022 0924   VLDL 15.2 01/09/2020 0957   LDLCALC 79 01/18/2022 0924    CBC    Component Value Date/Time   WBC 4.1 01/18/2022 0924   RBC 4.11 01/18/2022 0924   HGB 12.3 01/18/2022 0924   HGB 13.5 10/12/2013 1510   HGB 12.8 08/13/2009 0946   HCT 36.5 01/18/2022 0924   HCT 40.5 10/12/2013 1510   HCT 36.8 08/13/2009 0946  PLT 266 01/18/2022 0924   PLT 278 10/12/2013 1510   PLT 271 08/13/2009 0946   MCV 88.8 01/18/2022 0924   MCV 87 10/12/2013 1510   MCV 87.0 08/13/2009 0946   MCH 29.9 01/18/2022 0924   MCHC 33.7 01/18/2022 0924   RDW 12.8 01/18/2022 0924   RDW 13.6 10/12/2013 1510   RDW 13.8 08/13/2009 0946   LYMPHSABS 0.8  10/13/2021 0013   LYMPHSABS 1.5 08/13/2009 0946   MONOABS 0.7 10/13/2021 0013   MONOABS 0.2 08/13/2009 0946   EOSABS 0.0 10/13/2021 0013   EOSABS 0.1 08/13/2009 0946   BASOSABS 0.0 10/13/2021 0013   BASOSABS 0.0 08/13/2009 0946    Hgb A1C Lab Results  Component Value Date   HGBA1C 5.9 (H) 01/18/2022           Assessment & Plan:   Generalized Weakness, Intermittent Dizziness, Near Syncope, Frequent Headaches, Vision Changes, Staring Episodes, Disorientation, Chronic Neck Pain, Paresthesia of LUE:  Orthostatics positive Encourage adequate water intake Advised her to make position changes slowly Will check c-Met, TSH, vitamin D and B12 Will obtain x-ray of cervical spine Will obtain MRI brain for further evaluation of symptoms Consider referral to cardiology and neurology for further evaluation pending imaging and labs  RTC in 3 months for follow-up of chronic conditions Nicki Reaper, NP

## 2022-05-03 NOTE — Patient Instructions (Signed)
Orthostatic Hypotension Blood pressure is a measurement of how strongly, or weakly, your circulating blood is pressing against the walls of your arteries. Orthostatic hypotension is a drop in blood pressure that can happen when you change positions, such as when you go from lying down to standing. Arteries are blood vessels that carry blood from your heart throughout your body. When blood pressure is too low, you may not get enough blood to your brain or to the rest of your organs. Orthostatic hypotension can cause light-headedness, sweating, rapid heartbeat, blurred vision, and fainting. These symptoms require further investigation into the cause. What are the causes? Orthostatic hypotension can be caused by many things, including: Sudden changes in posture, such as standing up quickly after you have been sitting or lying down. Loss of blood (anemia) or loss of body fluids (dehydration). Heart problems, neurologic problems, or hormone problems. Pregnancy. Aging. The risk for this condition increases as you get older. Severe infection (sepsis). Certain medicines, such as medicines for high blood pressure or medicines that make the body lose excess fluids (diuretics). What are the signs or symptoms? Symptoms of this condition may include: Weakness, light-headedness, or dizziness. Sweating. Blurred vision. Tiredness (fatigue). Rapid heartbeat. Fainting, in severe cases. How is this diagnosed? This condition is diagnosed based on: Your symptoms and medical history. Your blood pressure measurements. Your health care provider will check your blood pressure when you are: Lying down. Sitting. Standing. A blood pressure reading is recorded as two numbers, such as "120 over 80" (or 120/80). The first ("top") number is called the systolic pressure. It is a measure of the pressure in your arteries as your heart beats. The second ("bottom") number is called the diastolic pressure. It is a measure of  the pressure in your arteries when your heart relaxes between beats. Blood pressure is measured in a unit called mmHg. Healthy blood pressure for most adults is 120/80 mmHg. Orthostatic hypotension is defined as a 20 mmHg drop in systolic pressure or a 10 mmHg drop in diastolic pressure within 3 minutes of standing. Other information or tests that may be used to diagnose orthostatic hypotension include: Your other vital signs, such as your heart rate and temperature. Blood tests. An electrocardiogram (ECG) or echocardiogram. A Holter monitor. This is a device you wear that records your heart rhythm continuously, usually for 24-48 hours. Tilt table test. For this test, you will be safely secured to a table that moves you from a lying position to an upright position. Your heart rhythm and blood pressure will be monitored during the test. How is this treated? This condition may be treated by: Changing your diet. This may involve eating more salt (sodium) or drinking more water. Changing the dosage of certain medicines you are taking that might be lowering your blood pressure. Correcting the underlying reason for the orthostatic hypotension. Wearing compression stockings. Taking medicines to raise your blood pressure. Avoiding actions that trigger symptoms. Follow these instructions at home: Medicines Take over-the-counter and prescription medicines only as told by your health care provider. Follow instructions from your health care provider about changing the dosage of your current medicines, if this applies. Do not stop or adjust any of your medicines on your own. Eating and drinking  Drink enough fluid to keep your urine pale yellow. Eat extra salt only as directed. Do not add extra salt to your diet unless advised by your health care provider. Eat frequent, small meals. Avoid standing up suddenly after eating. General instructions    Get up slowly from lying down or sitting positions. This  gives your blood pressure a chance to adjust. Avoid hot showers and excessive heat as directed by your health care provider. Engage in regular physical activity as directed by your health care provider. If you have compression stockings, wear them as told. Keep all follow-up visits. This is important. Contact a health care provider if: You have a fever for more than 2-3 days. You feel more thirsty than usual. You feel dizzy or weak. Get help right away if: You have chest pain. You have a fast or irregular heartbeat. You become sweaty or feel light-headed. You feel short of breath. You faint. You have any symptoms of a stroke. "BE FAST" is an easy way to remember the main warning signs of a stroke: B - Balance. Signs are dizziness, sudden trouble walking, or loss of balance. E - Eyes. Signs are trouble seeing or a sudden change in vision. F - Face. Signs are sudden weakness or numbness of the face, or the face or eyelid drooping on one side. A - Arms. Signs are weakness or numbness in an arm. This happens suddenly and usually on one side of the body. S - Speech. Signs are sudden trouble speaking, slurred speech, or trouble understanding what people say. T - Time. Time to call emergency services. Write down what time symptoms started. You have other signs of a stroke, such as: A sudden, severe headache with no known cause. Nausea or vomiting. Seizure. These symptoms may represent a serious problem that is an emergency. Do not wait to see if the symptoms will go away. Get medical help right away. Call your local emergency services (911 in the [place] Do not drive yourself to the hospital. Summary Orthostatic hypotension is a sudden drop in blood pressure. It can cause light-headedness, sweating, rapid heartbeat, blurred vision, and fainting. Orthostatic hypotension can be diagnosed by having your blood pressure taken while lying down, sitting, and then standing. Treatment may involve  changing your diet, wearing compression stockings, sitting up slowly, adjusting your medicines, or correcting the underlying reason for the orthostatic hypotension. Get help right away if you have chest pain, a fast or irregular heartbeat, or symptoms of a stroke. This information is not intended to replace advice given to you by your health care provider. Make sure you discuss any questions you have with your health care provider. Document Revised: 03/19/2020 Document Reviewed: 03/19/2020 Elsevier Patient Education  2023 Elsevier Inc.  

## 2022-05-04 LAB — COMPLETE METABOLIC PANEL WITH GFR
AG Ratio: 1.6 (calc) (ref 1.0–2.5)
ALT: 11 U/L (ref 6–29)
AST: 12 U/L (ref 10–35)
Albumin: 4.2 g/dL (ref 3.6–5.1)
Alkaline phosphatase (APISO): 54 U/L (ref 37–153)
BUN/Creatinine Ratio: 21 (calc) (ref 6–22)
BUN: 24 mg/dL (ref 7–25)
CO2: 30 mmol/L (ref 20–32)
Calcium: 10.1 mg/dL (ref 8.6–10.4)
Chloride: 105 mmol/L (ref 98–110)
Creat: 1.16 mg/dL — ABNORMAL HIGH (ref 0.50–1.05)
Globulin: 2.7 g/dL (calc) (ref 1.9–3.7)
Glucose, Bld: 114 mg/dL — ABNORMAL HIGH (ref 65–99)
Potassium: 4.5 mmol/L (ref 3.5–5.3)
Sodium: 143 mmol/L (ref 135–146)
Total Bilirubin: 1 mg/dL (ref 0.2–1.2)
Total Protein: 6.9 g/dL (ref 6.1–8.1)
eGFR: 51 mL/min/{1.73_m2} — ABNORMAL LOW (ref >=60)

## 2022-05-04 LAB — HEMOGLOBIN A1C
Hgb A1c MFr Bld: 6 % of total Hgb — ABNORMAL HIGH (ref <5.7)
Mean Plasma Glucose: 126 mg/dL
eAG (mmol/L): 7 mmol/L

## 2022-05-04 LAB — VITAMIN D 25 HYDROXY (VIT D DEFICIENCY, FRACTURES): Vit D, 25-Hydroxy: 39 ng/mL (ref 30–100)

## 2022-05-04 LAB — TSH: TSH: 2.46 mIU/L (ref 0.40–4.50)

## 2022-05-04 LAB — VITAMIN B12: Vitamin B-12: 267 pg/mL (ref 200–1100)

## 2022-05-09 ENCOUNTER — Ambulatory Visit
Admission: RE | Admit: 2022-05-09 | Discharge: 2022-05-09 | Disposition: A | Discharge status: Home / Self Care | Payer: Medicare Other | Source: Ambulatory Visit | Attending: Internal Medicine | Admitting: Internal Medicine

## 2022-05-09 DIAGNOSIS — R42 Dizziness and giddiness: Secondary | ICD-10-CM | POA: Diagnosis not present

## 2022-05-09 DIAGNOSIS — G8929 Other chronic pain: Secondary | ICD-10-CM | POA: Diagnosis not present

## 2022-05-09 DIAGNOSIS — R202 Paresthesia of skin: Secondary | ICD-10-CM | POA: Diagnosis not present

## 2022-05-09 DIAGNOSIS — M542 Cervicalgia: Secondary | ICD-10-CM | POA: Diagnosis not present

## 2022-05-09 DIAGNOSIS — I951 Orthostatic hypotension: Secondary | ICD-10-CM | POA: Insufficient documentation

## 2022-05-09 DIAGNOSIS — R404 Transient alteration of awareness: Secondary | ICD-10-CM | POA: Diagnosis not present

## 2022-05-09 DIAGNOSIS — R519 Headache, unspecified: Secondary | ICD-10-CM | POA: Insufficient documentation

## 2022-05-10 ENCOUNTER — Encounter: Payer: Self-pay | Admitting: Internal Medicine

## 2022-05-11 ENCOUNTER — Encounter: Payer: Self-pay | Admitting: Internal Medicine

## 2022-05-26 ENCOUNTER — Other Ambulatory Visit: Payer: Self-pay | Admitting: Internal Medicine

## 2022-05-27 NOTE — Telephone Encounter (Signed)
Refilled for mail order  Requested Prescriptions  Pending Prescriptions Disp Refills   hydrALAZINE (APRESOLINE) 100 MG tablet [Pharmacy Med Name: hydrALAZINE HCl 100 MG Oral Tablet] 300 tablet 1    Sig: TAKE 1 TABLET BY MOUTH 3 TIMES  DAILY     Cardiovascular:  Vasodilators Failed - 05/26/2022 10:25 PM      Failed - ANA Screen, Ifa, Serum in normal range and within 360 days    Anti Nuclear Antibody(ANA)  Date Value Ref Range Status  10/17/2014 Negative Negative Final         Passed - HCT in normal range and within 360 days    HCT  Date Value Ref Range Status  01/18/2022 36.5 35.0 - 45.0 % Final  10/12/2013 40.5 35.0 - 47.0 % Final  08/13/2009 36.8 34.8 - 46.6 % Final         Passed - HGB in normal range and within 360 days    Hemoglobin  Date Value Ref Range Status  01/18/2022 12.3 11.7 - 15.5 g/dL Final   HGB  Date Value Ref Range Status  10/12/2013 13.5 12.0 - 16.0 g/dL Final  40/98/1191 47.8 11.6 - 15.9 g/dL Final         Passed - RBC in normal range and within 360 days    RBC  Date Value Ref Range Status  01/18/2022 4.11 3.80 - 5.10 Million/uL Final         Passed - WBC in normal range and within 360 days    WBC  Date Value Ref Range Status  01/18/2022 4.1 3.8 - 10.8 Thousand/uL Final         Passed - PLT in normal range and within 360 days    Platelets  Date Value Ref Range Status  01/18/2022 266 140 - 400 Thousand/uL Final  08/13/2009 271 145 - 400 10e3/uL Final   Platelet  Date Value Ref Range Status  10/12/2013 278 150 - 440 x10 3/mm 3 Final         Passed - Last BP in normal range    BP Readings from Last 1 Encounters:  05/03/22 118/72         Passed - Valid encounter within last 12 months    Recent Outpatient Visits           3 weeks ago Frequent headaches   [facility] [place], Salvadore Oxford, NP   4 months ago Encounter for general adult medical examination with abnormal findings   [place] [facility] [place], Salvadore Oxford, NP   6 months ago Right hip pain   [place] [facility] [place], Salvadore Oxford, NP   10 months ago Type 2 diabetes mellitus with hyperglycemia, without long-term current use of insulin [facility])   [place] [facility] [place], Salvadore Oxford, NP   11 months ago Aortic atherosclerosis [facility])   [place] [facility] [place], Salvadore Oxford, NP       Future Appointments             In 3 weeks [place], Salvadore Oxford, NP [place] [facility], [facility]

## 2022-06-16 DIAGNOSIS — E1122 Type 2 diabetes mellitus with diabetic chronic kidney disease: Secondary | ICD-10-CM | POA: Diagnosis not present

## 2022-06-16 DIAGNOSIS — N1831 Chronic kidney disease, stage 3a: Secondary | ICD-10-CM | POA: Diagnosis not present

## 2022-06-18 IMAGING — MG DIGITAL SCREENING UNILAT LEFT W/ TOMO W/ CAD
4 series · 4 of 12 positions shown · non-contrast
Comparison: Previous exam(s).

CLINICAL DATA: Screening.

EXAM:
DIGITAL SCREENING UNILATERAL LEFT MAMMOGRAM WITH CAD AND
TOMOSYNTHESIS
TECHNIQUE: Left screening digital craniocaudal and mediolateral oblique
mammograms were obtained. Left screening digital breast
tomosynthesis was performed. The images were evaluated with
computer-aided detection.

[L CC synth-2D]
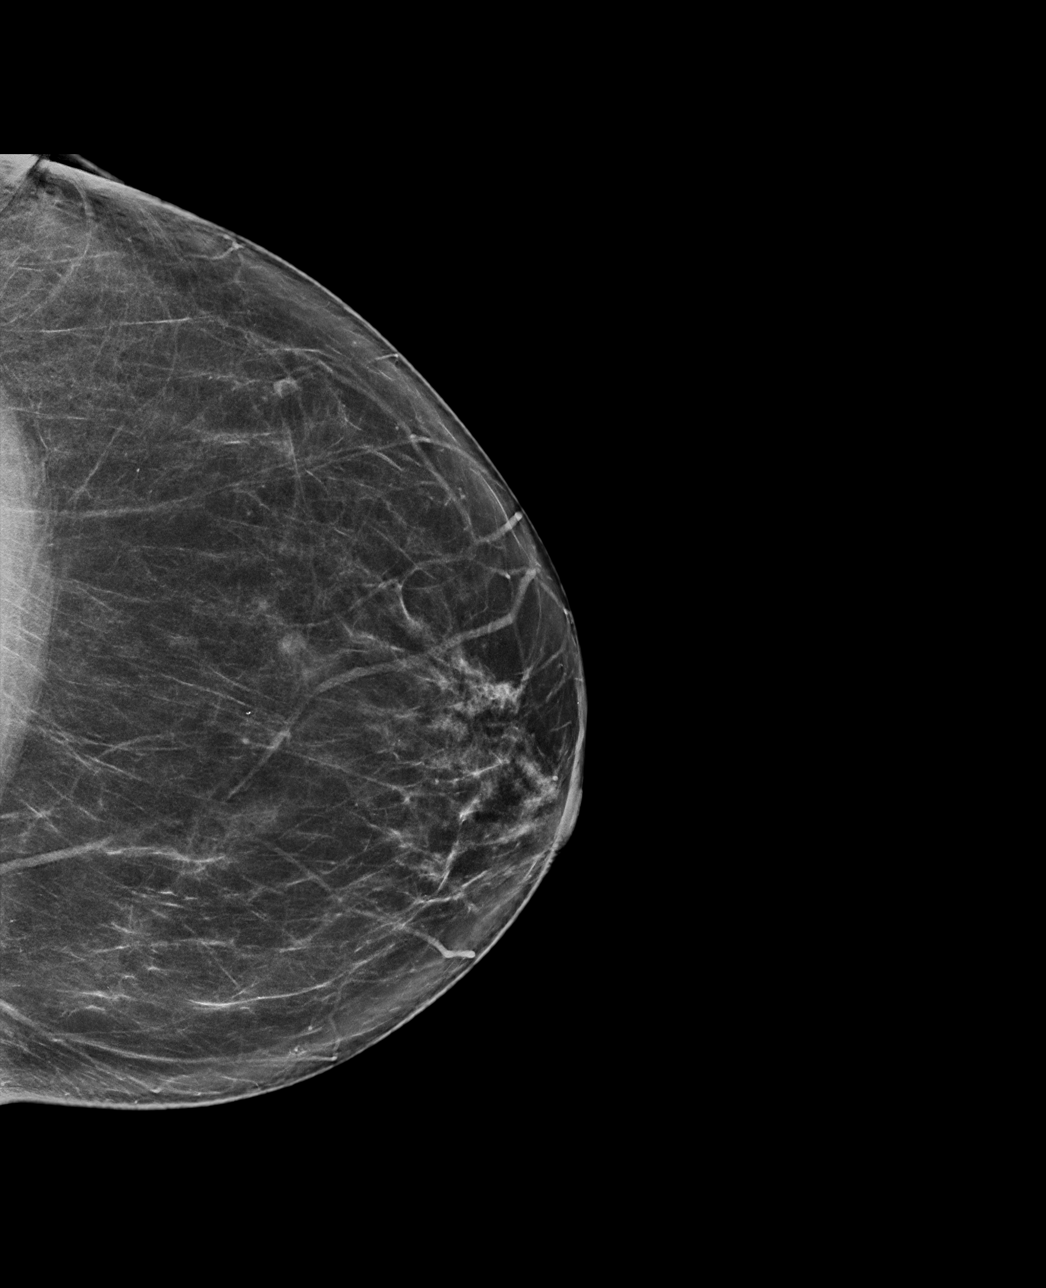

[L MLO synth-2D]
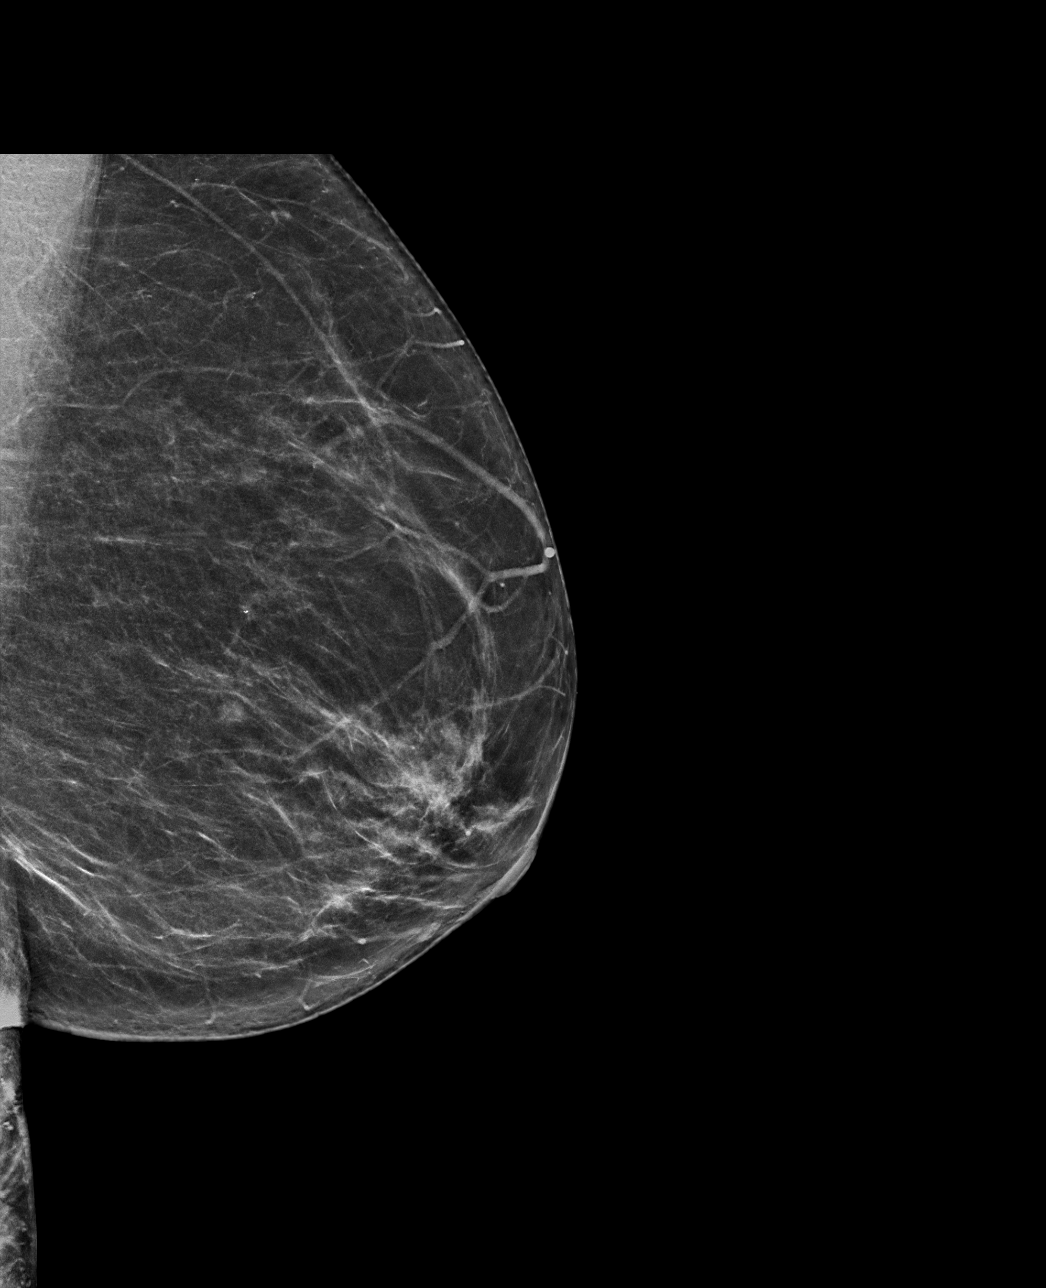

[L MLO tomo · tomo slice 35/70.0]
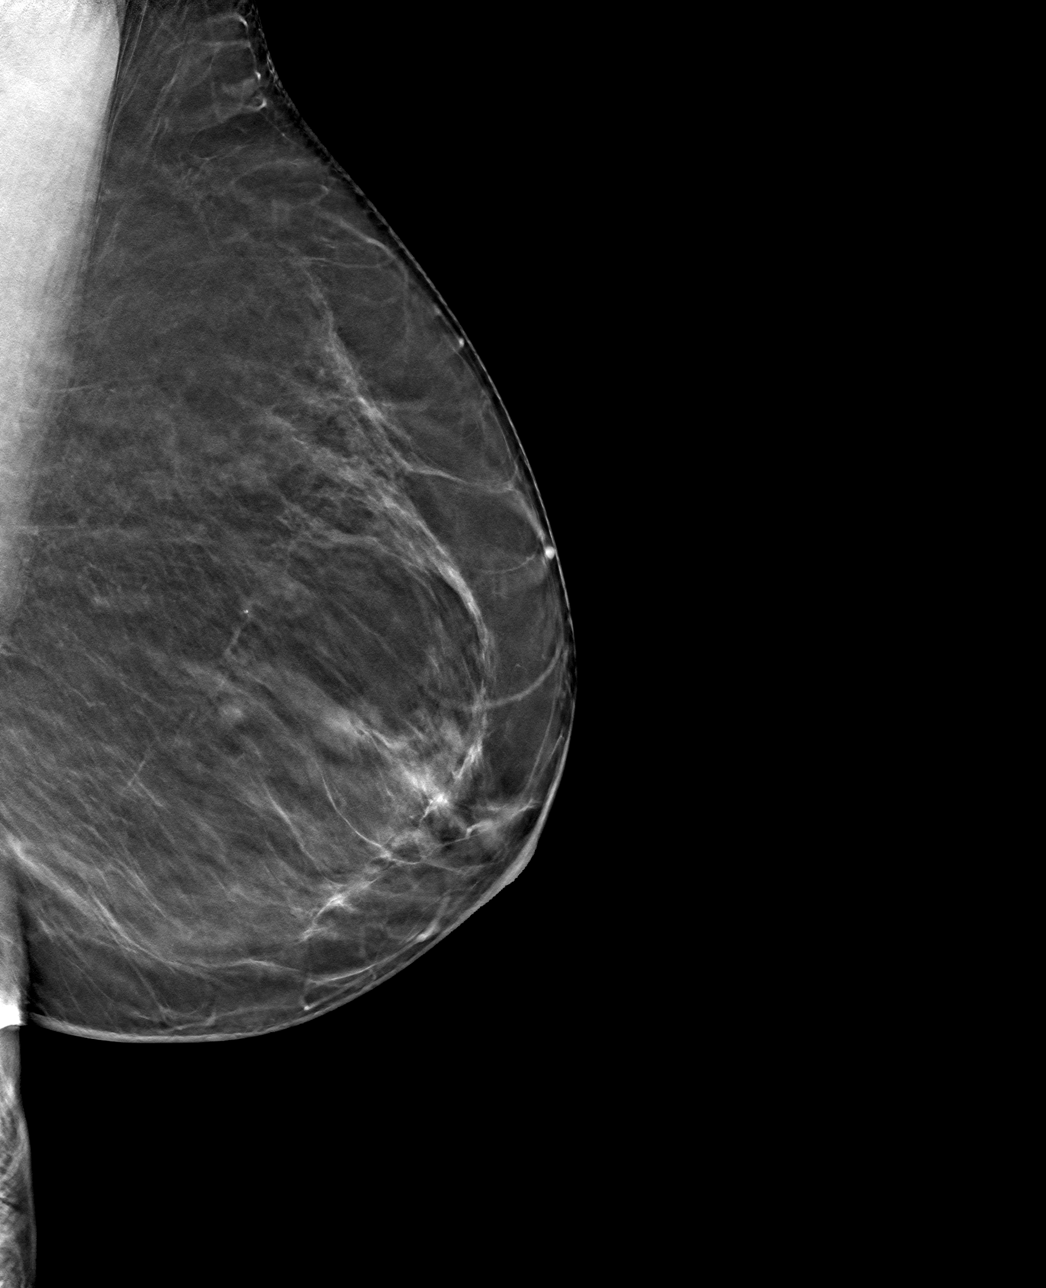

[L CC tomo · tomo slice 37/74.0]
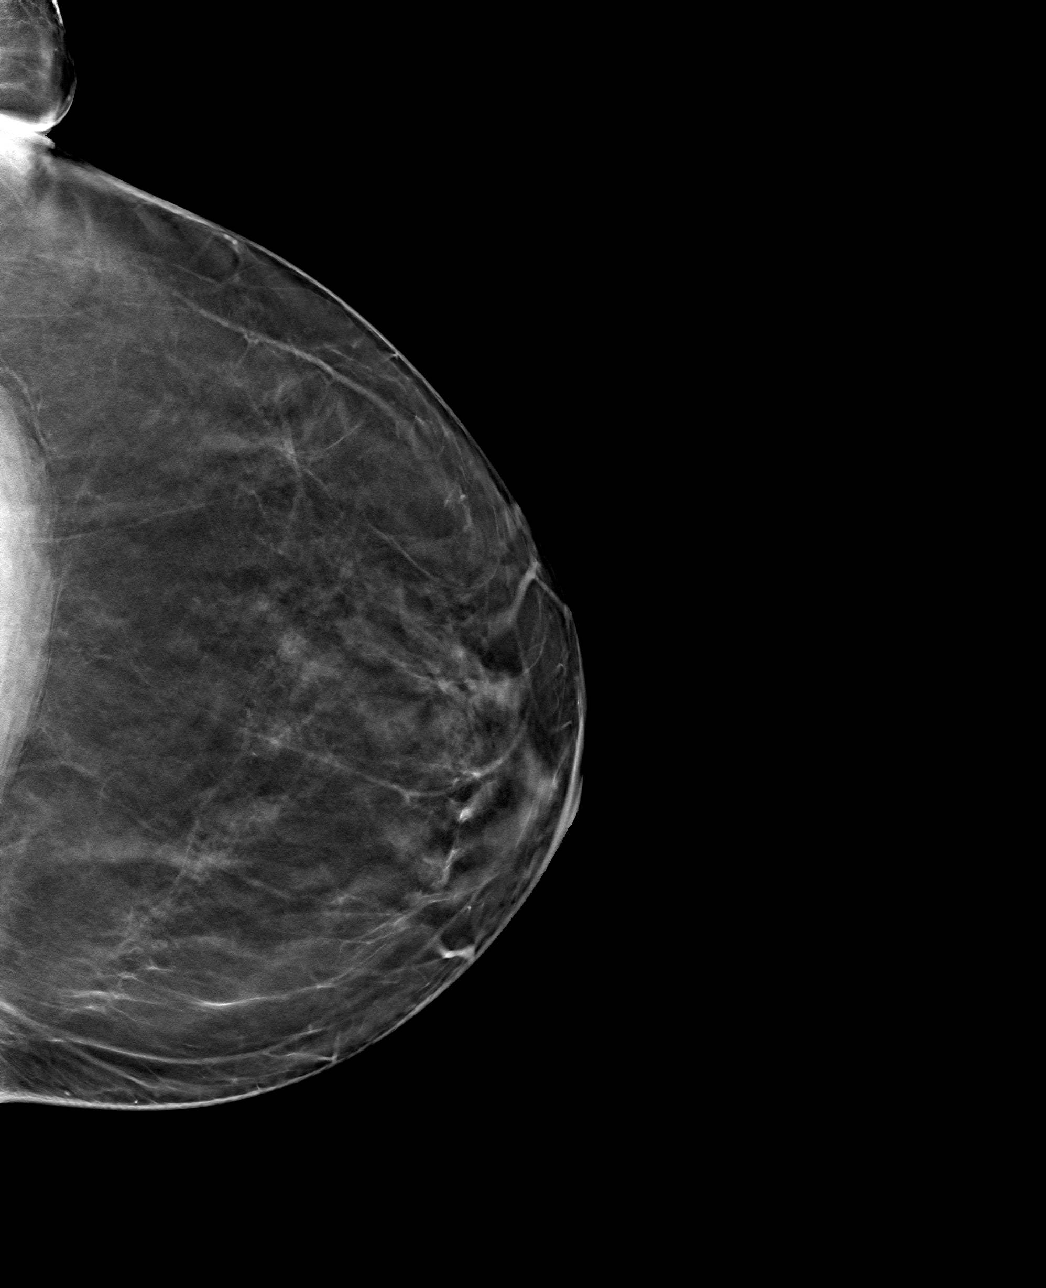

[4 of 12 positions shown; findings below may reference images not displayed]

ACR Breast Density Category b: There are scattered areas of
fibroglandular density.
FINDINGS: In the left breast, a possible mass warrants further evaluation.
IMPRESSION: Further evaluation is suggested for a possible mass in the left
breast.

RECOMMENDATION:
Diagnostic mammogram and possibly ultrasound of the left breast.
(Code:[id number]-[id number])

The patient will be contacted regarding the findings, and additional
imaging will be scheduled.

BI-RADS CATEGORY  0: Incomplete. Need additional imaging evaluation
and/or prior mammograms for comparison.

## 2022-06-20 ENCOUNTER — Ambulatory Visit: Payer: Medicare Other | Admitting: Internal Medicine

## 2022-06-20 NOTE — Progress Notes (Deleted)
Subjective:    Patient ID: Carrie Singh, female    DOB: [birth year], 67 y.o.   MRN: [id number]  HPI  Patient presents to clinic today for follow-up of chronic conditions.  History of Right Breast Cancer: Status postlumpectomy with subsequent mastectomy and chemoradiation.  She is taking Letrozole as prescribed.  She follows with oncology.  Depression: Chronic dysthymia.  She is not currently taking any medications for this.  She is not currently seeing a therapist.  She denies anxiety, SI/HI.  DM2: Her last A1c was 5.6%, 05/2022.  Her sugars range.  She is taking Metformin as prescribed.  She checks her feet routinely.  Her last eye exam was 12/2021.  Flu 08/2021.  Pneumovax 12/2019.  Prevnar 11/2018.  COVID Pfizer x 3.  She follows with endocrinology.  GERD: She is not sure what triggers this.  She is not taking any medications for this.  There is no upper GI on file.  HTN: Her BP today is.  She is taking Hydralazine, Amlodipine, Carvedilol and Irbesartan as prescribed.  ECG from 09/2021 reviewed.  HLD with Aortic Atherosclerosis: Her last LDL was 86, triglycerides 76, 05/2022.  She denies myalgias on Pravastatin.  She tries to consume a low-fat diet.  Migraines: These occur.  Triggered by.  She takes Goody's powders as needed with some relief of symptoms.  She does not follow with neurology.   Review of Systems     Past Medical History:  Diagnosis Date   Breast cancer (HCC) 01/2021   left breast IDC   Breast cancer, right breast (HCC) 12/2007   a. s/p chemo, radiation, and lumpectomy with negative sentinel lymph node biopsy    Complication of anesthesia    woke up during colonoscopy   Depression    Diabetes mellitus without complication (HCC)    GERD (gastroesophageal reflux disease)    Hyperlipidemia    Hypertension    resistant, negative renal Dopplers and negative CT angiogram   Migraines    Obesity     Current Outpatient Medications  Medication Sig Dispense Refill    amLODipine (NORVASC) 10 MG tablet TAKE 1 TABLET(10 MG) BY MOUTH DAILY 90 tablet 0   aspirin EC 81 MG tablet Take 1 tablet (81 mg total) by mouth daily. Swallow whole. 30 tablet 12   Biotin w/ Vitamins C & E (HAIR SKIN & NAILS GUMMIES PO) Take by mouth.     calcium carbonate (OSCAL) 1500 (600 Ca) MG TABS tablet Take by mouth 2 (two) times daily with a meal.     carvedilol (COREG) 12.5 MG tablet Take 12.5 mg by mouth in the morning and at bedtime.     hydrALAZINE (APRESOLINE) 100 MG tablet TAKE 1 TABLET BY MOUTH 3 TIMES  DAILY 300 tablet 1   irbesartan (AVAPRO) 300 MG tablet TAKE 1 TABLET BY MOUTH DAILY 90 tablet 1   Lancets (ONETOUCH DELICA PLUS LANCET33G) MISC CHECK BLOOD SUGAR TWICE DAILY 200 each 2   letrozole (FEMARA) 2.5 MG tablet TAKE 1 TABLET BY MOUTH DAILY 90 tablet 3   metFORMIN (GLUCOPHAGE) 1000 MG tablet Take 1,000 mg by mouth 2 (two) times daily with a meal.     Multiple Vitamins-Minerals (ONE-A-DAY WOMENS VITACRAVES) CHEW Chew 1 tablet by mouth daily.      ONETOUCH ULTRA test strip USE TO CHECK BLOOD SUGAR TWICE  DAILY AS DIRECTED 200 strip 2   pravastatin (PRAVACHOL) 40 MG tablet TAKE 1 TABLET BY MOUTH DAILY 90 tablet 3   repaglinide (  PRANDIN) 0.5 MG tablet Take 0.5 mg by mouth 3 (three) times daily before meals.     No current facility-administered medications for this visit.    No Known Allergies  Family History  Problem Relation Age of Onset   Stroke Mother    Colon cancer Father 65   Heart disease Father    Asthma Father    Lung cancer Sister    Colon cancer Brother 42   Diabetes Brother    Breast cancer Niece        dx. <50   Breast cancer Niece        dx. <50   Breast cancer Niece        dx. >50   Esophageal cancer Neg Hx    Rectal cancer Neg Hx    Stomach cancer Neg Hx     Social History   Socioeconomic History   Marital status: Married    Spouse name: Onika Emig   Number of children: 3   Years of education: 12+   Highest education level: Some  college, no degree  Occupational History   Occupation: Disabled  Tobacco Use   Smoking status: Never   Smokeless tobacco: Never  Vaping Use   Vaping Use: Never used  Substance and Sexual Activity   Alcohol use: No    Alcohol/week: 0.0 standard drinks of alcohol   Drug use: No   Sexual activity: Yes  Other Topics Concern   Not on file  Social History Narrative   Lives at home with husband.   Caffeine use: 1-2 drinks per month         Epworth Sleepiness Scale = 15 (as of 01/01/15)   Social Determinants of Health   Financial Resource Strain: Low Risk  (06/17/2022)   Overall Financial Resource Strain (CARDIA)    Difficulty of Paying Living Expenses: Not hard at all  Food Insecurity: No Food Insecurity (06/17/2022)   Hunger Vital Sign    Worried About Running Out of Food in the Last Year: Never true    Ran Out of Food in the Last Year: Never true  Transportation Needs: No Transportation Needs (06/17/2022)   PRAPARE - Administrator, Civil Service (Medical): No    Lack of Transportation (Non-Medical): No  Physical Activity: Insufficiently Active (06/17/2022)   Exercise Vital Sign    Days of Exercise per Week: 3 days    Minutes of Exercise per Session: 30 min  Stress: No Stress Concern Present (06/17/2022)   [employer] of Occupational Health - Occupational Stress Questionnaire    Feeling of Stress : Not at all  Social Connections: Socially Integrated (06/17/2022)   Social Connection and Isolation Panel [NHANES]    Frequency of Communication with Friends and Family: More than three times a week    Frequency of Social Gatherings with Friends and Family: Twice a week    Attends Religious Services: More than 4 times per year    Active Member of [employer] or Organizations: Yes    Attends Engineer, structural: More than 4 times per year    Marital Status: Married  Catering manager Violence: Not At Risk (07/08/2021)   Humiliation, Afraid, Rape, and Kick  questionnaire    Fear of Current or Ex-Partner: No    Emotionally Abused: No    Physically Abused: No    Sexually Abused: No     Constitutional: Pt reports intermittent headaches. Denies fever, malaise, fatigue, or abrupt weight changes.  HEENT: Denies eye  pain, eye redness, ear pain, ringing in the ears, wax buildup, runny nose, nasal congestion, bloody nose, or sore throat. Respiratory: Denies difficulty breathing, shortness of breath, cough or sputum production.   Cardiovascular: Denies chest pain, chest tightness, palpitations or swelling in the hands or feet.  Gastrointestinal: Denies abdominal pain, bloating, constipation, diarrhea or blood in the stool.  GU: Denies urgency, frequency, pain with urination, burning sensation, blood in urine, odor or discharge. Musculoskeletal: Denies decrease in range of motion, difficulty with gait, muscle pain or joint pain and swelling.  Skin: Denies redness, rashes, lesions or ulcercations.  Neurological: Denies dizziness, difficulty with memory, difficulty with speech or problems with balance and coordination.  Psych: Pt has a history of depression. Denies anxiety, SI/HI.  No other specific complaints in a complete review of systems (except as listed in HPI above).  Objective:   Physical Exam   There were no vitals taken for this visit. Wt Readings from Last 3 Encounters:  05/03/22 205 lb (93 kg)  01/18/22 210 lb (95.3 kg)  12/27/21 211 lb 9.6 oz (96 kg)    General: Appears their stated age, well developed, well nourished in NAD. Skin: Warm, dry and intact. No rashes, lesions or ulcerations noted. HEENT: Head: normal shape and size; Eyes: sclera white, no icterus, conjunctiva pink, PERRLA and EOMs intact; Ears: Tm's gray and intact, normal light reflex; Nose: mucosa pink and moist, septum midline; Throat/Mouth: Teeth present, mucosa pink and moist, no exudate, lesions or ulcerations noted.  Neck:  Neck supple, trachea midline. No masses,  lumps or thyromegaly present.  Cardiovascular: Normal rate and rhythm. S1,S2 noted.  No murmur, rubs or gallops noted. No JVD or BLE edema. No carotid bruits noted. Pulmonary/Chest: Normal effort and positive vesicular breath sounds. No respiratory distress. No wheezes, rales or ronchi noted.  Abdomen: Soft and nontender. Normal bowel sounds. No distention or masses noted. Liver, spleen and kidneys non palpable. Musculoskeletal: Normal range of motion. No signs of joint swelling. No difficulty with gait.  Neurological: Alert and oriented. Cranial nerves II-XII grossly intact. Coordination normal.  Psychiatric: Mood and affect normal. Behavior is normal. Judgment and thought content normal.     BMET    Component Value Date/Time   NA 143 05/03/2022 0932   NA 146 (H) 01/20/2015 1457   NA 143 10/12/2013 1510   K 4.5 05/03/2022 0932   K 3.9 10/12/2013 1510   CL 105 05/03/2022 0932   CL 109 (H) 10/12/2013 1510   CO2 30 05/03/2022 0932   CO2 29 10/12/2013 1510   GLUCOSE 114 (H) 05/03/2022 0932   GLUCOSE 92 10/12/2013 1510   BUN 24 05/03/2022 0932   BUN 17 01/20/2015 1457   BUN 16 10/12/2013 1510   CREATININE 1.16 (H) 05/03/2022 0932   CALCIUM 10.1 05/03/2022 0932   CALCIUM 9.4 10/12/2013 1510   GFRNONAA 57 (L) 10/13/2021 0013   GFRNONAA 56 (L) 10/12/2013 1510   GFRAA >60 01/24/2018 2159   GFRAA >60 10/12/2013 1510    Lipid Panel     Component Value Date/Time   CHOL 170 01/18/2022 0924   CHOL 179 10/17/2014 1527   TRIG 79 01/18/2022 0924   HDL 75 01/18/2022 0924   HDL 59 10/17/2014 1527   CHOLHDL 2.3 01/18/2022 0924   VLDL 15.2 01/09/2020 0957   LDLCALC 79 01/18/2022 0924    CBC    Component Value Date/Time   WBC 4.1 01/18/2022 0924   RBC 4.11 01/18/2022 0924   HGB 12.3  01/18/2022 0924   HGB 13.5 10/12/2013 1510   HGB 12.8 08/13/2009 0946   HCT 36.5 01/18/2022 0924   HCT 40.5 10/12/2013 1510   HCT 36.8 08/13/2009 0946   PLT 266 01/18/2022 0924   PLT 278 10/12/2013  1510   PLT 271 08/13/2009 0946   MCV 88.8 01/18/2022 0924   MCV 87 10/12/2013 1510   MCV 87.0 08/13/2009 0946   MCH 29.9 01/18/2022 0924   MCHC 33.7 01/18/2022 0924   RDW 12.8 01/18/2022 0924   RDW 13.6 10/12/2013 1510   RDW 13.8 08/13/2009 0946   LYMPHSABS 0.8 10/13/2021 0013   LYMPHSABS 1.5 08/13/2009 0946   MONOABS 0.7 10/13/2021 0013   MONOABS 0.2 08/13/2009 0946   EOSABS 0.0 10/13/2021 0013   EOSABS 0.1 08/13/2009 0946   BASOSABS 0.0 10/13/2021 0013   BASOSABS 0.0 08/13/2009 0946    Hgb A1C Lab Results  Component Value Date   HGBA1C 6.0 (H) 05/03/2022           Assessment & Plan:    RTC in 6 months for your annual exam Nicki Reaper, NP

## 2022-06-21 ENCOUNTER — Ambulatory Visit (INDEPENDENT_AMBULATORY_CARE_PROVIDER_SITE_OTHER): Payer: Medicare Other | Admitting: Internal Medicine

## 2022-06-21 ENCOUNTER — Encounter: Payer: Self-pay | Admitting: Internal Medicine

## 2022-06-21 VITALS — BP 130/76 | HR 84 | Temp 96.8°F | Wt 207.0 lb

## 2022-06-21 DIAGNOSIS — Z853 Personal history of malignant neoplasm of breast: Secondary | ICD-10-CM | POA: Diagnosis not present

## 2022-06-21 DIAGNOSIS — F32A Depression, unspecified: Secondary | ICD-10-CM | POA: Diagnosis not present

## 2022-06-21 DIAGNOSIS — E1165 Type 2 diabetes mellitus with hyperglycemia: Secondary | ICD-10-CM | POA: Diagnosis not present

## 2022-06-21 DIAGNOSIS — F450 Somatization disorder: Secondary | ICD-10-CM

## 2022-06-21 DIAGNOSIS — G44011 Episodic cluster headache, intractable: Secondary | ICD-10-CM

## 2022-06-21 DIAGNOSIS — E782 Mixed hyperlipidemia: Secondary | ICD-10-CM

## 2022-06-21 DIAGNOSIS — K219 Gastro-esophageal reflux disease without esophagitis: Secondary | ICD-10-CM | POA: Diagnosis not present

## 2022-06-21 DIAGNOSIS — I10 Essential (primary) hypertension: Secondary | ICD-10-CM

## 2022-06-21 DIAGNOSIS — I700 Atherosclerosis of aorta: Secondary | ICD-10-CM | POA: Diagnosis not present

## 2022-06-21 DIAGNOSIS — M79671 Pain in right foot: Secondary | ICD-10-CM | POA: Diagnosis not present

## 2022-06-21 DIAGNOSIS — G8929 Other chronic pain: Secondary | ICD-10-CM

## 2022-06-21 DIAGNOSIS — E6609 Other obesity due to excess calories: Secondary | ICD-10-CM

## 2022-06-21 DIAGNOSIS — Z6834 Body mass index (BMI) 34.0-34.9, adult: Secondary | ICD-10-CM

## 2022-06-21 NOTE — Assessment & Plan Note (Signed)
Continue letrozole per oncology

## 2022-06-21 NOTE — Assessment & Plan Note (Signed)
Controlled on hydralazine, HCTZ, carvedilol and irbesartan Reinforced DASH diet Kidney function reviewed

## 2022-06-21 NOTE — Assessment & Plan Note (Signed)
Continue Goody powders as needed

## 2022-06-21 NOTE — Assessment & Plan Note (Signed)
Stable off meds Support offered 

## 2022-06-21 NOTE — Assessment & Plan Note (Signed)
Lipid profile reviewed Encouraged to consume low-fat diet Continue pravastatin

## 2022-06-21 NOTE — Assessment & Plan Note (Signed)
Not currently an issue off medications

## 2022-06-21 NOTE — Patient Instructions (Signed)

## 2022-06-21 NOTE — Assessment & Plan Note (Signed)
Lipid profile reviewed Continue pravastatin and aspirin Encouraged her to consume low-fat diet

## 2022-06-21 NOTE — Assessment & Plan Note (Signed)
Will monitor

## 2022-06-21 NOTE — Assessment & Plan Note (Signed)
Encourage diet and exercise for weight loss 

## 2022-06-21 NOTE — Progress Notes (Signed)
Subjective:    Patient ID: Carrie Singh, female    DOB: 1955-05-17, 67 y.o.   MRN: [id number]  HPI  Pt presents to the clinic today for 6 month follow up of chronic conditions.  History of Right Breast Cancer: In remission status postlumpectomy, mastectomy, chemo and radiation.  She is taking Letrozole as prescribed.  She follows with oncology.  Depression: Chronic.  She is not currently taking any medications for this.  She is not currently seeing a therapist.  She denies anxiety, SI/HI.  DM2: Her last A1c was 5.6%, 05/2022.  Her sugars range.  She is taking Metformin as prescribed.  She follows with podiatry.  Her last eye exam was 12/2021.  Flu 08/2021.  Pneumovax 12/2019.  Prevnar 11/2018.  COVID Pfizer x 4.  She follows with endocrinology.  GERD: She is not sure what triggers this.  She is not taking any medications for this.  There is no upper GI on file.    HTN: Her BP today is 130/76.  She is taking Hydralazine, HCTZ, Carvedilol and Irbesartan as prescribed.  ECG from 09/2021 reviewed.  HLD with Aortic Atherosclerosis: Her last LDL was 86, triglycerides 76, 05/2022.  She denies myalgias on Pravastatin.  She is taking Aspirin as well.  She tries to consume a low-fat diet.  Migraines: These occur rarely.  She is not sure what triggers this. She takes Goody powders OTC with some relief of symptoms.  She does not follow with neurology.    Review of Systems  Past Medical History:  Diagnosis Date   Breast cancer (HCC) 01/2021   left breast IDC   Breast cancer, right breast (HCC) 12/2007   a. s/p chemo, radiation, and lumpectomy with negative sentinel lymph node biopsy    Complication of anesthesia    woke up during colonoscopy   Depression    Diabetes mellitus without complication (HCC)    GERD (gastroesophageal reflux disease)    Hyperlipidemia    Hypertension    resistant, negative renal Dopplers and negative CT angiogram   Migraines    Obesity     Current Outpatient  Medications  Medication Sig Dispense Refill   amLODipine (NORVASC) 10 MG tablet TAKE 1 TABLET(10 MG) BY MOUTH DAILY 90 tablet 0   aspirin EC 81 MG tablet Take 1 tablet (81 mg total) by mouth daily. Swallow whole. 30 tablet 12   Biotin w/ Vitamins C & E (HAIR SKIN & NAILS GUMMIES PO) Take by mouth.     calcium carbonate (OSCAL) 1500 (600 Ca) MG TABS tablet Take by mouth 2 (two) times daily with a meal.     carvedilol (COREG) 12.5 MG tablet Take 12.5 mg by mouth in the morning and at bedtime.     hydrALAZINE (APRESOLINE) 100 MG tablet TAKE 1 TABLET BY MOUTH 3 TIMES  DAILY 300 tablet 1   irbesartan (AVAPRO) 300 MG tablet TAKE 1 TABLET BY MOUTH DAILY 90 tablet 1   Lancets (ONETOUCH DELICA PLUS LANCET33G) MISC CHECK BLOOD SUGAR TWICE DAILY 200 each 2   letrozole (FEMARA) 2.5 MG tablet TAKE 1 TABLET BY MOUTH DAILY 90 tablet 3   metFORMIN (GLUCOPHAGE) 1000 MG tablet Take 1,000 mg by mouth 2 (two) times daily with a meal.     Multiple Vitamins-Minerals (ONE-A-DAY WOMENS VITACRAVES) CHEW Chew 1 tablet by mouth daily.      ONETOUCH ULTRA test strip USE TO CHECK BLOOD SUGAR TWICE  DAILY AS DIRECTED 200 strip 2   pravastatin (PRAVACHOL)  40 MG tablet TAKE 1 TABLET BY MOUTH DAILY 90 tablet 3   repaglinide (PRANDIN) 0.5 MG tablet Take 0.5 mg by mouth 3 (three) times daily before meals.     No current facility-administered medications for this visit.    No Known Allergies  Family History  Problem Relation Age of Onset   Stroke Mother    Colon cancer Father 64   Heart disease Father    Asthma Father    Lung cancer Sister    Colon cancer Brother 29   Diabetes Brother    Breast cancer Niece        dx. <50   Breast cancer Niece        dx. <50   Breast cancer Niece        dx. >50   Esophageal cancer Neg Hx    Rectal cancer Neg Hx    Stomach cancer Neg Hx     Social History   Socioeconomic History   Marital status: Married    Spouse name: Awilda Ketterling   Number of children: 3   Years of  education: 12+   Highest education level: Some college, no degree  Occupational History   Occupation: Disabled  Tobacco Use   Smoking status: Never   Smokeless tobacco: Never  Vaping Use   Vaping Use: Never used  Substance and Sexual Activity   Alcohol use: No    Alcohol/week: 0.0 standard drinks of alcohol   Drug use: No   Sexual activity: Yes  Other Topics Concern   Not on file  Social History Narrative   Lives at home with husband.   Caffeine use: 1-2 drinks per month         Epworth Sleepiness Scale = 15 (as of 01/01/15)   Social Determinants of Health   Financial Resource Strain: Low Risk  (06/17/2022)   Overall Financial Resource Strain (CARDIA)    Difficulty of Paying Living Expenses: Not hard at all  Food Insecurity: No Food Insecurity (06/17/2022)   Hunger Vital Sign    Worried About Running Out of Food in the Last Year: Never true    Ran Out of Food in the Last Year: Never true  Transportation Needs: No Transportation Needs (06/17/2022)   PRAPARE - Administrator, Civil Service (Medical): No    Lack of Transportation (Non-Medical): No  Physical Activity: Insufficiently Active (06/17/2022)   Exercise Vital Sign    Days of Exercise per Week: 3 days    Minutes of Exercise per Session: 30 min  Stress: No Stress Concern Present (06/17/2022)   [employer] of Occupational Health - Occupational Stress Questionnaire    Feeling of Stress : Not at all  Social Connections: Socially Integrated (06/17/2022)   Social Connection and Isolation Panel [NHANES]    Frequency of Communication with Friends and Family: More than three times a week    Frequency of Social Gatherings with Friends and Family: Twice a week    Attends Religious Services: More than 4 times per year    Active Member of [employer] or Organizations: Yes    Attends Banker Meetings: More than 4 times per year    Marital Status: Married  Catering manager Violence: Not At Risk (07/08/2021)    Humiliation, Afraid, Rape, and Kick questionnaire    Fear of Current or Ex-Partner: No    Emotionally Abused: No    Physically Abused: No    Sexually Abused: No     Constitutional: Patient  reports intermittent headaches.  Denies fever, malaise, fatigue, or abrupt weight changes.  HEENT: Denies eye pain, eye redness, ear pain, ringing in the ears, wax buildup, runny nose, nasal congestion, bloody nose, or sore throat. Respiratory: Denies difficulty breathing, shortness of breath, cough or sputum production.   Cardiovascular: Denies chest pain, chest tightness, palpitations or swelling in the hands or feet.  Gastrointestinal: Denies abdominal pain, bloating, constipation, diarrhea or blood in the stool.  GU: Denies urgency, frequency, pain with urination, burning sensation, blood in urine, odor or discharge. Musculoskeletal: Patient reports intermittent knee pain.  Denies decrease in range of motion, difficulty with gait, muscle pain or joint swelling.  Skin: Denies redness, rashes, lesions or ulcercations.  Neurological: Denies dizziness, difficulty with memory, difficulty with speech or problems with balance and coordination.  Psych: Patient has a history of depression.  Denies anxiety, SI/HI.  No other specific complaints in a complete review of systems (except as listed in HPI above).     Objective:   Physical Exam  BP 130/76 (BP Location: Left Arm, Patient Position: Sitting, Cuff Size: Normal)   Pulse 84   Temp (!) 96.8 F (36 C) (Temporal)   Wt 207 lb (93.9 kg)   SpO2 99%   BMI 34.45 kg/m    Wt Readings from Last 3 Encounters:  05/03/22 205 lb (93 kg)  01/18/22 210 lb (95.3 kg)  12/27/21 211 lb 9.6 oz (96 kg)    General: Appears her stated age, obese, in NAD. Skin: Warm, dry and intact. No ulcerations noted. HEENT: Head: normal shape and size; Eyes: sclera white, no icterus, conjunctiva pink, PERRLA and EOMs intact;  Cardiovascular: Normal rate and rhythm. S1,S2  noted.  No murmur, rubs or gallops noted. No JVD or BLE edema. No carotid bruits noted. Pulmonary/Chest: Normal effort and positive vesicular breath sounds. No respiratory distress. No wheezes, rales or ronchi noted.  Abdomen: Normal bowel sounds.  Musculoskeletal: No difficulty with gait.  Neurological: Alert and oriented. Coordination normal.  Psychiatric: Mood and affect normal. Behavior is normal. Judgment and thought content normal.     BMET    Component Value Date/Time   NA 143 05/03/2022 0932   NA 146 (H) 01/20/2015 1457   NA 143 10/12/2013 1510   K 4.5 05/03/2022 0932   K 3.9 10/12/2013 1510   CL 105 05/03/2022 0932   CL 109 (H) 10/12/2013 1510   CO2 30 05/03/2022 0932   CO2 29 10/12/2013 1510   GLUCOSE 114 (H) 05/03/2022 0932   GLUCOSE 92 10/12/2013 1510   BUN 24 05/03/2022 0932   BUN 17 01/20/2015 1457   BUN 16 10/12/2013 1510   CREATININE 1.16 (H) 05/03/2022 0932   CALCIUM 10.1 05/03/2022 0932   CALCIUM 9.4 10/12/2013 1510   GFRNONAA 57 (L) 10/13/2021 0013   GFRNONAA 56 (L) 10/12/2013 1510   GFRAA >60 01/24/2018 2159   GFRAA >60 10/12/2013 1510    Lipid Panel     Component Value Date/Time   CHOL 170 01/18/2022 0924   CHOL 179 10/17/2014 1527   TRIG 79 01/18/2022 0924   HDL 75 01/18/2022 0924   HDL 59 10/17/2014 1527   CHOLHDL 2.3 01/18/2022 0924   VLDL 15.2 01/09/2020 0957   LDLCALC 79 01/18/2022 0924    CBC    Component Value Date/Time   WBC 4.1 01/18/2022 0924   RBC 4.11 01/18/2022 0924   HGB 12.3 01/18/2022 0924   HGB 13.5 10/12/2013 1510   HGB 12.8 08/13/2009 0946  HCT 36.5 01/18/2022 0924   HCT 40.5 10/12/2013 1510   HCT 36.8 08/13/2009 0946   PLT 266 01/18/2022 0924   PLT 278 10/12/2013 1510   PLT 271 08/13/2009 0946   MCV 88.8 01/18/2022 0924   MCV 87 10/12/2013 1510   MCV 87.0 08/13/2009 0946   MCH 29.9 01/18/2022 0924   MCHC 33.7 01/18/2022 0924   RDW 12.8 01/18/2022 0924   RDW 13.6 10/12/2013 1510   RDW 13.8 08/13/2009 0946    LYMPHSABS 0.8 10/13/2021 0013   LYMPHSABS 1.5 08/13/2009 0946   MONOABS 0.7 10/13/2021 0013   MONOABS 0.2 08/13/2009 0946   EOSABS 0.0 10/13/2021 0013   EOSABS 0.1 08/13/2009 0946   BASOSABS 0.0 10/13/2021 0013   BASOSABS 0.0 08/13/2009 0946    Hgb A1C Lab Results  Component Value Date   HGBA1C 6.0 (H) 05/03/2022            Assessment & Plan:     RTC in 6 months for your annual exam Nicki Reaper, NP

## 2022-06-21 NOTE — Assessment & Plan Note (Signed)
A1c and urine microalbumin checked by Endocrinology Encourage low carb diet and exercise for weight loss Continue metformin Encouraged routine eye exam Encouraged routine foot exam Immunizations UTD

## 2022-06-23 DIAGNOSIS — I10 Essential (primary) hypertension: Secondary | ICD-10-CM | POA: Diagnosis not present

## 2022-06-23 DIAGNOSIS — N1831 Chronic kidney disease, stage 3a: Secondary | ICD-10-CM | POA: Diagnosis not present

## 2022-06-23 DIAGNOSIS — E1122 Type 2 diabetes mellitus with diabetic chronic kidney disease: Secondary | ICD-10-CM | POA: Diagnosis not present

## 2022-07-02 ENCOUNTER — Other Ambulatory Visit: Payer: Self-pay | Admitting: Internal Medicine

## 2022-07-04 NOTE — Telephone Encounter (Signed)
Requested Prescriptions  Pending Prescriptions Disp Refills   irbesartan (AVAPRO) 300 MG tablet [Pharmacy Med Name: Irbesartan 300 MG Oral Tablet] 90 tablet 0    Sig: TAKE 1 TABLET BY MOUTH DAILY     Cardiovascular:  Angiotensin Receptor Blockers Failed - 07/02/2022 10:19 PM      Failed - Cr in normal range and within 180 days    Creat  Date Value Ref Range Status  05/03/2022 1.16 (H) 0.50 - 1.05 mg/dL Final   Creatinine,U  Date Value Ref Range Status  04/03/2014 447.0 mg/dL Final   Creatinine, Urine  Date Value Ref Range Status  01/18/2022 172 20 - 275 mg/dL Final         Passed - K in normal range and within 180 days    Potassium  Date Value Ref Range Status  05/03/2022 4.5 3.5 - 5.3 mmol/L Final  10/12/2013 3.9 3.5 - 5.1 mmol/L Final         Passed - Patient is not pregnant      Passed - Last BP in normal range    BP Readings from Last 1 Encounters:  06/21/22 130/76         Passed - Valid encounter within last 6 months    Recent Outpatient Visits           1 week ago Aortic atherosclerosis [facility])   [place] [facility] [place], Salvadore Oxford, NP   2 months ago Frequent headaches   [place] [facility] [place], Salvadore Oxford, NP   5 months ago Encounter for general adult medical examination with abnormal findings   [place] [facility] [place], Salvadore Oxford, NP   7 months ago Right hip pain   [place] [facility] [place], [place] W, NP   12 months ago Type 2 diabetes mellitus with hyperglycemia, without long-term current use of insulin [facility])   [place] [facility] [place], Salvadore Oxford, NP       Future Appointments             In 6 months [place], Salvadore Oxford, NP [place] [facility]

## 2022-07-05 ENCOUNTER — Other Ambulatory Visit: Payer: Self-pay | Admitting: Internal Medicine

## 2022-07-05 DIAGNOSIS — I10 Essential (primary) hypertension: Secondary | ICD-10-CM

## 2022-07-06 NOTE — Telephone Encounter (Signed)
Requested Prescriptions  Pending Prescriptions Disp Refills   amLODipine (NORVASC) 10 MG tablet [Pharmacy Med Name: AMLODIPINE BESYLATE 10MG  TABLETS] 90 tablet 1    Sig: TAKE 1 TABLET(10 MG) BY MOUTH DAILY     Cardiovascular: Calcium Channel Blockers 2 Passed - 07/05/2022  3:24 PM      Passed - Last BP in normal range    BP Readings from Last 1 Encounters:  06/21/22 130/76         Passed - Last Heart Rate in normal range    Pulse Readings from Last 1 Encounters:  06/21/22 84         Passed - Valid encounter within last 6 months    Recent Outpatient Visits           2 weeks ago Aortic atherosclerosis [facility])   [place] [facility] [place], Salvadore Oxford, NP   2 months ago Frequent headaches   [place] [facility] [place], Salvadore Oxford, NP   5 months ago Encounter for general adult medical examination with abnormal findings   [place] [facility] [place], Salvadore Oxford, NP   7 months ago Right hip pain   [place] [facility] [place], [place] W, NP   1 year ago Type 2 diabetes mellitus with hyperglycemia, without long-term current use of insulin [facility])   [place] [facility] [place], Salvadore Oxford, NP       Future Appointments             In 6 months Baity, Salvadore Oxford, NP  [facility], [facility]

## 2022-07-29 ENCOUNTER — Ambulatory Visit (INDEPENDENT_AMBULATORY_CARE_PROVIDER_SITE_OTHER): Payer: Medicare Other

## 2022-07-29 VITALS — Ht 65.0 in | Wt 207.0 lb

## 2022-07-29 DIAGNOSIS — Z0000 Encounter for general adult medical examination without abnormal findings: Secondary | ICD-10-CM | POA: Diagnosis not present

## 2022-07-29 NOTE — Patient Instructions (Addendum)
Carrie Singh , Thank you for taking time to come for your Medicare Wellness Visit. I appreciate your ongoing commitment to your health goals. Please review the following plan we discussed and let me know if I can assist you in the future.   These are the goals we discussed:  Goals       Pharmacy Goal      Check your blood pressure. Our goal is less than 130/80.  We recommend a blood pressure cuff that goes around your upper arm, as these are generally the most accurate.   To appropriately check your blood pressure, make sure you do the following:  1) Avoid caffeine, exercise, or tobacco products for 30 minutes before checking. 2) Sit with your back supported in a flat-backed chair. Rest your arm on something flat (arm of the chair, table, etc). 3) Sit still with your feet flat on the floor, resting, for at least 5 minutes.  4) Check your blood pressure. Take 1-2 readings.   Write down these readings and bring with you to any provider appointments. Bring your home blood pressure machine with you to a provider's office for accuracy comparison at least once a year. Make sure you take your blood pressure medications before you come to any office visit.  It was a pleasure speaking with you! Our next telephone call is scheduled for 05/26/2021 at 10 am  Estelle Grumbles, PharmD, [facility] Clinical Pharmacist [facility] [place] [phone]         Stay Healthy (pt-stated)      Lose weight.        This is a list of the screening recommended for you and due dates:  Health Maintenance  Topic Date Due   DTaP/Tdap/Td vaccine (2 - Td or Tdap) 12/18/2018   COVID-19 Vaccine (5 - 2023-24 season) 09/17/2021   Flu Shot  08/18/2022   Hemoglobin A1C  11/02/2022   Eye exam for diabetics  12/24/2022   Mammogram  01/01/2023   Yearly kidney health urinalysis for diabetes  01/19/2023   Complete foot exam   01/19/2023   Yearly kidney function blood test for diabetes  05/03/2023    Medicare Annual Wellness Visit  07/29/2023   Colon Cancer Screening  04/16/2024   Pneumonia Vaccine  Completed   DEXA scan (bone density measurement)  Completed   Hepatitis C Screening  Completed   Zoster (Shingles) Vaccine  Completed   HPV Vaccine  Aged Out    Advanced directives: Advance directive discussed with you today. Even though you declined this today, please call our office should you change your mind, and we can give you the proper paperwork for you to fill out.   Conditions/risks identified: None  Next appointment: Follow up in one year for your annual wellness visit    Preventive Care 65 Years and Older, Female Preventive care refers to lifestyle choices and visits with your health care provider that can promote health and wellness. What does preventive care include? A yearly physical exam. This is also called an annual well check. Dental exams once or twice a year. Routine eye exams. Ask your health care provider how often you should have your eyes checked. Personal lifestyle choices, including: Daily care of your teeth and gums. Regular physical activity. Eating a healthy diet. Avoiding tobacco and drug use. Limiting alcohol use. Practicing safe sex. Taking low-dose aspirin every day. Taking vitamin and mineral supplements as recommended by your health care provider. What happens during an annual well  check? The services and screenings done by your health care provider during your annual well check will depend on your age, overall health, lifestyle risk factors, and family history of disease. Counseling  Your health care provider may ask you questions about your: Alcohol use. Tobacco use. Drug use. Emotional well-being. Home and relationship well-being. Sexual activity. Eating habits. History of falls. Memory and ability to understand (cognition). Work and work Astronomer. Reproductive health. Screening  You may have the following tests or  measurements: Height, weight, and BMI. Blood pressure. Lipid and cholesterol levels. These may be checked every 5 years, or more frequently if you are over 72 years old. Skin check. Lung cancer screening. You may have this screening every year starting at age 53 if you have a 30-pack-year history of smoking and currently smoke or have quit within the past 15 years. Fecal occult blood test (FOBT) of the stool. You may have this test every year starting at age 45. Flexible sigmoidoscopy or colonoscopy. You may have a sigmoidoscopy every 5 years or a colonoscopy every 10 years starting at age 20. Hepatitis C blood test. Hepatitis B blood test. Sexually transmitted disease (STD) testing. Diabetes screening. This is done by checking your blood sugar (glucose) after you have not eaten for a while (fasting). You may have this done every 1-3 years. Bone density scan. This is done to screen for osteoporosis. You may have this done starting at age 40. Mammogram. This may be done every 1-2 years. Talk to your health care provider about how often you should have regular mammograms. Talk with your health care provider about your test results, treatment options, and if necessary, the need for more tests. Vaccines  Your health care provider may recommend certain vaccines, such as: Influenza vaccine. This is recommended every year. Tetanus, diphtheria, and acellular pertussis (Tdap, Td) vaccine. You may need a Td booster every 10 years. Zoster vaccine. You may need this after age 34. Pneumococcal 13-valent conjugate (PCV13) vaccine. One dose is recommended after age 39. Pneumococcal polysaccharide (PPSV23) vaccine. One dose is recommended after age 17. Talk to your health care provider about which screenings and vaccines you need and how often you need them. This information is not intended to replace advice given to you by your health care provider. Make sure you discuss any questions you have with your  health care provider. Document Released: 01/30/2015 Document Revised: 09/23/2015 Document Reviewed: 11/04/2014 Elsevier Interactive Patient Education  2017 [employer].  Fall Prevention in the Home Falls can cause injuries. They can happen to people of all ages. There are many things you can do to make your home safe and to help prevent falls. What can I do on the outside of my home? Regularly fix the edges of walkways and driveways and fix any cracks. Remove anything that might make you trip as you walk through a door, such as a raised step or threshold. Trim any bushes or trees on the path to your home. Use bright outdoor lighting. Clear any walking paths of anything that might make someone trip, such as rocks or tools. Regularly check to see if handrails are loose or broken. Make sure that both sides of any steps have handrails. Any raised decks and porches should have guardrails on the edges. Have any leaves, snow, or ice cleared regularly. Use sand or salt on walking paths during winter. Clean up any spills in your garage right away. This includes oil or grease spills. What can I do in  the bathroom? Use night lights. Install grab bars by the toilet and in the tub and shower. Do not use towel bars as grab bars. Use non-skid mats or decals in the tub or shower. If you need to sit down in the shower, use a plastic, non-slip stool. Keep the floor dry. Clean up any water that spills on the floor as soon as it happens. Remove soap buildup in the tub or shower regularly. Attach bath mats securely with double-sided non-slip rug tape. Do not have throw rugs and other things on the floor that can make you trip. What can I do in the bedroom? Use night lights. Make sure that you have a light by your bed that is easy to reach. Do not use any sheets or blankets that are too big for your bed. They should not hang down onto the floor. Have a firm chair that has side arms. You can use this for  support while you get dressed. Do not have throw rugs and other things on the floor that can make you trip. What can I do in the kitchen? Clean up any spills right away. Avoid walking on wet floors. Keep items that you use a lot in easy-to-reach places. If you need to reach something above you, use a strong step stool that has a grab bar. Keep electrical cords out of the way. Do not use floor polish or wax that makes floors slippery. If you must use wax, use non-skid floor wax. Do not have throw rugs and other things on the floor that can make you trip. What can I do with my stairs? Do not leave any items on the stairs. Make sure that there are handrails on both sides of the stairs and use them. Fix handrails that are broken or loose. Make sure that handrails are as long as the stairways. Check any carpeting to make sure that it is firmly attached to the stairs. Fix any carpet that is loose or worn. Avoid having throw rugs at the top or bottom of the stairs. If you do have throw rugs, attach them to the floor with carpet tape. Make sure that you have a light switch at the top of the stairs and the bottom of the stairs. If you do not have them, ask someone to add them for you. What else can I do to help prevent falls? Wear shoes that: Do not have high heels. Have rubber bottoms. Are comfortable and fit you well. Are closed at the toe. Do not wear sandals. If you use a stepladder: Make sure that it is fully opened. Do not climb a closed stepladder. Make sure that both sides of the stepladder are locked into place. Ask someone to hold it for you, if possible. Clearly mark and make sure that you can see: Any grab bars or handrails. First and last steps. Where the edge of each step is. Use tools that help you move around (mobility aids) if they are needed. These include: Canes. Walkers. Scooters. Crutches. Turn on the lights when you go into a dark area. Replace any light bulbs as soon  as they burn out. Set up your furniture so you have a clear path. Avoid moving your furniture around. If any of your floors are uneven, fix them. If there are any pets around you, be aware of where they are. Review your medicines with your doctor. Some medicines can make you feel dizzy. This can increase your chance of falling. Ask your doctor  what other things that you can do to help prevent falls. This information is not intended to replace advice given to you by your health care provider. Make sure you discuss any questions you have with your health care provider. Document Released: 10/30/2008 Document Revised: 06/11/2015 Document Reviewed: 02/07/2014 Elsevier Interactive Patient Education  2017 [employer].

## 2022-07-29 NOTE — Progress Notes (Signed)
Subjective:   Carrie Singh is a 67 y.o. female who presents for Medicare Annual (Subsequent) preventive examination.  Visit Complete: Virtual  I connected with  Zenon Mayo on 07/29/22 by a audio enabled telemedicine application and verified that I am speaking with the correct person using two identifiers.  Patient Location: Home  Provider Location: Home Office  I discussed the limitations of evaluation and management by telemedicine. The patient expressed understanding and agreed to proceed.  Patient Medicare AWV questionnaire was completed by the patient on 07/25/22; I have confirmed that all information answered by patient is correct and no changes since this date.  Review of Systems      Cardiac Risk Factors include: advanced age (>30men, >36 women);diabetes mellitus;hypertension     Objective:    Today's Vitals   07/29/22 0901  Weight: 207 lb (93.9 kg)  Height: 5\' 5"  (1.651 m)   Body mass index is 34.45 kg/m.     07/29/2022    9:14 AM 10/12/2021   10:54 PM 10/06/2021    8:27 AM 03/10/2021    6:56 AM 03/03/2021   11:52 AM 02/10/2021    4:08 PM 08/08/2020    7:22 AM  Advanced Directives  Does Patient Have a Medical Advance Directive? No No No No No No No  Would patient like information on creating a medical advance directive? No - Patient declined  No - Patient declined No - Patient declined No - Patient declined No - Patient declined No - Guardian declined    Current Medications (verified) Outpatient Encounter Medications as of 07/29/2022  Medication Sig   amLODipine (NORVASC) 10 MG tablet TAKE 1 TABLET(10 MG) BY MOUTH DAILY   aspirin EC 81 MG tablet Take 1 tablet (81 mg total) by mouth daily. Swallow whole.   Biotin w/ Vitamins C & E (HAIR SKIN & NAILS GUMMIES PO) Take by mouth.   calcium carbonate (OSCAL) 1500 (600 Ca) MG TABS tablet Take by mouth 2 (two) times daily with a meal.   carvedilol (COREG) 12.5 MG tablet Take 12.5 mg by mouth in the morning and at  bedtime.   hydrALAZINE (APRESOLINE) 100 MG tablet TAKE 1 TABLET BY MOUTH 3 TIMES  DAILY   irbesartan (AVAPRO) 300 MG tablet TAKE 1 TABLET BY MOUTH DAILY   Lancets (ONETOUCH DELICA PLUS LANCET33G) MISC CHECK BLOOD SUGAR TWICE DAILY   letrozole (FEMARA) 2.5 MG tablet TAKE 1 TABLET BY MOUTH DAILY   metFORMIN (GLUCOPHAGE) 1000 MG tablet Take 1,000 mg by mouth 2 (two) times daily with a meal.   Multiple Vitamins-Minerals (ONE-A-DAY WOMENS VITACRAVES) CHEW Chew 1 tablet by mouth daily.    ONETOUCH ULTRA test strip USE TO CHECK BLOOD SUGAR TWICE  DAILY AS DIRECTED   pravastatin (PRAVACHOL) 40 MG tablet TAKE 1 TABLET BY MOUTH DAILY   repaglinide (PRANDIN) 0.5 MG tablet Take 0.5 mg by mouth 3 (three) times daily before meals.   No facility-administered encounter medications on file as of 07/29/2022.    Allergies (verified) Patient has no known allergies.   History: Past Medical History:  Diagnosis Date   Breast cancer (HCC) 01/2021   left breast IDC   Breast cancer, right breast (HCC) 12/2007   a. s/p chemo, radiation, and lumpectomy with negative sentinel lymph node biopsy    Complication of anesthesia    woke up during colonoscopy   Depression    Diabetes mellitus without complication (HCC)    GERD (gastroesophageal reflux disease)    Hyperlipidemia  Hypertension    resistant, negative renal Dopplers and negative CT angiogram   Migraines    Obesity    Past Surgical History:  Procedure Laterality Date   ABDOMINAL HYSTERECTOMY     BREAST LUMPECTOMY     CYST EXCISION     FOOT SURGERY     MASTECTOMY Right    MASTECTOMY W/ SENTINEL NODE BIOPSY Right 03/18/2016   Procedure: RIGHT MASTECTOMY WITH RIGHT AXILLARY SENTINEL LYMPH NODE BIOPSY;  Surgeon: Ovidio Kin, MD;  Location: [facility];  Service: General;  Laterality: Right;   MASTECTOMY W/ SENTINEL NODE BIOPSY Left 03/10/2021   Procedure: LEFT MASTECTOMY WITH LEFT SENTINEL LYMPH NODE BIOPSY;  Surgeon: Harriette Bouillon, MD;  Location: [facility];  Service: General;  Laterality: Left;   ovary tumor     vocal cord nodules     Family History  Problem Relation Age of Onset   Stroke Mother    Colon cancer Father 40   Heart disease Father    Asthma Father    Lung cancer Sister    Colon cancer Brother 54   Diabetes Brother    Breast cancer Niece        dx. <50   Breast cancer Niece        dx. <50   Breast cancer Niece        dx. >50   Esophageal cancer Neg Hx    Rectal cancer Neg Hx    Stomach cancer Neg Hx    Social History   Socioeconomic History   Marital status: Married    Spouse name: Lyrics Guerrier   Number of children: 3   Years of education: 12+   Highest education level: Some college, no degree  Occupational History   Occupation: Disabled  Tobacco Use   Smoking status: Never   Smokeless tobacco: Never  Vaping Use   Vaping status: Never Used  Substance and Sexual Activity   Alcohol use: No    Alcohol/week: 0.0 standard drinks of alcohol   Drug use: No   Sexual activity: Yes  Other Topics Concern   Not on file  Social History Narrative   Lives at home with husband.   Caffeine use: 1-2 drinks per month         Epworth Sleepiness Scale = 15 (as of 01/01/15)   Social Determinants of Health   Financial Resource Strain: Low Risk  (07/29/2022)   Overall Financial Resource Strain (CARDIA)    Difficulty of Paying Living Expenses: Not hard at all  Food Insecurity: No Food Insecurity (07/29/2022)   Hunger Vital Sign    Worried About Running Out of Food in the Last Year: Never true    Ran Out of Food in the Last Year: Never true  Transportation Needs: No Transportation Needs (07/29/2022)   PRAPARE - Administrator, Civil Service (Medical): No    Lack of Transportation (Non-Medical): No  Physical Activity: Insufficiently Active (07/29/2022)   Exercise Vital Sign    Days of Exercise per Week: 2 days    Minutes of Exercise per Session: 20 min  Stress: No Stress Concern Present  (07/29/2022)   [employer] of Occupational Health - Occupational Stress Questionnaire    Feeling of Stress : Not at all  Social Connections: Socially Integrated (07/29/2022)   Social Connection and Isolation Panel [NHANES]    Frequency of Communication with Friends and Family: More than three times a week    Frequency of Social Gatherings with  Friends and Family: More than three times a week    Attends Religious Services: More than 4 times per year    Active Member of Clubs or Organizations: Yes    Attends Engineer, structural: More than 4 times per year    Marital Status: Married    Tobacco Counseling Counseling given: Not Answered   Clinical Intake:  Pre-visit preparation completed: Yes  Pain : No/denies pain     BMI - recorded: 34.45 Nutritional Status: BMI > 30  Obese Nutritional Risks: None Diabetes: Yes CBG done?: Yes CBG resulted in Enter/ Edit results?: Yes (CBG 106 Taken by patient) Did pt. bring in CBG monitor from home?: No  How often do you need to have someone help you when you read instructions, pamphlets, or other written materials from your doctor or pharmacy?: 1 - Never  Interpreter Needed?: No  Information entered by :: Theresa Mulligan LPN   Activities of Daily Living    07/29/2022    9:11 AM 07/25/2022    9:59 AM  In your present state of health, do you have any difficulty performing the following activities:  Hearing? 0 0  Vision? 0 0  Difficulty concentrating or making decisions? 0 0  Walking or climbing stairs? 0 0  Dressing or bathing? 0 0  Doing errands, shopping? 0 0  Preparing Food and eating ? N N  Using the Toilet? N N  In the past six months, have you accidently leaked urine? Y Y  Comment Followed by PCP   Do you have problems with loss of bowel control? N N  Managing your Medications? N N  Managing your Finances? N N  Housekeeping or managing your Housekeeping? N Y    Patient Care Team: Lorre Munroe, NP as PCP -  General (Internal Medicine) Antonieta Iba, MD as Consulting Physician (Cardiology) Serena Croissant, MD as Consulting Physician (Hematology and Oncology) Axel Filler, Larna Daughters, NP as Nurse Practitioner (Hematology and Oncology) Harriette Bouillon, MD as Consulting Physician (General Surgery)  Indicate any recent Medical Services you may have received from other than [facility] providers in the past year (date may be approximate).     Assessment:   This is a routine wellness examination for Dawna.  Hearing/Vision screen Hearing Screening - Comments:: Denies hearing difficulties   Vision Screening - Comments:: Wears rx glasses - up to date with routine eye exams with  [facility]  Dietary issues and exercise activities discussed:     Goals Addressed               This Visit's Progress     Stay Healthy (pt-stated)        Lose weight.       Depression Screen    07/29/2022    9:09 AM 06/21/2022   10:17 AM 05/03/2022    9:35 AM 01/18/2022    9:16 AM 11/25/2021    3:22 PM 07/06/2021    9:15 AM 06/17/2021   10:34 AM  PHQ 2/9 Scores  PHQ - 2 Score 0 0 1 0 4 0 2  PHQ- 9 Score    1 6 0 4    Fall Risk    07/29/2022    9:12 AM 07/25/2022    9:59 AM 06/21/2022   10:17 AM 05/03/2022    9:35 AM 01/18/2022    9:17 AM  Fall Risk   Falls in the past year? 0 0 0 0 0  Number falls in past yr: 0  0     Injury with Fall? 0 0 0 0 0  Risk for fall due to : No Fall Risks  No Fall Risks    Follow up Falls prevention discussed        MEDICARE RISK AT HOME:  Medicare Risk at Home - 07/29/22 0920     Any stairs in or around the home? Yes    If so, are there any without handrails? No    Home free of loose throw rugs in walkways, pet beds, electrical cords, etc? Yes    Adequate lighting in your home to reduce risk of falls? Yes    Life alert? No    Use of a cane, walker or w/c? No    Grab bars in the bathroom? No    Shower chair or bench in shower? No    Elevated toilet seat or a handicapped  toilet? Yes             TIMED UP AND GO:  Was the test performed?  No    Cognitive Function:        07/29/2022    9:14 AM 07/08/2021   12:45 PM  6CIT Screen  What Year? 0 points 0 points  What month? 0 points 0 points  What time? 0 points 0 points  Count back from 20 0 points 0 points  Months in reverse 0 points 0 points  Repeat phrase 0 points 0 points  Total Score 0 points 0 points    Immunizations Immunization History  Administered Date(s) Administered   Fluad Quad(high Dose 65+) 09/20/2018, 10/30/2019, 11/16/2020, 09/09/2021   Influenza-Unspecified 03/06/2017   PFIZER(Purple Top)SARS-COV-2 Vaccination 03/22/2019, 04/12/2019, 10/30/2019   Pfizer Covid-19 Vaccine Bivalent Booster 60yrs & up 01/19/2021   Pneumococcal Conjugate-13 12/06/2018   Pneumococcal Polysaccharide-23 01/09/2020   Tdap 12/17/2008   Zoster Recombinant(Shingrix) 06/10/2021, 09/09/2021    TDAP status: Due, Education has been provided regarding the importance of this vaccine. Advised may receive this vaccine at local pharmacy or Health Dept. Aware to provide a copy of the vaccination record if obtained from local pharmacy or Health Dept. Verbalized acceptance and understanding.  Flu Vaccine status: Declined, Education has been provided regarding the importance of this vaccine but patient still declined. Advised may receive this vaccine at local pharmacy or Health Dept. Aware to provide a copy of the vaccination record if obtained from local pharmacy or Health Dept. Verbalized acceptance and understanding.  Pneumococcal vaccine status: Up to date  Covid-19 vaccine status: Completed vaccines  Qualifies for Shingles Vaccine? Yes   Zostavax completed Yes   Shingrix Completed?: Yes  Screening Tests Health Maintenance  Topic Date Due   DTaP/Tdap/Td (2 - Td or Tdap) 12/18/2018   COVID-19 Vaccine (5 - 2023-24 season) 09/17/2021   INFLUENZA VACCINE  08/18/2022   HEMOGLOBIN A1C  11/02/2022    OPHTHALMOLOGY EXAM  12/24/2022   MAMMOGRAM  01/01/2023   Diabetic kidney evaluation - Urine ACR  01/19/2023   FOOT EXAM  01/19/2023   Diabetic kidney evaluation - eGFR measurement  05/03/2023   Medicare Annual Wellness (AWV)  07/29/2023   Colonoscopy  04/16/2024   Pneumonia Vaccine 45+ Years old  Completed   DEXA SCAN  Completed   Hepatitis C Screening  Completed   Zoster Vaccines- Shingrix  Completed   HPV VACCINES  Aged Out    Health Maintenance  Health Maintenance Due  Topic Date Due   DTaP/Tdap/Td (2 - Td or Tdap) 12/18/2018   COVID-19 Vaccine (  5 - 2023-24 season) 09/17/2021    Colorectal cancer screening: Type of screening: Colonoscopy. Completed 04/16/21. Repeat every 3 years  Mammogram status: Completed 01/18/22. Repeat every year  Bone Density status: Completed 12/31/20. Results reflect: Bone density results: NORMAL. Repeat every   years.  Lung Cancer Screening: (Low Dose CT Chest recommended if Age 62-80 years, 20 pack-year currently smoking OR have quit w/in 15years.) does not qualify.     Additional Screening:  Hepatitis C Screening: does qualify; Completed 09/08/15  Vision Screening: Recommended annual ophthalmology exams for early detection of glaucoma and other disorders of the eye. Is the patient up to date with their annual eye exam?  Yes  Who is the provider or what is the name of the office in which the patient attends annual eye exams? [facility] If pt is not established with a provider, would they like to be referred to a provider to establish care? No .   Dental Screening: Recommended annual dental exams for proper oral hygiene  Diabetic Foot Exam: Diabetic Foot Exam: Overdue, Pt has been advised about the importance in completing this exam. Pt is scheduled for diabetic foot exam on Followed by PCP.  Community Resource Referral / Chronic Care Management:  CRR required this visit?  No   CCM required this visit?  No     Plan:     I have  personally reviewed and noted the following in the patient's chart:   Medical and social history Use of alcohol, tobacco or illicit drugs  Current medications and supplements including opioid prescriptions. Patient is not currently taking opioid prescriptions. Functional ability and status Nutritional status Physical activity Advanced directives List of other physicians Hospitalizations, surgeries, and ER visits in previous 12 months Vitals Screenings to include cognitive, depression, and falls Referrals and appointments  In addition, I have reviewed and discussed with patient certain preventive protocols, quality metrics, and best practice recommendations. A written personalized care plan for preventive services as well as general preventive health recommendations were provided to patient.     Tillie Rung, LPN   04/25/8117   After Visit Summary: (MyChart) Due to this being a telephonic visit, the after visit summary with patients personalized plan was offered to patient via MyChart   Nurse Notes: None

## 2022-08-10 ENCOUNTER — Encounter: Payer: Self-pay | Admitting: Internal Medicine

## 2022-08-10 DIAGNOSIS — I10 Essential (primary) hypertension: Secondary | ICD-10-CM

## 2022-08-15 MED ORDER — AMLODIPINE BESYLATE 10 MG PO TABS: 10.0000 mg | ORAL_TABLET | Freq: Every day | ORAL | 1 refills | Status: DC

## 2022-09-13 ENCOUNTER — Encounter (HOSPITAL_BASED_OUTPATIENT_CLINIC_OR_DEPARTMENT_OTHER): Payer: Self-pay | Admitting: Emergency Medicine

## 2022-09-13 ENCOUNTER — Other Ambulatory Visit: Payer: Self-pay

## 2022-09-13 ENCOUNTER — Emergency Department (HOSPITAL_BASED_OUTPATIENT_CLINIC_OR_DEPARTMENT_OTHER): Payer: Medicare Other

## 2022-09-13 ENCOUNTER — Emergency Department (HOSPITAL_BASED_OUTPATIENT_CLINIC_OR_DEPARTMENT_OTHER)
Admission: EM | Admit: 2022-09-13 | Discharge: 2022-09-13 | Disposition: A | Discharge status: Home / Self Care | Payer: Medicare Other | Attending: Emergency Medicine | Admitting: Emergency Medicine

## 2022-09-13 DIAGNOSIS — E119 Type 2 diabetes mellitus without complications: Secondary | ICD-10-CM | POA: Diagnosis not present

## 2022-09-13 DIAGNOSIS — I672 Cerebral atherosclerosis: Secondary | ICD-10-CM | POA: Diagnosis not present

## 2022-09-13 DIAGNOSIS — Z853 Personal history of malignant neoplasm of breast: Secondary | ICD-10-CM | POA: Diagnosis not present

## 2022-09-13 DIAGNOSIS — I10 Essential (primary) hypertension: Secondary | ICD-10-CM | POA: Insufficient documentation

## 2022-09-13 DIAGNOSIS — G4486 Cervicogenic headache: Secondary | ICD-10-CM | POA: Diagnosis not present

## 2022-09-13 DIAGNOSIS — I6782 Cerebral ischemia: Secondary | ICD-10-CM | POA: Diagnosis not present

## 2022-09-13 DIAGNOSIS — R519 Headache, unspecified: Secondary | ICD-10-CM | POA: Diagnosis present

## 2022-09-13 DIAGNOSIS — I6521 Occlusion and stenosis of right carotid artery: Secondary | ICD-10-CM | POA: Diagnosis not present

## 2022-09-13 LAB — CBC
HCT: 35.7 % — ABNORMAL LOW (ref 36.0–46.0)
Hemoglobin: 12.2 g/dL (ref 12.0–15.0)
MCH: 29.5 pg (ref 26.0–34.0)
MCHC: 34.2 g/dL (ref 30.0–36.0)
MCV: 86.4 fL (ref 80.0–100.0)
Platelets: 221 10*3/uL (ref 150–400)
RBC: 4.13 MIL/uL (ref 3.87–5.11)
RDW: 12.8 % (ref 11.5–15.5)
WBC: 4.5 10*3/uL (ref 4.0–10.5)
nRBC: 0 % (ref 0.0–0.2)

## 2022-09-13 LAB — BASIC METABOLIC PANEL
Anion gap: 11 (ref 5–15)
BUN: 20 mg/dL (ref 8–23)
CO2: 25 mmol/L (ref 22–32)
Calcium: 9.4 mg/dL (ref 8.9–10.3)
Chloride: 104 mmol/L (ref 98–111)
Creatinine, Ser: 1.04 mg/dL — ABNORMAL HIGH (ref 0.44–1.00)
GFR, Estimated: 58 mL/min — ABNORMAL LOW (ref >60)
Glucose, Bld: 84 mg/dL (ref 70–99)
Potassium: 3.7 mmol/L (ref 3.5–5.1)
Sodium: 140 mmol/L (ref 135–145)

## 2022-09-13 MED ORDER — NAPROXEN 250 MG PO TABS: 500.0000 mg | ORAL_TABLET | Freq: Two times a day (BID) | ORAL | 0 refills | Status: DC

## 2022-09-13 MED ORDER — METHOCARBAMOL 500 MG PO TABS: 500.0000 mg | ORAL_TABLET | Freq: Three times a day (TID) | ORAL | 0 refills | Status: DC | PRN

## 2022-09-13 MED ORDER — LIDOCAINE 5 % EX PTCH: 1.0000 | MEDICATED_PATCH | CUTANEOUS | 0 refills | Status: DC

## 2022-09-13 MED ORDER — IOHEXOL 350 MG/ML SOLN
75.0000 mL | Freq: Once | INTRAVENOUS | Status: AC | PRN
  Administered 2022-09-13: 75 mL via INTRAVENOUS

## 2022-09-13 NOTE — Discharge Instructions (Addendum)
You were evaluated in the Emergency Department and after careful evaluation, we did not find any emergent condition requiring admission or further testing in the hospital.  Your exam/testing today is overall reassuring.  Symptoms likely related to a strained muscle.  Use the Naprosyn twice daily for pain.  Can use the Robaxin muscle relaxer for more significant pain.  Also recommend the numbing patches provided.  Follow-up with your regular doctor.  Please return to the Emergency Department if you experience any worsening of your condition.   Thank you for allowing [place] to be a part of your care.

## 2022-09-13 NOTE — ED Provider Notes (Signed)
[facility]-EMERGENCY DEPT [facility] Emergency Department Provider Note MRN:  [id number]  Arrival date & time: 09/13/22     Chief Complaint   Headache and Throat Pain   History of Present Illness   Carrie Singh is a 67 y.o. year-old female with a history of breast cancer, hypertension presenting to the ED with chief complaint of headache.  Pain to the left side of the head and left neck, present for the past few days.  Also with hoarse voice/sore throat/ear discomfort.  Has a history of migraines and this feels a bit similar but also bit different.  Worse with palpation to the neck.  Review of Systems  A thorough review of systems was obtained and all systems are negative except as noted in the HPI and PMH.   Patient's Health History    Past Medical History:  Diagnosis Date   Breast cancer (HCC) 01/2021   left breast IDC   Breast cancer, right breast (HCC) 12/2007   a. s/p chemo, radiation, and lumpectomy with negative sentinel lymph node biopsy    Complication of anesthesia    woke up during colonoscopy   Depression    Diabetes mellitus without complication (HCC)    GERD (gastroesophageal reflux disease)    Hyperlipidemia    Hypertension    resistant, negative renal Dopplers and negative CT angiogram   Migraines    Obesity     Past Surgical History:  Procedure Laterality Date   ABDOMINAL HYSTERECTOMY     BREAST LUMPECTOMY     CYST EXCISION     FOOT SURGERY     MASTECTOMY Right    MASTECTOMY W/ SENTINEL NODE BIOPSY Right 03/18/2016   Procedure: RIGHT MASTECTOMY WITH RIGHT AXILLARY SENTINEL LYMPH NODE BIOPSY;  Surgeon: Ovidio Kin, MD;  Location: [facility];  Service: General;  Laterality: Right;   MASTECTOMY W/ SENTINEL NODE BIOPSY Left 03/10/2021   Procedure: LEFT MASTECTOMY WITH LEFT SENTINEL LYMPH NODE BIOPSY;  Surgeon: Harriette Bouillon, MD;  Location: [facility];  Service: General;  Laterality: Left;   ovary tumor     vocal cord nodules       Family History  Problem Relation Age of Onset   Stroke Mother    Colon cancer Father 65   Heart disease Father    Asthma Father    Lung cancer Sister    Colon cancer Brother 37   Diabetes Brother    Breast cancer Niece        dx. <50   Breast cancer Niece        dx. <50   Breast cancer Niece        dx. >50   Esophageal cancer Neg Hx    Rectal cancer Neg Hx    Stomach cancer Neg Hx     Social History   Socioeconomic History   Marital status: Married    Spouse name: Argusta Mikkelsen   Number of children: 3   Years of education: 12+   Highest education level: Some college, no degree  Occupational History   Occupation: Disabled  Tobacco Use   Smoking status: Never   Smokeless tobacco: Never  Vaping Use   Vaping status: Never Used  Substance and Sexual Activity   Alcohol use: No    Alcohol/week: 0.0 standard drinks of alcohol   Drug use: No   Sexual activity: Yes  Other Topics Concern   Not on file  Social History Narrative   Lives at home with husband.  Caffeine use: 1-2 drinks per month         Epworth Sleepiness Scale = 15 (as of 01/01/15)   Social Determinants of Health   Financial Resource Strain: Low Risk  (07/29/2022)   Overall Financial Resource Strain (CARDIA)    Difficulty of Paying Living Expenses: Not hard at all  Food Insecurity: No Food Insecurity (07/29/2022)   Hunger Vital Sign    Worried About Running Out of Food in the Last Year: Never true    Ran Out of Food in the Last Year: Never true  Transportation Needs: No Transportation Needs (07/29/2022)   PRAPARE - Administrator, Civil Service (Medical): No    Lack of Transportation (Non-Medical): No  Physical Activity: Insufficiently Active (07/29/2022)   Exercise Vital Sign    Days of Exercise per Week: 2 days    Minutes of Exercise per Session: 20 min  Stress: No Stress Concern Present (07/29/2022)   [employer] of Occupational Health - Occupational Stress Questionnaire     Feeling of Stress : Not at all  Social Connections: Socially Integrated (07/29/2022)   Social Connection and Isolation Panel [NHANES]    Frequency of Communication with Friends and Family: More than three times a week    Frequency of Social Gatherings with Friends and Family: More than three times a week    Attends Religious Services: More than 4 times per year    Active Member of [employer] or Organizations: Yes    Attends Banker Meetings: More than 4 times per year    Marital Status: Married  Catering manager Violence: Not At Risk (07/29/2022)   Humiliation, Afraid, Rape, and Kick questionnaire    Fear of Current or Ex-Partner: No    Emotionally Abused: No    Physically Abused: No    Sexually Abused: No     Physical Exam   Vitals:   09/13/22 0415 09/13/22 0417  BP: (!) 157/91   Pulse: 96   Resp: 18   Temp:  98.8 F (37.1 C)  SpO2: 99%     CONSTITUTIONAL: Well-appearing, NAD NEURO/PSYCH:  Alert and oriented x 3, normal and symmetric strength and sensation, normal coordination, normal speech; motor testing of the left leg limited due to chronic knee issues EYES:  eyes equal and reactive ENT/NECK:  no LAD, no JVD CARDIO: Regular rate, well-perfused, normal S1 and S2 PULM:  CTAB no wheezing or rhonchi GI/GU:  non-distended, non-tender MSK/SPINE:  No gross deformities, no edema SKIN:  no rash, atraumatic   *Additional and/or pertinent findings included in MDM below  Diagnostic and Interventional Summary    EKG Interpretation Date/Time:    Ventricular Rate:    PR Interval:    QRS Duration:    QT Interval:    QTC Calculation:   R Axis:      Text Interpretation:         Labs Reviewed  BASIC METABOLIC PANEL - Abnormal; Notable for the following components:      Result Value   Creatinine, Ser 1.04 (*)    GFR, Estimated 58 (*)    All other components within normal limits  CBC - Abnormal; Notable for the following components:   HCT 35.7 (*)    All other  components within normal limits    CT ANGIO HEAD NECK W WO CM  Final Result      Medications  iohexol (OMNIPAQUE) 350 MG/ML injection 75 mL (75 mLs Intravenous Contrast Given 09/13/22 0611)  Procedures  /  Critical Care Procedures  ED Course and Medical Decision Making  Initial Impression and Ddx Differential diagnosis includes cervicogenic headache, carotid artery dissection, metastatic lesion.  Favoring cervicogenic headache given the tenderness to palpation to the left lateral musculature of the neck.  Past medical/surgical history that increases complexity of ED encounter: Hypertension  Interpretation of Diagnostics I personally reviewed the laboratory assessment and my interpretation is as follows: No significant blood count or electrolyte disturbance  CTA imaging is unremarkable.  Patient Reassessment and Ultimate Disposition/Management     Patient without evidence of emergent process, appropriate for discharge.  Patient management required discussion with the following services or consulting groups:  None  Complexity of Problems Addressed Acute illness or injury that poses threat of life of bodily function  Additional Data Reviewed and Analyzed Further history obtained from: Further history from spouse/family member  Additional Factors Impacting ED Encounter Risk Prescriptions  Elmer Sow. Pilar Plate, MD [facility] Emergency Medicine [facility] [email or web address]  Final Clinical Impressions(s) / ED Diagnoses     ICD-10-CM   1. Cervicogenic headache  G44.86       ED Discharge Orders          Ordered    naproxen (NAPROSYN) 250 MG tablet  2 times daily with meals        09/13/22 0702    methocarbamol (ROBAXIN) 500 MG tablet  Every 8 hours PRN        09/13/22 0702    lidocaine (LIDODERM) 5 %  Every 24 hours        09/13/22 0702             Discharge Instructions Discussed with and Provided to Patient:     Discharge Instructions       You were evaluated in the Emergency Department and after careful evaluation, we did not find any emergent condition requiring admission or further testing in the hospital.  Your exam/testing today is overall reassuring.  Symptoms likely related to a strained muscle.  Use the Naprosyn twice daily for pain.  Can use the Robaxin muscle relaxer for more significant pain.  Also recommend the numbing patches provided.  Follow-up with your regular doctor.  Please return to the Emergency Department if you experience any worsening of your condition.   Thank you for allowing [place] to be a part of your care.       Sabas Sous, MD 09/13/22 [phone]

## 2022-09-13 NOTE — ED Triage Notes (Signed)
Pt in with L sided HA, L ear pain, L throat and neck pain intermittently ongoing x a few mo's. Pt states tonight, she cannot sleep due to pain. Denies any dizziness or cp

## 2022-09-20 DIAGNOSIS — N182 Chronic kidney disease, stage 2 (mild): Secondary | ICD-10-CM | POA: Diagnosis not present

## 2022-09-25 ENCOUNTER — Encounter: Payer: Self-pay | Admitting: Internal Medicine

## 2022-09-27 ENCOUNTER — Emergency Department (HOSPITAL_BASED_OUTPATIENT_CLINIC_OR_DEPARTMENT_OTHER)
Admission: EM | Admit: 2022-09-27 | Discharge: 2022-09-27 | Disposition: A | Discharge status: Home / Self Care | Payer: Medicare Other | Attending: Emergency Medicine | Admitting: Emergency Medicine

## 2022-09-27 ENCOUNTER — Other Ambulatory Visit: Payer: Self-pay

## 2022-09-27 ENCOUNTER — Encounter (HOSPITAL_BASED_OUTPATIENT_CLINIC_OR_DEPARTMENT_OTHER): Payer: Self-pay | Admitting: Emergency Medicine

## 2022-09-27 DIAGNOSIS — H6123 Impacted cerumen, bilateral: Secondary | ICD-10-CM | POA: Insufficient documentation

## 2022-09-27 DIAGNOSIS — R509 Fever, unspecified: Secondary | ICD-10-CM | POA: Insufficient documentation

## 2022-09-27 DIAGNOSIS — N1831 Chronic kidney disease, stage 3a: Secondary | ICD-10-CM | POA: Diagnosis not present

## 2022-09-27 DIAGNOSIS — I10 Essential (primary) hypertension: Secondary | ICD-10-CM | POA: Diagnosis not present

## 2022-09-27 DIAGNOSIS — R0981 Nasal congestion: Secondary | ICD-10-CM | POA: Diagnosis not present

## 2022-09-27 DIAGNOSIS — Z20822 Contact with and (suspected) exposure to covid-19: Secondary | ICD-10-CM | POA: Diagnosis not present

## 2022-09-27 DIAGNOSIS — R590 Localized enlarged lymph nodes: Secondary | ICD-10-CM | POA: Insufficient documentation

## 2022-09-27 DIAGNOSIS — I129 Hypertensive chronic kidney disease with stage 1 through stage 4 chronic kidney disease, or unspecified chronic kidney disease: Secondary | ICD-10-CM | POA: Diagnosis not present

## 2022-09-27 DIAGNOSIS — Z79899 Other long term (current) drug therapy: Secondary | ICD-10-CM | POA: Insufficient documentation

## 2022-09-27 DIAGNOSIS — N281 Cyst of kidney, acquired: Secondary | ICD-10-CM | POA: Diagnosis not present

## 2022-09-27 DIAGNOSIS — Z7982 Long term (current) use of aspirin: Secondary | ICD-10-CM | POA: Insufficient documentation

## 2022-09-27 DIAGNOSIS — E1122 Type 2 diabetes mellitus with diabetic chronic kidney disease: Secondary | ICD-10-CM | POA: Diagnosis not present

## 2022-09-27 DIAGNOSIS — Z853 Personal history of malignant neoplasm of breast: Secondary | ICD-10-CM | POA: Insufficient documentation

## 2022-09-27 DIAGNOSIS — D631 Anemia in chronic kidney disease: Secondary | ICD-10-CM | POA: Diagnosis not present

## 2022-09-27 LAB — RESP PANEL BY RT-PCR (RSV, FLU A&B, COVID)  RVPGX2
Influenza A by PCR: NEGATIVE
Influenza B by PCR: NEGATIVE
Resp Syncytial Virus by PCR: NEGATIVE
SARS Coronavirus 2 by RT PCR: NEGATIVE

## 2022-09-27 MED ORDER — FLUTICASONE PROPIONATE 50 MCG/ACT NA SUSP: 2.0000 | Freq: Every day | NASAL | 0 refills | Status: AC

## 2022-09-27 NOTE — ED Notes (Signed)
Pt.  Reports stuffy nose and congestion for several weeks then getting better and getting worse again.  Pt. In no resp. Distress and noted with clear lung snds. Bilat.

## 2022-09-27 NOTE — ED Triage Notes (Signed)
Pt c/o congestion and fatigue since Saturday

## 2022-09-27 NOTE — Discharge Instructions (Signed)
Please hold off on using the Major 12h nasal spray and switch to saline nasal spray with Flonase as prescribed. Please follow closely with your PCP.

## 2022-09-27 NOTE — ED Provider Notes (Signed)
[place] EMERGENCY DE[facility]MENT AT [facility] Provider Note   CSN: [id number] Arrival date & time: 09/27/22  2122     History  Chief Complaint  Patient presents with   Nasal Congestion    Carrie Singh is a 67 y.o. female with a past medical history of breast cancer on Letrozole, HTN, and migraines presents emergency department for evaluation of congestion and fatigue that began on Sunday patient states that she woke up Sunday morning with a sore throat, congestion, productive cough, rhinorrhea, headaches, subjective fever, and left ear pain. She has tried "Major 12 hour" nasal spray and emergenC.  She denies myalgias, arthralgias, and sick contacts.      Home Medications Prior to Admission medications   Medication Sig Start Date End Date Taking? Authorizing Provider  fluticasone (FLONASE) 50 MCG/ACT nasal spray Place 2 sprays into both nostrils daily for 7 days. 09/27/22 10/04/22 Yes Long, Arlyss Repress, MD  amLODipine (NORVASC) 10 MG tablet Take 1 tablet (10 mg total) by mouth daily. 08/15/22   Lorre Munroe, NP  aspirin EC 81 MG tablet Take 1 tablet (81 mg total) by mouth daily. Swallow whole. 01/18/22   Lorre Munroe, NP  Biotin w/ Vitamins C & E (HAIR SKIN & NAILS GUMMIES PO) Take by mouth.    [provider]  calcium carbonate (OSCAL) 1500 (600 Ca) MG TABS tablet Take by mouth 2 (two) times daily with a meal.    [provider]  carvedilol (COREG) 12.5 MG tablet Take 12.5 mg by mouth in the morning and at bedtime. 04/30/19   [provider]  hydrALAZINE (APRESOLINE) 100 MG tablet TAKE 1 TABLET BY MOUTH 3 TIMES  DAILY 05/27/22   Lorre Munroe, NP  irbesartan (AVAPRO) 300 MG tablet TAKE 1 TABLET BY MOUTH DAILY 07/04/22   Lorre Munroe, NP  Lancets (ONETOUCH DELICA PLUS [place]) MISC CHECK BLOOD SUGAR TWICE DAILY 05/03/22   Lorre Munroe, NP  letrozole [facility]) 2.5 MG tablet TAKE 1 TABLET BY MOUTH DAILY 01/24/22   Serena Croissant, MD  lidocaine  (LIDODERM) 5 % Place 1 patch onto the skin daily. Remove & Discard patch within 12 hours or as directed by MD 09/13/22   Sabas Sous, MD  metFORMIN (GLUCOPHAGE) 1000 MG tablet Take 1,000 mg by mouth 2 (two) times daily with a meal.    [provider]  methocarbamol (ROBAXIN) 500 MG tablet Take 1 tablet (500 mg total) by mouth every 8 (eight) hours as needed for muscle spasms. 09/13/22   Sabas Sous, MD  Multiple Vitamins-Minerals (ONE-A-DAY WOMENS VITACRAVES) CHEW Chew 1 tablet by mouth daily.     [provider]  naproxen (NAPROSYN) 250 MG tablet Take 2 tablets (500 mg total) by mouth 2 (two) times daily with a meal. 09/13/22   Sabas Sous, MD  [facility] ULTRA test strip USE TO CHECK BLOOD SUGAR TWICE  DAILY AS DIRECTED 05/02/22   Lorre Munroe, NP  pravastatin (PRAVACHOL) 40 MG tablet TAKE 1 TABLET BY MOUTH DAILY 02/15/22   Lorre Munroe, NP  repaglinide (PRANDIN) 0.5 MG tablet Take 0.5 mg by mouth 3 (three) times daily before meals.    [provider]      Allergies    Patient has no known allergies.    Review of Systems   Review of Systems  Constitutional:  Positive for chills and fever.  HENT:  Positive for congestion, ear pain (Left), rhinorrhea and sore throat.  Negative for sinus pain.   Respiratory:  Positive for cough. Negative for shortness of breath.   Cardiovascular:  Negative for chest pain.  Neurological:  Positive for headaches.    Physical Exam Updated Vital Signs BP (!) 147/92 (BP Location: Right Arm)   Pulse 86   Temp 97.7 F (36.5 C)   Resp 18   Ht 5\' 5"  (1.651 m)   Wt 95.3 kg   SpO2 100%   BMI 34.95 kg/m  Physical Exam Constitutional:      General: She is not in acute distress.    Appearance: She is obese. She is not ill-appearing, toxic-appearing or diaphoretic.  HENT:     Head: Normocephalic.     Right Ear: There is impacted cerumen.     Left Ear: There is impacted cerumen (L>R).     Mouth/Throat:     Mouth: Mucous  membranes are moist.     Pharynx: Posterior oropharyngeal erythema present. No oropharyngeal exudate.  Eyes:     Conjunctiva/sclera: Conjunctivae normal.  Cardiovascular:     Rate and Rhythm: Normal rate and regular rhythm.  Pulmonary:     Effort: Pulmonary effort is normal.     Breath sounds: Normal breath sounds.  Lymphadenopathy:     Cervical: Cervical adenopathy (Level 1B, bilateral) present.  Skin:    General: Skin is warm and dry.     Coloration: Skin is not pale.  Neurological:     Mental Status: She is alert and oriented to person, place, and time.  Psychiatric:        Mood and Affect: Mood normal.     ED Results / Procedures / Treatments   Labs (all labs ordered are listed, but only abnormal results are displayed) Labs Reviewed  RESP PANEL BY RT-PCR (RSV, FLU A&B, COVID)  [id number]    EKG None  Radiology No results found.  Procedures Procedures    Medications Ordered in ED Medications - No data to display  ED Course/ Medical Decision Making/ A&P                                 Medical Decision Making Patient is a 67 year old female with past medical history significant for breast cancer who presents with symptoms correlating with an upper respiratory infection. She is afebrile, with oxygen saturation of 100% on room air, no tachycardia, and RR of 18.  COVID, influenza, and RSV testing all negative.   She is not in respiratory distress or febrile, she will be discharged home with conservative treatment of Flonase.  Recommended discontinuation of Afrin nasal spray, likely contributing to rebound congestion.   Amount and/or Complexity of Data Reviewed Independent Historian:     Details: Patient Labs:     Details: RSV negative Influenza A & Influenza B negative COVID-19 negative           Final Clinical Impression(s) / ED Diagnoses Final diagnoses:  Nasal congestion    Rx / DC Orders ED Discharge Orders          Ordered    fluticasone  (FLONASE) 50 MCG/ACT nasal spray  Daily        09/27/22 2225              Faith Rogue, DO 09/27/22 2246    Maia Plan, MD 09/29/22 1217

## 2022-09-29 ENCOUNTER — Ambulatory Visit: Payer: Medicare Other | Admitting: Internal Medicine

## 2022-09-29 ENCOUNTER — Ambulatory Visit (INDEPENDENT_AMBULATORY_CARE_PROVIDER_SITE_OTHER): Payer: Medicare Other | Admitting: Internal Medicine

## 2022-09-29 ENCOUNTER — Encounter: Payer: Self-pay | Admitting: Internal Medicine

## 2022-09-29 VITALS — BP 128/82 | HR 75 | Temp 96.7°F | Wt 210.0 lb

## 2022-09-29 DIAGNOSIS — Z23 Encounter for immunization: Secondary | ICD-10-CM | POA: Diagnosis not present

## 2022-09-29 DIAGNOSIS — J069 Acute upper respiratory infection, unspecified: Secondary | ICD-10-CM | POA: Diagnosis not present

## 2022-09-29 DIAGNOSIS — C50912 Malignant neoplasm of unspecified site of left female breast: Secondary | ICD-10-CM | POA: Diagnosis not present

## 2022-09-29 DIAGNOSIS — H6122 Impacted cerumen, left ear: Secondary | ICD-10-CM

## 2022-09-29 DIAGNOSIS — C50911 Malignant neoplasm of unspecified site of right female breast: Secondary | ICD-10-CM | POA: Diagnosis not present

## 2022-09-29 MED ORDER — PREDNISONE 20 MG PO TABS
20.0000 mg | ORAL_TABLET | Freq: Every day | ORAL | 0 refills | Status: DC
Start: 2022-09-29 — End: 2022-12-29

## 2022-09-29 NOTE — Progress Notes (Signed)
Subjective:    Patient ID: Carrie Singh, female    DOB: 1955-02-23, 67 y.o.   MRN: [id number]  HPI  Patient presents to the clinic today for urgent care follow-up.  She presented to urgent care 9/10 with complaint of upper respiratory symptoms.  COVID and flu test were negative.  They recommend she discontinue the nasal sprays she has been using and started her on Flonase.  Since that time, she reports nasal congestion, ear pain, sore throat and cough. She is blowing clear mucous out of her nose. She denies ear drainage or loss of hearing. She denies difficulty swallowing. The cough is mostly nonproductive. She denies fever, chills or body aches currently. She has tried naproxen and flonase with some improvements in symptoms.  She has not had sick contacts that she is aware of.  Review of Systems     Past Medical History:  Diagnosis Date   Breast cancer (HCC) 01/2021   left breast IDC   Breast cancer, right breast (HCC) 12/2007   a. s/p chemo, radiation, and lumpectomy with negative sentinel lymph node biopsy    Complication of anesthesia    woke up during colonoscopy   Depression    Diabetes mellitus without complication (HCC)    GERD (gastroesophageal reflux disease)    Hyperlipidemia    Hypertension    resistant, negative renal Dopplers and negative CT angiogram   Migraines    Obesity     Current Outpatient Medications  Medication Sig Dispense Refill   amLODipine (NORVASC) 10 MG tablet Take 1 tablet (10 mg total) by mouth daily. 90 tablet 1   aspirin EC 81 MG tablet Take 1 tablet (81 mg total) by mouth daily. Swallow whole. 30 tablet 12   Biotin w/ Vitamins C & E (HAIR SKIN & NAILS GUMMIES PO) Take by mouth.     calcium carbonate (OSCAL) 1500 (600 Ca) MG TABS tablet Take by mouth 2 (two) times daily with a meal.     carvedilol (COREG) 12.5 MG tablet Take 12.5 mg by mouth in the morning and at bedtime.     fluticasone (FLONASE) 50 MCG/ACT nasal spray Place 2 sprays into  both nostrils daily for 7 days. 1 g 0   hydrALAZINE (APRESOLINE) 100 MG tablet TAKE 1 TABLET BY MOUTH 3 TIMES  DAILY 300 tablet 1   irbesartan (AVAPRO) 300 MG tablet TAKE 1 TABLET BY MOUTH DAILY 90 tablet 0   Lancets (ONETOUCH DELICA PLUS LANCET33G) MISC CHECK BLOOD SUGAR TWICE DAILY 200 each 2   letrozole (FEMARA) 2.5 MG tablet TAKE 1 TABLET BY MOUTH DAILY 90 tablet 3   lidocaine (LIDODERM) 5 % Place 1 patch onto the skin daily. Remove & Discard patch within 12 hours or as directed by MD 5 patch 0   metFORMIN (GLUCOPHAGE) 1000 MG tablet Take 1,000 mg by mouth 2 (two) times daily with a meal.     methocarbamol (ROBAXIN) 500 MG tablet Take 1 tablet (500 mg total) by mouth every 8 (eight) hours as needed for muscle spasms. 30 tablet 0   Multiple Vitamins-Minerals (ONE-A-DAY WOMENS VITACRAVES) CHEW Chew 1 tablet by mouth daily.      naproxen (NAPROSYN) 250 MG tablet Take 2 tablets (500 mg total) by mouth 2 (two) times daily with a meal. 30 tablet 0   ONETOUCH ULTRA test strip USE TO CHECK BLOOD SUGAR TWICE  DAILY AS DIRECTED 200 strip 2   pravastatin (PRAVACHOL) 40 MG tablet TAKE 1 TABLET BY MOUTH DAILY  90 tablet 3   repaglinide (PRANDIN) 0.5 MG tablet Take 0.5 mg by mouth 3 (three) times daily before meals.     No current facility-administered medications for this visit.    No Known Allergies  Family History  Problem Relation Age of Onset   Stroke Mother    Colon cancer Father 57   Heart disease Father    Asthma Father    Lung cancer Sister    Colon cancer Brother 55   Diabetes Brother    Breast cancer Niece        dx. <50   Breast cancer Niece        dx. <50   Breast cancer Niece        dx. >50   Esophageal cancer Neg Hx    Rectal cancer Neg Hx    Stomach cancer Neg Hx     Social History   Socioeconomic History   Marital status: Married    Spouse name: Mckynleigh Rim   Number of children: 3   Years of education: 12+   Highest education level: Some college, no degree   Occupational History   Occupation: Disabled  Tobacco Use   Smoking status: Never   Smokeless tobacco: Never  Vaping Use   Vaping status: Never Used  Substance and Sexual Activity   Alcohol use: No    Alcohol/week: 0.0 standard drinks of alcohol   Drug use: No   Sexual activity: Yes  Other Topics Concern   Not on file  Social History Narrative   Lives at home with husband.   Caffeine use: 1-2 drinks per month         Epworth Sleepiness Scale = 15 (as of 01/01/15)   Social Determinants of Health   Financial Resource Strain: Low Risk  (07/29/2022)   Overall Financial Resource Strain (CARDIA)    Difficulty of Paying Living Expenses: Not hard at all  Food Insecurity: No Food Insecurity (07/29/2022)   Hunger Vital Sign    Worried About Running Out of Food in the Last Year: Never true    Ran Out of Food in the Last Year: Never true  Transportation Needs: No Transportation Needs (07/29/2022)   PRAPARE - Administrator, Civil Service (Medical): No    Lack of Transportation (Non-Medical): No  Physical Activity: Insufficiently Active (07/29/2022)   Exercise Vital Sign    Days of Exercise per Week: 2 days    Minutes of Exercise per Session: 20 min  Stress: No Stress Concern Present (07/29/2022)   [employer] of Occupational Health - Occupational Stress Questionnaire    Feeling of Stress : Not at all  Social Connections: Socially Integrated (07/29/2022)   Social Connection and Isolation Panel [NHANES]    Frequency of Communication with Friends and Family: More than three times a week    Frequency of Social Gatherings with Friends and Family: More than three times a week    Attends Religious Services: More than 4 times per year    Active Member of [employer] or Organizations: Yes    Attends Banker Meetings: More than 4 times per year    Marital Status: Married  Catering manager Violence: Not At Risk (07/29/2022)   Humiliation, Afraid, Rape, and Kick  questionnaire    Fear of Current or Ex-Partner: No    Emotionally Abused: No    Physically Abused: No    Sexually Abused: No     Constitutional: Patient reports intermittent headaches.  Denies fever,  malaise, fatigue, or abrupt weight changes.  HEENT: Patient reports nasal congestion, ear pain and sore throat.  Denies eye pain, eye redness, ringing in the ears, wax buildup, runny nose, bloody nose. Respiratory: Patient reports cough.  Denies difficulty breathing, shortness of breath, or sputum production.   Cardiovascular: Denies chest pain, chest tightness, palpitations or swelling in the hands or feet.  Gastrointestinal: Denies abdominal pain, bloating, constipation, diarrhea or blood in the stool.   No other specific complaints in a complete review of systems (except as listed in HPI above).  Objective:   Physical Exam  BP 128/82 (BP Location: Left Arm, Patient Position: Sitting, Cuff Size: Normal)   Pulse 75   Temp (!) 96.7 F (35.9 C) (Temporal)   Wt 210 lb (95.3 kg)   SpO2 97%   BMI 34.95 kg/m   Wt Readings from Last 3 Encounters:  09/27/22 210 lb (95.3 kg)  09/13/22 207 lb 0.2 oz (93.9 kg)  07/29/22 207 lb (93.9 kg)    General: Appears her stated age, obese, in NAD. Skin: Warm, dry and intact.  HEENT: Head: normal shape and size, no sinus tenderness noted; Eyes: sclera white, no icterus, conjunctiva pink, PERRLA and EOMs intact; Right Ear: Tm's gray and intact, normal light reflex; Left Ear: Cerumen impaction; Nose: mucosa boggy and moist, turbinates swollen, septum midline; Throat/Mouth: Teeth present, mucosa erythematous and moist, + PND, no exudate, lesions or ulcerations noted.  Neck: No adenopathy noted. Cardiovascular: Normal rate and rhythm. S1,S2 noted.  No murmur, rubs or gallops noted.  Pulmonary/Chest: Normal effort and positive vesicular breath sounds. No respiratory distress. No wheezes, rales or ronchi noted.  Neurological: Alert and oriented.   BMET     Component Value Date/Time   NA 140 09/13/2022 0524   NA 146 (H) 01/20/2015 1457   NA 143 10/12/2013 1510   K 3.7 09/13/2022 0524   K 3.9 10/12/2013 1510   CL 104 09/13/2022 0524   CL 109 (H) 10/12/2013 1510   CO2 25 09/13/2022 0524   CO2 29 10/12/2013 1510   GLUCOSE 84 09/13/2022 0524   GLUCOSE 92 10/12/2013 1510   BUN 20 09/13/2022 0524   BUN 17 01/20/2015 1457   BUN 16 10/12/2013 1510   CREATININE 1.04 (H) 09/13/2022 0524   CREATININE 1.16 (H) 05/03/2022 0932   CALCIUM 9.4 09/13/2022 0524   CALCIUM 9.4 10/12/2013 1510   GFRNONAA 58 (L) 09/13/2022 0524   GFRNONAA 56 (L) 10/12/2013 1510   GFRAA >60 01/24/2018 2159   GFRAA >60 10/12/2013 1510    Lipid Panel     Component Value Date/Time   CHOL 170 01/18/2022 0924   CHOL 179 10/17/2014 1527   TRIG 79 01/18/2022 0924   HDL 75 01/18/2022 0924   HDL 59 10/17/2014 1527   CHOLHDL 2.3 01/18/2022 0924   VLDL 15.2 01/09/2020 0957   LDLCALC 79 01/18/2022 0924    CBC    Component Value Date/Time   WBC 4.5 09/13/2022 0524   RBC 4.13 09/13/2022 0524   HGB 12.2 09/13/2022 0524   HGB 13.5 10/12/2013 1510   HGB 12.8 08/13/2009 0946   HCT 35.7 (L) 09/13/2022 0524   HCT 40.5 10/12/2013 1510   HCT 36.8 08/13/2009 0946   PLT 221 09/13/2022 0524   PLT 278 10/12/2013 1510   PLT 271 08/13/2009 0946   MCV 86.4 09/13/2022 0524   MCV 87 10/12/2013 1510   MCV 87.0 08/13/2009 0946   MCH 29.5 09/13/2022 0524   MCHC 34.2  09/13/2022 0524   RDW 12.8 09/13/2022 0524   RDW 13.6 10/12/2013 1510   RDW 13.8 08/13/2009 0946   LYMPHSABS 0.8 10/13/2021 0013   LYMPHSABS 1.5 08/13/2009 0946   MONOABS 0.7 10/13/2021 0013   MONOABS 0.2 08/13/2009 0946   EOSABS 0.0 10/13/2021 0013   EOSABS 0.1 08/13/2009 0946   BASOSABS 0.0 10/13/2021 0013   BASOSABS 0.0 08/13/2009 0946    Hgb A1C Lab Results  Component Value Date   HGBA1C 6.0 (H) 05/03/2022            Assessment & Plan:   Urgent care follow-up for viral URI with  cough:  Urgent care notes and labs reviewed Continue Flonase Rx for prednisone 20 mg x 5 days No indication for antibiotics at this time however if she is not feeling any better, will consider azithromycin or Augmentin early next week  Cerumen impaction, left:  Manual lavage by CMA unsuccessful Advised her to try Debrox 2 times weekly to prevent wax buildup She was given a referral to ENT by urgent care and will give them a call to get an appointment for wax removal  RTC in 3 months for your annual exam Nicki Reaper, NP

## 2022-09-29 NOTE — Addendum Note (Signed)
Addended by: Kavin Leech E on: 09/29/2022 04:05 PM   Modules accepted: Orders

## 2022-09-29 NOTE — Patient Instructions (Signed)

## 2022-10-02 ENCOUNTER — Other Ambulatory Visit: Payer: Self-pay | Admitting: Internal Medicine

## 2022-10-04 NOTE — Telephone Encounter (Signed)
Requested Prescriptions  Pending Prescriptions Disp Refills   irbesartan (AVAPRO) 300 MG tablet [Pharmacy Med Name: Irbesartan 300 MG Oral Tablet] 90 tablet 0    Sig: TAKE 1 TABLET BY MOUTH DAILY     Cardiovascular:  Angiotensin Receptor Blockers Failed - 10/02/2022 10:37 PM      Failed - Cr in normal range and within 180 days    Creat  Date Value Ref Range Status  05/03/2022 1.16 (H) 0.50 - 1.05 mg/dL Final   Creatinine, Ser  Date Value Ref Range Status  09/13/2022 1.04 (H) 0.44 - 1.00 mg/dL Final   Creatinine,U  Date Value Ref Range Status  04/03/2014 447.0 mg/dL Final   Creatinine, Urine  Date Value Ref Range Status  01/18/2022 172 20 - 275 mg/dL Final         Passed - K in normal range and within 180 days    Potassium  Date Value Ref Range Status  09/13/2022 3.7 3.5 - 5.1 mmol/L Final  10/12/2013 3.9 3.5 - 5.1 mmol/L Final         Passed - Patient is not pregnant      Passed - Last BP in normal range    BP Readings from Last 1 Encounters:  09/29/22 128/82         Passed - Valid encounter within last 6 months    Recent Outpatient Visits           5 days ago Viral URI with cough   [place] [facility] [place], Salvadore Oxford, NP   3 months ago Aortic atherosclerosis [facility])   [place] [facility] [place], Salvadore Oxford, NP   5 months ago Frequent headaches   [place] [facility] [place], Salvadore Oxford, NP   8 months ago Encounter for general adult medical examination with abnormal findings   [place] [facility] [place], Salvadore Oxford, NP   10 months ago Right hip pain   [place] [facility] [place], Salvadore Oxford, NP       Future Appointments             In 3 months [place], Salvadore Oxford, NP [place] [facility], [facility]

## 2022-10-05 ENCOUNTER — Encounter: Payer: Self-pay | Admitting: Internal Medicine

## 2022-10-05 MED ORDER — AZITHROMYCIN 250 MG PO TABS
ORAL_TABLET | ORAL | 0 refills | Status: DC
Start: 2022-10-05 — End: 2022-12-29

## 2022-10-10 ENCOUNTER — Encounter: Payer: Self-pay | Admitting: Internal Medicine

## 2022-10-11 DIAGNOSIS — J029 Acute pharyngitis, unspecified: Secondary | ICD-10-CM | POA: Diagnosis not present

## 2022-10-11 DIAGNOSIS — H6122 Impacted cerumen, left ear: Secondary | ICD-10-CM | POA: Diagnosis not present

## 2022-10-12 ENCOUNTER — Other Ambulatory Visit: Payer: Self-pay | Admitting: Hematology and Oncology

## 2022-10-25 DIAGNOSIS — C50912 Malignant neoplasm of unspecified site of left female breast: Secondary | ICD-10-CM | POA: Diagnosis not present

## 2022-11-07 ENCOUNTER — Other Ambulatory Visit: Payer: Self-pay | Admitting: Internal Medicine

## 2022-11-09 NOTE — Telephone Encounter (Signed)
Requested Prescriptions  Pending Prescriptions Disp Refills   pravastatin (PRAVACHOL) 40 MG tablet [Pharmacy Med Name: Pravastatin Sodium 40 MG Oral Tablet] 100 tablet 1    Sig: TAKE 1 TABLET BY MOUTH DAILY     Cardiovascular:  Antilipid - Statins Failed - 11/07/2022 10:13 PM      Failed - Lipid Panel in normal range within the last 12 months    Cholesterol, Total  Date Value Ref Range Status  10/17/2014 179 100 - 199 mg/dL Final   Cholesterol  Date Value Ref Range Status  01/18/2022 170 <200 mg/dL Final   LDL Cholesterol (Calc)  Date Value Ref Range Status  01/18/2022 79 mg/dL (calc) Final    Comment:    Reference range: <100 . Desirable range <100 mg/dL for primary prevention;   <70 mg/dL for patients with CHD or diabetic patients  with > or = 2 CHD risk factors. Marland Kitchen LDL-C is now calculated using the Martin-Hopkins  calculation, which is a validated novel method providing  better accuracy than the Friedewald equation in the  estimation of LDL-C.  Horald Pollen et al. Lenox Ahr. 3086;578(46): 2061-2068  (http://education.[facility].com/faq/FAQ164)    Direct LDL  Date Value Ref Range Status  09/05/2013 169.6 mg/dL Final    Comment:    Optimal:  <100 mg/dLNear or Above Optimal:  100-129 mg/dLBorderline High:  130-159 mg/dLHigh:  160-189 mg/dLVery High:  >190 mg/dL   HDL  Date Value Ref Range Status  01/18/2022 75 > OR = 50 mg/dL Final  96/29/5284 59 >13 mg/dL Final    Comment:    According to ATP-III Guidelines, HDL-C >59 mg/dL is considered a negative risk factor for CHD.    Triglycerides  Date Value Ref Range Status  01/18/2022 79 <150 mg/dL Final         Passed - Patient is not pregnant      Passed - Valid encounter within last 12 months    Recent Outpatient Visits           1 month ago Viral URI with cough   [place] [facility] [place], Salvadore Oxford, NP   4 months ago Aortic atherosclerosis [facility])   [place] [facility]  [place], Salvadore Oxford, NP   6 months ago Frequent headaches   [place] [facility] [place], Salvadore Oxford, NP   9 months ago Encounter for general adult medical examination with abnormal findings   [place] [facility] [place], Salvadore Oxford, NP   11 months ago Right hip pain   [place] [facility] [place], Salvadore Oxford, NP       Future Appointments             In 2 months [place], Salvadore Oxford, NP  [facility]             hydrALAZINE (APRESOLINE) 100 MG tablet [Pharmacy Med Name: hydrALAZINE HCl 100 MG Oral Tablet] 300 tablet 1    Sig: TAKE 1 TABLET BY MOUTH 3 TIMES  DAILY     Cardiovascular:  Vasodilators Failed - 11/07/2022 10:13 PM      Failed - HCT in normal range and within 360 days    HCT  Date Value Ref Range Status  09/13/2022 35.7 (L) 36.0 - 46.0 % Final  10/12/2013 40.5 35.0 - 47.0 % Final  08/13/2009 36.8 34.8 - 46.6 % Final         Failed - ANA Screen,  Ifa, Serum in normal range and within 360 days    Anti Nuclear Antibody(ANA)  Date Value Ref Range Status  10/17/2014 Negative Negative Final         Passed - HGB in normal range and within 360 days    Hemoglobin  Date Value Ref Range Status  09/13/2022 12.2 12.0 - 15.0 g/dL Final   HGB  Date Value Ref Range Status  10/12/2013 13.5 12.0 - 16.0 g/dL Final  16/10/9602 54.0 11.6 - 15.9 g/dL Final         Passed - RBC in normal range and within 360 days    RBC  Date Value Ref Range Status  09/13/2022 4.13 3.87 - 5.11 MIL/uL Final         Passed - WBC in normal range and within 360 days    WBC  Date Value Ref Range Status  09/13/2022 4.5 4.0 - 10.5 K/uL Final         Passed - PLT in normal range and within 360 days    Platelets  Date Value Ref Range Status  09/13/2022 221 150 - 400 K/uL Final  08/13/2009 271 145 - 400 10e3/uL Final   Platelet  Date Value Ref Range Status  10/12/2013 278 150 - 440 x10 3/mm 3 Final         Passed -  Last BP in normal range    BP Readings from Last 1 Encounters:  09/29/22 128/82         Passed - Valid encounter within last 12 months    Recent Outpatient Visits           1 month ago Viral URI with cough   [place] [facility] [place], Salvadore Oxford, NP   4 months ago Aortic atherosclerosis [facility])   [place] [facility] [place], Salvadore Oxford, NP   6 months ago Frequent headaches   [place] [facility] [place], Salvadore Oxford, NP   9 months ago Encounter for general adult medical examination with abnormal findings   [place] [facility] [place], Salvadore Oxford, NP   11 months ago Right hip pain   [place] [facility] [place], Salvadore Oxford, NP       Future Appointments             In 2 months Baity, Salvadore Oxford, NP [place] [facility], [facility]

## 2022-12-04 ENCOUNTER — Other Ambulatory Visit: Payer: Self-pay | Admitting: Internal Medicine

## 2022-12-05 NOTE — Telephone Encounter (Signed)
Requested Prescriptions  Pending Prescriptions Disp Refills   irbesartan (AVAPRO) 300 MG tablet [Pharmacy Med Name: Irbesartan 300 MG Oral Tablet] 90 tablet 0    Sig: TAKE 1 TABLET BY MOUTH DAILY     Cardiovascular:  Angiotensin Receptor Blockers Failed - 12/04/2022 10:39 PM      Failed - Cr in normal range and within 180 days    Creat  Date Value Ref Range Status  05/03/2022 1.16 (H) 0.50 - 1.05 mg/dL Final   Creatinine, Ser  Date Value Ref Range Status  09/13/2022 1.04 (H) 0.44 - 1.00 mg/dL Final   Creatinine,U  Date Value Ref Range Status  04/03/2014 447.0 mg/dL Final   Creatinine, Urine  Date Value Ref Range Status  01/18/2022 172 20 - 275 mg/dL Final         Passed - K in normal range and within 180 days    Potassium  Date Value Ref Range Status  09/13/2022 3.7 3.5 - 5.1 mmol/L Final  10/12/2013 3.9 3.5 - 5.1 mmol/L Final         Passed - Patient is not pregnant      Passed - Last BP in normal range    BP Readings from Last 1 Encounters:  09/29/22 128/82         Passed - Valid encounter within last 6 months    Recent Outpatient Visits           2 months ago Viral URI with cough   [place] [facility] [place], Salvadore Oxford, NP   5 months ago Aortic atherosclerosis [facility])   [place] [facility] Lorre Munroe, NP   7 months ago Frequent headaches   [place] [facility] [place], Salvadore Oxford, NP   10 months ago Encounter for general adult medical examination with abnormal findings   [place] [facility] [place], Salvadore Oxford, NP   1 year ago Right hip pain   [place] [facility] [place], Salvadore Oxford, NP       Future Appointments             In 1 month Roff, Salvadore Oxford, NP [place] [facility], [facility]

## 2022-12-08 ENCOUNTER — Encounter: Payer: Self-pay | Admitting: Internal Medicine

## 2022-12-21 ENCOUNTER — Other Ambulatory Visit: Payer: Self-pay | Admitting: Hematology and Oncology

## 2022-12-29 ENCOUNTER — Inpatient Hospital Stay: Payer: Medicare Other | Attending: Hematology and Oncology | Admitting: Hematology and Oncology

## 2022-12-29 ENCOUNTER — Telehealth: Payer: Self-pay

## 2022-12-29 VITALS — BP 131/66 | HR 87 | Temp 98.0°F | Resp 18 | Ht 65.0 in | Wt 207.9 lb

## 2022-12-29 DIAGNOSIS — R5383 Other fatigue: Secondary | ICD-10-CM | POA: Insufficient documentation

## 2022-12-29 DIAGNOSIS — C50912 Malignant neoplasm of unspecified site of left female breast: Secondary | ICD-10-CM | POA: Diagnosis not present

## 2022-12-29 DIAGNOSIS — Z853 Personal history of malignant neoplasm of breast: Secondary | ICD-10-CM | POA: Insufficient documentation

## 2022-12-29 DIAGNOSIS — Z1722 Progesterone receptor negative status: Secondary | ICD-10-CM | POA: Insufficient documentation

## 2022-12-29 DIAGNOSIS — E119 Type 2 diabetes mellitus without complications: Secondary | ICD-10-CM | POA: Insufficient documentation

## 2022-12-29 DIAGNOSIS — Z9013 Acquired absence of bilateral breasts and nipples: Secondary | ICD-10-CM | POA: Insufficient documentation

## 2022-12-29 DIAGNOSIS — Z79899 Other long term (current) drug therapy: Secondary | ICD-10-CM | POA: Diagnosis not present

## 2022-12-29 DIAGNOSIS — Z170 Estrogen receptor positive status [ER+]: Secondary | ICD-10-CM | POA: Insufficient documentation

## 2022-12-29 MED ORDER — LETROZOLE 2.5 MG PO TABS: 2.5000 mg | ORAL_TABLET | Freq: Every day | ORAL | 3 refills | Status: DC

## 2022-12-29 NOTE — Progress Notes (Signed)
Patient Care Team: Lorre Munroe, NP as PCP - General (Internal Medicine) Antonieta Iba, MD as Consulting Physician (Cardiology) Serena Croissant, MD as Consulting Physician (Hematology and Oncology) Axel Filler Larna Daughters, NP as Nurse Practitioner (Hematology and Oncology) Harriette Bouillon, MD as Consulting Physician (General Surgery)  DIAGNOSIS:  Encounter Diagnosis  Name Primary?   History of breast cancer Yes    SUMMARY OF ONCOLOGIC HISTORY: Oncology History  History of breast cancer  12/18/2007 Initial Diagnosis   Right breast cancer treated with lumpectomy followed by radiation and 5 years of antiestrogen therapy with aromatase inhibitor   02/02/2016 Relapse/Recurrence   Right breast biopsy 6:00 position: IDC with DCIS grade 2, ER 100%, PR 90%, Ki-67 10%, HER-2 negative ratio 1.28, screening detected right breast lumps 0.5 cm and 0.3 cm, T1a N0 stage IA clinical stage   03/18/2016 Surgery   Right simple mastectomy: Grade 2 IDC with DCIS 1.5 cm, margins -0/3 lymph nodes, T1b N0 stage IA, ER 100%, PR 90%, Ki-67 10%, HER-2 negative ratio 1.28   03/18/2016 Oncotype testing   11/7%   04/2016 -  Anti-estrogen oral therapy   Letrozole 2.5mg  daily switch to exemestane 25 mg daily May 2018 switched to anastrozole 1 mg daily due to cost   03/10/2021 Surgery   Left Mastectomy : Grade 2 IDC 1.1 cm, 0/8 LN neg, ER 100%, PR: 0%, ki 67: 15%, HER 2 neg      04/2021 -  Anti-estrogen oral therapy   Letrozole daily    Genetic Testing   [facility] CancerNext-Expanded Panel was Negative. Report date is 08/09/2021.  The CancerNext-Expanded gene panel offered by [facility] and includes sequencing, rearrangement, and RNA analysis for the following 77 genes: AIP, ALK, APC, ATM, AXIN2, BAP1, BARD1, BLM, BMPR1A, BRCA1, BRCA2, BRIP1, CDC73, CDH1, CDK4, CDKN1B, CDKN2A, CHEK2, CTNNA1, DICER1, FANCC, FH, FLCN, GALNT12, KIF1B, LZTR1, MAX, MEN1, MET, MLH1, MSH2, MSH3, MSH6, MUTYH, NBN, NF1, NF2, NTHL1,  PALB2, PHOX2B, PMS2, POT1, PRKAR1A, PTCH1, PTEN, RAD51C, RAD51D, RB1, RECQL, RET, SDHA, SDHAF2, SDHB, SDHC, SDHD, SMAD4, SMARCA4, SMARCB1, SMARCE1, STK11, SUFU, TMEM127, TP53, TSC1, TSC2, VHL and XRCC2 (sequencing and deletion/duplication); EGFR, EGLN1, HOXB13, KIT, MITF, PDGFRA, POLD1, and POLE (sequencing only); EPCAM and GREM1 (deletion/duplication only).    Malignant neoplasm of left breast in female, estrogen receptor positive (HCC) (Resolved)  02/18/2021 Initial Diagnosis   Malignant neoplasm of left breast in female, estrogen receptor positive (HCC)   03/10/2021 Cancer Staging   Staging form: Breast, AJCC 8th Edition - Pathologic stage from 03/10/2021: Stage IA (pT1c, pN0, cM0, G2, ER+, PR-, HER2-) - Signed by Loa Socks, NP on 06/21/2021 Stage prefix: Initial diagnosis Histologic grading system: 3 grade system    Genetic Testing   [facility] CancerNext-Expanded Panel was Negative. Report date is 08/09/2021.  The CancerNext-Expanded gene panel offered by [facility] and includes sequencing, rearrangement, and RNA analysis for the following 77 genes: AIP, ALK, APC, ATM, AXIN2, BAP1, BARD1, BLM, BMPR1A, BRCA1, BRCA2, BRIP1, CDC73, CDH1, CDK4, CDKN1B, CDKN2A, CHEK2, CTNNA1, DICER1, FANCC, FH, FLCN, GALNT12, KIF1B, LZTR1, MAX, MEN1, MET, MLH1, MSH2, MSH3, MSH6, MUTYH, NBN, NF1, NF2, NTHL1, PALB2, PHOX2B, PMS2, POT1, PRKAR1A, PTCH1, PTEN, RAD51C, RAD51D, RB1, RECQL, RET, SDHA, SDHAF2, SDHB, SDHC, SDHD, SMAD4, SMARCA4, SMARCB1, SMARCE1, STK11, SUFU, TMEM127, TP53, TSC1, TSC2, VHL and XRCC2 (sequencing and deletion/duplication); EGFR, EGLN1, HOXB13, KIT, MITF, PDGFRA, POLD1, and POLE (sequencing only); EPCAM and GREM1 (deletion/duplication only).      CHIEF COMPLIANT: Follow-up of recurrent breast cancer  History of Present Illness   The patient, with a history of multiple breast cancer events in 2009, 2018, and 2023, presents with fatigue. She suspects that the fatigue may be due to  her extensive medication regimen, but she is reluctant to add more medications to manage this symptom. She is currently taking letrozole, which she tolerates well without side effects. She also occasionally uses a lidocaine patch for pain management. She has a history of diabetes, which could also contribute to her fatigue.  The patient also mentions a previous prescription for prednisone and azithromycin, but she is no longer taking these medications. She takes Robaxin as needed for muscle relaxation. She has had bilateral mastectomies in the past.         ALLERGIES:  has no known allergies.  MEDICATIONS:  Current Outpatient Medications  Medication Sig Dispense Refill   amLODipine (NORVASC) 10 MG tablet Take 1 tablet (10 mg total) by mouth daily. 90 tablet 1   aspirin EC 81 MG tablet Take 1 tablet (81 mg total) by mouth daily. Swallow whole. 30 tablet 12   Biotin w/ Vitamins C & E (HAIR SKIN & NAILS GUMMIES PO) Take by mouth.     calcium carbonate (OSCAL) 1500 (600 Ca) MG TABS tablet Take by mouth 2 (two) times daily with a meal.     carvedilol (COREG) 12.5 MG tablet Take 12.5 mg by mouth in the morning and at bedtime.     fluticasone (FLONASE) 50 MCG/ACT nasal spray Place 2 sprays into both nostrils daily for 7 days. 1 g 0   hydrALAZINE (APRESOLINE) 100 MG tablet TAKE 1 TABLET BY MOUTH 3 TIMES  DAILY 300 tablet 1   irbesartan (AVAPRO) 300 MG tablet TAKE 1 TABLET BY MOUTH DAILY 90 tablet 0   Lancets (ONETOUCH DELICA PLUS LANCET33G) MISC CHECK BLOOD SUGAR TWICE DAILY 200 each 2   letrozole (FEMARA) 2.5 MG tablet Take 1 tablet (2.5 mg total) by mouth daily. 90 tablet 3   lidocaine (LIDODERM) 5 % Place 1 patch onto the skin daily. Remove & Discard patch within 12 hours or as directed by MD 5 patch 0   metFORMIN (GLUCOPHAGE) 1000 MG tablet Take 1,000 mg by mouth 2 (two) times daily with a meal.     Multiple Vitamins-Minerals (ONE-A-DAY WOMENS VITACRAVES) CHEW Chew 1 tablet by mouth daily.       naproxen (NAPROSYN) 250 MG tablet Take 2 tablets (500 mg total) by mouth 2 (two) times daily with a meal. 30 tablet 0   ONETOUCH ULTRA test strip USE TO CHECK BLOOD SUGAR TWICE  DAILY AS DIRECTED 200 strip 2   pravastatin (PRAVACHOL) 40 MG tablet TAKE 1 TABLET BY MOUTH DAILY 100 tablet 1   repaglinide (PRANDIN) 0.5 MG tablet Take 0.5 mg by mouth 3 (three) times daily before meals.     No current facility-administered medications for this visit.    PHYSICAL EXAMINATION: ECOG PERFORMANCE STATUS: 1 - Symptomatic but completely ambulatory  Vitals:   12/29/22 1032  BP: 131/66  Pulse: 87  Resp: 18  Temp: 98 F (36.7 C)  SpO2: 99%   Filed Weights   12/29/22 1032  Weight: 207 lb 14.4 oz (94.3 kg)      LABORATORY DATA:  I have reviewed the data as listed    Latest Ref Rng & Units 09/13/2022    5:24 AM 05/03/2022    9:32 AM 01/18/2022    9:24 AM  CMP  Glucose 70 - 99 mg/dL 84  114  92   BUN 8 - 23 mg/dL 20  24  18    Creatinine 0.44 - 1.00 mg/dL 6.29  5.28  4.13   Sodium 135 - 145 mmol/L 140  143  140   Potassium 3.5 - 5.1 mmol/L 3.7  4.5  4.2   Chloride 98 - 111 mmol/L 104  105  104   CO2 22 - 32 mmol/L 25  30  29    Calcium 8.9 - 10.3 mg/dL 9.4  24.4  01.0   Total Protein 6.1 - 8.1 g/dL  6.9  6.9   Total Bilirubin 0.2 - 1.2 mg/dL  1.0  0.7   AST 10 - 35 U/L  12  11   ALT 6 - 29 U/L  11  11     Lab Results  Component Value Date   WBC 4.5 09/13/2022   HGB 12.2 09/13/2022   HCT 35.7 (L) 09/13/2022   MCV 86.4 09/13/2022   PLT 221 09/13/2022   NEUTROABS 3.7 10/13/2021    ASSESSMENT & PLAN:  History of breast cancer 02/02/2016: Right simple mastectomy: Grade 2 IDC with DCIS 1.5 cm, margins -0/3 lymph nodes, T1b N0 stage IA, ER 100%, PR 90%, Ki-67 10%, HER-2 negative ratio 1.28 Oncotype DX score 11:7% risk of recurrence (2009 right breast cancer treated with lumpectomy radiation and 5 years of antiestrogen therapy)   Current treatment: Letrozole 2.5 mg daily started 04/2016  switched to exemestane 05/26/2016 switched to anastrozole, switched to letrozole 03/18/2021   Breast cancer recurrence 1. Mammogram left breast: With ultrasound 02/02/2021: 0.6 cm irregular hypoechoic mass: Biopsy: Grade 2 invasive ductal carcinoma ER 100%, PR 0%, HER2 equivocal, FISH negative   Treatment plan: Left Mastectomy : Grade 2 IDC 1.1 cm, 0/8 LN neg, ER 100%, PR: 0%, ki 67: 15%, HER 2 neg Followed by adjuvant antiestrogen therapy.   Letrozole.   Letrozole toxicities: Tolerating extremely well without any problems or concerns.   I recommend that we do guardant reveal for MRD monitoring   Return to clinic in 1 year for follow-up ------------------------------------- Assessment and Plan    Breast Cancer History of bilateral mastectomies with no current complaints or concerns. She does not need mammograms since she had bilateral mastectomies -Enroll in [facility] blood test program for biannual cancer detection screenings.  Medication Management Patient reports fatigue potentially related to current medication regimen.  Letrozole is well-tolerated with no reported side effects. -Continue Letrozole as tolerated. -Refill Letrozole prescription via [facility].  Diabetes Possible contributor to patient's reported fatigue. -No changes to current management plan.  Follow-up Annual in-person visits with biannual [facility] blood tests. -Schedule next in-person visit in one year. -Arrange for [facility] to conduct first blood test in the coming weeks.          No orders of the defined types were placed in this encounter.  The patient has a good understanding of the overall plan. she agrees with it. she will call with any problems that may develop before the next visit here. Total time spent: 30 mins including face to face time and time spent for planning, charting and co-ordination of care   Tamsen Meek, MD 12/29/22

## 2022-12-29 NOTE — Telephone Encounter (Signed)
 Per md orders entered for [employer] and all supported documents faxed to [phone]. Faxed confirmation was received.

## 2022-12-29 NOTE — Assessment & Plan Note (Addendum)
02/02/2016: Right simple mastectomy: Grade 2 IDC with DCIS 1.5 cm, margins -0/3 lymph nodes, T1b N0 stage IA, ER 100%, PR 90%, Ki-67 10%, HER-2 negative ratio 1.28 Oncotype DX score 11:7% risk of recurrence (2009 right breast cancer treated with lumpectomy radiation and 5 years of antiestrogen therapy)   Current treatment: Letrozole 2.5 mg daily started 04/2016 switched to exemestane 05/26/2016 switched to anastrozole, switched to letrozole 03/18/2021   Breast cancer recurrence 1. Mammogram left breast: With ultrasound 02/02/2021: 0.6 cm irregular hypoechoic mass: Biopsy: Grade 2 invasive ductal carcinoma ER 100%, PR 0%, HER2 equivocal, FISH negative   Treatment plan: Left Mastectomy : Grade 2 IDC 1.1 cm, 0/8 LN neg, ER 100%, PR: 0%, ki 67: 15%, HER 2 neg Followed by adjuvant antiestrogen therapy.   Letrozole.   Letrozole toxicities: Tolerating extremely well without any problems or concerns.   I recommend that we do guardant reveal for MRD monitoring   Return to clinic in 1 year for follow-up

## 2023-01-03 NOTE — Telephone Encounter (Signed)
Open in error

## 2023-01-05 ENCOUNTER — Other Ambulatory Visit: Payer: Self-pay

## 2023-01-05 ENCOUNTER — Emergency Department (HOSPITAL_BASED_OUTPATIENT_CLINIC_OR_DEPARTMENT_OTHER)
Admission: EM | Admit: 2023-01-05 | Discharge: 2023-01-05 | Disposition: A | Discharge status: Home / Self Care | Payer: Medicare Other | Attending: Emergency Medicine | Admitting: Emergency Medicine

## 2023-01-05 ENCOUNTER — Emergency Department (HOSPITAL_BASED_OUTPATIENT_CLINIC_OR_DEPARTMENT_OTHER): Payer: Medicare Other

## 2023-01-05 ENCOUNTER — Encounter (HOSPITAL_BASED_OUTPATIENT_CLINIC_OR_DEPARTMENT_OTHER): Payer: Self-pay | Admitting: *Deleted

## 2023-01-05 DIAGNOSIS — Z79899 Other long term (current) drug therapy: Secondary | ICD-10-CM | POA: Diagnosis not present

## 2023-01-05 DIAGNOSIS — R1031 Right lower quadrant pain: Secondary | ICD-10-CM | POA: Diagnosis present

## 2023-01-05 DIAGNOSIS — Z853 Personal history of malignant neoplasm of breast: Secondary | ICD-10-CM | POA: Diagnosis not present

## 2023-01-05 DIAGNOSIS — N132 Hydronephrosis with renal and ureteral calculous obstruction: Secondary | ICD-10-CM | POA: Diagnosis not present

## 2023-01-05 DIAGNOSIS — R109 Unspecified abdominal pain: Secondary | ICD-10-CM | POA: Diagnosis not present

## 2023-01-05 DIAGNOSIS — Z7984 Long term (current) use of oral hypoglycemic drugs: Secondary | ICD-10-CM | POA: Insufficient documentation

## 2023-01-05 DIAGNOSIS — K5730 Diverticulosis of large intestine without perforation or abscess without bleeding: Secondary | ICD-10-CM | POA: Diagnosis not present

## 2023-01-05 DIAGNOSIS — E119 Type 2 diabetes mellitus without complications: Secondary | ICD-10-CM | POA: Insufficient documentation

## 2023-01-05 DIAGNOSIS — I10 Essential (primary) hypertension: Secondary | ICD-10-CM | POA: Diagnosis not present

## 2023-01-05 LAB — URINALYSIS, ROUTINE W REFLEX MICROSCOPIC
Bilirubin Urine: NEGATIVE
Glucose, UA: NEGATIVE mg/dL
Ketones, ur: NEGATIVE mg/dL
Leukocytes,Ua: NEGATIVE
Nitrite: NEGATIVE
Protein, ur: NEGATIVE mg/dL
Specific Gravity, Urine: <=1.005 (ref 1.005–1.030)
pH: 6.5 (ref 5.0–8.0)

## 2023-01-05 LAB — CBC
HCT: 34.2 % — ABNORMAL LOW (ref 36.0–46.0)
Hemoglobin: 11.4 g/dL — ABNORMAL LOW (ref 12.0–15.0)
MCH: 29.2 pg (ref 26.0–34.0)
MCHC: 33.3 g/dL (ref 30.0–36.0)
MCV: 87.5 fL (ref 80.0–100.0)
Platelets: 261 10*3/uL (ref 150–400)
RBC: 3.91 MIL/uL (ref 3.87–5.11)
RDW: 13.3 % (ref 11.5–15.5)
WBC: 4.7 10*3/uL (ref 4.0–10.5)
nRBC: 0 % (ref 0.0–0.2)

## 2023-01-05 LAB — COMPREHENSIVE METABOLIC PANEL
ALT: 17 U/L (ref 0–44)
AST: 16 U/L (ref 15–41)
Albumin: 3.9 g/dL (ref 3.5–5.0)
Alkaline Phosphatase: 55 U/L (ref 38–126)
Anion gap: 7 (ref 5–15)
BUN: 19 mg/dL (ref 8–23)
CO2: 26 mmol/L (ref 22–32)
Calcium: 9.6 mg/dL (ref 8.9–10.3)
Chloride: 106 mmol/L (ref 98–111)
Creatinine, Ser: 0.98 mg/dL (ref 0.44–1.00)
GFR, Estimated: >60 mL/min (ref >60)
Glucose, Bld: 105 mg/dL — ABNORMAL HIGH (ref 70–99)
Potassium: 3.9 mmol/L (ref 3.5–5.1)
Sodium: 139 mmol/L (ref 135–145)
Total Bilirubin: 0.8 mg/dL (ref <1.2)
Total Protein: 7 g/dL (ref 6.5–8.1)

## 2023-01-05 LAB — URINALYSIS, MICROSCOPIC (REFLEX)

## 2023-01-05 LAB — LIPASE, BLOOD: Lipase: 171 U/L — ABNORMAL HIGH (ref 11–51)

## 2023-01-05 MED ORDER — OXYCODONE-ACETAMINOPHEN 5-325 MG PO TABS
1.0000 | ORAL_TABLET | Freq: Four times a day (QID) | ORAL | 0 refills | Status: DC | PRN
Start: 2023-01-05 — End: 2023-01-19

## 2023-01-05 MED ORDER — MORPHINE SULFATE (PF) 4 MG/ML IV SOLN
4.0000 mg | Freq: Once | INTRAVENOUS | Status: AC
  Administered 2023-01-05: 4 mg via INTRAVENOUS
  Filled 2023-01-05: qty 1

## 2023-01-05 MED ORDER — KETOROLAC TROMETHAMINE 15 MG/ML IJ SOLN
15.0000 mg | Freq: Once | INTRAMUSCULAR | Status: AC
  Administered 2023-01-05: 15 mg via INTRAVENOUS
  Filled 2023-01-05: qty 1

## 2023-01-05 MED ORDER — TAMSULOSIN HCL 0.4 MG PO CAPS: 0.4000 mg | ORAL_CAPSULE | Freq: Every day | ORAL | 0 refills | Status: DC

## 2023-01-05 MED ORDER — ONDANSETRON HCL 4 MG/2ML IJ SOLN
4.0000 mg | Freq: Once | INTRAMUSCULAR | Status: AC
  Administered 2023-01-05: 4 mg via INTRAVENOUS
  Filled 2023-01-05: qty 2

## 2023-01-05 NOTE — Discharge Instructions (Addendum)
We are prescribing you some pain medicine and some medicine to help the stone pass.  Please return to the emergency department if the pain is not controlled or if you experience any high fevers.  Drink plenty of fluids.  Follow-up with urology.

## 2023-01-05 NOTE — ED Provider Notes (Signed)
[place] [facility] Provider Note   CSN: [id number] Arrival date & time: 01/05/23  1425     History  Chief Complaint  Patient presents with   Abdominal Pain    Carrie Singh is a 67 y.o. female.  She has a history of breast cancer diabetes hypertension hyperlipidemia.  She is complaining of severe right flank into her right lower quadrant pain that started around 9 AM this morning.  Sharp stabbing 10 out of 10.  Has tried nothing for it.  Not associated with any nausea or vomiting or urinary symptoms.  She said she was a little constipated yesterday but moved her bowels twice today without any change in her pain.  No prior history of this pain in the past.  The history is provided by the patient.  Abdominal Pain Pain location:  R flank and RLQ Pain quality: stabbing   Pain severity:  Severe Onset quality:  Sudden Duration:  6 hours Timing:  Constant Progression:  Unchanged Chronicity:  New Context: not trauma   Relieved by:  Nothing Worsened by:  Nothing Ineffective treatments:  None tried Associated symptoms: constipation   Associated symptoms: no chest pain, no cough, no diarrhea, no dysuria, no fever, no hematemesis, no hematochezia, no hematuria, no nausea, no shortness of breath and no vomiting        Home Medications Prior to Admission medications   Medication Sig Start Date End Date Taking? Authorizing Provider  amLODipine (NORVASC) 10 MG tablet Take 1 tablet (10 mg total) by mouth daily. 08/15/22   Lorre Munroe, NP  aspirin EC 81 MG tablet Take 1 tablet (81 mg total) by mouth daily. Swallow whole. 01/18/22   Lorre Munroe, NP  Biotin w/ Vitamins C & E (HAIR SKIN & NAILS GUMMIES PO) Take by mouth.    [provider]  calcium carbonate (OSCAL) 1500 (600 Ca) MG TABS tablet Take by mouth 2 (two) times daily with a meal.    [provider]  carvedilol (COREG) 12.5 MG tablet Take 12.5 mg by mouth in the morning  and at bedtime. 04/30/19   [provider]  fluticasone (FLONASE) 50 MCG/ACT nasal spray Place 2 sprays into both nostrils daily for 7 days. 09/27/22 10/04/22  Long, Arlyss Repress, MD  hydrALAZINE (APRESOLINE) 100 MG tablet TAKE 1 TABLET BY MOUTH 3 TIMES  DAILY 11/09/22   Lorre Munroe, NP  irbesartan (AVAPRO) 300 MG tablet TAKE 1 TABLET BY MOUTH DAILY 12/05/22   Lorre Munroe, NP  Lancets (ONETOUCH DELICA PLUS [place]) MISC CHECK BLOOD SUGAR TWICE DAILY 05/03/22   Lorre Munroe, NP  letrozole [facility]) 2.5 MG tablet Take 1 tablet (2.5 mg total) by mouth daily. 12/29/22   Serena Croissant, MD  lidocaine (LIDODERM) 5 % Place 1 patch onto the skin daily. Remove & Discard patch within 12 hours or as directed by MD 09/13/22   Sabas Sous, MD  metFORMIN (GLUCOPHAGE) 1000 MG tablet Take 1,000 mg by mouth 2 (two) times daily with a meal.    [provider]  Multiple Vitamins-Minerals (ONE-A-DAY WOMENS VITACRAVES) CHEW Chew 1 tablet by mouth daily.     [provider]  naproxen (NAPROSYN) 250 MG tablet Take 2 tablets (500 mg total) by mouth 2 (two) times daily with a meal. 09/13/22   Sabas Sous, MD  [facility] ULTRA test strip USE TO CHECK BLOOD SUGAR TWICE  DAILY AS DIRECTED 05/02/22   Nicki Reaper  W, NP  pravastatin (PRAVACHOL) 40 MG tablet TAKE 1 TABLET BY MOUTH DAILY 11/09/22   Lorre Munroe, NP  repaglinide (PRANDIN) 0.5 MG tablet Take 0.5 mg by mouth 3 (three) times daily before meals.    [provider]      Allergies    Patient has no known allergies.    Review of Systems   Review of Systems  Constitutional:  Negative for fever.  Respiratory:  Negative for cough and shortness of breath.   Cardiovascular:  Negative for chest pain.  Gastrointestinal:  Positive for abdominal pain and constipation. Negative for diarrhea, hematemesis, hematochezia, nausea and vomiting.  Genitourinary:  Negative for dysuria and hematuria.    Physical Exam Updated Vital  Signs BP (!) 142/88 (BP Location: Right Arm)   Pulse 84   Temp 98.5 F (36.9 C) (Oral)   Resp 18   SpO2 100%  Physical Exam Vitals and nursing note reviewed.  Constitutional:      General: She is not in acute distress.    Appearance: Normal appearance. She is well-developed.  HENT:     Head: Normocephalic and atraumatic.  Eyes:     Conjunctiva/sclera: Conjunctivae normal.  Cardiovascular:     Rate and Rhythm: Normal rate and regular rhythm.     Heart sounds: No murmur heard. Pulmonary:     Effort: Pulmonary effort is normal. No respiratory distress.     Breath sounds: Normal breath sounds.  Abdominal:     Palpations: Abdomen is soft.     Tenderness: There is no abdominal tenderness. There is no guarding or rebound.  Musculoskeletal:        General: No swelling.     Cervical back: Neck supple.  Skin:    General: Skin is warm and dry.     Capillary Refill: Capillary refill takes less than 2 seconds.  Neurological:     General: No focal deficit present.     Mental Status: She is alert.     ED Results / Procedures / Treatments   Labs (all labs ordered are listed, but only abnormal results are displayed) Labs Reviewed  LIPASE, BLOOD - Abnormal; Notable for the following components:      Result Value   Lipase 171 (*)    All other components within normal limits  COMPREHENSIVE METABOLIC PANEL - Abnormal; Notable for the following components:   Glucose, Bld 105 (*)    All other components within normal limits  CBC - Abnormal; Notable for the following components:   Hemoglobin 11.4 (*)    HCT 34.2 (*)    All other components within normal limits  URINALYSIS, ROUTINE W REFLEX MICROSCOPIC - Abnormal; Notable for the following components:   Hgb urine dipstick TRACE (*)    All other components within normal limits  URINALYSIS, MICROSCOPIC (REFLEX) - Abnormal; Notable for the following components:   Bacteria, UA RARE (*)    All other components within normal limits     EKG None  Radiology CT Renal Stone Study Result Date: 01/05/2023 CLINICAL DATA:  Flank pain. EXAM: CT ABDOMEN AND PELVIS WITHOUT CONTRAST TECHNIQUE: Multidetector CT imaging of the abdomen and pelvis was performed following the standard protocol without IV contrast. RADIATION DOSE REDUCTION: This exam was performed according to the departmental dose-optimization program which includes automated exposure control, adjustment of the mA and/or kV according to patient size and/or use of iterative reconstruction technique. COMPARISON:  11/28/2014. FINDINGS: Lower chest: Clear.  No pericardial or pleural Hepatobiliary: No focal  liver abnormality is seen. No gallstones, gallbladder wall thickening, or biliary dilatation. Pancreas: Unremarkable. No pancreatic ductal dilatation or surrounding inflammatory changes. Spleen: Normal in size without focal abnormality. Adrenals/Urinary Tract: No adrenal lesions. Right-sided hydronephrosis with a proximal ureteral 3 mm stone. Unremarkable urinary bladder. Stomach/Bowel: Stomach is within normal limits. Appendix appears normal. No evidence of bowel wall thickening, distention, or inflammatory changes. Diverticulosis descending and sigmoid. Vascular/Lymphatic: Aortic atherosclerosis. No enlarged abdominal or pelvic lymph nodes. Reproductive: Status post hysterectomy. No adnexal masses. Other: No free air or fluid. Anterior abdominal wall defect containing omental fat. Musculoskeletal: No acute or significant osseous findings. IMPRESSION: 1. Right-sided hydronephrosis with a proximal ureteral 3 mm stone. 2. Diverticulosis. 3. Anterior abdominal wall defect containing omental fat. 4. Aortic atherosclerosis (ICD10-I70.0). Electronically Signed   By: Layla Maw M.D.   On: 01/05/2023 20:01    Procedures Procedures    Medications Ordered in ED Medications  morphine (PF) 4 MG/ML injection 4 mg (4 mg Intravenous Given 01/05/23 1612)  ondansetron (ZOFRAN) injection  4 mg (4 mg Intravenous Given 01/05/23 1609)  ketorolac (TORADOL) 15 MG/ML injection 15 mg (15 mg Intravenous Given 01/05/23 1613)  morphine (PF) 4 MG/ML injection 4 mg (4 mg Intravenous Given 01/05/23 2037)    ED Course/ Medical Decision Making/ A&P Clinical Course as of 01/06/23 1013  Thu Jan 05, 2023  1711 CT renal showing significant enlargement of right kidney with probable hydro and proximal stone.  Awaiting radiology reading. [MB]  1610 Patient much more comfortable after pain medication.  Workup has been fairly unremarkable other than slightly elevated lipase.  Awaiting results of CT. [MB]  2009 Reviewed results of lab work urinalysis and imaging with patient and her husband.  They are comfortable plan for discharge and outpatient follow-up with urology.  Return instructions discussed [MB]    Clinical Course User Index [MB] Terrilee Files, MD                                 Medical Decision Making Amount and/or Complexity of Data Reviewed Labs: ordered. Radiology: ordered.  Risk Prescription drug management.   This patient complains of right flank into right lower quadrant pain; this involves an extensive number of treatment Options and is a complaint that carries with it a high risk of complications and morbidity. The differential includes biliary colic, pyelonephritis, appendicitis, renal colic, obstruction, perforation  I ordered, reviewed and interpreted labs, which included CBC with normal white count, hemoglobin slightly down from priors, chemistries and LFTs normal, urinalysis without signs of infection, lipase mildly elevated I ordered medication IV pain medication nausea medication and reviewed PMP when indicated. I ordered imaging studies which included CT renal and I independently    visualized and interpreted imaging which showed mid ureteral stone on right with hydro- Additional history obtained from patient's husband Previous records obtained and reviewed in  epic including prior oncology and PCP notes Cardiac monitoring reviewed, sinus rhythm Social determinants considered, no significant barriers Critical Interventions: None  After the interventions stated above, I reevaluated the patient and found patient's pain to be much improved Admission and further testing considered, no indications for admission.  We discussed symptomatic treatment I sent prescriptions for pain medication and Flomax to pharmacy.  Given contact information for outpatient urology.  Return instructions discussed         Final Clinical Impression(s) / ED Diagnoses Final diagnoses:  Ureteral stone with hydronephrosis  Rx / DC Orders ED Discharge Orders          Ordered    oxyCODONE-acetaminophen (PERCOCET/ROXICET) 5-325 MG tablet  Every 6 hours PRN        01/05/23 2012    tamsulosin (FLOMAX) 0.4 MG CAPS capsule  Daily        01/05/23 2012              Terrilee Files, MD 01/06/23 1015

## 2023-01-05 NOTE — ED Triage Notes (Signed)
Right lower abdominal pain with radiation to right lower back.  No pain or burning with urination.  Pt denies any nausea or vomiting.  Pt had last BM today (x2)

## 2023-01-05 NOTE — ED Notes (Signed)
 Discharge paperwork reviewed entirely with patient, including follow up care. Pain was under control. The patient received instruction and coaching on their prescriptions, and all follow-up questions were answered.  Pt verbalized understanding as well as all parties involved. No questions or concerns voiced at the time of discharge. No acute distress noted.   Pt was wheeled out to the PVA in a wheelchair without incident.  Pt advised they will notify their PCP immediately.

## 2023-01-09 DIAGNOSIS — C50912 Malignant neoplasm of unspecified site of left female breast: Secondary | ICD-10-CM | POA: Diagnosis not present

## 2023-01-13 ENCOUNTER — Telehealth: Payer: Self-pay

## 2023-01-13 NOTE — Telephone Encounter (Signed)
Called pt per MD to advise [facility] reveal testing was negative/not detected. Pt verbalized understanding of results and knows [facility] will be in touch to schedule 6 mo repeat lab.

## 2023-01-16 ENCOUNTER — Encounter: Payer: Self-pay | Admitting: Hematology and Oncology

## 2023-01-16 ENCOUNTER — Other Ambulatory Visit: Payer: Self-pay | Admitting: Internal Medicine

## 2023-01-17 ENCOUNTER — Encounter (HOSPITAL_COMMUNITY): Payer: Self-pay

## 2023-01-17 ENCOUNTER — Emergency Department (HOSPITAL_COMMUNITY)
Admission: EM | Admit: 2023-01-17 | Discharge: 2023-01-18 | Disposition: A | Discharge status: Home / Self Care | Payer: Medicare Other | Attending: Emergency Medicine | Admitting: Emergency Medicine

## 2023-01-17 ENCOUNTER — Other Ambulatory Visit: Payer: Self-pay

## 2023-01-17 ENCOUNTER — Emergency Department (HOSPITAL_COMMUNITY): Payer: Medicare Other

## 2023-01-17 DIAGNOSIS — Z794 Long term (current) use of insulin: Secondary | ICD-10-CM | POA: Diagnosis not present

## 2023-01-17 DIAGNOSIS — N201 Calculus of ureter: Secondary | ICD-10-CM | POA: Diagnosis not present

## 2023-01-17 DIAGNOSIS — R109 Unspecified abdominal pain: Secondary | ICD-10-CM | POA: Diagnosis not present

## 2023-01-17 DIAGNOSIS — Z7982 Long term (current) use of aspirin: Secondary | ICD-10-CM | POA: Insufficient documentation

## 2023-01-17 DIAGNOSIS — K5790 Diverticulosis of intestine, part unspecified, without perforation or abscess without bleeding: Secondary | ICD-10-CM | POA: Diagnosis not present

## 2023-01-17 LAB — COMPREHENSIVE METABOLIC PANEL
ALT: 15 U/L (ref 0–44)
AST: 18 U/L (ref 15–41)
Albumin: 4.2 g/dL (ref 3.5–5.0)
Alkaline Phosphatase: 55 U/L (ref 38–126)
Anion gap: 11 (ref 5–15)
BUN: 21 mg/dL (ref 8–23)
CO2: 23 mmol/L (ref 22–32)
Calcium: 9.8 mg/dL (ref 8.9–10.3)
Chloride: 102 mmol/L (ref 98–111)
Creatinine, Ser: 1.46 mg/dL — ABNORMAL HIGH (ref 0.44–1.00)
GFR, Estimated: 39 mL/min — ABNORMAL LOW (ref >60)
Glucose, Bld: 223 mg/dL — ABNORMAL HIGH (ref 70–99)
Potassium: 3.4 mmol/L — ABNORMAL LOW (ref 3.5–5.1)
Sodium: 136 mmol/L (ref 135–145)
Total Bilirubin: 1.1 mg/dL (ref 0.0–1.2)
Total Protein: 7.5 g/dL (ref 6.5–8.1)

## 2023-01-17 LAB — CBC WITH DIFFERENTIAL/PLATELET
Abs Immature Granulocytes: 0.01 10*3/uL (ref 0.00–0.07)
Basophils Absolute: 0 10*3/uL (ref 0.0–0.1)
Basophils Relative: 1 %
Eosinophils Absolute: 0 10*3/uL (ref 0.0–0.5)
Eosinophils Relative: 0 %
HCT: 35.9 % — ABNORMAL LOW (ref 36.0–46.0)
Hemoglobin: 12.3 g/dL (ref 12.0–15.0)
Immature Granulocytes: 0 %
Lymphocytes Relative: 27 %
Lymphs Abs: 1.4 10*3/uL (ref 0.7–4.0)
MCH: 30.4 pg (ref 26.0–34.0)
MCHC: 34.3 g/dL (ref 30.0–36.0)
MCV: 88.6 fL (ref 80.0–100.0)
Monocytes Absolute: 0.4 10*3/uL (ref 0.1–1.0)
Monocytes Relative: 7 %
Neutro Abs: 3.4 10*3/uL (ref 1.7–7.7)
Neutrophils Relative %: 65 %
Platelets: 247 10*3/uL (ref 150–400)
RBC: 4.05 MIL/uL (ref 3.87–5.11)
RDW: 13.3 % (ref 11.5–15.5)
WBC: 5.2 10*3/uL (ref 4.0–10.5)
nRBC: 0 % (ref 0.0–0.2)

## 2023-01-17 NOTE — ED Provider Triage Note (Signed)
 Emergency Medicine Provider Triage Evaluation Note  Carrie Singh , a 67 y.o. female  was evaluated in triage.  Pt complains of feeling weak, jittery, and pain from known kidney stone.  Patient was seen on 01/05/23 and diagnosed with a kidney stone.  She has been taking prescribed pain medication.  She reports she has been laid up in the bed for the past 2 days.  She notices a small amount of blood when she urinates.  Her pain is on the right side.   Review of Systems  Positive: As above Negative: As above  Physical Exam  BP 109/70 (BP Location: Left Arm)   Pulse (!) 102   Temp 98.7 F (37.1 C) (Oral)   Resp 17   Ht 5' 5 (1.651 m)   Wt 94.3 kg   SpO2 99%   BMI 34.61 kg/m  Gen:   Awake, no distress   Resp:  Normal effort  MSK:   Moves extremities without difficulty  Other:    Medical Decision Making  Medically screening exam initiated at 8:45 PM.  Appropriate orders placed.  TRACE CEDERBERG was informed that the remainder of the evaluation will be completed by another provider, this initial triage assessment does not replace that evaluation, and the importance of remaining in the ED until their evaluation is complete.     Gretta Gerard SAUNDERS, [place] 01/17/23 2047

## 2023-01-17 NOTE — ED Triage Notes (Signed)
Pt to ED by POV from home with c/o previously diagnosed R sided kidney stone. Pt was seen here on 12/19 and diagnosed with a 3mm stone at that time. Since then pt has been experiencing body aches, chills and discomfort, rates pain 8/10. Arrives A+O, VSS.

## 2023-01-18 LAB — URINALYSIS, W/ REFLEX TO CULTURE (INFECTION SUSPECTED)
Bilirubin Urine: NEGATIVE
Glucose, UA: NEGATIVE mg/dL
Hgb urine dipstick: NEGATIVE
Ketones, ur: 5 mg/dL — AB
Nitrite: NEGATIVE
Protein, ur: 100 mg/dL — AB
Specific Gravity, Urine: 1.02 (ref 1.005–1.030)
pH: 5 (ref 5.0–8.0)

## 2023-01-18 NOTE — ED Provider Notes (Signed)
 [facility] EMERGENCY DEPARTMENT AT [facility] Provider Note   CSN: [id number] Arrival date & time: 01/17/23  2019     History  Chief Complaint  Patient presents with   Flank Pain    Carrie Singh is a 68 y.o. female.  Patient with history of hysterectomy, recently diagnosed with kidney stone on the right side -presents to the emergency department today for evaluation of intermittent ongoing right-sided flank pain.  She was seen in the emergency department on 12/19 and diagnosed with right-sided ureteral stone.  Her symptoms have continued.  She has not felt that she has passed a stone.  No fevers, nausea or vomiting.  No dysuria, hematuria, or increased frequency or urgency.  She states that she was at church and spoke with a friend who is a development worker, community.  They recommended that she come to the emergency department to make sure she did not have a kidney infection with the kidney stone.  She reports dry cough and some chills without fever recently.       Home Medications Prior to Admission medications   Medication Sig Start Date End Date Taking? Authorizing Provider  amLODipine  (NORVASC ) 10 MG tablet Take 1 tablet (10 mg total) by mouth daily. 08/15/22   Antonette Angeline ORN, NP  aspirin  EC 81 MG tablet Take 1 tablet (81 mg total) by mouth daily. Swallow whole. 01/18/22   Antonette Angeline ORN, NP  Biotin w/ Vitamins C & E (HAIR SKIN & NAILS GUMMIES PO) Take by mouth.    [provider]  calcium carbonate (OSCAL) 1500 (600 Ca) MG TABS tablet Take by mouth 2 (two) times daily with a meal.    [provider]  carvedilol  (COREG ) 12.5 MG tablet Take 12.5 mg by mouth in the morning and at bedtime. 04/30/19   [provider]  fluticasone  (FLONASE ) 50 MCG/ACT nasal spray Place 2 sprays into both nostrils daily for 7 days. 09/27/22 10/04/22  LongFonda MATSU, MD  hydrALAZINE  (APRESOLINE ) 100 MG tablet TAKE 1 TABLET BY MOUTH 3 TIMES  DAILY 11/09/22   Antonette Angeline ORN, NP   irbesartan  (AVAPRO ) 300 MG tablet TAKE 1 TABLET BY MOUTH DAILY 12/05/22   Antonette Angeline ORN, NP  Lancets (ONETOUCH DELICA PLUS [place]) MISC CHECK BLOOD SUGAR TWICE DAILY 05/03/22   Antonette Angeline ORN, NP  letrozole  (FEMARA ) 2.5 MG tablet Take 1 tablet (2.5 mg total) by mouth daily. 12/29/22   Odean Potts, MD  lidocaine  (LIDODERM ) 5 % Place 1 patch onto the skin daily. Remove & Discard patch within 12 hours or as directed by MD 09/13/22   Theadore Ozell HERO, MD  metFORMIN  (GLUCOPHAGE ) 1000 MG tablet Take 1,000 mg by mouth 2 (two) times daily with a meal.    [provider]  Multiple Vitamins-Minerals (ONE-A-DAY WOMENS VITACRAVES) CHEW Chew 1 tablet by mouth daily.     [provider]  naproxen  (NAPROSYN ) 250 MG tablet Take 2 tablets (500 mg total) by mouth 2 (two) times daily with a meal. 09/13/22   Theadore Ozell HERO, MD  [facility] ULTRA test strip USE TO CHECK BLOOD SUGAR TWICE  DAILY AS DIRECTED 05/02/22   Antonette Angeline ORN, NP  oxyCODONE -acetaminophen  (PERCOCET/ROXICET) 5-325 MG tablet Take 1 tablet by mouth every 6 (six) hours as needed for severe pain (pain score 7-10). 01/05/23   Towana Ozell BROCKS, MD  pravastatin  (PRAVACHOL ) 40 MG tablet TAKE 1 TABLET BY MOUTH DAILY 11/09/22   Antonette Angeline ORN, NP  repaglinide  (PRANDIN ) 0.5  MG tablet Take 0.5 mg by mouth 3 (three) times daily before meals.    [provider]  tamsulosin  (FLOMAX ) 0.4 MG CAPS capsule Take 1 capsule (0.4 mg total) by mouth daily. 01/05/23   Towana Ozell BROCKS, MD      Allergies    Patient has no known allergies.    Review of Systems   Review of Systems  Physical Exam Updated Vital Signs BP (!) 138/96   Pulse 66   Temp 98.2 F (36.8 C) (Oral)   Resp 17   Ht 5' 5 (1.651 m)   Wt 94.3 kg   SpO2 95%   BMI 34.61 kg/m   Physical Exam Vitals and nursing note reviewed.  Constitutional:      General: She is not in acute distress.    Appearance: She is well-developed.  HENT:     Head: Normocephalic and  atraumatic.     Right Ear: External ear normal.     Left Ear: External ear normal.     Nose: Nose normal.  Eyes:     Conjunctiva/sclera: Conjunctivae normal.  Cardiovascular:     Rate and Rhythm: Normal rate and regular rhythm.     Heart sounds: No murmur heard. Pulmonary:     Effort: No respiratory distress.     Breath sounds: No wheezing, rhonchi or rales.  Abdominal:     General: There is no distension.     Palpations: Abdomen is soft.     Tenderness: There is abdominal tenderness. There is right CVA tenderness (Minimal). There is no left CVA tenderness, guarding or rebound.     Comments: Patient with mild right lateral abdominal tenderness without rebound or guarding.  Musculoskeletal:     Cervical back: Normal range of motion and neck supple.     Right lower leg: No edema.     Left lower leg: No edema.  Skin:    General: Skin is warm and dry.     Findings: No rash.  Neurological:     General: No focal deficit present.     Mental Status: She is alert. Mental status is at baseline.     Motor: No weakness.  Psychiatric:        Mood and Affect: Mood normal.     ED Results / Procedures / Treatments   Labs (all labs ordered are listed, but only abnormal results are displayed) Labs Reviewed  COMPREHENSIVE METABOLIC PANEL - Abnormal; Notable for the following components:      Result Value   Potassium 3.4 (*)    Glucose, Bld 223 (*)    Creatinine, Ser 1.46 (*)    GFR, Estimated 39 (*)    All other components within normal limits  CBC WITH DIFFERENTIAL/PLATELET - Abnormal; Notable for the following components:   HCT 35.9 (*)    All other components within normal limits  URINALYSIS, W/ REFLEX TO CULTURE (INFECTION SUSPECTED) - Abnormal; Notable for the following components:   APPearance HAZY (*)    Ketones, ur 5 (*)    Protein, ur 100 (*)    Leukocytes,Ua SMALL (*)    Bacteria, UA RARE (*)    All other components within normal limits    EKG None  Radiology CT  Renal Stone Study Result Date: 01/17/2023 CLINICAL DATA:  History of right-sided renal stone with increasing pain, initial encounter EXAM: CT ABDOMEN AND PELVIS WITHOUT CONTRAST TECHNIQUE: Multidetector CT imaging of the abdomen and pelvis was performed following the standard protocol without IV contrast.  RADIATION DOSE REDUCTION: This exam was performed according to the departmental dose-optimization program which includes automated exposure control, adjustment of the mA and/or kV according to patient size and/or use of iterative reconstruction technique. COMPARISON:  01/05/2023 FINDINGS: Lower chest: No acute abnormality. Hepatobiliary: No focal liver abnormality is seen. No gallstones, gallbladder wall thickening, or biliary dilatation. Pancreas: Unremarkable. No pancreatic ductal dilatation or surrounding inflammatory changes. Spleen: Normal in size without focal abnormality. Adrenals/Urinary Tract: Adrenal glands are within normal limits. Kidneys demonstrate no renal calculi. Resolution of previously seen hydronephrosis on the right is noted. The previously seen tiny ureteral stone is not well appreciated on today's exam. The bladder is decompressed. Stomach/Bowel: No obstructive or inflammatory changes of the colon are seen. Scattered diverticular changes noted without evidence of diverticulitis. Appendix is within normal limits. Small bowel and stomach are unremarkable. Vascular/Lymphatic: Aortic atherosclerosis. No enlarged abdominal or pelvic lymph nodes. Reproductive: Status post hysterectomy. No adnexal masses. Other: No abdominal wall hernia or abnormality. No abdominopelvic ascites. Musculoskeletal: No acute or significant osseous findings. IMPRESSION: Resolution of previously seen hydronephrosis on the right. Previously noted ureteral stone has passed in the interval. Diverticulosis without diverticulitis. Electronically Signed   By: Oneil Devonshire M.D.   On: 01/17/2023 23:52     Procedures Procedures    Medications Ordered in ED Medications - No data to display  ED Course/ Medical Decision Making/ A&P    Patient seen and examined. History obtained directly from patient. Work-up including labs, imaging, EKG ordered in triage, if performed, were reviewed.    Labs/EKG: Independently reviewed and interpreted.  This included: CBC with normal white blood cell count and hemoglobin; CMP with minimally low potassium at 3.4, glucose mildly elevated at 223 with normal anion gap, creatinine minimally elevated from baseline at 1.46 with a normal BUN.   UA pending.  Imaging: Independently visualized and interpreted.  This included: CT renal stone study, agree resolution of right-sided hydronephrosis and no stone, no other obvious inflammatory process.  Medications/Fluids: Offered medication for flank pain, patient declines as pain is currently improved.  Most recent vital signs reviewed and are as follows: BP (!) 138/96   Pulse 66   Temp 98.2 F (36.8 C) (Oral)   Resp 17   Ht 5' 5 (1.651 m)   Wt 94.3 kg   SpO2 95%   BMI 34.61 kg/m   Initial impression: Right-sided flank pain.  Will need to rule out UTI, awaiting UA.  Patient reports having a recent nonproductive cough.  We did discuss checking a flu and COVID test, however patient declines.  She does report having had flu and COVID vaccines.  We also discussed chest x-ray but overall low concern for pneumonia and patient defers currently.  No fever here.  8:20 AM Reassessment performed. Patient appears stable, comfortable.  Labs personally reviewed and interpreted including: UA without compelling signs of infection.  Reviewed pertinent lab work and imaging with patient at bedside. Questions answered.   Most current vital signs reviewed and are as follows: BP (!) 138/96   Pulse 66   Temp 98.2 F (36.8 C) (Oral)   Resp 17   Ht 5' 5 (1.651 m)   Wt 94.3 kg   SpO2 95%   BMI 34.61 kg/m   Plan:  Discharge to home.   Prescriptions written for: None  Other home care instructions discussed: Continue over the counter medications for discomfort, rest and hydration  ED return instructions discussed: The patient was urged to return to the  Emergency Department immediately with worsening of current symptoms, worsening abdominal pain, persistent vomiting, blood noted in stools, fever, or any other concerns. The patient verbalized understanding.    Follow-up instructions discussed: Patient encouraged to follow-up with their PCP in 3 days.  She does report having an appointment scheduled for tomorrow which she will keep.                                  Medical Decision Making  For this patient's complaint of abdominal pain, the following conditions were considered on the differential diagnosis: gastritis/PUD, enteritis/duodenitis, appendicitis, cholelithiasis/cholecystitis, cholangitis, pancreatitis, ruptured viscus, colitis, diverticulitis, small/large bowel obstruction, proctitis, cystitis, pyelonephritis, ureteral colic, aortic dissection, aortic aneurysm. In women, pelvic inflammatory disease, ovarian cysts, and tubo-ovarian abscess were also considered. Atypical chest etiologies were also considered including ACS, PE, and pneumonia.  The patient's vital signs, pertinent lab work and imaging were reviewed and interpreted as discussed in the ED course. Hospitalization was considered for further testing, treatments, or serial exams/observation. However as patient is well-appearing, has a stable exam, and reassuring studies today, I do not feel that they warrant admission at this time. This plan was discussed with the patient who verbalizes agreement and comfort with this plan and seems reliable and able to return to the Emergency Department with worsening or changing symptoms.          Final Clinical Impression(s) / ED Diagnoses Final diagnoses:  Flank pain    Rx / DC Orders ED  Discharge Orders     None         Desiderio Chew, PA-C 01/18/23 [id number]    Levander Houston, MD 01/23/23 1733

## 2023-01-18 NOTE — Discharge Instructions (Signed)
 Please read and follow all provided instructions.  Your diagnoses today include:  1. Flank pain     Tests performed today include: Blood cell counts and platelets: Normal white blood cell counts and red blood cells Kidney and liver function tests: Kidney function test was slightly higher than before, please continue to drink plenty of fluids and have this rechecked by your doctor in the next couple of weeks Urine test to look for infection: Does not show a definite infection CT scan of your abdomen and pelvis shows that the kidney stone on the right side has passed, no other emergent findings today Vital signs. See below for your results today.   Medications prescribed:  Please use over-the-counter NSAID medications (ibuprofen , naproxen ) or Tylenol  (acetaminophen ) as directed on the packaging for pain -- as long as you do not have any reasons avoid these medications. Reasons to avoid NSAID medications include: weak kidneys, a history of bleeding in your stomach or gut, or uncontrolled high blood pressure or previous heart attack. Reasons to avoid Tylenol  include: liver problems or ongoing alcohol use. Never take more than 4000mg  or 8 Extra strength Tylenol  in a 24 hour period.     Take any prescribed medications only as directed.  Home care instructions:  Follow any educational materials contained in this packet.  Follow-up instructions: Please follow-up with your primary care provider in the next 2 days for further evaluation of your symptoms.    Return instructions:  SEEK IMMEDIATE MEDICAL ATTENTION IF: The pain does not go away or becomes severe  A temperature above 101F develops  Repeated vomiting occurs (multiple episodes)  The pain becomes localized to portions of the abdomen. The right side could possibly be appendicitis. In an adult, the left lower portion of the abdomen could be colitis or diverticulitis.  Blood is being passed in stools or vomit (bright red or black tarry  stools)  You develop chest pain, difficulty breathing, dizziness or fainting, or become confused, poorly responsive, or inconsolable (young children) If you have any other emergent concerns regarding your health  Additional Information: Abdominal (belly) pain can be caused by many things. Your caregiver performed an examination and possibly ordered blood/urine tests and imaging (CT scan, x-rays, ultrasound). Many cases can be observed and treated at home after initial evaluation in the emergency department. Even though you are being discharged home, abdominal pain can be unpredictable. Therefore, you need a repeated exam if your pain does not resolve, returns, or worsens. Most patients with abdominal pain don't have to be admitted to the hospital or have surgery, but serious problems like appendicitis and gallbladder attacks can start out as nonspecific pain. Many abdominal conditions cannot be diagnosed in one visit, so follow-up evaluations are very important.  Your vital signs today were: BP (!) 138/96   Pulse 66   Temp 98.2 F (36.8 C) (Oral)   Resp 17   Ht 5' 5 (1.651 m)   Wt 94.3 kg   SpO2 95%   BMI 34.61 kg/m  If your blood pressure (bp) was elevated above 135/85 this visit, please have this repeated by your doctor within one month. --------------

## 2023-01-19 ENCOUNTER — Encounter: Payer: Self-pay | Admitting: Internal Medicine

## 2023-01-19 ENCOUNTER — Ambulatory Visit (INDEPENDENT_AMBULATORY_CARE_PROVIDER_SITE_OTHER): Payer: Medicare Other | Admitting: Internal Medicine

## 2023-01-19 VITALS — BP 118/68 | Ht 65.0 in | Wt 206.2 lb

## 2023-01-19 DIAGNOSIS — Z6834 Body mass index (BMI) 34.0-34.9, adult: Secondary | ICD-10-CM

## 2023-01-19 DIAGNOSIS — Z0001 Encounter for general adult medical examination with abnormal findings: Secondary | ICD-10-CM

## 2023-01-19 DIAGNOSIS — E66811 Obesity, class 1: Secondary | ICD-10-CM

## 2023-01-19 DIAGNOSIS — E1165 Type 2 diabetes mellitus with hyperglycemia: Secondary | ICD-10-CM

## 2023-01-19 DIAGNOSIS — E6609 Other obesity due to excess calories: Secondary | ICD-10-CM

## 2023-01-19 LAB — POCT GLYCOSYLATED HEMOGLOBIN (HGB A1C): Hemoglobin A1C: 5.6 % (ref 4.0–5.6)

## 2023-01-19 NOTE — Assessment & Plan Note (Signed)
 Encourage diet and exercise for weight loss

## 2023-01-19 NOTE — Progress Notes (Signed)
 Subjective:    Patient ID: Carrie Singh, female    DOB: 26-Feb-1955, 68 y.o.   MRN: [id number]  HPI  Patient presents to clinic today for her annual exam.  Flu: 09/2022 Tetanus: 12/2008 COVID: Pfizer x 4 Pneumovax: 12/2019 Prevnar: 11/2018 Shingrix : x 2 Pap smear: Hysterectomy Mammogram: 01/2021, double mastectomy Bone density: 12/2020 Colon screening: 03/2021 Vision screening: annually Dentist: biannually  Diet: She does eat meat. She consumes fruits and veggies. She does eat fried foods. She drinks mostly water. Exercise: None  Review of Systems     Past Medical History:  Diagnosis Date   Breast cancer (HCC) 01/2021   left breast IDC   Breast cancer, right breast (HCC) 12/2007   a. s/p chemo, radiation, and lumpectomy with negative sentinel lymph node biopsy    Complication of anesthesia    woke up during colonoscopy   Depression    Diabetes mellitus without complication (HCC)    GERD (gastroesophageal reflux disease)    Hyperlipidemia    Hypertension    resistant, negative renal Dopplers and negative CT angiogram   Migraines    Obesity     Current Outpatient Medications  Medication Sig Dispense Refill   amLODipine  (NORVASC ) 10 MG tablet Take 1 tablet (10 mg total) by mouth daily. 90 tablet 1   aspirin  EC 81 MG tablet Take 1 tablet (81 mg total) by mouth daily. Swallow whole. 30 tablet 12   Biotin w/ Vitamins C & E (HAIR SKIN & NAILS GUMMIES PO) Take by mouth.     calcium carbonate (OSCAL) 1500 (600 Ca) MG TABS tablet Take by mouth 2 (two) times daily with a meal.     carvedilol  (COREG ) 12.5 MG tablet Take 12.5 mg by mouth in the morning and at bedtime.     fluticasone  (FLONASE ) 50 MCG/ACT nasal spray Place 2 sprays into both nostrils daily for 7 days. 1 g 0   hydrALAZINE  (APRESOLINE ) 100 MG tablet TAKE 1 TABLET BY MOUTH 3 TIMES  DAILY 300 tablet 1   irbesartan  (AVAPRO ) 300 MG tablet TAKE 1 TABLET BY MOUTH DAILY 90 tablet 0   Lancets (ONETOUCH DELICA PLUS  LANCET33G) MISC CHECK BLOOD SUGAR TWICE DAILY 200 each 2   letrozole  (FEMARA ) 2.5 MG tablet Take 1 tablet (2.5 mg total) by mouth daily. 90 tablet 3   lidocaine  (LIDODERM ) 5 % Place 1 patch onto the skin daily. Remove & Discard patch within 12 hours or as directed by MD 5 patch 0   metFORMIN  (GLUCOPHAGE ) 1000 MG tablet Take 1,000 mg by mouth 2 (two) times daily with a meal.     Multiple Vitamins-Minerals (ONE-A-DAY WOMENS VITACRAVES) CHEW Chew 1 tablet by mouth daily.      naproxen  (NAPROSYN ) 250 MG tablet Take 2 tablets (500 mg total) by mouth 2 (two) times daily with a meal. 30 tablet 0   ONETOUCH ULTRA test strip USE TO CHECK BLOOD SUGAR TWICE  DAILY AS DIRECTED 200 strip 2   oxyCODONE -acetaminophen  (PERCOCET/ROXICET) 5-325 MG tablet Take 1 tablet by mouth every 6 (six) hours as needed for severe pain (pain score 7-10). 15 tablet 0   pravastatin  (PRAVACHOL ) 40 MG tablet TAKE 1 TABLET BY MOUTH DAILY 100 tablet 1   repaglinide  (PRANDIN ) 0.5 MG tablet Take 0.5 mg by mouth 3 (three) times daily before meals.     tamsulosin  (FLOMAX ) 0.4 MG CAPS capsule Take 1 capsule (0.4 mg total) by mouth daily. 30 capsule 0   No current facility-administered medications for  this visit.    No Known Allergies  Family History  Problem Relation Age of Onset   Stroke Mother    Colon cancer Father 18   Heart disease Father    Asthma Father    Lung cancer Sister    Colon cancer Brother 64   Diabetes Brother    Breast cancer Niece        dx. <50   Breast cancer Niece        dx. <50   Breast cancer Niece        dx. >50   Esophageal cancer Neg Hx    Rectal cancer Neg Hx    Stomach cancer Neg Hx     Social History   Socioeconomic History   Marital status: Married    Spouse name: Carrie Singh   Number of children: 3   Years of education: 12+   Highest education level: Some college, no degree  Occupational History   Occupation: Disabled  Tobacco Use   Smoking status: Never   Smokeless tobacco:  Never  Vaping Use   Vaping status: Never Used  Substance and Sexual Activity   Alcohol use: No    Alcohol/week: 0.0 standard drinks of alcohol   Drug use: No   Sexual activity: Yes  Other Topics Concern   Not on file  Social History Narrative   Lives at home with husband.   Caffeine  use: 1-2 drinks per month         Epworth Sleepiness Scale = 15 (as of 01/01/15)   Social Drivers of Health   Financial Resource Strain: Low Risk  (07/29/2022)   Overall Financial Resource Strain (CARDIA)    Difficulty of Paying Living Expenses: Not hard at all  Food Insecurity: No Food Insecurity (07/29/2022)   Hunger Vital Sign    Worried About Running Out of Food in the Last Year: Never true    Ran Out of Food in the Last Year: Never true  Transportation Needs: No Transportation Needs (07/29/2022)   PRAPARE - Administrator, Civil Service (Medical): No    Lack of Transportation (Non-Medical): No  Physical Activity: Insufficiently Active (07/29/2022)   Exercise Vital Sign    Days of Exercise per Week: 2 days    Minutes of Exercise per Session: 20 min  Stress: No Stress Concern Present (07/29/2022)   [employer] of Occupational Health - Occupational Stress Questionnaire    Feeling of Stress : Not at all  Social Connections: Socially Integrated (07/29/2022)   Social Connection and Isolation Panel [NHANES]    Frequency of Communication with Friends and Family: More than three times a week    Frequency of Social Gatherings with Friends and Family: More than three times a week    Attends Religious Services: More than 4 times per year    Active Member of [employer] or Organizations: Yes    Attends Banker Meetings: More than 4 times per year    Marital Status: Married  Catering Manager Violence: Not At Risk (07/29/2022)   Humiliation, Afraid, Rape, and Kick questionnaire    Fear of Current or Ex-Partner: No    Emotionally Abused: No    Physically Abused: No    Sexually  Abused: No     Constitutional: Patient reports rare headaches.  Denies fever, malaise, fatigue, or abrupt weight changes.  HEENT: Denies eye pain, eye redness, ear pain, ringing in the ears, wax buildup, runny nose, nasal congestion, bloody nose, or sore throat.  Respiratory: Denies difficulty breathing, shortness of breath, cough or sputum production.   Cardiovascular: Denies chest pain, chest tightness, palpitations or swelling in the hands or feet.  Gastrointestinal: Denies abdominal pain, bloating, constipation, diarrhea or blood in the stool.  GU: Patient reports urge incontinence.  Denies urgency, frequency, pain with urination, burning sensation, blood in urine, odor or discharge. Musculoskeletal: Patient reports joint pain, difficulty with gait.  Denies decrease in range of motion, muscle pain or joint swelling.  Skin: Denies redness, rashes, lesions or ulcercations.  Neurological: Denies dizziness, difficulty with memory, difficulty with speech or problems with balance and coordination.  Psych: Patient has a history of depression.  Denies anxiety, SI/HI.  No other specific complaints in a complete review of systems (except as listed in HPI above).  Objective:   Physical Exam BP 118/68 (BP Location: Left Arm, Patient Position: Sitting, Cuff Size: Large)   Ht 5' 5 (1.651 m)   Wt 206 lb 3.2 oz (93.5 kg)   BMI 34.31 kg/m    Wt Readings from Last 3 Encounters:  01/17/23 208 lb (94.3 kg)  12/29/22 207 lb 14.4 oz (94.3 kg)  09/29/22 210 lb (95.3 kg)    General: Appears her stated age, obese, in NAD. Skin: Warm, dry and intact. No  ulcerations noted. HEENT: Head: normal shape and size; Eyes: sclera white, no icterus, conjunctiva pink, PERRLA and EOMs intact;  Neck:  Neck supple, trachea midline. No masses, lumps or thyromegaly present.  Cardiovascular: Normal rate and rhythm. S1,S2 noted.  No murmur, rubs or gallops noted. No JVD or BLE edema. No carotid bruits  noted. Pulmonary/Chest: Normal effort and positive vesicular breath sounds. No respiratory distress. No wheezes, rales or ronchi noted.  Abdomen: Normal bowel sounds.  Musculoskeletal: Strength 5/5 BUE/BLE.  Gait slow and steady without device. Neurological: Alert and oriented. Cranial nerves II-XII grossly intact. Coordination normal.  Psychiatric: Mood and affect normal. Behavior is normal. Judgment and thought content normal.    BMET    Component Value Date/Time   NA 136 01/17/2023 2119   NA 146 (H) 01/20/2015 1457   NA 143 10/12/2013 1510   K 3.4 (L) 01/17/2023 2119   K 3.9 10/12/2013 1510   CL 102 01/17/2023 2119   CL 109 (H) 10/12/2013 1510   CO2 23 01/17/2023 2119   CO2 29 10/12/2013 1510   GLUCOSE 223 (H) 01/17/2023 2119   GLUCOSE 92 10/12/2013 1510   BUN 21 01/17/2023 2119   BUN 17 01/20/2015 1457   BUN 16 10/12/2013 1510   CREATININE 1.46 (H) 01/17/2023 2119   CREATININE 1.16 (H) 05/03/2022 0932   CALCIUM 9.8 01/17/2023 2119   CALCIUM 9.4 10/12/2013 1510   GFRNONAA 39 (L) 01/17/2023 2119   GFRNONAA 56 (L) 10/12/2013 1510   GFRAA >60 01/24/2018 2159   GFRAA >60 10/12/2013 1510    Lipid Panel     Component Value Date/Time   CHOL 170 01/18/2022 0924   CHOL 179 10/17/2014 1527   TRIG 79 01/18/2022 0924   HDL 75 01/18/2022 0924   HDL 59 10/17/2014 1527   CHOLHDL 2.3 01/18/2022 0924   VLDL 15.2 01/09/2020 0957   LDLCALC 79 01/18/2022 0924    CBC    Component Value Date/Time   WBC 5.2 01/17/2023 2119   RBC 4.05 01/17/2023 2119   HGB 12.3 01/17/2023 2119   HGB 13.5 10/12/2013 1510   HGB 12.8 08/13/2009 0946   HCT 35.9 (L) 01/17/2023 2119   HCT 40.5 10/12/2013 1510  HCT 36.8 08/13/2009 0946   PLT 247 01/17/2023 2119   PLT 278 10/12/2013 1510   PLT 271 08/13/2009 0946   MCV 88.6 01/17/2023 2119   MCV 87 10/12/2013 1510   MCV 87.0 08/13/2009 0946   MCH 30.4 01/17/2023 2119   MCHC 34.3 01/17/2023 2119   RDW 13.3 01/17/2023 2119   RDW 13.6 10/12/2013  1510   RDW 13.8 08/13/2009 0946   LYMPHSABS 1.4 01/17/2023 2119   LYMPHSABS 1.5 08/13/2009 0946   MONOABS 0.4 01/17/2023 2119   MONOABS 0.2 08/13/2009 0946   EOSABS 0.0 01/17/2023 2119   EOSABS 0.1 08/13/2009 0946   BASOSABS 0.0 01/17/2023 2119   BASOSABS 0.0 08/13/2009 0946    Hgb A1C Lab Results  Component Value Date   HGBA1C 6.0 (H) 05/03/2022            Assessment & Plan:   Preventative Health Maintenance:  Flu shot UTD She declines tetanus for financial reasons, advised if she gets bit or cut to go get this done at the pharmacy Encouraged her to get her COVID booster Pneumovax and Prevnar UTD Shingrix  vaccine UTD She no longer needs Pap smears Per oncology, she no longer needs mammograms due to bilateral mastectomy Bone density UTD Colon screening UTD Encouraged her to consume a balanced diet and exercise regimen Advised her to see an eye doctor and dentist annually  We will check CBC, c-Met, lipid, A1c and urine microalbumin today  RTC in 6 months, follow-up chronic conditions Angeline Laura, NP

## 2023-01-19 NOTE — Patient Instructions (Signed)
 Health Maintenance for Postmenopausal Women Menopause is a normal process in which your ability to get pregnant comes to an end. This process happens slowly over many months or years, usually between the ages of 76 and 38. Menopause is complete when you have missed your menstrual period for 12 months. It is important to talk with your health care provider about some of the most common conditions that affect women after menopause (postmenopausal women). These include heart disease, cancer, and bone loss (osteoporosis). Adopting a healthy lifestyle and getting preventive care can help to promote your health and wellness. The actions you take can also lower your chances of developing some of these common conditions. What are the signs and symptoms of menopause? During menopause, you may have the following symptoms: Hot flashes. These can be moderate or severe. Night sweats. Decrease in sex drive. Mood swings. Headaches. Tiredness (fatigue). Irritability. Memory problems. Problems falling asleep or staying asleep. Talk with your health care provider about treatment options for your symptoms. Do I need hormone replacement therapy? Hormone replacement therapy is effective in treating symptoms that are caused by menopause, such as hot flashes and night sweats. Hormone replacement carries certain risks, especially as you become older. If you are thinking about using estrogen or estrogen with progestin, discuss the benefits and risks with your health care provider. How can I reduce my risk for heart disease and stroke? The risk of heart disease, heart attack, and stroke increases as you age. One of the causes may be a change in the body's hormones during menopause. This can affect how your body uses dietary fats, triglycerides, and cholesterol. Heart attack and stroke are medical emergencies. There are many things that you can do to help prevent heart disease and stroke. Watch your blood pressure High  blood pressure causes heart disease and increases the risk of stroke. This is more likely to develop in people who have high blood pressure readings or are overweight. Have your blood pressure checked: Every 3-5 years if you are 32-23 years of age. Every year if you are 31 years old or older. Eat a healthy diet  Eat a diet that includes plenty of vegetables, fruits, low-fat dairy products, and lean protein. Do not eat a lot of foods that are high in solid fats, added sugars, or sodium. Get regular exercise Get regular exercise. This is one of the most important things you can do for your health. Most adults should: Try to exercise for at least 150 minutes each week. The exercise should increase your heart rate and make you sweat (moderate-intensity exercise). Try to do strengthening exercises at least twice each week. Do these in addition to the moderate-intensity exercise. Spend less time sitting. Even light physical activity can be beneficial. Other tips Work with your health care provider to achieve or maintain a healthy weight. Do not use any products that contain nicotine or tobacco. These products include cigarettes, chewing tobacco, and vaping devices, such as e-cigarettes. If you need help quitting, ask your health care provider. Know your numbers. Ask your health care provider to check your cholesterol and your blood sugar (glucose). Continue to have your blood tested as directed by your health care provider. Do I need screening for cancer? Depending on your health history and family history, you may need to have cancer screenings at different stages of your life. This may include screening for: Breast cancer. Cervical cancer. Lung cancer. Colorectal cancer. What is my risk for osteoporosis? After menopause, you may be  at increased risk for osteoporosis. Osteoporosis is a condition in which bone destruction happens more quickly than new bone creation. To help prevent osteoporosis or  the bone fractures that can happen because of osteoporosis, you may take the following actions: If you are 24-54 years old, get at least 1,000 mg of calcium and at least 600 international units (IU) of vitamin D  per day. If you are older than age 75 but younger than age 30, get at least 1,200 mg of calcium and at least 600 international units (IU) of vitamin D  per day. If you are older than age 8, get at least 1,200 mg of calcium and at least 800 international units (IU) of vitamin D  per day. Smoking and drinking excessive alcohol increase the risk of osteoporosis. Eat foods that are rich in calcium and vitamin D , and do weight-bearing exercises several times each week as directed by your health care provider. How does menopause affect my mental health? Depression may occur at any age, but it is more common as you become older. Common symptoms of depression include: Feeling depressed. Changes in sleep patterns. Changes in appetite or eating patterns. Feeling an overall lack of motivation or enjoyment of activities that you previously enjoyed. Frequent crying spells. Talk with your health care provider if you think that you are experiencing any of these symptoms. General instructions See your health care provider for regular wellness exams and vaccines. This may include: Scheduling regular health, dental, and eye exams. Getting and maintaining your vaccines. These include: Influenza vaccine. Get this vaccine each year before the flu season begins. Pneumonia vaccine. Shingles vaccine. Tetanus, diphtheria, and pertussis (Tdap) booster vaccine. Your health care provider may also recommend other immunizations. Tell your health care provider if you have ever been abused or do not feel safe at home. Summary Menopause is a normal process in which your ability to get pregnant comes to an end. This condition causes hot flashes, night sweats, decreased interest in sex, mood swings, headaches, or lack  of sleep. Treatment for this condition may include hormone replacement therapy. Take actions to keep yourself healthy, including exercising regularly, eating a healthy diet, watching your weight, and checking your blood pressure and blood sugar levels. Get screened for cancer and depression. Make sure that you are up to date with all your vaccines. This information is not intended to replace advice given to you by your health care provider. Make sure you discuss any questions you have with your health care provider. Document Revised: 05/25/2020 Document Reviewed: 05/25/2020 Elsevier Patient Education  2024 [employer].

## 2023-01-20 LAB — COMPLETE METABOLIC PANEL WITH GFR
AG Ratio: 1.5 (calc) (ref 1.0–2.5)
ALT: 11 U/L (ref 6–29)
AST: 14 U/L (ref 10–35)
Albumin: 4.1 g/dL (ref 3.6–5.1)
Alkaline phosphatase (APISO): 60 U/L (ref 37–153)
BUN/Creatinine Ratio: 23 (calc) — ABNORMAL HIGH (ref 6–22)
BUN: 28 mg/dL — ABNORMAL HIGH (ref 7–25)
CO2: 28 mmol/L (ref 20–32)
Calcium: 9.5 mg/dL (ref 8.6–10.4)
Chloride: 105 mmol/L (ref 98–110)
Creat: 1.21 mg/dL — ABNORMAL HIGH (ref 0.50–1.05)
Globulin: 2.7 g/dL (ref 1.9–3.7)
Glucose, Bld: 66 mg/dL (ref 65–99)
Potassium: 4.2 mmol/L (ref 3.5–5.3)
Sodium: 141 mmol/L (ref 135–146)
Total Bilirubin: 0.9 mg/dL (ref 0.2–1.2)
Total Protein: 6.8 g/dL (ref 6.1–8.1)
eGFR: 49 mL/min/{1.73_m2} — ABNORMAL LOW (ref >=60)

## 2023-01-20 LAB — CBC
HCT: 35.4 % (ref 35.0–45.0)
Hemoglobin: 11.7 g/dL (ref 11.7–15.5)
MCH: 29.9 pg (ref 27.0–33.0)
MCHC: 33.1 g/dL (ref 32.0–36.0)
MCV: 90.5 fL (ref 80.0–100.0)
MPV: 9.7 fL (ref 7.5–12.5)
Platelets: 259 10*3/uL (ref 140–400)
RBC: 3.91 10*6/uL (ref 3.80–5.10)
RDW: 13 % (ref 11.0–15.0)
WBC: 4 10*3/uL (ref 3.8–10.8)

## 2023-01-20 LAB — LIPID PANEL
Cholesterol: 151 mg/dL (ref <200)
HDL: 63 mg/dL (ref >=50)
LDL Cholesterol (Calc): 73 mg/dL
Non-HDL Cholesterol (Calc): 88 mg/dL (ref <130)
Total CHOL/HDL Ratio: 2.4 (calc) (ref <5.0)
Triglycerides: 74 mg/dL (ref <150)

## 2023-01-20 LAB — MICROALBUMIN / CREATININE URINE RATIO
Creatinine, Urine: 132 mg/dL (ref 20–275)
Microalb Creat Ratio: 6 mg/g{creat} (ref <30)
Microalb, Ur: 0.8 mg/dL

## 2023-01-20 NOTE — Telephone Encounter (Signed)
 Requested Prescriptions  Pending Prescriptions Disp Refills   ONETOUCH ULTRA test strip [Pharmacy Med Name: OneTouch Ultra In Vitro Strip] 200 strip 2    Sig: USE TO CHECK BLOOD SUGAR TWICE  DAILY AS DIRECTED     Endocrinology: Diabetes - Testing Supplies Passed - 01/20/2023  3:27 PM      Passed - Valid encounter within last 12 months    Recent Outpatient Visits           Yesterday Encounter for general adult medical examination with abnormal findings   [place] [facility] [place], Angeline ORN, NP   3 months ago Viral URI with cough   [facility] [place], Angeline ORN, NP   7 months ago Aortic atherosclerosis [facility])   [place] [place], Angeline ORN, NP   8 months ago Frequent headaches   [place] [place], Angeline ORN, NP   1 year ago Encounter for general adult medical examination with abnormal findings   [place] [facility] [place], Angeline ORN, NP       Future Appointments             In 6 months Baity, Angeline ORN, NP [place] [facility], [facility]

## 2023-01-23 ENCOUNTER — Encounter: Payer: Self-pay | Admitting: Internal Medicine

## 2023-02-02 DIAGNOSIS — H2513 Age-related nuclear cataract, bilateral: Secondary | ICD-10-CM | POA: Diagnosis not present

## 2023-02-02 DIAGNOSIS — E119 Type 2 diabetes mellitus without complications: Secondary | ICD-10-CM | POA: Diagnosis not present

## 2023-02-02 LAB — HM DIABETES EYE EXAM

## 2023-02-28 ENCOUNTER — Other Ambulatory Visit: Payer: Self-pay | Admitting: Internal Medicine

## 2023-03-01 NOTE — Telephone Encounter (Signed)
Requested Prescriptions  Pending Prescriptions Disp Refills   irbesartan (AVAPRO) 300 MG tablet [Pharmacy Med Name: Irbesartan 300 MG Oral Tablet] 90 tablet 1    Sig: TAKE 1 TABLET BY MOUTH DAILY     Cardiovascular:  Angiotensin Receptor Blockers Failed - 03/01/2023  2:15 PM      Failed - Cr in normal range and within 180 days    Creat  Date Value Ref Range Status  01/19/2023 1.21 (H) 0.50 - 1.05 mg/dL Final   Creatinine,U  Date Value Ref Range Status  04/03/2014 447.0 mg/dL Final   Creatinine, Urine  Date Value Ref Range Status  01/19/2023 132 20 - 275 mg/dL Final         Passed - K in normal range and within 180 days    Potassium  Date Value Ref Range Status  01/19/2023 4.2 3.5 - 5.3 mmol/L Final  10/12/2013 3.9 3.5 - 5.1 mmol/L Final         Passed - Patient is not pregnant      Passed - Last BP in normal range    BP Readings from Last 1 Encounters:  01/19/23 118/68         Passed - Valid encounter within last 6 months    Recent Outpatient Visits           1 month ago Encounter for general adult medical examination with abnormal findings   [place] [facility] [place], Salvadore Oxford, NP   5 months ago Viral URI with cough   [place] [facility] [place], Salvadore Oxford, NP   8 months ago Aortic atherosclerosis [facility])   [facility] [place], Salvadore Oxford, NP   10 months ago Frequent headaches   [place] [place], Salvadore Oxford, NP   1 year ago Encounter for general adult medical examination with abnormal findings   [facility] [place], Salvadore Oxford, NP       Future Appointments             In 5 months Baity, Salvadore Oxford, NP [facility], [facility]

## 2023-04-11 ENCOUNTER — Encounter: Payer: Self-pay | Admitting: Internal Medicine

## 2023-04-13 ENCOUNTER — Ambulatory Visit (INDEPENDENT_AMBULATORY_CARE_PROVIDER_SITE_OTHER): Admitting: Internal Medicine

## 2023-04-13 ENCOUNTER — Encounter: Payer: Self-pay | Admitting: Internal Medicine

## 2023-04-13 VITALS — BP 138/70 | Ht 65.0 in | Wt 207.2 lb

## 2023-04-13 DIAGNOSIS — G5602 Carpal tunnel syndrome, left upper limb: Secondary | ICD-10-CM

## 2023-04-13 DIAGNOSIS — T753XXA Motion sickness, initial encounter: Secondary | ICD-10-CM

## 2023-04-13 MED ORDER — SCOPOLAMINE 1 MG/3DAYS TD PT72: 1.0000 | MEDICATED_PATCH | TRANSDERMAL | 0 refills | Status: DC

## 2023-04-13 NOTE — Patient Instructions (Signed)
 Pinched Nerve in the Wrist (Carpal Tunnel Syndrome): What to Know  Pinched nerve in the wrist (carpal tunnel syndrome, or CTS) is a nerve problem that causes pain, numbness, and weakness in the wrist, hand, and fingers. The carpal tunnel is a narrow space that is on the palm side of your wrist. Repeated wrist motions or certain diseases may cause swelling in the tunnel. This swelling can pinch the main nerve in the wrist (the median nerve). What are the causes? CTS may be caused by: Moving your hand and wrist over and over again while doing a task. Hurting the wrist. Arthritis. A pocket of fluid (cyst) or a growth (tumor) in the carpal tunnel. Fluid buildup when you are pregnant. Use of tools that vibrate. In some cases, the cause of CTS is not known. What increases the risk? You're more likely to have CTS if: You have a job that makes you do these things: Move your hand firmly over and over again. Work with tools that vibrate, such as drills or sanders. You're female. You have diabetes, obesity, thyroid problems, or kidney failure. What are the signs or symptoms? Symptoms of this condition include: A tingling feeling in your fingers. You may feel this pain in the thumb, index finger, or middle finger. Tingling or loss of feeling in your hand. Pain in your entire arm. This pain may get worse when you bend your wrist and elbow for a long time. Pain in your wrist that goes up your arm to your shoulder. Pain that goes down into your palm or fingers. Weakness in your hands. You may find it hard to grab and hold items. Your symptoms may feel worse during the night. How is this diagnosed? CTS is diagnosed with a medical history and physical exam. Tests and imaging may also be done to: Check the electrical signals sent by your nerves into the muscles. Check how well electrical signals pass through your nerves. Check possible causes of your CTS. These include X-rays, ultrasound, and  MRI. How is this treated? CTS may be treated with: Lifestyle changes. You will be asked to stop or change the activity that caused your problem. Physical therapy. This may include: Exercises that stretch and strengthen the muscles and tendons in the wrist and hand. Nerve gliding or flossing exercises. These help keep nerves moving smoothly through the tissues around them. Occupational therapy. You'll learn how to use your hand again. Medicines for pain and swelling. You may have injections in your wrist. A wrist splint or brace. Surgery. Follow these instructions at home: If you have a splint or brace: Wear the splint or brace as told. Take it off only if your provider says you can. Check the skin around it every day. Tell your provider if you see problems. Loosen the splint or brace if your fingers tingle, are numb, or turn cold and blue. Keep the splint or brace clean and dry. If the splint or brace isn't waterproof: Do not let it get wet. Cover it when you take a bath or shower. Use a cover that doesn't let any water in. Managing pain, stiffness, and swelling  Use ice or an ice pack as told. If you have a splint or brace that you can take off, remove it only as told. Place a towel between your skin and the ice. Leave the ice on for 20 minutes, 2-3 times a day. If your skin turns red, take off the ice right away to prevent skin damage. The risk  of damage is higher if you can't feel pain, heat, or cold. Move your fingers often to reduce stiffness and swelling. General instructions Take your medicines only as told. Rest your wrist and hand from activity that may cause pain. If your CTS is caused by things you do at work, talk with your employer about making changes. For example, you may need a wrist pad to use while typing. Exercise as told. Follow instructions on how to do nerve gliding or flossing exercises. These help keep nerves in moving smoothly through the tissues around  them. Keep all follow-up visits. This is important. Where to find more information American Academy of Orthopedic Surgeons: orthoinfo.aaos.[employer] of Neurological Disorders and Stroke: BasicFM.no Contact a health care provider if: You have new symptoms. Your pain is not controlled with medicines. Your symptoms get worse. Get help right away if: Your hand or wrist tingles or is numb, and the symptoms become very bad. This information is not intended to replace advice given to you by your health care provider. Make sure you discuss any questions you have with your health care provider. Document Revised: 11/15/2022 Document Reviewed: 09/02/2022 Elsevier Patient Education  2024 [employer].

## 2023-04-13 NOTE — Progress Notes (Signed)
 Subjective:    Patient ID: Carrie Singh, female    DOB: 1955/11/17, 68 y.o.   MRN: [id number]  HPI  Discussed the use of AI scribe software for clinical note transcription with the patient, who gave verbal consent to proceed.  Carrie Singh is a 68 year old female who presents with hand pain and numbness.  She has been experiencing pain in her hands for the past few months, described as a tightness that feels like it wants to burst. The pain occurs frequently but not daily. She experiences a combination of numbness and tingling, particularly at night, with symptoms more pronounced in the left hand, although she is right-handed.  The symptoms affect the palm.. No burning sensation, swelling, or rashes are present. She has not used any medications like Tylenol or Ibuprofen for relief and has not noticed any significant joint issues.     Additionally, she is requesting scopolamine patches for an upcoming cruise.  Her cruise for last 7 days.  Review of Systems     Past Medical History:  Diagnosis Date   Breast cancer (HCC) 01/2021   left breast IDC   Breast cancer, right breast (HCC) 12/2007   a. s/p chemo, radiation, and lumpectomy with negative sentinel lymph node biopsy    Complication of anesthesia    woke up during colonoscopy   Depression    Diabetes mellitus without complication (HCC)    GERD (gastroesophageal reflux disease)    Hyperlipidemia    Hypertension    resistant, negative renal Dopplers and negative CT angiogram   Migraines    Obesity     Current Outpatient Medications  Medication Sig Dispense Refill   amLODipine (NORVASC) 10 MG tablet Take 1 tablet (10 mg total) by mouth daily. 90 tablet 1   aspirin EC 81 MG tablet Take 1 tablet (81 mg total) by mouth daily. Swallow whole. 30 tablet 12   Biotin w/ Vitamins C & E (HAIR SKIN & NAILS GUMMIES PO) Take by mouth.     calcium carbonate (OSCAL) 1500 (600 Ca) MG TABS tablet Take by mouth 2 (two) times daily with  a meal.     carvedilol (COREG) 12.5 MG tablet Take 12.5 mg by mouth in the morning and at bedtime.     fluticasone (FLONASE) 50 MCG/ACT nasal spray Place 2 sprays into both nostrils daily for 7 days. 1 g 0   hydrALAZINE (APRESOLINE) 100 MG tablet TAKE 1 TABLET BY MOUTH 3 TIMES  DAILY 300 tablet 1   irbesartan (AVAPRO) 300 MG tablet TAKE 1 TABLET BY MOUTH DAILY 90 tablet 1   Lancets (ONETOUCH DELICA PLUS LANCET33G) MISC CHECK BLOOD SUGAR TWICE DAILY 200 each 2   letrozole (FEMARA) 2.5 MG tablet Take 1 tablet (2.5 mg total) by mouth daily. 90 tablet 3   lidocaine (LIDODERM) 5 % Place 1 patch onto the skin daily. Remove & Discard patch within 12 hours or as directed by MD 5 patch 0   metFORMIN (GLUCOPHAGE) 1000 MG tablet Take 1,000 mg by mouth 2 (two) times daily with a meal.     Multiple Vitamins-Minerals (ONE-A-DAY WOMENS VITACRAVES) CHEW Chew 1 tablet by mouth daily.      ONETOUCH ULTRA test strip USE TO CHECK BLOOD SUGAR TWICE  DAILY AS DIRECTED 200 strip 2   pravastatin (PRAVACHOL) 40 MG tablet TAKE 1 TABLET BY MOUTH DAILY 100 tablet 1   repaglinide (PRANDIN) 0.5 MG tablet Take 0.5 mg by mouth 3 (three) times daily before  meals.     No current facility-administered medications for this visit.    No Known Allergies  Family History  Problem Relation Age of Onset   Stroke Mother    Colon cancer Father 15   Heart disease Father    Asthma Father    Lung cancer Sister    Colon cancer Brother 45   Diabetes Brother    Breast cancer Niece        dx. <50   Breast cancer Niece        dx. <50   Breast cancer Niece        dx. >50   Esophageal cancer Neg Hx    Rectal cancer Neg Hx    Stomach cancer Neg Hx     Social History   Socioeconomic History   Marital status: Married    Spouse name: Josaphine Shimamoto   Number of children: 3   Years of education: 12+   Highest education level: Some college, no degree  Occupational History   Occupation: Disabled  Tobacco Use   Smoking status:  Never   Smokeless tobacco: Never  Vaping Use   Vaping status: Never Used  Substance and Sexual Activity   Alcohol use: No    Alcohol/week: 0.0 standard drinks of alcohol   Drug use: No   Sexual activity: Yes  Other Topics Concern   Not on file  Social History Narrative   Lives at home with husband.   Caffeine use: 1-2 drinks per month         Epworth Sleepiness Scale = 15 (as of 01/01/15)   Social Drivers of Health   Financial Resource Strain: Low Risk  (04/12/2023)   Overall Financial Resource Strain (CARDIA)    Difficulty of Paying Living Expenses: Not hard at all  Food Insecurity: No Food Insecurity (04/12/2023)   Hunger Vital Sign    Worried About Running Out of Food in the Last Year: Never true    Ran Out of Food in the Last Year: Never true  Transportation Needs: No Transportation Needs (04/12/2023)   PRAPARE - Administrator, Civil Service (Medical): No    Lack of Transportation (Non-Medical): No  Physical Activity: Insufficiently Active (04/12/2023)   Exercise Vital Sign    Days of Exercise per Week: 2 days    Minutes of Exercise per Session: 20 min  Stress: No Stress Concern Present (04/12/2023)   [employer] of Occupational Health - Occupational Stress Questionnaire    Feeling of Stress : Not at all  Social Connections: Socially Integrated (04/12/2023)   Social Connection and Isolation Panel [NHANES]    Frequency of Communication with Friends and Family: More than three times a week    Frequency of Social Gatherings with Friends and Family: Once a week    Attends Religious Services: More than 4 times per year    Active Member of [employer] or Organizations: Yes    Attends Banker Meetings: More than 4 times per year    Marital Status: Married  Catering manager Violence: Not At Risk (07/29/2022)   Humiliation, Afraid, Rape, and Kick questionnaire    Fear of Current or Ex-Partner: No    Emotionally Abused: No    Physically Abused: No     Sexually Abused: No     Constitutional: Patient reports rare headaches.  Denies fever, malaise, fatigue, or abrupt weight changes.  Respiratory: Denies difficulty breathing, shortness of breath, cough or sputum production.   Cardiovascular: Denies chest  pain, chest tightness, palpitations or swelling in the hands or feet.  Musculoskeletal: Patient reports hand pain, joint pain, difficulty with gait.  Denies decrease in range of motion, muscle pain or joint swelling.  Skin: Denies redness, rashes, lesions or ulcercations.  Neurological: Pt reports paresthesia of hands. Denies dizziness, difficulty with memory, difficulty with speech or problems with balance and coordination.    No other specific complaints in a complete review of systems (except as listed in HPI above).  Objective:  BP 138/70   Ht 5\' 5"  (1.651 m)   Wt 207 lb 3.2 oz (94 kg)   BMI 34.48 kg/m     Wt Readings from Last 3 Encounters:  01/19/23 206 lb 3.2 oz (93.5 kg)  01/17/23 208 lb (94.3 kg)  12/29/22 207 lb 14.4 oz (94.3 kg)    General: Appears her stated age, obese, in NAD. Skin: Warm, dry and intact. No  ulcerations noted. Cardiovascular: Normal rate and rhythm. S1,S2 noted.  No murmur, rubs or gallops noted.  Radial pulses 2+ bilaterally. Pulmonary/Chest: Normal effort and positive vesicular breath sounds. No respiratory distress. No wheezes, rales or ronchi noted.  Musculoskeletal: Normal flexion, extension, rotation of the wrist.  Normal flexion extension of the fingers.  No joint swelling noted.  Strength 5/5 BUE.  Handgrips equal. Neurological: Alert and oriented. Cranial nerves II-XII grossly intact.  Positive Tinel's on the left.  Negative Phalen's bilaterally.  Coordination normal.    BMET    Component Value Date/Time   NA 141 01/19/2023 0918   NA 146 (H) 01/20/2015 1457   NA 143 10/12/2013 1510   K 4.2 01/19/2023 0918   K 3.9 10/12/2013 1510   CL 105 01/19/2023 0918   CL 109 (H) 10/12/2013 1510    CO2 28 01/19/2023 0918   CO2 29 10/12/2013 1510   GLUCOSE 66 01/19/2023 0918   GLUCOSE 92 10/12/2013 1510   BUN 28 (H) 01/19/2023 0918   BUN 17 01/20/2015 1457   BUN 16 10/12/2013 1510   CREATININE 1.21 (H) 01/19/2023 0918   CALCIUM 9.5 01/19/2023 0918   CALCIUM 9.4 10/12/2013 1510   GFRNONAA 39 (L) 01/17/2023 2119   GFRNONAA 56 (L) 10/12/2013 1510   GFRAA >60 01/24/2018 2159   GFRAA >60 10/12/2013 1510    Lipid Panel     Component Value Date/Time   CHOL 151 01/19/2023 0918   CHOL 179 10/17/2014 1527   TRIG 74 01/19/2023 0918   HDL 63 01/19/2023 0918   HDL 59 10/17/2014 1527   CHOLHDL 2.4 01/19/2023 0918   VLDL 15.2 01/09/2020 0957   LDLCALC 73 01/19/2023 0918    CBC    Component Value Date/Time   WBC 4.0 01/19/2023 0918   RBC 3.91 01/19/2023 0918   HGB 11.7 01/19/2023 0918   HGB 13.5 10/12/2013 1510   HGB 12.8 08/13/2009 0946   HCT 35.4 01/19/2023 0918   HCT 40.5 10/12/2013 1510   HCT 36.8 08/13/2009 0946   PLT 259 01/19/2023 0918   PLT 278 10/12/2013 1510   PLT 271 08/13/2009 0946   MCV 90.5 01/19/2023 0918   MCV 87 10/12/2013 1510   MCV 87.0 08/13/2009 0946   MCH 29.9 01/19/2023 0918   MCHC 33.1 01/19/2023 0918   RDW 13.0 01/19/2023 0918   RDW 13.6 10/12/2013 1510   RDW 13.8 08/13/2009 0946   LYMPHSABS 1.4 01/17/2023 2119   LYMPHSABS 1.5 08/13/2009 0946   MONOABS 0.4 01/17/2023 2119   MONOABS 0.2 08/13/2009 0946   EOSABS  0.0 01/17/2023 2119   EOSABS 0.1 08/13/2009 0946   BASOSABS 0.0 01/17/2023 2119   BASOSABS 0.0 08/13/2009 0946    Hgb A1C Lab Results  Component Value Date   HGBA1C 5.6 01/19/2023            Assessment & Plan:   Assessment and Plan    Carpal Tunnel Syndrome Symptoms and positive Phalen's test suggest median nerve compression. Prefers non-invasive measures due to kidney concerns with ibuprofen. - Recommend night use of cock-up splint to reduce nerve pressure. - Advise using a pillow to avoid arm pressure during  sleep. - Consider acetaminophen at bedtime for pain relief. - Discussed prednisone but will try splint first. - Consider neurologist referral for nerve testing if symptoms worsen.  Seasickness -Scopolamine transdermal patch, apply 1 patch every 3 days as needed  RTC in 4 months, follow-up chronic conditions Nicki Reaper, NP

## 2023-04-21 ENCOUNTER — Other Ambulatory Visit: Payer: Self-pay | Admitting: Internal Medicine

## 2023-04-24 NOTE — Telephone Encounter (Signed)
 Requested Prescriptions  Pending Prescriptions Disp Refills   pravastatin (PRAVACHOL) 40 MG tablet [Pharmacy Med Name: Pravastatin Sodium 40 MG Oral Tablet] 100 tablet 0    Sig: TAKE 1 TABLET BY MOUTH DAILY     Cardiovascular:  Antilipid - Statins Failed - 04/24/2023 11:16 AM      Failed - Valid encounter within last 12 months    Recent Outpatient Visits           1 week ago Carpal tunnel syndrome on left   [facility] [place], Salvadore Oxford, NP       Future Appointments             In 3 months [place], Salvadore Oxford, NP [facility]            Failed - Lipid Panel in normal range within the last 12 months    Cholesterol, Total  Date Value Ref Range Status  10/17/2014 179 100 - 199 mg/dL Final   Cholesterol  Date Value Ref Range Status  01/19/2023 151 <200 mg/dL Final   LDL Cholesterol (Calc)  Date Value Ref Range Status  01/19/2023 73 mg/dL (calc) Final    Comment:    Reference range: <100 . Desirable range <100 mg/dL for primary prevention;   <70 mg/dL for patients with CHD or diabetic patients  with > or = 2 CHD risk factors. Marland Kitchen LDL-C is now calculated using the Martin-Hopkins  calculation, which is a validated novel method providing  better accuracy than the Friedewald equation in the  estimation of LDL-C.  Horald Pollen et al. Lenox Ahr. 1610;960(45): 2061-2068  (http://education.[facility].com/faq/FAQ164)    Direct LDL  Date Value Ref Range Status  09/05/2013 169.6 mg/dL Final    Comment:    Optimal:  <100 mg/dLNear or Above Optimal:  100-129 mg/dLBorderline High:  130-159 mg/dLHigh:  160-189 mg/dLVery High:  >190 mg/dL   HDL  Date Value Ref Range Status  01/19/2023 63 > OR = 50 mg/dL Final  40/98/1191 59 >47 mg/dL Final    Comment:    According to ATP-III Guidelines, HDL-C >59 mg/dL is considered a negative risk factor for CHD.    Triglycerides  Date Value Ref Range Status  01/19/2023 74 <150 mg/dL  Final         Passed - Patient is not pregnant       hydrALAZINE (APRESOLINE) 100 MG tablet [Pharmacy Med Name: hydrALAZINE HCl 100 MG Oral Tablet] 300 tablet 0    Sig: TAKE 1 TABLET BY MOUTH 3 TIMES  DAILY     Cardiovascular:  Vasodilators Failed - 04/24/2023 11:16 AM      Failed - ANA Screen, Ifa, Serum in normal range and within 360 days    Anti Nuclear Antibody(ANA)  Date Value Ref Range Status  10/17/2014 Negative Negative Final         Failed - Valid encounter within last 12 months    Recent Outpatient Visits           1 week ago Carpal tunnel syndrome on left   [place] [facility] [place], Salvadore Oxford, NP       Future Appointments             In 3 months Baity, Salvadore Oxford, NP [facility]            Passed - HCT in normal range and within 360 days    HCT  Date Value Ref Range Status  01/19/2023 35.4 35.0 - 45.0 % Final  10/12/2013 40.5 35.0 - 47.0 % Final  08/13/2009 36.8 34.8 - 46.6 % Final         Passed - HGB in normal range and within 360 days    Hemoglobin  Date Value Ref Range Status  01/19/2023 11.7 11.7 - 15.5 g/dL Final   HGB  Date Value Ref Range Status  10/12/2013 13.5 12.0 - 16.0 g/dL Final  16/10/9602 54.0 11.6 - 15.9 g/dL Final         Passed - RBC in normal range and within 360 days    RBC  Date Value Ref Range Status  01/19/2023 3.91 3.80 - 5.10 Million/uL Final         Passed - WBC in normal range and within 360 days    WBC  Date Value Ref Range Status  01/19/2023 4.0 3.8 - 10.8 Thousand/uL Final         Passed - PLT in normal range and within 360 days    Platelets  Date Value Ref Range Status  01/19/2023 259 140 - 400 Thousand/uL Final  08/13/2009 271 145 - 400 10e3/uL Final   Platelet  Date Value Ref Range Status  10/12/2013 278 150 - 440 x10 3/mm 3 Final         Passed - Last BP in normal range    BP Readings from Last 1 Encounters:  04/13/23 138/70

## 2023-05-12 ENCOUNTER — Encounter: Payer: Self-pay | Admitting: Internal Medicine

## 2023-05-12 ENCOUNTER — Other Ambulatory Visit: Payer: Self-pay

## 2023-05-12 ENCOUNTER — Other Ambulatory Visit: Payer: Self-pay | Admitting: Internal Medicine

## 2023-05-12 DIAGNOSIS — I10 Essential (primary) hypertension: Secondary | ICD-10-CM

## 2023-05-12 MED ORDER — AMLODIPINE BESYLATE 10 MG PO TABS: 10.0000 mg | ORAL_TABLET | Freq: Every day | ORAL | 1 refills | Status: DC

## 2023-05-15 ENCOUNTER — Telehealth: Payer: Self-pay

## 2023-05-15 ENCOUNTER — Encounter: Payer: Self-pay | Admitting: Internal Medicine

## 2023-05-15 ENCOUNTER — Other Ambulatory Visit: Payer: Self-pay

## 2023-05-15 ENCOUNTER — Encounter: Payer: Self-pay | Admitting: Hematology and Oncology

## 2023-05-15 DIAGNOSIS — I10 Essential (primary) hypertension: Secondary | ICD-10-CM

## 2023-05-15 MED ORDER — AMLODIPINE BESYLATE 10 MG PO TABS: 10.0000 mg | ORAL_TABLET | Freq: Every day | ORAL | 1 refills | Status: DC

## 2023-05-15 MED ORDER — CARVEDILOL 12.5 MG PO TABS: 12.5000 mg | ORAL_TABLET | Freq: Two times a day (BID) | ORAL | 0 refills | Status: DC

## 2023-05-15 NOTE — Telephone Encounter (Signed)
 Yes, okay to refill requested meds

## 2023-05-15 NOTE — Telephone Encounter (Signed)
 S/w pt per MyChart message. Reports she noticed pain and lump about two weeks ago. Reports the pain is sometimes a "sharp shooting pain" and other times it is "a dull ache" Reports the lump to her right axilla is "a little swollen" but denies any erythema or heat to affected area. She was offered appt with Alwin Baars for [facility] 4/29 but states she can't make that appt d/t husband prior appt at [facility]. She accepted for 4/30 at 0920.

## 2023-05-17 ENCOUNTER — Other Ambulatory Visit: Payer: Self-pay | Admitting: *Deleted

## 2023-05-17 ENCOUNTER — Encounter: Payer: Self-pay | Admitting: Adult Health

## 2023-05-17 ENCOUNTER — Telehealth: Payer: Self-pay | Admitting: *Deleted

## 2023-05-17 ENCOUNTER — Inpatient Hospital Stay: Attending: Adult Health | Admitting: Adult Health

## 2023-05-17 VITALS — BP 134/78 | HR 77 | Temp 98.4°F | Resp 17 | Wt 205.6 lb

## 2023-05-17 DIAGNOSIS — C50112 Malignant neoplasm of central portion of left female breast: Secondary | ICD-10-CM | POA: Diagnosis not present

## 2023-05-17 DIAGNOSIS — Z170 Estrogen receptor positive status [ER+]: Secondary | ICD-10-CM

## 2023-05-17 DIAGNOSIS — Z79811 Long term (current) use of aromatase inhibitors: Secondary | ICD-10-CM | POA: Diagnosis not present

## 2023-05-17 DIAGNOSIS — C50912 Malignant neoplasm of unspecified site of left female breast: Secondary | ICD-10-CM

## 2023-05-17 NOTE — Progress Notes (Signed)
 [facility] Cancer Follow up:    Carrie Cirri, NP [address] [place] [address]   DIAGNOSIS:  Cancer Staging  Malignant neoplasm of central portion of left breast in female, estrogen receptor positive (HCC) Staging form: Breast, AJCC 8th Edition - Pathologic: pT1c, pN0, cM0 - Unsigned  SUMMARY OF ONCOLOGIC HISTORY: Oncology History  Malignant neoplasm of central portion of left breast in female, estrogen receptor positive (HCC)  12/18/2007 Initial Diagnosis   Right breast cancer treated with lumpectomy followed by radiation and 5 years of antiestrogen therapy with aromatase inhibitor   02/02/2016 Relapse/Recurrence   Right breast biopsy 6:00 position: IDC with DCIS grade 2, ER 100%, PR 90%, Ki-67 10%, HER-2 negative ratio 1.28, screening detected right breast lumps 0.5 cm and 0.3 cm, T1a N0 stage IA clinical stage   03/18/2016 Surgery   Right simple mastectomy: Grade 2 IDC with DCIS 1.5 cm, margins -0/3 lymph nodes, T1b N0 stage IA, ER 100%, PR 90%, Ki-67 10%, HER-2 negative ratio 1.28   03/18/2016 Oncotype testing   11/7%   04/2016 -  Anti-estrogen oral therapy   Letrozole  2.5mg  daily switch to exemestane  25 mg daily May 2018 switched to anastrozole  1 mg daily due to cost   03/10/2021 Surgery   Left Mastectomy : Grade 2 IDC 1.1 cm, 0/8 LN neg, ER 100%, PR: 0%, ki 67: 15%, HER 2 neg      04/2021 -  Anti-estrogen oral therapy   Letrozole  daily    Genetic Testing   [facility] CancerNext-Expanded Panel was Negative. Report date is 08/09/2021.  The CancerNext-Expanded gene panel offered by [facility] and includes sequencing, rearrangement, and RNA analysis for the following 77 genes: AIP, ALK, APC, ATM, AXIN2, BAP1, BARD1, BLM, BMPR1A, BRCA1, BRCA2, BRIP1, CDC73, CDH1, CDK4, CDKN1B, CDKN2A, CHEK2, CTNNA1, DICER1, FANCC, FH, FLCN, GALNT12, KIF1B, LZTR1, MAX, MEN1, MET, MLH1, MSH2, MSH3, MSH6, MUTYH, NBN, NF1, NF2, NTHL1, PALB2, PHOX2B, PMS2, POT1, PRKAR1A, PTCH1, PTEN,  RAD51C, RAD51D, RB1, RECQL, RET, SDHA, SDHAF2, SDHB, SDHC, SDHD, SMAD4, SMARCA4, SMARCB1, SMARCE1, STK11, SUFU, TMEM127, TP53, TSC1, TSC2, VHL and XRCC2 (sequencing and deletion/duplication); EGFR, EGLN1, HOXB13, KIT, MITF, PDGFRA, POLD1, and POLE (sequencing only); EPCAM and GREM1 (deletion/duplication only).    Malignant neoplasm of left breast in female, estrogen receptor positive (HCC) (Resolved)  02/18/2021 Initial Diagnosis   Malignant neoplasm of left breast in female, estrogen receptor positive (HCC)   03/10/2021 Cancer Staging   Staging form: Breast, AJCC 8th Edition - Pathologic stage from 03/10/2021: Stage IA (pT1c, pN0, cM0, G2, ER+, PR-, HER2-) - Signed by Percival Brace, NP on 06/21/2021 Stage prefix: Initial diagnosis Histologic grading system: 3 grade system    Genetic Testing   [facility] CancerNext-Expanded Panel was Negative. Report date is 08/09/2021.  The CancerNext-Expanded gene panel offered by [facility] and includes sequencing, rearrangement, and RNA analysis for the following 77 genes: AIP, ALK, APC, ATM, AXIN2, BAP1, BARD1, BLM, BMPR1A, BRCA1, BRCA2, BRIP1, CDC73, CDH1, CDK4, CDKN1B, CDKN2A, CHEK2, CTNNA1, DICER1, FANCC, FH, FLCN, GALNT12, KIF1B, LZTR1, MAX, MEN1, MET, MLH1, MSH2, MSH3, MSH6, MUTYH, NBN, NF1, NF2, NTHL1, PALB2, PHOX2B, PMS2, POT1, PRKAR1A, PTCH1, PTEN, RAD51C, RAD51D, RB1, RECQL, RET, SDHA, SDHAF2, SDHB, SDHC, SDHD, SMAD4, SMARCA4, SMARCB1, SMARCE1, STK11, SUFU, TMEM127, TP53, TSC1, TSC2, VHL and XRCC2 (sequencing and deletion/duplication); EGFR, EGLN1, HOXB13, KIT, MITF, PDGFRA, POLD1, and POLE (sequencing only); EPCAM and GREM1 (deletion/duplication only).      CURRENT THERAPY: letrozole   INTERVAL HISTORY:  Discussed the use of  AI scribe software for clinical note transcription with the patient, who gave verbal consent to proceed.  Stacia Dynes 68 y.o. female with a history of breast cancer who presents with right-sided chest pain.  She  experiences severe right-sided chest pain for the last two to three weeks, described as a jabbing sensation. The pain is located on the right side, extending under the arm and across the chest, in the area of previous surgeries. It worsens when lying down or turning. She has not taken any medication for the pain as it is not constant, but it can be excruciating at times.  Her surgical history includes procedures in 2009 and 2018, with the most recent surgery on the other side in 2023. She has not noticed any redness or swelling in the right arm and is unsure about any changes in the area. No new lumps or bumps have been noticed, although she thought she might have felt something but was unsure.  She continues to take letrozole  daily without any issues and reports no changes in her medication regimen. No cough, shortness of breath, palpitations, fevers, or chills.   Patient Active Problem List   Diagnosis Date Noted   Aortic atherosclerosis (HCC) 06/17/2021   Chronic foot pain, right 06/17/2021   Class 1 obesity due to excess calories with body mass index (BMI) of 34.0 to 34.9 in adult 08/21/2020   GERD (gastroesophageal reflux disease) 11/30/2017   Intractable episodic cluster headache 12/04/2014   Depression with somatization 12/04/2014   HTN (hypertension) 04/24/2013   HLD (hyperlipidemia) 04/24/2013   DM2 (diabetes mellitus, type 2) (HCC) 04/24/2013   Malignant neoplasm of central portion of left breast in female, estrogen receptor positive (HCC) 12/18/2007    has no known allergies.  MEDICAL HISTORY: Past Medical History:  Diagnosis Date   Breast cancer (HCC) 01/2021   left breast IDC   Breast cancer, right breast (HCC) 12/2007   a. s/p chemo, radiation, and lumpectomy with negative sentinel lymph node biopsy    Complication of anesthesia    woke up during colonoscopy   Depression    Diabetes mellitus without complication (HCC)    GERD (gastroesophageal reflux disease)     Hyperlipidemia    Hypertension    resistant, negative renal Dopplers and negative CT angiogram   Migraines    Obesity     SURGICAL HISTORY: Past Surgical History:  Procedure Laterality Date   ABDOMINAL HYSTERECTOMY     BREAST LUMPECTOMY     CYST EXCISION     FOOT SURGERY     MASTECTOMY Right    MASTECTOMY W/ SENTINEL NODE BIOPSY Right 03/18/2016   Procedure: RIGHT MASTECTOMY WITH RIGHT AXILLARY SENTINEL LYMPH NODE BIOPSY;  Surgeon: Juanita Norlander, MD;  Location: [facility] OR;  Service: General;  Laterality: Right;   MASTECTOMY W/ SENTINEL NODE BIOPSY Left 03/10/2021   Procedure: LEFT MASTECTOMY WITH LEFT SENTINEL LYMPH NODE BIOPSY;  Surgeon: Sim Dryer, MD;  Location: [facility];  Service: General;  Laterality: Left;   ovary tumor     vocal cord nodules      SOCIAL HISTORY: Social History   Socioeconomic History   Marital status: Married    Spouse name: Yvanna Jarboe   Number of children: 3   Years of education: 12+   Highest education level: Some college, no degree  Occupational History   Occupation: Disabled  Tobacco Use   Smoking status: Never   Smokeless tobacco: Never  Vaping Use   Vaping status:  Never Used  Substance and Sexual Activity   Alcohol use: No    Alcohol/week: 0.0 standard drinks of alcohol   Drug use: No   Sexual activity: Yes  Other Topics Concern   Not on file  Social History Narrative   Lives at home with husband.   Caffeine  use: 1-2 drinks per month         Epworth Sleepiness Scale = 15 (as of 01/01/15)   Social Drivers of Health   Financial Resource Strain: Low Risk  (04/12/2023)   Overall Financial Resource Strain (CARDIA)    Difficulty of Paying Living Expenses: Not hard at all  Food Insecurity: No Food Insecurity (04/12/2023)   Hunger Vital Sign    Worried About Running Out of Food in the Last Year: Never true    Ran Out of Food in the Last Year: Never true  Transportation Needs: No Transportation Needs (04/12/2023)    PRAPARE - Administrator, Civil Service (Medical): No    Lack of Transportation (Non-Medical): No  Physical Activity: Insufficiently Active (04/12/2023)   Exercise Vital Sign    Days of Exercise per Week: 2 days    Minutes of Exercise per Session: 20 min  Stress: No Stress Concern Present (04/12/2023)   [employer] of Occupational Health - Occupational Stress Questionnaire    Feeling of Stress : Not at all  Social Connections: Socially Integrated (04/12/2023)   Social Connection and Isolation Panel [NHANES]    Frequency of Communication with Friends and Family: More than three times a week    Frequency of Social Gatherings with Friends and Family: Once a week    Attends Religious Services: More than 4 times per year    Active Member of [employer] or Organizations: Yes    Attends Engineer, structural: More than 4 times per year    Marital Status: Married  Catering manager Violence: Not At Risk (07/29/2022)   Humiliation, Afraid, Rape, and Kick questionnaire    Fear of Current or Ex-Partner: No    Emotionally Abused: No    Physically Abused: No    Sexually Abused: No    FAMILY HISTORY: Family History  Problem Relation Age of Onset   Stroke Mother    Colon cancer Father 56   Heart disease Father    Asthma Father    Lung cancer Sister    Colon cancer Brother 48   Diabetes Brother    Breast cancer Niece        dx. <50   Breast cancer Niece        dx. <50   Breast cancer Niece        dx. >50   Esophageal cancer Neg Hx    Rectal cancer Neg Hx    Stomach cancer Neg Hx     Review of Systems  Constitutional:  Negative for appetite change, chills, fatigue, fever and unexpected weight change.  HENT:   Negative for hearing loss, lump/mass and trouble swallowing.   Eyes:  Negative for eye problems and icterus.  Respiratory:  Negative for chest tightness, cough and shortness of breath.   Cardiovascular:  Negative for chest pain, leg swelling and palpitations.   Gastrointestinal:  Negative for abdominal distention, abdominal pain, constipation, diarrhea, nausea and vomiting.  Endocrine: Negative for hot flashes.  Genitourinary:  Negative for difficulty urinating.   Musculoskeletal:  Negative for arthralgias.  Skin:  Negative for itching and rash.  Neurological:  Negative for dizziness, extremity weakness, headaches and  numbness.  Hematological:  Negative for adenopathy. Does not bruise/bleed easily.  Psychiatric/Behavioral:  Negative for depression. The patient is not nervous/anxious.       PHYSICAL EXAMINATION    Vitals:   05/17/23 0929  BP: 134/78  Pulse: 77  Resp: 17  Temp: 98.4 F (36.9 C)  SpO2: 100%    Physical Exam Constitutional:      General: She is not in acute distress.    Appearance: Normal appearance. She is not toxic-appearing.  HENT:     Head: Normocephalic and atraumatic.     Mouth/Throat:     Mouth: Mucous membranes are moist.     Pharynx: Oropharynx is clear. No oropharyngeal exudate or posterior oropharyngeal erythema.  Eyes:     General: No scleral icterus. Cardiovascular:     Rate and Rhythm: Normal rate and regular rhythm.     Pulses: Normal pulses.     Heart sounds: Normal heart sounds.  Pulmonary:     Effort: Pulmonary effort is normal.     Breath sounds: Normal breath sounds.  Chest:     Comments: Right breast s/p mastectomy, no nodule, mass or sign of local recurrence noted on todays exam.  There is some thickness in the right axillary tail, but no definite nodularity or mass noted Abdominal:     General: Abdomen is flat. Bowel sounds are normal. There is no distension.     Palpations: Abdomen is soft.     Tenderness: There is no abdominal tenderness.  Musculoskeletal:        General: No swelling.     Cervical back: Neck supple.  Lymphadenopathy:     Cervical: No cervical adenopathy.     Upper Body:     Right upper body: No supraclavicular or axillary adenopathy.     Left upper body: No  supraclavicular or axillary adenopathy.  Skin:    General: Skin is warm and dry.     Findings: No rash.  Neurological:     General: No focal deficit present.     Mental Status: She is alert.  Psychiatric:        Mood and Affect: Mood normal.        Behavior: Behavior normal.     LABORATORY DATA:  CBC    Component Value Date/Time   WBC 4.4 05/18/2023 1527   WBC 4.0 01/19/2023 0918   RBC 4.14 05/18/2023 1527   HGB 12.2 05/18/2023 1527   HGB 13.5 10/12/2013 1510   HGB 12.8 08/13/2009 0946   HCT 36.0 05/18/2023 1527   HCT 40.5 10/12/2013 1510   HCT 36.8 08/13/2009 0946   PLT 257 05/18/2023 1527   PLT 278 10/12/2013 1510   PLT 271 08/13/2009 0946   MCV 87.0 05/18/2023 1527   MCV 87 10/12/2013 1510   MCV 87.0 08/13/2009 0946   MCH 29.5 05/18/2023 1527   MCHC 33.9 05/18/2023 1527   RDW 13.1 05/18/2023 1527   RDW 13.6 10/12/2013 1510   RDW 13.8 08/13/2009 0946   LYMPHSABS 1.9 05/18/2023 1527   LYMPHSABS 1.5 08/13/2009 0946   MONOABS 0.3 05/18/2023 1527   MONOABS 0.2 08/13/2009 0946   EOSABS 0.1 05/18/2023 1527   EOSABS 0.1 08/13/2009 0946   BASOSABS 0.0 05/18/2023 1527   BASOSABS 0.0 08/13/2009 0946    CMP     Component Value Date/Time   NA 139 05/18/2023 1527   NA 146 (H) 01/20/2015 1457   NA 143 10/12/2013 1510   K 4.7 05/18/2023 1527  K 3.9 10/12/2013 1510   CL 102 05/18/2023 1527   CL 109 (H) 10/12/2013 1510   CO2 31 05/18/2023 1527   CO2 29 10/12/2013 1510   GLUCOSE 75 05/18/2023 1527   GLUCOSE 92 10/12/2013 1510   BUN 19 05/18/2023 1527   BUN 17 01/20/2015 1457   BUN 16 10/12/2013 1510   CREATININE 1.26 (H) 05/18/2023 1527   CREATININE 1.21 (H) 01/19/2023 0918   CALCIUM 9.6 05/18/2023 1527   CALCIUM 9.4 10/12/2013 1510   PROT 7.2 05/18/2023 1527   PROT 6.8 01/20/2015 1457   PROT 7.6 10/12/2013 1510   ALBUMIN 4.3 05/18/2023 1527   ALBUMIN 4.1 01/20/2015 1457   ALBUMIN 3.9 10/12/2013 1510   AST 13 (L) 05/18/2023 1527   ALT 11 05/18/2023 1527    ALT 22 10/12/2013 1510   ALKPHOS 55 05/18/2023 1527   ALKPHOS 67 10/12/2013 1510   BILITOT 0.7 05/18/2023 1527   GFRNONAA 46 (L) 05/18/2023 1527   GFRNONAA 56 (L) 10/12/2013 1510   GFRAA >60 01/24/2018 2159   GFRAA >60 10/12/2013 1510     ASSESSMENT and THERAPY PLAN:   Malignant neoplasm of central portion of left breast in female, estrogen receptor positive (HCC) 02/02/2016: Right simple mastectomy: Grade 2 IDC with DCIS 1.5 cm, margins -0/3 lymph nodes, T1b N0 stage IA, ER 100%, PR 90%, Ki-67 10%, HER-2 negative ratio 1.28 Oncotype DX score 11:7% risk of recurrence (2009 right breast cancer treated with lumpectomy radiation and 5 years of antiestrogen therapy)   Current treatment: Letrozole  2.5 mg daily started 04/2016 switched to exemestane  05/26/2016 switched to anastrozole , switched to letrozole  03/18/2021   Breast cancer recurrence 1. Mammogram left breast: With ultrasound 02/02/2021: 0.6 cm irregular hypoechoic mass: Biopsy: Grade 2 invasive ductal carcinoma ER 100%, PR 0%, HER2 equivocal, FISH negative   Treatment plan: Left Mastectomy : Grade 2 IDC 1.1 cm, 0/8 LN neg, ER 100%, PR: 0%, ki 67: 15%, HER 2 neg Followed by adjuvant antiestrogen therapy.   Letrozole .   Breast cancer Breast cancer with prior surgeries, currently on letrozole . No new lumps, but soft tissue thickening noted. CT chest planned for further evaluation. - Continue letrozole  daily. - Order CT chest to evaluate pain and soft tissue thickening. - Consider ultrasound if CT scan is negative.  Pain in right arm Intermittent right arm pain, possibly due to lymphedema or soft tissue issues. Tylenol  recommended; avoid NSAIDs due to renal function. - Use Tylenol  for pain management as needed. - Avoid ibuprofen  or Aleve  due to borderline renal function. - Contact provider if pain worsens or becomes unmanageable with Tylenol .  Lymphedema Possible right arm lymphedema post-lymph node removal. No prior history.  Increased water intake advised for renal function optimization. - Increase water intake to 4-5 bottles a day to optimize renal function for potential contrast use in CT scan.  F/u in 2 weeks for evaluation to review CT scan results.      All questions were answered. The patient knows to call the clinic with any problems, questions or concerns. We can certainly see the patient much sooner if necessary.  Total encounter time:30 minutes*in face-to-face visit time, chart review, lab review, care coordination, order entry, and documentation of the encounter time.    Alwin Baars, NP 05/18/23 11:37 PM Medical Oncology and Hematology [facility] [address] [place], [place] [address] Tel. [phone]    Fax. [phone]  *Total Encounter Time as defined by the Centers for Medicare and Medicaid Services includes,  in addition to the face-to-face time of a patient visit (documented in the note above) non-face-to-face time: obtaining and reviewing outside history, ordering and reviewing medications, tests or procedures, care coordination (communications with other health care professionals or caregivers) and documentation in the medical record.

## 2023-05-17 NOTE — Telephone Encounter (Signed)
 Called pt to make aware of lab and CT appt at [facility] hospital. Pt is aware of time and date of scan. No prep required. Pt verbalized understanding.

## 2023-05-18 ENCOUNTER — Inpatient Hospital Stay: Attending: Adult Health

## 2023-05-18 ENCOUNTER — Ambulatory Visit (HOSPITAL_COMMUNITY)
Admission: RE | Admit: 2023-05-18 | Discharge: 2023-05-18 | Disposition: A | Discharge status: Home / Self Care | Source: Ambulatory Visit | Attending: Adult Health | Admitting: Adult Health

## 2023-05-18 DIAGNOSIS — Z9012 Acquired absence of left breast and nipple: Secondary | ICD-10-CM | POA: Insufficient documentation

## 2023-05-18 DIAGNOSIS — C50112 Malignant neoplasm of central portion of left female breast: Secondary | ICD-10-CM | POA: Insufficient documentation

## 2023-05-18 DIAGNOSIS — Z803 Family history of malignant neoplasm of breast: Secondary | ICD-10-CM | POA: Diagnosis not present

## 2023-05-18 DIAGNOSIS — Z1721 Progesterone receptor positive status: Secondary | ICD-10-CM | POA: Insufficient documentation

## 2023-05-18 DIAGNOSIS — E119 Type 2 diabetes mellitus without complications: Secondary | ICD-10-CM | POA: Insufficient documentation

## 2023-05-18 DIAGNOSIS — Z833 Family history of diabetes mellitus: Secondary | ICD-10-CM | POA: Diagnosis not present

## 2023-05-18 DIAGNOSIS — Z90710 Acquired absence of both cervix and uterus: Secondary | ICD-10-CM | POA: Diagnosis not present

## 2023-05-18 DIAGNOSIS — Z9221 Personal history of antineoplastic chemotherapy: Secondary | ICD-10-CM | POA: Insufficient documentation

## 2023-05-18 DIAGNOSIS — Z1732 Human epidermal growth factor receptor 2 negative status: Secondary | ICD-10-CM | POA: Insufficient documentation

## 2023-05-18 DIAGNOSIS — Z170 Estrogen receptor positive status [ER+]: Secondary | ICD-10-CM | POA: Insufficient documentation

## 2023-05-18 DIAGNOSIS — Z79811 Long term (current) use of aromatase inhibitors: Secondary | ICD-10-CM | POA: Diagnosis not present

## 2023-05-18 DIAGNOSIS — R079 Chest pain, unspecified: Secondary | ICD-10-CM | POA: Insufficient documentation

## 2023-05-18 DIAGNOSIS — Z823 Family history of stroke: Secondary | ICD-10-CM | POA: Insufficient documentation

## 2023-05-18 DIAGNOSIS — Z8249 Family history of ischemic heart disease and other diseases of the circulatory system: Secondary | ICD-10-CM | POA: Diagnosis not present

## 2023-05-18 DIAGNOSIS — C50912 Malignant neoplasm of unspecified site of left female breast: Secondary | ICD-10-CM

## 2023-05-18 DIAGNOSIS — Z800 Family history of malignant neoplasm of digestive organs: Secondary | ICD-10-CM | POA: Diagnosis not present

## 2023-05-18 DIAGNOSIS — Z79899 Other long term (current) drug therapy: Secondary | ICD-10-CM | POA: Diagnosis not present

## 2023-05-18 DIAGNOSIS — I10 Essential (primary) hypertension: Secondary | ICD-10-CM | POA: Diagnosis not present

## 2023-05-18 DIAGNOSIS — E785 Hyperlipidemia, unspecified: Secondary | ICD-10-CM | POA: Insufficient documentation

## 2023-05-18 DIAGNOSIS — Z801 Family history of malignant neoplasm of trachea, bronchus and lung: Secondary | ICD-10-CM | POA: Diagnosis not present

## 2023-05-18 LAB — CBC WITH DIFFERENTIAL (CANCER CENTER ONLY)
Abs Immature Granulocytes: 0.01 10*3/uL (ref 0.00–0.07)
Basophils Absolute: 0 10*3/uL (ref 0.0–0.1)
Basophils Relative: 1 %
Eosinophils Absolute: 0.1 10*3/uL (ref 0.0–0.5)
Eosinophils Relative: 3 %
HCT: 36 % (ref 36.0–46.0)
Hemoglobin: 12.2 g/dL (ref 12.0–15.0)
Immature Granulocytes: 0 %
Lymphocytes Relative: 43 %
Lymphs Abs: 1.9 10*3/uL (ref 0.7–4.0)
MCH: 29.5 pg (ref 26.0–34.0)
MCHC: 33.9 g/dL (ref 30.0–36.0)
MCV: 87 fL (ref 80.0–100.0)
Monocytes Absolute: 0.3 10*3/uL (ref 0.1–1.0)
Monocytes Relative: 8 %
Neutro Abs: 2 10*3/uL (ref 1.7–7.7)
Neutrophils Relative %: 45 %
Platelet Count: 257 10*3/uL (ref 150–400)
RBC: 4.14 MIL/uL (ref 3.87–5.11)
RDW: 13.1 % (ref 11.5–15.5)
WBC Count: 4.4 10*3/uL (ref 4.0–10.5)
nRBC: 0 % (ref 0.0–0.2)

## 2023-05-18 LAB — CMP (CANCER CENTER ONLY)
ALT: 11 U/L (ref 0–44)
AST: 13 U/L — ABNORMAL LOW (ref 15–41)
Albumin: 4.3 g/dL (ref 3.5–5.0)
Alkaline Phosphatase: 55 U/L (ref 38–126)
Anion gap: 6 (ref 5–15)
BUN: 19 mg/dL (ref 8–23)
CO2: 31 mmol/L (ref 22–32)
Calcium: 9.6 mg/dL (ref 8.9–10.3)
Chloride: 102 mmol/L (ref 98–111)
Creatinine: 1.26 mg/dL — ABNORMAL HIGH (ref 0.44–1.00)
GFR, Estimated: 46 mL/min — ABNORMAL LOW (ref >60)
Glucose, Bld: 75 mg/dL (ref 70–99)
Potassium: 4.7 mmol/L (ref 3.5–5.1)
Sodium: 139 mmol/L (ref 135–145)
Total Bilirubin: 0.7 mg/dL (ref 0.0–1.2)
Total Protein: 7.2 g/dL (ref 6.5–8.1)

## 2023-05-18 MED ORDER — IOHEXOL 300 MG/ML  SOLN
60.0000 mL | Freq: Once | INTRAMUSCULAR | Status: AC | PRN
  Administered 2023-05-18: 60 mL via INTRAVENOUS

## 2023-05-18 MED ORDER — SODIUM CHLORIDE (PF) 0.9 % IJ SOLN
INTRAMUSCULAR | Status: AC
  Filled 2023-05-18: qty 50

## 2023-05-18 NOTE — Assessment & Plan Note (Signed)
 02/02/2016: Right simple mastectomy: Grade 2 IDC with DCIS 1.5 cm, margins -0/3 lymph nodes, T1b N0 stage IA, ER 100%, PR 90%, Ki-67 10%, HER-2 negative ratio 1.28 Oncotype DX score 11:7% risk of recurrence (2009 right breast cancer treated with lumpectomy radiation and 5 years of antiestrogen therapy)   Current treatment: Letrozole  2.5 mg daily started 04/2016 switched to exemestane  05/26/2016 switched to anastrozole , switched to letrozole  03/18/2021   Breast cancer recurrence 1. Mammogram left breast: With ultrasound 02/02/2021: 0.6 cm irregular hypoechoic mass: Biopsy: Grade 2 invasive ductal carcinoma ER 100%, PR 0%, HER2 equivocal, FISH negative   Treatment plan: Left Mastectomy : Grade 2 IDC 1.1 cm, 0/8 LN neg, ER 100%, PR: 0%, ki 67: 15%, HER 2 neg Followed by adjuvant antiestrogen therapy.   Letrozole .   Breast cancer Breast cancer with prior surgeries, currently on letrozole . No new lumps, but soft tissue thickening noted. CT chest planned for further evaluation. - Continue letrozole  daily. - Order CT chest to evaluate pain and soft tissue thickening. - Consider ultrasound if CT scan is negative.  Pain in right arm Intermittent right arm pain, possibly due to lymphedema or soft tissue issues. Tylenol  recommended; avoid NSAIDs due to renal function. - Use Tylenol  for pain management as needed. - Avoid ibuprofen  or Aleve  due to borderline renal function. - Contact provider if pain worsens or becomes unmanageable with Tylenol .  Lymphedema Possible right arm lymphedema post-lymph node removal. No prior history. Increased water intake advised for renal function optimization. - Increase water intake to 4-5 bottles a day to optimize renal function for potential contrast use in CT scan.  F/u in 2 weeks for evaluation to review CT scan results.

## 2023-05-30 ENCOUNTER — Ambulatory Visit: Payer: Self-pay | Admitting: *Deleted

## 2023-05-30 NOTE — Telephone Encounter (Signed)
-----   Message from Conway Dennis sent at 05/29/2023  3:41 PM EDT ----- Good news ct chest negative for recurrence, please make sure patient has the results. ----- Message ----- From: Interface, Rad Results In Sent: 05/25/2023   9:30 AM EDT To: Percival Brace, NP

## 2023-05-30 NOTE — Telephone Encounter (Signed)
Per Lindsey Causey, NP, called pt with message below. Pt verbalized understanding.  

## 2023-05-31 ENCOUNTER — Encounter: Payer: Self-pay | Admitting: Adult Health

## 2023-05-31 ENCOUNTER — Inpatient Hospital Stay: Admitting: Adult Health

## 2023-05-31 ENCOUNTER — Telehealth: Payer: Self-pay

## 2023-05-31 DIAGNOSIS — C50112 Malignant neoplasm of central portion of left female breast: Secondary | ICD-10-CM

## 2023-05-31 DIAGNOSIS — Z170 Estrogen receptor positive status [ER+]: Secondary | ICD-10-CM

## 2023-05-31 NOTE — Progress Notes (Signed)
 Called patient to answer any questions about CT chest and discuss PT.  LMOM.  Patient did receive good news results yesterday from nursing.    Alwin Baars, NP 05/31/23 11:37 AM Medical Oncology and Hematology [facility] [address] [place], [place] [address] Tel. [phone]    Fax. [phone]

## 2023-05-31 NOTE — Telephone Encounter (Signed)
 Pt scheduled for phone visit with Alwin Baars, NP today. Loris Ros attempted to call pt twice for visit but she did not answer. Pt returned call stating her phone volume was turned down, therefore not hearing the phone ring. Pt was offered r/s appt for tomorrow at 1320 and she accepted. Rescheduled.

## 2023-06-01 ENCOUNTER — Inpatient Hospital Stay (HOSPITAL_BASED_OUTPATIENT_CLINIC_OR_DEPARTMENT_OTHER): Admitting: Adult Health

## 2023-06-01 ENCOUNTER — Encounter: Payer: Self-pay | Admitting: Adult Health

## 2023-06-01 DIAGNOSIS — Z79899 Other long term (current) drug therapy: Secondary | ICD-10-CM

## 2023-06-01 DIAGNOSIS — Z801 Family history of malignant neoplasm of trachea, bronchus and lung: Secondary | ICD-10-CM

## 2023-06-01 DIAGNOSIS — E119 Type 2 diabetes mellitus without complications: Secondary | ICD-10-CM

## 2023-06-01 DIAGNOSIS — Z1721 Progesterone receptor positive status: Secondary | ICD-10-CM

## 2023-06-01 DIAGNOSIS — E785 Hyperlipidemia, unspecified: Secondary | ICD-10-CM

## 2023-06-01 DIAGNOSIS — Z9221 Personal history of antineoplastic chemotherapy: Secondary | ICD-10-CM

## 2023-06-01 DIAGNOSIS — Z833 Family history of diabetes mellitus: Secondary | ICD-10-CM

## 2023-06-01 DIAGNOSIS — Z800 Family history of malignant neoplasm of digestive organs: Secondary | ICD-10-CM

## 2023-06-01 DIAGNOSIS — Z823 Family history of stroke: Secondary | ICD-10-CM

## 2023-06-01 DIAGNOSIS — Z803 Family history of malignant neoplasm of breast: Secondary | ICD-10-CM

## 2023-06-01 DIAGNOSIS — Z170 Estrogen receptor positive status [ER+]: Secondary | ICD-10-CM | POA: Diagnosis not present

## 2023-06-01 DIAGNOSIS — Z9012 Acquired absence of left breast and nipple: Secondary | ICD-10-CM

## 2023-06-01 DIAGNOSIS — Z8249 Family history of ischemic heart disease and other diseases of the circulatory system: Secondary | ICD-10-CM

## 2023-06-01 DIAGNOSIS — Z1732 Human epidermal growth factor receptor 2 negative status: Secondary | ICD-10-CM | POA: Diagnosis not present

## 2023-06-01 DIAGNOSIS — Z79811 Long term (current) use of aromatase inhibitors: Secondary | ICD-10-CM

## 2023-06-01 DIAGNOSIS — R079 Chest pain, unspecified: Secondary | ICD-10-CM

## 2023-06-01 DIAGNOSIS — C50112 Malignant neoplasm of central portion of left female breast: Secondary | ICD-10-CM | POA: Diagnosis not present

## 2023-06-01 DIAGNOSIS — Z90710 Acquired absence of both cervix and uterus: Secondary | ICD-10-CM

## 2023-06-01 DIAGNOSIS — I10 Essential (primary) hypertension: Secondary | ICD-10-CM

## 2023-06-01 NOTE — Progress Notes (Signed)
 [facility] Cancer Follow up:    Carrie Cirri, NP [address] [place] [address]   DIAGNOSIS:  Cancer Staging  Malignant neoplasm of central portion of left breast in female, estrogen receptor positive (HCC) Staging form: Breast, AJCC 8th Edition - Pathologic: pT1c, pN0, cM0 - Unsigned  I connected with Stacia Dynes  on 06/04/23 at  1:20 PM EDT by telephone and verified that I am speaking with the correct person using two identifiers.  I discussed the limitations, risks, security and privacy concerns of performing an evaluation and management service by telephone and the availability of in person appointments.  I also discussed with the patient that there may be a patient responsible charge related to this service. The patient expressed understanding and agreed to proceed.  Patient location: home Provider location:  [facility] office   SUMMARY OF ONCOLOGIC HISTORY: Oncology History  Malignant neoplasm of central portion of left breast in female, estrogen receptor positive (HCC)  12/18/2007 Initial Diagnosis   Right breast cancer treated with lumpectomy followed by radiation and 5 years of antiestrogen therapy with aromatase inhibitor   02/02/2016 Relapse/Recurrence   Right breast biopsy 6:00 position: IDC with DCIS grade 2, ER 100%, PR 90%, Ki-67 10%, HER-2 negative ratio 1.28, screening detected right breast lumps 0.5 cm and 0.3 cm, T1a N0 stage IA clinical stage   03/18/2016 Surgery   Right simple mastectomy: Grade 2 IDC with DCIS 1.5 cm, margins -0/3 lymph nodes, T1b N0 stage IA, ER 100%, PR 90%, Ki-67 10%, HER-2 negative ratio 1.28   03/18/2016 Oncotype testing   11/7%   04/2016 -  Anti-estrogen oral therapy   Letrozole  2.5mg  daily switch to exemestane  25 mg daily May 2018 switched to anastrozole  1 mg daily due to cost   03/10/2021 Surgery   Left Mastectomy : Grade 2 IDC 1.1 cm, 0/8 LN neg, ER 100%, PR: 0%, ki 67: 15%, HER 2 neg      04/2021 -  Anti-estrogen oral  therapy   Letrozole  daily    Genetic Testing   [facility] CancerNext-Expanded Panel was Negative. Report date is 08/09/2021.  The CancerNext-Expanded gene panel offered by [facility] and includes sequencing, rearrangement, and RNA analysis for the following 77 genes: AIP, ALK, APC, ATM, AXIN2, BAP1, BARD1, BLM, BMPR1A, BRCA1, BRCA2, BRIP1, CDC73, CDH1, CDK4, CDKN1B, CDKN2A, CHEK2, CTNNA1, DICER1, FANCC, FH, FLCN, GALNT12, KIF1B, LZTR1, MAX, MEN1, MET, MLH1, MSH2, MSH3, MSH6, MUTYH, NBN, NF1, NF2, NTHL1, PALB2, PHOX2B, PMS2, POT1, PRKAR1A, PTCH1, PTEN, RAD51C, RAD51D, RB1, RECQL, RET, SDHA, SDHAF2, SDHB, SDHC, SDHD, SMAD4, SMARCA4, SMARCB1, SMARCE1, STK11, SUFU, TMEM127, TP53, TSC1, TSC2, VHL and XRCC2 (sequencing and deletion/duplication); EGFR, EGLN1, HOXB13, KIT, MITF, PDGFRA, POLD1, and POLE (sequencing only); EPCAM and GREM1 (deletion/duplication only).    Malignant neoplasm of left breast in female, estrogen receptor positive (HCC) (Resolved)  02/18/2021 Initial Diagnosis   Malignant neoplasm of left breast in female, estrogen receptor positive (HCC)   03/10/2021 Cancer Staging   Staging form: Breast, AJCC 8th Edition - Pathologic stage from 03/10/2021: Stage IA (pT1c, pN0, cM0, G2, ER+, PR-, HER2-) - Signed by Percival Brace, NP on 06/21/2021 Stage prefix: Initial diagnosis Histologic grading system: 3 grade system    Genetic Testing   [facility] CancerNext-Expanded Panel was Negative. Report date is 08/09/2021.  The CancerNext-Expanded gene panel offered by [facility] and includes sequencing, rearrangement, and RNA analysis for the following 77 genes: AIP, ALK, APC, ATM, AXIN2, BAP1, BARD1, BLM, BMPR1A, BRCA1,  BRCA2, BRIP1, CDC73, CDH1, CDK4, CDKN1B, CDKN2A, CHEK2, CTNNA1, DICER1, FANCC, FH, FLCN, GALNT12, KIF1B, LZTR1, MAX, MEN1, MET, MLH1, MSH2, MSH3, MSH6, MUTYH, NBN, NF1, NF2, NTHL1, PALB2, PHOX2B, PMS2, POT1, PRKAR1A, PTCH1, PTEN, RAD51C, RAD51D, RB1, RECQL, RET, SDHA, SDHAF2, SDHB,  SDHC, SDHD, SMAD4, SMARCA4, SMARCB1, SMARCE1, STK11, SUFU, TMEM127, TP53, TSC1, TSC2, VHL and XRCC2 (sequencing and deletion/duplication); EGFR, EGLN1, HOXB13, KIT, MITF, PDGFRA, POLD1, and POLE (sequencing only); EPCAM and GREM1 (deletion/duplication only).      CURRENT THERAPY: Letrozole   INTERVAL HISTORY:  LAMANDA RUDDER 68 y.o. female returns for f/u since undergoing CT chest on first that was read on May 25, 2023.  She underwent this CT scan for right chest wall pain.  There were no signs of breast cancer recurrence in the chest or underlying the chest wall.  She notes that the pain in her chest wall has improved since her last appointment with me.  During our visit we discussed her labs as she is worried about her kidney function and what that means in regard to her metformin  and other medications.  She sees nephrology who added to her care teams and forwarded her lab results.   Patient Active Problem List   Diagnosis Date Noted   Aortic atherosclerosis (HCC) 06/17/2021   Chronic foot pain, right 06/17/2021   Class 1 obesity due to excess calories with body mass index (BMI) of 34.0 to 34.9 in adult 08/21/2020   GERD (gastroesophageal reflux disease) 11/30/2017   Intractable episodic cluster headache 12/04/2014   Depression with somatization 12/04/2014   HTN (hypertension) 04/24/2013   HLD (hyperlipidemia) 04/24/2013   DM2 (diabetes mellitus, type 2) (HCC) 04/24/2013   Malignant neoplasm of central portion of left breast in female, estrogen receptor positive (HCC) 12/18/2007    has no known allergies.  MEDICAL HISTORY: Past Medical History:  Diagnosis Date   Breast cancer (HCC) 01/2021   left breast IDC   Breast cancer, right breast (HCC) 12/2007   a. s/p chemo, radiation, and lumpectomy with negative sentinel lymph node biopsy    Complication of anesthesia    woke up during colonoscopy   Depression    Diabetes mellitus without complication (HCC)    GERD (gastroesophageal  reflux disease)    Hyperlipidemia    Hypertension    resistant, negative renal Dopplers and negative CT angiogram   Migraines    Obesity     SURGICAL HISTORY: Past Surgical History:  Procedure Laterality Date   ABDOMINAL HYSTERECTOMY     BREAST LUMPECTOMY     CYST EXCISION     FOOT SURGERY     MASTECTOMY Right    MASTECTOMY W/ SENTINEL NODE BIOPSY Right 03/18/2016   Procedure: RIGHT MASTECTOMY WITH RIGHT AXILLARY SENTINEL LYMPH NODE BIOPSY;  Surgeon: Juanita Norlander, MD;  Location: [facility] OR;  Service: General;  Laterality: Right;   MASTECTOMY W/ SENTINEL NODE BIOPSY Left 03/10/2021   Procedure: LEFT MASTECTOMY WITH LEFT SENTINEL LYMPH NODE BIOPSY;  Surgeon: Sim Dryer, MD;  Location: [facility];  Service: General;  Laterality: Left;   ovary tumor     vocal cord nodules      SOCIAL HISTORY: Social History   Socioeconomic History   Marital status: Married    Spouse name: Haddie Bruhl   Number of children: 3   Years of education: 12+   Highest education level: Some college, no degree  Occupational History   Occupation: Disabled  Tobacco Use   Smoking status: Never   Smokeless tobacco:  Never  Vaping Use   Vaping status: Never Used  Substance and Sexual Activity   Alcohol use: No    Alcohol/week: 0.0 standard drinks of alcohol   Drug use: No   Sexual activity: Yes  Other Topics Concern   Not on file  Social History Narrative   Lives at home with husband.   Caffeine  use: 1-2 drinks per month         Epworth Sleepiness Scale = 15 (as of 01/01/15)   Social Drivers of Health   Financial Resource Strain: Low Risk  (04/12/2023)   Overall Financial Resource Strain (CARDIA)    Difficulty of Paying Living Expenses: Not hard at all  Food Insecurity: No Food Insecurity (04/12/2023)   Hunger Vital Sign    Worried About Running Out of Food in the Last Year: Never true    Ran Out of Food in the Last Year: Never true  Transportation Needs: No Transportation Needs  (04/12/2023)   PRAPARE - Administrator, Civil Service (Medical): No    Lack of Transportation (Non-Medical): No  Physical Activity: Insufficiently Active (04/12/2023)   Exercise Vital Sign    Days of Exercise per Week: 2 days    Minutes of Exercise per Session: 20 min  Stress: No Stress Concern Present (04/12/2023)   [employer] of Occupational Health - Occupational Stress Questionnaire    Feeling of Stress : Not at all  Social Connections: Socially Integrated (04/12/2023)   Social Connection and Isolation Panel [NHANES]    Frequency of Communication with Friends and Family: More than three times a week    Frequency of Social Gatherings with Friends and Family: Once a week    Attends Religious Services: More than 4 times per year    Active Member of [employer] or Organizations: Yes    Attends Engineer, structural: More than 4 times per year    Marital Status: Married  Catering manager Violence: Not At Risk (07/29/2022)   Humiliation, Afraid, Rape, and Kick questionnaire    Fear of Current or Ex-Partner: No    Emotionally Abused: No    Physically Abused: No    Sexually Abused: No    FAMILY HISTORY: Family History  Problem Relation Age of Onset   Stroke Mother    Colon cancer Father 76   Heart disease Father    Asthma Father    Lung cancer Sister    Colon cancer Brother 75   Diabetes Brother    Breast cancer Niece        dx. <50   Breast cancer Niece        dx. <50   Breast cancer Niece        dx. >50   Esophageal cancer Neg Hx    Rectal cancer Neg Hx    Stomach cancer Neg Hx     Review of Systems  Constitutional:  Negative for appetite change, chills, fatigue, fever and unexpected weight change.  HENT:   Negative for hearing loss, lump/mass and trouble swallowing.   Eyes:  Negative for eye problems and icterus.  Respiratory:  Negative for chest tightness, cough and shortness of breath.   Cardiovascular:  Negative for chest pain, leg swelling and  palpitations.  Gastrointestinal:  Negative for abdominal distention, abdominal pain, constipation, diarrhea, nausea and vomiting.  Endocrine: Negative for hot flashes.  Genitourinary:  Negative for difficulty urinating.   Musculoskeletal:  Negative for arthralgias.  Skin:  Negative for itching and rash.  Neurological:  Negative for dizziness, extremity weakness, headaches and numbness.  Hematological:  Negative for adenopathy. Does not bruise/bleed easily.  Psychiatric/Behavioral:  Negative for depression. The patient is not nervous/anxious.       PHYSICAL EXAMINATION  The patient sounds well.  She is in no apparent distress.  Mood and behavior are normal.  LABORATORY DATA:  CBC    Component Value Date/Time   WBC 4.4 05/18/2023 1527   WBC 4.0 01/19/2023 0918   RBC 4.14 05/18/2023 1527   HGB 12.2 05/18/2023 1527   HGB 13.5 10/12/2013 1510   HGB 12.8 08/13/2009 0946   HCT 36.0 05/18/2023 1527   HCT 40.5 10/12/2013 1510   HCT 36.8 08/13/2009 0946   PLT 257 05/18/2023 1527   PLT 278 10/12/2013 1510   PLT 271 08/13/2009 0946   MCV 87.0 05/18/2023 1527   MCV 87 10/12/2013 1510   MCV 87.0 08/13/2009 0946   MCH 29.5 05/18/2023 1527   MCHC 33.9 05/18/2023 1527   RDW 13.1 05/18/2023 1527   RDW 13.6 10/12/2013 1510   RDW 13.8 08/13/2009 0946   LYMPHSABS 1.9 05/18/2023 1527   LYMPHSABS 1.5 08/13/2009 0946   MONOABS 0.3 05/18/2023 1527   MONOABS 0.2 08/13/2009 0946   EOSABS 0.1 05/18/2023 1527   EOSABS 0.1 08/13/2009 0946   BASOSABS 0.0 05/18/2023 1527   BASOSABS 0.0 08/13/2009 0946    CMP     Component Value Date/Time   NA 139 05/18/2023 1527   NA 146 (H) 01/20/2015 1457   NA 143 10/12/2013 1510   K 4.7 05/18/2023 1527   K 3.9 10/12/2013 1510   CL 102 05/18/2023 1527   CL 109 (H) 10/12/2013 1510   CO2 31 05/18/2023 1527   CO2 29 10/12/2013 1510   GLUCOSE 75 05/18/2023 1527   GLUCOSE 92 10/12/2013 1510   BUN 19 05/18/2023 1527   BUN 17 01/20/2015 1457   BUN 16  10/12/2013 1510   CREATININE 1.26 (H) 05/18/2023 1527   CREATININE 1.21 (H) 01/19/2023 0918   CALCIUM 9.6 05/18/2023 1527   CALCIUM 9.4 10/12/2013 1510   PROT 7.2 05/18/2023 1527   PROT 6.8 01/20/2015 1457   PROT 7.6 10/12/2013 1510   ALBUMIN 4.3 05/18/2023 1527   ALBUMIN 4.1 01/20/2015 1457   ALBUMIN 3.9 10/12/2013 1510   AST 13 (L) 05/18/2023 1527   ALT 11 05/18/2023 1527   ALT 22 10/12/2013 1510   ALKPHOS 55 05/18/2023 1527   ALKPHOS 67 10/12/2013 1510   BILITOT 0.7 05/18/2023 1527   GFRNONAA 46 (L) 05/18/2023 1527   GFRNONAA 56 (L) 10/12/2013 1510   GFRAA >60 01/24/2018 2159   GFRAA >60 10/12/2013 1510     ASSESSMENT and THERAPY PLAN:   Malignant neoplasm of central portion of left breast in female, estrogen receptor positive (HCC) Clarann is a 68 year old woman with h/o stage IA left breast invasive ductal carcinoma ER positive, s/p mastectomy followed by letrozole  daily that she began in 04/2021.  Stage IA left breast cancer: no signs of recurrence.  Continue letrozole  Chest wall pain: CT chest negative for recurrence.  Chest wall pain has resolved therefore referral to PT not needed at this time. Concern over kidney function: I faxed her labs to her nephrologist for evaluation and requested they reach out to her if any additional testing or f/u is needed prior to her regularly scheduled f/u with their office.   RTC in 12/2023 for f/u with Dr. Gudena, or sooner if needed.  The patient was provided an opportunity to ask questions and all were answered. The patient agreed with the plan and demonstrated an understanding of the instructions.   The patient was advised to call back or seek an in-person evaluation if the symptoms worsen or if the condition fails to improve as anticipated.   I provided 15 minutes of non face-to-face telephone visit time during this encounter, and > 50% was spent counseling as documented under my assessment & plan.    Alwin Baars, NP  06/04/23 5:22 PM Medical Oncology and Hematology [facility] [address] [place], [place] [address] Tel. [phone]    Fax. [phone]  *Total Encounter Time as defined by the Centers for Medicare and Medicaid Services includes, in addition to the face-to-face time of a patient visit (documented in the note above) non-face-to-face time: obtaining and reviewing outside history, ordering and reviewing medications, tests or procedures, care coordination (communications with other health care professionals or caregivers) and documentation in the medical record.

## 2023-06-04 NOTE — Assessment & Plan Note (Addendum)
 Carrie Singh is a 68 year old woman with h/o stage IA left breast invasive ductal carcinoma ER positive, s/p mastectomy followed by letrozole  daily that she began in 04/2021.  Stage IA left breast cancer: no signs of recurrence.  Continue letrozole  Chest wall pain: CT chest negative for recurrence.  Chest wall pain has resolved therefore referral to PT not needed at this time. Concern over kidney function: I faxed her labs to her nephrologist for evaluation and requested they reach out to her if any additional testing or f/u is needed prior to her regularly scheduled f/u with their office.   RTC in 12/2023 for f/u with Dr. Gudena, or sooner if needed.

## 2023-06-30 ENCOUNTER — Other Ambulatory Visit: Payer: Self-pay | Admitting: Internal Medicine

## 2023-07-03 NOTE — Telephone Encounter (Signed)
 Requested Prescriptions  Pending Prescriptions Disp Refills   pravastatin  (PRAVACHOL ) 40 MG tablet [Pharmacy Med Name: Pravastatin  Sodium 40 MG Oral Tablet] 100 tablet 0    Sig: TAKE 1 TABLET BY MOUTH DAILY     Cardiovascular:  Antilipid - Statins Failed - 07/03/2023  9:55 AM      Failed - Valid encounter within last 12 months    Recent Outpatient Visits           2 months ago Carpal tunnel syndrome on left   [facility] [place], Rankin Buzzard, NP       Future Appointments             In 1 month [place], Rankin Buzzard, NP [facility]            Failed - Lipid Panel in normal range within the last 12 months    Cholesterol, Total  Date Value Ref Range Status  10/17/2014 179 100 - 199 mg/dL Final   Cholesterol  Date Value Ref Range Status  01/19/2023 151 <200 mg/dL Final   LDL Cholesterol (Calc)  Date Value Ref Range Status  01/19/2023 73 mg/dL (calc) Final    Comment:    Reference range: <100 . Desirable range <100 mg/dL for primary prevention;   <70 mg/dL for patients with CHD or diabetic patients  with > or = 2 CHD risk factors. Aaron Aas LDL-C is now calculated using the Martin-Hopkins  calculation, which is a validated novel method providing  better accuracy than the Friedewald equation in the  estimation of LDL-C.  Melinda Sprawls et al. Erroll Heard. 1610;960(45): 2061-2068  (http://education.[facility].com/faq/FAQ164)    Direct LDL  Date Value Ref Range Status  09/05/2013 169.6 mg/dL Final    Comment:    Optimal:  <100 mg/dLNear or Above Optimal:  100-129 mg/dLBorderline High:  130-159 mg/dLHigh:  160-189 mg/dLVery High:  >190 mg/dL   HDL  Date Value Ref Range Status  01/19/2023 63 > OR = 50 mg/dL Final  40/98/1191 59 >47 mg/dL Final    Comment:    According to ATP-III Guidelines, HDL-C >59 mg/dL is considered a negative risk factor for CHD.    Triglycerides  Date Value Ref Range Status  01/19/2023 74 <150 mg/dL  Final         Passed - Patient is not pregnant       hydrALAZINE  (APRESOLINE ) 100 MG tablet [Pharmacy Med Name: hydrALAZINE  HCl 100 MG Oral Tablet] 300 tablet 0    Sig: TAKE 1 TABLET BY MOUTH 3 TIMES  DAILY     Cardiovascular:  Vasodilators Failed - 07/03/2023  9:55 AM      Failed - ANA Screen, Ifa, Serum in normal range and within 360 days    Anti Nuclear Antibody(ANA)  Date Value Ref Range Status  10/17/2014 Negative Negative Final         Failed - Valid encounter within last 12 months    Recent Outpatient Visits           2 months ago Carpal tunnel syndrome on left   [facility] [place], Rankin Buzzard, NP       Future Appointments             In 1 month Baity, Rankin Buzzard, NP  [facility]            Passed - HCT in normal range and within 360 days  HCT  Date Value Ref Range Status  05/18/2023 36.0 36.0 - 46.0 % Final  10/12/2013 40.5 35.0 - 47.0 % Final  08/13/2009 36.8 34.8 - 46.6 % Final         Passed - HGB in normal range and within 360 days    Hemoglobin  Date Value Ref Range Status  05/18/2023 12.2 12.0 - 15.0 g/dL Final   HGB  Date Value Ref Range Status  10/12/2013 13.5 12.0 - 16.0 g/dL Final  78/29/5621 30.8 11.6 - 15.9 g/dL Final         Passed - RBC in normal range and within 360 days    RBC  Date Value Ref Range Status  05/18/2023 4.14 3.87 - 5.11 MIL/uL Final         Passed - WBC in normal range and within 360 days    WBC  Date Value Ref Range Status  01/19/2023 4.0 3.8 - 10.8 Thousand/uL Final   WBC Count  Date Value Ref Range Status  05/18/2023 4.4 4.0 - 10.5 K/uL Final         Passed - PLT in normal range and within 360 days    Platelets  Date Value Ref Range Status  08/13/2009 271 145 - 400 10e3/uL Final   Platelet  Date Value Ref Range Status  10/12/2013 278 150 - 440 x10 3/mm 3 Final   Platelet Count  Date Value Ref Range Status  05/18/2023 257 150 - 400 K/uL  Final         Passed - Last BP in normal range    BP Readings from Last 1 Encounters:  05/17/23 134/78

## 2023-07-09 ENCOUNTER — Other Ambulatory Visit: Payer: Self-pay | Admitting: Internal Medicine

## 2023-07-09 DIAGNOSIS — I10 Essential (primary) hypertension: Secondary | ICD-10-CM

## 2023-07-11 NOTE — Telephone Encounter (Signed)
 Requested Prescriptions  Refused Prescriptions Disp Refills   amLODipine  (NORVASC ) 10 MG tablet [Pharmacy Med Name: amLODIPine  Besylate 10 MG Oral Tablet] 100 tablet 2    Sig: TAKE 1 TABLET BY MOUTH DAILY     Cardiovascular: Calcium Channel Blockers 2 Failed - 07/11/2023  2:52 PM      Failed - Valid encounter within last 6 months    Recent Outpatient Visits           2 months ago Carpal tunnel syndrome on left   [facility] [place], Angeline ORN, NP       Future Appointments             In 1 month [place], Angeline ORN, NP [facility]            Passed - Last BP in normal range    BP Readings from Last 1 Encounters:  05/17/23 134/78         Passed - Last Heart Rate in normal range    Pulse Readings from Last 1 Encounters:  05/17/23 77

## 2023-07-19 ENCOUNTER — Encounter: Payer: Self-pay | Admitting: Hematology and Oncology

## 2023-07-20 ENCOUNTER — Encounter: Payer: Self-pay | Admitting: Internal Medicine

## 2023-07-20 MED ORDER — REPAGLINIDE 0.5 MG PO TABS: 0.5000 mg | ORAL_TABLET | Freq: Three times a day (TID) | ORAL | 0 refills | Status: DC

## 2023-07-27 ENCOUNTER — Ambulatory Visit: Payer: Medicare Other | Admitting: Internal Medicine

## 2023-08-10 ENCOUNTER — Ambulatory Visit: Payer: Medicare Other

## 2023-08-10 VITALS — Ht 65.0 in | Wt 205.0 lb

## 2023-08-10 DIAGNOSIS — Z0000 Encounter for general adult medical examination without abnormal findings: Secondary | ICD-10-CM | POA: Diagnosis not present

## 2023-08-10 NOTE — Patient Instructions (Addendum)
 Ms. Sesma , Thank you for taking time out of your busy schedule to complete your Annual Wellness Visit with me. I enjoyed our conversation and look forward to speaking with you again next year. I, as well as your care team,  appreciate your ongoing commitment to your health goals. Please review the following plan we discussed and let me know if I can assist you in the future. Your Game plan/ To Do List    Referrals: If you haven't heard from the office you've been referred to, please reach out to them at the phone provided.   Follow up Visits: Next Medicare AWV with our clinical staff: 08/16/24 @ 8:10a   Have you seen your provider in the last 6 months (3 months if uncontrolled diabetes)?  Next Office Visit with your provider: 08/11/23 @ 9:20a  Clinician Recommendations:  Aim for 30 minutes of exercise or brisk walking, 6-8 glasses of water, and 5 servings of fruits and vegetables each day. Make a appointment for eye exam.      This is a list of the screening recommended for you and due dates:  Health Maintenance  Topic Date Due   DTaP/Tdap/Td vaccine (2 - Td or Tdap) 12/18/2018   COVID-19 Vaccine (5 - 2024-25 season) 09/18/2022   Eye exam for diabetics  12/24/2022   Hemoglobin A1C  07/19/2023   Flu Shot  08/18/2023   Yearly kidney health urinalysis for diabetes  01/19/2024   Complete foot exam   01/19/2024   Colon Cancer Screening  04/16/2024   Yearly kidney function blood test for diabetes  05/17/2024   Medicare Annual Wellness Visit  08/09/2024   Pneumococcal Vaccine for age over 51  Completed   DEXA scan (bone density measurement)  Completed   Hepatitis C Screening  Completed   Zoster (Shingles) Vaccine  Completed   Hepatitis B Vaccine  Aged Out   HPV Vaccine  Aged Out   Meningitis B Vaccine  Aged Out    Advanced directives: (Provided) Advance directive discussed with you today. I have provided a copy for you to complete at home and have notarized. Once this is complete, please  bring a copy in to our office so we can scan it into your chart.  Advance Care Planning is important because it:  [x]  Makes sure you receive the medical care that is consistent with your values, goals, and preferences  [x]  It provides guidance to your family and loved ones and reduces their decisional burden about whether or not they are making the right decisions based on your wishes.  Follow the link provided in your after visit summary or read over the paperwork we have mailed to you to help you started getting your Advance Directives in place. If you need assistance in completing these, please reach out to us  so that we can help you!  See attachments for Preventive Care and Fall Prevention Tips.

## 2023-08-10 NOTE — Progress Notes (Unsigned)
 Subjective:    Patient ID: Carrie Singh, female    DOB: 10/26/1955, 68 y.o.   MRN: [id number]  HPI  Pt presents to the clinic today for 6 month follow up of chronic conditions.  History of right breast cancer: In remission status postlumpectomy, mastectomy, chemo and radiation.  She is taking letrozole  as prescribed.  She follows with oncology.  Depression: Chronic.  She is not currently taking any medications for this.  She is not currently seeing a therapist.  She denies anxiety, SI/HI.  DM2: Her last A1c was 5.6%, 01/2023.  Her sugars range.  She is taking metformin  as prescribed.  She follows with podiatry.  Her last eye exam was 12/2021.  Flu 09/2022.  Pneumovax 12/2019.  Prevnar 11/2018.  COVID Pfizer x 4.  She follows with endocrinology.  GERD: She is not sure what triggers this.  She is not taking any medications for this.  There is no upper GI on file.    HTN: Her BP today is 130/76.  She is taking hydralazine , amlodipine , carvedilol  and irbesartan  as prescribed.  ECG from 09/2021 reviewed.  HLD with aortic atherosclerosis: Her last LDL was 73, triglycerides 74, 01/2023.  She denies myalgias on pravastatin .  She is taking aspirin  as well.  She tries to consume a low-fat diet.  Migraines: These occur rarely.  She is not sure what triggers this. She takes goody powders OTC with some relief of symptoms.  She does not follow with neurology.    Review of Systems  Past Medical History:  Diagnosis Date   Breast cancer (HCC) 01/2021   left breast IDC   Breast cancer, right breast (HCC) 12/2007   a. s/p chemo, radiation, and lumpectomy with negative sentinel lymph node biopsy    Complication of anesthesia    woke up during colonoscopy   Depression    Diabetes mellitus without complication (HCC)    GERD (gastroesophageal reflux disease)    Hyperlipidemia    Hypertension    resistant, negative renal Dopplers and negative CT angiogram   Migraines    Obesity     Current  Outpatient Medications  Medication Sig Dispense Refill   amLODipine  (NORVASC ) 10 MG tablet Take 1 tablet (10 mg total) by mouth daily. 90 tablet 1   aspirin  EC 81 MG tablet Take 1 tablet (81 mg total) by mouth daily. Swallow whole. 30 tablet 12   Biotin w/ Vitamins C & E (HAIR SKIN & NAILS GUMMIES PO) Take by mouth.     calcium carbonate (OSCAL) 1500 (600 Ca) MG TABS tablet Take by mouth 2 (two) times daily with a meal. (Patient not taking: Reported on 08/10/2023)     carvedilol  (COREG ) 12.5 MG tablet Take 1 tablet (12.5 mg total) by mouth in the morning and at bedtime. 90 tablet 0   fluticasone  (FLONASE ) 50 MCG/ACT nasal spray Place 2 sprays into both nostrils daily for 7 days. 1 g 0   gabapentin  (NEURONTIN ) 100 MG capsule Take 100 mg by mouth in the morning and at bedtime.     hydrALAZINE  (APRESOLINE ) 100 MG tablet TAKE 1 TABLET BY MOUTH 3 TIMES  DAILY 300 tablet 0   irbesartan  (AVAPRO ) 300 MG tablet TAKE 1 TABLET BY MOUTH DAILY 100 tablet 2   Lancets (ONETOUCH DELICA PLUS LANCET33G) MISC CHECK BLOOD SUGAR TWICE DAILY 200 each 2   letrozole  (FEMARA ) 2.5 MG tablet Take 1 tablet (2.5 mg total) by mouth daily. 90 tablet 3   lidocaine  (LIDODERM ) 5 %  Place 1 patch onto the skin daily. Remove & Discard patch within 12 hours or as directed by MD 5 patch 0   metFORMIN  (GLUCOPHAGE ) 1000 MG tablet Take 1,000 mg by mouth 2 (two) times daily with a meal.     Multiple Vitamins-Minerals (ONE-A-DAY WOMENS VITACRAVES) CHEW Chew 1 tablet by mouth daily.      ONETOUCH ULTRA test strip USE TO CHECK BLOOD SUGAR TWICE  DAILY AS DIRECTED 200 strip 2   pravastatin  (PRAVACHOL ) 40 MG tablet TAKE 1 TABLET BY MOUTH DAILY 100 tablet 0   repaglinide  (PRANDIN ) 0.5 MG tablet Take 1 tablet (0.5 mg total) by mouth 3 (three) times daily before meals. 270 tablet 0   scopolamine  (TRANSDERM-SCOP) 1 MG/3DAYS Place 1 patch (1.5 mg total) onto the skin every 3 (three) days. For motion sickness. Start 2 hr before onset symptoms, up to  12 hr before 10 patch 0   No current facility-administered medications for this visit.    No Known Allergies  Family History  Problem Relation Age of Onset   Stroke Mother    Colon cancer Father 70   Heart disease Father    Asthma Father    Lung cancer Sister    Colon cancer Brother 25   Diabetes Brother    Breast cancer Niece        dx. <50   Breast cancer Niece        dx. <50   Breast cancer Niece        dx. >50   Esophageal cancer Neg Hx    Rectal cancer Neg Hx    Stomach cancer Neg Hx     Social History   Socioeconomic History   Marital status: Married    Spouse name: Crimson Dubberly   Number of children: 3   Years of education: 12+   Highest education level: Some college, no degree  Occupational History   Occupation: Disabled  Tobacco Use   Smoking status: Never   Smokeless tobacco: Never  Vaping Use   Vaping status: Never Used  Substance and Sexual Activity   Alcohol use: No    Alcohol/week: 0.0 standard drinks of alcohol   Drug use: No   Sexual activity: Yes  Other Topics Concern   Not on file  Social History Narrative   Lives at home with husband.   Caffeine  use: 1-2 drinks per month         Epworth Sleepiness Scale = 15 (as of 01/01/15)   Social Drivers of Health   Financial Resource Strain: Low Risk  (08/10/2023)   Overall Financial Resource Strain (CARDIA)    Difficulty of Paying Living Expenses: Not hard at all  Food Insecurity: No Food Insecurity (08/10/2023)   Hunger Vital Sign    Worried About Running Out of Food in the Last Year: Never true    Ran Out of Food in the Last Year: Never true  Transportation Needs: No Transportation Needs (08/10/2023)   PRAPARE - Administrator, Civil Service (Medical): No    Lack of Transportation (Non-Medical): No  Physical Activity: Insufficiently Active (08/10/2023)   Exercise Vital Sign    Days of Exercise per Week: 2 days    Minutes of Exercise per Session: 30 min  Stress: No Stress Concern  Present (08/10/2023)   [employer] of Occupational Health - Occupational Stress Questionnaire    Feeling of Stress: Not at all  Social Connections: Socially Integrated (08/10/2023)   Social Connection and Isolation Panel  Frequency of Communication with Friends and Family: More than three times a week    Frequency of Social Gatherings with Friends and Family: More than three times a week    Attends Religious Services: More than 4 times per year    Active Member of [employer] or Organizations: Yes    Attends Banker Meetings: More than 4 times per year    Marital Status: Married  Catering manager Violence: Not At Risk (08/10/2023)   Humiliation, Afraid, Rape, and Kick questionnaire    Fear of Current or Ex-Partner: No    Emotionally Abused: No    Physically Abused: No    Sexually Abused: No     Constitutional: Patient reports intermittent headaches.  Denies fever, malaise, fatigue, or abrupt weight changes.  HEENT: Denies eye pain, eye redness, ear pain, ringing in the ears, wax buildup, runny nose, nasal congestion, bloody nose, or sore throat. Respiratory: Denies difficulty breathing, shortness of breath, cough or sputum production.   Cardiovascular: Denies chest pain, chest tightness, palpitations or swelling in the hands or feet.  Gastrointestinal: Denies abdominal pain, bloating, constipation, diarrhea or blood in the stool.  GU: Denies urgency, frequency, pain with urination, burning sensation, blood in urine, odor or discharge. Musculoskeletal: Patient reports intermittent knee pain.  Denies decrease in range of motion, difficulty with gait, muscle pain or joint swelling.  Skin: Denies redness, rashes, lesions or ulcercations.  Neurological: Denies dizziness, difficulty with memory, difficulty with speech or problems with balance and coordination.  Psych: Patient has a history of depression.  Denies anxiety, SI/HI.  No other specific complaints in a complete review  of systems (except as listed in HPI above).     Objective:   Physical Exam  There were no vitals taken for this visit.   Wt Readings from Last 3 Encounters:  08/10/23 205 lb (93 kg)  05/17/23 205 lb 9.6 oz (93.3 kg)  04/13/23 207 lb 3.2 oz (94 kg)    General: Appears her stated age, obese, in NAD. Skin: Warm, dry and intact. No ulcerations noted. HEENT: Head: normal shape and size; Eyes: sclera white, no icterus, conjunctiva pink, PERRLA and EOMs intact;  Cardiovascular: Normal rate and rhythm. S1,S2 noted.  No murmur, rubs or gallops noted. No JVD or BLE edema. No carotid bruits noted. Pulmonary/Chest: Normal effort and positive vesicular breath sounds. No respiratory distress. No wheezes, rales or ronchi noted.  Abdomen: Normal bowel sounds.  Musculoskeletal: No difficulty with gait.  Neurological: Alert and oriented. Coordination normal.  Psychiatric: Mood and affect normal. Behavior is normal. Judgment and thought content normal.     BMET    Component Value Date/Time   NA 139 05/18/2023 1527   NA 146 (H) 01/20/2015 1457   NA 143 10/12/2013 1510   K 4.7 05/18/2023 1527   K 3.9 10/12/2013 1510   CL 102 05/18/2023 1527   CL 109 (H) 10/12/2013 1510   CO2 31 05/18/2023 1527   CO2 29 10/12/2013 1510   GLUCOSE 75 05/18/2023 1527   GLUCOSE 92 10/12/2013 1510   BUN 19 05/18/2023 1527   BUN 17 01/20/2015 1457   BUN 16 10/12/2013 1510   CREATININE 1.26 (H) 05/18/2023 1527   CREATININE 1.21 (H) 01/19/2023 0918   CALCIUM 9.6 05/18/2023 1527   CALCIUM 9.4 10/12/2013 1510   GFRNONAA 46 (L) 05/18/2023 1527   GFRNONAA 56 (L) 10/12/2013 1510   GFRAA >60 01/24/2018 2159   GFRAA >60 10/12/2013 1510    Lipid Panel  Component Value Date/Time   CHOL 151 01/19/2023 0918   CHOL 179 10/17/2014 1527   TRIG 74 01/19/2023 0918   HDL 63 01/19/2023 0918   HDL 59 10/17/2014 1527   CHOLHDL 2.4 01/19/2023 0918   VLDL 15.2 01/09/2020 0957   LDLCALC 73 01/19/2023 0918    CBC     Component Value Date/Time   WBC 4.4 05/18/2023 1527   WBC 4.0 01/19/2023 0918   RBC 4.14 05/18/2023 1527   HGB 12.2 05/18/2023 1527   HGB 13.5 10/12/2013 1510   HGB 12.8 08/13/2009 0946   HCT 36.0 05/18/2023 1527   HCT 40.5 10/12/2013 1510   HCT 36.8 08/13/2009 0946   PLT 257 05/18/2023 1527   PLT 278 10/12/2013 1510   PLT 271 08/13/2009 0946   MCV 87.0 05/18/2023 1527   MCV 87 10/12/2013 1510   MCV 87.0 08/13/2009 0946   MCH 29.5 05/18/2023 1527   MCHC 33.9 05/18/2023 1527   RDW 13.1 05/18/2023 1527   RDW 13.6 10/12/2013 1510   RDW 13.8 08/13/2009 0946   LYMPHSABS 1.9 05/18/2023 1527   LYMPHSABS 1.5 08/13/2009 0946   MONOABS 0.3 05/18/2023 1527   MONOABS 0.2 08/13/2009 0946   EOSABS 0.1 05/18/2023 1527   EOSABS 0.1 08/13/2009 0946   BASOSABS 0.0 05/18/2023 1527   BASOSABS 0.0 08/13/2009 0946    Hgb A1C Lab Results  Component Value Date   HGBA1C 5.6 01/19/2023            Assessment & Plan:     RTC in 6 months for your annual exam Angeline Laura, NP

## 2023-08-10 NOTE — Progress Notes (Signed)
 Subjective:   Carrie Singh is a 68 y.o. who presents for a Medicare Wellness preventive visit.  As a reminder, Annual Wellness Visits don't include a physical exam, and some assessments may be limited, especially if this visit is performed virtually. We may recommend an in-person follow-up visit with your provider if needed.  Visit Complete: Virtual I connected with  Carrie Singh on 08/10/23 by a audio enabled telemedicine application and verified that I am speaking with the correct person using two identifiers.  Patient Location: Home  Provider Location: Home Office  I discussed the limitations of evaluation and management by telemedicine. The patient expressed understanding and agreed to proceed.  Vital Signs: Because this visit was a virtual/telehealth visit, some criteria may be missing or patient reported. Any vitals not documented were not able to be obtained and vitals that have been documented are patient reported.    Persons Participating in Visit: Patient.  AWV Questionnaire: No: Patient Medicare AWV questionnaire was not completed prior to this visit.  Cardiac Risk Factors include: advanced age (>74men, >26 women);diabetes mellitus;hypertension     Objective:    Today's Vitals   08/10/23 0814  Weight: 205 lb (93 kg)  Height: 5' 5 (1.651 m)   Body mass index is 34.11 kg/m.     08/10/2023    8:24 AM 01/17/2023    8:49 PM 01/05/2023    2:36 PM 09/27/2022    9:32 PM 09/13/2022    4:23 AM 07/29/2022    9:14 AM 10/12/2021   10:54 PM  Advanced Directives  Does Patient Have a Medical Advance Directive? No No No No No No No  Would patient like information on creating a medical advance directive? No - Patient declined No - Guardian declined   No - Patient declined No - Patient declined     Current Medications (verified) Outpatient Encounter Medications as of 08/10/2023  Medication Sig   gabapentin  (NEURONTIN ) 100 MG capsule Take 100 mg by mouth in the morning  and at bedtime.   amLODipine  (NORVASC ) 10 MG tablet Take 1 tablet (10 mg total) by mouth daily.   aspirin  EC 81 MG tablet Take 1 tablet (81 mg total) by mouth daily. Swallow whole.   Biotin w/ Vitamins C & E (HAIR SKIN & NAILS GUMMIES PO) Take by mouth.   calcium carbonate (OSCAL) 1500 (600 Ca) MG TABS tablet Take by mouth 2 (two) times daily with a meal. (Patient not taking: Reported on 08/10/2023)   carvedilol  (COREG ) 12.5 MG tablet Take 1 tablet (12.5 mg total) by mouth in the morning and at bedtime.   fluticasone  (FLONASE ) 50 MCG/ACT nasal spray Place 2 sprays into both nostrils daily for 7 days.   hydrALAZINE  (APRESOLINE ) 100 MG tablet TAKE 1 TABLET BY MOUTH 3 TIMES  DAILY   irbesartan  (AVAPRO ) 300 MG tablet TAKE 1 TABLET BY MOUTH DAILY   Lancets (ONETOUCH DELICA PLUS LANCET33G) MISC CHECK BLOOD SUGAR TWICE DAILY   letrozole  (FEMARA ) 2.5 MG tablet Take 1 tablet (2.5 mg total) by mouth daily.   lidocaine  (LIDODERM ) 5 % Place 1 patch onto the skin daily. Remove & Discard patch within 12 hours or as directed by MD   metFORMIN  (GLUCOPHAGE ) 1000 MG tablet Take 1,000 mg by mouth 2 (two) times daily with a meal.   Multiple Vitamins-Minerals (ONE-A-DAY WOMENS VITACRAVES) CHEW Chew 1 tablet by mouth daily.    ONETOUCH ULTRA test strip USE TO CHECK BLOOD SUGAR TWICE  DAILY AS DIRECTED  pravastatin  (PRAVACHOL ) 40 MG tablet TAKE 1 TABLET BY MOUTH DAILY   repaglinide  (PRANDIN ) 0.5 MG tablet Take 1 tablet (0.5 mg total) by mouth 3 (three) times daily before meals.   scopolamine  (TRANSDERM-SCOP) 1 MG/3DAYS Place 1 patch (1.5 mg total) onto the skin every 3 (three) days. For motion sickness. Start 2 hr before onset symptoms, up to 12 hr before   No facility-administered encounter medications on file as of 08/10/2023.    Allergies (verified) Patient has no known allergies.   History: Past Medical History:  Diagnosis Date   Breast cancer (HCC) 01/2021   left breast IDC   Breast cancer, right breast  (HCC) 12/2007   a. s/p chemo, radiation, and lumpectomy with negative sentinel lymph node biopsy    Complication of anesthesia    woke up during colonoscopy   Depression    Diabetes mellitus without complication (HCC)    GERD (gastroesophageal reflux disease)    Hyperlipidemia    Hypertension    resistant, negative renal Dopplers and negative CT angiogram   Migraines    Obesity    Past Surgical History:  Procedure Laterality Date   ABDOMINAL HYSTERECTOMY     BREAST LUMPECTOMY     CYST EXCISION     FOOT SURGERY     MASTECTOMY Right    MASTECTOMY W/ SENTINEL NODE BIOPSY Right 03/18/2016   Procedure: RIGHT MASTECTOMY WITH RIGHT AXILLARY SENTINEL LYMPH NODE BIOPSY;  Surgeon: Alm Angle, MD;  Location: [facility] OR;  Service: General;  Laterality: Right;   MASTECTOMY W/ SENTINEL NODE BIOPSY Left 03/10/2021   Procedure: LEFT MASTECTOMY WITH LEFT SENTINEL LYMPH NODE BIOPSY;  Surgeon: Vanderbilt Ned, MD;  Location: [facility];  Service: General;  Laterality: Left;   ovary tumor     vocal cord nodules     Family History  Problem Relation Age of Onset   Stroke Mother    Colon cancer Father 33   Heart disease Father    Asthma Father    Lung cancer Sister    Colon cancer Brother 68   Diabetes Brother    Breast cancer Niece        dx. <50   Breast cancer Niece        dx. <50   Breast cancer Niece        dx. >50   Esophageal cancer Neg Hx    Rectal cancer Neg Hx    Stomach cancer Neg Hx    Social History   Socioeconomic History   Marital status: Married    Spouse name: Ashana Tullo   Number of children: 3   Years of education: 12+   Highest education level: Some college, no degree  Occupational History   Occupation: Disabled  Tobacco Use   Smoking status: Never   Smokeless tobacco: Never  Vaping Use   Vaping status: Never Used  Substance and Sexual Activity   Alcohol use: No    Alcohol/week: 0.0 standard drinks of alcohol   Drug use: No   Sexual activity:  Yes  Other Topics Concern   Not on file  Social History Narrative   Lives at home with husband.   Caffeine  use: 1-2 drinks per month         Epworth Sleepiness Scale = 15 (as of 01/01/15)   Social Drivers of Health   Financial Resource Strain: Low Risk  (08/10/2023)   Overall Financial Resource Strain (CARDIA)    Difficulty of Paying Living Expenses: Not hard at  all  Food Insecurity: No Food Insecurity (08/10/2023)   Hunger Vital Sign    Worried About Running Out of Food in the Last Year: Never true    Ran Out of Food in the Last Year: Never true  Transportation Needs: No Transportation Needs (08/10/2023)   PRAPARE - Administrator, Civil Service (Medical): No    Lack of Transportation (Non-Medical): No  Physical Activity: Insufficiently Active (08/10/2023)   Exercise Vital Sign    Days of Exercise per Week: 2 days    Minutes of Exercise per Session: 30 min  Stress: No Stress Concern Present (08/10/2023)   [employer] of Occupational Health - Occupational Stress Questionnaire    Feeling of Stress: Not at all  Social Connections: Socially Integrated (08/10/2023)   Social Connection and Isolation Panel    Frequency of Communication with Friends and Family: More than three times a week    Frequency of Social Gatherings with Friends and Family: More than three times a week    Attends Religious Services: More than 4 times per year    Active Member of [employer] or Organizations: Yes    Attends Engineer, structural: More than 4 times per year    Marital Status: Married    Tobacco Counseling Counseling given: Not Answered    Clinical Intake:  Pre-visit preparation completed: Yes  Pain : No/denies pain     BMI - recorded: 34.11 Nutritional Status: BMI > 30  Obese Nutritional Risks: None Diabetes: Yes CBG done?: No Did pt. bring in CBG monitor from home?: No  Lab Results  Component Value Date   HGBA1C 5.6 01/19/2023   HGBA1C 6.0 (H) 05/03/2022    HGBA1C 5.9 (H) 01/18/2022     How often do you need to have someone help you when you read instructions, pamphlets, or other written materials from your doctor or pharmacy?: 1 - Never  Interpreter Needed?: No  Information entered by :: Rojelio Blush LPN   Activities of Daily Living     08/10/2023    8:23 AM 09/29/2022    2:33 PM  In your present state of health, do you have any difficulty performing the following activities:  Hearing? 0 0  Vision? 0 0  Difficulty concentrating or making decisions? 0 0  Walking or climbing stairs? 1 1  Comment Uses [employer] or bathing? 0 0  Doing errands, shopping? 0 0  Preparing Food and eating ? N   Using the Toilet? N   In the past six months, have you accidently leaked urine? N   Do you have problems with loss of bowel control? N   Managing your Medications? N   Managing your Finances? N   Housekeeping or managing your Housekeeping? N     Patient Care Team: Antonette Angeline ORN, NP as PCP - General (Internal Medicine) Perla Evalene PARAS, MD as Consulting Physician (Cardiology) Odean Potts, MD as Consulting Physician (Hematology and Oncology) Crawford Morna Pickle, NP as Nurse Practitioner (Hematology and Oncology) Vanderbilt Ned, MD as Consulting Physician (General Surgery) Douglas Rule, MD as Consulting Physician (Nephrology)  I have updated your Care Teams any recent Medical Services you may have received from other providers in the past year.     Assessment:   This is a routine wellness examination for Carrie Singh.  Hearing/Vision screen Hearing Screening - Comments:: Denies hearing difficulties   Vision Screening - Comments:: Wears rx glasses - up to date with routine eye exams  with  [facility]   Goals Addressed               This Visit's Progress     Increase physical activity (pt-stated)        Remain active as I can.       Depression Screen     08/10/2023    8:22 AM 01/19/2023    9:05  AM 09/29/2022    2:33 PM 07/29/2022    9:09 AM 06/21/2022   10:17 AM 05/03/2022    9:35 AM 01/18/2022    9:16 AM  PHQ 2/9 Scores  PHQ - 2 Score 0 1 0 0 0 1 0  PHQ- 9 Score       1    Fall Risk     08/10/2023    8:24 AM 01/19/2023    9:04 AM 09/29/2022    2:33 PM 07/29/2022    9:12 AM 07/25/2022    9:59 AM  Fall Risk   Falls in the past year? 0 0 0 0 0  Number falls in past yr: 0   0 0  Injury with Fall? 0  0 0 0  Risk for fall due to : No Fall Risks  No Fall Risks No Fall Risks   Follow up Falls evaluation completed   Falls prevention discussed     MEDICARE RISK AT HOME:  Medicare Risk at Home Any stairs in or around the home?: Yes If so, are there any without handrails?: No Home free of loose throw rugs in walkways, pet beds, electrical cords, etc?: Yes Adequate lighting in your home to reduce risk of falls?: Yes Life alert?: No Use of a cane, walker or w/c?: Yes Grab bars in the bathroom?: No Shower chair or bench in shower?: No Elevated toilet seat or a handicapped toilet?: No  TIMED UP AND GO:  Was the test performed?  No  Cognitive Function: 6CIT completed        08/10/2023    8:24 AM 07/29/2022    9:14 AM 07/08/2021   12:45 PM  6CIT Screen  What Year? 0 points 0 points 0 points  What month? 0 points 0 points 0 points  What time? 0 points 0 points 0 points  Count back from 20 0 points 0 points 0 points  Months in reverse 0 points 0 points 0 points  Repeat phrase 0 points 0 points 0 points  Total Score 0 points 0 points 0 points    Immunizations Immunization History  Administered Date(s) Administered   Fluad Quad(high Dose 65+) 09/20/2018, 10/30/2019, 11/16/2020, 09/09/2021   Fluad Trivalent(High Dose 65+) 09/29/2022   Influenza-Unspecified 03/06/2017   PFIZER(Purple Top)SARS-COV-2 Vaccination 03/22/2019, 04/12/2019, 10/30/2019   Pfizer Covid-19 Vaccine Bivalent Booster 83yrs & up 01/19/2021   Pneumococcal Conjugate-13 12/06/2018   Pneumococcal  Polysaccharide-23 01/09/2020   Tdap 12/17/2008   Zoster Recombinant(Shingrix ) 06/10/2021, 09/09/2021    Screening Tests Health Maintenance  Topic Date Due   DTaP/Tdap/Td (2 - Td or Tdap) 12/18/2018   COVID-19 Vaccine (5 - 2024-25 season) 09/18/2022   OPHTHALMOLOGY EXAM  12/24/2022   HEMOGLOBIN A1C  07/19/2023   INFLUENZA VACCINE  08/18/2023   Diabetic kidney evaluation - Urine ACR  01/19/2024   FOOT EXAM  01/19/2024   Colonoscopy  04/16/2024   Diabetic kidney evaluation - eGFR measurement  05/17/2024   Medicare Annual Wellness (AWV)  08/09/2024   Pneumococcal Vaccine: 50+ Years  Completed   DEXA SCAN  Completed   Hepatitis  C Screening  Completed   Zoster Vaccines- Shingrix   Completed   Hepatitis B Vaccines  Aged Out   HPV VACCINES  Aged Out   Meningococcal B Vaccine  Aged Out    Health Maintenance  Health Maintenance Due  Topic Date Due   DTaP/Tdap/Td (2 - Td or Tdap) 12/18/2018   COVID-19 Vaccine (5 - 2024-25 season) 09/18/2022   OPHTHALMOLOGY EXAM  12/24/2022   HEMOGLOBIN A1C  07/19/2023   Health Maintenance Items Addressed: Pending Opththalmogy appt  Additional Screening:  Vision Screening: Recommended annual ophthalmology exams for early detection of glaucoma and other disorders of the eye. Would you like a referral to an eye doctor? No    Dental Screening: Recommended annual dental exams for proper oral hygiene  Community Resource Referral / Chronic Care Management: CRR required this visit?  No   CCM required this visit?  No   Plan:    I have personally reviewed and noted the following in the patient's chart:   Medical and social history Use of alcohol, tobacco or illicit drugs  Current medications and supplements including opioid prescriptions. Patient is not currently taking opioid prescriptions. Functional ability and status Nutritional status Physical activity Advanced directives List of other physicians Hospitalizations, surgeries, and ER  visits in previous 12 months Vitals Screenings to include cognitive, depression, and falls Referrals and appointments  In addition, I have reviewed and discussed with patient certain preventive protocols, quality metrics, and best practice recommendations. A written personalized care plan for preventive services as well as general preventive health recommendations were provided to patient.   Rojelio LELON Blush, LPN   [id number]/[id number]   After Visit Summary: (MyChart) Due to this being a telephonic visit, the after visit summary with patients personalized plan was offered to patient via MyChart   Notes: Nothing significant to report at this time.

## 2023-08-11 ENCOUNTER — Encounter: Payer: Self-pay | Admitting: Internal Medicine

## 2023-08-11 ENCOUNTER — Ambulatory Visit: Payer: Self-pay | Admitting: Internal Medicine

## 2023-08-11 VITALS — BP 124/70 | HR 76 | Ht 65.0 in | Wt 206.0 lb

## 2023-08-11 DIAGNOSIS — E1165 Type 2 diabetes mellitus with hyperglycemia: Secondary | ICD-10-CM | POA: Diagnosis not present

## 2023-08-11 DIAGNOSIS — M5430 Sciatica, unspecified side: Secondary | ICD-10-CM | POA: Insufficient documentation

## 2023-08-11 DIAGNOSIS — Z9109 Other allergy status, other than to drugs and biological substances: Secondary | ICD-10-CM

## 2023-08-11 DIAGNOSIS — Z7984 Long term (current) use of oral hypoglycemic drugs: Secondary | ICD-10-CM

## 2023-08-11 DIAGNOSIS — K219 Gastro-esophageal reflux disease without esophagitis: Secondary | ICD-10-CM

## 2023-08-11 DIAGNOSIS — E66811 Obesity, class 1: Secondary | ICD-10-CM

## 2023-08-11 DIAGNOSIS — I1A0 Resistant hypertension: Secondary | ICD-10-CM | POA: Diagnosis not present

## 2023-08-11 DIAGNOSIS — I700 Atherosclerosis of aorta: Secondary | ICD-10-CM | POA: Diagnosis not present

## 2023-08-11 DIAGNOSIS — M179 Osteoarthritis of knee, unspecified: Secondary | ICD-10-CM | POA: Insufficient documentation

## 2023-08-11 DIAGNOSIS — E1169 Type 2 diabetes mellitus with other specified complication: Secondary | ICD-10-CM

## 2023-08-11 DIAGNOSIS — M170 Bilateral primary osteoarthritis of knee: Secondary | ICD-10-CM

## 2023-08-11 DIAGNOSIS — G44011 Episodic cluster headache, intractable: Secondary | ICD-10-CM

## 2023-08-11 DIAGNOSIS — F450 Somatization disorder: Secondary | ICD-10-CM

## 2023-08-11 DIAGNOSIS — M5431 Sciatica, right side: Secondary | ICD-10-CM

## 2023-08-11 DIAGNOSIS — Z853 Personal history of malignant neoplasm of breast: Secondary | ICD-10-CM

## 2023-08-11 DIAGNOSIS — E785 Hyperlipidemia, unspecified: Secondary | ICD-10-CM

## 2023-08-11 DIAGNOSIS — F32A Depression, unspecified: Secondary | ICD-10-CM

## 2023-08-11 NOTE — Assessment & Plan Note (Signed)
 Improved Okay to take tylenol  OTC if needed

## 2023-08-11 NOTE — Assessment & Plan Note (Signed)
Not currently an issue off medications

## 2023-08-11 NOTE — Assessment & Plan Note (Signed)
 Continue gabapentin  100 mg 2 times daily and Lidoderm  patches Encouraged regular stretching

## 2023-08-11 NOTE — Assessment & Plan Note (Signed)
Stable off meds Support offered 

## 2023-08-11 NOTE — Assessment & Plan Note (Signed)
 A1c today Urine microalbumin has been checked within the last year Encourage low carb diet and exercise for weight loss Continue metformin  1000 mg twice daily and Prandin  0.5 mg 3 times daily Encouraged routine eye exam Encouraged routine foot exam Immunizations UTD

## 2023-08-11 NOTE — Assessment & Plan Note (Signed)
 Encourage diet and exercise for weight loss

## 2023-08-11 NOTE — Assessment & Plan Note (Signed)
 C-Met and lipid profile today Continue pravastatin  40 mg and aspirin  81 mg daily Encouraged her to consume low-fat diet

## 2023-08-11 NOTE — Assessment & Plan Note (Signed)
 Controlled on hydralazine  100 mg 3 times daily, amlodipine  10 mg daily, carvedilol  12.5 mg twice daily and irbesartan  300 mg daily Reinforced DASH diet C-Met today

## 2023-08-11 NOTE — Patient Instructions (Signed)
 Atherosclerosis  Atherosclerosis is when plaque builds up in the arteries. This causes narrowing and hardening of the arteries. Arteries are blood vessels that carry blood from the heart to all parts of the body. This blood contains oxygen. Plaque occurs due to inflammation or from a buildup of fat, cholesterol, calcium, waste products of cells, and a clotting material in the blood (fibrin). Plaque decreases the amount of blood that can flow through the artery. Atherosclerosis can affect any artery in your body, including: Heart arteries. Damage to these arteries may lead to coronary artery disease, which can cause a heart attack. Brain arteries. Damage to these arteries may cause a stroke. Leg, arm, and pelvis arteries. Peripheral artery disease (PAD) may result from damage to these arteries. Kidney arteries. Kidney (renal) failure may result from damage to kidney arteries. Treatment may slow the disease and prevent further damage to your heart, brain, peripheral arteries, and kidneys. What are the causes? This condition develops slowly over many years. The inner layers of your arteries become damaged and allow the gradual buildup of plaque. The exact cause of atherosclerosis is not fully understood. Symptoms of atherosclerosis do not occur until an artery becomes narrow or blocked. What increases the risk? The following factors may make you more likely to develop this condition: Being middle-aged or older. Certain medical conditions, including: High blood pressure. High cholesterol. High blood fats (triglycerides). Diabetes. Sleep apnea. Obesity. Certain lab levels, including: Elevated C-reactive protein (CRP). This is a sign of increased inflammation in your body. Elevated homocysteine levels. This is an amino acid that is associated with heart and blood vessel disease. Using tobacco or nicotine products. A family history of atherosclerosis. Not exercising enough (sedentary  lifestyle). Being stressed. Drinking too much alcohol or using drugs, such as cocaine or methamphetamine. What are the signs or symptoms? Symptoms of atherosclerosis do not occur until the plaque severely narrows or blocks the artery, which decreases blood flow. Sometimes, atherosclerosis does not cause symptoms. Symptoms of this condition include: Coronary artery disease. This may cause chest pain and shortness of breath. Decreased blood supply to your brain, which may cause a stroke. Signs of a stroke may include sudden: Weakness or numbness in your face, arm, or leg, especially on one side of your body. Trouble walking or difficulty moving your arms or legs. Loss of balance or coordination. Confusion. Slurred speech. Trouble speaking, or trouble understanding speech, or both (aphasia). Vision changes in one or both eyes. This may be double vision, blurred vision, or loss of vision. Severe headache with no known cause. The headache is often described as the worst headache ever experienced. PAD, which may cause pain, numbness, or nonhealing wounds, often in your legs and hips. Renal failure. This may cause tiredness, problems with urination, swelling, and itchy skin. How is this diagnosed? This condition is diagnosed based on your medical history and a physical exam. During the exam, your health care provider will: Check your pulse in different places. Listen for a "whooshing" sound over your arteries (bruit). You may also have tests, such as: Blood tests to check your levels of cholesterol, triglycerides, blood sugar, and CRP. Ankle-brachial index to compare blood pressure in your arms to blood pressure in your ankles to see how your blood is flowing. Heart (cardiac) tests. Electrocardiogram (ECG) to check for heart damage. Stress test to see how your heart reacts to exercise. Ultrasound tests. Ultrasound of your peripheral arteries to check blood flow. Echocardiogram to get images of  your heart's  chambers and valves. X-ray tests. Chest X-ray to see if you have an enlarged heart, which is a sign of heart failure. CT scan to check for damage to your heart, brain, or arteries. Angiogram. This is a test where dye is injected and X-rays are used to see the blood flow in the arteries. How is this treated? This condition is treated with lifestyle changes as the first step. These may include: Changing your diet. Losing weight. Reducing stress. Exercising and being physically active more regularly. Quitting smoking. You may also need medicine to: Lower triglycerides and cholesterol. Control blood pressure. Prevent blood clots. Lower inflammation in your body. Control your blood sugar. Sometimes, surgery is needed to: Remove plaque from an artery (endarterectomy). Open or widen a narrowed heart artery or peripheral artery (angioplasty). Create a new path for your blood with one of these procedures: Heart (coronary) artery bypass graft surgery. Peripheral artery bypass graft surgery. Place a small mesh tube (stent) in an artery to open or widen a narrowed artery. Follow these instructions at home: Eating and drinking  Eat a heart-healthy diet. Talk with your health care provider or a dietitian if you need help. A heart-healthy diet involves: Limiting unhealthy fats and increasing healthy fats. Some examples of healthy fats are avocados and olive oil. Eating plant-based foods, such as fruits, vegetables, nuts, whole grains, and legumes (such as peas and lentils). If you drink alcohol: Limit how much you have to: 0-1 drink a day for women who are not pregnant. 0-2 drinks a day for men. Know how much alcohol is in a drink. In the [place] one drink equals one 12 oz bottle of beer (355 mL), one 5 oz glass of wine (148 mL), or one 1 oz glass of hard liquor (44 mL). Lifestyle  Maintain a healthy weight. Lose weight if your health care provider says that you need to do  that. Follow an exercise program as told by your health care provider. Do not use any products that contain nicotine or tobacco. These products include cigarettes, chewing tobacco, and vaping devices, such as e-cigarettes. If you need help quitting, ask your health care provider. Do not use drugs. General instructions Take over-the-counter and prescription medicines only as told by your health care provider. Manage other health conditions as told. Keep all follow-up visits. This is important. Contact a health care provider if you have: An irregular heartbeat. Unexplained tiredness (fatigue). Trouble urinating, or you are producing less urine or foamy urine. Swelling of your hands or feet, or itchy skin. Unexplained pain or numbness in your legs or hips. A wound that is slow to heal or is not healing. Get help right away if: You have any symptoms of a heart attack. These may be: Chest pain. This includes squeezing chest pain that may feel like indigestion (angina). Shortness of breath. Pain in your neck, jaw, arms, back, or stomach. Cold sweat. Nausea. Light-headedness. Sudden pain, numbness, or coldness in a limb. You have any symptoms of a stroke. "BE FAST" is an easy way to remember the main warning signs of a stroke: B - Balance. Signs are dizziness, sudden trouble walking, or loss of balance. E - Eyes. Signs are trouble seeing or a sudden change in vision. F - Face. Signs are sudden weakness or numbness of the face, or the face or eyelid drooping on one side. A - Arms. Signs are weakness or numbness in an arm. This happens suddenly and usually on one side of the body. S -  Speech. Signs are sudden trouble speaking, slurred speech, or trouble understanding what people say. T - Time. Time to call emergency services. Write down what time symptoms started. You have other signs of a stroke, such as: A sudden, severe headache with no known cause. Nausea or vomiting. Seizure. These  symptoms may represent a serious problem that is an emergency. Do not wait to see if the symptoms will go away. Get medical help right away. Call your local emergency services (911 in the [place] Do not drive yourself to the hospital. Summary Atherosclerosis is when plaque builds up in the arteries and causes narrowing and hardening of the arteries. Plaque occurs due to inflammation or from a buildup of fat, cholesterol, calcium, cellular waste products, and fibrin. This condition may not cause any symptoms. Symptoms of atherosclerosis do not occur until the plaque severely narrows or blocks the artery. Treatment starts with lifestyle changes and may include medicines. In some cases, surgery is needed. Get help right away if you have any symptoms of a heart attack or stroke. This information is not intended to replace advice given to you by your health care provider. Make sure you discuss any questions you have with your health care provider. Document Revised: 04/08/2020 Document Reviewed: 04/08/2020 Elsevier Patient Education  2024 [employer].

## 2023-08-11 NOTE — Assessment & Plan Note (Signed)
 Encourage weight loss as this can help reduce joint pain Continue gabapentin  100 mg twice daily and Lidoderm  patches

## 2023-08-11 NOTE — Assessment & Plan Note (Signed)
 C-Met and lipid profile today Encouraged her to consume low-fat diet Continue pravastatin  40 mg daily

## 2023-08-11 NOTE — Assessment & Plan Note (Signed)
 Continue letrozole  2.5 mg daily per oncology

## 2023-08-12 LAB — COMPREHENSIVE METABOLIC PANEL WITH GFR
AG Ratio: 1.6 (calc) (ref 1.0–2.5)
ALT: 11 U/L (ref 6–29)
AST: 11 U/L (ref 10–35)
Albumin: 4.3 g/dL (ref 3.6–5.1)
Alkaline phosphatase (APISO): 63 U/L (ref 37–153)
BUN: 16 mg/dL (ref 7–25)
CO2: 29 mmol/L (ref 20–32)
Calcium: 9.7 mg/dL (ref 8.6–10.4)
Chloride: 105 mmol/L (ref 98–110)
Creat: 0.97 mg/dL (ref 0.60–1.00)
Globulin: 2.7 g/dL (ref 1.9–3.7)
Glucose, Bld: 80 mg/dL (ref 65–99)
Potassium: 4.3 mmol/L (ref 3.5–5.3)
Sodium: 142 mmol/L (ref 135–146)
Total Bilirubin: 1 mg/dL (ref 0.2–1.2)
Total Protein: 7 g/dL (ref 6.1–8.1)
eGFR: 63 mL/min/1.73m2 (ref >=60)

## 2023-08-12 LAB — LIPID PANEL
Cholesterol: 183 mg/dL (ref <200)
HDL: 80 mg/dL (ref >=50)
LDL Cholesterol (Calc): 84 mg/dL
Non-HDL Cholesterol (Calc): 103 mg/dL (ref <130)
Total CHOL/HDL Ratio: 2.3 (calc) (ref <5.0)
Triglycerides: 92 mg/dL (ref <150)

## 2023-08-12 LAB — CBC
HCT: 38.7 % (ref 35.0–45.0)
Hemoglobin: 12.2 g/dL (ref 11.7–15.5)
MCH: 29.3 pg (ref 27.0–33.0)
MCHC: 31.5 g/dL — ABNORMAL LOW (ref 32.0–36.0)
MCV: 92.8 fL (ref 80.0–100.0)
MPV: 9.3 fL (ref 7.5–12.5)
Platelets: 249 Thousand/uL (ref 140–400)
RBC: 4.17 Million/uL (ref 3.80–5.10)
RDW: 12.9 % (ref 11.0–15.0)
WBC: 3.9 Thousand/uL (ref 3.8–10.8)

## 2023-08-12 LAB — HEMOGLOBIN A1C
Hgb A1c MFr Bld: 5.9 % — ABNORMAL HIGH (ref <5.7)
Mean Plasma Glucose: 123 mg/dL
eAG (mmol/L): 6.8 mmol/L

## 2023-08-14 ENCOUNTER — Ambulatory Visit: Payer: Self-pay | Admitting: Internal Medicine

## 2023-08-22 ENCOUNTER — Other Ambulatory Visit: Payer: Self-pay

## 2023-08-22 MED ORDER — ACCU-CHEK GUIDE W/DEVICE KIT: 1.0000 | PACK | Freq: Three times a day (TID) | 0 refills | Status: DC

## 2023-09-07 ENCOUNTER — Other Ambulatory Visit: Payer: Self-pay

## 2023-09-07 MED ORDER — ACCU-CHEK SOFTCLIX LANCETS MISC: 12 refills | Status: AC

## 2023-09-07 MED ORDER — ACCU-CHEK GUIDE W/DEVICE KIT
1.0000 | PACK | Freq: Three times a day (TID) | 0 refills | Status: AC
Start: 2023-09-07 — End: ?

## 2023-09-08 ENCOUNTER — Other Ambulatory Visit: Payer: Self-pay | Admitting: Internal Medicine

## 2023-09-11 NOTE — Telephone Encounter (Signed)
 Requested Prescriptions  Pending Prescriptions Disp Refills   hydrALAZINE  (APRESOLINE ) 100 MG tablet [Pharmacy Med Name: hydrALAZINE  HCl 100 MG Oral Tablet] 300 tablet 1    Sig: TAKE 1 TABLET BY MOUTH 3 TIMES  DAILY     Cardiovascular:  Vasodilators Failed - 09/11/2023  1:23 PM      Failed - ANA Screen, Ifa, Serum in normal range and within 360 days    Anti Nuclear Antibody(ANA)  Date Value Ref Range Status  10/17/2014 Negative Negative Final         Passed - HCT in normal range and within 360 days    HCT  Date Value Ref Range Status  08/11/2023 38.7 35.0 - 45.0 % Final  10/12/2013 40.5 35.0 - 47.0 % Final  08/13/2009 36.8 34.8 - 46.6 % Final         Passed - HGB in normal range and within 360 days    Hemoglobin  Date Value Ref Range Status  08/11/2023 12.2 11.7 - 15.5 g/dL Final  94/98/7974 87.7 12.0 - 15.0 g/dL Final   HGB  Date Value Ref Range Status  10/12/2013 13.5 12.0 - 16.0 g/dL Final  92/71/7988 87.1 11.6 - 15.9 g/dL Final         Passed - RBC in normal range and within 360 days    RBC  Date Value Ref Range Status  08/11/2023 4.17 3.80 - 5.10 Million/uL Final         Passed - WBC in normal range and within 360 days    WBC  Date Value Ref Range Status  08/11/2023 3.9 3.8 - 10.8 Thousand/uL Final         Passed - PLT in normal range and within 360 days    Platelets  Date Value Ref Range Status  08/11/2023 249 140 - 400 Thousand/uL Final  08/13/2009 271 145 - 400 10e3/uL Final   Platelet  Date Value Ref Range Status  10/12/2013 278 150 - 440 x10 3/mm 3 Final   Platelet Count  Date Value Ref Range Status  05/18/2023 257 150 - 400 K/uL Final         Passed - Last BP in normal range    BP Readings from Last 1 Encounters:  08/11/23 124/70         Passed - Valid encounter within last 12 months    Recent Outpatient Visits           1 month ago Type 2 diabetes mellitus with hyperglycemia, without long-term current use of insulin  [facility])   [place]  [facility] [place], Angeline ORN, NP   5 months ago Carpal tunnel syndrome on left   [place] [facility] [place], Angeline ORN, NP               pravastatin  (PRAVACHOL ) 40 MG tablet [Pharmacy Med Name: Pravastatin  Sodium 40 MG Oral Tablet] 100 tablet 1    Sig: TAKE 1 TABLET BY MOUTH DAILY     Cardiovascular:  Antilipid - Statins Failed - 09/11/2023  1:23 PM      Failed - Lipid Panel in normal range within the last 12 months    Cholesterol, Total  Date Value Ref Range Status  10/17/2014 179 100 - 199 mg/dL Final   Cholesterol  Date Value Ref Range Status  08/11/2023 183 <200 mg/dL Final   LDL Cholesterol (Calc)  Date Value Ref Range Status  08/11/2023 84 mg/dL (calc) Final    Comment:  Reference range: <100 . Desirable range <100 mg/dL for primary prevention;   <70 mg/dL for patients with CHD or diabetic patients  with > or = 2 CHD risk factors. SABRA LDL-C is now calculated using the Martin-Hopkins  calculation, which is a validated novel method providing  better accuracy than the Friedewald equation in the  estimation of LDL-C.  Gladis APPLETHWAITE et al. SANDREA. 7986;689(80): 2061-2068  (http://education.[employer].com/faq/FAQ164)    Direct LDL  Date Value Ref Range Status  09/05/2013 169.6 mg/dL Final    Comment:    Optimal:  <100 mg/dLNear or Above Optimal:  100-129 mg/dLBorderline High:  130-159 mg/dLHigh:  160-189 mg/dLVery High:  >190 mg/dL   HDL  Date Value Ref Range Status  08/11/2023 80 > OR = 50 mg/dL Final  90/69/7983 59 >60 mg/dL Final    Comment:    According to ATP-III Guidelines, HDL-C >59 mg/dL is considered a negative risk factor for CHD.    Triglycerides  Date Value Ref Range Status  08/11/2023 92 <150 mg/dL Final         Passed - Patient is not pregnant      Passed - Valid encounter within last 12 months    Recent Outpatient Visits           1 month ago Type 2 diabetes mellitus with hyperglycemia, without  long-term current use of insulin  [facility])   [place] [facility] [place], Angeline ORN, NP   5 months ago Carpal tunnel syndrome on left   [facility] [place], Angeline ORN, [place]

## 2023-09-17 ENCOUNTER — Encounter (HOSPITAL_BASED_OUTPATIENT_CLINIC_OR_DEPARTMENT_OTHER): Payer: Self-pay

## 2023-09-17 ENCOUNTER — Emergency Department (HOSPITAL_BASED_OUTPATIENT_CLINIC_OR_DEPARTMENT_OTHER)
Admission: EM | Admit: 2023-09-17 | Discharge: 2023-09-17 | Disposition: A | Discharge status: Home / Self Care | Attending: Emergency Medicine | Admitting: Emergency Medicine

## 2023-09-17 ENCOUNTER — Other Ambulatory Visit: Payer: Self-pay

## 2023-09-17 ENCOUNTER — Emergency Department (HOSPITAL_BASED_OUTPATIENT_CLINIC_OR_DEPARTMENT_OTHER)

## 2023-09-17 DIAGNOSIS — Z7982 Long term (current) use of aspirin: Secondary | ICD-10-CM | POA: Diagnosis not present

## 2023-09-17 DIAGNOSIS — R062 Wheezing: Secondary | ICD-10-CM | POA: Diagnosis not present

## 2023-09-17 DIAGNOSIS — Z853 Personal history of malignant neoplasm of breast: Secondary | ICD-10-CM | POA: Diagnosis not present

## 2023-09-17 DIAGNOSIS — Z7984 Long term (current) use of oral hypoglycemic drugs: Secondary | ICD-10-CM | POA: Insufficient documentation

## 2023-09-17 DIAGNOSIS — I10 Essential (primary) hypertension: Secondary | ICD-10-CM | POA: Insufficient documentation

## 2023-09-17 DIAGNOSIS — R051 Acute cough: Secondary | ICD-10-CM

## 2023-09-17 DIAGNOSIS — E119 Type 2 diabetes mellitus without complications: Secondary | ICD-10-CM | POA: Diagnosis not present

## 2023-09-17 DIAGNOSIS — R0981 Nasal congestion: Secondary | ICD-10-CM | POA: Diagnosis present

## 2023-09-17 DIAGNOSIS — Z79899 Other long term (current) drug therapy: Secondary | ICD-10-CM | POA: Diagnosis not present

## 2023-09-17 DIAGNOSIS — R059 Cough, unspecified: Secondary | ICD-10-CM | POA: Insufficient documentation

## 2023-09-17 LAB — RESP PANEL BY RT-PCR (RSV, FLU A&B, COVID)  RVPGX2
Influenza A by PCR: NEGATIVE
Influenza B by PCR: NEGATIVE
Resp Syncytial Virus by PCR: NEGATIVE
SARS Coronavirus 2 by RT PCR: NEGATIVE

## 2023-09-17 MED ORDER — ALBUTEROL SULFATE HFA 108 (90 BASE) MCG/ACT IN AERS
1.0000 | INHALATION_SPRAY | Freq: Once | RESPIRATORY_TRACT | Status: AC
  Administered 2023-09-17: 2 via RESPIRATORY_TRACT
  Filled 2023-09-17: qty 6.7

## 2023-09-17 MED ORDER — AEROCHAMBER PLUS FLO-VU MEDIUM MISC
1.0000 | Freq: Once | Status: AC
  Administered 2023-09-17: 1

## 2023-09-17 MED ORDER — PREDNISONE 20 MG PO TABS
40.0000 mg | ORAL_TABLET | Freq: Every day | ORAL | 0 refills | Status: AC
Start: 2023-09-18 — End: 2023-09-22

## 2023-09-17 MED ORDER — PREDNISONE 20 MG PO TABS: 40.0000 mg | ORAL_TABLET | Freq: Every day | ORAL | 0 refills | Status: DC

## 2023-09-17 MED ORDER — AMOXICILLIN-POT CLAVULANATE 875-125 MG PO TABS: 1.0000 | ORAL_TABLET | Freq: Two times a day (BID) | ORAL | 0 refills | Status: DC

## 2023-09-17 MED ORDER — PREDNISONE 20 MG PO TABS
40.0000 mg | ORAL_TABLET | Freq: Once | ORAL | Status: AC
  Administered 2023-09-17: 40 mg via ORAL
  Filled 2023-09-17: qty 2

## 2023-09-17 NOTE — Discharge Instructions (Signed)
 Your x-ray did not show obvious pneumonia or other acute cardiopulmonary abnormality.  Your viral testing was negative for COVID, flu, RSV.  Suspect you have bronchitis in the setting of a viral illness.  You may continue to take Allegra, nasal spray.  We will also place you on antibiotics for concern for possible bacterial cause of sinusitis which would also cover most bacterial cause of pneumonia.  Continue she albuterol  inhaler at home as needed for wheezing.  Will also send over the short course of corticosteroids to help treat your symptoms.  Recommend close follow-up with your primary care for reassessment.  Please do not hesitate to return to the emergency department if the worrisome signs and symptoms discussed become apparent.

## 2023-09-17 NOTE — ED Notes (Signed)
 Patient transported to X-ray

## 2023-09-17 NOTE — ED Provider Notes (Signed)
 [place] [facility] Provider Note   CSN: [id number] Arrival date & time: 09/17/23  1617     Patient presents with: URI   Carrie Singh is a 68 y.o. female.    URI   68 year old female presents to the emergency department with complaints of nasal congestion, cough.  Symptoms present for the past week.  States that she began experience symptoms when she was on her cruise in the [place].  Denies any fevers, chills, chest pain, shortness of breath, abdominal pain.  Has tried Allegra, Nasacort /Flonase  with some improvement of symptoms.  Presents due to continued symptoms.  States that she is generally feeling a little better wanted to be checked out due to how long she is experiencing symptoms for.  Past medical history significant for breast cancer, diabetes mellitus, GERD, hypertension, hyperlipidemia, aortic atherosclerosis  Prior to Admission medications   Medication Sig Start Date End Date Taking? Authorizing Provider  Accu-Chek Softclix Lancets lancets Use 2-3 times daily prn 09/07/23   Antonette Angeline ORN, NP  amLODipine  (NORVASC ) 10 MG tablet Take 1 tablet (10 mg total) by mouth daily. 05/15/23   Antonette Angeline ORN, NP  aspirin  EC 81 MG tablet Take 1 tablet (81 mg total) by mouth daily. Swallow whole. 01/18/22   Antonette Angeline ORN, NP  Biotin w/ Vitamins C & E (HAIR SKIN & NAILS GUMMIES PO) Take by mouth.    [provider]  Blood Glucose Monitoring Suppl (ACCU-CHEK GUIDE) w/Device KIT 1 kit by Does not apply route in the morning, at noon, and at bedtime. 09/07/23   Antonette Angeline ORN, NP  carvedilol  (COREG ) 12.5 MG tablet Take 1 tablet (12.5 mg total) by mouth in the morning and at bedtime. 05/15/23   Antonette Angeline ORN, NP  fluticasone  (FLONASE ) 50 MCG/ACT nasal spray Place 2 sprays into both nostrils daily for 7 days. 09/27/22 08/11/23  Darra Fonda MATSU, MD  gabapentin  (NEURONTIN ) 100 MG capsule Take 100 mg by mouth in the morning and at bedtime.     [provider]  hydrALAZINE  (APRESOLINE ) 100 MG tablet TAKE 1 TABLET BY MOUTH 3 TIMES  DAILY 09/11/23   Antonette Angeline ORN, NP  irbesartan  (AVAPRO ) 300 MG tablet TAKE 1 TABLET BY MOUTH DAILY 05/15/23   Antonette Angeline ORN, NP  letrozole  (FEMARA ) 2.5 MG tablet Take 1 tablet (2.5 mg total) by mouth daily. 12/29/22   Odean Potts, MD  lidocaine  (LIDODERM ) 5 % Place 1 patch onto the skin daily. Remove & Discard patch within 12 hours or as directed by MD 09/13/22   Theadore Ozell HERO, MD  metFORMIN  (GLUCOPHAGE ) 1000 MG tablet Take 1,000 mg by mouth 2 (two) times daily with a meal.    [provider]  Multiple Vitamins-Minerals (ONE-A-DAY WOMENS VITACRAVES) CHEW Chew 1 tablet by mouth daily.     [provider]  [facility] ULTRA test strip USE TO CHECK BLOOD SUGAR TWICE  DAILY AS DIRECTED 01/20/23   Antonette Angeline ORN, NP  pravastatin  (PRAVACHOL ) 40 MG tablet TAKE 1 TABLET BY MOUTH DAILY 09/11/23   Antonette Angeline ORN, NP  repaglinide  (PRANDIN ) 0.5 MG tablet Take 1 tablet (0.5 mg total) by mouth 3 (three) times daily before meals. 07/20/23   Antonette Angeline ORN, NP  scopolamine  (TRANSDERM-SCOP) 1 MG/3DAYS Place 1 patch (1.5 mg total) onto the skin every 3 (three) days. For motion sickness. Start 2 hr before onset symptoms, up to 12 hr before 04/13/23   Antonette Angeline ORN, NP  Allergies: Patient has no known allergies.    Review of Systems  All other systems reviewed and are negative.   Updated Vital Signs BP (!) 151/89   Pulse 75   Temp 98.7 F (37.1 C) (Oral)   Resp 18   Wt 93.4 kg   SpO2 100%   BMI 34.28 kg/m   Physical Exam Vitals and nursing note reviewed.  Constitutional:      General: She is not in acute distress.    Appearance: She is well-developed.  HENT:     Head: Normocephalic and atraumatic.     Comments: Maxillary sinus tenderness to palpation.    Right Ear: Tympanic membrane normal.     Left Ear: Tympanic membrane normal.     Nose: Congestion and rhinorrhea present.      Mouth/Throat:     Pharynx: No oropharyngeal exudate or posterior oropharyngeal erythema.  Eyes:     Conjunctiva/sclera: Conjunctivae normal.  Cardiovascular:     Rate and Rhythm: Normal rate and regular rhythm.     Heart sounds: No murmur heard. Pulmonary:     Effort: Pulmonary effort is normal. No respiratory distress.     Breath sounds: Wheezing present.  Abdominal:     Palpations: Abdomen is soft.     Tenderness: There is no abdominal tenderness.  Musculoskeletal:        General: No swelling.     Cervical back: Neck supple.     Right lower leg: No edema.     Left lower leg: No edema.  Skin:    General: Skin is warm and dry.     Capillary Refill: Capillary refill takes less than 2 seconds.  Neurological:     Mental Status: She is alert.  Psychiatric:        Mood and Affect: Mood normal.     (all labs ordered are listed, but only abnormal results are displayed) Labs Reviewed  RESP PANEL BY RT-PCR (RSV, FLU A&B, COVID)  [id number]    EKG: None  Radiology: DG Chest 2 View Result Date: 09/17/2023 CLINICAL DATA:  Cough, congestion, and headaches for 1 week. EXAM: CHEST - 2 VIEW COMPARISON:  10/06/2021. FINDINGS: The heart size and mediastinal contours are within normal limits. There is atherosclerotic calcification of the aorta. No consolidation, effusion, or pneumothorax is seen. No acute osseous abnormality. IMPRESSION: No active cardiopulmonary disease. Electronically Signed   By: Leita Birmingham M.D.   On: 09/17/2023 16:44     Procedures   Medications Ordered in the ED  albuterol  (VENTOLIN  HFA) 108 (90 Base) MCG/ACT inhaler 1-2 puff (2 puffs Inhalation Given 09/17/23 1705)  AeroChamber Plus Flo-Vu Medium MISC 1 each (1 each Other Given 09/17/23 1705)  predniSONE  (DELTASONE ) tablet 40 mg (40 mg Oral Given 09/17/23 1723)                                    Medical Decision Making Amount and/or Complexity of Data Reviewed Radiology: ordered.  Risk Prescription drug  management.   This patient presents to the ED for concern of nasal congestion, cough, this involves an extensive number of treatment options, and is a complaint that carries with it a high risk of complications and morbidity.  The differential diagnosis includes COVID, flu, RSV, asthma, COPD, pneumonia, CHF, other.   Co morbidities that complicate the patient evaluation  See HPI   Additional history obtained:  Additional history obtained from EMR  External records from outside source obtained and reviewed including hospital records   Lab Tests:  I Ordered, and personally interpreted labs.  The pertinent results include: Viral testing negative   Imaging Studies ordered:  I ordered imaging studies including chest x-ray I independently visualized and interpreted imaging which showed no acute pulmonary abnormality I agree with the radiologist interpretation   Cardiac Monitoring: / EKG:  The patient was maintained on a cardiac monitor.  I personally viewed and interpreted the cardiac monitored which showed an underlying rhythm of: Sinus rhythm   Consultations Obtained:  N/a   Problem List / ED Course / Critical interventions / Medication management  Nasal congestion, cough, wheeze I ordered medication including albuterol , prednisone    Reevaluation of the patient after these medicines showed that the patient improved I have reviewed the patients home medicines and have made adjustments as needed   Social Determinants of Health:  Denies tobacco, licit drug use.   Test / Admission - Considered:  Nasal congestion, cough, wheeze Vitals signs significant for hypertension blood pressure 141/89. Otherwise within normal range and stable throughout visit. Laboratory/imaging studies significant for: See above  68 year old female presents to the emergency department with complaints of nasal congestion, cough.  Symptoms present for the past week.  States that she began  experience symptoms when she was on her cruise in the [place].  Denies any fevers, chills, chest pain, shortness of breath, abdominal pain.  Has tried Allegra, Nasacort /Flonase  with some improvement of symptoms.  Presents due to continued symptoms.  States that she is generally feeling a little better wanted to be checked out due to how long she is experiencing symptoms for. On exam, faint auscultatory wheeze appreciated bilateral lung fields.  Nasal congestion/rhinorrhea present.  Abdomen nontender.  Viral testing negative.  Chest x-ray without obvious pneumonia.  Patient reports prior history of asthma but this was 16 to 20 years ago and has not used breathing treatment since then.  Suspect bronchitis in the setting of viral URI.  Will treat accordingly with albuterol  and short course of corticosteroids.  Patient is with maxillary sinus tenderness to palpation has been present for the past 7 days or so.  Will treat empirically with antibiotics given concern for possible bacterial etiology.  Recommend additional symptomatic therapy as an AVS follow-up with primary care for reassessment.  Treatment plan discussed with patient treatment understanding was agreeable.  Patient well-appearing, afebrile in no acute distress. Worrisome signs and symptoms were discussed with the patient, and the patient acknowledged understanding to return to the ED if noticed. Patient was stable upon discharge.       Final diagnoses:  None    ED Discharge Orders     None          Silver Wonda LABOR, [place] 09/17/23 1754    Emil Share, DO 09/17/23 2209

## 2023-09-17 NOTE — ED Notes (Signed)
 ED Provider at bedside.

## 2023-09-17 NOTE — ED Triage Notes (Signed)
 Reports congestion, cough, headaches for 1 week. States recently on cruise.

## 2023-09-18 ENCOUNTER — Other Ambulatory Visit: Payer: Self-pay | Admitting: Internal Medicine

## 2023-09-18 DIAGNOSIS — I10 Essential (primary) hypertension: Secondary | ICD-10-CM

## 2023-09-19 NOTE — Telephone Encounter (Signed)
 Amlodipine - 05/15/23 #90 1RF- too soon Repaglinide - 07/20/23 #270-too soon Requested Prescriptions  Pending Prescriptions Disp Refills   ONETOUCH ULTRA test strip [Pharmacy Med Name: OneTouch Ultra In Vitro Strip] 200 strip 2    Sig: USE TO CHECK BLOOD SUGAR TWICE  DAILY AS DIRECTED     Endocrinology: Diabetes - Testing Supplies Passed - 09/19/2023  3:35 PM      Passed - Valid encounter within last 12 months    Recent Outpatient Visits           1 month ago Type 2 diabetes mellitus with hyperglycemia, without long-term current use of insulin  [facility])   [place] [facility] [place], [place], NP   5 months ago Carpal tunnel syndrome on left   [place] [facility] [place], Angeline ORN, NP              Refused Prescriptions Disp Refills   repaglinide  (PRANDIN ) 0.5 MG tablet [Pharmacy Med Name: REPAGLINIDE   0.5MG   TAB] 270 tablet 3    Sig: TAKE 1 TABLET BY MOUTH 3 TIMES  DAILY BEFORE MEALS     Endocrinology:  Diabetes - Meglitinides Passed - 09/19/2023  3:35 PM      Passed - HBA1C is between 0 and 7.9 and within 180 days    Hgb A1c MFr Bld  Date Value Ref Range Status  08/11/2023 5.9 (H) <5.7 % Final    Comment:    For someone without known diabetes, a hemoglobin  A1c value between 5.7% and 6.4% is consistent with prediabetes and should be confirmed with a  follow-up test. . For someone with known diabetes, a value <7% indicates that their diabetes is well controlled. A1c targets should be individualized based on duration of diabetes, age, comorbid conditions, and other considerations. . This assay result is consistent with an increased risk of diabetes. . Currently, no consensus exists regarding use of hemoglobin A1c for diagnosis of diabetes for children. .          Passed - Cr in normal range and within 360 days    Creat  Date Value Ref Range Status  08/11/2023 0.97 0.60 - 1.00 mg/dL Final   Creatinine, Urine  Date Value Ref Range  Status  01/19/2023 132 20 - 275 mg/dL Final         Passed - Valid encounter within last 6 months    Recent Outpatient Visits           1 month ago Type 2 diabetes mellitus with hyperglycemia, without long-term current use of insulin  [facility])   [place] [facility] [place], [place], NP   5 months ago Carpal tunnel syndrome on left   [facility] [place], [place], NP               amLODipine  (NORVASC ) 10 MG tablet [Pharmacy Med Name: amLODIPine  Besylate 10 MG Oral Tablet] 80 tablet 3    Sig: TAKE 1 TABLET BY MOUTH DAILY     Cardiovascular: Calcium Channel Blockers 2 Failed - 09/19/2023  3:35 PM      Failed - Last BP in normal range    BP Readings from Last 1 Encounters:  09/17/23 (!) 151/89         Passed - Last Heart Rate in normal range    Pulse Readings from Last 1 Encounters:  09/17/23 75         Passed - Valid encounter within last  6 months    Recent Outpatient Visits           1 month ago Type 2 diabetes mellitus with hyperglycemia, without long-term current use of insulin  [facility])   [place] [facility] [place], Angeline ORN, NP   5 months ago Carpal tunnel syndrome on left   [facility] [place] [place], Angeline ORN, [place]

## 2023-09-20 ENCOUNTER — Other Ambulatory Visit: Payer: Self-pay | Admitting: Internal Medicine

## 2023-09-20 ENCOUNTER — Encounter: Payer: Self-pay | Admitting: Internal Medicine

## 2023-09-20 DIAGNOSIS — I10 Essential (primary) hypertension: Secondary | ICD-10-CM

## 2023-09-20 MED ORDER — ACCU-CHEK GUIDE TEST VI STRP: 1.0000 | ORAL_STRIP | Freq: Two times a day (BID) | 0 refills | Status: DC

## 2023-09-21 NOTE — Telephone Encounter (Signed)
 Requested Prescriptions  Refused Prescriptions Disp Refills   amLODipine  (NORVASC ) 10 MG tablet [Pharmacy Med Name: amLODIPine  Besylate 10 MG Oral Tablet] 80 tablet 3    Sig: TAKE 1 TABLET BY MOUTH DAILY     Cardiovascular: Calcium Channel Blockers 2 Failed - 09/21/2023 10:39 AM      Failed - Last BP in normal range    BP Readings from Last 1 Encounters:  09/17/23 (!) 151/89         Passed - Last Heart Rate in normal range    Pulse Readings from Last 1 Encounters:  09/17/23 75         Passed - Valid encounter within last 6 months    Recent Outpatient Visits           1 month ago Type 2 diabetes mellitus with hyperglycemia, without long-term current use of insulin  [facility])   [facility] [place], Angeline ORN, NP   5 months ago Carpal tunnel syndrome on left   [facility] [place], Angeline ORN, [place]

## 2023-10-11 ENCOUNTER — Other Ambulatory Visit: Payer: Self-pay | Admitting: Internal Medicine

## 2023-10-11 DIAGNOSIS — I10 Essential (primary) hypertension: Secondary | ICD-10-CM

## 2023-10-13 NOTE — Telephone Encounter (Signed)
 Requested Prescriptions  Pending Prescriptions Disp Refills   amLODipine  (NORVASC ) 10 MG tablet [Pharmacy Med Name: amLODIPine  Besylate 10 MG Oral Tablet] 90 tablet 0    Sig: TAKE 1 TABLET BY MOUTH DAILY     Cardiovascular: Calcium Channel Blockers 2 Failed - 10/13/2023 10:53 AM      Failed - Last BP in normal range    BP Readings from Last 1 Encounters:  09/17/23 (!) 151/89         Passed - Last Heart Rate in normal range    Pulse Readings from Last 1 Encounters:  09/17/23 75         Passed - Valid encounter within last 6 months    Recent Outpatient Visits           2 months ago Type 2 diabetes mellitus with hyperglycemia, without long-term current use of insulin  [facility])   [place] [facility] [place], [place], NP   6 months ago Carpal tunnel syndrome on left   [place] [facility] [place], Angeline ORN, NP               repaglinide  (PRANDIN ) 0.5 MG tablet [Pharmacy Med Name: REPAGLINIDE   0.5MG   TAB] 270 tablet 0    Sig: TAKE 1 TABLET BY MOUTH 3 TIMES  DAILY BEFORE MEALS     Endocrinology:  Diabetes - Meglitinides Passed - 10/13/2023 10:53 AM      Passed - HBA1C is between 0 and 7.9 and within 180 days    Hgb A1c MFr Bld  Date Value Ref Range Status  08/11/2023 5.9 (H) <5.7 % Final    Comment:    For someone without known diabetes, a hemoglobin  A1c value between 5.7% and 6.4% is consistent with prediabetes and should be confirmed with a  follow-up test. . For someone with known diabetes, a value <7% indicates that their diabetes is well controlled. A1c targets should be individualized based on duration of diabetes, age, comorbid conditions, and other considerations. . This assay result is consistent with an increased risk of diabetes. . Currently, no consensus exists regarding use of hemoglobin A1c for diagnosis of diabetes for children. .          Passed - Cr in normal range and within 360 days    Creat  Date Value Ref  Range Status  08/11/2023 0.97 0.60 - 1.00 mg/dL Final   Creatinine, Urine  Date Value Ref Range Status  01/19/2023 132 20 - 275 mg/dL Final         Passed - Valid encounter within last 6 months    Recent Outpatient Visits           2 months ago Type 2 diabetes mellitus with hyperglycemia, without long-term current use of insulin  [facility])    [place] [facility] [place], Angeline ORN, NP   6 months ago Carpal tunnel syndrome on left   [facility] [place], Angeline ORN, [place]

## 2023-11-14 ENCOUNTER — Other Ambulatory Visit: Payer: Self-pay | Admitting: Hematology and Oncology

## 2023-11-14 ENCOUNTER — Other Ambulatory Visit: Payer: Self-pay | Admitting: Internal Medicine

## 2023-11-16 NOTE — Telephone Encounter (Signed)
 Requested medication (s) are due for refill today: Yes  Requested medication (s) are on the active medication list: Yes  Last refill:  09/20/23  Future visit scheduled: Yes  Notes to clinic:  Unable to refill due to no refill protocol for this medication.      Requested Prescriptions  Pending Prescriptions Disp Refills   ACCU-CHEK GUIDE TEST test strip [Pharmacy Med Name: Accu-Chek Guide In Vitro Strip] 200 strip 2    Sig: USE AS DIRECTED TWICE DAILY     There is no refill protocol information for this order

## 2023-11-21 ENCOUNTER — Encounter (HOSPITAL_BASED_OUTPATIENT_CLINIC_OR_DEPARTMENT_OTHER): Payer: Self-pay | Admitting: Emergency Medicine

## 2023-11-21 ENCOUNTER — Other Ambulatory Visit: Payer: Self-pay

## 2023-11-21 ENCOUNTER — Emergency Department (HOSPITAL_BASED_OUTPATIENT_CLINIC_OR_DEPARTMENT_OTHER)
Admission: EM | Admit: 2023-11-21 | Discharge: 2023-11-22 | Disposition: A | Discharge status: Home / Self Care | Attending: Emergency Medicine | Admitting: Emergency Medicine

## 2023-11-21 DIAGNOSIS — Z7982 Long term (current) use of aspirin: Secondary | ICD-10-CM | POA: Insufficient documentation

## 2023-11-21 DIAGNOSIS — Z7984 Long term (current) use of oral hypoglycemic drugs: Secondary | ICD-10-CM | POA: Insufficient documentation

## 2023-11-21 DIAGNOSIS — Z79899 Other long term (current) drug therapy: Secondary | ICD-10-CM | POA: Insufficient documentation

## 2023-11-21 DIAGNOSIS — B349 Viral infection, unspecified: Secondary | ICD-10-CM | POA: Insufficient documentation

## 2023-11-21 DIAGNOSIS — Z853 Personal history of malignant neoplasm of breast: Secondary | ICD-10-CM | POA: Insufficient documentation

## 2023-11-21 DIAGNOSIS — I10 Essential (primary) hypertension: Secondary | ICD-10-CM | POA: Insufficient documentation

## 2023-11-21 DIAGNOSIS — R531 Weakness: Secondary | ICD-10-CM

## 2023-11-21 DIAGNOSIS — E119 Type 2 diabetes mellitus without complications: Secondary | ICD-10-CM | POA: Insufficient documentation

## 2023-11-21 LAB — CBC
HCT: 35.5 % — ABNORMAL LOW (ref 36.0–46.0)
Hemoglobin: 11.8 g/dL — ABNORMAL LOW (ref 12.0–15.0)
MCH: 29.5 pg (ref 26.0–34.0)
MCHC: 33.2 g/dL (ref 30.0–36.0)
MCV: 88.8 fL (ref 80.0–100.0)
Platelets: 227 K/uL (ref 150–400)
RBC: 4 MIL/uL (ref 3.87–5.11)
RDW: 13.1 % (ref 11.5–15.5)
WBC: 12.1 K/uL — ABNORMAL HIGH (ref 4.0–10.5)
nRBC: 0 % (ref 0.0–0.2)

## 2023-11-21 NOTE — ED Triage Notes (Signed)
 Pt in with congestion, cough and weakness x 2 days. Pt reports generalized body aches and 3 falls due to weakness since yesterday. Pt denies hitting head, no thinners concerning falls - does report L hip pain since latest fall tonight.

## 2023-11-22 ENCOUNTER — Emergency Department (HOSPITAL_BASED_OUTPATIENT_CLINIC_OR_DEPARTMENT_OTHER)

## 2023-11-22 ENCOUNTER — Encounter: Payer: Self-pay | Admitting: Internal Medicine

## 2023-11-22 LAB — COMPREHENSIVE METABOLIC PANEL WITH GFR
ALT: 8 U/L (ref 0–44)
AST: 13 U/L — ABNORMAL LOW (ref 15–41)
Albumin: 3.9 g/dL (ref 3.5–5.0)
Alkaline Phosphatase: 70 U/L (ref 38–126)
Anion gap: 13 (ref 5–15)
BUN: 14 mg/dL (ref 8–23)
CO2: 21 mmol/L — ABNORMAL LOW (ref 22–32)
Calcium: 9 mg/dL (ref 8.9–10.3)
Chloride: 102 mmol/L (ref 98–111)
Creatinine, Ser: 1.09 mg/dL — ABNORMAL HIGH (ref 0.44–1.00)
GFR, Estimated: 54 mL/min — ABNORMAL LOW (ref >60)
Glucose, Bld: 189 mg/dL — ABNORMAL HIGH (ref 70–99)
Potassium: 3.8 mmol/L (ref 3.5–5.1)
Sodium: 137 mmol/L (ref 135–145)
Total Bilirubin: 0.9 mg/dL (ref 0.0–1.2)
Total Protein: 6.7 g/dL (ref 6.5–8.1)

## 2023-11-22 LAB — URINALYSIS, ROUTINE W REFLEX MICROSCOPIC
Bilirubin Urine: NEGATIVE
Glucose, UA: 250 mg/dL — AB
Hgb urine dipstick: NEGATIVE
Ketones, ur: NEGATIVE mg/dL
Leukocytes,Ua: NEGATIVE
Nitrite: NEGATIVE
Protein, ur: NEGATIVE mg/dL
Specific Gravity, Urine: 1.02 (ref 1.005–1.030)
pH: 6.5 (ref 5.0–8.0)

## 2023-11-22 LAB — RESP PANEL BY RT-PCR (RSV, FLU A&B, COVID)  RVPGX2
Influenza A by PCR: NEGATIVE
Influenza B by PCR: NEGATIVE
Resp Syncytial Virus by PCR: NEGATIVE
SARS Coronavirus 2 by RT PCR: NEGATIVE

## 2023-11-22 MED ORDER — LACTATED RINGERS IV BOLUS
1000.0000 mL | Freq: Once | INTRAVENOUS | Status: AC
  Administered 2023-11-22: 1000 mL via INTRAVENOUS

## 2023-11-22 NOTE — ED Notes (Signed)
Ambulated pt in hallway. 

## 2023-11-22 NOTE — ED Provider Notes (Signed)
 [place] [facility]  Provider Note  CSN: [id number] Arrival date & time: 11/21/23 2318  History Chief Complaint  Patient presents with   Weakness   Flu-Like Symptoms    Carrie Singh is a 68 y.o. female with history of DM, HTN, prior breast cancer, reports she has been feeling poorly for 2 days with congestion, productive cough, chills, body aches and generalized weakness. She reports she has fallen a few times due to the weakness, but no reported injuries other than some L hip soreness. She denies head injury or LOC.    Home Medications Prior to Admission medications   Medication Sig Start Date End Date Taking? Authorizing Provider  ACCU-CHEK GUIDE TEST test strip USE AS DIRECTED TWICE DAILY 11/16/23   Antonette Angeline ORN, NP  Accu-Chek Softclix Lancets lancets Use 2-3 times daily prn 09/07/23   Antonette Angeline ORN, NP  amLODipine  (NORVASC ) 10 MG tablet TAKE 1 TABLET BY MOUTH DAILY 10/13/23   Antonette Angeline ORN, NP  amoxicillin -clavulanate (AUGMENTIN ) 875-125 MG tablet Take 1 tablet by mouth every 12 (twelve) hours. 09/17/23   Silver Wonda LABOR, PA  aspirin  EC 81 MG tablet Take 1 tablet (81 mg total) by mouth daily. Swallow whole. 01/18/22   Antonette Angeline ORN, NP  Biotin w/ Vitamins C & E (HAIR SKIN & NAILS GUMMIES PO) Take by mouth.    [provider]  Blood Glucose Monitoring Suppl (ACCU-CHEK GUIDE) w/Device KIT 1 kit by Does not apply route in the morning, at noon, and at bedtime. 09/07/23   Antonette Angeline ORN, NP  carvedilol  (COREG ) 12.5 MG tablet Take 1 tablet (12.5 mg total) by mouth in the morning and at bedtime. 05/15/23   Antonette Angeline ORN, NP  fluticasone  (FLONASE ) 50 MCG/ACT nasal spray Place 2 sprays into both nostrils daily for 7 days. 09/27/22 08/11/23  Darra Fonda MATSU, MD  gabapentin  (NEURONTIN ) 100 MG capsule Take 100 mg by mouth in the morning and at bedtime.    [provider]  hydrALAZINE  (APRESOLINE ) 100 MG tablet TAKE 1 TABLET BY MOUTH  3 TIMES  DAILY 09/11/23   Antonette Angeline ORN, NP  irbesartan  (AVAPRO ) 300 MG tablet TAKE 1 TABLET BY MOUTH DAILY 05/15/23   Antonette Angeline ORN, NP  letrozole  (FEMARA ) 2.5 MG tablet TAKE 1 TABLET BY MOUTH DAILY 11/15/23   Gudena, Vinay, MD  lidocaine  (LIDODERM ) 5 % Place 1 patch onto the skin daily. Remove & Discard patch within 12 hours or as directed by MD 09/13/22   Theadore Ozell HERO, MD  metFORMIN  (GLUCOPHAGE ) 1000 MG tablet Take 1,000 mg by mouth 2 (two) times daily with a meal.    [provider]  Multiple Vitamins-Minerals (ONE-A-DAY WOMENS VITACRAVES) CHEW Chew 1 tablet by mouth daily.     [provider]  pravastatin  (PRAVACHOL ) 40 MG tablet TAKE 1 TABLET BY MOUTH DAILY 09/11/23   Antonette Angeline ORN, NP  repaglinide  (PRANDIN ) 0.5 MG tablet TAKE 1 TABLET BY MOUTH 3 TIMES  DAILY BEFORE MEALS 10/13/23   Antonette Angeline ORN, NP  scopolamine  (TRANSDERM-SCOP) 1 MG/3DAYS Place 1 patch (1.5 mg total) onto the skin every 3 (three) days. For motion sickness. Start 2 hr before onset symptoms, up to 12 hr before 04/13/23   Antonette Angeline ORN, NP     Allergies    Patient has no known allergies.   Review of Systems   Review of Systems Please see HPI for pertinent positives and negatives  Physical Exam  BP 119/77   Pulse 93   Temp 99.6 F (37.6 C) (Oral)   Resp 18   Wt 95.3 kg   SpO2 99%   BMI 34.95 kg/m   Physical Exam Vitals and nursing note reviewed.  Constitutional:      Appearance: Normal appearance.  HENT:     Head: Normocephalic and atraumatic.     Nose: Nose normal.     Mouth/Throat:     Mouth: Mucous membranes are moist.  Eyes:     Extraocular Movements: Extraocular movements intact.     Conjunctiva/sclera: Conjunctivae normal.  Cardiovascular:     Rate and Rhythm: Normal rate.  Pulmonary:     Effort: Pulmonary effort is normal.     Breath sounds: Normal breath sounds.  Abdominal:     General: Abdomen is flat.     Palpations: Abdomen is soft.     Tenderness: There is no  abdominal tenderness.  Musculoskeletal:        General: No swelling, tenderness or deformity. Normal range of motion.     Cervical back: Neck supple.  Skin:    General: Skin is warm and dry.  Neurological:     General: No focal deficit present.     Mental Status: She is alert.  Psychiatric:        Mood and Affect: Mood normal.     ED Results / Procedures / Treatments   EKG None  Procedures Procedures  Medications Ordered in the ED Medications  lactated ringers  bolus 1,000 mL (0 mLs Intravenous Stopped 11/22/23 0227)    Initial Impression and Plan  Patient here with likely viral URI, some generalized weakness over the last few days as well. Labs done in triage show unremarkable CBC, CMP, UA and Covid/Flu/RSV swab. Will add CXR and give a liter of IVF for comfort.   ED Course   Clinical Course as of 11/22/23 0228  Wed Nov 22, 2023  0143 I personally viewed the images from radiology studies and agree with radiologist interpretation: CXR is clear [CS]  0226 Patient feeling some better after IVF, able to ambulate without assistance and ready to go home. Recommend continued supportive care for viral illness, oral hydration, rest and PCP follow up, RTED for any other concerns.   [CS]    Clinical Course User Index [CS] Roselyn Carlin NOVAK, MD     MDM Rules/Calculators/A&P Medical Decision Making Given presenting complaint, I considered that admission might be necessary. After review of results from ED lab and/or imaging studies, admission to the hospital is not indicated at this time.    Problems Addressed: Acute viral syndrome: acute illness or injury Generalized weakness: acute illness or injury  Amount and/or Complexity of Data Reviewed Labs: ordered. Decision-making details documented in ED Course. Radiology: ordered and independent interpretation performed. Decision-making details documented in ED Course.  Risk OTC drugs. Decision regarding  hospitalization.     Final Clinical Impression(s) / ED Diagnoses Final diagnoses:  Acute viral syndrome  Generalized weakness    Rx / DC Orders ED Discharge Orders     None        Roselyn Carlin NOVAK, MD 11/22/23 [phone]

## 2023-11-22 NOTE — ED Notes (Signed)
 Patient transported to X-ray

## 2023-11-23 MED ORDER — METFORMIN HCL 1000 MG PO TABS: 1000.0000 mg | ORAL_TABLET | Freq: Two times a day (BID) | ORAL | 0 refills | Status: DC

## 2023-11-26 ENCOUNTER — Encounter (HOSPITAL_BASED_OUTPATIENT_CLINIC_OR_DEPARTMENT_OTHER): Payer: Self-pay

## 2023-11-26 ENCOUNTER — Other Ambulatory Visit: Payer: Self-pay

## 2023-11-26 ENCOUNTER — Emergency Department (HOSPITAL_BASED_OUTPATIENT_CLINIC_OR_DEPARTMENT_OTHER): Admission: EM | Admit: 2023-11-26 | Discharge: 2023-11-26 | Disposition: A | Discharge status: Home / Self Care

## 2023-11-26 DIAGNOSIS — I10 Essential (primary) hypertension: Secondary | ICD-10-CM | POA: Diagnosis not present

## 2023-11-26 DIAGNOSIS — Z7984 Long term (current) use of oral hypoglycemic drugs: Secondary | ICD-10-CM | POA: Diagnosis not present

## 2023-11-26 DIAGNOSIS — Z853 Personal history of malignant neoplasm of breast: Secondary | ICD-10-CM | POA: Insufficient documentation

## 2023-11-26 DIAGNOSIS — Z7982 Long term (current) use of aspirin: Secondary | ICD-10-CM | POA: Insufficient documentation

## 2023-11-26 DIAGNOSIS — R059 Cough, unspecified: Secondary | ICD-10-CM | POA: Diagnosis present

## 2023-11-26 DIAGNOSIS — J989 Respiratory disorder, unspecified: Secondary | ICD-10-CM | POA: Diagnosis not present

## 2023-11-26 DIAGNOSIS — Z79899 Other long term (current) drug therapy: Secondary | ICD-10-CM | POA: Insufficient documentation

## 2023-11-26 DIAGNOSIS — E119 Type 2 diabetes mellitus without complications: Secondary | ICD-10-CM | POA: Insufficient documentation

## 2023-11-26 DIAGNOSIS — R051 Acute cough: Secondary | ICD-10-CM

## 2023-11-26 MED ORDER — BENZONATATE 100 MG PO CAPS: 100.0000 mg | ORAL_CAPSULE | Freq: Three times a day (TID) | ORAL | 0 refills | Status: DC

## 2023-11-26 MED ORDER — DOXYCYCLINE HYCLATE 100 MG PO CAPS: 100.0000 mg | ORAL_CAPSULE | Freq: Two times a day (BID) | ORAL | 0 refills | Status: DC

## 2023-11-26 MED ORDER — AZITHROMYCIN 250 MG PO TABS: ORAL_TABLET | ORAL | 0 refills | Status: DC

## 2023-11-26 NOTE — Discharge Instructions (Signed)
 You have symptoms likely due to a viral infection which may take some time to resolved.  However please take antibiotic as prescribed to decrease the risk of superimposed bacterial infection due to your prolonged sickness.  Take antibiotic with food, take Tessalon  as needed for cough.  You may take over-the-counter Tylenol  as needed for aches and pain.  Follow-up closely with your primary care doctor for further care.

## 2023-11-26 NOTE — ED Provider Notes (Signed)
 [name] [facility] [place] Provider Note   CSN: [id number] Arrival date & time: 11/26/23  9096     Patient presents with: No chief complaint on file.   Carrie Singh is a 68 y.o. female.   The history is provided by the patient, the spouse and medical records. No language interpreter was used.     68 year old female with history of diabetes, migraine, obesity, hypertension, remote history of breast cancer, presenting with complaints of flulike symptoms.  For the past week patient has had sinus congestion, generalized fatigue, headache, productive cough, decrease in appetite, and overall not feeling well.  States she was seen in the ER several days ago for her complaint.  Symptoms still persist and is not get any better.  Patient does not endorse any fever but does endorse chills.  She denies any shortness of breath, nausea vomit diarrhea or dysuria.  Denies neck stiffness.  Patient has tried some over-the-counter medication at home without relief.    Prior to Admission medications   Medication Sig Start Date End Date Taking? Authorizing Provider  ACCU-CHEK GUIDE TEST test strip USE AS DIRECTED TWICE DAILY 11/16/23   Antonette Angeline ORN, NP  Accu-Chek Softclix Lancets lancets Use 2-3 times daily prn 09/07/23   Antonette Angeline ORN, NP  amLODipine  (NORVASC ) 10 MG tablet TAKE 1 TABLET BY MOUTH DAILY 10/13/23   Antonette Angeline ORN, NP  amoxicillin -clavulanate (AUGMENTIN ) 875-125 MG tablet Take 1 tablet by mouth every 12 (twelve) hours. 09/17/23   Silver Wonda LABOR, PA  aspirin  EC 81 MG tablet Take 1 tablet (81 mg total) by mouth daily. Swallow whole. 01/18/22   Antonette Angeline ORN, NP  Biotin w/ Vitamins C & E (HAIR SKIN & NAILS GUMMIES PO) Take by mouth.    [provider]  Blood Glucose Monitoring Suppl (ACCU-CHEK GUIDE) w/Device KIT 1 kit by Does not apply route in the morning, at noon, and at bedtime. 09/07/23   Antonette Angeline ORN, NP  carvedilol  (COREG ) 12.5 MG tablet  Take 1 tablet (12.5 mg total) by mouth in the morning and at bedtime. 05/15/23   Antonette Angeline ORN, NP  fluticasone  (FLONASE ) 50 MCG/ACT nasal spray Place 2 sprays into both nostrils daily for 7 days. 09/27/22 08/11/23  LongFonda MATSU, MD  gabapentin  (NEURONTIN ) 100 MG capsule Take 100 mg by mouth in the morning and at bedtime.    [provider]  hydrALAZINE  (APRESOLINE ) 100 MG tablet TAKE 1 TABLET BY MOUTH 3 TIMES  DAILY 09/11/23   Antonette Angeline ORN, NP  irbesartan  (AVAPRO ) 300 MG tablet TAKE 1 TABLET BY MOUTH DAILY 05/15/23   Antonette Angeline ORN, NP  letrozole  (FEMARA ) 2.5 MG tablet TAKE 1 TABLET BY MOUTH DAILY 11/15/23   Gudena, Vinay, MD  lidocaine  (LIDODERM ) 5 % Place 1 patch onto the skin daily. Remove & Discard patch within 12 hours or as directed by MD 09/13/22   Theadore Ozell HERO, MD  metFORMIN  (GLUCOPHAGE ) 1000 MG tablet Take 1 tablet (1,000 mg total) by mouth 2 (two) times daily with a meal. 11/23/23   Baity, Angeline ORN, NP  Multiple Vitamins-Minerals (ONE-A-DAY WOMENS VITACRAVES) CHEW Chew 1 tablet by mouth daily.     [provider]  pravastatin  (PRAVACHOL ) 40 MG tablet TAKE 1 TABLET BY MOUTH DAILY 09/11/23   Antonette Angeline ORN, NP  repaglinide  (PRANDIN ) 0.5 MG tablet TAKE 1 TABLET BY MOUTH 3 TIMES  DAILY BEFORE MEALS 10/13/23   Antonette Angeline ORN, NP  scopolamine  (  TRANSDERM-SCOP) 1 MG/3DAYS Place 1 patch (1.5 mg total) onto the skin every 3 (three) days. For motion sickness. Start 2 hr before onset symptoms, up to 12 hr before 04/13/23   Antonette Angeline ORN, NP    Allergies: Patient has no known allergies.    Review of Systems  All other systems reviewed and are negative.   Updated Vital Signs There were no vitals taken for this visit.  Physical Exam Vitals and nursing note reviewed.  Constitutional:      General: She is not in acute distress.    Appearance: She is well-developed.     Comments: Elderly female laying in bed in no acute discomfort.  Nontoxic in appearance  HENT:      Head: Atraumatic.     Nose: Nose normal.     Mouth/Throat:     Mouth: Mucous membranes are moist.  Eyes:     Conjunctiva/sclera: Conjunctivae normal.  Cardiovascular:     Rate and Rhythm: Normal rate and regular rhythm.     Pulses: Normal pulses.     Heart sounds: Normal heart sounds.  Pulmonary:     Effort: Pulmonary effort is normal.     Breath sounds: No wheezing, rhonchi or rales.  Abdominal:     Palpations: Abdomen is soft.     Tenderness: There is no abdominal tenderness.  Musculoskeletal:     Cervical back: Normal range of motion and neck supple. No rigidity or tenderness.  Lymphadenopathy:     Cervical: No cervical adenopathy.  Skin:    Findings: No rash.  Neurological:     Mental Status: She is alert. Mental status is at baseline.  Psychiatric:        Mood and Affect: Mood normal.     (all labs ordered are listed, but only abnormal results are displayed) Labs Reviewed - No data to display  EKG: None  Radiology: No results found.   Procedures   Medications Ordered in the ED - No data to display                                  Medical Decision Making  BP (!) 157/93 (BP Location: Right Arm)   Pulse 74   Temp 98.3 F (36.8 C) (Oral)   Resp 17   Ht 5' 5 (1.651 m)   Wt 95.3 kg   SpO2 98%   BMI 34.95 kg/m   52:66 AM   68 year old female with history of diabetes, migraine, obesity, hypertension, remote history of breast cancer, presenting with complaints of flulike symptoms.  For the past week patient has had sinus congestion, generalized fatigue, headache, productive cough, decrease in appetite, and overall not feeling well.  States she was seen in the ER several days ago for her complaint.  Symptoms still persist and is not get any better.  Patient does not endorse any fever but does endorse chills.  She denies any shortness of breath, nausea vomit diarrhea or dysuria.  Denies neck stiffness.  Patient has tried some over-the-counter medication at home  without relief.  -labs including CBC, BMP, resp panel, UA considered but not performed -The patient was maintained on a cardiac monitor.  I personally viewed and interpreted the cardiac monitored which showed an underlying rhythm of: NSR -Imaging including CXR considered but will treat preemptively for pna -This patient presents to the ED for concern of cold sxs, this involves an extensive number of treatment  options, and is a complaint that carries with it a high risk of complications and morbidity.  The differential diagnosis includes covid, flu, rsv, pna, viral illness -Co morbidities that complicate the patient evaluation includes DM, HTN -Treatment includes reassurance -Reevaluation of the patient after these medicines showed that the patient stayed the same -PCP office notes or outside notes reviewed -Escalation to admission/observation considered: patient is comfortable with discharge, and will follow up with PCP -Prescription medication considered, patient comfortable with zpak and doxycycline, and tessalon  -Social Determinant of Health considered which includes insufficient physical activity   Given prolonged symptoms of cold symptoms, patient is at risk for superimposed bacterial infection.  I have considered prescribe Augmentin  antibiotic but patient states several months ago she took the antibiotic and she did not tolerate well.  Therefore, will prescribe Z-Pak and doxycycline as treatment.  Encourage patient to follow-up with PCP for further care.  Currently I have low suspicion for any acute emergent medical condition.  Patient is not hypoxic, lungs are clear, symptom is not consistent with PE or malignancy.       Final diagnoses:  Acute cough  Respiratory illness    ED Discharge Orders          Ordered    azithromycin  (ZITHROMAX  Z-PAK) 250 MG tablet        11/26/23 0929    doxycycline (VIBRAMYCIN) 100 MG capsule  2 times daily        11/26/23 0929    benzonatate   (TESSALON ) 100 MG capsule  Every 8 hours        11/26/23 0929               Nivia Colon, PA-C 11/26/23 0930    Neysa Caron PARAS, DO 11/26/23 1511

## 2023-11-26 NOTE — ED Triage Notes (Signed)
 C/o congestion, cough, weakness, headache, chest pain. Productive cough x 1 week. Seen here on 11/4 for same. States she is getting worse.

## 2023-11-27 ENCOUNTER — Encounter: Payer: Self-pay | Admitting: Internal Medicine

## 2023-11-30 ENCOUNTER — Encounter: Payer: Self-pay | Admitting: Internal Medicine

## 2023-12-01 ENCOUNTER — Ambulatory Visit (INDEPENDENT_AMBULATORY_CARE_PROVIDER_SITE_OTHER): Admitting: Internal Medicine

## 2023-12-01 ENCOUNTER — Encounter: Payer: Self-pay | Admitting: Internal Medicine

## 2023-12-01 VITALS — BP 128/78 | HR 89 | Temp 98.5°F | Ht 65.0 in | Wt 203.6 lb

## 2023-12-01 DIAGNOSIS — J988 Other specified respiratory disorders: Secondary | ICD-10-CM

## 2023-12-01 DIAGNOSIS — J189 Pneumonia, unspecified organism: Secondary | ICD-10-CM

## 2023-12-01 DIAGNOSIS — M5442 Lumbago with sciatica, left side: Secondary | ICD-10-CM | POA: Diagnosis not present

## 2023-12-01 DIAGNOSIS — M5441 Lumbago with sciatica, right side: Secondary | ICD-10-CM

## 2023-12-01 DIAGNOSIS — B9789 Other viral agents as the cause of diseases classified elsewhere: Secondary | ICD-10-CM

## 2023-12-01 DIAGNOSIS — Z23 Encounter for immunization: Secondary | ICD-10-CM | POA: Diagnosis not present

## 2023-12-01 MED ORDER — PREDNISONE 10 MG PO TABS: ORAL_TABLET | ORAL | 0 refills | Status: DC

## 2023-12-01 MED ORDER — METHOCARBAMOL 500 MG PO TABS: 500.0000 mg | ORAL_TABLET | Freq: Every evening | ORAL | 0 refills | Status: DC | PRN

## 2023-12-01 NOTE — Progress Notes (Signed)
 Subjective:    Patient ID: Carrie Singh, female    DOB: 05-31-1955, 68 y.o.   MRN: [id number]  HPI  Discussed the use of AI scribe software for clinical note transcription with the patient, who gave verbal consent to proceed.  Carrie Singh is a 68 year old female who presents for multiple ER follow-up for persistent upper respiratory symptoms and new onset low back pain.  She initially presented to the ER on November 4th with upper respiratory symptoms and weakness. Laboratory tests showed a slightly elevated white blood cell count of 12.1, low hemoglobin and hematocrit levels at 11.8 and 25.5 respectively, and decreased kidney function with a creatinine of 1.09 and GFR of 54. COVID, flu, and RSV tests were negative, and a chest x-ray was also negative. She received IV fluids.  She was diagnosed with a viral illness and advised to treat with OTC medication.  On November 9th, she returned to the ER with persistent symptoms. An EKG showed sinus rhythm with a right bundle branch block and a left anterior fascicular block.  No blood, urine or imaging was obtained at that time.  She was prescribed azithromycin , doxycycline, and tessalon  perles for a presumed community-acquired pneumonia. She feels 'some better' with respiratory symptoms, though she still experiences headache, runny nose, and nasal congestion. Her sore throat has resolved since starting antibiotics, and her cough and shortness of breath have improved but are not completely gone. She notes yellow sputum production and had one episode of vomiting but no further nausea, vomiting, or diarrhea. She feels cold all the time but denies fever, chills, or body aches. She is near completion of her antibiotic course.  She reports new onset low back pain that began on the left side and has since moved to the right side, radiating to her abdomen and sometimes down her legs. The pain is described as sharp and stabbing, worsened by movement such as  getting up, turning, or walking. No numbness or tingling, but she notes difficulty walking and needing assistance to get out of bed. She has not had any recent back injuries or surgeries and has been spending more time in a recliner due to her illness. She experienced frequent urination during her initial ER visit but denies current urinary symptoms. She has not taken any over-the-counter medications for her back pain and does not regularly take gabapentin , although she has some available.       Review of Systems     Past Medical History:  Diagnosis Date   Breast cancer (HCC) 01/2021   left breast IDC   Breast cancer, right breast (HCC) 12/2007   a. s/p chemo, radiation, and lumpectomy with negative sentinel lymph node biopsy    Complication of anesthesia    woke up during colonoscopy   Depression    Diabetes mellitus without complication (HCC)    GERD (gastroesophageal reflux disease)    Hyperlipidemia    Hypertension    resistant, negative renal Dopplers and negative CT angiogram   Migraines    Obesity     Current Outpatient Medications  Medication Sig Dispense Refill   ACCU-CHEK GUIDE TEST test strip USE AS DIRECTED TWICE DAILY 200 strip 2   Accu-Chek Softclix Lancets lancets Use 2-3 times daily prn 100 each 12   amLODipine  (NORVASC ) 10 MG tablet TAKE 1 TABLET BY MOUTH DAILY 90 tablet 0   aspirin  EC 81 MG tablet Take 1 tablet (81 mg total) by mouth daily. Swallow whole. 30 tablet  12   azithromycin  (ZITHROMAX  Z-PAK) 250 MG tablet 2 po day one, then 1 daily x 4 days 5 tablet 0   benzonatate  (TESSALON ) 100 MG capsule Take 1 capsule (100 mg total) by mouth every 8 (eight) hours. 21 capsule 0   Biotin w/ Vitamins C & E (HAIR SKIN & NAILS GUMMIES PO) Take by mouth.     Blood Glucose Monitoring Suppl (ACCU-CHEK GUIDE) w/Device KIT 1 kit by Does not apply route in the morning, at noon, and at bedtime. 1 kit 0   carvedilol  (COREG ) 12.5 MG tablet Take 1 tablet (12.5 mg total) by mouth in  the morning and at bedtime. 90 tablet 0   doxycycline (VIBRAMYCIN) 100 MG capsule Take 1 capsule (100 mg total) by mouth 2 (two) times daily. One po bid x 7 days 14 capsule 0   fluticasone  (FLONASE ) 50 MCG/ACT nasal spray Place 2 sprays into both nostrils daily for 7 days. 1 g 0   gabapentin  (NEURONTIN ) 100 MG capsule Take 100 mg by mouth in the morning and at bedtime.     hydrALAZINE  (APRESOLINE ) 100 MG tablet TAKE 1 TABLET BY MOUTH 3 TIMES  DAILY 300 tablet 1   irbesartan  (AVAPRO ) 300 MG tablet TAKE 1 TABLET BY MOUTH DAILY 100 tablet 2   letrozole  (FEMARA ) 2.5 MG tablet TAKE 1 TABLET BY MOUTH DAILY 100 tablet 2   lidocaine  (LIDODERM ) 5 % Place 1 patch onto the skin daily. Remove & Discard patch within 12 hours or as directed by MD 5 patch 0   metFORMIN  (GLUCOPHAGE ) 1000 MG tablet Take 1 tablet (1,000 mg total) by mouth 2 (two) times daily with a meal. 180 tablet 0   Multiple Vitamins-Minerals (ONE-A-DAY WOMENS VITACRAVES) CHEW Chew 1 tablet by mouth daily.      pravastatin  (PRAVACHOL ) 40 MG tablet TAKE 1 TABLET BY MOUTH DAILY 100 tablet 1   repaglinide  (PRANDIN ) 0.5 MG tablet TAKE 1 TABLET BY MOUTH 3 TIMES  DAILY BEFORE MEALS 270 tablet 0   scopolamine  (TRANSDERM-SCOP) 1 MG/3DAYS Place 1 patch (1.5 mg total) onto the skin every 3 (three) days. For motion sickness. Start 2 hr before onset symptoms, up to 12 hr before 10 patch 0   No current facility-administered medications for this visit.    No Known Allergies  Family History  Problem Relation Age of Onset   Stroke Mother    Colon cancer Father 58   Heart disease Father    Asthma Father    Lung cancer Sister    Colon cancer Brother 79   Diabetes Brother    Breast cancer Niece        dx. <50   Breast cancer Niece        dx. <50   Breast cancer Niece        dx. >50   Esophageal cancer Neg Hx    Rectal cancer Neg Hx    Stomach cancer Neg Hx     Social History   Socioeconomic History   Marital status: Married    Spouse name:  Cheila Wickstrom   Number of children: 3   Years of education: 12+   Highest education level: Some college, no degree  Occupational History   Occupation: Disabled  Tobacco Use   Smoking status: Never   Smokeless tobacco: Never  Vaping Use   Vaping status: Never Used  Substance and Sexual Activity   Alcohol use: No    Alcohol/week: 0.0 standard drinks of alcohol   Drug use:  No   Sexual activity: Yes  Other Topics Concern   Not on file  Social History Narrative   Lives at home with husband.   Caffeine  use: 1-2 drinks per month         Epworth Sleepiness Scale = 15 (as of 01/01/15)   Social Drivers of Health   Financial Resource Strain: Low Risk  (08/10/2023)   Overall Financial Resource Strain (CARDIA)    Difficulty of Paying Living Expenses: Not hard at all  Food Insecurity: No Food Insecurity (08/10/2023)   Hunger Vital Sign    Worried About Running Out of Food in the Last Year: Never true    Ran Out of Food in the Last Year: Never true  Transportation Needs: No Transportation Needs (08/10/2023)   PRAPARE - Administrator, Civil Service (Medical): No    Lack of Transportation (Non-Medical): No  Physical Activity: Insufficiently Active (08/10/2023)   Exercise Vital Sign    Days of Exercise per Week: 2 days    Minutes of Exercise per Session: 30 min  Stress: No Stress Concern Present (08/10/2023)   [employer] of Occupational Health - Occupational Stress Questionnaire    Feeling of Stress: Not at all  Social Connections: Socially Integrated (08/10/2023)   Social Connection and Isolation Panel    Frequency of Communication with Friends and Family: More than three times a week    Frequency of Social Gatherings with Friends and Family: More than three times a week    Attends Religious Services: More than 4 times per year    Active Member of [employer] or Organizations: Yes    Attends Banker Meetings: More than 4 times per year    Marital Status: Married   Catering Manager Violence: Not At Risk (08/10/2023)   Humiliation, Afraid, Rape, and Kick questionnaire    Fear of Current or Ex-Partner: No    Emotionally Abused: No    Physically Abused: No    Sexually Abused: No     Constitutional: Patient reports headaches.  Denies fever, malaise, fatigue, or abrupt weight changes.  HEENT: Pt reports runny nose, nasal congestion. Denies eye pain, eye redness, ear pain, ringing in the ears, wax buildup, runny nose, nasal congestion, bloody nose, or sore throat. Respiratory: Pt reports cough and shortness of breath. Denies difficulty breathing.   Cardiovascular: Denies chest pain, chest tightness, palpitations or swelling in the hands or feet.  Gastrointestinal: Pt reports 1 episode of vomiting, abdominal pain. Denies bloating, constipation, diarrhea or blood in the stool.  GU: Patient reports urge incontinence.  Denies urgency, frequency, pain with urination, burning sensation, blood in urine, odor or discharge. Musculoskeletal: Patient reports low back pain, bilateral leg pain.  Denies decrease in range of motion, muscle pain or joint swelling.  Skin: Denies redness, rashes, lesions or ulcercations.  Neurological: Denies numbness, tingling, weakness or problems with balance and coordination.    No other specific complaints in a complete review of systems (except as listed in HPI above).  Objective:   Physical Exam BP 128/78 (BP Location: Right Arm, Patient Position: Sitting, Cuff Size: Large)   Pulse 89   Temp 98.5 F (36.9 C)   Ht 5' 5 (1.651 m)   Wt 203 lb 9.6 oz (92.4 kg)   SpO2 98%   BMI 33.88 kg/m     Wt Readings from Last 3 Encounters:  11/26/23 210 lb (95.3 kg)  11/21/23 210 lb (95.3 kg)  09/17/23 206 lb (93.4 kg)  General: Appears her stated age, obese, in NAD. Skin: Warm, dry and intact.  HEENT: Head: normal shape and size, no sinus pressure noted; Eyes: sclera white, no icterus, conjunctiva pink, PERRLA and EOMs intact;  Nose: mucosa boggy and moist, turbinates swollen; Throat: mucosa pink and moist, + PND, no tonsillar enlargement, exudate or lesions noted. Neck: No adenopathy noted. Cardiovascular: Normal rate and rhythm. S1,S2 noted.  No murmur, rubs or gallops noted. No JVD or BLE edema. No carotid bruits noted. Pulmonary/Chest: Normal effort and positive vesicular breath sounds. No respiratory distress. No wheezes, rales or ronchi noted.  Abdomen: Soft and nontender.  No CVA tenderness noted. Musculoskeletal: She has difficulty getting from a sitting to a standing position and is very unsteady upon standing even with a walker.  No pain with palpation over the lumbar spine.  Normal flexion, extension, rotation and lateral bending of the spine.  Strength 2/5 RLE, 4/5 LLE.  Gait slow and slightly unsteady with the use of a walker. Neurological: Alert and oriented.  Negative SLR bilaterally.   BMET    Component Value Date/Time   NA 137 11/21/2023 2340   NA 146 (H) 01/20/2015 1457   NA 143 10/12/2013 1510   K 3.8 11/21/2023 2340   K 3.9 10/12/2013 1510   CL 102 11/21/2023 2340   CL 109 (H) 10/12/2013 1510   CO2 21 (L) 11/21/2023 2340   CO2 29 10/12/2013 1510   GLUCOSE 189 (H) 11/21/2023 2340   GLUCOSE 92 10/12/2013 1510   BUN 14 11/21/2023 2340   BUN 17 01/20/2015 1457   BUN 16 10/12/2013 1510   CREATININE 1.09 (H) 11/21/2023 2340   CREATININE 0.97 08/11/2023 0911   CALCIUM 9.0 11/21/2023 2340   CALCIUM 9.4 10/12/2013 1510   GFRNONAA 54 (L) 11/21/2023 2340   GFRNONAA 46 (L) 05/18/2023 1527   GFRNONAA 56 (L) 10/12/2013 1510   GFRAA >60 01/24/2018 2159   GFRAA >60 10/12/2013 1510    Lipid Panel     Component Value Date/Time   CHOL 183 08/11/2023 0911   CHOL 179 10/17/2014 1527   TRIG 92 08/11/2023 0911   HDL 80 08/11/2023 0911   HDL 59 10/17/2014 1527   CHOLHDL 2.3 08/11/2023 0911   VLDL 15.2 01/09/2020 0957   LDLCALC 84 08/11/2023 0911    CBC    Component Value Date/Time   WBC 12.1  (H) 11/21/2023 2340   RBC 4.00 11/21/2023 2340   HGB 11.8 (L) 11/21/2023 2340   HGB 12.2 05/18/2023 1527   HGB 13.5 10/12/2013 1510   HGB 12.8 08/13/2009 0946   HCT 35.5 (L) 11/21/2023 2340   HCT 40.5 10/12/2013 1510   HCT 36.8 08/13/2009 0946   PLT 227 11/21/2023 2340   PLT 257 05/18/2023 1527   PLT 278 10/12/2013 1510   PLT 271 08/13/2009 0946   MCV 88.8 11/21/2023 2340   MCV 87 10/12/2013 1510   MCV 87.0 08/13/2009 0946   MCH 29.5 11/21/2023 2340   MCHC 33.2 11/21/2023 2340   RDW 13.1 11/21/2023 2340   RDW 13.6 10/12/2013 1510   RDW 13.8 08/13/2009 0946   LYMPHSABS 1.9 05/18/2023 1527   LYMPHSABS 1.5 08/13/2009 0946   MONOABS 0.3 05/18/2023 1527   MONOABS 0.2 08/13/2009 0946   EOSABS 0.1 05/18/2023 1527   EOSABS 0.1 08/13/2009 0946   BASOSABS 0.0 05/18/2023 1527   BASOSABS 0.0 08/13/2009 0946    Hgb A1C Lab Results  Component Value Date   HGBA1C 5.9 (H) 08/11/2023  Assessment & Plan:   Assessment and Plan    ER follow-up for viral respiratory illness, presumed community-acquired pneumonia ER notes, labs and imaging reviewed diagnosed with pneumonia on November 9th, 2025 although she had a negative chest x-ray. Symptoms improved with azithromycin , doxycycline, and tessalon  perles. Persistent yellow nasal discharge and occasional cough remain. - Continue current antibiotics for one more day. - No indication for additional antibiotics or repeat imaging at this time  Acute lower back pain with radicular symptoms Acute lower back pain with radicular symptoms, now on the right side, radiating to the abdomen and legs. Muscular origin suspected. - Prescribed prednisone  10 mg taper for 6 days. - Prescribed methocarbamol  500 mg at bedtime-sedation caution given. - Advised gabapentin  100 mg at bedtime if needed-sedation caution given. - Advised against checking blood sugars during prednisone  treatment. - Instructed to return to ER if pain worsens over the  weekend.        RTC in 2 months for your annual exam Angeline Laura, NP

## 2023-12-01 NOTE — Patient Instructions (Signed)

## 2023-12-24 ENCOUNTER — Other Ambulatory Visit: Payer: Self-pay | Admitting: Internal Medicine

## 2023-12-24 DIAGNOSIS — I10 Essential (primary) hypertension: Secondary | ICD-10-CM

## 2023-12-25 ENCOUNTER — Other Ambulatory Visit: Payer: Self-pay

## 2023-12-25 DIAGNOSIS — I10 Essential (primary) hypertension: Secondary | ICD-10-CM

## 2023-12-25 MED ORDER — METFORMIN HCL 1000 MG PO TABS: 1000.0000 mg | ORAL_TABLET | Freq: Two times a day (BID) | ORAL | 0 refills | Status: DC

## 2023-12-25 MED ORDER — REPAGLINIDE 0.5 MG PO TABS: 0.5000 mg | ORAL_TABLET | Freq: Three times a day (TID) | ORAL | 0 refills | Status: DC

## 2023-12-25 MED ORDER — PRAVASTATIN SODIUM 40 MG PO TABS: 40.0000 mg | ORAL_TABLET | Freq: Every day | ORAL | 1 refills | Status: DC

## 2023-12-25 MED ORDER — HYDRALAZINE HCL 100 MG PO TABS: 100.0000 mg | ORAL_TABLET | Freq: Three times a day (TID) | ORAL | 1 refills | Status: DC

## 2023-12-25 MED ORDER — CARVEDILOL 12.5 MG PO TABS: 12.5000 mg | ORAL_TABLET | Freq: Two times a day (BID) | ORAL | 0 refills | Status: DC

## 2023-12-25 MED ORDER — AMLODIPINE BESYLATE 10 MG PO TABS: 10.0000 mg | ORAL_TABLET | Freq: Every day | ORAL | 0 refills | Status: DC

## 2023-12-25 MED ORDER — IRBESARTAN 300 MG PO TABS: 300.0000 mg | ORAL_TABLET | Freq: Every day | ORAL | 2 refills | Status: DC

## 2023-12-26 NOTE — Telephone Encounter (Signed)
 Requested Prescriptions  Pending Prescriptions Disp Refills   repaglinide  (PRANDIN ) 0.5 MG tablet [Pharmacy Med Name: REPAGLINIDE   0.5MG   TAB] 270 tablet 3    Sig: TAKE 1 TABLET BY MOUTH 3 TIMES  DAILY BEFORE MEALS     Endocrinology:  Diabetes - Meglitinides Failed - 12/26/2023  3:33 PM      Failed - Cr in normal range and within 360 days    Creat  Date Value Ref Range Status  08/11/2023 0.97 0.60 - 1.00 mg/dL Final   Creatinine, Ser  Date Value Ref Range Status  11/21/2023 1.09 (H) 0.44 - 1.00 mg/dL Final   Creatinine, Urine  Date Value Ref Range Status  01/19/2023 132 20 - 275 mg/dL Final         Passed - HBA1C is between 0 and 7.9 and within 180 days    Hgb A1c MFr Bld  Date Value Ref Range Status  08/11/2023 5.9 (H) <5.7 % Final    Comment:    For someone without known diabetes, a hemoglobin  A1c value between 5.7% and 6.4% is consistent with prediabetes and should be confirmed with a  follow-up test. . For someone with known diabetes, a value <7% indicates that their diabetes is well controlled. A1c targets should be individualized based on duration of diabetes, age, comorbid conditions, and other considerations. . This assay result is consistent with an increased risk of diabetes. . Currently, no consensus exists regarding use of hemoglobin A1c for diagnosis of diabetes for children. SABRA Amy - Valid encounter within last 6 months    Recent Outpatient Visits           3 weeks ago Viral respiratory illness   [place] [facility] [place], [place] W, NP   4 months ago Type 2 diabetes mellitus with hyperglycemia, without long-term current use of insulin  [facility])   [facility] [place], Angeline ORN, NP   8 months ago Carpal tunnel syndrome on left   [facility] [place], Angeline ORN, NP               amLODipine  (NORVASC ) 10 MG tablet [Pharmacy Med Name: amLODIPine  Besylate 10 MG Oral  Tablet] 90 tablet 3    Sig: TAKE 1 TABLET BY MOUTH DAILY     Cardiovascular: Calcium Channel Blockers 2 Passed - 12/26/2023  3:33 PM      Passed - Last BP in normal range    BP Readings from Last 1 Encounters:  12/01/23 128/78         Passed - Last Heart Rate in normal range    Pulse Readings from Last 1 Encounters:  12/01/23 89         Passed - Valid encounter within last 6 months    Recent Outpatient Visits           3 weeks ago Viral respiratory illness   [facility] [place], [place] W, NP   4 months ago Type 2 diabetes mellitus with hyperglycemia, without long-term current use of insulin  [facility])   [place] [facility] [place], Angeline ORN, NP   8 months ago Carpal tunnel syndrome on left   [facility] [place], Angeline ORN, [place]

## 2024-01-01 ENCOUNTER — Inpatient Hospital Stay: Payer: Medicare Other | Attending: Hematology and Oncology | Admitting: Hematology and Oncology

## 2024-01-01 VITALS — BP 124/67 | HR 84 | Temp 97.7°F | Resp 17 | Ht 65.0 in | Wt 212.4 lb

## 2024-01-01 DIAGNOSIS — Z79899 Other long term (current) drug therapy: Secondary | ICD-10-CM | POA: Diagnosis not present

## 2024-01-01 DIAGNOSIS — Z853 Personal history of malignant neoplasm of breast: Secondary | ICD-10-CM | POA: Insufficient documentation

## 2024-01-01 DIAGNOSIS — R531 Weakness: Secondary | ICD-10-CM | POA: Diagnosis not present

## 2024-01-01 DIAGNOSIS — Z9013 Acquired absence of bilateral breasts and nipples: Secondary | ICD-10-CM | POA: Diagnosis not present

## 2024-01-01 DIAGNOSIS — R5383 Other fatigue: Secondary | ICD-10-CM | POA: Insufficient documentation

## 2024-01-01 DIAGNOSIS — Z08 Encounter for follow-up examination after completed treatment for malignant neoplasm: Secondary | ICD-10-CM | POA: Insufficient documentation

## 2024-01-01 DIAGNOSIS — Z7982 Long term (current) use of aspirin: Secondary | ICD-10-CM | POA: Diagnosis not present

## 2024-01-01 DIAGNOSIS — J45909 Unspecified asthma, uncomplicated: Secondary | ICD-10-CM | POA: Insufficient documentation

## 2024-01-01 NOTE — Assessment & Plan Note (Signed)
 02/02/2016: Right simple mastectomy: Grade 2 IDC with DCIS 1.5 cm, margins -0/3 lymph nodes, T1b N0 stage IA, ER 100%, PR 90%, Ki-67 10%, HER-2 negative ratio 1.28 Oncotype DX score 11:7% risk of recurrence (2009 right breast cancer treated with lumpectomy radiation and 5 years of antiestrogen therapy)   Current treatment: Letrozole  2.5 mg daily started 04/2016 switched to exemestane  05/26/2016 switched to anastrozole , switched to letrozole  03/18/2021   Breast cancer recurrence 1. Mammogram left breast: With ultrasound 02/02/2021: 0.6 cm irregular hypoechoic mass: Biopsy: Grade 2 invasive ductal carcinoma ER 100%, PR 0%, HER2 equivocal, FISH negative 2.  Left Mastectomy : Grade 2 IDC 1.1 cm, 0/8 LN neg, ER 100%, PR: 0%, ki 67: 15%, HER 2 neg 3. Followed by adjuvant antiestrogen therapy.   Letrozole .   Letrozole  toxicities: Tolerating extremely well without any problems or concerns.  Breast cancer surveillance: Breast exam 01/01/2024: Benign Mammograms will need to be done. Guardant reveal for MRD testing June 2025: 0%    Return to clinic in 1 year for follow-up

## 2024-01-01 NOTE — Progress Notes (Signed)
 Patient Care Team: Antonette Angeline ORN, NP as PCP - General (Internal Medicine) Perla Evalene PARAS, MD as Consulting Physician (Cardiology) Odean Potts, MD as Consulting Physician (Hematology and Oncology) Crawford, Morna Pickle, NP as Nurse Practitioner (Hematology and Oncology) Vanderbilt Ned, MD as Consulting Physician (General Surgery) Douglas Rule, MD as Consulting Physician (Nephrology)  DIAGNOSIS:  Encounter Diagnosis  Name Primary?   History of left breast cancer Yes    SUMMARY OF ONCOLOGIC HISTORY: Oncology History  History of left breast cancer  12/18/2007 Initial Diagnosis   Right breast cancer treated with lumpectomy followed by radiation and 5 years of antiestrogen therapy with aromatase inhibitor   02/02/2016 Relapse/Recurrence   Right breast biopsy 6:00 position: IDC with DCIS grade 2, ER 100%, PR 90%, Ki-67 10%, HER-2 negative ratio 1.28, screening detected right breast lumps 0.5 cm and 0.3 cm, T1a N0 stage IA clinical stage   03/18/2016 Surgery   Right simple mastectomy: Grade 2 IDC with DCIS 1.5 cm, margins -0/3 lymph nodes, T1b N0 stage IA, ER 100%, PR 90%, Ki-67 10%, HER-2 negative ratio 1.28   03/18/2016 Oncotype testing   11/7%   04/2016 -  Anti-estrogen oral therapy   Letrozole  2.5mg  daily switch to exemestane  25 mg daily May 2018 switched to anastrozole  1 mg daily due to cost   03/10/2021 Surgery   Left Mastectomy : Grade 2 IDC 1.1 cm, 0/8 LN neg, ER 100%, PR: 0%, ki 67: 15%, HER 2 neg      04/2021 -  Anti-estrogen oral therapy   Letrozole  daily    Genetic Testing   [facility] CancerNext-Expanded Panel was Negative. Report date is 08/09/2021.  The CancerNext-Expanded gene panel offered by [facility] and includes sequencing, rearrangement, and RNA analysis for the following 77 genes: AIP, ALK, APC, ATM, AXIN2, BAP1, BARD1, BLM, BMPR1A, BRCA1, BRCA2, BRIP1, CDC73, CDH1, CDK4, CDKN1B, CDKN2A, CHEK2, CTNNA1, DICER1, FANCC, FH, FLCN, GALNT12, KIF1B, LZTR1,  MAX, MEN1, MET, MLH1, MSH2, MSH3, MSH6, MUTYH, NBN, NF1, NF2, NTHL1, PALB2, PHOX2B, PMS2, POT1, PRKAR1A, PTCH1, PTEN, RAD51C, RAD51D, RB1, RECQL, RET, SDHA, SDHAF2, SDHB, SDHC, SDHD, SMAD4, SMARCA4, SMARCB1, SMARCE1, STK11, SUFU, TMEM127, TP53, TSC1, TSC2, VHL and XRCC2 (sequencing and deletion/duplication); EGFR, EGLN1, HOXB13, KIT, MITF, PDGFRA, POLD1, and POLE (sequencing only); EPCAM and GREM1 (deletion/duplication only).    Malignant neoplasm of left breast in female, estrogen receptor positive (HCC) (Resolved)  02/18/2021 Initial Diagnosis   Malignant neoplasm of left breast in female, estrogen receptor positive (HCC)   03/10/2021 Cancer Staging   Staging form: Breast, AJCC 8th Edition - Pathologic stage from 03/10/2021: Stage IA (pT1c, pN0, cM0, G2, ER+, PR-, HER2-) - Signed by Crawford Morna Pickle, NP on 06/21/2021 Stage prefix: Initial diagnosis Histologic grading system: 3 grade system    Genetic Testing   [facility] CancerNext-Expanded Panel was Negative. Report date is 08/09/2021.  The CancerNext-Expanded gene panel offered by [facility] and includes sequencing, rearrangement, and RNA analysis for the following 77 genes: AIP, ALK, APC, ATM, AXIN2, BAP1, BARD1, BLM, BMPR1A, BRCA1, BRCA2, BRIP1, CDC73, CDH1, CDK4, CDKN1B, CDKN2A, CHEK2, CTNNA1, DICER1, FANCC, FH, FLCN, GALNT12, KIF1B, LZTR1, MAX, MEN1, MET, MLH1, MSH2, MSH3, MSH6, MUTYH, NBN, NF1, NF2, NTHL1, PALB2, PHOX2B, PMS2, POT1, PRKAR1A, PTCH1, PTEN, RAD51C, RAD51D, RB1, RECQL, RET, SDHA, SDHAF2, SDHB, SDHC, SDHD, SMAD4, SMARCA4, SMARCB1, SMARCE1, STK11, SUFU, TMEM127, TP53, TSC1, TSC2, VHL and XRCC2 (sequencing and deletion/duplication); EGFR, EGLN1, HOXB13, KIT, MITF, PDGFRA, POLD1, and POLE (sequencing only); EPCAM and GREM1 (deletion/duplication only).      CHIEF  COMPLIANT: Surveillance of breast cancer  HISTORY OF PRESENT ILLNESS:   History of Present Illness Carrie Singh is a 68 year old female with recurrent, bilateral,  ER-positive, HER2-negative, node-negative, grade 2 invasive ductal carcinoma of the breast who presents for routine oncology follow-up and surveillance.  She has three prior breast cancer diagnoses: right breast in 2009 (T1aN0, grade 2, ER/PR positive, HER2 negative), right breast recurrence in 2018 (T1bN0, grade 2, ER/PR positive, HER2 negative), and left breast in 2023 (T1cN0, grade 2, ER positive, PR negative, HER2 negative). Treatments have included right lumpectomy with adjuvant radiation and aromatase inhibitor therapy in 2009, right mastectomy in 2018, and left mastectomy in 2023. She has been on adjuvant aromatase inhibitors since 2009 and on letrozole  since 2018, with ongoing surveillance using blood-based MRD testing (Guardant).  She continues letrozole  and attributes persistent fatigue and weakness to antiestrogen therapy. She denies hot flashes and new breast symptoms.  She recently had bronchitis and asthma with chest congestion after travel and completed antibiotics and prednisone . She requests removal of these from her active medication list. No new pulmonary symptoms that affect her cancer management were discussed today.  She is concerned about the safety of hair care products and possible cancer risk. She avoids chemical hair treatments and is considering a product called Polar after confirming safety with her oncologist. She is cautious about new exposures that might increase cancer risk.  She is active in survivorship advocacy, including writing about her experience and participating in survivor programs, and remains engaged in her care. She has changed insurance for the upcoming year and will notify the office about any medication renewal needs if access issues arise.       ALLERGIES:  has no known allergies.  MEDICATIONS:  Current Outpatient Medications  Medication Sig Dispense Refill   ACCU-CHEK GUIDE TEST test strip USE AS DIRECTED TWICE DAILY 200 strip 2   Accu-Chek  Softclix Lancets lancets Use 2-3 times daily prn 100 each 12   amLODipine  (NORVASC ) 10 MG tablet Take 1 tablet (10 mg total) by mouth daily. 90 tablet 0   aspirin  EC 81 MG tablet Take 1 tablet (81 mg total) by mouth daily. Swallow whole. 30 tablet 12   azithromycin  (ZITHROMAX  Z-PAK) 250 MG tablet 2 po day one, then 1 daily x 4 days (Patient not taking: Reported on 12/01/2023) 5 tablet 0   benzonatate  (TESSALON ) 100 MG capsule Take 1 capsule (100 mg total) by mouth every 8 (eight) hours. 21 capsule 0   Biotin w/ Vitamins C & E (HAIR SKIN & NAILS GUMMIES PO) Take by mouth.     Blood Glucose Monitoring Suppl (ACCU-CHEK GUIDE) w/Device KIT 1 kit by Does not apply route in the morning, at noon, and at bedtime. 1 kit 0   carvedilol  (COREG ) 12.5 MG tablet Take 1 tablet (12.5 mg total) by mouth in the morning and at bedtime. 90 tablet 0   doxycycline  (VIBRAMYCIN ) 100 MG capsule Take 1 capsule (100 mg total) by mouth 2 (two) times daily. One po bid x 7 days 14 capsule 0   fluticasone  (FLONASE ) 50 MCG/ACT nasal spray Place 2 sprays into both nostrils daily for 7 days. 1 g 0   gabapentin  (NEURONTIN ) 100 MG capsule Take 100 mg by mouth in the morning and at bedtime.     hydrALAZINE  (APRESOLINE ) 100 MG tablet Take 1 tablet (100 mg total) by mouth 3 (three) times daily. 300 tablet 1   irbesartan  (AVAPRO ) 300 MG tablet Take 1 tablet (300  mg total) by mouth daily. 100 tablet 2   letrozole  (FEMARA ) 2.5 MG tablet TAKE 1 TABLET BY MOUTH DAILY 100 tablet 2   lidocaine  (LIDODERM ) 5 % Place 1 patch onto the skin daily. Remove & Discard patch within 12 hours or as directed by MD 5 patch 0   metFORMIN  (GLUCOPHAGE ) 1000 MG tablet Take 1 tablet (1,000 mg total) by mouth 2 (two) times daily with a meal. 180 tablet 0   methocarbamol  (ROBAXIN ) 500 MG tablet Take 1 tablet (500 mg total) by mouth at bedtime as needed. 20 tablet 0   Multiple Vitamins-Minerals (ONE-A-DAY WOMENS VITACRAVES) CHEW Chew 1 tablet by mouth daily.       pravastatin  (PRAVACHOL ) 40 MG tablet Take 1 tablet (40 mg total) by mouth daily. 100 tablet 1   predniSONE  (DELTASONE ) 10 MG tablet Take 6 tabs on day 1, 5 tabs on day 2, 4 tabs on day 3, 3 tabs on day 4, 2 tabs on day 5, 1 tab on day 6 21 tablet 0   repaglinide  (PRANDIN ) 0.5 MG tablet Take 1 tablet (0.5 mg total) by mouth 3 (three) times daily before meals. 270 tablet 0   scopolamine  (TRANSDERM-SCOP) 1 MG/3DAYS Place 1 patch (1.5 mg total) onto the skin every 3 (three) days. For motion sickness. Start 2 hr before onset symptoms, up to 12 hr before 10 patch 0   No current facility-administered medications for this visit.    PHYSICAL EXAMINATION: ECOG PERFORMANCE STATUS: 1 - Symptomatic but completely ambulatory  Vitals:   01/01/24 1014 01/01/24 1015  BP: (!) 142/68 124/67  Pulse: 84   Resp: 17   Temp: 97.7 F (36.5 C)   SpO2: 100%    Filed Weights   01/01/24 1014  Weight: 212 lb 6.4 oz (96.3 kg)      LABORATORY DATA:  I have reviewed the data as listed    Latest Ref Rng & Units 11/21/2023   11:40 PM 08/11/2023    9:11 AM 05/18/2023    3:27 PM  CMP  Glucose 70 - 99 mg/dL 810  80  75   BUN 8 - 23 mg/dL 14  16  19    Creatinine 0.44 - 1.00 mg/dL 8.90  9.02  8.73   Sodium 135 - 145 mmol/L 137  142  139   Potassium 3.5 - 5.1 mmol/L 3.8  4.3  4.7   Chloride 98 - 111 mmol/L 102  105  102   CO2 22 - 32 mmol/L 21  29  31    Calcium 8.9 - 10.3 mg/dL 9.0  9.7  9.6   Total Protein 6.5 - 8.1 g/dL 6.7  7.0  7.2   Total Bilirubin 0.0 - 1.2 mg/dL 0.9  1.0  0.7   Alkaline Phos 38 - 126 U/L 70   55   AST 15 - 41 U/L 13  11  13    ALT 0 - 44 U/L 8  11  11      Lab Results  Component Value Date   WBC 12.1 (H) 11/21/2023   HGB 11.8 (L) 11/21/2023   HCT 35.5 (L) 11/21/2023   MCV 88.8 11/21/2023   PLT 227 11/21/2023   NEUTROABS 2.0 05/18/2023    ASSESSMENT & PLAN:  History of left breast cancer 02/02/2016: Right simple mastectomy: Grade 2 IDC with DCIS 1.5 cm, margins -0/3 lymph nodes,  T1b N0 stage IA, ER 100%, PR 90%, Ki-67 10%, HER-2 negative ratio 1.28 Oncotype DX score 11:7% risk of recurrence (2009  right breast cancer treated with lumpectomy radiation and 5 years of antiestrogen therapy)   Current treatment: Letrozole  2.5 mg daily started 04/2016 switched to exemestane  05/26/2016 switched to anastrozole , switched to letrozole  03/18/2021   Breast cancer recurrence 1. Mammogram left breast: With ultrasound 02/02/2021: 0.6 cm irregular hypoechoic mass: Biopsy: Grade 2 invasive ductal carcinoma ER 100%, PR 0%, HER2 equivocal, FISH negative 2.  Left Mastectomy : Grade 2 IDC 1.1 cm, 0/8 LN neg, ER 100%, PR: 0%, ki 67: 15%, HER 2 neg 3. Followed by adjuvant antiestrogen therapy.   Letrozole .   Letrozole  toxicities: Tolerating extremely well without any problems or concerns.  Breast cancer surveillance: Breast exam 01/01/2024: Benign Mammograms will need to be done. Guardant reveal for MRD testing June 2025: 0%    Return to clinic in 1 year for follow-up     No orders of the defined types were placed in this encounter.  The patient has a good understanding of the overall plan. she agrees with it. she will call with any problems that may develop before the next visit here.  I personally spent a total of 30 minutes in the care of the patient today including preparing to see the patient, getting/reviewing separately obtained history, performing a medically appropriate exam/evaluation, counseling and educating, placing orders, referring and communicating with other health care professionals, documenting clinical information in the EHR, independently interpreting results, communicating results, and coordinating care.   Viinay K Herald Vallin, MD 01/01/2024

## 2024-01-10 ENCOUNTER — Encounter: Payer: Self-pay | Admitting: Hematology and Oncology

## 2024-01-16 ENCOUNTER — Encounter: Payer: Self-pay | Admitting: Hematology and Oncology

## 2024-01-16 ENCOUNTER — Encounter: Payer: Self-pay | Admitting: Internal Medicine

## 2024-01-16 DIAGNOSIS — I10 Essential (primary) hypertension: Secondary | ICD-10-CM

## 2024-01-16 MED ORDER — METHOCARBAMOL 500 MG PO TABS: 500.0000 mg | ORAL_TABLET | Freq: Every evening | ORAL | 0 refills | Status: DC | PRN

## 2024-01-16 MED ORDER — AMLODIPINE BESYLATE 10 MG PO TABS: 10.0000 mg | ORAL_TABLET | Freq: Every day | ORAL | 0 refills | Status: DC

## 2024-01-16 MED ORDER — METFORMIN HCL 1000 MG PO TABS: 1000.0000 mg | ORAL_TABLET | Freq: Two times a day (BID) | ORAL | 0 refills | Status: AC

## 2024-01-16 MED ORDER — REPAGLINIDE 0.5 MG PO TABS: 0.5000 mg | ORAL_TABLET | Freq: Three times a day (TID) | ORAL | 0 refills | Status: AC

## 2024-01-16 MED ORDER — IRBESARTAN 300 MG PO TABS: 300.0000 mg | ORAL_TABLET | Freq: Every day | ORAL | 2 refills | Status: AC

## 2024-01-16 MED ORDER — CARVEDILOL 12.5 MG PO TABS: 12.5000 mg | ORAL_TABLET | Freq: Two times a day (BID) | ORAL | 0 refills | Status: AC

## 2024-01-16 MED ORDER — PRAVASTATIN SODIUM 40 MG PO TABS: 40.0000 mg | ORAL_TABLET | Freq: Every day | ORAL | 1 refills | Status: AC

## 2024-01-16 MED ORDER — HYDRALAZINE HCL 100 MG PO TABS: 100.0000 mg | ORAL_TABLET | Freq: Three times a day (TID) | ORAL | 1 refills | Status: AC

## 2024-01-17 ENCOUNTER — Other Ambulatory Visit: Payer: Self-pay

## 2024-01-17 MED ORDER — LETROZOLE 2.5 MG PO TABS: 2.5000 mg | ORAL_TABLET | Freq: Every day | ORAL | 3 refills | Status: AC

## 2024-01-22 ENCOUNTER — Encounter: Payer: Self-pay | Admitting: Internal Medicine

## 2024-01-22 ENCOUNTER — Ambulatory Visit (INDEPENDENT_AMBULATORY_CARE_PROVIDER_SITE_OTHER): Admitting: Internal Medicine

## 2024-01-22 VITALS — BP 124/74 | Ht 65.0 in | Wt 207.6 lb

## 2024-01-22 DIAGNOSIS — Z7984 Long term (current) use of oral hypoglycemic drugs: Secondary | ICD-10-CM

## 2024-01-22 DIAGNOSIS — E66811 Obesity, class 1: Secondary | ICD-10-CM

## 2024-01-22 DIAGNOSIS — Z780 Asymptomatic menopausal state: Secondary | ICD-10-CM

## 2024-01-22 DIAGNOSIS — E1165 Type 2 diabetes mellitus with hyperglycemia: Secondary | ICD-10-CM

## 2024-01-22 DIAGNOSIS — Z0001 Encounter for general adult medical examination with abnormal findings: Secondary | ICD-10-CM | POA: Diagnosis not present

## 2024-01-22 DIAGNOSIS — Z6834 Body mass index (BMI) 34.0-34.9, adult: Secondary | ICD-10-CM

## 2024-01-22 DIAGNOSIS — E6609 Other obesity due to excess calories: Secondary | ICD-10-CM

## 2024-01-22 MED ORDER — AMLODIPINE BESYLATE 10 MG PO TABS: 10.0000 mg | ORAL_TABLET | Freq: Every day | ORAL | 0 refills | Status: AC

## 2024-01-22 NOTE — Assessment & Plan Note (Signed)
 Encourage diet and exercise for weight loss

## 2024-01-22 NOTE — Patient Instructions (Signed)
 Health Maintenance for Postmenopausal Women Menopause is a normal process in which your ability to get pregnant comes to an end. This process happens slowly over many months or years, usually between the ages of 76 and 38. Menopause is complete when you have missed your menstrual period for 12 months. It is important to talk with your health care provider about some of the most common conditions that affect women after menopause (postmenopausal women). These include heart disease, cancer, and bone loss (osteoporosis). Adopting a healthy lifestyle and getting preventive care can help to promote your health and wellness. The actions you take can also lower your chances of developing some of these common conditions. What are the signs and symptoms of menopause? During menopause, you may have the following symptoms: Hot flashes. These can be moderate or severe. Night sweats. Decrease in sex drive. Mood swings. Headaches. Tiredness (fatigue). Irritability. Memory problems. Problems falling asleep or staying asleep. Talk with your health care provider about treatment options for your symptoms. Do I need hormone replacement therapy? Hormone replacement therapy is effective in treating symptoms that are caused by menopause, such as hot flashes and night sweats. Hormone replacement carries certain risks, especially as you become older. If you are thinking about using estrogen or estrogen with progestin, discuss the benefits and risks with your health care provider. How can I reduce my risk for heart disease and stroke? The risk of heart disease, heart attack, and stroke increases as you age. One of the causes may be a change in the body's hormones during menopause. This can affect how your body uses dietary fats, triglycerides, and cholesterol. Heart attack and stroke are medical emergencies. There are many things that you can do to help prevent heart disease and stroke. Watch your blood pressure High  blood pressure causes heart disease and increases the risk of stroke. This is more likely to develop in people who have high blood pressure readings or are overweight. Have your blood pressure checked: Every 3-5 years if you are 32-23 years of age. Every year if you are 31 years old or older. Eat a healthy diet  Eat a diet that includes plenty of vegetables, fruits, low-fat dairy products, and lean protein. Do not eat a lot of foods that are high in solid fats, added sugars, or sodium. Get regular exercise Get regular exercise. This is one of the most important things you can do for your health. Most adults should: Try to exercise for at least 150 minutes each week. The exercise should increase your heart rate and make you sweat (moderate-intensity exercise). Try to do strengthening exercises at least twice each week. Do these in addition to the moderate-intensity exercise. Spend less time sitting. Even light physical activity can be beneficial. Other tips Work with your health care provider to achieve or maintain a healthy weight. Do not use any products that contain nicotine or tobacco. These products include cigarettes, chewing tobacco, and vaping devices, such as e-cigarettes. If you need help quitting, ask your health care provider. Know your numbers. Ask your health care provider to check your cholesterol and your blood sugar (glucose). Continue to have your blood tested as directed by your health care provider. Do I need screening for cancer? Depending on your health history and family history, you may need to have cancer screenings at different stages of your life. This may include screening for: Breast cancer. Cervical cancer. Lung cancer. Colorectal cancer. What is my risk for osteoporosis? After menopause, you may be  at increased risk for osteoporosis. Osteoporosis is a condition in which bone destruction happens more quickly than new bone creation. To help prevent osteoporosis or  the bone fractures that can happen because of osteoporosis, you may take the following actions: If you are 24-54 years old, get at least 1,000 mg of calcium and at least 600 international units (IU) of vitamin D  per day. If you are older than age 75 but younger than age 30, get at least 1,200 mg of calcium and at least 600 international units (IU) of vitamin D  per day. If you are older than age 8, get at least 1,200 mg of calcium and at least 800 international units (IU) of vitamin D  per day. Smoking and drinking excessive alcohol increase the risk of osteoporosis. Eat foods that are rich in calcium and vitamin D , and do weight-bearing exercises several times each week as directed by your health care provider. How does menopause affect my mental health? Depression may occur at any age, but it is more common as you become older. Common symptoms of depression include: Feeling depressed. Changes in sleep patterns. Changes in appetite or eating patterns. Feeling an overall lack of motivation or enjoyment of activities that you previously enjoyed. Frequent crying spells. Talk with your health care provider if you think that you are experiencing any of these symptoms. General instructions See your health care provider for regular wellness exams and vaccines. This may include: Scheduling regular health, dental, and eye exams. Getting and maintaining your vaccines. These include: Influenza vaccine. Get this vaccine each year before the flu season begins. Pneumonia vaccine. Shingles vaccine. Tetanus, diphtheria, and pertussis (Tdap) booster vaccine. Your health care provider may also recommend other immunizations. Tell your health care provider if you have ever been abused or do not feel safe at home. Summary Menopause is a normal process in which your ability to get pregnant comes to an end. This condition causes hot flashes, night sweats, decreased interest in sex, mood swings, headaches, or lack  of sleep. Treatment for this condition may include hormone replacement therapy. Take actions to keep yourself healthy, including exercising regularly, eating a healthy diet, watching your weight, and checking your blood pressure and blood sugar levels. Get screened for cancer and depression. Make sure that you are up to date with all your vaccines. This information is not intended to replace advice given to you by your health care provider. Make sure you discuss any questions you have with your health care provider. Document Revised: 05/25/2020 Document Reviewed: 05/25/2020 Elsevier Patient Education  2024 [employer].

## 2024-01-22 NOTE — Progress Notes (Signed)
 "  Subjective:    Patient ID: Carrie Singh, female    DOB: April 21, 1955, 69 y.o.   MRN: [id number]  HPI  Patient presents to clinic today for her annual exam.  Flu: 11/2023 Tetanus: 12/2008 COVID: Pfizer x 4 Pneumovax: 12/2019 Prevnar: 11/2018 Shingrix : x 2 Pap smear: Hysterectomy Mammogram: 01/2021, double mastectomy Bone density: 12/2020 Colon screening: 03/2021 Vision screening: annually Dentist: biannually  Diet: She does eat meat. She consumes fruits and veggies. She does eat fried foods. She drinks mostly water. Exercise: None  Review of Systems     Past Medical History:  Diagnosis Date   Breast cancer (HCC) 01/2021   left breast IDC   Breast cancer, right breast (HCC) 12/2007   a. s/p chemo, radiation, and lumpectomy with negative sentinel lymph node biopsy    Chronic kidney disease 1year ago   Stage 3 kidney disease   Complication of anesthesia    woke up during colonoscopy   Depression    Diabetes mellitus without complication (HCC)    GERD (gastroesophageal reflux disease)    Hyperlipidemia    Hypertension    resistant, negative renal Dopplers and negative CT angiogram   Migraines    Obesity    Seizures (HCC) 3 to 4 yrs ago    Current Outpatient Medications  Medication Sig Dispense Refill   ACCU-CHEK GUIDE TEST test strip USE AS DIRECTED TWICE DAILY 200 strip 2   Accu-Chek Softclix Lancets lancets Use 2-3 times daily prn 100 each 12   amLODipine  (NORVASC ) 10 MG tablet Take 1 tablet (10 mg total) by mouth daily. 90 tablet 0   aspirin  EC 81 MG tablet Take 1 tablet (81 mg total) by mouth daily. Swallow whole. 30 tablet 12   Biotin w/ Vitamins C & E (HAIR SKIN & NAILS GUMMIES PO) Take by mouth.     Blood Glucose Monitoring Suppl (ACCU-CHEK GUIDE) w/Device KIT 1 kit by Does not apply route in the morning, at noon, and at bedtime. 1 kit 0   carvedilol  (COREG ) 12.5 MG tablet Take 1 tablet (12.5 mg total) by mouth in the morning and at bedtime. 90 tablet 0    doxycycline  (VIBRAMYCIN ) 100 MG capsule Take 1 capsule (100 mg total) by mouth 2 (two) times daily. One po bid x 7 days 14 capsule 0   fluticasone  (FLONASE ) 50 MCG/ACT nasal spray Place 2 sprays into both nostrils daily for 7 days. 1 g 0   gabapentin  (NEURONTIN ) 100 MG capsule Take 100 mg by mouth in the morning and at bedtime.     hydrALAZINE  (APRESOLINE ) 100 MG tablet Take 1 tablet (100 mg total) by mouth 3 (three) times daily. 300 tablet 1   irbesartan  (AVAPRO ) 300 MG tablet Take 1 tablet (300 mg total) by mouth daily. 100 tablet 2   letrozole  (FEMARA ) 2.5 MG tablet Take 1 tablet (2.5 mg total) by mouth daily. 90 tablet 3   lidocaine  (LIDODERM ) 5 % Place 1 patch onto the skin daily. Remove & Discard patch within 12 hours or as directed by MD 5 patch 0   metFORMIN  (GLUCOPHAGE ) 1000 MG tablet Take 1 tablet (1,000 mg total) by mouth 2 (two) times daily with a meal. 180 tablet 0   methocarbamol  (ROBAXIN ) 500 MG tablet Take 1 tablet (500 mg total) by mouth at bedtime as needed. 20 tablet 0   Multiple Vitamins-Minerals (ONE-A-DAY WOMENS VITACRAVES) CHEW Chew 1 tablet by mouth daily.      pravastatin  (PRAVACHOL ) 40 MG tablet Take 1  tablet (40 mg total) by mouth daily. 100 tablet 1   repaglinide  (PRANDIN ) 0.5 MG tablet Take 1 tablet (0.5 mg total) by mouth 3 (three) times daily before meals. 270 tablet 0   scopolamine  (TRANSDERM-SCOP) 1 MG/3DAYS Place 1 patch (1.5 mg total) onto the skin every 3 (three) days. For motion sickness. Start 2 hr before onset symptoms, up to 12 hr before 10 patch 0   No current facility-administered medications for this visit.    No Known Allergies  Family History  Problem Relation Age of Onset   Stroke Mother    Colon cancer Father 83   Heart disease Father    Asthma Father    Lung cancer Sister    Cancer Sister    Colon cancer Brother 69   Diabetes Brother    Breast cancer Niece        dx. <50   Breast cancer Niece        dx. <50   Breast cancer Niece         dx. >50   Diabetes Brother    Diabetes Brother    Esophageal cancer Neg Hx    Rectal cancer Neg Hx    Stomach cancer Neg Hx     Social History   Socioeconomic History   Marital status: Married    Spouse name: Sarabelle Genson   Number of children: 3   Years of education: 12+   Highest education level: Some college, no degree  Occupational History   Occupation: Disabled  Tobacco Use   Smoking status: Never   Smokeless tobacco: Never  Vaping Use   Vaping status: Never Used  Substance and Sexual Activity   Alcohol use: No    Alcohol/week: 0.0 standard drinks of alcohol   Drug use: No   Sexual activity: Yes  Other Topics Concern   Not on file  Social History Narrative   Lives at home with husband.   Caffeine  use: 1-2 drinks per month         Epworth Sleepiness Scale = 15 (as of 01/01/15)   Social Drivers of Health   Tobacco Use: Low Risk (12/01/2023)   Patient History    Smoking Tobacco Use: Never    Smokeless Tobacco Use: Never    Passive Exposure: Not on file  Financial Resource Strain: Low Risk (01/19/2024)   Overall Financial Resource Strain (CARDIA)    Difficulty of Paying Living Expenses: Not hard at all  Food Insecurity: No Food Insecurity (01/19/2024)   Epic    Worried About Radiation Protection Practitioner of Food in the Last Year: Never true    Ran Out of Food in the Last Year: Never true  Transportation Needs: No Transportation Needs (01/19/2024)   Epic    Lack of Transportation (Medical): No    Lack of Transportation (Non-Medical): No  Physical Activity: Insufficiently Active (01/19/2024)   Exercise Vital Sign    Days of Exercise per Week: 1 day    Minutes of Exercise per Session: 10 min  Stress: No Stress Concern Present (01/19/2024)   [employer] of Occupational Health - Occupational Stress Questionnaire    Feeling of Stress: Not at all  Social Connections: Socially Integrated (01/19/2024)   Social Connection and Isolation Panel    Frequency of Communication with Friends  and Family: More than three times a week    Frequency of Social Gatherings with Friends and Family: Once a week    Attends Religious Services: More than 4 times per year  Active Member of Clubs or Organizations: Yes    Attends Club or Organization Meetings: 1 to 4 times per year    Marital Status: Married  Catering Manager Violence: Not At Risk (08/10/2023)   Epic    Fear of Current or Ex-Partner: No    Emotionally Abused: No    Physically Abused: No    Sexually Abused: No  Depression (PHQ2-9): Medium Risk (12/01/2023)   Depression (PHQ2-9)    PHQ-2 Score: 10  Alcohol Screen: Low Risk (08/10/2023)   Alcohol Screen    Last Alcohol Screening Score (AUDIT): 0  Housing: Unknown (01/19/2024)   Epic    Unable to Pay for Housing in the Last Year: No    Number of Times Moved in the Last Year: Not on file    Homeless in the Last Year: No  Utilities: Not At Risk (08/10/2023)   Epic    Threatened with loss of utilities: No  Health Literacy: Adequate Health Literacy (08/10/2023)   B1300 Health Literacy    Frequency of need for help with medical instructions: Never     Constitutional: Patient reports rare headaches.  Denies fever, malaise, fatigue, or abrupt weight changes.  HEENT: Denies eye pain, eye redness, ear pain, ringing in the ears, wax buildup, runny nose, nasal congestion, bloody nose, or sore throat. Respiratory: Denies difficulty breathing, shortness of breath, cough or sputum production.   Cardiovascular: Denies chest pain, chest tightness, palpitations or swelling in the hands or feet.  Gastrointestinal: Denies abdominal pain, bloating, constipation, diarrhea or blood in the stool.  GU: Patient reports urge incontinence.  Denies urgency, frequency, pain with urination, burning sensation, blood in urine, odor or discharge. Musculoskeletal: Patient reports joint pain.  Denies decrease in range of motion, difficulty with gait, muscle pain or joint swelling.  Skin: Denies redness,  rashes, lesions or ulcercations.  Neurological: Denies dizziness, difficulty with memory, difficulty with speech or problems with balance and coordination.  Psych: Patient has a history of depression.  Denies anxiety, SI/HI.  No other specific complaints in a complete review of systems (except as listed in HPI above).  Objective:   Physical Exam BP 124/74 (BP Location: Left Arm, Patient Position: Sitting, Cuff Size: Large)   Ht 5' 5 (1.651 m)   Wt 207 lb 9.6 oz (94.2 kg)   BMI 34.55 kg/m     Wt Readings from Last 3 Encounters:  01/01/24 212 lb 6.4 oz (96.3 kg)  12/01/23 203 lb 9.6 oz (92.4 kg)  11/26/23 210 lb (95.3 kg)    General: Appears her stated age, obese, in NAD. Skin: Warm, dry and intact. No  ulcerations noted. HEENT: Head: normal shape and size; Eyes: sclera white, no icterus, conjunctiva pink, PERRLA and EOMs intact;  Neck:  Neck supple, trachea midline. No masses, lumps or thyromegaly present.  Cardiovascular: Normal rate and rhythm. S1,S2 noted.  No murmur, rubs or gallops noted. No JVD or BLE edema. No carotid bruits noted. Pulmonary/Chest: Normal effort and positive vesicular breath sounds. No respiratory distress. No wheezes, rales or ronchi noted.  Abdomen: Normal bowel sounds.  Musculoskeletal: Strength 5/5 BUE/BLE.  Gait slow and steady without device. Neurological: Alert and oriented. Cranial nerves II-XII grossly intact. Coordination normal.  Psychiatric: Mood and affect normal. Behavior is normal. Judgment and thought content normal.    BMET    Component Value Date/Time   NA 137 11/21/2023 2340   NA 146 (H) 01/20/2015 1457   NA 143 10/12/2013 1510   K 3.8 11/21/2023  2340   K 3.9 10/12/2013 1510   CL 102 11/21/2023 2340   CL 109 (H) 10/12/2013 1510   CO2 21 (L) 11/21/2023 2340   CO2 29 10/12/2013 1510   GLUCOSE 189 (H) 11/21/2023 2340   GLUCOSE 92 10/12/2013 1510   BUN 14 11/21/2023 2340   BUN 17 01/20/2015 1457   BUN 16 10/12/2013 1510    CREATININE 1.09 (H) 11/21/2023 2340   CREATININE 0.97 08/11/2023 0911   CALCIUM 9.0 11/21/2023 2340   CALCIUM 9.4 10/12/2013 1510   GFRNONAA 54 (L) 11/21/2023 2340   GFRNONAA 46 (L) 05/18/2023 1527   GFRNONAA 56 (L) 10/12/2013 1510   GFRAA >60 01/24/2018 2159   GFRAA >60 10/12/2013 1510    Lipid Panel     Component Value Date/Time   CHOL 183 08/11/2023 0911   CHOL 179 10/17/2014 1527   TRIG 92 08/11/2023 0911   HDL 80 08/11/2023 0911   HDL 59 10/17/2014 1527   CHOLHDL 2.3 08/11/2023 0911   VLDL 15.2 01/09/2020 0957   LDLCALC 84 08/11/2023 0911    CBC    Component Value Date/Time   WBC 12.1 (H) 11/21/2023 2340   RBC 4.00 11/21/2023 2340   HGB 11.8 (L) 11/21/2023 2340   HGB 12.2 05/18/2023 1527   HGB 13.5 10/12/2013 1510   HGB 12.8 08/13/2009 0946   HCT 35.5 (L) 11/21/2023 2340   HCT 40.5 10/12/2013 1510   HCT 36.8 08/13/2009 0946   PLT 227 11/21/2023 2340   PLT 257 05/18/2023 1527   PLT 278 10/12/2013 1510   PLT 271 08/13/2009 0946   MCV 88.8 11/21/2023 2340   MCV 87 10/12/2013 1510   MCV 87.0 08/13/2009 0946   MCH 29.5 11/21/2023 2340   MCHC 33.2 11/21/2023 2340   RDW 13.1 11/21/2023 2340   RDW 13.6 10/12/2013 1510   RDW 13.8 08/13/2009 0946   LYMPHSABS 1.9 05/18/2023 1527   LYMPHSABS 1.5 08/13/2009 0946   MONOABS 0.3 05/18/2023 1527   MONOABS 0.2 08/13/2009 0946   EOSABS 0.1 05/18/2023 1527   EOSABS 0.1 08/13/2009 0946   BASOSABS 0.0 05/18/2023 1527   BASOSABS 0.0 08/13/2009 0946    Hgb A1C Lab Results  Component Value Date   HGBA1C 5.9 (H) 08/11/2023            Assessment & Plan:   Preventative Health Maintenance:  Flu shot UTD She declines tetanus for financial reasons, advised if she gets bit or cut to go get this done at the pharmacy Encouraged her to get her COVID booster Pneumovax and Prevnar UTD Shingrix  vaccine UTD She no longer needs Pap smears Per oncology, she no longer needs mammograms due to bilateral mastectomy Bone  density ordered-she will call to schedule Colon screening UTD Encouraged her to consume a balanced diet and exercise regimen Advised her to see an eye doctor and dentist annually  We will check CBC, c-Met, lipid, A1c and urine microalbumin today  RTC in 6 months, follow-up chronic conditions Angeline Laura, NP   "

## 2024-01-23 ENCOUNTER — Ambulatory Visit: Payer: Self-pay | Admitting: Internal Medicine

## 2024-01-23 LAB — COMPREHENSIVE METABOLIC PANEL WITH GFR
AG Ratio: 1.9 (calc) (ref 1.0–2.5)
ALT: 11 U/L (ref 6–29)
AST: 12 U/L (ref 10–35)
Albumin: 4.4 g/dL (ref 3.6–5.1)
Alkaline phosphatase (APISO): 66 U/L (ref 37–153)
BUN/Creatinine Ratio: 19 (calc) (ref 6–22)
BUN: 19 mg/dL (ref 7–25)
CO2: 26 mmol/L (ref 20–32)
Calcium: 9.5 mg/dL (ref 8.6–10.4)
Chloride: 105 mmol/L (ref 98–110)
Creat: 1.01 mg/dL — ABNORMAL HIGH (ref 0.60–1.00)
Globulin: 2.3 g/dL (ref 1.9–3.7)
Glucose, Bld: 106 mg/dL — ABNORMAL HIGH (ref 65–99)
Potassium: 3.9 mmol/L (ref 3.5–5.3)
Sodium: 140 mmol/L (ref 135–146)
Total Bilirubin: 0.8 mg/dL (ref 0.2–1.2)
Total Protein: 6.7 g/dL (ref 6.1–8.1)
eGFR: 60 mL/min/1.73m2

## 2024-01-23 LAB — CBC
HCT: 38.2 % (ref 35.9–46.0)
Hemoglobin: 12.1 g/dL (ref 11.7–15.5)
MCH: 28.7 pg (ref 27.0–33.0)
MCHC: 31.7 g/dL (ref 31.6–35.4)
MCV: 90.7 fL (ref 81.4–101.7)
MPV: 9.8 fL (ref 7.5–12.5)
Platelets: 281 Thousand/uL (ref 140–400)
RBC: 4.21 Million/uL (ref 3.80–5.10)
RDW: 13.2 % (ref 11.0–15.0)
WBC: 4 Thousand/uL (ref 3.8–10.8)

## 2024-01-23 LAB — HEMOGLOBIN A1C
Hgb A1c MFr Bld: 5.9 % — ABNORMAL HIGH
Mean Plasma Glucose: 123 mg/dL
eAG (mmol/L): 6.8 mmol/L

## 2024-01-23 LAB — MICROALBUMIN / CREATININE URINE RATIO
Creatinine, Urine: 225 mg/dL (ref 20–275)
Microalb Creat Ratio: 6 mg/g{creat}
Microalb, Ur: 1.4 mg/dL

## 2024-01-23 LAB — LIPID PANEL
Cholesterol: 153 mg/dL
HDL: 71 mg/dL
LDL Cholesterol (Calc): 68 mg/dL
Non-HDL Cholesterol (Calc): 82 mg/dL
Total CHOL/HDL Ratio: 2.2 (calc)
Triglycerides: 68 mg/dL

## 2024-01-26 ENCOUNTER — Encounter: Payer: Self-pay | Admitting: Hematology and Oncology

## 2024-01-29 ENCOUNTER — Encounter: Payer: Self-pay | Admitting: Internal Medicine

## 2024-01-29 DIAGNOSIS — E1165 Type 2 diabetes mellitus with hyperglycemia: Secondary | ICD-10-CM

## 2024-02-12 ENCOUNTER — Other Ambulatory Visit

## 2024-02-16 ENCOUNTER — Encounter: Payer: Self-pay | Admitting: Gastroenterology

## 2024-02-21 ENCOUNTER — Other Ambulatory Visit

## 2024-02-21 ENCOUNTER — Inpatient Hospital Stay: Admission: RE | Admit: 2024-02-21 | Discharge: 2024-02-21 | Attending: Internal Medicine | Admitting: Internal Medicine

## 2024-02-21 DIAGNOSIS — Z780 Asymptomatic menopausal state: Secondary | ICD-10-CM

## 2024-02-21 LAB — OPHTHALMOLOGY REPORT-SCANNED

## 2024-08-12 ENCOUNTER — Ambulatory Visit: Admitting: Internal Medicine

## 2024-08-16 ENCOUNTER — Ambulatory Visit

## 2024-08-21 ENCOUNTER — Ambulatory Visit

## 2024-12-31 ENCOUNTER — Inpatient Hospital Stay: Payer: Self-pay | Attending: Hematology and Oncology | Admitting: Hematology and Oncology
